# Patient Record
Sex: Male | Born: 1940 | Race: White | Hispanic: No | Marital: Married | Smoking: Former smoker
Health system: Southern US, Community
[De-identification: ages and names within clinical notes are randomized; demographics above are authoritative.]

## PROBLEM LIST (undated history)

## (undated) DIAGNOSIS — F419 Anxiety disorder, unspecified: Secondary | ICD-10-CM

## (undated) DIAGNOSIS — D869 Sarcoidosis, unspecified: Secondary | ICD-10-CM

## (undated) DIAGNOSIS — K4090 Unilateral inguinal hernia, without obstruction or gangrene, not specified as recurrent: Secondary | ICD-10-CM

## (undated) DIAGNOSIS — K635 Polyp of colon: Secondary | ICD-10-CM

## (undated) DIAGNOSIS — I2510 Atherosclerotic heart disease of native coronary artery without angina pectoris: Secondary | ICD-10-CM

## (undated) DIAGNOSIS — R0600 Dyspnea, unspecified: Secondary | ICD-10-CM

## (undated) DIAGNOSIS — Z9289 Personal history of other medical treatment: Secondary | ICD-10-CM

## (undated) DIAGNOSIS — C3490 Malignant neoplasm of unspecified part of unspecified bronchus or lung: Secondary | ICD-10-CM

## (undated) DIAGNOSIS — F4310 Post-traumatic stress disorder, unspecified: Secondary | ICD-10-CM

## (undated) DIAGNOSIS — J310 Chronic rhinitis: Secondary | ICD-10-CM

## (undated) DIAGNOSIS — R739 Hyperglycemia, unspecified: Secondary | ICD-10-CM

## (undated) DIAGNOSIS — F329 Major depressive disorder, single episode, unspecified: Secondary | ICD-10-CM

## (undated) DIAGNOSIS — K219 Gastro-esophageal reflux disease without esophagitis: Secondary | ICD-10-CM

## (undated) DIAGNOSIS — I4892 Unspecified atrial flutter: Secondary | ICD-10-CM

## (undated) DIAGNOSIS — J90 Pleural effusion, not elsewhere classified: Secondary | ICD-10-CM

## (undated) DIAGNOSIS — G8929 Other chronic pain: Secondary | ICD-10-CM

## (undated) DIAGNOSIS — G4733 Obstructive sleep apnea (adult) (pediatric): Secondary | ICD-10-CM

## (undated) DIAGNOSIS — J449 Chronic obstructive pulmonary disease, unspecified: Secondary | ICD-10-CM

## (undated) DIAGNOSIS — I7090 Unspecified atherosclerosis: Secondary | ICD-10-CM

## (undated) DIAGNOSIS — I480 Paroxysmal atrial fibrillation: Secondary | ICD-10-CM

## (undated) DIAGNOSIS — K449 Diaphragmatic hernia without obstruction or gangrene: Secondary | ICD-10-CM

## (undated) DIAGNOSIS — T380X5A Adverse effect of glucocorticoids and synthetic analogues, initial encounter: Secondary | ICD-10-CM

## (undated) DIAGNOSIS — M545 Low back pain, unspecified: Secondary | ICD-10-CM

## (undated) DIAGNOSIS — F32A Depression, unspecified: Secondary | ICD-10-CM

## (undated) DIAGNOSIS — M5090 Cervical disc disorder, unspecified, unspecified cervical region: Secondary | ICD-10-CM

## (undated) DIAGNOSIS — E785 Hyperlipidemia, unspecified: Secondary | ICD-10-CM

## (undated) DIAGNOSIS — R209 Unspecified disturbances of skin sensation: Secondary | ICD-10-CM

## (undated) DIAGNOSIS — I10 Essential (primary) hypertension: Secondary | ICD-10-CM

## (undated) DIAGNOSIS — Z9981 Dependence on supplemental oxygen: Secondary | ICD-10-CM

## (undated) DIAGNOSIS — J189 Pneumonia, unspecified organism: Secondary | ICD-10-CM

## (undated) DIAGNOSIS — I5042 Chronic combined systolic (congestive) and diastolic (congestive) heart failure: Secondary | ICD-10-CM

## (undated) DIAGNOSIS — S065X9A Traumatic subdural hemorrhage with loss of consciousness of unspecified duration, initial encounter: Secondary | ICD-10-CM

## (undated) DIAGNOSIS — M1990 Unspecified osteoarthritis, unspecified site: Secondary | ICD-10-CM

## (undated) DIAGNOSIS — A419 Sepsis, unspecified organism: Secondary | ICD-10-CM

## (undated) HISTORY — DX: Cervical disc disorder, unspecified, unspecified cervical region: M50.90

## (undated) HISTORY — DX: Chronic rhinitis: J31.0

## (undated) HISTORY — DX: Hyperlipidemia, unspecified: E78.5

## (undated) HISTORY — DX: Unspecified atherosclerosis: I70.90

## (undated) HISTORY — DX: Chronic obstructive pulmonary disease, unspecified: J44.9

## (undated) HISTORY — DX: Depression, unspecified: F32.A

## (undated) HISTORY — DX: Sarcoidosis, unspecified: D86.9

## (undated) HISTORY — DX: Gastro-esophageal reflux disease without esophagitis: K21.9

## (undated) HISTORY — PX: KNEE ARTHROSCOPY: SUR90

## (undated) HISTORY — DX: Sepsis, unspecified organism: A41.9

## (undated) HISTORY — DX: Post-traumatic stress disorder, unspecified: F43.10

## (undated) HISTORY — DX: Unilateral inguinal hernia, without obstruction or gangrene, not specified as recurrent: K40.90

## (undated) HISTORY — DX: Obstructive sleep apnea (adult) (pediatric): G47.33

## (undated) HISTORY — PX: INGUINAL HERNIA REPAIR: SUR1180

## (undated) HISTORY — DX: Traumatic subdural hemorrhage with loss of consciousness of unspecified duration, initial encounter: S06.5X9A

## (undated) HISTORY — PX: BACK SURGERY: SHX140

## (undated) HISTORY — DX: Diaphragmatic hernia without obstruction or gangrene: K44.9

## (undated) HISTORY — DX: Polyp of colon: K63.5

## (undated) HISTORY — DX: Dyspnea, unspecified: R06.00

## (undated) HISTORY — DX: Major depressive disorder, single episode, unspecified: F32.9

---

## 2001-10-28 ENCOUNTER — Ambulatory Visit (HOSPITAL_COMMUNITY): Admission: RE | Admit: 2001-10-28 | Discharge: 2001-10-28 | Payer: Self-pay | Admitting: Pulmonary Disease

## 2001-10-28 ENCOUNTER — Encounter: Payer: Self-pay | Admitting: Pulmonary Disease

## 2001-12-04 ENCOUNTER — Ambulatory Visit (HOSPITAL_COMMUNITY): Admission: RE | Admit: 2001-12-04 | Discharge: 2001-12-04 | Payer: Self-pay | Admitting: Internal Medicine

## 2002-03-18 ENCOUNTER — Emergency Department (HOSPITAL_COMMUNITY): Admission: EM | Admit: 2002-03-18 | Discharge: 2002-03-19 | Payer: Self-pay | Admitting: Emergency Medicine

## 2002-03-19 ENCOUNTER — Encounter: Payer: Self-pay | Admitting: Emergency Medicine

## 2002-12-10 ENCOUNTER — Encounter (INDEPENDENT_AMBULATORY_CARE_PROVIDER_SITE_OTHER): Payer: Self-pay | Admitting: *Deleted

## 2002-12-10 ENCOUNTER — Encounter (INDEPENDENT_AMBULATORY_CARE_PROVIDER_SITE_OTHER): Payer: Self-pay | Admitting: Specialist

## 2002-12-10 ENCOUNTER — Ambulatory Visit (HOSPITAL_COMMUNITY): Admission: RE | Admit: 2002-12-10 | Discharge: 2002-12-10 | Payer: Self-pay | Admitting: *Deleted

## 2004-05-24 ENCOUNTER — Ambulatory Visit: Payer: Self-pay | Admitting: Pulmonary Disease

## 2004-07-10 ENCOUNTER — Ambulatory Visit: Payer: Self-pay | Admitting: Pulmonary Disease

## 2004-08-24 ENCOUNTER — Ambulatory Visit: Payer: Self-pay | Admitting: Pulmonary Disease

## 2004-11-23 ENCOUNTER — Ambulatory Visit: Payer: Self-pay | Admitting: Pulmonary Disease

## 2005-04-29 ENCOUNTER — Ambulatory Visit: Payer: Self-pay | Admitting: Pulmonary Disease

## 2005-07-23 ENCOUNTER — Ambulatory Visit: Payer: Self-pay | Admitting: Pulmonary Disease

## 2005-09-01 ENCOUNTER — Encounter: Admission: RE | Admit: 2005-09-01 | Discharge: 2005-09-01 | Payer: Self-pay | Admitting: Orthopaedic Surgery

## 2005-10-25 ENCOUNTER — Ambulatory Visit: Payer: Self-pay | Admitting: Pulmonary Disease

## 2005-12-17 ENCOUNTER — Ambulatory Visit: Payer: Self-pay | Admitting: Pulmonary Disease

## 2006-03-17 ENCOUNTER — Ambulatory Visit: Payer: Self-pay | Admitting: Pulmonary Disease

## 2006-04-13 ENCOUNTER — Emergency Department (HOSPITAL_COMMUNITY): Admission: EM | Admit: 2006-04-13 | Discharge: 2006-04-13 | Payer: Self-pay | Admitting: Emergency Medicine

## 2006-05-14 ENCOUNTER — Ambulatory Visit (HOSPITAL_COMMUNITY): Admission: RE | Admit: 2006-05-14 | Discharge: 2006-05-14 | Payer: Self-pay | Admitting: Neurosurgery

## 2006-06-11 ENCOUNTER — Ambulatory Visit: Payer: Self-pay | Admitting: Pulmonary Disease

## 2006-10-13 ENCOUNTER — Ambulatory Visit: Payer: Self-pay | Admitting: Pulmonary Disease

## 2006-10-27 ENCOUNTER — Ambulatory Visit (HOSPITAL_BASED_OUTPATIENT_CLINIC_OR_DEPARTMENT_OTHER): Admission: RE | Admit: 2006-10-27 | Discharge: 2006-10-27 | Payer: Self-pay | Admitting: Pulmonary Disease

## 2006-11-03 ENCOUNTER — Ambulatory Visit: Payer: Self-pay | Admitting: Pulmonary Disease

## 2006-11-10 ENCOUNTER — Ambulatory Visit: Payer: Self-pay | Admitting: Pulmonary Disease

## 2007-02-18 ENCOUNTER — Ambulatory Visit: Payer: Self-pay | Admitting: Pulmonary Disease

## 2007-05-11 DIAGNOSIS — G4700 Insomnia, unspecified: Secondary | ICD-10-CM | POA: Insufficient documentation

## 2007-05-11 DIAGNOSIS — G4733 Obstructive sleep apnea (adult) (pediatric): Secondary | ICD-10-CM | POA: Insufficient documentation

## 2007-05-11 DIAGNOSIS — J449 Chronic obstructive pulmonary disease, unspecified: Secondary | ICD-10-CM | POA: Insufficient documentation

## 2007-06-22 ENCOUNTER — Ambulatory Visit: Payer: Self-pay | Admitting: Pulmonary Disease

## 2007-06-22 DIAGNOSIS — J310 Chronic rhinitis: Secondary | ICD-10-CM

## 2007-11-25 ENCOUNTER — Encounter: Payer: Self-pay | Admitting: Pulmonary Disease

## 2007-12-01 ENCOUNTER — Ambulatory Visit: Payer: Self-pay | Admitting: Pulmonary Disease

## 2008-03-15 ENCOUNTER — Telehealth (INDEPENDENT_AMBULATORY_CARE_PROVIDER_SITE_OTHER): Payer: Self-pay | Admitting: *Deleted

## 2008-05-03 ENCOUNTER — Ambulatory Visit: Payer: Self-pay | Admitting: Pulmonary Disease

## 2008-05-09 ENCOUNTER — Telehealth: Payer: Self-pay | Admitting: Pulmonary Disease

## 2008-05-24 ENCOUNTER — Encounter (HOSPITAL_COMMUNITY): Admission: RE | Admit: 2008-05-24 | Discharge: 2008-06-23 | Payer: Self-pay | Admitting: Pulmonary Disease

## 2008-06-24 ENCOUNTER — Encounter (HOSPITAL_COMMUNITY): Admission: RE | Admit: 2008-06-24 | Discharge: 2008-08-24 | Payer: Self-pay | Admitting: Pulmonary Disease

## 2008-07-27 ENCOUNTER — Telehealth (INDEPENDENT_AMBULATORY_CARE_PROVIDER_SITE_OTHER): Payer: Self-pay | Admitting: *Deleted

## 2008-08-11 ENCOUNTER — Encounter: Payer: Self-pay | Admitting: Pulmonary Disease

## 2008-08-12 ENCOUNTER — Encounter: Payer: Self-pay | Admitting: Pulmonary Disease

## 2008-08-25 ENCOUNTER — Encounter (HOSPITAL_COMMUNITY): Admission: RE | Admit: 2008-08-25 | Discharge: 2008-11-23 | Payer: Self-pay | Admitting: Pulmonary Disease

## 2008-09-22 ENCOUNTER — Encounter: Payer: Self-pay | Admitting: Pulmonary Disease

## 2008-10-17 ENCOUNTER — Ambulatory Visit: Payer: Self-pay | Admitting: Pulmonary Disease

## 2008-11-18 ENCOUNTER — Encounter: Payer: Self-pay | Admitting: Pulmonary Disease

## 2008-11-24 ENCOUNTER — Encounter (HOSPITAL_COMMUNITY): Admission: RE | Admit: 2008-11-24 | Discharge: 2009-02-22 | Payer: Self-pay | Admitting: Pulmonary Disease

## 2008-11-24 ENCOUNTER — Encounter: Payer: Self-pay | Admitting: Pulmonary Disease

## 2008-11-28 ENCOUNTER — Telehealth: Payer: Self-pay | Admitting: Pulmonary Disease

## 2008-12-05 ENCOUNTER — Encounter: Payer: Self-pay | Admitting: Pulmonary Disease

## 2009-01-16 ENCOUNTER — Ambulatory Visit: Payer: Self-pay | Admitting: Pulmonary Disease

## 2009-02-23 ENCOUNTER — Encounter (HOSPITAL_COMMUNITY): Admission: RE | Admit: 2009-02-23 | Discharge: 2009-05-24 | Payer: Self-pay | Admitting: Pulmonary Disease

## 2009-03-07 ENCOUNTER — Emergency Department (HOSPITAL_COMMUNITY): Admission: EM | Admit: 2009-03-07 | Discharge: 2009-03-07 | Payer: Self-pay | Admitting: Emergency Medicine

## 2009-03-22 ENCOUNTER — Ambulatory Visit: Payer: Self-pay | Admitting: Pulmonary Disease

## 2009-03-30 ENCOUNTER — Telehealth (INDEPENDENT_AMBULATORY_CARE_PROVIDER_SITE_OTHER): Payer: Self-pay | Admitting: *Deleted

## 2009-04-24 DIAGNOSIS — S065X9A Traumatic subdural hemorrhage with loss of consciousness of unspecified duration, initial encounter: Secondary | ICD-10-CM

## 2009-04-24 DIAGNOSIS — S065XAA Traumatic subdural hemorrhage with loss of consciousness status unknown, initial encounter: Secondary | ICD-10-CM

## 2009-04-24 HISTORY — PX: BRAIN SURGERY: SHX531

## 2009-04-24 HISTORY — DX: Traumatic subdural hemorrhage with loss of consciousness of unspecified duration, initial encounter: S06.5X9A

## 2009-04-24 HISTORY — DX: Traumatic subdural hemorrhage with loss of consciousness status unknown, initial encounter: S06.5XAA

## 2009-05-09 ENCOUNTER — Encounter: Admission: RE | Admit: 2009-05-09 | Discharge: 2009-05-09 | Payer: Self-pay | Admitting: Internal Medicine

## 2009-05-09 ENCOUNTER — Ambulatory Visit: Payer: Self-pay | Admitting: Internal Medicine

## 2009-05-09 ENCOUNTER — Inpatient Hospital Stay (HOSPITAL_COMMUNITY): Admission: EM | Admit: 2009-05-09 | Discharge: 2009-05-16 | Payer: Self-pay | Admitting: Emergency Medicine

## 2009-05-24 ENCOUNTER — Encounter: Admission: RE | Admit: 2009-05-24 | Discharge: 2009-05-24 | Payer: Self-pay | Admitting: Neurosurgery

## 2009-05-25 ENCOUNTER — Encounter: Payer: Self-pay | Admitting: Pulmonary Disease

## 2009-06-02 ENCOUNTER — Ambulatory Visit: Payer: Self-pay | Admitting: Pulmonary Disease

## 2009-06-06 ENCOUNTER — Encounter: Admission: RE | Admit: 2009-06-06 | Discharge: 2009-06-06 | Payer: Self-pay | Admitting: Neurosurgery

## 2009-06-08 ENCOUNTER — Encounter: Payer: Self-pay | Admitting: Pulmonary Disease

## 2009-06-24 ENCOUNTER — Encounter (HOSPITAL_COMMUNITY): Admission: RE | Admit: 2009-06-24 | Discharge: 2009-09-22 | Payer: Self-pay | Admitting: Pulmonary Disease

## 2009-07-06 ENCOUNTER — Encounter: Admission: RE | Admit: 2009-07-06 | Discharge: 2009-07-06 | Payer: Self-pay | Admitting: Neurosurgery

## 2009-08-28 ENCOUNTER — Encounter: Payer: Self-pay | Admitting: Pulmonary Disease

## 2009-09-18 ENCOUNTER — Ambulatory Visit: Payer: Self-pay | Admitting: Pulmonary Disease

## 2009-09-20 ENCOUNTER — Telehealth (INDEPENDENT_AMBULATORY_CARE_PROVIDER_SITE_OTHER): Payer: Self-pay | Admitting: *Deleted

## 2009-09-23 ENCOUNTER — Encounter (HOSPITAL_COMMUNITY): Admission: RE | Admit: 2009-09-23 | Discharge: 2009-12-22 | Payer: Self-pay | Admitting: Pulmonary Disease

## 2009-12-19 ENCOUNTER — Encounter: Admission: RE | Admit: 2009-12-19 | Discharge: 2009-12-19 | Payer: Self-pay | Admitting: Internal Medicine

## 2009-12-23 ENCOUNTER — Encounter (HOSPITAL_COMMUNITY): Admission: RE | Admit: 2009-12-23 | Discharge: 2010-03-23 | Payer: Self-pay | Admitting: Pulmonary Disease

## 2009-12-26 ENCOUNTER — Encounter: Admission: RE | Admit: 2009-12-26 | Discharge: 2009-12-26 | Payer: Self-pay | Admitting: Internal Medicine

## 2009-12-26 ENCOUNTER — Other Ambulatory Visit: Admission: RE | Admit: 2009-12-26 | Discharge: 2009-12-26 | Payer: Self-pay | Admitting: Interventional Radiology

## 2010-01-11 ENCOUNTER — Encounter: Payer: Self-pay | Admitting: Pulmonary Disease

## 2010-02-22 ENCOUNTER — Encounter: Payer: Self-pay | Admitting: Pulmonary Disease

## 2010-03-16 ENCOUNTER — Ambulatory Visit: Payer: Self-pay | Admitting: Pulmonary Disease

## 2010-04-02 ENCOUNTER — Telehealth (INDEPENDENT_AMBULATORY_CARE_PROVIDER_SITE_OTHER): Payer: Self-pay | Admitting: *Deleted

## 2010-07-24 NOTE — Miscellaneous (Signed)
Summary: Pulmonary/[place]  [facility]   Imported By: Sherian Rein 09/14/2009 12:01:53  _____________________________________________________________________  External Attachment:    Type:   Image     Comment:   External Document

## 2010-07-24 NOTE — Progress Notes (Signed)
Summary: rx correction  Phone Note Call from Patient Call back at Home Phone [phone]   Caller: Patient Call For: sood Summary of Call: pt states that the rx for symbicort is incorrect (pt was seen yesterday).  says he has always taken 2 puffs in the am and 2 puffs in the pm. says dr Craige Cotta didn't mention changing this. requests that this be corrected asap. same pharmacy. Initial call taken by: Tivis Ringer, CNA,  September 20, 2009 4:08 PM  Follow-up for Phone Call        pt states he recently saw [facility] 09-18-2009.  Rx was sent for Symbicort but directions state to take 2 puffs at bedtime.  Pt states he has always taken 2 puffs two times a day.  Please advise.  Thank you.Arman Filter LPN  September 20, 2009 4:21 PM   Additional Follow-up for Phone Call Additional follow up Details #1::        In Sept. 2010 he was on study medication for his COPD.  At that time he was on symbicort two puffs at bedtime, spiriva once daily, and study medication.    If he is using symbicort two puffs two times a day now that is fine, as he has improvement in his breathing.  Please make correction in his medication list, and inform patient that his medication list has been corrected. Additional Follow-up by: Coralyn Helling MD,  September 21, 2009 4:26 PM     Appended Document: rx correction Medications Added SYMBICORT 160-4.5 MCG/ACT AERO (BUDESONIDE-FORMOTEROL FUMARATE) two puffs twice daily          Clinical Lists Changes  Medications: Changed medication from SYMBICORT 160-4.5 MCG/ACT AERO (BUDESONIDE-FORMOTEROL FUMARATE) two puffs at bedtime to SYMBICORT 160-4.5 MCG/ACT AERO (BUDESONIDE-FORMOTEROL FUMARATE) two puffs twice daily - Signed Rx of SYMBICORT 160-4.5 MCG/ACT AERO (BUDESONIDE-FORMOTEROL FUMARATE) two puffs twice daily;  #3 x 3;  Signed;  Entered by: Carron Curie CMA;  Authorized by: Coralyn Helling MD;  Method used: [facility]  [address] [phone]*, [address]  [place], [place]  [address], Ph: [phone] or [phone], Fax: [phone]    Prescriptions: SYMBICORT 160-4.5 MCG/ACT AERO (BUDESONIDE-FORMOTEROL FUMARATE) two puffs twice daily  #3 x 3   Entered by:   Carron Curie CMA   Authorized by:   Coralyn Helling MD   Signed by:   Carron Curie CMA on 09/21/2009   Method used:   [facility]        [facility]  [address] [phone]* (retail)       [address]       [place], [place]  [address]       Ph: [phone] or [phone]       Fax: [phone]   RxID:   [id number]   pt advised that symbicort should be 2 puffs twice daily. New rx sent to pharmacy. Carron Curie CMA  September 21, 2009 4:46 PM

## 2010-07-24 NOTE — Assessment & Plan Note (Signed)
Summary: f/u ///kp   Visit Type:  Follow-up Copy to:  Dr. Romeo Apple Primary Ivylynn Hoppes/Referring Carmelle Bamberg:  Dr. Darcus Austin  CC:  COPD...the patient c/o cough with yellow mucus...increased sob...headaches.  History of Present Illness: I saw Travis Ferguson in f/u today for his COPD, insomnia, and mild OSA.  Overall he has been doing okay.  He has a cough with clear sputum.  He still gets occasional headaches.  He has also developed a tremor in his left hand.  He is no longer going to pulmonary rehab due to scheduling conflicts.  There has been on change to his sleeping pattern.  Current Medications (verified): 1)  Symbicort 160-4.5 Mcg/act Aero (Budesonide-Formoterol Fumarate) .... Two Puffs Twice Daily 2)  Proventil Hfa 108 (90 Base) Mcg/act  Aers (Albuterol Sulfate) .[name].. 1-2 Puffs Every 4-6 Hours As Needed 3)  Spiriva Handihaler 18 Mcg  Caps (Tiotropium Bromide Monohydrate) .[name].. 1 Capsule Once Daily 4)  Fluoxetine Hcl 40 Mg Caps (Fluoxetine Hcl) .... Take 1 Tablet By Mouth Once A Day 5)  Vytorin 10-40 Mg  Tabs (Ezetimibe-Simvastatin) .... Take 1 Tablet By Mouth Once A Day 6)  Fish Oil   Oil (Fish Oil) .... Take 3600mg   Tablet By Mouth Once A Day 7)  Metoprolol Succinate 25 Mg  Tb24 (Metoprolol Succinate) .... Take 1 Tablet By Mouth Once A Day 8)  Mucinex 600 Mg  Tb12 (Guaifenesin) .... As Needed 9)  Tylenol Extra Strength 500 Mg Tabs (Acetaminophen) .... Take 1 Tablet By Mouth Every 6 Hours As Needed 10)  Multivitamins   Tabs (Multiple Vitamin) .... Take 1 Tablet By Mouth Once A Day 11)  Losartan Potassium 25 Mg Tabs (Losartan Potassium) .... 1/2 By Mouth Daily  Allergies (verified): No Known Drug Allergies  Past History:  Past Medical History: Reviewed history from 06/02/2009 and no changes required. COPD Depression PTSD Mild OSA      - PSG 05/08 AHI 8 Migraines GERD Dyslipidemia Rhinitis Cervical disk disease Subdural hematoma Nov. 2010  Past Surgical History: Reviewed history  from 06/02/2009 and no changes required. Rt frontal craniotomy Nov 2010  Review of Systems       The patient complains of shortness of breath with activity, productive cough, non-productive cough, and headaches.  The patient denies shortness of breath at rest, coughing up blood, chest pain, abdominal pain, difficulty swallowing, sore throat, nasal congestion/difficulty breathing through nose, hand/feet swelling, rash, change in color of mucus, and fever.    Vital Signs:  Patient profile:   70 year old male Height:      72 inches (182.88 cm) Weight:      262 pounds (119.09 kg) BMI:     35.66 O2 Sat:      95 % on Room air Temp:     98.1 degrees F (36.72 degrees C) oral Pulse rate:   45 / minute BP sitting:   118 / 62  (left arm) Cuff size:   regular  Vitals Entered By: Michel Bickers CMA (March 16, 2010 4:54 PM)  O2 Sat at Rest %:  95 O2 Flow:  Room air CC: COPD...the patient c/o cough with yellow mucus...increased sob...headaches Comments Medications reviewed with patient Daytime phone verified. Michel Bickers [facility]  March 16, 2010 4:55 PM   Physical Exam  General:  normal appearance and obese.   Nose:  clear drainage, no tenderness Mouth:  no oral lesion Neck:  no JVD.   Lungs:  Decreased breath sounds, no wheezing, no dullness Heart:  regular rhythm, normal  rate, and no murmurs.   Extremities:  no edema Neurologic:  normal CN II-XII and strength normal.   Cervical Nodes:  no significant adenopathy   Impression & Recommendations:  Problem # 1:  COPD (ICD-496)  He is to continue his current inhaler regimen.  Will determine if he can restart pulmonary rehab maintenance program.  Administered flu shot today.  Problem # 2:  CHRONIC RHINITIS (ICD-472.0)  Stable.  Problem # 3:  OBSTRUCTIVE SLEEP APNEA (ICD-327.23)  Continue clinical monitoring.  Complete Medication List: 1)  Symbicort 160-4.5 Mcg/act Aero (Budesonide-formoterol fumarate) .... Two puffs twice  daily 2)  Proventil Hfa 108 (90 Base) Mcg/act Aers (Albuterol sulfate) .[name].. 1-2 puffs every 4-6 hours as needed 3)  Spiriva Handihaler 18 Mcg Caps (Tiotropium bromide monohydrate) .[name].. 1 capsule once daily 4)  Fluoxetine Hcl 40 Mg Caps (Fluoxetine hcl) .... Take 1 tablet by mouth once a day 5)  Vytorin 10-40 Mg Tabs (Ezetimibe-simvastatin) .... Take 1 tablet by mouth once a day 6)  Fish Oil Oil (Fish oil) .... Take 3600mg   tablet by mouth once a day 7)  Metoprolol Succinate 25 Mg Tb24 (Metoprolol succinate) .... Take 1 tablet by mouth once a day 8)  Mucinex 600 Mg Tb12 (Guaifenesin) .... As needed 9)  Tylenol Extra Strength 500 Mg Tabs (Acetaminophen) .... Take 1 tablet by mouth every 6 hours as needed 10)  Multivitamins Tabs (Multiple vitamin) .... Take 1 tablet by mouth once a day 11)  Losartan Potassium 25 Mg Tabs (Losartan potassium) .... 1/2 by mouth daily  Other Orders: Influenza Vaccine MCR (62952) Est. Patient Level III (84132)  Patient Instructions: 1)  Follow up in 4 months   Immunizations Administered:  Influenza Vaccine # 1:    Vaccine Type: Fluvax MCR    Site: left deltoid    Mfr: [employer]    Dose: 0.5 ml    Route: IM    Given by: Zackery Barefoot CMA    Exp. Date: 12/22/2010    Lot #: [id number]    VIS given: 01/16/10 version given March 16, 2010.  Flu Vaccine Consent Questions:    Do you have a history of severe allergic reactions to this vaccine? no    Any prior history of allergic reactions to egg and/or gelatin? no    Do you have a sensitivity to the preservative Thimersol? no    Do you have a past history of Guillan-Barre Syndrome? no    Do you currently have an acute febrile illness? no    Have you ever had a severe reaction to latex? no    Vaccine information given and explained to patient? yes

## 2010-07-24 NOTE — Procedures (Signed)
Summary: Colonoscopy   Colonoscopy  Procedure date:  12/10/2002  Findings:      Location:  [facility].    Procedures Next Due Date:    Colonoscopy: 12/2007 NAME:  Travis Ferguson, Travis Ferguson                          ACCOUNT NO.:  [id number]   MEDICAL RECORD NO.:  [id number]                   PATIENT TYPE:  AMB   LOCATION:  ENDO                                 FACILITY:  [facility]   PHYSICIAN:  Georgiana Spinner, M.D.                 DATE OF BIRTH:  [birth year]   DATE OF PROCEDURE:  12/10/2002  DATE OF DISCHARGE:                                 OPERATIVE REPORT   PROCEDURE:  Colonoscopy.   INDICATIONS:  Colon polyps.   ANESTHESIA:  1. Demerol 20 mg.  2. Versed 2 mg.   DESCRIPTION OF PROCEDURE:  With the patient mildly sedated in the left  lateral decubitus position, the Olympus videoscopic colonoscope was inserted  in the rectum and passed under direct vision to the cecum, identified by the  ileocecal valve and appendiceal orifice, both of which were photographed.  From this point, the colonoscope was slowly withdrawn, taking  circumferential views of the entire colonic mucosa, stopping only to  photograph diverticula seen along the way in the sigmoid colon until we  reached the rectum which appeared normal on direct and retroflexed view.  The endoscope was straightened and withdrawn.  The patient's vital signs and  pulse oximeter remained stable.  The patient tolerated the procedure well  without apparent complications.   FINDINGS:  Diverticulosis of sigmoid colon.  Otherwise, an unremarkable  exam.   PLAN:  Possibly repeat examination in five years.                                               Georgiana Spinner, M.D.    GMO/MEDQ  D:  12/10/2002  T:  12/10/2002  Job:  [id number]   cc:   Loraine Leriche A. Waynard Edwards, M.D.  [address]  [place]  [place] [address]  Fax: [phone]

## 2010-07-24 NOTE — Miscellaneous (Signed)
Summary: Pulmonary/[place]  Pulmonary/   Imported By: Sherian Rein 01/18/2010 08:11:50  _____________________________________________________________________  External Attachment:    Type:   Image     Comment:   External Document

## 2010-07-24 NOTE — Procedures (Signed)
Summary: EGD & Pathology   EGD  Procedure date:  12/10/2002  Findings:      Location: [facility]   NAME:  Travis Ferguson, Travis Ferguson                          ACCOUNT NO.:  [id number]   MEDICAL RECORD NO.:  [id number]                   PATIENT TYPE:  AMB   LOCATION:  ENDO                                 FACILITY:  [facility]   PHYSICIAN:  Georgiana Spinner, M.D.                 DATE OF BIRTH:  Nov 03, 1940   DATE OF PROCEDURE:  12/10/2002  DATE OF DISCHARGE:                                 OPERATIVE REPORT   PROCEDURE:  Upper endoscopy.   INDICATIONS:  GERD.   ANESTHESIA:  1. Demerol 60 mg.  2. Versed 6 mg.   DESCRIPTION OF PROCEDURE:  With patient mildly sedated in the left lateral  decubitus position, the Olympus videoscopic endoscope was inserted in the  mouth, passed under direct vision through the esophagus, which showed clear  changes of Barrett's that were photographed.  We entered into the stomach.  Fundus, body, antrum, duodenal bulb, second portion of duodenum all appeared  normal.  From this point, the endoscope was slowly withdrawn, taking  circumferential views of the duodenal mucosa until the endoscope then pulled  back into the stomach, placed in retroflexion to view the stomach from  below.  The endoscope was then straightened and withdrawn, taking  circumferential views of the remaining gastric and esophageal mucosa,  stopping in the distal esophagus to photograph and biopsy the areas of  Barrett's.  The patient's vital signs and pulse oximeter remained stable.  The patient tolerated the procedure well without apparent complications.   FINDINGS:  Clear endoscopic evidence of Barrett's esophagus, photographed  and biopsied.  Await biopsy report.  The patient will call me for results  and follow up with me as an outpatient.  Proceed to colonoscopy as planned.                                               Georgiana Spinner, M.D.    GMO/MEDQ  D:  12/10/2002  T:   12/10/2002  Job:  [id number]   cc:   Loraine Leriche A. Waynard Edwards, M.D.  [address]  [place]  [place] [address]  Fax: [phone]    [facility] Surgical Pathology - STATUS: Final             By: Morrie Sheldon,       Perform Date: [id number] 00:00  Ordered By: Jarvis Newcomer,            Ordered Date: [phone] 12:07  Facility: [facility]  Department: CPATH  Service Report Text  [facility]   [address] [place], [place] [address]   [phone]    REPORT OF SURGICAL PATHOLOGY    Case #: [id number]   Patient Name: Travis Ferguson, Travis Ferguson   PID: [id number]   Pathologist: Beulah Gandy. Luisa Hart, MD   DOB/Age 18-Sep-1940 (Age: 38) Gender: M   Date Taken: 12/10/2002   Date Received: 12/10/2002    FINAL DIAGNOSIS    ***MICROSCOPIC EXAMINATION AND DIAGNOSIS***    ESOPHAGUS, BIOPSIES: GASTROESOPHAGEAL JUNCTION MUCOSA WITH MILD   INFLAMMATION CONSISTENT WITH GASTROESOPHAGEAL REFLUX. NO   INTESTINAL METAPLASIA, DYSPLASIA OR MALIGNANCY IDENTIFIED.    COMMENT   An Alcian Blue stain is performed to determine the presence of   intestinal metaplasia. No intestinal metaplasia is identified   with the Alcian Blue stain. The control stained appropriately.    mw   Date Reported: 12/13/2002 Beulah Gandy. Luisa Hart, MD   *** Electronically Signed Out By JDP ***    Clinical information   R/o Barrett' s. (lb)    specimen(s) obtained   Esophagus, biopsy,distal    Gross Description   Received in formalin are tan, soft tissue fragments that are   submitted in toto. Number: 3   Size: 0.2 to 0.3 cm. (GP:jy) 12/10/02    jy/

## 2010-07-24 NOTE — Assessment & Plan Note (Signed)
Summary: 4-6 months/apc   Copy to:  Dr. Romeo Apple Primary Provider/Referring Provider:  Dr. Darcus Austin  CC:  COPD. OSA. The patient c/o cough with yellow mucus x1 week.[name]  History of Present Illness: I saw Travis Ferguson in f/u today for his COPD, insomnia, and mild OSA.  He has been having more sinus congestion [place]  He has been using nasal irrigation, and this has helped.  He has also been using mucinex.  His chest has been fine, and he denies fever or wheezing.  He has not needed to use proventil.   Current Medications (verified): 1)  Symbicort 160-4.5 Mcg/act Aero (Budesonide-Formoterol Fumarate) .... Two Puffs At Bedtime 2)  Proventil Hfa 108 (90 Base) Mcg/act  Aers (Albuterol Sulfate) .[name].. 1-2 Puffs Every 4-6 Hours As Needed 3)  Spiriva Handihaler 18 Mcg  Caps (Tiotropium Bromide Monohydrate) .[name].. 1 Capsule Once Daily 4)  Fluoxetine Hcl 40 Mg Caps (Fluoxetine Hcl) .... Take 1 Tablet By Mouth Once A Day 5)  Vytorin 10-40 Mg  Tabs (Ezetimibe-Simvastatin) .... Take 1 Tablet By Mouth Once A Day 6)  Fish Oil   Oil (Fish Oil) .... Take 3600mg   Tablet By Mouth Once A Day 7)  Metoprolol Succinate 25 Mg  Tb24 (Metoprolol Succinate) .... Take 1 Tablet By Mouth Once A Day 8)  Mucinex 600 Mg  Tb12 (Guaifenesin) .... As Needed 9)  Tylenol Extra Strength 500 Mg Tabs (Acetaminophen) .... Take 1 Tablet By Mouth Every 6 Hours As Needed 10)  Multivitamins   Tabs (Multiple Vitamin) .... Take 1 Tablet By Mouth Once A Day 11)  Losartan Potassium 25 Mg Tabs (Losartan Potassium) .... 1/2 By Mouth Daily  Allergies (verified): No Known Drug Allergies  Past History:  Past Medical History: Last updated: 06/02/2009 COPD Depression PTSD Mild OSA      - PSG 05/08 AHI 8 Migraines GERD Dyslipidemia Rhinitis Cervical disk disease Subdural hematoma Nov. 2010  Past Surgical History: Last updated: 06/02/2009 Rt frontal craniotomy Nov 2010  Vital Signs:  Patient  profile:   69 year old male Height:      72 inches (182.88 cm) Weight:      249 pounds (113.18 kg) BMI:     33.89 O2 Sat:      94 % on Room air Temp:     97.8 degrees F (36.56 degrees C) oral Pulse rate:   77 / minute BP sitting:   122 / 80  (left arm) Cuff size:   large  Vitals Entered By: Michel Bickers CMA (September 18, 2009 1:42 PM)  O2 Sat at Rest %:  94 O2 Flow:  Room air  Physical Exam  Nose:  boggy mucosa, clear drainage, no tenderness Mouth:  no oral lesion Neck:  no JVD.   Lungs:  Decreased breath sounds, no wheezing Heart:  regular rhythm, normal rate, and no murmurs.   Extremities:  no edema Cervical Nodes:  no significant adenopathy   Impression & Recommendations:  Problem # 1:  COPD (ICD-496)  This is stable.  Will continue his current inhaler regimen.  Problem # 2:  CHRONIC RHINITIS (ICD-472.0) He likely has an acute viral upper respiratory infection.  Will continue symptomatic therapy and nasal irrigation.  I don't think he needs antibiotics at this time.  Problem # 3:  INSOMNIA (ICD-780.52)  Likely related to his PTSD which is now worsened by recent hospitalization.  Problem # 4:  OBSTRUCTIVE SLEEP APNEA (ICD-327.23)  Continue clinical observation.  Medications Added to Medication List This Visit: 1)  Losartan Potassium 25 Mg Tabs (Losartan potassium) .... 1/2 by mouth daily  Complete Medication List: 1)  Symbicort 160-4.5 Mcg/act Aero (Budesonide-formoterol fumarate) .... Two puffs at bedtime 2)  Proventil Hfa 108 (90 Base) Mcg/act Aers (Albuterol sulfate) .[name].. 1-2 puffs every 4-6 hours as needed 3)  Spiriva Handihaler 18 Mcg Caps (Tiotropium bromide monohydrate) .[name].. 1 capsule once daily 4)  Fluoxetine Hcl 40 Mg Caps (Fluoxetine hcl) .... Take 1 tablet by mouth once a day 5)  Vytorin 10-40 Mg Tabs (Ezetimibe-simvastatin) .... Take 1 tablet by mouth once a day 6)  Fish Oil Oil (Fish oil) .... Take 3600mg   tablet by mouth once a day 7)  Metoprolol  Succinate 25 Mg Tb24 (Metoprolol succinate) .... Take 1 tablet by mouth once a day 8)  Mucinex 600 Mg Tb12 (Guaifenesin) .... As needed 9)  Tylenol Extra Strength 500 Mg Tabs (Acetaminophen) .... Take 1 tablet by mouth every 6 hours as needed 10)  Multivitamins Tabs (Multiple vitamin) .... Take 1 tablet by mouth once a day 11)  Losartan Potassium 25 Mg Tabs (Losartan potassium) .... 1/2 by mouth daily  Other Orders: Est. Patient Level III ([address])  Patient Instructions: 1)  Follow up in 4 to 6 months Prescriptions: SPIRIVA HANDIHALER 18 MCG  CAPS (TIOTROPIUM BROMIDE MONOHYDRATE) 1 capsule once daily  #90 x 3   Entered and Authorized by:   Coralyn Helling MD   Signed by:   Coralyn Helling MD on 09/18/2009   Method used:   [facility] [address] [phone][facility] ([facility]ail[facility]       [address]       [place], [place]  [address]       Ph: [phone] or [phone]       Fax: [phone]   RxID:   [id number] PROVENTIL HFA 108 (90 BASE) MCG/ACT  AERS (ALBUTEROL SULFATE) 1-2 puffs every 4-6 hours as needed  #3 x 3   Entered and Authorized by:   Coralyn Helling MD   Signed by:   Coralyn Helling MD on 09/18/2009   Method used:   [facility]        [facility] [address] [phone]* (retail)       [address]       [place], [place]  [address]       Ph: [phone] or [phone]       Fax: [phone]   RxID:   [id number] SYMBICORT 160-4.5 MCG/ACT AERO (BUDESONIDE-FORMOTEROL FUMARATE) two puffs at bedtime  #3 x 3   Entered and Authorized by:   Coralyn Helling MD   Signed by:   Coralyn Helling MD on 09/18/2009   Method used:   [facility]        [facility] [address] [phone]* (retail)       [address]       [place], [place]  [address]       Ph: [phone] or [phone]       Fax: [phone]   RxID:   [id number]

## 2010-07-24 NOTE — Letter (Signed)
Summary: Attendance  Attendance   Imported By: Lester [place] 03/05/2010 08:07:59  _____________________________________________________________________  External Attachment:    Type:   Image     Comment:   External Document

## 2010-07-24 NOTE — Progress Notes (Signed)
Summary: pulm rehab    Phone Note Call from Patient Call back at Home Phone [phone]   Caller: Patient Call For: SOOD Summary of Call: pt wants to know if dr sood had received an answer yet re: pulm rehab program.  Initial call taken by: Tivis Ringer, CNA,  April 02, 2010 12:10 PM  Follow-up for Phone Call        [facility].  Aundra Millet Reynolds LPN  April 02, 2010 1:38 PM   The patient is wanting to restart pulmonary rehab in the maintenance program. He had dropped out due to personal family issues and says that the ladies at rehab had basically asked him to drop out. He would like to know if this is their normal procedure when pt's need to miss a few sessions and are paying out of pocket for this service. I will forward to PCC's to see if they have any information regarding this. Follow-up by: Michel Bickers CMA,  April 02, 2010 2:38 PM  Additional Follow-up for Phone Call Additional follow up Details #1::        Per Boneta Lucks at [facility] rehab, the maintenance program is 3 months in length and both he and his wife had reached their 3 months. They were not discharged solely because of infrequent attendance. They may reapply for rehab after 6 months if they have had an excerbation. She did ask if they had continued with any exercise at the [id number]. Additional Follow-up by: Michel Bickers CMA,  April 02, 2010 2:51 PM    Additional Follow-up for Phone Call Additional follow up Details #2::    Called, spoke with pt.  He was informed maintance program is 3months in length and that's why his time is up.  Also advised that he can reaply in 6 months if he has had an exacterbaction.  Pt states he is not continues with any exercise.  Will forward message to VS so he is aware.    Coralyn Helling MD  April 04, 2010 9:22 AM  Follow-up by: Gweneth Dimitri RN,  April 03, 2010 5:49 PM

## 2010-08-02 ENCOUNTER — Ambulatory Visit (INDEPENDENT_AMBULATORY_CARE_PROVIDER_SITE_OTHER): Payer: MEDICARE | Admitting: Pulmonary Disease

## 2010-08-02 ENCOUNTER — Encounter: Payer: Self-pay | Admitting: Pulmonary Disease

## 2010-08-02 DIAGNOSIS — J449 Chronic obstructive pulmonary disease, unspecified: Secondary | ICD-10-CM

## 2010-08-02 DIAGNOSIS — J310 Chronic rhinitis: Secondary | ICD-10-CM

## 2010-08-09 NOTE — Assessment & Plan Note (Signed)
Summary: rov/sh   Copy to:  Dr. Romeo Apple Primary Provider/Referring Provider:  Dr. Darcus Austin  CC:  follow up. Pt states he has had some labored breathing. Pt states he had some bright red blood in his mucus when he blew his nose this morning. Pt c/o wheezing.  History of Present Illness: I saw Travis Ferguson in f/u today for his COPD, insomnia, and mild OSA.  He has been getting occasional nose bleeds.  Otherwise his breathing has been stable.  He has occasional cough, but not much wheeze or sputum.      Current Medications (verified): 1)  Symbicort 160-4.5 Mcg/act Aero (Budesonide-Formoterol Fumarate) .... Two Puffs Twice Daily 2)  Proventil Hfa 108 (90 Base) Mcg/act  Aers (Albuterol Sulfate) .[name].. 1-2 Puffs Every 4-6 Hours As Needed 3)  Spiriva Handihaler 18 Mcg  Caps (Tiotropium Bromide Monohydrate) .[name].. 1 Capsule Once Daily 4)  Fluoxetine Hcl 40 Mg Caps (Fluoxetine Hcl) .... Take 1 Tablet By Mouth Once A Day 5)  Vytorin 10-40 Mg  Tabs (Ezetimibe-Simvastatin) .... Take 1 Tablet By Mouth Once A Day 6)  Fish Oil   Oil (Fish Oil) .... Take 3600mg   Tablet By Mouth Once A Day 7)  Metoprolol Succinate 25 Mg  Tb24 (Metoprolol Succinate) .... Take 1 Tablet By Mouth Once A Day 8)  Mucinex 600 Mg  Tb12 (Guaifenesin) .... As Needed 9)  Tylenol Extra Strength 500 Mg Tabs (Acetaminophen) .... Take 1 Tablet By Mouth Every 6 Hours As Needed 10)  Multivitamins   Tabs (Multiple Vitamin) .... Take 1 Tablet By Mouth Once A Day 11)  Losartan Potassium 25 Mg Tabs (Losartan Potassium) .... 1/2 By Mouth Daily  Allergies (verified): No Known Drug Allergies  Past History:  Past Medical History: Last updated: 06/02/2009 COPD Depression PTSD Mild OSA      - PSG 05/08 AHI 8 Migraines GERD Dyslipidemia Rhinitis Cervical disk disease Subdural hematoma Nov. 2010  Past Surgical History: Last updated: 06/02/2009 Rt frontal craniotomy Nov 2010  Vital Signs:  Patient profile:   70 year old  male Height:      72 inches Weight:      260.25 pounds BMI:     35.42 O2 Sat:      91 % on Room air Temp:     97.8 degrees F oral Pulse rate:   88 / minute BP sitting:   132 / 70  (left arm) Cuff size:   large  Vitals Entered By: Carver Fila (August 02, 2010 9:03 AM)  O2 Flow:  Room air  Physical Exam  General:  normal appearance and obese.   Nose:  clear drainage, no tenderness Mouth:  no oral lesion Neck:  no JVD.   Lungs:  Decreased breath sounds, no wheezing, no dullness Heart:  regular rhythm, normal rate, and no murmurs.   Extremities:  no edema Neurologic:  normal CN II-XII and strength normal.   Cervical Nodes:  no significant adenopathy Psych:  alert and cooperative; normal mood and affect; normal attention span and concentration   Impression & Recommendations:  Problem # 1:  COPD (ICD-496) Stable on current regimen.  Problem # 2:  CHRONIC RHINITIS (ICD-472.0) Advised to use nasal irrigation.  If symptoms progress he may need additional therapy.  Complete Medication List: 1)  Symbicort 160-4.5 Mcg/act Aero (Budesonide-formoterol fumarate) .... Two puffs twice daily 2)  Proventil Hfa 108 (90 Base) Mcg/act Aers (Albuterol sulfate) .[name].. 1-2 puffs every 4-6 hours as needed 3)  Spiriva Handihaler 18 Mcg  Caps (Tiotropium bromide monohydrate) .[name].. 1 capsule once daily 4)  Fluoxetine Hcl 40 Mg Caps (Fluoxetine hcl) .... Take 1 tablet by mouth once a day 5)  Vytorin 10-40 Mg Tabs (Ezetimibe-simvastatin) .... Take 1 tablet by mouth once a day 6)  Fish Oil Oil (Fish oil) .... Take 3600mg   tablet by mouth once a day 7)  Metoprolol Succinate 25 Mg Tb24 (Metoprolol succinate) .... Take 1 tablet by mouth once a day 8)  Mucinex 600 Mg Tb12 (Guaifenesin) .... As needed 9)  Tylenol Extra Strength 500 Mg Tabs (Acetaminophen) .... Take 1 tablet by mouth every 6 hours as needed 10)  Multivitamins Tabs (Multiple vitamin) .... Take 1 tablet by mouth once a day 11)  Losartan  Potassium 25 Mg Tabs (Losartan potassium) .... 1/2 by mouth daily  Other Orders: Est. Patient Level III (84696)  Patient Instructions: 1)  Follow up in 4 to 6 months Prescriptions: SPIRIVA HANDIHALER 18 MCG  CAPS (TIOTROPIUM BROMIDE MONOHYDRATE) 1 capsule once daily  #90 x 3   Entered and Authorized by:   Coralyn Helling MD   Signed by:   Coralyn Helling MD on 08/02/2010   Method used:   Electronically to        [facility] [address] [phone]* (retail)       [address]       [place], [place]  [address]       Ph: [phone] or [phone]       Fax: [phone]   RxID:   [id number] SYMBICORT 160-4.5 MCG/ACT AERO (BUDESONIDE-FORMOTEROL FUMARATE) two puffs twice daily  #3 x 3   Entered and Authorized by:   Coralyn Helling MD   Signed by:   Coralyn Helling MD on 08/02/2010   Method used:   [facility]ically [facility]        [facility] [address] [phone]* (retail)       [address]       [place], [place]  [address]       Ph: [phone] or [phone]       Fax: [phone]   RxID:   [id number]

## 2010-09-26 LAB — BLOOD GAS, ARTERIAL
Acid-base deficit: 3.7 mmol/L — ABNORMAL HIGH (ref 0.0–2.0)
Drawn by: 28337
FIO2: 0.3 %
MECHVT: 600 mL
O2 Saturation: 95.8 %
PEEP: 5 cmH2O
Patient temperature: 98.6
Patient temperature: 98.6
Pressure support: 5 cmH2O
RATE: 14 resp/min
pCO2 arterial: 36.6 mmHg (ref 35.0–45.0)
pH, Arterial: 7.375 (ref 7.350–7.450)

## 2010-09-26 LAB — COMPREHENSIVE METABOLIC PANEL
AST: 18 U/L (ref 0–37)
Albumin: 2.4 g/dL — ABNORMAL LOW (ref 3.5–5.2)
Calcium: 7.1 mg/dL — ABNORMAL LOW (ref 8.4–10.5)
Creatinine, Ser: 0.8 mg/dL (ref 0.4–1.5)
GFR calc Af Amer: >60 mL/min (ref >60)
GFR calc non Af Amer: >60 mL/min (ref >60)

## 2010-09-26 LAB — MAGNESIUM: Magnesium: 2 mg/dL (ref 1.5–2.5)

## 2010-09-26 LAB — BASIC METABOLIC PANEL
BUN: 10 mg/dL (ref 6–23)
BUN: 16 mg/dL (ref 6–23)
CO2: 21 mEq/L (ref 19–32)
CO2: 23 mEq/L (ref 19–32)
Calcium: 7.7 mg/dL — ABNORMAL LOW (ref 8.4–10.5)
Chloride: 108 mEq/L (ref 96–112)
Chloride: 108 mEq/L (ref 96–112)
Chloride: 112 mEq/L (ref 96–112)
Creatinine, Ser: 0.86 mg/dL (ref 0.4–1.5)
GFR calc non Af Amer: >60 mL/min (ref >60)
GFR calc non Af Amer: >60 mL/min (ref >60)
Glucose, Bld: 105 mg/dL — ABNORMAL HIGH (ref 70–99)
Glucose, Bld: 113 mg/dL — ABNORMAL HIGH (ref 70–99)
Glucose, Bld: 140 mg/dL — ABNORMAL HIGH (ref 70–99)
Potassium: 4.1 mEq/L (ref 3.5–5.1)
Potassium: 4.2 mEq/L (ref 3.5–5.1)
Potassium: 4.5 mEq/L (ref 3.5–5.1)
Sodium: 137 mEq/L (ref 135–145)
Sodium: 138 mEq/L (ref 135–145)

## 2010-09-26 LAB — GLUCOSE, CAPILLARY
Glucose-Capillary: 100 mg/dL — ABNORMAL HIGH (ref 70–99)
Glucose-Capillary: 102 mg/dL — ABNORMAL HIGH (ref 70–99)
Glucose-Capillary: 107 mg/dL — ABNORMAL HIGH (ref 70–99)
Glucose-Capillary: 109 mg/dL — ABNORMAL HIGH (ref 70–99)
Glucose-Capillary: 110 mg/dL — ABNORMAL HIGH (ref 70–99)
Glucose-Capillary: 110 mg/dL — ABNORMAL HIGH (ref 70–99)
Glucose-Capillary: 110 mg/dL — ABNORMAL HIGH (ref 70–99)
Glucose-Capillary: 113 mg/dL — ABNORMAL HIGH (ref 70–99)
Glucose-Capillary: 116 mg/dL — ABNORMAL HIGH (ref 70–99)
Glucose-Capillary: 117 mg/dL — ABNORMAL HIGH (ref 70–99)
Glucose-Capillary: 125 mg/dL — ABNORMAL HIGH (ref 70–99)
Glucose-Capillary: 136 mg/dL — ABNORMAL HIGH (ref 70–99)
Glucose-Capillary: 146 mg/dL — ABNORMAL HIGH (ref 70–99)

## 2010-09-26 LAB — CBC
HCT: 29.4 % — ABNORMAL LOW (ref 39.0–52.0)
HCT: 34.3 % — ABNORMAL LOW (ref 39.0–52.0)
Hemoglobin: 10.1 g/dL — ABNORMAL LOW (ref 13.0–17.0)
Hemoglobin: 12 g/dL — ABNORMAL LOW (ref 13.0–17.0)
MCHC: 34.4 g/dL (ref 30.0–36.0)
MCHC: 34.7 g/dL (ref 30.0–36.0)
MCHC: 34.9 g/dL (ref 30.0–36.0)
MCHC: 35 g/dL (ref 30.0–36.0)
MCV: 87.4 fL (ref 78.0–100.0)
MCV: 88.7 fL (ref 78.0–100.0)
Platelets: 200 10*3/uL (ref 150–400)
Platelets: 231 10*3/uL (ref 150–400)
Platelets: 275 10*3/uL (ref 150–400)
RBC: 5.08 MIL/uL (ref 4.22–5.81)
RDW: 12.9 % (ref 11.5–15.5)
RDW: 13.6 % (ref 11.5–15.5)
WBC: 10.4 10*3/uL (ref 4.0–10.5)

## 2010-09-26 LAB — DIFFERENTIAL
Basophils Relative: 0 % (ref 0–1)
Eosinophils Absolute: 0.2 10*3/uL (ref 0.0–0.7)
Eosinophils Relative: 2 % (ref 0–5)
Lymphocytes Relative: 16 % (ref 12–46)
Neutro Abs: 7.5 10*3/uL (ref 1.7–7.7)
Neutrophils Relative %: 73 % (ref 43–77)

## 2010-09-26 LAB — PROTIME-INR
INR: 1.18 (ref 0.00–1.49)
INR: 1.26 (ref 0.00–1.49)

## 2010-09-26 LAB — MRSA PCR SCREENING: MRSA by PCR: NEGATIVE

## 2010-10-09 ENCOUNTER — Telehealth: Payer: Self-pay | Admitting: Pulmonary Disease

## 2010-10-09 MED ORDER — ALBUTEROL SULFATE HFA 108 (90 BASE) MCG/ACT IN AERS: INHALATION_SPRAY | RESPIRATORY_TRACT | Status: DC

## 2010-10-09 NOTE — Telephone Encounter (Signed)
RX sent to the pharmacy  

## 2010-11-06 NOTE — Assessment & Plan Note (Signed)
[facility]                             PULMONARY OFFICE NOTE   Travis Ferguson, Travis Ferguson                       MRN:          [id number]  DATE:02/18/2007                            DOB:          Feb 22, 1941    I saw Travis Ferguson today in follow-up for his COPD, insomnia and  obstructive sleep apnea.   With regard to his COPD, his symptoms are reasonably stable on his  current inhaler regimen of Spiriva one puff daily as well as Symbicort  160/4.5 two puffs b.i.d.  He says that he is not having much with  regards to cough or sputum production and he feels like the inhalers are  helping.   With regards to his insomnia, he says that his sleep pattern is  essentially the same.  Again, the main concern is that he likely has  some degree of depression as well as possibly post traumatic stress.   With regards to his sleep apnea, he says that he knows that this may be  a problem but he would prefer to keep an eye on this for now.   His medication list was reviewed.   PHYSICAL EXAMINATION:  VITAL SIGNS:  Weight 250 pounds, temperature  98.2, blood pressure 160/98, heart rate 85, oxygen saturation 97% on  room air.  HEENT:  There is no sinus tenderness, no oral lesions, no  lymphadenopathy.  CHEST:  Prolonged expiratory phase but no wheezing or rales.  CARDIOVASCULAR:  S1 and S2.  ABDOMEN:  Obese, soft, nontender.  EXTREMITIES:  No edema.   IMPRESSION:  1. Chronic obstructive pulmonary disease.  I will continue him on his      inhaler regimen of Spiriva and Symbicort and I have given him      samples of both of these.  2. Sleep onset and sleep maintenance insomnia with likely underlying      post traumatic stress as well as possible depression.  I have again      re-emphasized to him the importance of maintaining his sleep      hygiene.  3. Mild obstructive sleep apnea.  I have advised him to monitor his      symptom status, but it may be best at some point  to reconsider      whether we should be more aggressive with his sleep apnea,      particularly in light of his other underlying health diseases.   I will follow up with him in approximately four to six months.    Travis Helling, MD  Electronically Signed   VS/MedQ  DD: 02/18/2007  DT: 02/19/2007  Job #: [id number]   cc:   Loraine Leriche A. Perini, M.D.

## 2010-11-06 NOTE — Procedures (Signed)
NAME:  Travis Ferguson, MOHL NO.:  [id number]   MEDICAL RECORD NO.:  [id number]          PATIENT TYPE:  OUT   LOCATION:  SLEEP CENTER                 FACILITY:  [facility]   PHYSICIAN:  Barbaraann Share, MD,FCCPDATE OF BIRTH:  04-30-41   DATE OF STUDY:  10/27/2006                            NOCTURNAL POLYSOMNOGRAM   REFERRING PHYSICIAN:   INDICATION FOR STUDY:  Other sleep disorder, 780.50.   EPWORTH SLEEPINESS SCORE:  Two.   SLEEP ARCHITECTURE:  The patient had a total sleep time of only 104  minutes and never achieved REM or slow wave sleep.  Sleep onset latency  was very prolonged at 78 minutes, and sleep efficiency was terrible at  29%.   RESPIRATORY DATA:  The patient was found to have 13 obstructive  hypopneas and 1 obstructive apnea for an apnea/hypopnea index over the  very short total sleep time of 8 events per hour.  The events were not  positional, and there was loud snoring noted during the study.   OXYGEN DATA:  There was O2 desaturation transiently to 88% during the  night.   CARDIAC DATA:  Rare PVC, but no clinically significant cardiac  arrhythmias.   MOVEMENT-PARASOMNIA:  The patient was found to 157 leg jerks with 5  resulting in arousal or awakening.  This gave him a periodic limb  movement arousal index of 3 per hour.   IMPRESSION/RECOMMENDATION:  1. Small numbers of obstructive events noted with an apnea/hypopnea      index of 8 per hour over a very short total sleep time of 104      minutes.  The patient did not reach rapid eye movement (REM) or      slow wave sleep, and therefore his degree of sleep apnea may be      underestimated.  Clinical correlation is suggested to see whether      the patient has clinically significant obstructive sleep apnea.  2. Rare premature ventricular contractions (PVC) noted without      significant cardiac arrhythmia.  3. Large numbers of leg jerks noted throughout the night with mild      sleep disruption.   However, the patient only      had 5 leg jerks during the entire night with arousal or awakening.      Clinical correlation is suggested to see if the patient may have a      primary movement disorder of sleep, which may be contributing to      his sleep disruption.      Barbaraann Share, MD,FCCP  Diplomate, American Board of Sleep  Medicine     KMC/MEDQ  D:  11/05/2006 18:17:31  T:  11/05/2006 65:78:46  Job:  [id number]
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
## 2010-11-09 NOTE — Op Note (Signed)
   NAME:  DEVAN, DANZER                          ACCOUNT NO.:  [id number]   MEDICAL RECORD NO.:  [id number]                   PATIENT TYPE:  AMB   LOCATION:  ENDO                                 FACILITY:  [facility]   PHYSICIAN:  Georgiana Spinner, M.D.                 DATE OF BIRTH:  1940/08/27   DATE OF PROCEDURE:  12/10/2002  DATE OF DISCHARGE:                                 OPERATIVE REPORT   PROCEDURE:  Colonoscopy.   INDICATIONS:  Colon polyps.   ANESTHESIA:  1. Demerol 20 mg.  2. Versed 2 mg.   DESCRIPTION OF PROCEDURE:  With the patient mildly sedated in the left  lateral decubitus position, the Olympus videoscopic colonoscope was inserted  in the rectum and passed under direct vision to the cecum, identified by the  ileocecal valve and appendiceal orifice, both of which were photographed.  From this point, the colonoscope was slowly withdrawn, taking  circumferential views of the entire colonic mucosa, stopping only to  photograph diverticula seen along the way in the sigmoid colon until we  reached the rectum which appeared normal on direct and retroflexed view.  The endoscope was straightened and withdrawn.  The patient's vital signs and  pulse oximeter remained stable.  The patient tolerated the procedure well  without apparent complications.   FINDINGS:  Diverticulosis of sigmoid colon.  Otherwise, an unremarkable  exam.   PLAN:  Possibly repeat examination in five years.                                               Georgiana Spinner, M.D.    GMO/MEDQ  D:  12/10/2002  T:  12/10/2002  Job:  [id number]   cc:   Loraine Leriche A. Waynard Edwards, M.D.  [address]  [place]  [place] [address]  Fax: [phone]

## 2010-11-09 NOTE — Op Note (Signed)
   NAME:  Travis Ferguson, Travis Ferguson                          ACCOUNT NO.:  [id number]   MEDICAL RECORD NO.:  [id number]                   PATIENT TYPE:  AMB   LOCATION:  ENDO                                 [facility]ACILITY:  [facility]   PHYSICIAN:  Georgiana Spinner, M.D.                 DATE OF BIRTH:  Apr 27, 1941   DATE OF PROCEDURE:  12/10/2002  DATE OF DISCHARGE:                                 OPERATIVE REPORT   PROCEDURE:  Upper endoscopy.   INDICATIONS:  GERD.   ANESTHESIA:  1. Demerol 60 mg.  2. Versed 6 mg.   DESCRIPTION OF PROCEDURE:  With patient mildly sedated in the left lateral  decubitus position, the Olympus videoscopic endoscope was inserted in the  mouth, passed under direct vision through the esophagus, which showed clear  changes of Barrett's that were photographed.  We entered into the stomach.  Fundus, body, antrum, duodenal bulb, second portion of duodenum all appeared  normal.  From this point, the endoscope was slowly withdrawn, taking  circumferential views of the duodenal mucosa until the endoscope then pulled  back into the stomach, placed in retroflexion to view the stomach from  below.  The endoscope was then straightened and withdrawn, taking  circumferential views of the remaining gastric and esophageal mucosa,  stopping in the distal esophagus to photograph and biopsy the areas of  Barrett's.  The patient's vital signs and pulse oximeter remained stable.  The patient tolerated the procedure well without apparent complications.   FINDINGS:  Clear endoscopic evidence of Barrett's esophagus, photographed  and biopsied.  Await biopsy report.  The patient will call me for results  and follow up with me as an outpatient.  Proceed to colonoscopy as planned.                                               Georgiana Spinner, M.D.    GMO/MEDQ  D:  12/10/2002  T:  12/10/2002  Job:  [id number]   cc:   Loraine Leriche A. Waynard Edwards, M.D.  [address]  [place]  [place] [address]  Fax: [phone]

## 2010-11-09 NOTE — Assessment & Plan Note (Signed)
[facility]                             PULMONARY OFFICE NOTE   Travis Ferguson, Travis Ferguson                       MRN:          [id number]  DATE:10/13/2006                            DOB:          March 19, 1941    A very pleasant 70 year old gentleman who follows here for chronic  obstructive pulmonary disease, mostly on the basis of chronic bronchitis  and chronic asthmatic bronchitis.  The patient is actually well  controlled on Symbicort and Spiriva.  During his past visit in December  he was seen by Dr. Marcelyn Bruins and at that time thought was given to  try getting him off the Spiriva.  The patient declined to do this mainly  because of his feeling that he is as well-compensated as he is to get  and due to prior issues without the Spiriva.  He now has clarified his  insurance coverage and his Symbicort previously tier 3 is now tier 2 and  he is able to afford it.  The patient has had, however, difficulties  which are new issues and is those of nocturnal awakenings and some  snoring.  He feels fatigued during the day.  He does not nap during the  day but does feel fatigued as noted above.  He denies any other  symptomatology.   CURRENT MEDICATIONS:  Are as noted on the intake sheet, these have been  reviewed and are accurate.   PHYSICAL EXAMINATION:  VITAL SIGNS:  Noted, oxygen saturation is 96% on  room air.  GENERAL:  This is a well-developed, well-nourished gentleman who is in  no acute distress.  He does have some upper airway pseudo-wheeze.  HEENT:  Unremarkable.  NECK:  Supple, no adenopathy noted, no JVD, pseudo-wheeze noted as  previous.  LUNGS:  Clear to auscultation bilaterally.  CARDIAC:  Regular rate and rhythm, no rubs, murmurs, or gallops heard.  EXTREMITIES:  The patient has no cyanosis, no clubbing, and no edema  noted.   IMPRESSION:  1. Chronic obstructive pulmonary disease on the basis of chronic      bronchitis and asthmatic  bronchitic component.  2. Issues with insomnia and frequent nocturnal awakenings, rule out      obstructive sleep apnea.   PLAN:  1. For the patient to undergo a sleep study, this will be a split      night study.  2. Continue Symbicort and Spiriva as they are.  3. I would like for him to see Dr. Coralyn Helling in 6-8 weeks time.  At      that time his sleep study      should be available as he is to have this performed on May 5.  4. Followup sooner than that on a p.r.n. basis.     Gailen Shelter, MD  Electronically Signed    CLG/MedQ  DD: 10/13/2006  DT: 10/13/2006  Job #: [id number]   cc:   Loraine Leriche A. Perini, M.D.  Coralyn Helling, MD  Sleep Lab

## 2010-11-09 NOTE — Assessment & Plan Note (Signed)
[facility]                               PULMONARY OFFICE NOTE   Travis Ferguson, Travis Ferguson                       MRN:          [id number]  DATE:03/17/2006                            DOB:          [birth year]    The patient is seen on March 17, 2006.   This is a very pleasant 70 year old gentleman who presents here for followup  on asthmatic bronchitis and chronic obstructive pulmonary disease.  The  patient, during his last visit in June, was switched over to [facility];  however, could not afford this because of his insurance plan.  He remains on  Foradil and Pulmicort.  He continues to have difficulties with paying for  his medications and has reached the donut hole.   The patient has no increased shortness of breath over his baseline.   CURRENT MEDICATIONS:  Are on the intake sheet.  These were reviewed and are  accurate.   PHYSICAL EXAMINATION:  VITAL SIGNS: As noted.  Oxygen saturation 96% on room  air.  GENERAL: This is a well-developed, somewhat obese gentleman who is in no  acute distress.  HEENT:  Unremarkable.  NECK:  Supple. No adenopathy noted.  No JVD.  LUNGS:  He does have mild pseudowheeze.  CARDIAC:  Regular rate and rhythm. No murmurs, rubs, or gallops heard.  EXTREMITIES:  No cyanosis, no clubbing, no edema noted.   IMPRESSION:  Chronic obstructive pulmonary disease with asthmatic bronchitis  component. The patient is still having difficulties with coverage for his  inhalers.  Otherwise well compensated.   PLAN:  1. Therefore, will be to give him a trial of Advair 230/21 HFA 2 puffs      twice a day.  The patient does not like use of spacers; therefore,      spacers were not taught.  Metered-dose inhaler instruction was given.  2. Continue Spiriva assist.  3. Followup will be in 2 months' time.  He is to contact us prior to that      time should any problems arise.                                   Gailen Shelter,  MD   CLG/MedQ  DD:  03/17/2006  DT:  03/18/2006  Job #:  [id number]   cc:   Loraine Leriche A. Perini, M.D.

## 2010-11-14 ENCOUNTER — Encounter: Payer: Self-pay | Admitting: Pulmonary Disease

## 2010-11-26 ENCOUNTER — Encounter: Payer: Self-pay | Admitting: Pulmonary Disease

## 2010-11-26 ENCOUNTER — Ambulatory Visit (INDEPENDENT_AMBULATORY_CARE_PROVIDER_SITE_OTHER): Payer: Medicare Other | Admitting: Pulmonary Disease

## 2010-11-26 DIAGNOSIS — J4489 Other specified chronic obstructive pulmonary disease: Secondary | ICD-10-CM

## 2010-11-26 DIAGNOSIS — J449 Chronic obstructive pulmonary disease, unspecified: Secondary | ICD-10-CM

## 2010-11-26 DIAGNOSIS — G4733 Obstructive sleep apnea (adult) (pediatric): Secondary | ICD-10-CM

## 2010-11-26 NOTE — Assessment & Plan Note (Signed)
He has a history of mild sleep apnea.  Explained that much of his current symptoms at night could be related to his sleep apnea.  He does not think he could tolerate CPAP.  Advised him to discuss with his dentist whether he could be a candidate for an oral appliance.

## 2010-11-26 NOTE — Patient Instructions (Signed)
Follow up in 6 months 

## 2010-11-26 NOTE — Progress Notes (Signed)
  Subjective:    Patient ID: Travis Ferguson, male    DOB: July 07, 1940, 70 y.o.   MRN: [id number]  HPI 70 yo male with COPD, insomnia, and mild OSA.  His breathing has been stable.  He is waking up frequently during the night.  He is using the bathroom lots at night.  She is snoring, and his wife sees him stop breathing while asleep.  He has been getting a dry mouth.  Past Medical History  Diagnosis Date  . COPD (chronic obstructive pulmonary disease)   . Depression   . PTSD (post-traumatic stress disorder)   . OSA (obstructive sleep apnea)   . Migraine   . GERD (gastroesophageal reflux disease)   . Dyslipidemia   . Rhinitis   . Cervical disc disease   . Subdural hematoma 11/10    Review of Systems     Objective:   Physical Exam  BP 104/60  Pulse 92  Temp(Src) 97.6 F (36.4 C) (Oral)  Ht 6' (1.829 m)  Wt 261 lb (118.389 kg)  BMI 35.40 kg/m2  SpO2 94%  General: normal appearance and obese.  Nose: clear drainage, no tenderness  Mouth: no oral lesion  Neck: no JVD.  Lungs: Decreased breath sounds, no wheezing, no dullness  Heart: regular rhythm, normal rate, and no murmurs.  Extremities: no edema  Neurologic: normal CN II-XII and strength normal.  Cervical Nodes: no significant adenopathy  Psych: alert and cooperative; normal mood and affect; normal attention span and concentration     Assessment & Plan:   COPD He is stable on his current regimen.  OBSTRUCTIVE SLEEP APNEA He has a history of mild sleep apnea.  Explained that much of his current symptoms at night could be related to his sleep apnea.  He does not think he could tolerate CPAP.  Advised him to discuss with his dentist whether he could be a candidate for an oral appliance.    Updated Medication List Outpatient Encounter Prescriptions as of 11/26/2010  Medication Sig Dispense Refill  . acetaminophen (TYLENOL) 500 MG tablet Take 500 mg by mouth every 6 (six) hours as needed.        [name] albuterol  (PROVENTIL HFA;VENTOLIN HFA) 108 (90 BASE) MCG/ACT inhaler 1-2 puffs every 4-6 hours as needed  1 Inhaler  3  . budesonide-formoterol (SYMBICORT) 160-4.5 MCG/ACT inhaler Inhale 2 puffs into the lungs 2 (two) times daily.        [name] ezetimibe-simvastatin (VYTORIN) 10-40 MG per tablet Take 1 tablet by mouth at bedtime.        [name] FLUoxetine (PROZAC) 40 MG capsule Take 40 mg by mouth daily.        [name] losartan (COZAAR) 25 MG tablet 1/2 tab by mouth once daily      . metoprolol succinate (TOPROL-XL) 25 MG 24 hr tablet Take 25 mg by mouth daily.        . Multiple Vitamin (MULTIVITAMIN) capsule Take 1 capsule by mouth daily.        . Omega-3 Fatty Acids (EQL OMEGA 3 FISH OIL) 1200 MG CPDR Take 3 capsules by mouth daily.        [name] tiotropium (SPIRIVA) 18 MCG inhalation capsule Place 18 mcg into inhaler and inhale daily.

## 2010-11-26 NOTE — Assessment & Plan Note (Signed)
He is stable on his current regimen.

## 2010-12-04 ENCOUNTER — Encounter: Payer: Self-pay | Admitting: Pulmonary Disease

## 2011-01-31 ENCOUNTER — Telehealth: Payer: Self-pay | Admitting: Pulmonary Disease

## 2011-01-31 MED ORDER — BUDESONIDE-FORMOTEROL FUMARATE 160-4.5 MCG/ACT IN AERO: 2.0000 | INHALATION_SPRAY | Freq: Two times a day (BID) | RESPIRATORY_TRACT | Status: DC

## 2011-01-31 NOTE — Telephone Encounter (Signed)
Pt requesting refills. Gave verbal to pharmacists. Carron Curie, CMA

## 2011-02-01 ENCOUNTER — Other Ambulatory Visit: Payer: Self-pay | Admitting: *Deleted

## 2011-02-01 MED ORDER — BUDESONIDE-FORMOTEROL FUMARATE 160-4.5 MCG/ACT IN AERO: 2.0000 | INHALATION_SPRAY | Freq: Two times a day (BID) | RESPIRATORY_TRACT | Status: DC

## 2011-04-17 ENCOUNTER — Other Ambulatory Visit: Payer: Self-pay | Admitting: *Deleted

## 2011-04-17 MED ORDER — BUDESONIDE-FORMOTEROL FUMARATE 160-4.5 MCG/ACT IN AERO: 2.0000 | INHALATION_SPRAY | Freq: Two times a day (BID) | RESPIRATORY_TRACT | Status: DC

## 2011-04-19 ENCOUNTER — Telehealth: Payer: Self-pay | Admitting: Pulmonary Disease

## 2011-04-19 NOTE — Telephone Encounter (Signed)
Pharmacist says they don't have the verbal okay to refill Symbicort from 04/17/2011. Verbal given via phone call today. Pt needs an OV for any further refills.

## 2011-04-19 NOTE — Telephone Encounter (Signed)
Pt calling again in reference to previous note can be reached at [phone].Travis Ferguson

## 2011-05-21 ENCOUNTER — Ambulatory Visit (INDEPENDENT_AMBULATORY_CARE_PROVIDER_SITE_OTHER): Payer: Medicare Other | Admitting: Pulmonary Disease

## 2011-05-21 ENCOUNTER — Encounter: Payer: Self-pay | Admitting: Pulmonary Disease

## 2011-05-21 DIAGNOSIS — J449 Chronic obstructive pulmonary disease, unspecified: Secondary | ICD-10-CM

## 2011-05-21 DIAGNOSIS — R0789 Other chest pain: Secondary | ICD-10-CM

## 2011-05-21 DIAGNOSIS — G4733 Obstructive sleep apnea (adult) (pediatric): Secondary | ICD-10-CM

## 2011-05-21 NOTE — Patient Instructions (Signed)
Follow up in 6 months 

## 2011-05-21 NOTE — Assessment & Plan Note (Signed)
Advised that he could try to decrease his inhalers as tolerated.  Advised that he should try changing symbicort to once per day first, and then monitor his symptoms for several weeks before making any additional changes.  His main concern is with the cost of his medicines.

## 2011-05-21 NOTE — Progress Notes (Signed)
Chief Complaint  Patient presents with  . Follow-up    Pt states he has no complaints with his breathing. Pt states he spoke with his dentists regarding the oral appliance and decided he does not want to do it  . Medication Refill    spiriva and symbicort    History of Present Illness: Travis Ferguson is a 70 y.o. male with COPD, insomnia, and mild OSA.  He continues to have trouble with his sleep.  He spoke to his dentist about oral appliance, but opted against this.  His breathing has been doing okay.  He does not have much cough, or wheeze.  His wife reports hearing him rattle in his chest at times.  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  He has noticed intermittent episodes of tingling/pin and needles feeling in his chest when he lays flat.  This last for a few seconds, and then resolves.  He has not noticed any other symptoms with this.  This does not occur all the time.    Past Medical History  Diagnosis Date  . COPD (chronic obstructive pulmonary disease)   . Depression   . PTSD (post-traumatic stress disorder)   . OSA (obstructive sleep apnea)   . Migraine   . GERD (gastroesophageal reflux disease)   . Dyslipidemia   . Rhinitis   . Cervical disc disease   . Subdural hematoma 11/10    Past Surgical History  Procedure Date  . Craniotomy 11/10    right frontal    Current Outpatient Prescriptions on File Prior to Visit  Medication Sig Dispense Refill  . acetaminophen (TYLENOL) 500 MG tablet Take 500 mg by mouth every 6 (six) hours as needed.        [name] albuterol (PROVENTIL HFA;VENTOLIN HFA) 108 (90 BASE) MCG/ACT inhaler 1-2 puffs every 4-6 hours as needed  1 Inhaler  3  . budesonide-formoterol (SYMBICORT) 160-4.5 MCG/ACT inhaler Inhale 2 puffs into the lungs 2 (two) times daily.  1 Inhaler  0  . ezetimibe-simvastatin (VYTORIN) 10-40 MG per tablet Take 1 tablet by mouth at bedtime.        [name] FLUoxetine (PROZAC) 40 MG capsule Take 40 mg by mouth daily.         [name] losartan (COZAAR) 25 MG tablet 1/2 tab by mouth once daily      . metoprolol succinate (TOPROL-XL) 25 MG 24 hr tablet Take 25 mg by mouth daily.        . Multiple Vitamin (MULTIVITAMIN) capsule Take 1 capsule by mouth daily.        . Omega-3 Fatty Acids (EQL OMEGA 3 FISH OIL) 1200 MG CPDR Take 3 capsules by mouth daily.        [name] tiotropium (SPIRIVA) 18 MCG inhalation capsule Place 18 mcg into inhaler and inhale daily.          No Known Allergies  Physical Exam:  Blood pressure 118/80, pulse 91, temperature 97.9 F (36.6 C), temperature source Oral, height 6' (1.829 m), weight 265 lb (120.203 kg), SpO2 97.00%.  General - Obese HEENT - no sinus tenderness, no oral exudate, no LAN Cardiac - s1s2 regular, no murmur Chest - decreased breath sounds, no wheeze or rales Abdomen - soft, nontender Extremities - no edema Skin - no rashes Neurologic - normal strength Psychiatric - normal mood, behavior   Assessment/Plan:  No problem-specific assessment & plan notes found for this encounter.   Outpatient Encounter Prescriptions as of 05/21/2011  Medication  Sig Dispense Refill  . acetaminophen (TYLENOL) 500 MG tablet Take 500 mg by mouth every 6 (six) hours as needed.        [name] albuterol (PROVENTIL HFA;VENTOLIN HFA) 108 (90 BASE) MCG/ACT inhaler 1-2 puffs every 4-6 hours as needed  1 Inhaler  3  . budesonide-formoterol (SYMBICORT) 160-4.5 MCG/ACT inhaler Inhale 2 puffs into the lungs 2 (two) times daily.  1 Inhaler  0  . ezetimibe-simvastatin (VYTORIN) 10-40 MG per tablet Take 1 tablet by mouth at bedtime.        [name] FLUoxetine (PROZAC) 40 MG capsule Take 40 mg by mouth daily.        [name] losartan (COZAAR) 25 MG tablet 1/2 tab by mouth once daily      . metoprolol succinate (TOPROL-XL) 25 MG 24 hr tablet Take 25 mg by mouth daily.        . Multiple Vitamin (MULTIVITAMIN) capsule Take 1 capsule by mouth daily.        . Omega-3 Fatty Acids (EQL OMEGA 3 FISH OIL) 1200 MG CPDR Take 3 capsules  by mouth daily.        [name] tiotropium (SPIRIVA) 18 MCG inhalation capsule Place 18 mcg into inhaler and inhale daily.          Lyn Joens Pager:  [phone] 05/21/2011, 11:17 AM

## 2011-05-21 NOTE — Assessment & Plan Note (Signed)
I do not think his symptoms are related to his COPD.  Advised him to discuss with his PCP for further assessment.

## 2011-05-21 NOTE — Assessment & Plan Note (Signed)
Will monitor clinically.  He is not able to tolerate any form of therapy for this.

## 2011-11-27 ENCOUNTER — Encounter: Payer: Self-pay | Admitting: Pulmonary Disease

## 2011-11-27 ENCOUNTER — Ambulatory Visit (INDEPENDENT_AMBULATORY_CARE_PROVIDER_SITE_OTHER): Payer: Medicare Other | Admitting: Pulmonary Disease

## 2011-11-27 VITALS — BP 118/62 | HR 85 | Temp 97.8°F | Ht 72.0 in | Wt 264.8 lb

## 2011-11-27 DIAGNOSIS — G4733 Obstructive sleep apnea (adult) (pediatric): Secondary | ICD-10-CM

## 2011-11-27 DIAGNOSIS — J449 Chronic obstructive pulmonary disease, unspecified: Secondary | ICD-10-CM

## 2011-11-27 NOTE — Assessment & Plan Note (Signed)
He could have sleep problems related to OSA.  He is unwilling to have therapy for this at present.    Discussed relaxation and stimulus control techniques to help with his insomnia.

## 2011-11-27 NOTE — Assessment & Plan Note (Signed)
Stable.  Advised he could try reducing frequency of symbicort.  He is to continue spiriva and prn albuterol.

## 2011-11-27 NOTE — Progress Notes (Signed)
Chief Complaint  Patient presents with  . Follow-up    Pt states his breathing has unchanged.Marland Kitchenoccasional cough, nasal congestion.    History of Present Illness: Travis Ferguson is a 71 y.o. male with COPD, insomnia, and mild OSA.  His breathing is doing okay.  He does not have much cough, wheeze, or chest congestion.  He is not using albuterol much.  He continues to have trouble with his sleep.  He has trouble falling asleep and staying asleep.  He has lots of things on his mind at night.  His wife reports that he snores, and makes funny breathing noises when asleep.   Past Medical History  Diagnosis Date  . COPD (chronic obstructive pulmonary disease)   . Depression   . PTSD (post-traumatic stress disorder)   . OSA (obstructive sleep apnea)   . Migraine   . GERD (gastroesophageal reflux disease)   . Dyslipidemia   . Rhinitis   . Cervical disc disease   . Subdural hematoma 11/10    Past Surgical History  Procedure Date  . Craniotomy 11/10    right frontal    No Known Allergies  Physical Exam:  Blood pressure 118/62, pulse 85, temperature 97.8 F (36.6 C), temperature source Oral, height 6' (1.829 m), weight 264 lb 12.8 oz (120.112 kg), SpO2 95.00%.  General - Obese HEENT - no sinus tenderness, no oral exudate, no LAN Cardiac - s1s2 regular, no murmur Chest - decreased breath sounds, no wheeze or rales Abdomen - soft, nontender Extremities - no edema Skin - no rashes Neurologic - normal strength Psychiatric - normal mood, behavior   Assessment/Plan:    Outpatient Encounter Prescriptions as of 11/27/2011  Medication Sig Dispense Refill  . albuterol (PROVENTIL HFA;VENTOLIN HFA) 108 (90 BASE) MCG/ACT inhaler 1-2 puffs every 4-6 hours as needed  1 Inhaler  3  . budesonide-formoterol (SYMBICORT) 160-4.5 MCG/ACT inhaler Inhale 2 puffs into the lungs 2 (two) times daily.  1 Inhaler  0  . ezetimibe-simvastatin (VYTORIN) 10-40 MG per tablet Take 1 tablet by mouth at  bedtime.        [name] FLUoxetine (PROZAC) 40 MG capsule Take 40 mg by mouth daily.        [name] losartan (COZAAR) 25 MG tablet 1/2 tab by mouth once daily      . metoprolol succinate (TOPROL-XL) 25 MG 24 hr tablet Take 25 mg by mouth daily.        . Multiple Vitamin (MULTIVITAMIN) capsule Take 1 capsule by mouth daily.        . Omega-3 Fatty Acids (EQL OMEGA 3 FISH OIL) 1200 MG CPDR Take 3 capsules by mouth daily.        [name] tiotropium (SPIRIVA) 18 MCG inhalation capsule Place 18 mcg into inhaler and inhale daily.        [name] acetaminophen (TYLENOL) 500 MG tablet Take 500 mg by mouth every 6 (six) hours as needed.          Meleni Delahunt Pager:  [phone] 11/27/2011, 10:55 AM

## 2011-11-27 NOTE — Patient Instructions (Signed)
Can try using symbicort two puffs at night Continue spiriva one puff daily Follow up in one year

## 2012-01-28 ENCOUNTER — Other Ambulatory Visit: Payer: Self-pay | Admitting: Pulmonary Disease

## 2012-01-28 MED ORDER — TIOTROPIUM BROMIDE MONOHYDRATE 18 MCG IN CAPS: 18.0000 ug | ORAL_CAPSULE | Freq: Every day | RESPIRATORY_TRACT | Status: DC

## 2012-01-28 NOTE — Telephone Encounter (Signed)
[facility]  SPIRIVA #90 X 3 RX SENT

## 2012-05-11 ENCOUNTER — Ambulatory Visit (INDEPENDENT_AMBULATORY_CARE_PROVIDER_SITE_OTHER)
Admission: RE | Admit: 2012-05-11 | Discharge: 2012-05-11 | Disposition: A | Discharge status: Home / Self Care | Payer: Medicare Other | Source: Ambulatory Visit | Attending: Adult Health | Admitting: Adult Health

## 2012-05-11 ENCOUNTER — Ambulatory Visit (INDEPENDENT_AMBULATORY_CARE_PROVIDER_SITE_OTHER): Payer: Medicare Other | Admitting: Adult Health

## 2012-05-11 ENCOUNTER — Encounter: Payer: Self-pay | Admitting: Adult Health

## 2012-05-11 VITALS — HR 97 | Temp 98.4°F | Ht 72.0 in | Wt 249.0 lb

## 2012-05-11 DIAGNOSIS — J449 Chronic obstructive pulmonary disease, unspecified: Secondary | ICD-10-CM

## 2012-05-11 MED ORDER — LEVALBUTEROL HCL 0.63 MG/3ML IN NEBU
0.6300 mg | INHALATION_SOLUTION | Freq: Once | RESPIRATORY_TRACT | Status: AC
  Administered 2012-05-11: 0.63 mg via RESPIRATORY_TRACT

## 2012-05-11 MED ORDER — PREDNISONE 10 MG PO TABS: ORAL_TABLET | ORAL | Status: DC

## 2012-05-11 MED ORDER — MOXIFLOXACIN HCL 400 MG PO TABS: 400.0000 mg | ORAL_TABLET | Freq: Every day | ORAL | Status: DC

## 2012-05-11 MED ORDER — TIOTROPIUM BROMIDE MONOHYDRATE 18 MCG IN CAPS: 18.0000 ug | ORAL_CAPSULE | Freq: Every day | RESPIRATORY_TRACT | Status: DC

## 2012-05-11 MED ORDER — BUDESONIDE-FORMOTEROL FUMARATE 160-4.5 MCG/ACT IN AERO: 2.0000 | INHALATION_SPRAY | Freq: Two times a day (BID) | RESPIRATORY_TRACT | Status: DC

## 2012-05-11 NOTE — Progress Notes (Signed)
Dg Chest 2 View  05/11/2012  *RADIOLOGY REPORT*  Clinical Data: Hemoptysis, wheezing, shortness of breath  CHEST - 2 VIEW  Comparison: 05/12/2009  Findings: Increased bibasilar streaky densities, suspect atelectasis.  Normal heart size and vascularity.  No significant effusion or pneumothorax.  Left midlung perihilar nodular focus noted measuring 12 x 21 mm suspicious for an underlying left lung nodule new since the prior study.  This warrants further evaluation with chest CT.  This could be performed non emergently.  Trachea is midline.  Degenerative changes of the spine and atherosclerosis of the aorta. Chronic scarring along the right minor fissure as before.  IMPRESSION: Streaky bibasilar densities, suspect atelectasis.  Right minor fissural scarring  Left midlung perihilar nodule warrants further evaluation with chest CT.  This has been made a call report.   Original Report Authenticated By: Judie Petit. Miles Costain, M.D.     Will need f/u CXR at next ROV and then decide about need for CT chest.

## 2012-05-11 NOTE — Patient Instructions (Addendum)
Avelox 400 mg daily for 7 days, take with food. Prednisone taper. Over the next week. Mucinex DM twice daily as needed. For cough and congestion. Fluids and rest. Followup in 4 days with Parrett or Sood  Please contact office for sooner follow up if symptoms do not improve or worsen or seek emergency care

## 2012-05-11 NOTE — Assessment & Plan Note (Signed)
Exacerbation complicated by Broncititis vs early CAP  CXR  With streaky bibasilar densities , left midlung nodule   Plan  Close follow up  Avelox 400 mg daily for 7 days, take with food. Prednisone taper. Over the next week. Mucinex DM twice daily as needed. For cough and congestion. Fluids and rest. Followup in 4 days with [facility]rett or Sood  Please contact office for sooner follow up if symptoms do not improve or worsen or seek emergency care   Will need to repeat xray in 2 weeks if nodule remains will need CT

## 2012-05-11 NOTE — Progress Notes (Signed)
  Subjective:    Patient ID: [name], male    DOB: 05/12/1941, 71 y.o.   MRN: [id number]  HPI 71 yo male with known hx of COPD, insomnia, and mild OSA.  05/11/2012 Acute OV  71 yo male with known hx of chills/sweats, prod cough with green/yellow/bloody mucus, wheezing, increased SOB, tightness in chest onset yesterday. Patient complains, that this morning, was coughing up some thick, green yellow mucus, and medicine specks of blood in it. Wheezing. Has worsened overnight. Patient denies any frank hemoptysis, chest pain, orthopnea, PND, or leg swelling. Appetite is normal, no nausea, vomiting, or diarrhea. Today CXR  shows streaky bibasilar densities  suspicious for atelectasis and a left perihilar  nodule 12 x 21 mm      Review of Systems Constitutional:   No  weight loss, night sweats,  Fevers, chills, fatigue, or  lassitude.  HEENT:   No headaches,  Difficulty swallowing,  Tooth/dental problems, or  Sore throat,                No sneezing, itching, ear ache, + nasal congestion, post nasal drip,   CV:  No chest pain,  Orthopnea, PND, swelling in lower extremities, anasarca, dizziness, palpitations, syncope.   GI  No heartburn, indigestion, abdominal pain, nausea, vomiting, diarrhea, change in bowel habits, loss of appetite, bloody stools.   Resp:    No chest wall deformity  Skin: no rash or lesions.  GU: no dysuria, change in color of urine, no urgency or frequency.  No flank pain, no hematuria   MS:  No joint pain or swelling.  No decreased range of motion.  No back pain.  Psych:  No change in mood or affect. No depression or anxiety.  No memory loss.         Objective:   Physical Exam  GEN: A/Ox3; pleasant , NAD, well obese   HEENT:  [place]/AT,  EACs-clear, TMs-wnl, NOSE-clear, THROAT-clear, no lesions, no postnasal drip or exudate noted.   NECK:  Supple w/ fair ROM; no JVD; normal carotid impulses w/o bruits; no thyromegaly or nodules palpated; no  lymphadenopathy.  RESP  Coarse Rhonchi w Burman Foster exp wheeze no accessory muscle use, no dullness to percussion  CARD:  RRR, no m/r/g  , no peripheral edema, pulses intact, no cyanosis or clubbing.  GI:   Soft & nt; nml bowel sounds; no organomegaly or masses detected.  Musco: Warm bil, no deformities or joint swelling noted.   Neuro: alert, no focal deficits noted.    Skin: Warm, no lesions or rashes        Assessment & Plan:

## 2012-05-11 NOTE — Addendum Note (Signed)
Addended by: Boone Master E on: 05/11/2012 05:19 PM   Modules accepted: Orders

## 2012-05-12 ENCOUNTER — Telehealth: Payer: Self-pay | Admitting: Adult Health

## 2012-05-12 NOTE — Telephone Encounter (Signed)
Pt saw TP 05/11/12: I spoke with pt and he stated so far this morning he has coughed up blood 3 times. It is quarter size amount, dark red and stringy. His congestion/SOB is better today. He is taking avelox 400 mg QD x 7 days and on pred taper. He is concerned bc usually it has just been dime size amounts. Please advise TP thanks  No Known Allergies

## 2012-05-12 NOTE — Telephone Encounter (Signed)
Spoke with patient in regards to recs per TP. Pt to keep f/u appt with TP on Thur. 05/14/12

## 2012-05-12 NOTE — Telephone Encounter (Signed)
LMTCB x 1 

## 2012-05-12 NOTE — Telephone Encounter (Signed)
Not on ASA products or blood thinners  If this gets worse or not resolving will need ov  Cont on Avelox  OV or ER if worse  Is suppose to be coming back this week for close follow up  Please contact office for sooner follow up if symptoms do not improve or worsen or seek emergency care

## 2012-05-14 ENCOUNTER — Ambulatory Visit (INDEPENDENT_AMBULATORY_CARE_PROVIDER_SITE_OTHER): Payer: Medicare Other | Admitting: Adult Health

## 2012-05-14 ENCOUNTER — Encounter: Payer: Self-pay | Admitting: Adult Health

## 2012-05-14 ENCOUNTER — Ambulatory Visit (INDEPENDENT_AMBULATORY_CARE_PROVIDER_SITE_OTHER)
Admission: RE | Admit: 2012-05-14 | Discharge: 2012-05-14 | Disposition: A | Discharge status: Home / Self Care | Payer: Medicare Other | Source: Ambulatory Visit | Attending: Adult Health | Admitting: Adult Health

## 2012-05-14 VITALS — BP 104/66 | HR 83 | Temp 96.9°F | Ht 70.0 in | Wt 262.8 lb

## 2012-05-14 DIAGNOSIS — J449 Chronic obstructive pulmonary disease, unspecified: Secondary | ICD-10-CM

## 2012-05-14 DIAGNOSIS — J4489 Other specified chronic obstructive pulmonary disease: Secondary | ICD-10-CM

## 2012-05-14 NOTE — Progress Notes (Signed)
  Subjective:    Patient ID: Travis Ferguson, male    DOB: [birth year], 71 y.o.   MRN: [id number]  HPI  71 yo male with known hx of COPD, insomnia, and mild OSA.  05/11/12  Acute OV  71 yo male with known hx of chills/sweats, prod cough with green/yellow/bloody mucus, wheezing, increased SOB, tightness in chest onset yesterday. Patient complains, that this morning, was coughing up some thick, green yellow mucus, and medicine specks of blood in it. Wheezing. Has worsened overnight. Patient denies any frank hemoptysis, chest pain, orthopnea, PND, or leg swelling. Appetite is normal, no nausea, vomiting, or diarrhea. Today CXR  shows streaky bibasilar densities  suspicious for atelectasis and a left perihilar  nodule 12 x 21 mm >>Rx Avelox x 7 days  05/14/2012 Follow up  3 day follow up COPD/hemoptysis - reports breathing/congestion are improved but still having hemoptysis tho the shade is darker. Feels much better. Decreased cough/congestion . Wheezing and Dyspnea are decreased  Still has mixed blood-dark streaked in mucus.  No calf pain, edema , chest pain. Chills and aches resolved.  Son dx with Sarcoid recently .  No fever . Good appetite.  Has 3 days left of Avelox and steroid taper.  Tolerating meds well with no n/v/d      Review of Systems  Constitutional:   No  weight loss, night sweats,  Fevers, chills, fatigue, or  lassitude.  HEENT:   No headaches,  Difficulty swallowing,  Tooth/dental problems, or  Sore throat,                No sneezing, itching, ear ache, + nasal congestion, post nasal drip,   CV:  No chest pain,  Orthopnea, PND, swelling in lower extremities, anasarca, dizziness, palpitations, syncope.   GI  No heartburn, indigestion, abdominal pain, nausea, vomiting, diarrhea, change in bowel habits, loss of appetite, bloody stools.   Resp:    No chest wall deformity  Skin: no rash or lesions.  GU: no dysuria, change in color of urine, no urgency or frequency.   No flank pain, no hematuria   MS:  No joint pain or swelling.  No decreased range of motion.  No back pain.  Psych:  No change in mood or affect. No depression or anxiety.  No memory loss.         Objective:   Physical Exam   GEN: A/Ox3; pleasant , NAD, well obese   HEENT:  [place]/AT,  EACs-clear, TMs-wnl, NOSE-clear, THROAT-clear, no lesions, no postnasal drip or exudate noted.   NECK:  Supple w/ fair ROM; no JVD; normal carotid impulses w/o bruits; no thyromegaly or nodules palpated; no lymphadenopathy.  RESP  CTA , resolved wheezing , no accessory muscle use, no dullness to percussion  CARD:  RRR, no m/r/g  , no peripheral edema, pulses intact, no cyanosis or clubbing.  GI:   Soft & nt; nml bowel sounds; no organomegaly or masses detected.  Musco: Warm bil, no deformities or joint swelling noted.   Neuro: alert, no focal deficits noted.    Skin: Warm, no lesions or rashes        Assessment & Plan:

## 2012-05-14 NOTE — Progress Notes (Signed)
Quick Note:  Pt aware of cxr results / recs per conversation with TP. Addendum to 11.21.13 ov note created for CT order w/ bmet. Spoke with Rose at [facility] CT Dept > CT chest w/ contrast scheduled for 11.22.13 @ 1pm; arrive at 12:45pm; NPO 2 hrs prior. STAT bmet scheduled; pt to come for labs at 10am/11am. Pt aware we will contact him with results once available. ______

## 2012-05-14 NOTE — Progress Notes (Signed)
Reviewed.  Has persistent Lt lung ASD.  Need CT chest with contrast to further assess.

## 2012-05-14 NOTE — Assessment & Plan Note (Addendum)
Resolving COPD  Exacerbation w/ suspected PNA -lingering hemoptysis  Chest xray today  Will need CT if not resolved on return and also to follow left perihilar nodule   Plan  Finish Avelox  Finish Prednisone taper. Mucinex DM twice daily as needed. For cough and congestion. Fluids and rest. Follow up with Dr. Craige Cotta  In 1-2 weeks with chest xray  Please contact office for sooner follow up if symptoms do not improve or worsen or seek emergency care

## 2012-05-14 NOTE — Patient Instructions (Addendum)
Finish Avelox  Finish Prednisone taper. Mucinex DM twice daily as needed. For cough and congestion. Fluids and rest. Follow up with Dr. Craige Cotta  In 1-2 weeks with chest xray  Please contact office for sooner follow up if symptoms do not improve or worsen or seek emergency care

## 2012-05-14 NOTE — Addendum Note (Signed)
Addended by: Boone Master E on: 05/14/2012 05:25 PM   Modules accepted: Orders

## 2012-05-15 ENCOUNTER — Telehealth: Payer: Self-pay | Admitting: Pulmonary Disease

## 2012-05-15 ENCOUNTER — Other Ambulatory Visit: Payer: Medicare Other

## 2012-05-15 ENCOUNTER — Ambulatory Visit (INDEPENDENT_AMBULATORY_CARE_PROVIDER_SITE_OTHER)
Admission: RE | Admit: 2012-05-15 | Discharge: 2012-05-15 | Disposition: A | Discharge status: Home / Self Care | Payer: Medicare Other | Source: Ambulatory Visit | Attending: Adult Health | Admitting: Adult Health

## 2012-05-15 DIAGNOSIS — J449 Chronic obstructive pulmonary disease, unspecified: Secondary | ICD-10-CM

## 2012-05-15 DIAGNOSIS — J4489 Other specified chronic obstructive pulmonary disease: Secondary | ICD-10-CM

## 2012-05-15 MED ORDER — IOHEXOL 300 MG/ML  SOLN: 80.0000 mL | Freq: Once | INTRAMUSCULAR | Status: AC | PRN

## 2012-05-15 NOTE — Telephone Encounter (Signed)
Results are still not in yet. Pt aware we are still looking out for this. Will hold in triage and if results are in will call TP before we leave

## 2012-05-15 NOTE — Telephone Encounter (Signed)
CT not yet read by radiologist.  Pt is aware of this and that we will call him when they are available.  Will continue to hold message in triage.

## 2012-05-15 NOTE — Telephone Encounter (Signed)
I spoke with TP and read report to her. Per TP advise pt looks like PNA continue ABX. She is out of the office and will give him a call on Monday when she reviews the report more in detail  i called to speak with pt and was advised by spouse she will have pt call us back.

## 2012-05-15 NOTE — Telephone Encounter (Signed)
Pt returned call. Travis Ferguson  

## 2012-05-15 NOTE — Telephone Encounter (Signed)
I spoke with pt and is aware. He voiced his understanding and He needed nothing further

## 2012-05-18 ENCOUNTER — Other Ambulatory Visit: Payer: Self-pay | Admitting: Pulmonary Disease

## 2012-05-18 MED ORDER — ALBUTEROL SULFATE HFA 108 (90 BASE) MCG/ACT IN AERS: INHALATION_SPRAY | RESPIRATORY_TRACT | Status: DC

## 2012-05-19 ENCOUNTER — Encounter: Payer: Self-pay | Admitting: Adult Health

## 2012-05-19 DIAGNOSIS — J189 Pneumonia, unspecified organism: Secondary | ICD-10-CM | POA: Insufficient documentation

## 2012-05-20 ENCOUNTER — Telehealth: Payer: Self-pay | Admitting: Pulmonary Disease

## 2012-05-20 NOTE — Telephone Encounter (Signed)
Notes Recorded by Julio Sicks, NP on 05/19/2012 at 12:09 PM Spoke w/ pt on 05/15/12 , advised of results.  Finishing abx, blood tinged mucus is resolving, and much improved  Will discuss results in more detail on return ov with Dr. Craige Cotta  Pt to follow up with sood as planned in 1 week and As needed  Please contact office for sooner follow up if symptoms do not improve or worsen or seek emergency care   ------------------  I spoke with pt and I advised him VS will go over his CT scan with him in detail with him at his next OV 12.4.13. He voiced his understanding and needed nothing further.

## 2012-05-27 ENCOUNTER — Encounter: Payer: Self-pay | Admitting: Pulmonary Disease

## 2012-05-27 ENCOUNTER — Ambulatory Visit (INDEPENDENT_AMBULATORY_CARE_PROVIDER_SITE_OTHER): Payer: Medicare Other | Admitting: Pulmonary Disease

## 2012-05-27 ENCOUNTER — Ambulatory Visit (INDEPENDENT_AMBULATORY_CARE_PROVIDER_SITE_OTHER)
Admission: RE | Admit: 2012-05-27 | Discharge: 2012-05-27 | Disposition: A | Discharge status: Home / Self Care | Payer: Medicare Other | Source: Ambulatory Visit | Attending: Pulmonary Disease | Admitting: Pulmonary Disease

## 2012-05-27 VITALS — BP 110/68 | HR 90 | Temp 97.5°F | Ht 72.0 in | Wt 258.2 lb

## 2012-05-27 DIAGNOSIS — J449 Chronic obstructive pulmonary disease, unspecified: Secondary | ICD-10-CM

## 2012-05-27 DIAGNOSIS — R9389 Abnormal findings on diagnostic imaging of other specified body structures: Secondary | ICD-10-CM

## 2012-05-27 DIAGNOSIS — R918 Other nonspecific abnormal finding of lung field: Secondary | ICD-10-CM

## 2012-05-27 DIAGNOSIS — G4733 Obstructive sleep apnea (adult) (pediatric): Secondary | ICD-10-CM

## 2012-05-27 NOTE — Progress Notes (Signed)
Chief Complaint  Patient presents with  . Follow-up    w/ cxr. breathing is okay. pt has question regarding CT scan.     History of Present Illness: Travis Ferguson is a 71 y.o. male former smoker with COPD, insomnia, and mild OSA.  He has been feeling better.  His cough has decreased, and he is no longer coughing up blood.  He denies wheeze, chest pain, fever, chills, or sweats.  Tests: PSG May 2008>> AHI 8 Spirometry 11/18/08>>FEV1 2.15 (61%), FEV1% 62 CT chest 05/19/12>>superior segment LLL infiltrate, b/l hilar and mediastinal LAN with calcification   Past Medical History  Diagnosis Date  . COPD (chronic obstructive pulmonary disease)   . Depression   . PTSD (post-traumatic stress disorder)   . OSA (obstructive sleep apnea)   . Migraine   . GERD (gastroesophageal reflux disease)   . Dyslipidemia   . Rhinitis   . Cervical disc disease   . Subdural hematoma 11/10    Past Surgical History  Procedure Date  . Craniotomy 11/10    right frontal    Current Outpatient Prescriptions on File Prior to Visit  Medication Sig Dispense Refill  . acetaminophen (TYLENOL) 500 MG tablet Take 1,000 mg by mouth 2 (two) times daily.       [name] albuterol (PROVENTIL HFA;VENTOLIN HFA) 108 (90 BASE) MCG/ACT inhaler 1-2 puffs every 4-6 hours as needed  1 Inhaler  5  . budesonide-formoterol (SYMBICORT) 160-4.5 MCG/ACT inhaler Inhale 2 puffs into the lungs 2 (two) times daily.  2 Inhaler  0  . ezetimibe-simvastatin (VYTORIN) 10-40 MG per tablet Take 1 tablet by mouth at bedtime.        [name] FLUoxetine (PROZAC) 40 MG capsule Take 40 mg by mouth daily.        [name] losartan-hydrochlorothiazide (HYZAAR) 50-12.5 MG per tablet 1/2 tab by mouth once daily      . metoprolol tartrate (LOPRESSOR) 25 MG tablet Take 1 tablet by mouth daily.      . Multiple Vitamin (MULTIVITAMIN) capsule Take 1 capsule by mouth daily.        . Omega-3 Fatty Acids (EQL OMEGA 3 FISH OIL) 1200 MG CPDR Take 1 capsule by mouth daily.        [name] tiotropium (SPIRIVA) 18 MCG inhalation capsule Place 1 capsule (18 mcg total) into inhaler and inhale daily.  90 capsule  3    No Known Allergies   Physical Exam: Filed Vitals:   05/27/12 1346 05/27/12 1349  BP:  110/68  Pulse:  90  Temp: 97.5 F (36.4 C)   TempSrc: Oral   Height: 6' (1.829 m)   Weight: 258 lb 3.2 oz (117.119 kg)   SpO2:  93%  ,  Current Encounter SPO2  05/27/12 1349 93%  05/14/12 1025 94%  05/11/12 0946 93%    Wt Readings from Last 3 Encounters:  05/27/12 258 lb 3.2 oz (117.119 kg)  05/14/12 262 lb 12.8 oz (119.205 kg)  05/11/12 249 lb (112.946 kg)    Body mass index is 35.02 kg/(m^2).   General - No distress ENT - No sinus tenderness, no oral exudate, no LAN Cardiac - s1s2 regular, no murmur Chest - Decreased breath sounds, no wheeze/rales/dullness Back - No focal tenderness Abd - Soft, non-tender Ext - No edema Neuro - Normal strength Skin - No rashes Psych - Normal mood, and behavior  Dg Chest 2 View  05/27/2012  *RADIOLOGY REPORT*  Clinical Data: Hemoptysis, COPD.  CHEST - 2 VIEW  Comparison: Chest x-ray 05/14/2012.  Chest CT 05/15/2012.  Findings: Nodularity within the left mid lung again noted, unchanged since prior chest x-ray.  Density within the lingula is stable, likely scarring.  Heart is upper limits normal in size.  No confluent opacity noted on the right.  No effusions.  No acute bony abnormality.  IMPRESSION: No change since recent chest x-ray.   Original Report Authenticated By: Charlett Nose, M.D.      Assessment/Plan:  Coralyn Helling, MD  [facility]/Sleep Pager:  [phone] 05/27/2012, 2:39 PM

## 2012-05-27 NOTE — Assessment & Plan Note (Signed)
Stable on inhaler regimen.   

## 2012-05-27 NOTE — Assessment & Plan Note (Signed)
Clinically improved after recent course of antibiotics.  He still has infiltrate on CXR, but explained this could take several more weeks to resolve.  Will f/u in 6 weeks with CXR and then determine if repeat CT chest is needed.

## 2012-05-27 NOTE — Patient Instructions (Signed)
Follow up in 6 weeks with chest xray

## 2012-07-09 ENCOUNTER — Other Ambulatory Visit (INDEPENDENT_AMBULATORY_CARE_PROVIDER_SITE_OTHER): Payer: Medicare Other

## 2012-07-09 ENCOUNTER — Ambulatory Visit (INDEPENDENT_AMBULATORY_CARE_PROVIDER_SITE_OTHER): Payer: Medicare Other | Admitting: Pulmonary Disease

## 2012-07-09 ENCOUNTER — Encounter: Payer: Self-pay | Admitting: Pulmonary Disease

## 2012-07-09 ENCOUNTER — Ambulatory Visit (INDEPENDENT_AMBULATORY_CARE_PROVIDER_SITE_OTHER)
Admission: RE | Admit: 2012-07-09 | Discharge: 2012-07-09 | Disposition: A | Discharge status: Home / Self Care | Payer: Medicare Other | Source: Ambulatory Visit | Attending: Pulmonary Disease | Admitting: Pulmonary Disease

## 2012-07-09 VITALS — BP 120/78 | HR 94 | Temp 97.1°F | Ht 72.0 in | Wt 257.8 lb

## 2012-07-09 DIAGNOSIS — J449 Chronic obstructive pulmonary disease, unspecified: Secondary | ICD-10-CM

## 2012-07-09 DIAGNOSIS — J189 Pneumonia, unspecified organism: Secondary | ICD-10-CM

## 2012-07-09 LAB — BASIC METABOLIC PANEL
Calcium: 9.6 mg/dL (ref 8.4–10.5)
Creatinine, Ser: 1 mg/dL (ref 0.4–1.5)
GFR: 77.2 mL/min (ref >60.00)
Glucose, Bld: 116 mg/dL — ABNORMAL HIGH (ref 70–99)
Sodium: 136 mEq/L (ref 135–145)

## 2012-07-09 NOTE — Assessment & Plan Note (Signed)
Stable on inhaler regimen.

## 2012-07-09 NOTE — Progress Notes (Signed)
Chief Complaint  Patient presents with  . Follow-up    w/ cxr. c/o nasal congestion, PND, blowing out yellow mixed mixed with blood, slight facial pressure. still wheeze some, coughs up yellow phlem. feels fatigue. no chest tx, no increase SOB no  fever.    History of Present Illness: Travis Ferguson is a 72 y.o. male former smoker with COPD, insomnia, and mild OSA.  He is here to follow up chest xray.  His lower respiratory symptoms have improved.  He continues to has sinus congestion and post-nasal drip.   TESTS: PSG May 2008>> AHI 8  Spirometry 11/18/08>>FEV1 2.15 (61%), FEV1% 62  CT chest 05/19/12>>superior segment LLL infiltrate, b/l hilar and mediastinal LAN with calcification   Past Medical History  Diagnosis Date  . COPD (chronic obstructive pulmonary disease)   . Depression   . PTSD (post-traumatic stress disorder)   . OSA (obstructive sleep apnea)   . Migraine   . GERD (gastroesophageal reflux disease)   . Dyslipidemia   . Rhinitis   . Cervical disc disease   . Subdural hematoma 11/10    Past Surgical History  Procedure Date  . Craniotomy 11/10    right frontal    Outpatient Encounter Prescriptions as of 07/09/2012  Medication Sig Dispense Refill  . acetaminophen (TYLENOL) 500 MG tablet Take 1,000 mg by mouth 2 (two) times daily.       [name] albuterol (PROVENTIL HFA;VENTOLIN HFA) 108 (90 BASE) MCG/ACT inhaler 1-2 puffs every 4-6 hours as needed  1 Inhaler  5  . budesonide-formoterol (SYMBICORT) 160-4.5 MCG/ACT inhaler Inhale 2 puffs into the lungs 2 (two) times daily.  2 Inhaler  0  . ezetimibe-simvastatin (VYTORIN) 10-40 MG per tablet Take 1 tablet by mouth at bedtime.        [name] FLUoxetine (PROZAC) 40 MG capsule Take 40 mg by mouth daily.        [name] losartan-hydrochlorothiazide (HYZAAR) 50-12.5 MG per tablet 1/2 tab by mouth once daily      . metoprolol tartrate (LOPRESSOR) 25 MG tablet Take 1 tablet by mouth daily.      . Multiple Vitamin (MULTIVITAMIN) capsule Take  1 capsule by mouth daily.        . Omega-3 Fatty Acids (EQL OMEGA 3 FISH OIL) 1200 MG CPDR Take 1 capsule by mouth daily.       [name] tiotropium (SPIRIVA) 18 MCG inhalation capsule Place 1 capsule (18 mcg total) into inhaler and inhale daily.  90 capsule  3    No Known Allergies  Physical Exam:  Filed Vitals:   07/09/12 1044  BP: 120/78  Pulse: 94  Temp: 97.1 F (36.2 C)  TempSrc: Oral  Height: 6' (1.829 m)  Weight: 257 lb 12.8 oz (116.937 kg)  SpO2: 92%     Current Encounter SPO2  07/09/12 1044 92%  05/27/12 1349 93%  05/14/12 1025 94%     Body mass index is 34.96 kg/(m^2).   Wt Readings from Last 2 Encounters:  07/09/12 257 lb 12.8 oz (116.937 kg)  05/27/12 258 lb 3.2 oz (117.119 kg)     General - No distress ENT - No sinus tenderness, no oral exudate, no LAN Cardiac - s1s2 regular, no murmur Chest - No wheeze/rales/dullness Back - No focal tenderness Abd - Soft, non-tender Ext - No edema Neuro - Normal strength Skin - No rashes Psych - normal mood, and behavior    07/09/2012  *RADIOLOGY REPORT*   Clinical Data: COPD, shortness of breath  CHEST - 2 VIEW   Comparison: 05/27/2012   Findings: Cardiomediastinal silhouette is unremarkable.  Stable nodule in the left upper lower lobe. Stable bilateral basilar streaky atelectasis or scarring.  No acute infiltrate or pulmonary edema.  Stable degenerative changes thoracic spine.   IMPRESSION: Again noted nodule in  left upper lobe stable in size and appearance.  Again noted bilateral basilar streaky atelectasis or scarring.  No acute infiltrate or pulmonary edema. Original Report Authenticated By: Natasha Mead, M.D.       Assessment/Plan:  Coralyn Helling, MD [facility]/Sleep Pager:  [phone] 07/09/2012, 10:57 AM

## 2012-07-09 NOTE — Patient Instructions (Signed)
Will schedule CT chest and call with results Follow up in 2 to 3 weeks

## 2012-07-09 NOTE — Assessment & Plan Note (Signed)
He has persistent radiographic infiltrates since November 2013.  I would expect that these infiltrates should have improved/resolved by now if related to pneumonia.  I am concerned these could be related to malignancy.  Will repeat CT chest with contrast to further evaluate, and call him with results.

## 2012-07-13 ENCOUNTER — Ambulatory Visit (INDEPENDENT_AMBULATORY_CARE_PROVIDER_SITE_OTHER)
Admission: RE | Admit: 2012-07-13 | Discharge: 2012-07-13 | Disposition: A | Discharge status: Home / Self Care | Payer: Medicare Other | Source: Ambulatory Visit | Attending: Pulmonary Disease | Admitting: Pulmonary Disease

## 2012-07-13 ENCOUNTER — Telehealth: Payer: Self-pay | Admitting: Pulmonary Disease

## 2012-07-13 DIAGNOSIS — J449 Chronic obstructive pulmonary disease, unspecified: Secondary | ICD-10-CM

## 2012-07-13 DIAGNOSIS — J4489 Other specified chronic obstructive pulmonary disease: Secondary | ICD-10-CM

## 2012-07-13 MED ORDER — IOHEXOL 300 MG/ML  SOLN
80.0000 mL | Freq: Once | INTRAMUSCULAR | Status: AC | PRN
  Administered 2012-07-13: 80 mL via INTRAVENOUS

## 2012-07-13 NOTE — Telephone Encounter (Signed)
Pt states he has not rec'd a call back today & would like to know when he will be able to get his results.  Antionette Fairy

## 2012-07-13 NOTE — Telephone Encounter (Signed)
Called, spoke with pt.  Apologized to the wait and advised we would call him back today regarding RB's thoughts if he is willing to look at these results and advise.  He verbalized understanding.  RB, pls advise.  Pt expecting a call back today.  Thank you.

## 2012-07-13 NOTE — Telephone Encounter (Signed)
Called, spoke with pt.  He had a CT Chest done today and states this he may possibly need to have a bx done.  He is requesting CT results.  Advised Dr. Craige Cotta is off until Thursday.  Pt states during his OV with VS on last Thursday, he specifically told Dr. Craige Cotta that he didn't want to have the test done then be advised by office that the "dr is on vacation" and have to wait days to get the results.  Pt states Dr. Craige Cotta advised, "We will take care of this."  Pt is anxious to get the results and requesting another provider look at results and advise.  Dr. Delton Coombes, will you pls look at results and advise until VS returns on Thursday?  Thank you.

## 2012-07-13 NOTE — Telephone Encounter (Signed)
Reviewed the notes and discussed th CT scan with the patient. There has been little interval change - the central LAD is present and there is disease extending along the LUL superior seg airway with some distal nodules and airspace disease. The nodular areas look about the same size, have not resolved. I tols the patient that he will need to discuss the next steps with Dr Craige Cotta when he returns, but that he will probably need further eval with FOB, possibly PET scan or other bx's depending on dr Evlyn Courier interpretation. Pt understands the scan's results and the plan. He has an OV with Dr Craige Cotta soon.

## 2012-07-14 ENCOUNTER — Telehealth: Payer: Self-pay | Admitting: Pulmonary Disease

## 2012-07-14 DIAGNOSIS — R918 Other nonspecific abnormal finding of lung field: Secondary | ICD-10-CM

## 2012-07-14 NOTE — Telephone Encounter (Signed)
07/13/2012  *RADIOLOGY REPORT*   Clinical Data: Hemoptysis.  Persistent pulmonary infiltrates. Evaluate for lung cancer.   CT CHEST WITH CONTRAST   Technique:  Multidetector CT imaging of the chest was performed following the standard protocol during bolus administration of intravenous contrast.   Contrast: 80mL OMNIPAQUE IOHEXOL 300 MG/ML  SOLN  Comparison: Chest CT 05/15/2012.    Findings:   Mediastinum: Heart size is normal. There is no significant pericardial fluid, thickening or pericardial calcification. There is atherosclerosis of the thoracic aorta, the great vessels of the mediastinum and the coronary arteries, including calcified atherosclerotic plaque in the left main, left anterior descending, left circumflex and right coronary arteries. Numerous borderline enlarged and enlarged mediastinal and bilateral hilar lymph nodes, many of which are calcified, overall appearing very similar to the prior examination.  Left hilar lymph nodes again encroach upon the left-sided bronchi, particularly bronchi extending to the left lower lobe.  Image 27 of series 3 demonstrates narrowing of the bronchus leading to the superior segment of the left lower lobe. The esophagus is unremarkable in appearance.   Lungs/Pleura: Again noted is a mass-like opacity in the superior segment of the left lower lobe which extends from the occluded superior segment left lower lobe bronchus posteriorly measuring approximately 5.4 x 1.9 cm (image 27 of series 2).  In the adjacent parenchyma of the superior segment of the left lower lobe there are extensive postobstructive changes, favored to predominately reflecting postobstructive pneumonitis/pneumonia.  However, there is increasing conspicuity of multiple nodular areas including 1.8 and 2.0 cm nodules with spiculated margins on image 22 of series 3 and 1.7 and 1.5 cm nodules on image 24 of series 3.  Diffuse ground glass attenuation and septal thickening is also noted throughout  the basal segments of the left lower lobe. Right-sided perihilar architectural distortion is similar to the prior examination.  Some nodularity along the inferior aspect of the right major fissure is slightly more conspicuous in the prior study, with the largest single nodule measuring 7 mm on image 28 of series 3.  No pleural effusions.    Upper Abdomen: High attenuation material filling the gallbladder is compatible with multiple small gallstones.  Gallbladder does not appear distended, nor is there pericholecystic fluid or stranding to suggest acute cholecystitis at this time.  Numerous colonic diverticula are noted in the region of the transverse colon and proximal descending colon, without surrounding inflammatory changes to suggest acute diverticulitis at this time.  Musculoskeletal: There are no aggressive appearing lytic or blastic lesions noted in the visualized portions of the skeleton.    IMPRESSION:  1.  The chest in large part appears similar to the prior study 05/15/2012, as detailed above.  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  The largest mass-like opacity in the superior segment of the left lower lobe is very similar in size and appearance measuring 5.4 x 1.9 cm.  However, there are multiple new nodular opacities throughout the periphery of the superior segment of the left lower lobe, and there is increasing ground-glass attenuation and septal thickening in the basal segments of the left lower lobe.  While these findings may simply reflect progressive postobstructive changes, the possibility of malignancy and lymphangitic spread of disease in this region is not entirely excluded.  As there does appear to be lymphadenopathy obstructing the bronchus to the superior segment of the left lower lobe, further evaluation with bronchoscopy may be warranted. Alternatively, PET CT may provide additional diagnostic information;  however, if these lesions are indeed  infectious or inflammatory rather than neoplastic, these nodules may also appear hypermetabolic at time of PET CT ( however, lack of hypermetabolic activity in these lesions at time of PET CT would be reassuring).  2.  Cholelithiasis.  3.  Colonic diverticulosis.    Original Report Authenticated By: Trudie Reed, M.D.     Results d/w pt over the phone.  Will arrange for PET scan to further assess, and better determine optimal location for biopsy attempt.  Based on results will likely then need to schedule bronchoscopy.

## 2012-07-22 ENCOUNTER — Ambulatory Visit (HOSPITAL_COMMUNITY)
Admission: RE | Admit: 2012-07-22 | Discharge: 2012-07-22 | Disposition: A | Discharge status: Home / Self Care | Payer: Medicare Other | Source: Ambulatory Visit | Attending: Pulmonary Disease | Admitting: Pulmonary Disease

## 2012-07-22 ENCOUNTER — Telehealth: Payer: Self-pay | Admitting: Pulmonary Disease

## 2012-07-22 DIAGNOSIS — K8020 Calculus of gallbladder without cholecystitis without obstruction: Secondary | ICD-10-CM | POA: Insufficient documentation

## 2012-07-22 DIAGNOSIS — K5730 Diverticulosis of large intestine without perforation or abscess without bleeding: Secondary | ICD-10-CM | POA: Insufficient documentation

## 2012-07-22 DIAGNOSIS — R918 Other nonspecific abnormal finding of lung field: Secondary | ICD-10-CM

## 2012-07-22 DIAGNOSIS — R222 Localized swelling, mass and lump, trunk: Secondary | ICD-10-CM | POA: Insufficient documentation

## 2012-07-22 DIAGNOSIS — I700 Atherosclerosis of aorta: Secondary | ICD-10-CM | POA: Insufficient documentation

## 2012-07-22 DIAGNOSIS — R911 Solitary pulmonary nodule: Secondary | ICD-10-CM | POA: Insufficient documentation

## 2012-07-22 MED ORDER — FLUDEOXYGLUCOSE F - 18 (FDG) INJECTION
14.4000 | Freq: Once | INTRAVENOUS | Status: AC | PRN
  Administered 2012-07-22: 14.4 via INTRAVENOUS

## 2012-07-22 NOTE — Telephone Encounter (Signed)
07/22/2012  *RADIOLOGY REPORT*   Clinical Data: Initial treatment strategy for lung mass   NUCLEAR MEDICINE PET SKULL BASE TO THIGH  Fasting Blood Glucose:  122  Technique:  14.4 mCi F-18 FDG was injected intravenously. CT data was obtained and used for attenuation correction and anatomic localization only.  (This was not acquired as a diagnostic CT examination.) Additional exam technical data entered on technologist worksheet.   Comparison:  07/13/2012; report from 10/28/2001    Findings:   Neck: No hypermetabolic lymph nodes in the neck. Physiologic glottic activity noted.    Chest:  Scattered calcified mediastinal lymph nodes are present and some appear mildly hypermetabolic.  For example, the 2.5 cm in short axis diameter subcarinal node is slightly above background mediastinal activity with maximum standard uptake value 5.8.  Clustered nodules in the superior segment left lower lobe are hypermetabolic, with maximum standard uptake value of the clustered nodules at 12.1.  Abnormal interstitial thickening noted at the right lung base, with maximum standard uptake value in the lung parenchyma at 4.5 at the left lung base and 3.4 at the comparison the right lung base. Borderline hypermetabolic appearance of the hilar lymph nodes noted.  Borderline hypermetabolic appearance of prevascular lymph nodes and right paratracheal lymph nodes.    Abdomen/Pelvis:  Innumerable tiny gallstones noted in the gallbladder.  Adrenal glands unremarkable.  Proximal sigmoid diverticulosis.  No significant abnormal hypermetabolic activity in the abdomen or pelvis.  Aortoiliac atherosclerotic calcification noted.    Skeleton:  No focal hypermetabolic activity to suggest skeletal metastasis.    IMPRESSION:   1.  The superior segment left lower lobe nodules are hypermetabolic, as could be seen in malignancy or active granulomatous process.  The calcified adenopathy in the chest is very faintly hypermetabolic, and likewise  could be from reactivated active granulomatous process or malignancy.  Similar sized lymph nodes were described on the report from prior 2003 CT chest.  There is mild asymmetric activity in the lung parenchyma at the bases where there is interstitial accentuation at the left lung base which morphologically raises concern for the possibility of lymphangitic carcinomatosis.  The left upper lobe appears slightly more lucent than the right, possibly from air trapping related to the adenopathy compressing the left-sided airways or slight reticulonodular interstitial infiltration of the right lung. Differential diagnostic considerations include sarcoidosis, left lower lobe lung cancer superimposed on old granulomatous disease, or a reactivated granulomatous process such as tuberculosis. Tissue diagnosis from left lower lobe nodules recommended.  2.  Cholelithiasis.  3.  Proximal sigmoid diverticulosis.  4.  Atherosclerosis.    Original Report Authenticated By: Gaylyn Rong, M.D.     Results d/w pt.  Will schedule bronchoscopy to further assess.  Will call him when bronchoscopy timing is scheduled.

## 2012-07-23 NOTE — Telephone Encounter (Signed)
Informed patient that Bronchoscopy has been scheduled for Tuesday, February 4 at 1:30 PM.  Advised him to arrange for transportation.  He can have light breakfast at 6 am on day of test, then NPO after.  Advised he should arrive to [facility] 1 hour prior to scheduled procedure time.  Bronchoscopy procedure explained.  Risks were detailed as bleeding, infection, pneumothorax, and non-diagnosis.

## 2012-07-27 ENCOUNTER — Telehealth: Payer: Self-pay | Admitting: Pulmonary Disease

## 2012-07-27 NOTE — Telephone Encounter (Signed)
Looks like the bronch wasn't/needs to be pre-certed ??? ATC Rhonda/Libby, NA/both w/ patients Will forward to [place] Day inbox Ladies please advise, thanks!

## 2012-07-27 NOTE — Telephone Encounter (Signed)
Spoke to Travis Ferguson she was given cpt code 96045 and dx 793.11 Tobe Sos

## 2012-07-28 ENCOUNTER — Ambulatory Visit (HOSPITAL_COMMUNITY)
Admission: RE | Admit: 2012-07-28 | Discharge: 2012-07-28 | Disposition: A | Discharge status: Home / Self Care | Payer: Medicare Other | Source: Ambulatory Visit | Attending: Pulmonary Disease | Admitting: Pulmonary Disease

## 2012-07-28 ENCOUNTER — Encounter (HOSPITAL_COMMUNITY): Payer: Self-pay | Admitting: Radiology

## 2012-07-28 ENCOUNTER — Encounter (HOSPITAL_COMMUNITY)
Admission: RE | Disposition: A | Discharge status: Home / Self Care | Payer: Self-pay | Source: Ambulatory Visit | Attending: Pulmonary Disease

## 2012-07-28 ENCOUNTER — Ambulatory Visit (HOSPITAL_COMMUNITY): Payer: Medicare Other

## 2012-07-28 DIAGNOSIS — J4489 Other specified chronic obstructive pulmonary disease: Secondary | ICD-10-CM | POA: Insufficient documentation

## 2012-07-28 DIAGNOSIS — J8410 Pulmonary fibrosis, unspecified: Secondary | ICD-10-CM | POA: Insufficient documentation

## 2012-07-28 DIAGNOSIS — G4733 Obstructive sleep apnea (adult) (pediatric): Secondary | ICD-10-CM | POA: Insufficient documentation

## 2012-07-28 DIAGNOSIS — E785 Hyperlipidemia, unspecified: Secondary | ICD-10-CM | POA: Insufficient documentation

## 2012-07-28 DIAGNOSIS — J449 Chronic obstructive pulmonary disease, unspecified: Secondary | ICD-10-CM | POA: Insufficient documentation

## 2012-07-28 DIAGNOSIS — R918 Other nonspecific abnormal finding of lung field: Secondary | ICD-10-CM

## 2012-07-28 DIAGNOSIS — G43909 Migraine, unspecified, not intractable, without status migrainosus: Secondary | ICD-10-CM | POA: Insufficient documentation

## 2012-07-28 DIAGNOSIS — Z79899 Other long term (current) drug therapy: Secondary | ICD-10-CM | POA: Insufficient documentation

## 2012-07-28 DIAGNOSIS — D860 Sarcoidosis of lung: Secondary | ICD-10-CM | POA: Diagnosis present

## 2012-07-28 DIAGNOSIS — J984 Other disorders of lung: Secondary | ICD-10-CM | POA: Insufficient documentation

## 2012-07-28 DIAGNOSIS — R222 Localized swelling, mass and lump, trunk: Secondary | ICD-10-CM | POA: Insufficient documentation

## 2012-07-28 HISTORY — PX: VIDEO BRONCHOSCOPY: SHX5072

## 2012-07-28 SURGERY — BRONCHOSCOPY, WITH FLUOROSCOPY
Anesthesia: Moderate Sedation | Laterality: Bilateral

## 2012-07-28 MED ORDER — FENTANYL CITRATE 0.05 MG/ML IJ SOLN
INTRAMUSCULAR | Status: DC | PRN
  Administered 2012-07-28 (×2): 50 ug via INTRAVENOUS

## 2012-07-28 MED ORDER — FENTANYL CITRATE 0.05 MG/ML IJ SOLN
INTRAMUSCULAR | Status: AC
  Filled 2012-07-28: qty 4

## 2012-07-28 MED ORDER — MIDAZOLAM HCL 10 MG/2ML IJ SOLN
INTRAMUSCULAR | Status: DC | PRN
  Administered 2012-07-28 (×4): 2 mg via INTRAVENOUS

## 2012-07-28 MED ORDER — MIDAZOLAM HCL 10 MG/2ML IJ SOLN
INTRAMUSCULAR | Status: AC
  Filled 2012-07-28: qty 4

## 2012-07-28 MED ORDER — LIDOCAINE HCL (PF) 1 % IJ SOLN
INTRAMUSCULAR | Status: DC | PRN
  Administered 2012-07-28: 5 mL

## 2012-07-28 NOTE — Procedures (Signed)
Bronchoscopy Procedure Note LAVAUGHN HABERLE [id number] Apr 20, 1941  Procedure: Bronchoscopy Indications: 72 yo male former smoker with left lower lung mass.  Procedure Details Consent: Risks of procedure as well as the alternatives and risks of each were explained to the patient.  Risks detailed as bleeding, infection, pneumothorax, and non-diagnosis.  Consent for procedure obtained. Time Out: Verified patient identification, verified procedure, site/side was marked, verified correct patient position, special equipment/implants available, medications/allergies/relevent history reviewed, required imaging and test results available.  Performed  Lidocaine nebulized for anesthesia of posterior pharynx.  He was given 8 mg versed, and 100 mcg fentanyl IV for sedation and analgesia.  12 ml of lidocaine instilled for topical anesthesia of airways.    Bronchoscope entered orally.  Vocal cords visualized with normal motion.  Bronchoscope entered into trachea.  Thick white to yellow secretions from airways.  Bronchoscope entered into right main bronchus.  Thick secretions mostly from Rt middle and Rt lower lobes.  No endobronchial lesions, and mucosa appeared normal.  Bronchoscope then entered to left main bronchus.  Thick secretions mostly from Lt upper and Lt lower lobes.  No endobronchial lesions visualized, and mucosa appeared normal.  BAL done from Lt lower lobe with 100 ml saline in and 15 ml of cloudy, white fluid returned.  Brush cytology then done from Lt lower lower.  Then using fluoroscopic guidance biopsy from Lt lower lower in multiple segments.  Difficult to pass biopsy forceps into superior segment Lt lower lobe.  No obvious complications, and no evidence for bleeding.  Bronchoscope withdrawn.  Evaluation Hemodynamic Status: BP stable throughout; O2 sats: stable throughout Patient's Current Condition: stable Specimens:  BAL left lower lobe for microbiology and cytology, brushing left lower  lobe for cytology, and surgical pathology from left lower lobe. Complications: No apparent complications Patient did tolerate procedure well. Will call patient with results and schedule follow up appointment at that time.  Coralyn Helling, MD [facility] Pulmonary/Critical Care 07/28/2012, 2:14 PM Pager:  [phone] After 3pm call: [phone]

## 2012-07-28 NOTE — Progress Notes (Signed)
Bronchoscopy performed with intervention BAL, intervention Brushing, intervention Biopsy

## 2012-07-28 NOTE — H&P (Signed)
CC: Lung mass.  History of Present Illness: Travis Ferguson is a 72 y.o. male former smoker who was treated for pneumonia in November 2013.  He had follow up chest xray which showed persistence of Lt lower lung infiltrate.  He then had CT chest and PET scan to further assess these findings.  Results are detailed below.  He is scheduled for bronchoscopy to further assess.  He has occasional cough.  He denies sputum, fever, chest pain or hemoptysis.  He has occasional sinus congestion.  His breathing is okay at steady activity level, but he will get winded with strenuous exertion.  He uses inhaler therapy, and this helps.  Tests: PSG May 2008>> AHI 8  Spirometry 11/18/08>>FEV1 2.15 (61%), FEV1% 62  CT chest 05/19/12>>superior segment LLL infiltrate, b/l hilar and mediastinal LAN with calcification  Past Medical History  Diagnosis Date  . COPD (chronic obstructive pulmonary disease)   . Depression   . PTSD (post-traumatic stress disorder)   . OSA (obstructive sleep apnea)   . Migraine   . GERD (gastroesophageal reflux disease)   . Dyslipidemia   . Rhinitis   . Cervical disc disease   . Subdural hematoma 11/10    Past Surgical History  Procedure Date  . Craniotomy 11/10    right frontal    No current facility-administered medications on file prior to encounter.   Current Outpatient Prescriptions on File Prior to Encounter  Medication Sig Dispense Refill  . acetaminophen (TYLENOL) 500 MG tablet Take 1,000 mg by mouth 2 (two) times daily.       [name] albuterol (PROVENTIL HFA;VENTOLIN HFA) 108 (90 BASE) MCG/ACT inhaler 1-2 puffs every 4-6 hours as needed  1 Inhaler  5  . budesonide-formoterol (SYMBICORT) 160-4.5 MCG/ACT inhaler Inhale 2 puffs into the lungs 2 (two) times daily.  2 Inhaler  0  . ezetimibe-simvastatin (VYTORIN) 10-40 MG per tablet Take 1 tablet by mouth at bedtime.        [name] FLUoxetine (PROZAC) 40 MG capsule Take 40 mg by mouth daily.        [name] losartan-hydrochlorothiazide  (HYZAAR) 50-12.5 MG per tablet 1/2 tab by mouth once daily      . metoprolol tartrate (LOPRESSOR) 25 MG tablet Take 1 tablet by mouth daily.      . Multiple Vitamin (MULTIVITAMIN) capsule Take 1 capsule by mouth daily.        . Omega-3 Fatty Acids (EQL OMEGA 3 FISH OIL) 1200 MG CPDR Take 1 capsule by mouth daily.       [name] tiotropium (SPIRIVA) 18 MCG inhalation capsule Place 1 capsule (18 mcg total) into inhaler and inhale daily.  90 capsule  3    No Known Allergies  Family History  Problem Relation Age of Onset  . Coronary artery disease      History  Substance Use Topics  . Smoking status: Former Smoker -- 25 years    Types: Pipe    Quit date: 06/24/2008  . Smokeless tobacco: Not on file  . Alcohol Use: Not on file     Physical Exam: BP:  120/78   Pulse:  94   Temp:  97.1 F (36.2 C)   TempSrc:  Oral   Height:  6' (1.829 m)   Weight:  257 lb 12.8 oz (116.937 kg)   SpO2:  92%    Current Encounter SPO2   07/09/12  1044  92%   05/27/12  1349  93%   05/14/12  1025  94%  Body mass index is 34.96 kg/(m^2).  Wt Readings from Last 2 Encounters:   07/09/12  257 lb 12.8 oz (116.937 kg)   05/27/12  258 lb 3.2 oz (117.119 kg)      General - No distress ENT - No sinus tenderness, no oral exudate, no LAN, no thyromegaly, TM clear, pupils equal/reactive Cardiac - s1s2 regular, no murmur, pulses symmetric Chest - No wheeze/rales/dullness, good air entry, normal respiratory excursion Back - No focal tenderness Abd - Soft, non-tender, no organomegaly, + bowel sounds Ext - No edema Neuro - Normal strength, cranial nerves intact Skin - No rashes Psych - Normal mood, and behavior.     07/13/2012  *RADIOLOGY REPORT*   Clinical Data: Hemoptysis.  Persistent pulmonary infiltrates. Evaluate for lung cancer. CT CHEST WITH CONTRAST   Technique:  Multidetector CT imaging of the chest was performed following the standard protocol during bolus administration of intravenous contrast.    Contrast: 80mL OMNIPAQUE IOHEXOL 300 MG/ML  SOLN   Comparison: Chest CT 05/15/2012.   Findings:   Mediastinum: Heart size is normal. There is no significant pericardial fluid, thickening or pericardial calcification. There is atherosclerosis of the thoracic aorta, the great vessels of the mediastinum and the coronary arteries, including calcified atherosclerotic plaque in the left main, left anterior descending, left circumflex and right coronary arteries. Numerous borderline enlarged and enlarged mediastinal and bilateral hilar lymph nodes, many of which are calcified, overall appearing very similar to the prior examination.  Left hilar lymph nodes again encroach upon the left-sided bronchi, particularly bronchi extending to the left lower lobe.  Image 27 of series 3 demonstrates narrowing of the bronchus leading to the superior segment of the left lower lobe. The esophagus is unremarkable in appearance.   Lungs/Pleura: Again noted is a mass-like opacity in the superior segment of the left lower lobe which extends from the occluded superior segment left lower lobe bronchus posteriorly measuring approximately 5.4 x 1.9 cm (image 27 of series 2).  In the adjacent parenchyma of the superior segment of the left lower lobe there are extensive postobstructive changes, favored to predominately reflecting postobstructive pneumonitis/pneumonia.  However, there is increasing conspicuity of multiple nodular areas including 1.8 and 2.0 cm nodules with spiculated margins on image 22 of series 3 and 1.7 and 1.5 cm nodules on image 24 of series 3.  Diffuse ground glass attenuation and septal thickening is also noted throughout the basal segments of the left lower lobe. Right-sided perihilar architectural distortion is similar to the prior examination.  Some nodularity along the inferior aspect of the right major fissure is slightly more conspicuous in the prior study, with the largest single nodule measuring 7 mm on image  28 of series 3.  No pleural effusions.   Upper Abdomen: High attenuation material filling the gallbladder is compatible with multiple small gallstones.  Gallbladder does not appear distended, nor is there pericholecystic fluid or stranding to suggest acute cholecystitis at this time.  Numerous colonic diverticula are noted in the region of the transverse colon and proximal descending colon, without surrounding inflammatory changes to suggest acute diverticulitis at this time.   Musculoskeletal: There are no aggressive appearing lytic or blastic lesions noted in the visualized portions of the skeleton.   IMPRESSION:  1.  The chest in large part appears similar to the prior study 05/15/2012, as detailed above.  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  The largest mass-like opacity in the superior segment of the left  lower lobe is very similar in size and appearance measuring 5.4 x 1.9 cm.  However, there are multiple new nodular opacities throughout the periphery of the superior segment of the left lower lobe, and there is increasing ground-glass attenuation and septal thickening in the basal segments of the left lower lobe.  While these findings may simply reflect progressive postobstructive changes, the possibility of malignancy and lymphangitic spread of disease in this region is not entirely excluded.  As there does appear to be lymphadenopathy obstructing the bronchus to the superior segment of the left lower lobe, further evaluation with bronchoscopy may be warranted. Alternatively, PET CT may provide additional diagnostic information; however, if these lesions are indeed infectious or inflammatory rather than neoplastic, these nodules may also appear hypermetabolic at time of PET CT ( however, lack of hypermetabolic activity in these lesions at time of PET CT would be reassuring).  2.  Cholelithiasis.  3.  Colonic diverticulosis.    Original Report Authenticated By:  Trudie Reed, M.D.      07/22/2012  *RADIOLOGY REPORT*   Clinical Data: Initial treatment strategy for lung mass   NUCLEAR MEDICINE PET SKULL BASE TO THIGH   Fasting Blood Glucose:  122   Technique:  14.4 mCi F-18 FDG was injected intravenously. CT data was obtained and used for attenuation correction and anatomic localization only.  (This was not acquired as a diagnostic CT examination.) Additional exam technical data entered on technologist worksheet.  Comparison:  07/13/2012; report from 10/28/2001   Findings:   Neck: No hypermetabolic lymph nodes in the neck. Physiologic glottic activity noted. Chest:  Scattered calcified mediastinal lymph nodes are present and some appear mildly hypermetabolic.  For example, the 2.5 cm in short axis diameter subcarinal node is slightly above background mediastinal activity with maximum standard uptake value 5.8.  Clustered nodules in the superior segment left lower lobe are hypermetabolic, with maximum standard uptake value of the clustered nodules at 12.1.  Abnormal interstitial thickening noted at the right lung base, with maximum standard uptake value in the lung parenchyma at 4.5 at the left lung base and 3.4 at the comparison the right lung base. Borderline hypermetabolic appearance of the hilar lymph nodes noted.  Borderline hypermetabolic appearance of prevascular lymph nodes and right paratracheal lymph nodes.   Abdomen/Pelvis:  Innumerable tiny gallstones noted in the gallbladder.  Adrenal glands unremarkable.  Proximal sigmoid diverticulosis.  No significant abnormal hypermetabolic activity in the abdomen or pelvis.  Aortoiliac atherosclerotic calcification noted. Skeleton:  No focal hypermetabolic activity to suggest skeletal metastasis. IMPRESSION:   1.  The superior segment left lower lobe nodules are hypermetabolic, as could be seen in malignancy or active granulomatous process.  The calcified adenopathy in the chest is very faintly  hypermetabolic, and likewise could be from reactivated active granulomatous process or malignancy.  Similar sized lymph nodes were described on the report from prior 2003 CT chest.  There is mild asymmetric activity in the lung parenchyma at the bases where there is interstitial accentuation at the left lung base which morphologically raises concern for the possibility of lymphangitic carcinomatosis.  The left upper lobe appears slightly more lucent than the right, possibly from air trapping related to the adenopathy compressing the left-sided airways or slight reticulonodular interstitial infiltration of the right lung. Differential diagnostic considerations include sarcoidosis, left lower lobe lung cancer superimposed on old granulomatous disease, or a reactivated granulomatous process such as tuberculosis. Tissue diagnosis from left lower lobe nodules recommended.  2.  Cholelithiasis.  3.  Proximal sigmoid diverticulosis.  4.  Atherosclerosis.    Original Report Authenticated By: Gaylyn Rong, M.D.     Assessment/Plan:  72 yo male with hx of smoking and persistent left lower lung nodules/masses associated with calcified mediastinal/hilar adenopathy.  Concern is for malignancy versus inflammatory process.  He is scheduled for bronchoscopy with airway sampling to further assess.    Procedure has been explained to the patient.  Risks have been detailed as bleeding, infection, pneumothorax, and non-diagnosis.  Coralyn Helling, MD [facility] Pager:  [phone] 07/28/2012, 12:44 PM

## 2012-07-29 ENCOUNTER — Encounter (HOSPITAL_COMMUNITY): Payer: Self-pay | Admitting: Pulmonary Disease

## 2012-07-30 ENCOUNTER — Telehealth: Payer: Self-pay | Admitting: Pulmonary Disease

## 2012-07-30 NOTE — Telephone Encounter (Signed)
I spoke with pt and is requesting bronch results. Please advise VS thanks

## 2012-07-30 NOTE — Telephone Encounter (Signed)
Please inform pt that results are still pending.  Will call him as soon as results are available.

## 2012-07-30 NOTE — Telephone Encounter (Signed)
Pt is aware. Nothing further needed 

## 2012-07-31 ENCOUNTER — Ambulatory Visit (INDEPENDENT_AMBULATORY_CARE_PROVIDER_SITE_OTHER)
Admission: RE | Admit: 2012-07-31 | Discharge: 2012-07-31 | Disposition: A | Discharge status: Home / Self Care | Payer: Medicare Other | Source: Ambulatory Visit | Attending: Adult Health | Admitting: Adult Health

## 2012-07-31 ENCOUNTER — Telehealth: Payer: Self-pay | Admitting: Pulmonary Disease

## 2012-07-31 ENCOUNTER — Ambulatory Visit (INDEPENDENT_AMBULATORY_CARE_PROVIDER_SITE_OTHER): Payer: Medicare Other | Admitting: Adult Health

## 2012-07-31 ENCOUNTER — Other Ambulatory Visit: Payer: Medicare Other

## 2012-07-31 ENCOUNTER — Encounter: Payer: Self-pay | Admitting: Adult Health

## 2012-07-31 VITALS — BP 104/64 | HR 111 | Temp 99.5°F | Ht 72.0 in | Wt 258.6 lb

## 2012-07-31 DIAGNOSIS — J449 Chronic obstructive pulmonary disease, unspecified: Secondary | ICD-10-CM

## 2012-07-31 DIAGNOSIS — R918 Other nonspecific abnormal finding of lung field: Secondary | ICD-10-CM

## 2012-07-31 DIAGNOSIS — R222 Localized swelling, mass and lump, trunk: Secondary | ICD-10-CM

## 2012-07-31 DIAGNOSIS — J209 Acute bronchitis, unspecified: Secondary | ICD-10-CM

## 2012-07-31 LAB — BODY FLUID CULTURE

## 2012-07-31 MED ORDER — AMOXICILLIN-POT CLAVULANATE 875-125 MG PO TABS: 1.0000 | ORAL_TABLET | Freq: Two times a day (BID) | ORAL | Status: AC

## 2012-07-31 MED ORDER — HYDROCODONE-HOMATROPINE 5-1.5 MG/5ML PO SYRP: 5.0000 mL | ORAL_SOLUTION | Freq: Four times a day (QID) | ORAL | Status: AC | PRN

## 2012-07-31 MED ORDER — PREDNISONE 10 MG PO TABS: ORAL_TABLET | ORAL | Status: DC

## 2012-07-31 MED ORDER — LEVALBUTEROL HCL 0.63 MG/3ML IN NEBU
0.6300 mg | INHALATION_SOLUTION | Freq: Once | RESPIRATORY_TRACT | Status: AC
  Administered 2012-07-31: 0.63 mg via RESPIRATORY_TRACT

## 2012-07-31 NOTE — Telephone Encounter (Signed)
Called and spoke with pt and he has temp of 99.0, chills last night and coughing all night long with normal sputum, breathing feels labored and raspy and rattling in his lungs.  He stated that this really feels worse on the left side.  He stated that he has to sleep flat on his back and is not able to sleep on either side.  Pt was added to TP schedule today per pts request. Nothing further is needed.

## 2012-07-31 NOTE — Progress Notes (Signed)
  Subjective:    Patient ID: Travis Ferguson, male    DOB: 04-21-1941, 72 y.o.   MRN: [id number]  HPI 72 yo with known hx of COPD, insomnia, and mild OSA.   TESTS: PSG May 2008>> AHI 8  Spirometry 11/18/08>>FEV1 2.15 (61%), FEV1% 62  CT chest 05/19/12>>superior segment LLL infiltrate, b/l hilar and mediastinal LAN with calcification   07/31/12 Acute OV Complains increased SOB, wheezing, chills, low grade temp, prod cough with green/yellow mucus since 07-28-12 bronch, worse this AM. Underwent FOB on 07/28/12 lung mass > pathology showed granulomas in bx, atypical squamous cells (concerning, but not diagnostic for malignancy) in BAL, and acute inflammatory cells in brushing.  He denies chest pain , hemoptysis, edema or rash.  CXR today shows NAD .  Son has hx of sarcoid.      Review of Systems Constitutional:   No  weight loss, night sweats,  Fevers, chills, fatigue, or  lassitude.  HEENT:   No headaches,  Difficulty swallowing,  Tooth/dental problems, or  Sore throat,                No sneezing, itching, ear ache,  +nasal congestion, post nasal drip,   CV:  No chest pain,  Orthopnea, PND, swelling in lower extremities, anasarca, dizziness, palpitations, syncope.   GI  No heartburn, indigestion, abdominal pain, nausea, vomiting, diarrhea, change in bowel habits, loss of appetite, bloody stools.   Resp:    No coughing up of blood.     No chest wall deformity  Skin: no rash or lesions.  GU: no dysuria, change in color of urine, no urgency or frequency.  No flank pain, no hematuria   MS:  No joint pain or swelling.  No decreased range of motion.  No back pain.  Psych:  No change in mood or affect. No depression or anxiety.  No memory loss.         Objective:   Physical Exam GEN: A/Ox3; pleasant , NAD  HEENT:  [place]/AT,  EACs-clear, TMs-wnl, NOSE-clear, THROAT-clear, no lesions, no postnasal drip or exudate noted.   NECK:  Supple w/ fair ROM; no JVD; normal carotid impulses w/o  bruits; no thyromegaly or nodules palpated; no lymphadenopathy.  RESP  Few rhonchi noted .no accessory muscle use, no dullness to percussion  CARD:  RRR, no m/r/g  , no peripheral edema, pulses intact, no cyanosis or clubbing.  GI:   Soft & nt; nml bowel sounds; no organomegaly or masses detected.  Musco: Warm bil, no deformities or joint swelling noted.   Neuro: alert, no focal deficits noted.    Skin: Warm, no lesions or rashes         Assessment & Plan:

## 2012-07-31 NOTE — Telephone Encounter (Signed)
Bronchoscopy shows granulomas in bx, atypical squamous cells (concerning, but not diagnostic for malignancy) in BAL, and acute inflammatory cells in brushing.  Concern is that he could have two processes >> sarcoidosis +/- malignancy.  Of note is that his son is being treated for sarcoidosis also.  Call to speak with pt about results.  Informed by pt's wife that he is in pulmonary office for acute visit seeing Tammy Parrett.  Discussed results with Tammy Parrett who will inform patient about results.  Will treat for acute bronchitis, start prednisone, check ACE level.  Will then need to follow up CT chest in several weeks, and then determine if repeat biopsy attempt is needed to further excluded malignancy.

## 2012-07-31 NOTE — Patient Instructions (Addendum)
Augmentin 875mg   Twice daily  For 7 days  Mucinex DM Twice daily  As needed  Cough/congestion  Fluids and rest  Hydromet 1/2 -1 tsp every 8hr As needed  Cough, may make you sleepy.  Prednisone taper -40mg  daily x 3 d, 30mg  daily x 3 d , then 20mg  daily -hold at this dose.  follow up Dr. Craige Cotta  As planned next week and As needed   I will call with lab results.

## 2012-08-03 ENCOUNTER — Encounter: Payer: Self-pay | Admitting: Adult Health

## 2012-08-03 DIAGNOSIS — J209 Acute bronchitis, unspecified: Secondary | ICD-10-CM | POA: Insufficient documentation

## 2012-08-03 NOTE — Assessment & Plan Note (Addendum)
Mild flare   Plan  Augmentin 875mg   Twice daily  For 7 days  Mucinex DM Twice daily  As needed  Cough/congestion  Fluids and rest  Hydromet 1/2 -1 tsp every 8hr As needed  Cough, may make you sleepy.  Prednisone taper -40mg  daily x 3 d, 30mg  daily x 3 d , then 20mg  daily -hold at this dose.  follow up Dr. Craige Cotta  As planned next week and As needed   I will call with lab results.

## 2012-08-03 NOTE — Assessment & Plan Note (Addendum)
FOB path >shows granulomas in bx, atypical squamous cells (concerning, but not diagnostic for malignancy) in BAL, and acute inflammatory cells in brushing.  Son has dx of sarcoid  ? Sarcoid +/- malignancy  Will check ACE level today  Steroid challenge and have him return for short term follow up  May repeat CT to see if any response to steroids in 6 weeks , if not will need to look for alternative bx options.   Plan  Augmentin 875mg   Twice daily  For 7 days  Mucinex DM Twice daily  As needed  Cough/congestion  Fluids and rest  Hydromet 1/2 -1 tsp every 8hr As needed  Cough, may make you sleepy.  Prednisone taper -40mg  daily x 3 d, 30mg  daily x 3 d , then 20mg  daily -hold at this dose.  follow up Dr. Craige Cotta  As planned next week and As needed   I will call with lab results.

## 2012-08-04 ENCOUNTER — Telehealth: Payer: Self-pay | Admitting: Pulmonary Disease

## 2012-08-04 NOTE — Telephone Encounter (Signed)
May stop augmentin.  Take align daily  Eat yogurt.  May take 1 dose of Immodium a d today  If not improving will need to see PCP.  Push fluids, propel, gatorade, etc.  Please contact office for sooner follow up if symptoms do not improve or worsen or seek emergency care  follow up with Dr. Craige Cotta  As planned and As needed   If breathing is not improving or worsens , will need sooner ov.

## 2012-08-04 NOTE — Telephone Encounter (Signed)
I spoke with spouse. She stated the Augmentin has caused pt to have diarrhea and is worse today. Pt did not eat yogurt ot take align while on ABX. Pt saw TP on 07/31/12  Please advise TP thanks   No Known Allergies

## 2012-08-04 NOTE — Progress Notes (Signed)
Reviewed and agree with assessment/plan. 

## 2012-08-04 NOTE — Telephone Encounter (Signed)
I spoke with spouse and is aware of TP recs. She voiced her understanding of recs. She will inform pt. Nothing further was needed

## 2012-08-05 NOTE — Telephone Encounter (Signed)
Had called spoken with patient to advise of 2.7.14 lab results.  Pt stated that his diarrhea had not yet subsided, reported that he has taken the imodium w/ no relief > vomited last evening, had diarrhea 6x.  Asked for further recs from TP though I advised pt he needed to contact his PCP as recommended yesterday.  Spoke with TP: he needs to call his PCP to see if he can be worked in today.  Otherwise will need to go to the ER for fluids.  Called spoke with patient who corrected that he has NOT taken the imodium as directed yesterday b/c he didn't have any at the home.  Advised pt to go ahead and call his PCP for recs because of the impending inclement weather.  Pt verbalized his understanding.

## 2012-08-05 NOTE — Progress Notes (Signed)
Quick Note:  Called spoke with patient, advised of lab results / recs as stated by TP. Pt verbalized his understanding and denied any questions. ______ 

## 2012-08-06 ENCOUNTER — Ambulatory Visit: Payer: Medicare Other | Admitting: Pulmonary Disease

## 2012-08-07 ENCOUNTER — Ambulatory Visit: Payer: Medicare Other | Admitting: Adult Health

## 2012-08-10 ENCOUNTER — Encounter: Payer: Self-pay | Admitting: Adult Health

## 2012-08-10 ENCOUNTER — Ambulatory Visit (INDEPENDENT_AMBULATORY_CARE_PROVIDER_SITE_OTHER): Payer: Medicare Other | Admitting: Adult Health

## 2012-08-10 VITALS — BP 120/72 | HR 87 | Temp 97.0°F | Ht 72.0 in | Wt 257.2 lb

## 2012-08-10 DIAGNOSIS — R222 Localized swelling, mass and lump, trunk: Secondary | ICD-10-CM

## 2012-08-10 DIAGNOSIS — R918 Other nonspecific abnormal finding of lung field: Secondary | ICD-10-CM

## 2012-08-10 NOTE — Patient Instructions (Addendum)
Hold prednisone 20mg  daily  follow up Dr. Craige Cotta in 3 weeks as planned and As needed

## 2012-08-10 NOTE — Progress Notes (Signed)
  Subjective:    Patient ID: Travis Ferguson, male    DOB: 1941-03-20, 72 y.o.   MRN: [id number]  HPI  72 yo with known hx of COPD, insomnia, and mild OSA.   TESTS: PSG May 2008>> AHI 8  Spirometry 11/18/08>>FEV1 2.15 (61%), FEV1% 62  CT chest 05/19/12>>superior segment LLL infiltrate, b/l hilar and mediastinal LAN with calcification   07/31/12 Acute OV Complains increased SOB, wheezing, chills, low grade temp, prod cough with green/yellow mucus since 07-28-12 bronch, worse this AM. Underwent FOB on 07/28/12 lung mass > pathology showed granulomas in bx, atypical squamous cells (concerning, but not diagnostic for malignancy) in BAL, and acute inflammatory cells in brushing.  He denies chest pain , hemoptysis, edema or rash.  CXR today shows NAD .  Son has hx of sarcoid.   08/10/2012 Follow up  Pt returns for follow up  Was seen 2 weeks ago for cough and congestion after FOB FOB path >showed granulomas in bx, atypical squamous cells (concerning, but not diagnostic for malignancy) in BAL, and acute inflammatory cells in brushing.   Was concerned for possible   ? Sarcoid +/- malignancy  ACE was 37  He was started on Steroid challenge w/ taper to 20mg  and hold.  Plan was to have him return in 6 weeks with possible repeat CT to check on response to streoids.  He was also given augmentin. Unable to tolerate due to diarrhea.  Says he feels so much better. Cough is totally gone.  Dyspnea is much lesss.  Diarrhea gone off augmentin       Review of Systems  Constitutional:   No  weight loss, night sweats,  Fevers, chills, fatigue, or  lassitude.  HEENT:   No headaches,  Difficulty swallowing,  Tooth/dental problems, or  Sore throat,                No sneezing, itching, ear ache,  +nasal congestion, post nasal drip,   CV:  No chest pain,  Orthopnea, PND, swelling in lower extremities, anasarca, dizziness, palpitations, syncope.   GI  No heartburn, indigestion, abdominal pain, nausea,  vomiting, diarrhea, change in bowel habits, loss of appetite, bloody stools.   Resp:    No coughing up of blood.     No chest wall deformity  Skin: no rash or lesions.  GU: no dysuria, change in color of urine, no urgency or frequency.  No flank pain, no hematuria   MS:  No joint pain or swelling.  No decreased range of motion.  No back pain.  Psych:  No change in mood or affect. No depression or anxiety.  No memory loss.         Objective:   Physical Exam  GEN: A/Ox3; pleasant , NAD  HEENT:  [place]/AT,  EACs-clear, TMs-wnl, NOSE-clear, THROAT-clear, no lesions, no postnasal drip or exudate noted.   NECK:  Supple w/ fair ROM; no JVD; normal carotid impulses w/o bruits; no thyromegaly or nodules palpated; no lymphadenopathy.  RESP  CTA .no accessory muscle use, no dullness to percussion  CARD:  RRR, no m/r/g  , no peripheral edema, pulses intact, no cyanosis or clubbing.  GI:   Soft & nt; nml bowel sounds; no organomegaly or masses detected.  Musco: Warm bil, no deformities or joint swelling noted.   Neuro: alert, no focal deficits noted.    Skin: Warm, no lesions or rashes         Assessment & Plan:

## 2012-08-10 NOTE — Assessment & Plan Note (Signed)
FOB path >shows granulomas in bx, atypical squamous cells (concerning, but not diagnostic for malignancy) in BAL, and acute inflammatory cells in brushing.  Son has dx of sarcoid  ? Sarcoid +/- malignancy    Steroid challenge and have him return for short term follow up  May repeat CT to see if any response to steroids in 6 weeks , if not will need to look for alternative bx options.    Hold pred at 20mg  until seen back in office on 3/5

## 2012-08-10 NOTE — Progress Notes (Signed)
Reviewed and agree with assessment/plan. 

## 2012-08-26 ENCOUNTER — Encounter: Payer: Self-pay | Admitting: Pulmonary Disease

## 2012-08-26 ENCOUNTER — Ambulatory Visit (INDEPENDENT_AMBULATORY_CARE_PROVIDER_SITE_OTHER): Payer: Medicare Other | Admitting: Pulmonary Disease

## 2012-08-26 ENCOUNTER — Other Ambulatory Visit (INDEPENDENT_AMBULATORY_CARE_PROVIDER_SITE_OTHER): Payer: Medicare Other

## 2012-08-26 ENCOUNTER — Telehealth: Payer: Self-pay | Admitting: Pulmonary Disease

## 2012-08-26 VITALS — BP 110/84 | HR 92 | Temp 97.0°F | Ht 72.0 in | Wt 260.8 lb

## 2012-08-26 DIAGNOSIS — G4733 Obstructive sleep apnea (adult) (pediatric): Secondary | ICD-10-CM

## 2012-08-26 DIAGNOSIS — D869 Sarcoidosis, unspecified: Secondary | ICD-10-CM

## 2012-08-26 DIAGNOSIS — R918 Other nonspecific abnormal finding of lung field: Secondary | ICD-10-CM

## 2012-08-26 DIAGNOSIS — J4489 Other specified chronic obstructive pulmonary disease: Secondary | ICD-10-CM

## 2012-08-26 LAB — BASIC METABOLIC PANEL
BUN: 15 mg/dL (ref 6–23)
CO2: 24 mEq/L (ref 19–32)
Calcium: 9.4 mg/dL (ref 8.4–10.5)
Creatinine, Ser: 1 mg/dL (ref 0.4–1.5)
GFR: 76.29 mL/min (ref >60.00)
Glucose, Bld: 132 mg/dL — ABNORMAL HIGH (ref 70–99)

## 2012-08-26 MED ORDER — PREDNISONE 10 MG PO TABS: 20.0000 mg | ORAL_TABLET | Freq: Every day | ORAL | Status: DC

## 2012-08-26 NOTE — Progress Notes (Signed)
Chief Complaint  Patient presents with  . Follow-up    Pt reports breathing is okay. c/o nasal congestion, PND, blowing out white-yellow phlem, scratchy throat comes and goes. C/o dry cough x this AM. no wheezing, no chest tx    History of Present Illness: Travis Ferguson is a 72 y.o. 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  He has been doing better since last visit with Tammy after bronchoscopy.  He remains on 20 mg prednisone per day.  He is not having much cough, wheeze, or sputum.  He denies fever, chest pain, or hemoptysis.  He is very nervous about whether he could still have cancer.   TESTS: PSG May 2008>> AHI 8  Spirometry 11/18/08>>FEV1 2.15 (61%), FEV1% 62  CT chest 05/19/12>>superior segment LLL infiltrate, b/l hilar and mediastinal LAN with calcification CT chest 07/13/12 >> atherosclerosis, Lt hilar adenopathy, LLL superior segment narrowing, mass like opacity superior segment LLL 5.4 x 1.9 cm, multiple nodular areas LLL, diffuse GGO LLL PET scan 07/22/12 >> mildly hypermetabolic mediastinal adenopathy (SUV 5.8), superior segment nodules hypermetabolic (SUV 12.1) Bronchoscopy 07/28/12 >>  08/01/15 >>start prednisone trial; ACE 37  Travis Ferguson  has a past medical history of COPD (chronic obstructive pulmonary disease); Depression; PTSD (post-traumatic stress disorder); OSA (obstructive sleep apnea); Migraine; GERD (gastroesophageal reflux disease); Dyslipidemia; Rhinitis; Cervical disc disease; and Subdural hematoma (11/10).  Travis Ferguson  has past surgical history that includes Craniotomy (11/10) and Video bronchoscopy (07/28/2012).  Prior to Admission medications   Medication Sig Start Date End Date Taking? Authorizing Arien Benincasa  acetaminophen (TYLENOL) 500 MG tablet Take 1,000 mg by mouth as needed.    Yes Historical Mihaela Fajardo, MD  albuterol (PROVENTIL HFA;VENTOLIN HFA) 108 (90 BASE) MCG/ACT inhaler 1-2 puffs every 4-6 hours as needed  05/18/12  Yes Coralyn Helling, MD  budesonide-formoterol (SYMBICORT) 160-4.5 MCG/ACT inhaler Inhale 2 puffs into the lungs 2 (two) times daily. 05/11/12  Yes Tammy S Parrett, NP  ezetimibe-simvastatin (VYTORIN) 10-40 MG per tablet Take 1 tablet by mouth at bedtime.     Yes Historical Avy Barlett, MD  FLUoxetine (PROZAC) 40 MG capsule Take 40 mg by mouth daily.     Yes Historical Arlinda Barcelona, MD  losartan-hydrochlorothiazide (HYZAAR) 50-12.5 MG per tablet 1/2 tab by mouth once daily 03/23/12  Yes Historical Mitchelle Sultan, MD  metoprolol tartrate (LOPRESSOR) 25 MG tablet Take 1 tablet by mouth daily. 04/06/12  Yes Historical Skyrah Krupp, MD  Multiple Vitamin (MULTIVITAMIN) capsule Take 1 capsule by mouth daily.     Yes Historical Zeriah Baysinger, MD  Omega-3 Fatty Acids (EQL OMEGA 3 FISH OIL) 1200 MG CPDR Take 1 capsule by mouth daily.    Yes Historical Alcee Sipos, MD  Probiotic Product (PROBIOTIC DAILY PO) Take by mouth.   Yes Historical Tashica Provencio, MD  tiotropium (SPIRIVA) 18 MCG inhalation capsule Place 1 capsule (18 mcg total) into inhaler and inhale daily. 01/28/12  Yes Coralyn Helling, MD    No Known Allergies   Physical Exam:  General - No distress ENT - No sinus tenderness, no oral exudate, no LAN Cardiac - s1s2 regular, no murmur Chest - No wheeze/rales/dullness Back - No focal tenderness Abd - Soft, non-tender Ext - No edema Neuro - Normal strength Skin - No rashes Psych - normal mood, and behavior   Assessment/Plan:  Coralyn Helling, MD [place] [facility]/Critical Care/Sleep Pager:  [phone] 08/26/2012, 9:07 AM

## 2012-08-26 NOTE — Telephone Encounter (Signed)
BMET    Component Value Date/Time   NA 138 08/26/2012 0945   K 3.8 08/26/2012 0945   CL 105 08/26/2012 0945   CO2 24 08/26/2012 0945   GLUCOSE 132* 08/26/2012 0945   BUN 15 08/26/2012 0945   CREATININE 1.0 08/26/2012 0945   CALCIUM 9.4 08/26/2012 0945   GFRNONAA >60 05/12/2009 0400   GFRAA  Value: >60        The eGFR has been calculated using the MDRD equation. This calculation has not been validated in all clinical situations. eGFR's persistently <60 mL/min signify possible Chronic Kidney Disease. 05/12/2009 0400     Will have my nurse inform pt that lab test was normal.  Will call once CT chest reviewed.

## 2012-08-26 NOTE — Patient Instructions (Signed)
Prednisone 20 mg daily Will schedule CT chest and call with results Lab test today Follow up in 4 weeks

## 2012-08-27 NOTE — Assessment & Plan Note (Signed)
Defer therapy per patient preference.

## 2012-08-27 NOTE — Assessment & Plan Note (Signed)
He is to continue his current inhaler regimen.

## 2012-08-27 NOTE — Assessment & Plan Note (Signed)
Bronchoscopy from 07/28/12  shows granulomas in bx, atypical squamous cells (concerning, but not diagnostic for malignancy) in BAL, and acute inflammatory cells in brushing.   Concern is that he could have two processes >> sarcoidosis +/- malignancy. Of note is that his son is being treated for sarcoidosis also.   He is to continue 20 mg prednisone.  Will repeat CT chest with contrast and call him with results.  If lesions have not improved or increased, then he will need repeat biopsy (likely EBUS +/- ENB under general anesthesia).

## 2012-08-31 ENCOUNTER — Ambulatory Visit (INDEPENDENT_AMBULATORY_CARE_PROVIDER_SITE_OTHER)
Admission: RE | Admit: 2012-08-31 | Discharge: 2012-08-31 | Disposition: A | Discharge status: Home / Self Care | Payer: Medicare Other | Source: Ambulatory Visit | Attending: Pulmonary Disease | Admitting: Pulmonary Disease

## 2012-08-31 MED ORDER — IOHEXOL 300 MG/ML  SOLN
80.0000 mL | Freq: Once | INTRAMUSCULAR | Status: AC | PRN
  Administered 2012-08-31: 80 mL via INTRAVENOUS

## 2012-08-31 NOTE — Telephone Encounter (Signed)
[id number] x1 

## 2012-08-31 NOTE — Telephone Encounter (Signed)
I spoke with patient about results and he verbalized understanding and had no questions. Pt stated he would like the results of his CT ASAP. Pt is aware VS is out of the office until tomorrow. He would like a call as soon as he gets in. Please advise Dr. Craige Cotta thanks.   -cell # is [phone]

## 2012-09-01 ENCOUNTER — Telehealth: Payer: Self-pay | Admitting: Pulmonary Disease

## 2012-09-01 NOTE — Telephone Encounter (Signed)
Spoke with pt.  Please seen phone note from 09/01/12.

## 2012-09-01 NOTE — Telephone Encounter (Signed)
Ct Chest W Contrast  08/31/2012  *RADIOLOGY REPORT*  Clinical Data: Follow-up sarcoid.  Lung mass with history of smoking.  Hemoptysis.  CT CHEST WITH CONTRAST  Technique:  Multidetector CT imaging of the chest was performed following the standard protocol during bolus administration of intravenous contrast.  Contrast: 80mL OMNIPAQUE IOHEXOL 300 MG/ML  SOLN  Comparison: PET 07/22/2012 and CT chest 07/13/2012, 05/15/2012.  Findings: There are calcified and noncalcified mediastinal and hilar lymph nodes.  No axillary adenopathy.  Heart size normal. Coronary artery calcification.  No pericardial effusion.  Image quality is degraded by respiratory motion.  There is peribronchovascular airspace consolidation in the superior segment left lower lobe which appears grossly unchanged from baseline examination of 05/15/2012. Minimal scattered peribronchovascular nodularity.  Patchy ground-glass in the mid and lower lung zones, as before.  Scattered calcified granulomas.  No pleural fluid. Airway is unremarkable.  Incidental imaging of the upper abdomen shows high attenuation within the gallbladder.  No acute findings.  No worrisome lytic or sclerotic lesions.  Degenerative changes are seen in the spine.  IMPRESSION:  1.  Constellation of pulmonary parenchymal findings appears relatively unchanged from baseline examination of 05/15/2012, and may relate to the given diagnosis of sarcoid. 2.  Cholelithiasis.   Original Report Authenticated By: Leanna Battles, M.D.    In my review of CT findings are stable to slightly improved with some decrease in size of nodularity in LLL.  Still has narrowing/abnormality leading to superior segment of left lower lobe.  This shows some decrease, but is still persistent.   Results d/w Mr. Davoli.  Will review images with my partners who do EBUS/ENB, and then decide if additional bronchoscopy needs to be done for area in superior segment Lt lower lobe.

## 2012-09-04 NOTE — Telephone Encounter (Signed)
Reviewed CT chest with partner.  Thinking is that overall there is improvement in radiographic appearance with prednisone therapy.  Will continue same, and f/u in office.  Will then determine when to repeat CT chest.  Will defer additional biopsy attempts for now.  Advised pt that he could call back with questions.

## 2012-09-23 ENCOUNTER — Ambulatory Visit (INDEPENDENT_AMBULATORY_CARE_PROVIDER_SITE_OTHER): Payer: Medicare Other | Admitting: Pulmonary Disease

## 2012-09-23 ENCOUNTER — Encounter: Payer: Self-pay | Admitting: Pulmonary Disease

## 2012-09-23 VITALS — BP 122/70 | HR 92 | Temp 97.3°F | Ht 72.0 in | Wt 258.8 lb

## 2012-09-23 DIAGNOSIS — J449 Chronic obstructive pulmonary disease, unspecified: Secondary | ICD-10-CM

## 2012-09-23 DIAGNOSIS — R918 Other nonspecific abnormal finding of lung field: Secondary | ICD-10-CM

## 2012-09-23 DIAGNOSIS — R079 Chest pain, unspecified: Secondary | ICD-10-CM | POA: Insufficient documentation

## 2012-09-23 DIAGNOSIS — R222 Localized swelling, mass and lump, trunk: Secondary | ICD-10-CM

## 2012-09-23 MED ORDER — PREDNISONE 5 MG PO TABS: ORAL_TABLET | ORAL | Status: DC

## 2012-09-23 NOTE — Progress Notes (Signed)
Chief Complaint  Patient presents with  . Follow-up    Pt reports breathing is okay as long as he is resting. C/o nasal congestion. When he blows his nose some mucus is dark red, PND at night, slight dry cough, chest tx. Denies any wheezing Using saline spray. Would like to discuss CT is more detail. Also wants to know if he needs to continue taking prednisone.     History of Present Illness: Travis Ferguson is a 72 y.o. 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  He has sinus congestion for the past week.  He denies cough, wheeze, or sputum.  He has noticed heavy feeling in mid chest with exertion (hauling branches).  This gets better after he rests for few minutes.  TESTS: PSG May 2008>> AHI 8  Spirometry 11/18/08>>FEV1 2.15 (61%), FEV1% 62  CT chest 05/19/12>>superior segment LLL infiltrate, b/l hilar and mediastinal LAN with calcification CT chest 07/13/12 >> atherosclerosis, Lt hilar adenopathy, LLL superior segment narrowing, mass like opacity superior segment LLL 5.4 x 1.9 cm, multiple nodular areas LLL, diffuse GGO LLL PET scan 07/22/12 >> mildly hypermetabolic mediastinal adenopathy (SUV 5.8), superior segment nodules hypermetabolic (SUV 12.1) Bronchoscopy 07/28/12 >>  08/01/15 >>start prednisone trial; ACE 37 CT chest 08/31/12 >> decreased size of lesions  Travis Ferguson  has a past medical history of COPD (chronic obstructive pulmonary disease); Depression; PTSD (post-traumatic stress disorder); OSA (obstructive sleep apnea); Migraine; GERD (gastroesophageal reflux disease); Dyslipidemia; Rhinitis; Cervical disc disease; and Subdural hematoma (11/10).  Travis Ferguson  has past surgical history that includes Craniotomy (11/10) and Video bronchoscopy (07/28/2012).  Prior to Admission medications   Medication Sig Start Date End Date Taking? Authorizing Provider  acetaminophen (TYLENOL) 500 MG tablet Take 1,000 mg by mouth as needed.    Yes  Historical Provider, MD  albuterol (PROVENTIL HFA;VENTOLIN HFA) 108 (90 BASE) MCG/ACT inhaler 1-2 puffs every 4-6 hours as needed 05/18/12  Yes Coralyn Helling, MD  budesonide-formoterol (SYMBICORT) 160-4.5 MCG/ACT inhaler Inhale 2 puffs into the lungs 2 (two) times daily. 05/11/12  Yes Tammy S Parrett, NP  ezetimibe-simvastatin (VYTORIN) 10-40 MG per tablet Take 1 tablet by mouth at bedtime.     Yes Historical Provider, MD  FLUoxetine (PROZAC) 40 MG capsule Take 40 mg by mouth daily.     Yes Historical Provider, MD  losartan-hydrochlorothiazide (HYZAAR) 50-12.5 MG per tablet 1/2 tab by mouth once daily 03/23/12  Yes Historical Provider, MD  metoprolol tartrate (LOPRESSOR) 25 MG tablet Take 1 tablet by mouth daily. 04/06/12  Yes Historical Provider, MD  Multiple Vitamin (MULTIVITAMIN) capsule Take 1 capsule by mouth daily.     Yes Historical Provider, MD  Omega-3 Fatty Acids (EQL OMEGA 3 FISH OIL) 1200 MG CPDR Take 1 capsule by mouth daily.    Yes Historical Provider, MD  Probiotic Product (PROBIOTIC DAILY PO) Take by mouth.   Yes Historical Provider, MD  tiotropium (SPIRIVA) 18 MCG inhalation capsule Place 1 capsule (18 mcg total) into inhaler and inhale daily. 01/28/12  Yes Coralyn Helling, MD    No Known Allergies   Physical Exam:  General - No distress ENT - No sinus tenderness, no oral exudate, no LAN Cardiac - s1s2 regular, no murmur Chest - No wheeze/rales/dullness Back - No focal tenderness Abd - Soft, non-tender Ext - No edema Neuro - Normal strength Skin - No rashes Psych - normal mood, and behavior  08/31/2012  *RADIOLOGY REPORT*  Clinical Data: Follow-up  sarcoid.  Lung mass with history of smoking.  Hemoptysis.  CT CHEST WITH CONTRAST  Technique:  Multidetector CT imaging of the chest was performed following the standard protocol during bolus administration of intravenous contrast.  Contrast: 80mL OMNIPAQUE IOHEXOL 300 MG/ML  SOLN  Comparison: PET 07/22/2012 and CT chest 07/13/2012,  05/15/2012.  Findings: There are calcified and noncalcified mediastinal and hilar lymph nodes.  No axillary adenopathy.  Heart size normal. Coronary artery calcification.  No pericardial effusion.  Image quality is degraded by respiratory motion.  There is peribronchovascular airspace consolidation in the superior segment left lower lobe which appears grossly unchanged from baseline examination of 05/15/2012. Minimal scattered peribronchovascular nodularity.  Patchy ground-glass in the mid and lower lung zones, as before.  Scattered calcified granulomas.  No pleural fluid. Airway is unremarkable.  Incidental imaging of the upper abdomen shows high attenuation within the gallbladder.  No acute findings.  No worrisome lytic or sclerotic lesions.  Degenerative changes are seen in the spine.  IMPRESSION:  1.  Constellation of pulmonary parenchymal findings appears relatively unchanged from baseline examination of 05/15/2012, and may relate to the given diagnosis of sarcoid. 2.  Cholelithiasis.   Original Report Authenticated By: Leanna Battles, M.D.      Assessment/Plan:  Coralyn Helling, MD [place] [facility]/Critical Care/Sleep Pager:  [phone] 09/23/2012, 10:29 AM

## 2012-09-23 NOTE — Assessment & Plan Note (Signed)
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  He has radiographic improvement with prednisone.  Will continue to gradually decrease dose, and plan for follow up CT in 1 or 2 months depending on his status.

## 2012-09-23 NOTE — Assessment & Plan Note (Signed)
He reports exertional chest pain.  He has coronary calcification on CT chest.  Advised him to d/w his PCP.

## 2012-09-23 NOTE — Patient Instructions (Signed)
Prednisone 5 mg pills >> two pills daily for 2 weeks, then 1 pill daily Follow up in 4 weeks

## 2012-09-23 NOTE — Assessment & Plan Note (Signed)
He is to continue his current inhaler regimen.

## 2012-09-24 ENCOUNTER — Other Ambulatory Visit (HOSPITAL_COMMUNITY): Payer: Self-pay | Admitting: Cardiovascular Disease

## 2012-09-24 DIAGNOSIS — R0602 Shortness of breath: Secondary | ICD-10-CM

## 2012-09-24 DIAGNOSIS — R079 Chest pain, unspecified: Secondary | ICD-10-CM

## 2012-10-01 ENCOUNTER — Other Ambulatory Visit (HOSPITAL_COMMUNITY): Payer: Self-pay | Admitting: Cardiovascular Disease

## 2012-10-01 DIAGNOSIS — R0602 Shortness of breath: Secondary | ICD-10-CM

## 2012-10-01 DIAGNOSIS — R079 Chest pain, unspecified: Secondary | ICD-10-CM

## 2012-10-08 ENCOUNTER — Ambulatory Visit (HOSPITAL_COMMUNITY)
Admission: RE | Admit: 2012-10-08 | Discharge: 2012-10-08 | Disposition: A | Discharge status: Home / Self Care | Payer: Medicare Other | Source: Ambulatory Visit | Attending: Cardiovascular Disease | Admitting: Cardiovascular Disease

## 2012-10-08 DIAGNOSIS — R079 Chest pain, unspecified: Secondary | ICD-10-CM | POA: Insufficient documentation

## 2012-10-08 DIAGNOSIS — R0602 Shortness of breath: Secondary | ICD-10-CM

## 2012-10-08 MED ORDER — TECHNETIUM TC 99M SESTAMIBI GENERIC - CARDIOLITE
31.0000 | Freq: Once | INTRAVENOUS | Status: AC | PRN
  Administered 2012-10-08: 31 via INTRAVENOUS

## 2012-10-08 MED ORDER — AMINOPHYLLINE 25 MG/ML IV SOLN
150.0000 mg | Freq: Once | INTRAVENOUS | Status: AC
  Administered 2012-10-08: 150 mg via INTRAVENOUS

## 2012-10-08 MED ORDER — TECHNETIUM TC 99M SESTAMIBI GENERIC - CARDIOLITE
10.2000 | Freq: Once | INTRAVENOUS | Status: AC | PRN
  Administered 2012-10-08: 10 via INTRAVENOUS

## 2012-10-08 MED ORDER — REGADENOSON 0.4 MG/5ML IV SOLN
0.4000 mg | Freq: Once | INTRAVENOUS | Status: AC
  Administered 2012-10-08: 0.4 mg via INTRAVENOUS

## 2012-10-08 NOTE — Progress Notes (Signed)
[facility]   2D echo completed 10/08/2012.   Cindy Rainer Mounce, RDCS  

## 2012-10-08 NOTE — Procedures (Addendum)
[facility] [address] [place] [address] [place] [address] [phone]  Cardiology Nuclear Med Study  Travis Ferguson is a 72 y.o. 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 Weight:  256 lb (116.121 kg)   Tech Comments:  N/A    Nuclear Med Study 1 or 2 day study: 1 day  Stress Test Type:  Lexiscan  Order Authorizing Provider:  Nanetta Batty, MD   Resting Radionuclide: Technetium 31m Sestamibi  Resting Radionuclide Dose: 10.2 mCi   Stress Radionuclide:  Technetium 64m Sestamibi  Stress Radionuclide Dose: 31.0 mCi           Stress Protocol Rest HR: 106 Stress HR: 114  Rest BP: 134/96 Stress BP: 149/97  Exercise Time (min): n/a METS: n/a   Predicted Max HR: 148 bpm % Max HR: 77.03 bpm Rate Pressure Product: 21308  Dose of Adenosine (mg):  n/a Dose of Lexiscan: 0.4 mg  Dose of Atropine (mg): n/a Dose of Dobutamine: n/a mcg/kg/min (at max HR)  Stress Test Technologist: Esperanza Sheets, CCT Nuclear Technologist: Gonzella Lex, CNMT   Rest Procedure:  Myocardial perfusion imaging was performed at rest 45 minutes following the intravenous administration of Technetium 52m Sestamibi. Stress Procedure:  The patient received IV Lexiscan 0.4 mg over 15-seconds.  Technetium 5m Sestamibi injected at 30-seconds.  The Patient experienced marked SOB and 150 mg IV Aminophylline was  administered with resolution of symptom(s).  There were no significant changes with Lexiscan.  Quantitative spect images were obtained after a 45 minute delay.  Transient Ischemic Dilatation (Normal <1.22):  0.83 Lung/Heart Ratio (Normal <0.45):  0.33 QGS EDV:  n/a ml QGS ESV:  n/a ml LV Ejection Fraction: Study not gated  Rest ECG: NSR - Normal EKG  Stress ECG: No significant ST segment change suggestive of ischemia. and There are scattered PVCs.  QPS Raw Data Images:  There is interference from nuclear activity from structures below the diaphragm. This does not affect the ability to read the study. Stress Images:  There is decreased uptake in the inferior wall. Rest Images:  There is decreased uptake in the inferior wall. Subtraction (SDS):  There is a fixed inferior defect that is most consistent with diaphragmatic attenuation.  Impression Exercise Capacity:  Lexiscan with no exercise. BP Response:  Normal blood pressure response. Clinical Symptoms:  Mild dyspnea ECG Impression:  There are scattered PVCs. Comparison with Prior Nuclear Study: No previous nuclear study performed  Overall Impression:  Low risk stress nuclear study.  Inferior defect worse at stress than rest, likely represents bowel attenuation artifact. No ischemia.  LV Wall Motion:  No evaluated due to ectopy.  Chrystie Nose, MD, [facility] Board Certified in Nuclear Cardiology Attending Cardiologist [facility]  Chrystie Nose, MD  10/08/2012 12:54 PM

## 2012-10-29 ENCOUNTER — Encounter: Payer: Self-pay | Admitting: Pulmonary Disease

## 2012-10-29 ENCOUNTER — Ambulatory Visit (INDEPENDENT_AMBULATORY_CARE_PROVIDER_SITE_OTHER): Payer: Medicare Other | Admitting: Pulmonary Disease

## 2012-10-29 VITALS — BP 110/64 | HR 103 | Temp 98.0°F | Ht 72.0 in | Wt 257.0 lb

## 2012-10-29 DIAGNOSIS — J449 Chronic obstructive pulmonary disease, unspecified: Secondary | ICD-10-CM

## 2012-10-29 DIAGNOSIS — D869 Sarcoidosis, unspecified: Secondary | ICD-10-CM

## 2012-10-29 DIAGNOSIS — D860 Sarcoidosis of lung: Secondary | ICD-10-CM

## 2012-10-29 DIAGNOSIS — J99 Respiratory disorders in diseases classified elsewhere: Secondary | ICD-10-CM

## 2012-10-29 DIAGNOSIS — R918 Other nonspecific abnormal finding of lung field: Secondary | ICD-10-CM

## 2012-10-29 DIAGNOSIS — J019 Acute sinusitis, unspecified: Secondary | ICD-10-CM

## 2012-10-29 DIAGNOSIS — R222 Localized swelling, mass and lump, trunk: Secondary | ICD-10-CM

## 2012-10-29 MED ORDER — TRIAMCINOLONE ACETONIDE(NASAL) 55 MCG/ACT NA INHA: 2.0000 | Freq: Every day | NASAL | Status: DC

## 2012-10-29 MED ORDER — GUAIFENESIN ER 600 MG PO TB12: 1200.0000 mg | ORAL_TABLET | Freq: Two times a day (BID) | ORAL | Status: DC

## 2012-10-29 MED ORDER — AMOXICILLIN-POT CLAVULANATE 875-125 MG PO TABS: 1.0000 | ORAL_TABLET | Freq: Two times a day (BID) | ORAL | Status: DC

## 2012-10-29 NOTE — Assessment & Plan Note (Signed)
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  He has radiographic improvement with prednisone.  I don't think his current symptoms are related to progression of this.  Will have him stop prednisone.  Will then plan to repeat CT chest with Contrast in June 2014.

## 2012-10-29 NOTE — Patient Instructions (Signed)
Stop prednisone Augmentin one pill twice daily for one week >> if no better, then take for additional week Mucinex twice daily as needed for cough Nasacort two sprays each nostril daily as needed for sinus congestion Will schedule CT chest for June 2014 Follow up after CT chest in June 2014

## 2012-10-29 NOTE — Progress Notes (Signed)
Chief Complaint  Patient presents with  . Follow-up    Pt states he has been SOB. He has been coughing a lot and his ribs hurt. He is coughing up white-yellow phlem. Pt c/o PND, wheezing at night, chest tx in the mornings, every joint in his body aches. Pt states last week he received a PNA vaccine.     History of Present Illness: Travis Ferguson is a 72 y.o. 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  He had recent cardiac evaluation which was unremarkable.  He was doing okay until the past week.  He developed sinus congestion and pressure with post-nasal drip.  He is coughing with clear to yellow sputum.  His ribs are sore from coughing.  He denies fever, but has been feeling sweaty. He denies hemoptysis.  He got his pneumonia shot last week.  He has noticed wheezing and hoarseness coming from his throat.  He is down to 5 mg prednisone daily and anxious to get off prednisone.  TESTS: PSG May 2008>> AHI 8  Spirometry 11/18/08>>FEV1 2.15 (61%), FEV1% 62  CT chest 05/19/12>>superior segment LLL infiltrate, b/l hilar and mediastinal LAN with calcification CT chest 07/13/12 >> atherosclerosis, Lt hilar adenopathy, LLL superior segment narrowing, mass like opacity superior segment LLL 5.4 x 1.9 cm, multiple nodular areas LLL, diffuse GGO LLL PET scan 07/22/12 >> mildly hypermetabolic mediastinal adenopathy (SUV 5.8), superior segment nodules hypermetabolic (SUV 12.1) Bronchoscopy 07/28/12 >>  08/01/15 >>start prednisone trial; ACE 37 CT chest 08/31/12 >> decreased size of lesions  Travis Ferguson  has a past medical history of COPD (chronic obstructive pulmonary disease); Depression; PTSD (post-traumatic stress disorder); OSA (obstructive sleep apnea); Migraine; GERD (gastroesophageal reflux disease); Dyslipidemia; Rhinitis; Cervical disc disease; and Subdural hematoma (11/10).  Travis Ferguson  has past surgical history that includes Craniotomy (11/10) and Video  bronchoscopy (07/28/2012).  Prior to Admission medications   Medication Sig Start Date End Date Taking? Authorizing Provider  acetaminophen (TYLENOL) 500 MG tablet Take 1,000 mg by mouth as needed.    Yes Historical Provider, MD  albuterol (PROVENTIL HFA;VENTOLIN HFA) 108 (90 BASE) MCG/ACT inhaler 1-2 puffs every 4-6 hours as needed 05/18/12  Yes Coralyn Helling, MD  budesonide-formoterol (SYMBICORT) 160-4.5 MCG/ACT inhaler Inhale 2 puffs into the lungs 2 (two) times daily. 05/11/12  Yes Tammy S Parrett, NP  ezetimibe-simvastatin (VYTORIN) 10-40 MG per tablet Take 1 tablet by mouth at bedtime.     Yes Historical Provider, MD  FLUoxetine (PROZAC) 40 MG capsule Take 40 mg by mouth daily.     Yes Historical Provider, MD  losartan-hydrochlorothiazide (HYZAAR) 50-12.5 MG per tablet 1/2 tab by mouth once daily 03/23/12  Yes Historical Provider, MD  metoprolol tartrate (LOPRESSOR) 25 MG tablet Take 1 tablet by mouth daily. 04/06/12  Yes Historical Provider, MD  Multiple Vitamin (MULTIVITAMIN) capsule Take 1 capsule by mouth daily.     Yes Historical Provider, MD  Omega-3 Fatty Acids (EQL OMEGA 3 FISH OIL) 1200 MG CPDR Take 1 capsule by mouth daily.    Yes Historical Provider, MD  Probiotic Product (PROBIOTIC DAILY PO) Take by mouth.   Yes Historical Provider, MD  tiotropium (SPIRIVA) 18 MCG inhalation capsule Place 1 capsule (18 mcg total) into inhaler and inhale daily. 01/28/12  Yes Coralyn Helling, MD    No Known Allergies   Physical Exam:  General - No distress, moon facies ENT - Left > Right maxillary sinus tenderness, clear to yellow nasal  drainage, no oral exudate, no LAN Cardiac - s1s2 regular, no murmur Chest - Coarse breath sounds b/l with b/l rhonchi that clear with cough, no wheeze Back - No focal tenderness Abd - Soft, non-tender Ext - No edema Neuro - Normal strength Skin - No rashes Psych - normal mood, and behavior   Assessment/Plan:  Coralyn Helling, MD [place] [facility]/Critical  Care/Sleep Pager:  [phone] 10/29/2012, 3:36 PM

## 2012-10-29 NOTE — Assessment & Plan Note (Signed)
He is to continue his current inhaler regimen. 

## 2012-10-29 NOTE — Assessment & Plan Note (Signed)
I think his current symptoms are related to acute sinus infection.  Will give course of augmentin.  He can use mucinex, nasacort, and nasal irrigation.

## 2012-11-05 ENCOUNTER — Telehealth: Payer: Self-pay | Admitting: Pulmonary Disease

## 2012-11-05 NOTE — Telephone Encounter (Signed)
I explained to the pt that we do not make discs in this office I have rec that he go to medical records to have the discs he is requesting made for him Pt verbalized understanding

## 2012-11-19 ENCOUNTER — Other Ambulatory Visit: Payer: Self-pay | Admitting: Pulmonary Disease

## 2012-11-19 DIAGNOSIS — R918 Other nonspecific abnormal finding of lung field: Secondary | ICD-10-CM

## 2012-11-20 ENCOUNTER — Other Ambulatory Visit (INDEPENDENT_AMBULATORY_CARE_PROVIDER_SITE_OTHER): Payer: Medicare Other

## 2012-11-20 DIAGNOSIS — R222 Localized swelling, mass and lump, trunk: Secondary | ICD-10-CM

## 2012-11-20 DIAGNOSIS — R918 Other nonspecific abnormal finding of lung field: Secondary | ICD-10-CM

## 2012-11-20 LAB — BASIC METABOLIC PANEL
BUN: 19 mg/dL (ref 6–23)
Calcium: 9.7 mg/dL (ref 8.4–10.5)
Chloride: 107 mEq/L (ref 96–112)
Creatinine, Ser: 1 mg/dL (ref 0.4–1.5)
GFR: 82.76 mL/min (ref >60.00)

## 2012-11-30 ENCOUNTER — Other Ambulatory Visit: Payer: Self-pay | Admitting: Neurosurgery

## 2012-12-02 ENCOUNTER — Telehealth: Payer: Self-pay | Admitting: Pulmonary Disease

## 2012-12-02 ENCOUNTER — Ambulatory Visit (INDEPENDENT_AMBULATORY_CARE_PROVIDER_SITE_OTHER)
Admission: RE | Admit: 2012-12-02 | Discharge: 2012-12-02 | Disposition: A | Discharge status: Home / Self Care | Payer: Medicare Other | Source: Ambulatory Visit | Attending: Pulmonary Disease | Admitting: Pulmonary Disease

## 2012-12-02 DIAGNOSIS — D860 Sarcoidosis of lung: Secondary | ICD-10-CM

## 2012-12-02 DIAGNOSIS — J99 Respiratory disorders in diseases classified elsewhere: Secondary | ICD-10-CM

## 2012-12-02 DIAGNOSIS — D869 Sarcoidosis, unspecified: Secondary | ICD-10-CM

## 2012-12-02 MED ORDER — IOHEXOL 300 MG/ML  SOLN
80.0000 mL | Freq: Once | INTRAMUSCULAR | Status: AC | PRN
  Administered 2012-12-02: 80 mL via INTRAVENOUS

## 2012-12-02 NOTE — Telephone Encounter (Signed)
Please advise Dr. Craige Cotta pt is returning your call for CT results. thanks

## 2012-12-02 NOTE — Telephone Encounter (Signed)
Appts. Scheduled. Nothing further was needed

## 2012-12-02 NOTE — Telephone Encounter (Signed)
Ct Chest W Contrast  12/02/2012   *RADIOLOGY REPORT*  Clinical Data: Sarcoidosis.  Evaluate for pulmonary infiltrates. Hemoptysis.  CT CHEST WITH CONTRAST  Technique:  Multidetector CT imaging of the chest was performed following the standard protocol during bolus administration of intravenous contrast.  Contrast: 80mL OMNIPAQUE IOHEXOL 300 MG/ML  SOLN  Comparison: Chest CT 08/31/2012.  Findings:  Mediastinum: Heart size is normal. There is no significant pericardial fluid, thickening or pericardial calcification. There is atherosclerosis of the thoracic aorta, the great vessels of the mediastinum and the coronary arteries, including calcified atherosclerotic plaque in the left main, left anterior descending, left circumflex and right coronary arteries. Extensive mediastinal and bilateral hilar lymphadenopathy with multiple borderline enlarged and mildly enlarged lymph nodes, many of which are partially calcified, similar to the prior study.  The esophagus is unremarkable in appearance.  Lungs/Pleura: Again noted are some nodular areas of septal thickening throughout the lungs bilaterally.  Focal area of architectural distortion with extensive thickening of the peribronchovascular interstitium and conglomerate nodularity in the superior segment of the left lower lobe, similar to the prior examination.  Background of mild centrilobular emphysema.  Mild scarring in the lung bases bilaterally.  No definite acute consolidative airspace disease.  No pleural effusions.  Upper Abdomen: High attenuation material layering dependently in the gallbladder likely represents small gallstones and/or biliary sludge.  No current findings to suggest acute cholecystitis at this time.  Colonic diverticulosis.  Musculoskeletal: There are no aggressive appearing lytic or blastic lesions noted in the visualized portions of the skeleton.  IMPRESSION: 1.  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 2.  Atherosclerosis, including left main and three-vessel coronary artery disease.   Assessment for potential risk factor modification, dietary therapy or pharmacologic therapy may be warranted, if clinically indicated. 3.  Cholelithiasis and/or biliary sludge layering dependently in the gallbladder.  No current findings to suggest acute cholecystitis at this time. 4.  Colonic diverticulosis.   Original Report Authenticated By: Trudie Reed, M.D.    Left message explaining that CT chest looks stable to improved.  Findings are more in line with sarcoidosis.  Concern is much less for malignancy.  Advised him to call back to office for questions.

## 2012-12-02 NOTE — Telephone Encounter (Signed)
Discussed results with pt.    He is scheduled for ROV on 12/15/12 and his wife on 12/16/12.  Will have my nurse reschedule their visits to occur on the same day (either 06/24 or 06/25) >> okay to double book their visits.

## 2012-12-04 ENCOUNTER — Encounter (HOSPITAL_COMMUNITY): Payer: Self-pay | Admitting: Pharmacy Technician

## 2012-12-07 NOTE — Pre-Procedure Instructions (Signed)
ERICE AHLES  12/07/2012   Your procedure is scheduled on: Wednesday, December 09, 2012  Report to [facility] Stay [facility] at 12:30  PM.  Call this number if you have problems the morning of surgery: [phone]   Remember:   Do not eat food or drink liquids after midnight.   Take these medicines the morning of surgery with A SIP OF WATER: FLUoxetine (PROZAC) 40 MG capsule,             metoprolol tartrate (LOPRESSOR) 25 MG tablet , budesonide-formoterol (SYMBICORT) 160-4.5 MCG/ACT inhaler              tiotropium (SPIRIVA) 18 MCG inhalation capsule if needed:HYDROcodone-acetaminophen (NORCO/VICODIN) 5-325 MG per tablet             for pain, albuterol (PROVENTIL HFA;VENTOLIN HFA) 108 (90 BASE) MCG/ACT inhaler for wheezing ( bring in on day of             surgery)             Stop taking Aspirin, and herbal medicationsOmega-3 Fatty Acids (EQL OMEGA 3 FISH OIL) 1200 MG CPDR              Do not take any NSAIDs ie: Ibuprofen, Advil, Naproxen or any medication containing Aspirin.   Do not wear jewelry, make-up or nail polish.  Do not wear lotions, powders, or perfumes. You may wear deodorant.  Do not shave 48 hours prior to surgery. Men may shave face and neck.  Do not bring valuables to the hospital.  [facility] is not responsible  for any belongings or valuables.  Contacts, dentures or bridgework may not be worn into surgery.  Leave suitcase in the car. After surgery it may be brought to your room.  For patients admitted to the hospital, checkout time is 11:00 AM the day of discharge.   Patients discharged the day of surgery will not be allowed to drive home.  Name and phone number of your driver:   Special Instructions: Shower using CHG 2 nights before surgery and the night before surgery.  If you shower the day of surgery use CHG.  Use special wash - you have one bottle of CHG for all showers.  You should use approximately 1/3 of the bottle for each shower.   Please read over the following  fact sheets that you were given: Pain Booklet, Coughing and Deep Breathing, MRSA Information and Surgical Site Infection Prevention

## 2012-12-08 ENCOUNTER — Encounter (HOSPITAL_COMMUNITY): Payer: Self-pay | Admitting: Vascular Surgery

## 2012-12-08 ENCOUNTER — Encounter (HOSPITAL_COMMUNITY)
Admission: RE | Admit: 2012-12-08 | Discharge: 2012-12-08 | Disposition: A | Discharge status: Home / Self Care | Payer: Medicare Other | Source: Ambulatory Visit | Attending: Neurosurgery | Admitting: Neurosurgery

## 2012-12-08 ENCOUNTER — Encounter (HOSPITAL_COMMUNITY): Payer: Self-pay

## 2012-12-08 DIAGNOSIS — Z01812 Encounter for preprocedural laboratory examination: Secondary | ICD-10-CM | POA: Insufficient documentation

## 2012-12-08 DIAGNOSIS — Z01810 Encounter for preprocedural cardiovascular examination: Secondary | ICD-10-CM | POA: Insufficient documentation

## 2012-12-08 HISTORY — DX: Essential (primary) hypertension: I10

## 2012-12-08 LAB — BASIC METABOLIC PANEL
BUN: 16 mg/dL (ref 6–23)
Calcium: 9.4 mg/dL (ref 8.4–10.5)
Creatinine, Ser: 0.84 mg/dL (ref 0.50–1.35)
GFR calc Af Amer: >90 mL/min (ref >90)
GFR calc non Af Amer: 85 mL/min — ABNORMAL LOW (ref >90)
Potassium: 4.3 mEq/L (ref 3.5–5.1)

## 2012-12-08 LAB — CBC
Hemoglobin: 15.5 g/dL (ref 13.0–17.0)
MCH: 29.2 pg (ref 26.0–34.0)
MCHC: 35.4 g/dL (ref 30.0–36.0)
Platelets: 324 10*3/uL (ref 150–400)
RDW: 14.2 % (ref 11.5–15.5)

## 2012-12-08 LAB — SURGICAL PCR SCREEN: MRSA, PCR: NEGATIVE

## 2012-12-08 NOTE — Progress Notes (Signed)
Anesthesia Chart Review:  Patient is a 72 year old male scheduled for right L4-5 laminectomy/foraminotomy on 12/09/12 by Dr. Wynetta Emery.    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 He is currently being evaluated by pulmonologist Dr. Craige Cotta for mediastinal and bilateral hilar lymphadenopathy felt most consistent with Sarcoidosis and did receive a short course of Prednisone (discontinued in May 2014 by Dr. Craige Cotta).  He was treated for acute sinusitis in May.  His follow-up chest CT earlier this month was felt stable. His next pulmonary visit is on 12/15/12.  I am awaiting last cardiology office notes, but it appears he was seen by Dr. Allyson Sabal in April 2014 for evaluation of chest pain and had a low risk stress test.     Nuclear stress test on 10/08/12 showed: Overall Impression: Low risk stress nuclear study. Inferior defect worse at stress than rest, likely represents bowel attenuation artifact. No ischemia. LV Wall Motion: No evaluated due to ectopy.  Limited echo report from 10/08/12 showed:  - Aortic valve: Doppler: VTI ratio of LVOT to aortic valve: 1.02. Peak velocity ratio of LVOT to aortic valve: 0.71. Mean gradient: 2mm Hg (S). - Mitral valve: Doppler: Valve area by pressure half-time: 5.5cm^2. Indexed valve area by pressure half-time: 2.32cm^2/m^2. Mean gradient: 3mm Hg (D). - Left atrium: LA Volume/BSA 22.8 ml/m2.  EKG on 12/08/12 showed NSR, cannot rule out anterior infarct (age undetermined).  Overall, I think his EKG is stable when compared to EKG on 10/08/12 (see Muse, strips from stress test).  CXR on 07/31/12 showed no acute cardiopulmonary abnormality seen.   Preoperative labs noted.   Patient is undergoing evaluation for possible Sarcoidosis and has had close follow-up with Dr. Craige Cotta.  He also had what appears to be an unremarkable cardiology work-up.  If no acute  changes/cardiopulmonary symptoms then I would anticipate that he could proceed as planned.  [facility]/Anesthesiology Phone [phone] 12/08/2012 2:20 PM

## 2012-12-09 ENCOUNTER — Encounter (HOSPITAL_COMMUNITY): Admission: RE | Payer: Self-pay | Source: Ambulatory Visit

## 2012-12-09 ENCOUNTER — Inpatient Hospital Stay (HOSPITAL_COMMUNITY): Admission: RE | Admit: 2012-12-09 | Payer: Medicare Other | Source: Ambulatory Visit | Admitting: Neurosurgery

## 2012-12-09 SURGERY — LUMBAR LAMINECTOMY/DECOMPRESSION MICRODISCECTOMY 1 LEVEL
Anesthesia: General | Site: Back | Laterality: Right

## 2012-12-11 ENCOUNTER — Encounter (HOSPITAL_COMMUNITY): Payer: Self-pay | Admitting: Pharmacy Technician

## 2012-12-14 MED ORDER — CEFAZOLIN SODIUM-DEXTROSE 2-3 GM-% IV SOLR: 2.0000 g | INTRAVENOUS | Status: DC

## 2012-12-14 MED ORDER — DEXAMETHASONE SODIUM PHOSPHATE 10 MG/ML IJ SOLN
10.0000 mg | INTRAMUSCULAR | Status: AC
  Administered 2012-12-15: 10 mg via INTRAVENOUS

## 2012-12-15 ENCOUNTER — Ambulatory Visit: Payer: Medicare Other | Admitting: Pulmonary Disease

## 2012-12-15 ENCOUNTER — Inpatient Hospital Stay (HOSPITAL_COMMUNITY): Payer: Medicare Other

## 2012-12-15 ENCOUNTER — Encounter (HOSPITAL_COMMUNITY)
Admission: RE | Disposition: A | Discharge status: Home / Self Care | Payer: Self-pay | Source: Ambulatory Visit | Attending: Neurosurgery

## 2012-12-15 ENCOUNTER — Encounter (HOSPITAL_COMMUNITY): Payer: Self-pay | Admitting: Surgery

## 2012-12-15 ENCOUNTER — Inpatient Hospital Stay (HOSPITAL_COMMUNITY)
Admission: RE | Admit: 2012-12-15 | Discharge: 2012-12-16 | DRG: 491 | Disposition: A | Discharge status: Home / Self Care | Payer: Medicare Other | Source: Ambulatory Visit | Attending: Neurosurgery | Admitting: Neurosurgery

## 2012-12-15 ENCOUNTER — Inpatient Hospital Stay (HOSPITAL_COMMUNITY): Payer: Medicare Other | Admitting: Anesthesiology

## 2012-12-15 ENCOUNTER — Encounter (HOSPITAL_COMMUNITY): Payer: Self-pay | Admitting: Anesthesiology

## 2012-12-15 DIAGNOSIS — F329 Major depressive disorder, single episode, unspecified: Secondary | ICD-10-CM | POA: Diagnosis present

## 2012-12-15 DIAGNOSIS — K219 Gastro-esophageal reflux disease without esophagitis: Secondary | ICD-10-CM | POA: Diagnosis present

## 2012-12-15 DIAGNOSIS — I10 Essential (primary) hypertension: Secondary | ICD-10-CM | POA: Diagnosis present

## 2012-12-15 DIAGNOSIS — J4489 Other specified chronic obstructive pulmonary disease: Secondary | ICD-10-CM | POA: Diagnosis present

## 2012-12-15 DIAGNOSIS — F4310 Post-traumatic stress disorder, unspecified: Secondary | ICD-10-CM | POA: Diagnosis present

## 2012-12-15 DIAGNOSIS — G43909 Migraine, unspecified, not intractable, without status migrainosus: Secondary | ICD-10-CM | POA: Diagnosis present

## 2012-12-15 DIAGNOSIS — Z79899 Other long term (current) drug therapy: Secondary | ICD-10-CM

## 2012-12-15 DIAGNOSIS — J310 Chronic rhinitis: Secondary | ICD-10-CM | POA: Diagnosis present

## 2012-12-15 DIAGNOSIS — G4733 Obstructive sleep apnea (adult) (pediatric): Secondary | ICD-10-CM | POA: Diagnosis present

## 2012-12-15 DIAGNOSIS — F3289 Other specified depressive episodes: Secondary | ICD-10-CM | POA: Diagnosis present

## 2012-12-15 DIAGNOSIS — M5126 Other intervertebral disc displacement, lumbar region: Principal | ICD-10-CM | POA: Diagnosis present

## 2012-12-15 DIAGNOSIS — J449 Chronic obstructive pulmonary disease, unspecified: Secondary | ICD-10-CM | POA: Diagnosis present

## 2012-12-15 DIAGNOSIS — E785 Hyperlipidemia, unspecified: Secondary | ICD-10-CM | POA: Diagnosis present

## 2012-12-15 DIAGNOSIS — Z87891 Personal history of nicotine dependence: Secondary | ICD-10-CM

## 2012-12-15 HISTORY — PX: LUMBAR LAMINECTOMY/DECOMPRESSION MICRODISCECTOMY: SHX5026

## 2012-12-15 SURGERY — LUMBAR LAMINECTOMY/DECOMPRESSION MICRODISCECTOMY 1 LEVEL
Anesthesia: General | Site: Back | Laterality: Right | Wound class: Clean

## 2012-12-15 MED ORDER — NEOSTIGMINE METHYLSULFATE 1 MG/ML IJ SOLN
INTRAMUSCULAR | Status: DC | PRN
  Administered 2012-12-15: 4 mg via INTRAVENOUS

## 2012-12-15 MED ORDER — ACETAMINOPHEN 325 MG PO TABS: 650.0000 mg | ORAL_TABLET | ORAL | Status: DC | PRN

## 2012-12-15 MED ORDER — OXYCODONE HCL 5 MG/5ML PO SOLN: 5.0000 mg | Freq: Once | ORAL | Status: DC | PRN

## 2012-12-15 MED ORDER — HYDROMORPHONE HCL PF 1 MG/ML IJ SOLN
0.2500 mg | INTRAMUSCULAR | Status: DC | PRN
  Administered 2012-12-15: 0.5 mg via INTRAVENOUS

## 2012-12-15 MED ORDER — FLUOXETINE HCL 20 MG PO CAPS
40.0000 mg | ORAL_CAPSULE | Freq: Every day | ORAL | Status: DC
  Administered 2012-12-16: 40 mg via ORAL
  Filled 2012-12-15: qty 2

## 2012-12-15 MED ORDER — VITAMIN D 50 MCG (2000 UT) PO CAPS
2000.0000 [IU] | ORAL_CAPSULE | Freq: Every day | ORAL | Status: DC
Start: 2012-12-15 — End: 2012-12-15

## 2012-12-15 MED ORDER — HYDROCHLOROTHIAZIDE 12.5 MG PO CAPS
12.5000 mg | ORAL_CAPSULE | Freq: Every day | ORAL | Status: DC
  Administered 2012-12-16: 12.5 mg via ORAL
  Filled 2012-12-15 (×2): qty 1

## 2012-12-15 MED ORDER — MENTHOL 3 MG MT LOZG: 1.0000 | LOZENGE | OROMUCOSAL | Status: DC | PRN

## 2012-12-15 MED ORDER — BUDESONIDE-FORMOTEROL FUMARATE 160-4.5 MCG/ACT IN AERO
2.0000 | INHALATION_SPRAY | Freq: Two times a day (BID) | RESPIRATORY_TRACT | Status: DC
  Administered 2012-12-16: 2 via RESPIRATORY_TRACT
  Filled 2012-12-15: qty 6

## 2012-12-15 MED ORDER — SODIUM CHLORIDE 0.9 % IV SOLN
250.0000 mL | INTRAVENOUS | Status: DC
  Administered 2012-12-15: 250 mL via INTRAVENOUS

## 2012-12-15 MED ORDER — THROMBIN 5000 UNITS EX SOLR
CUTANEOUS | Status: DC | PRN
  Administered 2012-12-15 (×2): 5000 [IU] via TOPICAL

## 2012-12-15 MED ORDER — SODIUM CHLORIDE 0.9 % IR SOLN
Status: DC | PRN
  Administered 2012-12-15: 16:00:00

## 2012-12-15 MED ORDER — CEFAZOLIN SODIUM 1-5 GM-% IV SOLN
1.0000 g | Freq: Three times a day (TID) | INTRAVENOUS | Status: AC
  Administered 2012-12-15 – 2012-12-16 (×2): 1 g via INTRAVENOUS
  Filled 2012-12-15 (×2): qty 50

## 2012-12-15 MED ORDER — ACETAMINOPHEN 650 MG RE SUPP: 650.0000 mg | RECTAL | Status: DC | PRN

## 2012-12-15 MED ORDER — LACTATED RINGERS IV SOLN
INTRAVENOUS | Status: DC | PRN
  Administered 2012-12-15 (×2): via INTRAVENOUS

## 2012-12-15 MED ORDER — BACITRACIN 50000 UNITS IM SOLR
INTRAMUSCULAR | Status: AC
  Filled 2012-12-15: qty 1

## 2012-12-15 MED ORDER — LOSARTAN POTASSIUM 50 MG PO TABS
50.0000 mg | ORAL_TABLET | Freq: Every day | ORAL | Status: DC
  Administered 2012-12-16: 50 mg via ORAL
  Filled 2012-12-15 (×2): qty 1

## 2012-12-15 MED ORDER — GLYCOPYRROLATE 0.2 MG/ML IJ SOLN
INTRAMUSCULAR | Status: DC | PRN
  Administered 2012-12-15: .8 mg via INTRAVENOUS

## 2012-12-15 MED ORDER — DOCUSATE SODIUM 100 MG PO CAPS
100.0000 mg | ORAL_CAPSULE | Freq: Two times a day (BID) | ORAL | Status: DC
  Administered 2012-12-15 – 2012-12-16 (×2): 100 mg via ORAL
  Filled 2012-12-15 (×2): qty 1

## 2012-12-15 MED ORDER — SODIUM CHLORIDE 0.9 % IJ SOLN: 3.0000 mL | Freq: Two times a day (BID) | INTRAMUSCULAR | Status: DC

## 2012-12-15 MED ORDER — FENTANYL CITRATE 0.05 MG/ML IJ SOLN
INTRAMUSCULAR | Status: DC | PRN
  Administered 2012-12-15 (×2): 50 ug via INTRAVENOUS
  Administered 2012-12-15: 100 ug via INTRAVENOUS
  Administered 2012-12-15: 150 ug via INTRAVENOUS

## 2012-12-15 MED ORDER — ONDANSETRON HCL 4 MG/2ML IJ SOLN
INTRAMUSCULAR | Status: DC | PRN
  Administered 2012-12-15: 4 mg via INTRAVENOUS

## 2012-12-15 MED ORDER — TIOTROPIUM BROMIDE MONOHYDRATE 18 MCG IN CAPS
18.0000 ug | ORAL_CAPSULE | Freq: Every day | RESPIRATORY_TRACT | Status: DC
  Administered 2012-12-16: 18 ug via RESPIRATORY_TRACT
  Filled 2012-12-15: qty 5

## 2012-12-15 MED ORDER — HYDROMORPHONE HCL PF 1 MG/ML IJ SOLN: 0.5000 mg | INTRAMUSCULAR | Status: DC | PRN

## 2012-12-15 MED ORDER — PHENOL 1.4 % MT LIQD: 1.0000 | OROMUCOSAL | Status: DC | PRN

## 2012-12-15 MED ORDER — HYDROMORPHONE HCL PF 1 MG/ML IJ SOLN
INTRAMUSCULAR | Status: AC
  Filled 2012-12-15: qty 1

## 2012-12-15 MED ORDER — EZETIMIBE-SIMVASTATIN 10-40 MG PO TABS
1.0000 | ORAL_TABLET | Freq: Every day | ORAL | Status: DC
  Filled 2012-12-15 (×2): qty 1

## 2012-12-15 MED ORDER — PROPOFOL 10 MG/ML IV BOLUS
INTRAVENOUS | Status: DC | PRN
  Administered 2012-12-15: 170 mg via INTRAVENOUS

## 2012-12-15 MED ORDER — OXYCODONE HCL 5 MG PO TABS: 5.0000 mg | ORAL_TABLET | Freq: Once | ORAL | Status: DC | PRN

## 2012-12-15 MED ORDER — ARTIFICIAL TEARS OP OINT
TOPICAL_OINTMENT | OPHTHALMIC | Status: DC | PRN
  Administered 2012-12-15: 1 via OPHTHALMIC

## 2012-12-15 MED ORDER — SODIUM CHLORIDE 0.9 % IJ SOLN: 3.0000 mL | INTRAMUSCULAR | Status: DC | PRN

## 2012-12-15 MED ORDER — METOPROLOL TARTRATE 25 MG PO TABS
25.0000 mg | ORAL_TABLET | Freq: Every day | ORAL | Status: DC
  Administered 2012-12-16: 25 mg via ORAL
  Filled 2012-12-15: qty 1

## 2012-12-15 MED ORDER — BUPIVACAINE HCL (PF) 0.25 % IJ SOLN
INTRAMUSCULAR | Status: DC | PRN
  Administered 2012-12-15: 10 mL

## 2012-12-15 MED ORDER — PANTOPRAZOLE SODIUM 40 MG IV SOLR
40.0000 mg | Freq: Every day | INTRAVENOUS | Status: DC
  Administered 2012-12-15: 40 mg via INTRAVENOUS
  Filled 2012-12-15 (×2): qty 40

## 2012-12-15 MED ORDER — CYCLOBENZAPRINE HCL 10 MG PO TABS: 10.0000 mg | ORAL_TABLET | Freq: Three times a day (TID) | ORAL | Status: DC | PRN

## 2012-12-15 MED ORDER — LOSARTAN POTASSIUM-HCTZ 50-12.5 MG PO TABS: 1.0000 | ORAL_TABLET | Freq: Every day | ORAL | Status: DC

## 2012-12-15 MED ORDER — LIDOCAINE-EPINEPHRINE 1 %-1:100000 IJ SOLN
INTRAMUSCULAR | Status: DC | PRN
  Administered 2012-12-15: 10 mL

## 2012-12-15 MED ORDER — HEMOSTATIC AGENTS (NO CHARGE) OPTIME
TOPICAL | Status: DC | PRN
  Administered 2012-12-15: 1 via TOPICAL

## 2012-12-15 MED ORDER — DEXAMETHASONE SODIUM PHOSPHATE 10 MG/ML IJ SOLN
INTRAMUSCULAR | Status: AC
  Filled 2012-12-15: qty 1

## 2012-12-15 MED ORDER — LIDOCAINE HCL (CARDIAC) 20 MG/ML IV SOLN
INTRAVENOUS | Status: DC | PRN
  Administered 2012-12-15: 75 mg via INTRAVENOUS

## 2012-12-15 MED ORDER — ONDANSETRON HCL 4 MG/2ML IJ SOLN: 4.0000 mg | Freq: Four times a day (QID) | INTRAMUSCULAR | Status: DC | PRN

## 2012-12-15 MED ORDER — CEFAZOLIN SODIUM-DEXTROSE 2-3 GM-% IV SOLR
INTRAVENOUS | Status: AC
  Administered 2012-12-15: 2 g via INTRAVENOUS
  Filled 2012-12-15: qty 50

## 2012-12-15 MED ORDER — FLUOXETINE HCL 40 MG PO CAPS: 40.0000 mg | ORAL_CAPSULE | Freq: Every day | ORAL | Status: DC

## 2012-12-15 MED ORDER — SODIUM CHLORIDE 0.9 % IV SOLN
INTRAVENOUS | Status: AC
  Filled 2012-12-15: qty 500

## 2012-12-15 MED ORDER — VITAMIN D3 25 MCG (1000 UNIT) PO TABS
2000.0000 [IU] | ORAL_TABLET | Freq: Every day | ORAL | Status: DC
  Administered 2012-12-16: 2000 [IU] via ORAL
  Filled 2012-12-15: qty 2

## 2012-12-15 MED ORDER — HYDROCODONE-ACETAMINOPHEN 5-325 MG PO TABS
2.0000 | ORAL_TABLET | Freq: Four times a day (QID) | ORAL | Status: DC | PRN
  Administered 2012-12-15 – 2012-12-16 (×3): 1 via ORAL
  Filled 2012-12-15: qty 2
  Filled 2012-12-15 (×2): qty 1

## 2012-12-15 MED ORDER — 0.9 % SODIUM CHLORIDE (POUR BTL) OPTIME
TOPICAL | Status: DC | PRN
  Administered 2012-12-15: 1000 mL

## 2012-12-15 MED ORDER — ONDANSETRON HCL 4 MG/2ML IJ SOLN: 4.0000 mg | INTRAMUSCULAR | Status: DC | PRN

## 2012-12-15 MED ORDER — ALBUTEROL SULFATE HFA 108 (90 BASE) MCG/ACT IN AERS: 2.0000 | INHALATION_SPRAY | Freq: Four times a day (QID) | RESPIRATORY_TRACT | Status: DC | PRN

## 2012-12-15 MED ORDER — ALUM & MAG HYDROXIDE-SIMETH 200-200-20 MG/5ML PO SUSP: 30.0000 mL | Freq: Four times a day (QID) | ORAL | Status: DC | PRN

## 2012-12-15 MED ORDER — ROCURONIUM BROMIDE 100 MG/10ML IV SOLN
INTRAVENOUS | Status: DC | PRN
  Administered 2012-12-15: 50 mg via INTRAVENOUS

## 2012-12-15 SURGICAL SUPPLY — 63 items
ADH SKN CLS APL DERMABOND .7 (GAUZE/BANDAGES/DRESSINGS)
ADH SKN CLS LQ APL DERMABOND (GAUZE/BANDAGES/DRESSINGS) ×1
APL SKNCLS STERI-STRIP NONHPOA (GAUZE/BANDAGES/DRESSINGS) ×1
BAG DECANTER FOR FLEXI CONT (MISCELLANEOUS) ×2 IMPLANT
BENZOIN TINCTURE PRP APPL 2/3 (GAUZE/BANDAGES/DRESSINGS) ×2 IMPLANT
BLADE SURG 11 STRL SS (BLADE) ×2 IMPLANT
BLADE SURG ROTATE 9660 (MISCELLANEOUS) ×1 IMPLANT
BRUSH SCRUB EZ PLAIN DRY (MISCELLANEOUS) ×2 IMPLANT
BUR MATCHSTICK NEURO 3.0 LAGG (BURR) ×2 IMPLANT
BUR PRECISION FLUTE 6.0 (BURR) ×2 IMPLANT
CANISTER SUCTION 2500CC (MISCELLANEOUS) ×2 IMPLANT
CLOTH BEACON ORANGE TIMEOUT ST (SAFETY) ×2 IMPLANT
CONT SPEC 4OZ CLIKSEAL STRL BL (MISCELLANEOUS) ×2 IMPLANT
DECANTER SPIKE VIAL GLASS SM (MISCELLANEOUS) ×2 IMPLANT
DERMABOND ADHESIVE PROPEN (GAUZE/BANDAGES/DRESSINGS) ×1
DERMABOND ADVANCED (GAUZE/BANDAGES/DRESSINGS)
DERMABOND ADVANCED .7 DNX12 (GAUZE/BANDAGES/DRESSINGS) ×1 IMPLANT
DERMABOND ADVANCED .7 DNX6 (GAUZE/BANDAGES/DRESSINGS) IMPLANT
DRAPE LAPAROTOMY 100X72X124 (DRAPES) ×2 IMPLANT
DRAPE MICROSCOPE LEICA (MISCELLANEOUS) ×2 IMPLANT
DRAPE MICROSCOPE ZEISS OPMI (DRAPES) ×1 IMPLANT
DRAPE POUCH INSTRU U-SHP 10X18 (DRAPES) ×2 IMPLANT
DRAPE PROXIMA HALF (DRAPES) IMPLANT
DRAPE SURG 17X23 STRL (DRAPES) ×2 IMPLANT
DRSG OPSITE 4X5.5 SM (GAUZE/BANDAGES/DRESSINGS) ×2 IMPLANT
DURAPREP 26ML APPLICATOR (WOUND CARE) ×2 IMPLANT
ELECT BLADE 4.0 EZ CLEAN MEGAD (MISCELLANEOUS) ×2
ELECT REM PT RETURN 9FT ADLT (ELECTROSURGICAL) ×2
ELECTRODE BLDE 4.0 EZ CLN MEGD (MISCELLANEOUS) IMPLANT
ELECTRODE REM PT RTRN 9FT ADLT (ELECTROSURGICAL) ×1 IMPLANT
GAUZE SPONGE 4X4 16PLY XRAY LF (GAUZE/BANDAGES/DRESSINGS) IMPLANT
GLOVE BIO SURGEON STRL SZ8 (GLOVE) ×2 IMPLANT
GLOVE BIOGEL PI IND STRL 7.0 (GLOVE) IMPLANT
GLOVE BIOGEL PI INDICATOR 7.0 (GLOVE) ×1
GLOVE ECLIPSE 7.5 STRL STRAW (GLOVE) ×1 IMPLANT
GLOVE EXAM NITRILE LRG STRL (GLOVE) IMPLANT
GLOVE EXAM NITRILE MD LF STRL (GLOVE) ×2 IMPLANT
GLOVE EXAM NITRILE XL STR (GLOVE) IMPLANT
GLOVE EXAM NITRILE XS STR PU (GLOVE) IMPLANT
GLOVE INDICATOR 8.5 STRL (GLOVE) ×2 IMPLANT
GLOVE SURG SS PI 7.0 STRL IVOR (GLOVE) ×2 IMPLANT
GOWN BRE IMP SLV AUR LG STRL (GOWN DISPOSABLE) ×1 IMPLANT
GOWN BRE IMP SLV AUR XL STRL (GOWN DISPOSABLE) ×5 IMPLANT
GOWN STRL REIN 2XL LVL4 (GOWN DISPOSABLE) IMPLANT
KIT BASIN OR (CUSTOM PROCEDURE TRAY) ×2 IMPLANT
KIT ROOM TURNOVER OR (KITS) ×2 IMPLANT
NDL SPNL 22GX3.5 QUINCKE BK (NEEDLE) ×1 IMPLANT
NEEDLE HYPO 22GX1.5 SAFETY (NEEDLE) ×2 IMPLANT
NEEDLE SPNL 22GX3.5 QUINCKE BK (NEEDLE) ×2 IMPLANT
NS IRRIG 1000ML POUR BTL (IV SOLUTION) ×2 IMPLANT
PACK LAMINECTOMY NEURO (CUSTOM PROCEDURE TRAY) ×2 IMPLANT
RUBBERBAND STERILE (MISCELLANEOUS) ×4 IMPLANT
SPONGE GAUZE 4X4 12PLY (GAUZE/BANDAGES/DRESSINGS) ×2 IMPLANT
SPONGE SURGIFOAM ABS GEL SZ50 (HEMOSTASIS) ×2 IMPLANT
STRIP CLOSURE SKIN 1/2X4 (GAUZE/BANDAGES/DRESSINGS) ×2 IMPLANT
SUT VIC AB 0 CT1 18XCR BRD8 (SUTURE) ×1 IMPLANT
SUT VIC AB 0 CT1 8-18 (SUTURE) ×2
SUT VIC AB 2-0 CT1 18 (SUTURE) ×2 IMPLANT
SUT VICRYL 4-0 PS2 18IN ABS (SUTURE) ×2 IMPLANT
SYR 20ML ECCENTRIC (SYRINGE) ×2 IMPLANT
TOWEL OR 17X24 6PK STRL BLUE (TOWEL DISPOSABLE) ×2 IMPLANT
TOWEL OR 17X26 10 PK STRL BLUE (TOWEL DISPOSABLE) ×2 IMPLANT
WATER STERILE IRR 1000ML POUR (IV SOLUTION) ×2 IMPLANT

## 2012-12-15 NOTE — Transfer of Care (Signed)
Immediate Anesthesia Transfer of Care Note  Patient: Travis Ferguson  Procedure(s) Performed: Procedure(s) with comments: LUMBAR LAMINECTOMY/DECOMPRESSION MICRODISCECTOMY 1 LEVEL (Right) - Right Lumbar four-five laminectomy/foraminotomy  Patient Location: PACU  Anesthesia Type:General  Level of Consciousness: awake, alert  and oriented  Airway & Oxygen Therapy: Patient Spontanous Breathing and Patient connected to nasal cannula oxygen  Post-op Assessment: Report given to PACU RN and Post -op Vital signs reviewed and stable  Post vital signs: Reviewed and stable  Complications: No apparent anesthesia complications

## 2012-12-15 NOTE — Progress Notes (Signed)
During pre-op assessment patient requested to have a ice pack on his thigh. Patient stated "I have chronic pain in my right thigh and I usually put a ice pack on it to help with the pain. Nurse called Dr. Lonie Peak office and spoke with Erie Noe to inquire if patient could have a ice pack applied to his right thigh. Erie Noe placed Nurse on hold and then informed Nurse that Dr. Wynetta Emery stated that patient could have ice pack on right thigh. Will inform patient of this.

## 2012-12-15 NOTE — Anesthesia Postprocedure Evaluation (Signed)
Anesthesia Post Note  Patient: Travis Ferguson  Procedure(s) Performed: Procedure(s) (LRB): LUMBAR LAMINECTOMY/DECOMPRESSION MICRODISCECTOMY 1 LEVEL (Right)  Anesthesia type: General  Patient location: PACU  Post pain: Pain level controlled and Adequate analgesia  Post assessment: Post-op Vital signs reviewed, Patient's Cardiovascular Status Stable, Respiratory Function Stable, Patent Airway and Pain level controlled  Last Vitals:  Filed Vitals:   12/15/12 1815  BP: 118/72  Pulse: 88  Temp:   Resp: 18    Post vital signs: Reviewed and stable  Level of consciousness: awake, alert  and oriented  Complications: No apparent anesthesia complications

## 2012-12-15 NOTE — Op Note (Signed)
Preoperative diagnosis: Right L4 radiculopathy from ruptured disc L4-5 right  Postoperative diagnosis: Same  Procedure lumbar laminectomy and microdiscectomy L4-5 right L4 nerve root and microdissection of the right L4 nerve root microscopic discectomy   Surgeon: Daryl Eastern  Assistant: Sharyon Cable  Anesthesia: General  EBL: Minimal  History of present illness: This very was very resume present with back and right leg pain rated the outside of his right thigh around his thoracentesis with L4 nerve are pattern workup should occur large disc herniation displacing the right L4 nerve root migrating cephalad to the L4-5 disc space due to stricture a true progression of clinical syndrome weakness in the leg are recommended the discectomy well there is no evidence of a person with him as well as perioperative course expectations about alternatives of surgery he understood and agreed to proceed forward.  Operative procedure: Patient was brought into your was induced under general anesthesia positioned prone the Wilson frame his back was prepped and draped in routine sterile fashion preoperative pressure localize the appropriate level so after infiltration 10 cc loculated epi a midline incision was made and Bovie electrocautery was used to take down the subcutaneous tissue and subperiosteal dissection was carefully of L4 and L5 on the right. Interoperative x-ray confirmed appropriate level. Intrahospital and L4 medial facet complex aggressive I was until about a high-speed drill laminotomy was begun with a 3 mm Kerrison punch ligament was identified and removed piecemeal fashion exposing the thecal sac then under microscopic illumination the undersurface the medial gutter was further drilled down the L5 pedicle was identified the L5 nerve was identified/the pedicle and then working superiorly a very large multiple fragments of large disc were immediately identified migrate superiorly from the disc space up  towards the inferior aspect of the L4 nerve. After several fragments were removed the disc space was incised cleaned out today rongeurs the thecal sac both medially caudally and cephalad were all explored with a coronary dilator confirm patency both foramina were widely patent at this point it was copiously irrigated meticulous he states was maintained Gelfoam was laid over the dura and the muscle fascia reapproximated with interrupted Vicryls. The case all new counts and sponge counts were correct.

## 2012-12-15 NOTE — Anesthesia Procedure Notes (Signed)
Procedure Name: Intubation Date/Time: 12/15/2012 3:22 PM Performed by: Gwenyth Allegra Pre-anesthesia Checklist: Emergency Drugs available, Patient identified, Timeout performed, Suction available and Patient being monitored Patient Re-evaluated:Patient Re-evaluated prior to inductionOxygen Delivery Method: Circle system utilized Preoxygenation: Pre-oxygenation with 100% oxygen Intubation Type: IV induction Ventilation: Mask ventilation without difficulty Laryngoscope Size: Mac and 4 Grade View: Grade II Tube size: 7.5 mm Number of attempts: 1 Airway Equipment and Method: Stylet Placement Confirmation: ETT inserted through vocal cords under direct vision,  breath sounds checked- equal and bilateral and positive ETCO2 Secured at: 22 cm Tube secured with: Tape Dental Injury: Teeth and Oropharynx as per pre-operative assessment

## 2012-12-15 NOTE — Preoperative (Signed)
Beta Blockers   Reason not to administer Beta Blockers:Not Applicable 

## 2012-12-15 NOTE — H&P (Signed)
Travis Ferguson is an 72 y.o. male.   Chief Complaint: Back and right leg pain HPI: Patient is a very pleasant 72 year old gentleman is a patient I treated him for a subdural hematoma the patient recovered from that very well but over last several weeks and months of progressive worsening back and right leg pain with cramping in his quads cramping in his calf with pain radiating to his shin of his right leg consistent with an L4 nerve root pattern imaging showed a very large disc herniation L4-5 with a cephalad migration of a large free fragment displacing the right L4 nerve root and certainly this correlate with his clinical syndrome. He has a little bit left leg pain but has not predominant form he denies any bowel bladder complaints denies any weakness he status in his foot. I have extensively reviewed the risks and benefits of an L4-5 laminectomy and discectomy on gone over perioperative course and expectations of outcome and alternatives of surgery he understood and agreed to proceed forward.  Past Medical History  Diagnosis Date  . COPD (chronic obstructive pulmonary disease)   . Depression   . PTSD (post-traumatic stress disorder)   . OSA (obstructive sleep apnea)   . Migraine   . GERD (gastroesophageal reflux disease)   . Dyslipidemia   . Rhinitis   . Cervical disc disease   . 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 Hypertension   . Shortness of breath     Past Surgical History  Procedure Laterality Date  . Craniotomy  11/10    right frontal  . Video bronchoscopy  07/28/2012    Procedure: VIDEO BRONCHOSCOPY WITH FLUORO;  Surgeon: Coralyn Helling, MD;  Location: [facility];  Service: Cardiopulmonary;  Laterality: Bilateral;  . Hernia repair Bilateral     right and left inguinal  . Knee arthroscopy Right     Family History  Problem Relation Age of Onset  . Coronary artery disease     Social History:  reports that he quit smoking about 4 years ago. His smoking use included Pipe. He has never  used smokeless tobacco. He reports that he does not drink alcohol or use illicit drugs.  Allergies: No Known Allergies  Medications Prior to Admission  Medication Sig Dispense Refill  . budesonide-formoterol (SYMBICORT) 160-4.5 MCG/ACT inhaler Inhale 2 puffs into the lungs 2 (two) times daily.  2 Inhaler  0  . Cholecalciferol (VITAMIN D) 2000 UNITS CAPS Take 2,000 Units by mouth daily.       [name] ezetimibe-simvastatin (VYTORIN) 10-40 MG per tablet Take 1 tablet by mouth at bedtime.        [name] FLUoxetine (PROZAC) 40 MG capsule Take 40 mg by mouth daily.        [name] HYDROcodone-acetaminophen (NORCO/VICODIN) 5-325 MG per tablet Take 1 tablet by mouth every 6 (six) hours as needed for pain.      [name] losartan-hydrochlorothiazide (HYZAAR) 50-12.5 MG per tablet Take 0.5 tablets by mouth daily.       . metoprolol tartrate (LOPRESSOR) 25 MG tablet Take 25 mg by mouth daily.       . Multiple Vitamin (MULTIVITAMIN) capsule Take 1 capsule by mouth daily.        . Omega-3 Fatty Acids (EQL OMEGA 3 FISH OIL) 1200 MG CPDR Take 1,200 mg by mouth daily.       . Probiotic Product (PROBIOTIC DAILY PO) Take 1 tablet by mouth daily.       [name] tiotropium (SPIRIVA) 18 MCG inhalation capsule Place  1 capsule (18 mcg total) into inhaler and inhale daily.  90 capsule  3  . acetaminophen (TYLENOL) 500 MG tablet Take 1,000 mg by mouth as needed for pain.       [name] albuterol (PROVENTIL HFA;VENTOLIN HFA) 108 (90 BASE) MCG/ACT inhaler Inhale 2 puffs into the lungs every 6 (six) hours as needed for wheezing.        No results found for this or any previous visit (from the past 48 hour(s)). No results found.  Review of Systems  Constitutional: Negative.   HENT: Negative.   Eyes: Negative.   Respiratory: Negative.   Cardiovascular: Negative.   Gastrointestinal: Negative.   Genitourinary: Negative.   Musculoskeletal: Positive for myalgias, back pain and joint pain.  Skin: Negative.   Neurological: Positive for tingling and tremors.   Endo/Heme/Allergies: Negative.     Blood pressure 130/87, pulse 88, temperature 98 F (36.7 C), temperature source Oral, resp. rate 20, height 6\' 5"  (1.956 m), weight 111 kg (244 lb 11.4 oz), SpO2 93.00%. Physical Exam  Constitutional: He is oriented to person, place, and time. He appears well-developed.  HENT:  Head: Normocephalic.  Eyes: Pupils are equal, round, and reactive to light.  Neck: Normal range of motion.  Cardiovascular: Normal rate.   Respiratory: Effort normal.  GI: Soft.  Musculoskeletal: Normal range of motion.  Neurological: He is alert and oriented to person, place, and time. He has normal strength. GCS eye subscore is 4. GCS verbal subscore is 5. GCS motor subscore is 6.  Reflex Scores:      Patellar reflexes are 0 on the right side and 0 on the left side.      Achilles reflexes are 0 on the right side and 0 on the left side.    Assessment/Plan  72 years and presents for right-sided L4-5 laminectomy and discectomy  Travis Ferguson P 12/15/2012, 3:02 PM

## 2012-12-15 NOTE — Anesthesia Preprocedure Evaluation (Signed)
Anesthesia Evaluation  Patient identified by MRN, date of birth, ID band Patient awake    Reviewed: Allergy & Precautions, H&P , NPO status , Patient's Chart, lab work & pertinent test results  Airway Mallampati: II  Neck ROM: full    Dental   Pulmonary shortness of breath, sleep apnea , COPDformer smoker,          Cardiovascular hypertension,     Neuro/Psych  Headaches, Depression    GI/Hepatic GERD-  ,  Endo/Other    Renal/GU      Musculoskeletal   Abdominal   Peds  Hematology   Anesthesia Other Findings   Reproductive/Obstetrics                           Anesthesia Physical Anesthesia Plan  ASA: III  Anesthesia Plan: General   Post-op Pain Management:    Induction: Intravenous  Airway Management Planned: Oral ETT  Additional Equipment:   Intra-op Plan:   Post-operative Plan: Extubation in OR  Informed Consent: I have reviewed the patients History and Physical, chart, labs and discussed the procedure including the risks, benefits and alternatives for the proposed anesthesia with the patient or authorized representative who has indicated his/her understanding and acceptance.     Plan Discussed with: CRNA, Anesthesiologist and Surgeon  Anesthesia Plan Comments:         Anesthesia Quick Evaluation

## 2012-12-16 ENCOUNTER — Encounter (HOSPITAL_COMMUNITY): Payer: Self-pay | Admitting: Neurosurgery

## 2012-12-16 ENCOUNTER — Telehealth: Payer: Self-pay | Admitting: Pulmonary Disease

## 2012-12-16 MED ORDER — HYDROCODONE-ACETAMINOPHEN 5-325 MG PO TABS: 2.0000 | ORAL_TABLET | Freq: Four times a day (QID) | ORAL | Status: DC | PRN

## 2012-12-16 MED ORDER — CYCLOBENZAPRINE HCL 10 MG PO TABS: 10.0000 mg | ORAL_TABLET | Freq: Three times a day (TID) | ORAL | Status: DC | PRN

## 2012-12-16 NOTE — Discharge Summary (Signed)
  Physician Discharge Summary  Patient ID: Travis Ferguson MRN: [id number] DOB/AGE: [birth year] 72 y.o.  Admit date: 12/15/2012 Discharge date: 12/16/2012  Admission Diagnoses: Right L4 radiculopathy from herniated nucleus pulposus L4 right  Discharge Diagnoses: Same Active Problems:   * No active hospital problems. *   Discharged Condition: good  Hospital Course: Patient admitted the hospital underwent a right L4-5 laminectomy microdiscectomy postoperatively patient did very well recovered in the floor on the floor he was ambulating and voiding tolerating a regular diet with significant improvement preoperative radicular symptoms. He'll be discharged on Norco and cyclobenzaprine for pain.  Consults: Significant Diagnostic Studies: Treatments: Right L4-5 laminectomy might discectomy Discharge Exam: Blood pressure 115/81, pulse 117, temperature 98.1 F (36.7 C), temperature source Oral, resp. rate 20, height 6' (1.829 m), weight 113.036 kg (249 lb 3.2 oz), SpO2 92.00%. Thank out of 5 wound clean and dry  Disposition: Home   Future Appointments Provider Department Dept Phone   01/08/2013 4:15 PM Coralyn Helling, MD  [facility] [phone]       Medication List    TAKE these medications       acetaminophen 500 MG tablet  Commonly known as:  TYLENOL  Take 1,000 mg by mouth as needed for pain.     albuterol 108 (90 BASE) MCG/ACT inhaler  Commonly known as:  PROVENTIL HFA;VENTOLIN HFA  Inhale 2 puffs into the lungs every 6 (six) hours as needed for wheezing.     budesonide-formoterol 160-4.5 MCG/ACT inhaler  Commonly known as:  SYMBICORT  Inhale 2 puffs into the lungs 2 (two) times daily.     cyclobenzaprine 10 MG tablet  Commonly known as:  FLEXERIL  Take 1 tablet (10 mg total) by mouth 3 (three) times daily as needed for muscle spasms.     EQL OMEGA 3 FISH OIL 1200 MG Cpdr  Take 1,200 mg by mouth daily.     ezetimibe-simvastatin 10-40 MG per tablet  Commonly  known as:  VYTORIN  Take 1 tablet by mouth at bedtime.     FLUoxetine 40 MG capsule  Commonly known as:  PROZAC  Take 40 mg by mouth daily.     HYDROcodone-acetaminophen 5-325 MG per tablet  Commonly known as:  NORCO/VICODIN  Take 2 tablets by mouth every 6 (six) hours as needed.     HYDROcodone-acetaminophen 5-325 MG per tablet  Commonly known as:  NORCO/VICODIN  Take 1 tablet by mouth every 6 (six) hours as needed for pain.     losartan-hydrochlorothiazide 50-12.5 MG per tablet  Commonly known as:  HYZAAR  Take 0.5 tablets by mouth daily.     metoprolol tartrate 25 MG tablet  Commonly known as:  LOPRESSOR  Take 25 mg by mouth daily.     multivitamin capsule  Take 1 capsule by mouth daily.     PROBIOTIC DAILY PO  Take 1 tablet by mouth daily.     tiotropium 18 MCG inhalation capsule  Commonly known as:  SPIRIVA  Place 1 capsule (18 mcg total) into inhaler and inhale daily.     Vitamin D 2000 UNITS Caps  Take 2,000 Units by mouth daily.           Follow-up Information   Follow up with Flower Franko P, MD.   Contact information:   [address] [place] [address] [phone]       Signed: Juanmiguel Defelice P 12/16/2012, 8:46 AM

## 2012-12-16 NOTE — Progress Notes (Signed)
Discharge instructions given. All questions were answered, pt verbalized understanding.

## 2012-12-16 NOTE — Progress Notes (Signed)
Patient ID: Travis Ferguson, male   DOB: 09-Nov-1940, 72 y.o.   MRN: [id number] Patient is doing great no leg pain very minimal back stiffness seems to be urinating will discharge him

## 2012-12-16 NOTE — Telephone Encounter (Signed)
Spoke with patient-states he remembers a conversation with VS about certain medications to avoid and felt that Flexeril was one of those medications; Pt had disc/back surgery yesterday and wanted to make sure that it was okay to use. Thanks.

## 2012-12-16 NOTE — Telephone Encounter (Signed)
Spoke with pt.  Advised okay to use flexeril for short term as he recovers from back surgery.

## 2012-12-17 NOTE — Care Management Note (Signed)
    Page 1 of 1   12/17/2012     2:58:22 PM   CARE MANAGEMENT NOTE 12/17/2012  Patient:  Travis Ferguson, Travis Ferguson   Account Number:  [id number]  Date Initiated:  12/17/2012  Documentation initiated by:  Elmer Bales  Subjective/Objective Assessment:   Pt was admitted for a lumbar laminectomy     Action/Plan:   Will follow for discharge needs pending pT/OT eval   Anticipated DC Date:  12/17/2012   Anticipated DC Plan:  HOME/SELF CARE         Choice offered to / List presented to:             Status of service:  Completed, signed off Medicare Important Message given?   (If response is "NO", the following Medicare IM given date fields will be blank) Date Medicare IM given:   Date Additional Medicare IM given:    Discharge Disposition:  HOME/SELF CARE  Per UR Regulation:  Reviewed for med. necessity/level of care/duration of stay  If discussed at Long Length of Stay Meetings, dates discussed:    Comments:  12/17/12  Elmer Bales RN, MSN, CM- Patient was discharged home 12/17/12.  No discharge needs identified.

## 2012-12-18 ENCOUNTER — Emergency Department (HOSPITAL_COMMUNITY)
Admission: EM | Admit: 2012-12-18 | Discharge: 2012-12-18 | Disposition: A | Discharge status: Home / Self Care | Payer: Medicare Other | Attending: Emergency Medicine | Admitting: Emergency Medicine

## 2012-12-18 ENCOUNTER — Encounter (HOSPITAL_COMMUNITY): Payer: Self-pay | Admitting: Family Medicine

## 2012-12-18 DIAGNOSIS — E785 Hyperlipidemia, unspecified: Secondary | ICD-10-CM | POA: Insufficient documentation

## 2012-12-18 DIAGNOSIS — F329 Major depressive disorder, single episode, unspecified: Secondary | ICD-10-CM | POA: Insufficient documentation

## 2012-12-18 DIAGNOSIS — J449 Chronic obstructive pulmonary disease, unspecified: Secondary | ICD-10-CM | POA: Insufficient documentation

## 2012-12-18 DIAGNOSIS — J4489 Other specified chronic obstructive pulmonary disease: Secondary | ICD-10-CM | POA: Insufficient documentation

## 2012-12-18 DIAGNOSIS — Z8679 Personal history of other diseases of the circulatory system: Secondary | ICD-10-CM | POA: Insufficient documentation

## 2012-12-18 DIAGNOSIS — Z87891 Personal history of nicotine dependence: Secondary | ICD-10-CM | POA: Insufficient documentation

## 2012-12-18 DIAGNOSIS — Z79899 Other long term (current) drug therapy: Secondary | ICD-10-CM | POA: Insufficient documentation

## 2012-12-18 DIAGNOSIS — R34 Anuria and oliguria: Secondary | ICD-10-CM | POA: Insufficient documentation

## 2012-12-18 DIAGNOSIS — F3289 Other specified depressive episodes: Secondary | ICD-10-CM | POA: Insufficient documentation

## 2012-12-18 DIAGNOSIS — R339 Retention of urine, unspecified: Secondary | ICD-10-CM

## 2012-12-18 DIAGNOSIS — Z8719 Personal history of other diseases of the digestive system: Secondary | ICD-10-CM | POA: Insufficient documentation

## 2012-12-18 DIAGNOSIS — F4310 Post-traumatic stress disorder, unspecified: Secondary | ICD-10-CM | POA: Insufficient documentation

## 2012-12-18 DIAGNOSIS — Z8709 Personal history of other diseases of the respiratory system: Secondary | ICD-10-CM | POA: Insufficient documentation

## 2012-12-18 DIAGNOSIS — I10 Essential (primary) hypertension: Secondary | ICD-10-CM | POA: Insufficient documentation

## 2012-12-18 DIAGNOSIS — Z8739 Personal history of other diseases of the musculoskeletal system and connective tissue: Secondary | ICD-10-CM | POA: Insufficient documentation

## 2012-12-18 DIAGNOSIS — G4733 Obstructive sleep apnea (adult) (pediatric): Secondary | ICD-10-CM | POA: Insufficient documentation

## 2012-12-18 NOTE — ED Notes (Signed)
Per pt sts since surgery this week he has been having urinary retention. Sent here by doctor for catheter. sts when he goes to the restroom only a few drops come out.

## 2012-12-18 NOTE — ED Notes (Signed)
Dr. Wynetta Emery called to inform charge RN that pt only needed a foley catheter and follow up with Urology. Charge RN called to have pt acuity changed from a 3 to a 4.

## 2012-12-18 NOTE — ED Provider Notes (Signed)
History    This chart was scribed for non-physician practitioner, Fayrene Helper PA-C working with Gwyneth Sprout, MD by Donne Anon, ED Scribe. This patient was seen in room TR10C/TR10C and the patient's care was started at 1920.  CSN: [id number] Arrival date & time 12/18/12  1624  None    Chief Complaint  Patient presents with  . Urinary Retention    The history is provided by the patient. No language interpreter was used.   HPI Comments: Travis Ferguson is a 72 y.o. male who presents to the Emergency Department complaining of urinary retention since he he had surgery 4 days ago for a herniated disc. He was discharged from the hospital 3 days ago. When he was in the hospital he had a foley, when he was discharged it was removed. He states his surgeon, Dr. Cheree Ditto, sent him to the ED today to have a catheter placed. He reports when he tries to urinate only a few drops come out.  He denies a hx of enlarged prostate or prostate cancer. 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  Dr. Cheree Ditto is his surgeon.   Past Medical History  Diagnosis Date  . COPD (chronic obstructive pulmonary disease)   . Depression   . PTSD (post-traumatic stress disorder)   . OSA (obstructive sleep apnea)   . Migraine   . GERD (gastroesophageal reflux disease)   . Dyslipidemia   . Rhinitis   . Cervical disc disease   . 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 Hypertension   . Shortness of breath    Past Surgical History  Procedure Laterality Date  . Craniotomy  11/10    right frontal  . Video bronchoscopy  07/28/2012    Procedure: VIDEO BRONCHOSCOPY WITH FLUORO;  Surgeon: Coralyn Helling, MD;  Location: [facility];  Service: Cardiopulmonary;  Laterality: Bilateral;  . Hernia repair Bilateral     right and left inguinal  . Knee arthroscopy Right   . Lumbar laminectomy/decompression microdiscectomy Right 12/15/2012    Procedure: LUMBAR LAMINECTOMY/DECOMPRESSION MICRODISCECTOMY 1 LEVEL;  Surgeon: Mariam Dollar,  MD;  Location: [facility];  Service: Neurosurgery;  Laterality: Right;  Right Lumbar four-five laminectomy/foraminotomy   Family History  Problem Relation Age of Onset  . Coronary artery disease     History  Substance Use Topics  . Smoking status: Former Smoker -- 25 years    Types: Pipe    Quit date: 06/24/2008  . Smokeless tobacco: Never Used  . Alcohol Use: No    Review of Systems  Genitourinary: Positive for decreased urine volume.  All other systems reviewed and are negative.    Allergies  Review of patient's allergies indicates no known allergies.  Home Medications   Current Outpatient Rx  Name  Route  Sig  Dispense  Refill  . acetaminophen (TYLENOL) 500 MG tablet   Oral   Take 1,000 mg by mouth as needed for pain.          [name] albuterol (PROVENTIL HFA;VENTOLIN HFA) 108 (90 BASE) MCG/ACT inhaler   Inhalation   Inhale 2 puffs into the lungs every 6 (six) hours as needed for wheezing.         . budesonide-formoterol (SYMBICORT) 160-4.5 MCG/ACT inhaler   Inhalation   Inhale 2 puffs into the lungs 2 (two) times daily.   2 Inhaler   0   . Cholecalciferol (VITAMIN D) 2000 UNITS CAPS   Oral   Take 2,000 Units by mouth daily.          [name]  cyclobenzaprine (FLEXERIL) 10 MG tablet   Oral   Take 1 tablet (10 mg total) by mouth 3 (three) times daily as needed for muscle spasms.   80 tablet   1   . ezetimibe-simvastatin (VYTORIN) 10-40 MG per tablet   Oral   Take 1 tablet by mouth at bedtime.           [name] FLUoxetine (PROZAC) 40 MG capsule   Oral   Take 40 mg by mouth daily.           [name] HYDROcodone-acetaminophen (NORCO/VICODIN) 5-325 MG per tablet   Oral   Take 2 tablets by mouth every 6 (six) hours as needed.   80 tablet   1   . losartan-hydrochlorothiazide (HYZAAR) 50-12.5 MG per tablet   Oral   Take 0.5 tablets by mouth daily.          . metoprolol tartrate (LOPRESSOR) 25 MG tablet   Oral   Take 25 mg by mouth daily.          . Multiple  Vitamin (MULTIVITAMIN) capsule   Oral   Take 1 capsule by mouth daily.           . Omega-3 Fatty Acids (EQL OMEGA 3 FISH OIL) 1200 MG CPDR   Oral   Take 1,200 mg by mouth daily.          . Probiotic Product (PROBIOTIC DAILY PO)   Oral   Take 1 tablet by mouth daily.          [name] tiotropium (SPIRIVA) 18 MCG inhalation capsule   Inhalation   Place 1 capsule (18 mcg total) into inhaler and inhale daily.   90 capsule   3    BP 120/76  Pulse 96  Temp(Src) 98.3 F (36.8 C)  Resp 18  SpO2 95%  Physical Exam  Nursing note and vitals reviewed. Constitutional: He appears well-developed and well-nourished. No distress.  HENT:  Head: Normocephalic and atraumatic.  Eyes: Conjunctivae are normal.  Neck: Neck supple. No tracheal deviation present.  Cardiovascular: Normal rate.   Pulmonary/Chest: Effort normal. No respiratory distress.  Genitourinary:  uncircumcised penis with foley in place. No rash. No tenderness.  Musculoskeletal: Normal range of motion.  Neurological: He is alert.  Skin: Skin is warm and dry.  Psychiatric: He has a normal mood and affect. His behavior is normal.    ED Course  Procedures (including critical care time) DIAGNOSTIC STUDIES: Oxygen Saturation is 95% on RA, adequate by my interpretation.    COORDINATION OF CARE: 7:22 PM Discussed treatment plan which includes placing a foley catheter with pt at bedside and pt agreed to plan. Advised pt to follow up with his physician.   Labs Reviewed - No data to display No results found. No diagnosis found.  MDM  BP 120/76  Pulse 96  Temp(Src) 98.3 F (36.8 C)  Resp 18  SpO2 95%   I personally performed the services described in this documentation, which was scribed in my presence. The recorded information has been reviewed and is accurate.     Fayrene Helper, PA-C 12/18/12 2021

## 2012-12-18 NOTE — ED Notes (Signed)
Leg bag applied to foley, pt given foley bag and urinal to take home.

## 2012-12-19 NOTE — ED Provider Notes (Signed)
Medical screening examination/treatment/procedure(s) were performed by non-physician practitioner and as supervising physician I was immediately available for consultation/collaboration.   Gwyneth Sprout, MD 12/19/12 [phone]

## 2012-12-29 ENCOUNTER — Other Ambulatory Visit (HOSPITAL_COMMUNITY): Payer: Medicare Other

## 2013-01-08 ENCOUNTER — Ambulatory Visit: Payer: Medicare Other | Admitting: Pulmonary Disease

## 2013-01-08 ENCOUNTER — Ambulatory Visit (INDEPENDENT_AMBULATORY_CARE_PROVIDER_SITE_OTHER): Payer: Medicare Other | Admitting: Pulmonary Disease

## 2013-01-08 ENCOUNTER — Encounter: Payer: Self-pay | Admitting: Pulmonary Disease

## 2013-01-08 VITALS — BP 128/70 | HR 98 | Temp 97.9°F | Ht 72.0 in | Wt 253.8 lb

## 2013-01-08 DIAGNOSIS — R918 Other nonspecific abnormal finding of lung field: Secondary | ICD-10-CM

## 2013-01-08 DIAGNOSIS — J449 Chronic obstructive pulmonary disease, unspecified: Secondary | ICD-10-CM

## 2013-01-08 DIAGNOSIS — R222 Localized swelling, mass and lump, trunk: Secondary | ICD-10-CM

## 2013-01-08 NOTE — Assessment & Plan Note (Signed)
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  His follow CT chest's have shown stable to improved findings with prior prednisone therapy >> this speaks against malignancy.  Will continue to monitor off prednisone.  Will re-assess in two months, and then determine when to repeat chest imaging studies, likely with repeat CT chest.

## 2013-01-08 NOTE — Patient Instructions (Signed)
Follow up in 2 months

## 2013-01-08 NOTE — Progress Notes (Signed)
Chief Complaint  Patient presents with  . Follow-up    pt has had spinal surgery on L 4-5.  had urinary retention--seen urology for this.  pt was told that the spiriva and advair can cause this.  wants VS input on this, and should he start on this new medication for his prostate.      History of Present Illness: Travis Ferguson is a 72 y.o. male former smoker with COPD, mild OSA, and sarcoidosis.  Since his last visit he had back surgery.  He had trouble with urinary retention after surgery, and is now being follow by urology.  He was advised to start medicine for his prostate, but is reluctant to do so.  He has been using spiriva and symbicort, and is able to pass urine still.  His breathing is doing as well as it has been for several months.  He is not having cough, wheeze, or sputum.    He is still limited with his activity due to his back pain.  He is taking pain medicine at night, and this helps his pain and sleep.  TESTS: PSG May 2008>> AHI 8  Spirometry 11/18/08>>FEV1 2.15 (61%), FEV1% 62  CT chest 05/19/12>>superior segment LLL infiltrate, b/l hilar and mediastinal LAN with calcification CT chest 07/13/12 >> atherosclerosis, Lt hilar adenopathy, LLL superior segment narrowing, mass like opacity superior segment LLL 5.4 x 1.9 cm, multiple nodular areas LLL, diffuse GGO LLL PET scan 07/22/12 >> mildly hypermetabolic mediastinal adenopathy (SUV 5.8), superior segment nodules hypermetabolic (SUV 12.1) Bronchoscopy 07/28/12 >> focal granulomatous inflammation 08/01/15 >>start prednisone trial; ACE 37 CT chest 08/31/12 >> decreased size of lesions CT chest 12/02/12 >> borderline mediastinal/hilar LAN, nodular septal thickening b/l, no change superior segment LLL, centrilobular emphysema  Travis Ferguson  has a past medical history of COPD (chronic obstructive pulmonary disease); Depression; PTSD (post-traumatic stress disorder); OSA (obstructive sleep apnea); Migraine; GERD (gastroesophageal  reflux disease); Dyslipidemia; Rhinitis; Cervical disc disease; Subdural hematoma (11/10); Hypertension; and Shortness of breath.  Travis Ferguson  has past surgical history that includes Craniotomy (11/10); Video bronchoscopy (07/28/2012); Hernia repair (Bilateral); Knee arthroscopy (Right); and Lumbar laminectomy/decompression microdiscectomy (Right, 12/15/2012).  Prior to Admission medications   Medication Sig Start Date End Date Taking? Authorizing Provider  acetaminophen (TYLENOL) 500 MG tablet Take 1,000 mg by mouth as needed.    Yes Historical Provider, MD  albuterol (PROVENTIL HFA;VENTOLIN HFA) 108 (90 BASE) MCG/ACT inhaler 1-2 puffs every 4-6 hours as needed 05/18/12  Yes Coralyn Helling, MD  budesonide-formoterol (SYMBICORT) 160-4.5 MCG/ACT inhaler Inhale 2 puffs into the lungs 2 (two) times daily. 05/11/12  Yes Tammy S Parrett, NP  ezetimibe-simvastatin (VYTORIN) 10-40 MG per tablet Take 1 tablet by mouth at bedtime.     Yes Historical Provider, MD  FLUoxetine (PROZAC) 40 MG capsule Take 40 mg by mouth daily.     Yes Historical Provider, MD  losartan-hydrochlorothiazide (HYZAAR) 50-12.5 MG per tablet 1/2 tab by mouth once daily 03/23/12  Yes Historical Provider, MD  metoprolol tartrate (LOPRESSOR) 25 MG tablet Take 1 tablet by mouth daily. 04/06/12  Yes Historical Provider, MD  Multiple Vitamin (MULTIVITAMIN) capsule Take 1 capsule by mouth daily.     Yes Historical Provider, MD  Omega-3 Fatty Acids (EQL OMEGA 3 FISH OIL) 1200 MG CPDR Take 1 capsule by mouth daily.    Yes Historical Provider, MD  Probiotic Product (PROBIOTIC DAILY PO) Take by mouth.   Yes Historical Provider, MD  tiotropium (SPIRIVA) 18 MCG inhalation  capsule Place 1 capsule (18 mcg total) into inhaler and inhale daily. 01/28/12  Yes Coralyn Helling, MD    No Known Allergies   Physical Exam:  General - No distress ENT - no sinus tenderness, no oral exudate, no LAN Cardiac - s1s2 regular, no murmur Chest - prolonged exhalation,  no wheeze/rales Back - No focal tenderness Abd - Soft, non-tender Ext - No edema Neuro - Normal strength Skin - No rashes Psych - normal mood, and behavior   Assessment/Plan:  Coralyn Helling, MD [place] [facility]/Critical [facility]/Sleep Pager:  [phone] 01/08/2013, 4:25 PM

## 2013-01-08 NOTE — Assessment & Plan Note (Signed)
Stable on current inhaler regimen.  I explained that his urine retention issues after back surgery were unlikely to be related to his inhaler use.  Prefer that he continues his inhalers unless there is strong contra-indication from urology.

## 2013-02-04 ENCOUNTER — Other Ambulatory Visit: Payer: Self-pay | Admitting: Neurosurgery

## 2013-02-04 DIAGNOSIS — M545 Low back pain: Secondary | ICD-10-CM

## 2013-02-05 ENCOUNTER — Ambulatory Visit
Admission: RE | Admit: 2013-02-05 | Discharge: 2013-02-05 | Disposition: A | Discharge status: Home / Self Care | Payer: Medicare Other | Source: Ambulatory Visit | Attending: Neurosurgery | Admitting: Neurosurgery

## 2013-02-05 DIAGNOSIS — M545 Low back pain: Secondary | ICD-10-CM

## 2013-02-05 MED ORDER — GADOBENATE DIMEGLUMINE 529 MG/ML IV SOLN
20.0000 mL | Freq: Once | INTRAVENOUS | Status: AC | PRN
  Administered 2013-02-05: 20 mL via INTRAVENOUS

## 2013-03-15 ENCOUNTER — Other Ambulatory Visit (INDEPENDENT_AMBULATORY_CARE_PROVIDER_SITE_OTHER): Payer: Medicare Other

## 2013-03-15 ENCOUNTER — Encounter: Payer: Self-pay | Admitting: Pulmonary Disease

## 2013-03-15 ENCOUNTER — Ambulatory Visit (INDEPENDENT_AMBULATORY_CARE_PROVIDER_SITE_OTHER): Payer: Medicare Other | Admitting: Pulmonary Disease

## 2013-03-15 VITALS — BP 112/72 | HR 104 | Temp 97.7°F | Ht 72.0 in | Wt 265.0 lb

## 2013-03-15 DIAGNOSIS — D869 Sarcoidosis, unspecified: Secondary | ICD-10-CM

## 2013-03-15 DIAGNOSIS — M79609 Pain in unspecified limb: Secondary | ICD-10-CM

## 2013-03-15 DIAGNOSIS — J99 Respiratory disorders in diseases classified elsewhere: Secondary | ICD-10-CM

## 2013-03-15 DIAGNOSIS — D860 Sarcoidosis of lung: Secondary | ICD-10-CM

## 2013-03-15 DIAGNOSIS — J449 Chronic obstructive pulmonary disease, unspecified: Secondary | ICD-10-CM

## 2013-03-15 DIAGNOSIS — R0982 Postnasal drip: Secondary | ICD-10-CM | POA: Insufficient documentation

## 2013-03-15 DIAGNOSIS — M79604 Pain in right leg: Secondary | ICD-10-CM | POA: Insufficient documentation

## 2013-03-15 LAB — BASIC METABOLIC PANEL
Calcium: 9.4 mg/dL (ref 8.4–10.5)
GFR: 54.2 mL/min — ABNORMAL LOW (ref >60.00)
Potassium: 3.9 mEq/L (ref 3.5–5.1)
Sodium: 134 mEq/L — ABNORMAL LOW (ref 135–145)

## 2013-03-15 NOTE — Assessment & Plan Note (Signed)
Clinically stable since being of prednisone.  Will repeat CT chest with contrast to monitor stability >> will call him with results.

## 2013-03-15 NOTE — Assessment & Plan Note (Signed)
His "wheeze" is likely from upper airway irritation related to post nasal drip.  He is to try nasal irrigation.  Advised him to call if his symptoms persist.

## 2013-03-15 NOTE — Patient Instructions (Signed)
Will schedule CT chest >> will call with results Follow up in 6 months

## 2013-03-15 NOTE — Addendum Note (Signed)
Addended by: Caryl Ada on: 03/15/2013 02:49 PM   Modules accepted: Orders

## 2013-03-15 NOTE — Progress Notes (Signed)
Chief Complaint  Patient presents with  . COPD    Breathing is doing well. Reports wheezing. Denies SOB, chest tightness or coughing.    History of Present Illness: Travis Ferguson is a 72 y.o. male former smoker with COPD, mild OSA, and sarcoidosis.  He has noticed more sinus congestion and post-nasal drip.  His wife is concerned about him having more wheeze >> this is coming more from his throat.  He is not having cough, or sputum production.  He denies chest tightness/pain.  His main concern is related to leg pain >> this is causing significant limitation to his activity.  He has been followed by Dr. Wynetta Emery with neurosurgery.  He is scheduled for CT abd/pelvis to assess whether there is a vascular component to his symptoms.  He takes tylenol on a regular basis to help with pain control >> this works better than opiate medications.  He is concerned about side effects with long term use of acetaminophen.  TESTS: PSG May 2008>> AHI 8  Spirometry 11/18/08>>FEV1 2.15 (61%), FEV1% 62  CT chest 05/19/12>>superior segment LLL infiltrate, b/l hilar and mediastinal LAN with calcification CT chest 07/13/12 >> atherosclerosis, Lt hilar adenopathy, LLL superior segment narrowing, mass like opacity superior segment LLL 5.4 x 1.9 cm, multiple nodular areas LLL, diffuse GGO LLL PET scan 07/22/12 >> mildly hypermetabolic mediastinal adenopathy (SUV 5.8), superior segment nodules hypermetabolic (SUV 12.1) Bronchoscopy 07/28/12 >> focal granulomatous inflammation 07/31/12 >>start prednisone trial; ACE 37 CT chest 08/31/12 >> decreased size of lesions CT chest 12/02/12 >> borderline mediastinal/hilar LAN, nodular septal thickening b/l, no change superior segment LLL, centrilobular emphysema  Travis Ferguson  has a past medical history of COPD (chronic obstructive pulmonary disease); Depression; PTSD (post-traumatic stress disorder); OSA (obstructive sleep apnea); Migraine; GERD (gastroesophageal reflux disease);  Dyslipidemia; Rhinitis; Cervical disc disease; Subdural hematoma (11/10); Hypertension; and Shortness of breath.  Travis Ferguson  has past surgical history that includes Craniotomy (11/10); Video bronchoscopy (07/28/2012); Hernia repair (Bilateral); Knee arthroscopy (Right); and Lumbar laminectomy/decompression microdiscectomy (Right, 12/15/2012).  Prior to Admission medications   Medication Sig Start Date End Date Taking? Authorizing Provider  acetaminophen (TYLENOL) 500 MG tablet Take 1,000 mg by mouth as needed.    Yes Historical Provider, MD  albuterol (PROVENTIL HFA;VENTOLIN HFA) 108 (90 BASE) MCG/ACT inhaler 1-2 puffs every 4-6 hours as needed 05/18/12  Yes Coralyn Helling, MD  budesonide-formoterol (SYMBICORT) 160-4.5 MCG/ACT inhaler Inhale 2 puffs into the lungs 2 (two) times daily. 05/11/12  Yes Tammy S Parrett, NP  ezetimibe-simvastatin (VYTORIN) 10-40 MG per tablet Take 1 tablet by mouth at bedtime.     Yes Historical Provider, MD  FLUoxetine (PROZAC) 40 MG capsule Take 40 mg by mouth daily.     Yes Historical Provider, MD  losartan-hydrochlorothiazide (HYZAAR) 50-12.5 MG per tablet 1/2 tab by mouth once daily 03/23/12  Yes Historical Provider, MD  metoprolol tartrate (LOPRESSOR) 25 MG tablet Take 1 tablet by mouth daily. 04/06/12  Yes Historical Provider, MD  Multiple Vitamin (MULTIVITAMIN) capsule Take 1 capsule by mouth daily.     Yes Historical Provider, MD  Omega-3 Fatty Acids (EQL OMEGA 3 FISH OIL) 1200 MG CPDR Take 1 capsule by mouth daily.    Yes Historical Provider, MD  Probiotic Product (PROBIOTIC DAILY PO) Take by mouth.   Yes Historical Provider, MD  tiotropium (SPIRIVA) 18 MCG inhalation capsule Place 1 capsule (18 mcg total) into inhaler and inhale daily. 01/28/12  Yes Coralyn Helling, MD    No  Known Allergies   Physical Exam:  General - No distress ENT - no sinus tenderness, clear nasal discharge, raspy voice, no oral exudate, no LAN Cardiac - s1s2 regular, no murmur Chest -  prolonged exhalation, no wheeze/rales Back - No focal tenderness Abd - Soft, non-tender Ext - No edema Neuro - Normal strength Skin - No rashes Psych - normal mood, and behavior   Assessment/Plan:  Coralyn Helling, MD [place] [facility]/Critical Care/Sleep Pager:  [phone] 03/15/2013, 2:15 PM

## 2013-03-15 NOTE — Assessment & Plan Note (Signed)
He will have CT abd/pelvis through Dr. Wynetta Emery.  Will see if he can get his CT chest done at the same time.

## 2013-03-15 NOTE — Assessment & Plan Note (Signed)
Stable on current inhaler regimen.  He got his flu shot from his PCP last week.

## 2013-03-19 ENCOUNTER — Telehealth: Payer: Self-pay | Admitting: Pulmonary Disease

## 2013-03-19 ENCOUNTER — Ambulatory Visit (INDEPENDENT_AMBULATORY_CARE_PROVIDER_SITE_OTHER)
Admission: RE | Admit: 2013-03-19 | Discharge: 2013-03-19 | Disposition: A | Discharge status: Home / Self Care | Payer: Medicare Other | Source: Ambulatory Visit | Attending: Pulmonary Disease | Admitting: Pulmonary Disease

## 2013-03-19 DIAGNOSIS — J99 Respiratory disorders in diseases classified elsewhere: Secondary | ICD-10-CM

## 2013-03-19 DIAGNOSIS — D860 Sarcoidosis of lung: Secondary | ICD-10-CM

## 2013-03-19 DIAGNOSIS — D869 Sarcoidosis, unspecified: Secondary | ICD-10-CM

## 2013-03-19 MED ORDER — IOHEXOL 300 MG/ML  SOLN
80.0000 mL | Freq: Once | INTRAMUSCULAR | Status: AC | PRN
  Administered 2013-03-19: 80 mL via INTRAVENOUS

## 2013-03-19 NOTE — Telephone Encounter (Signed)
Ct Chest W Contrast  03/19/2013   CLINICAL DATA:  Sarcoidosis, hemoptysis. Followup.  EXAM: CT CHEST WITH CONTRAST  TECHNIQUE: Multidetector CT imaging of the chest was performed during intravenous contrast administration.  CONTRAST:  80mL OMNIPAQUE IOHEXOL 300 MG/ML  SOLN  COMPARISON:  12/02/2012   FINDINGS:  Curvilinear left lower lobe medial aspect consolidation with peripheral pattern of the peribronchovascular nodularity is slightly increased when compared to the previous study. Mild diffuse emphysematous changes are noted. Curvilinear bilateral lower lobe scarring or atelectasis otherwise stable. No pneumothorax.  Representative partly calcified AP window lymph nodes measuring 0.9 cm maximally image 20 are stable. Pretracheal lymph node conglomerate measuring 1.9 cm image 21 is slightly larger, previously 1.7 cm at the same anatomic level. This could be due to slice selection differences. Similar sub carinal 1.8 cm lymph node conglomerate, previously similar.  Great vessels are normal in caliber. Moderate atheromatous coronary arterial and aortic calcifications are reidentified. Gallstones or sludge noted within the otherwise normal-appearing but incompletely imaged gallbladder. Heart size is normal. Mild fibroid gland inhomogeneity is stable. No acute osseous abnormality. Stable mid thoracic kyphosis with multilevel loss of vertebral body height.   IMPRESSION:  Slight increase in extent of nodular left lower lobe consolidation with areas of peripheral peribronchovascular nodular opacity which, combined with the presence of essentially stable mediastinal and hilar lymphadenopathy, is compatible with the provided history of sarcoidosis.  Electronically Signed   By: Christiana Pellant M.D.   On: 03/19/2013 14:26    Results discussed with pt over the phone.  He is not having significant respiratory symptoms.  Findings more in line with sarcoidosis rather than concern for malignancy.  Plan is to continue  clinical monitoring off prednisone and f/u CT chest in several months.

## 2013-03-26 ENCOUNTER — Other Ambulatory Visit: Payer: Self-pay | Admitting: Neurosurgery

## 2013-03-26 DIAGNOSIS — M5126 Other intervertebral disc displacement, lumbar region: Secondary | ICD-10-CM

## 2013-03-30 ENCOUNTER — Ambulatory Visit
Admission: RE | Admit: 2013-03-30 | Discharge: 2013-03-30 | Disposition: A | Discharge status: Home / Self Care | Payer: Medicare Other | Source: Ambulatory Visit | Attending: Neurosurgery | Admitting: Neurosurgery

## 2013-03-30 DIAGNOSIS — M5126 Other intervertebral disc displacement, lumbar region: Secondary | ICD-10-CM

## 2013-03-30 MED ORDER — IOHEXOL 350 MG/ML SOLN
150.0000 mL | Freq: Once | INTRAVENOUS | Status: AC | PRN
  Administered 2013-03-30: 150 mL via INTRAVENOUS

## 2013-04-19 ENCOUNTER — Telehealth: Payer: Self-pay | Admitting: *Deleted

## 2013-04-26 ENCOUNTER — Encounter: Payer: Self-pay | Admitting: Vascular Surgery

## 2013-04-26 ENCOUNTER — Encounter: Payer: Self-pay | Admitting: Cardiovascular Disease

## 2013-04-26 ENCOUNTER — Ambulatory Visit (INDEPENDENT_AMBULATORY_CARE_PROVIDER_SITE_OTHER): Payer: Medicare Other | Admitting: Cardiovascular Disease

## 2013-04-26 VITALS — BP 130/92 | HR 93 | Ht 72.0 in | Wt 261.8 lb

## 2013-04-26 DIAGNOSIS — I2510 Atherosclerotic heart disease of native coronary artery without angina pectoris: Secondary | ICD-10-CM | POA: Insufficient documentation

## 2013-04-26 DIAGNOSIS — I10 Essential (primary) hypertension: Secondary | ICD-10-CM

## 2013-04-26 DIAGNOSIS — E785 Hyperlipidemia, unspecified: Secondary | ICD-10-CM

## 2013-04-26 NOTE — Assessment & Plan Note (Signed)
He was on Vytorin however Dr. Waynard Edwards has put this on hold and is giving him a statin holiday because of bilateral leg pain

## 2013-04-26 NOTE — Patient Instructions (Signed)
Your physician wants you to follow-up in: 6 months with an extender and 1 year with Dr Berry. You will receive a reminder letter in the mail two months in advance. If you don't receive a letter, please call our office to schedule the follow-up appointment.  

## 2013-04-26 NOTE — Progress Notes (Signed)
04/26/2013 [name]   1940/09/24  [id number]  Primary Physician Ezequiel Kayser, MD Primary Cardiologist: Runell Gess MD Roseanne Reno   HPI:  The patient is a very pleasant 72 year old moderately overweight married Caucasian male, father of 2, grandfather to 1 grandchild, who is a retired Aeronautical engineer. He is referred through the courtesy of Dr. Waynard Edwards for cardiovascular evaluation because of chronic dyspnea and exertional chest pressure.   His cardiac risk factor profile is remarkable for remote discontinued tobacco abuse back in 1996. He smoked a pipe at that time. He has treated hypertension and hyperlipidemia. He does have a family history of heart disease with a brother who had bypass surgery in his mid 10s. He has never had a heart attack or stroke. His past surgical history is remarkable for subdural hematoma and brain surgery in the past. He has been worked up by Dr. Craige Cotta for question of lung CA, which has never been clinically documented. He did have a chest CT that showed, among other things, plaque and atherosclerosis in his coronary arteries.   The patient suddenly had a 2-D echocardiogram and Myoview stress test which showed normal left for function and no evidence of ischemia. He denies chest pain. He has had back surgery on dull/L5 for the last saw him. 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    Current Outpatient Prescriptions  Medication Sig Dispense Refill  . albuterol (PROVENTIL HFA;VENTOLIN HFA) 108 (90 BASE) MCG/ACT inhaler Inhale 2 puffs into the lungs every 6 (six) hours as needed for wheezing.      . budesonide-formoterol (SYMBICORT) 160-4.5 MCG/ACT inhaler Inhale 2 puffs into the lungs 2 (two) times daily.  2 Inhaler  0  . Cholecalciferol (VITAMIN D) 2000 UNITS CAPS Take 2,000 Units by mouth daily.       . finasteride (PROSCAR) 5 MG tablet Take 1 tablet by mouth  daily.      [name] FLUoxetine (PROZAC) 40 MG capsule Take 40 mg by mouth daily.        [name] HYDROcodone-acetaminophen (NORCO/VICODIN) 5-325 MG per tablet Take 2 tablets by mouth every 8 (eight) hours as needed for pain.      [name] losartan-hydrochlorothiazide (HYZAAR) 50-12.5 MG per tablet Take 0.5 tablets by mouth daily.       . metoprolol tartrate (LOPRESSOR) 25 MG tablet Take 25 mg by mouth daily.       . Multiple Vitamin (MULTIVITAMIN) capsule Take 1 capsule by mouth daily.        . Omega-3 Fatty Acids (EQL OMEGA 3 FISH OIL) 1200 MG CPDR Take 1,200 mg by mouth daily.       . Probiotic Product (PROBIOTIC DAILY PO) Take 1 tablet by mouth daily.       [name] tiotropium (SPIRIVA) 18 MCG inhalation capsule Place 1 capsule (18 mcg total) into inhaler and inhale daily.  90 capsule  3  . ezetimibe-simvastatin (VYTORIN) 10-40 MG per tablet Take 1 tablet by mouth at bedtime. Taken off by PCP @ this time due to muscle aches       No current facility-administered medications for this visit.    Allergies  Allergen Reactions  . Statins     Muscle aches    History   Social History  . Marital Status: Married    Spouse Name: N/A    Number of Children: N/A  . Years of Education: N/A  Occupational History  . retired IT sales professional in 1997    Social History Main Topics  . Smoking status: Former Smoker -- 25 years    Types: Pipe    Quit date: 06/24/2008  . Smokeless tobacco: Never Used  . Alcohol Use: No  . Drug Use: No  . Sexual Activity: Not on file   Other Topics Concern  . Not on file   Social History Narrative  . No narrative on file     Review of Systems: General: negative for chills, fever, night sweats or weight changes.  Cardiovascular: negative for chest pain, dyspnea on exertion, edema, orthopnea, palpitations, paroxysmal nocturnal dyspnea or shortness of breath Dermatological: negative for rash Respiratory: negative for cough or wheezing Urologic: negative for hematuria Abdominal:  negative for nausea, vomiting, diarrhea, bright red blood per rectum, melena, or hematemesis Neurologic: negative for visual changes, syncope, or dizziness All other systems reviewed and are otherwise negative except as noted above.    Blood pressure 130/92, pulse 93, height 6' (1.829 m), weight 261 lb 12.8 oz (118.752 kg).  General appearance: alert and no distress Neck: no adenopathy, no carotid bruit, no JVD, supple, symmetrical, trachea midline and thyroid not enlarged, symmetric, no tenderness/mass/nodules Lungs: clear to auscultation bilaterally Heart: regular rate and rhythm, S1, S2 normal, no murmur, click, rub or gallop Extremities: extremities normal, atraumatic, no cyanosis or edema  EKG normal sinus rhythm at 93 without ST or T wave changes  ASSESSMENT AND PLAN:   Hyperlipidemia He was on Vytorin however Dr. Waynard Edwards has put this on hold and is giving him a statin holiday because of bilateral leg pain  Essential hypertension Borderline control today on antihypertensive medications. He did not sleep well last night and is a  Coronary artery calcification seen on CAT scan Low-risk Myoview a normal 2-D echo performed 6 months ago. The patient denies chest pain.      Runell Gess MD FACP,FACC,FAHA, [facility] 04/26/2013 9:10 AM

## 2013-04-26 NOTE — Assessment & Plan Note (Signed)
Low-risk Myoview a normal 2-D echo performed 6 months ago. The patient denies chest pain.

## 2013-04-26 NOTE — Assessment & Plan Note (Signed)
Borderline control today on antihypertensive medications. He did not sleep well last night and is a

## 2013-04-27 ENCOUNTER — Ambulatory Visit (INDEPENDENT_AMBULATORY_CARE_PROVIDER_SITE_OTHER): Payer: Medicare Other | Admitting: Vascular Surgery

## 2013-04-27 ENCOUNTER — Encounter: Payer: Self-pay | Admitting: Vascular Surgery

## 2013-04-27 VITALS — BP 122/84 | HR 106 | Resp 18 | Ht 72.0 in | Wt 259.0 lb

## 2013-04-27 DIAGNOSIS — I728 Aneurysm of other specified arteries: Secondary | ICD-10-CM | POA: Insufficient documentation

## 2013-04-27 NOTE — Progress Notes (Signed)
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 He is a very pleasant 72 year old gentleman who's had multiple medical issues. Most recently he had a severe back pain with degenerative disc disease and underwent L4-5 fusion. I. he continues to have difficulty and therefore underwent a CT angiogram for further evaluation rule out arterial source. 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 He reports a severe lower surety I. discomfort and reports this in his back pain are a 20 on a scale from 1-10.  Past Medical History  Diagnosis Date  . COPD (chronic obstructive pulmonary disease)   . Depression   . PTSD (post-traumatic stress disorder)   . OSA (obstructive sleep apnea)   . Migraine   . GERD (gastroesophageal reflux disease)   . Dyslipidemia   . Rhinitis   . Cervical disc disease   . 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 Hypertension   . Shortness of breath   . Coronary artery calcification seen on CAT scan     History  Substance Use Topics  . Smoking status: Former Smoker -- 25 years    Types: Pipe    Quit date: 06/25/1995  . Smokeless tobacco: Never Used  . Alcohol Use: No    Family History  Problem Relation Age of Onset  . Coronary artery disease      Allergies  Allergen Reactions  . Statins     Muscle aches    Current outpatient prescriptions:albuterol (PROVENTIL HFA;VENTOLIN HFA) 108 (90 BASE) MCG/ACT inhaler, Inhale 2 puffs into the lungs every 6 (six) hours as needed for wheezing., Disp: , Rfl: ;  budesonide-formoterol (SYMBICORT) 160-4.5 MCG/ACT inhaler, Inhale 2 puffs into the lungs 2 (two) times daily., Disp: 2 Inhaler, Rfl: 0;  Cholecalciferol (VITAMIN D) 2000 UNITS CAPS, Take 2,000 Units by mouth daily. , Disp: , Rfl:  finasteride (PROSCAR) 5 MG tablet, Take 1 tablet by mouth daily., Disp: , Rfl: ;  FLUoxetine (PROZAC) 40 MG capsule, Take 40 mg by mouth daily.   , Disp: , Rfl: ;  HYDROcodone-acetaminophen (NORCO/VICODIN) 5-325 MG per tablet, Take 2 tablets by mouth every 8 (eight) hours as needed for pain., Disp: , Rfl: ;  losartan-hydrochlorothiazide (HYZAAR) 50-12.5 MG per tablet, Take 0.5 tablets by mouth daily. , Disp: , Rfl:  metoprolol tartrate (LOPRESSOR) 25 MG tablet, Take 25 mg by mouth daily. , Disp: , Rfl: ;  Multiple Vitamin (MULTIVITAMIN) capsule, Take 1 capsule by mouth daily.  , Disp: , Rfl: ;  Omega-3 Fatty Acids (EQL OMEGA 3 FISH OIL) 1200 MG CPDR, Take 1,200 mg by mouth daily. , Disp: , Rfl: ;  Probiotic Product (PROBIOTIC DAILY PO), Take 1 tablet by mouth daily. , Disp: , Rfl:  tiotropium (SPIRIVA) 18 MCG inhalation capsule, Place 1 capsule (18 mcg total) into inhaler and inhale daily., Disp: 90 capsule, Rfl: 3;  ezetimibe-simvastatin (VYTORIN) 10-40 MG per tablet, Take 1 tablet by mouth at bedtime. Taken off by PCP @ this time due to muscle aches, Disp: , Rfl:   BP 122/84  Pulse 106  Resp 18  Ht 6' (1.829 m)  Wt 259 lb (117.482 kg)  BMI 35.12 kg/m2  Body mass index is 35.12 kg/(m^2).       Review of systems positive for leg discomfort in COPD otherwise negative.  Exam well-developed well-nourished gentleman in no acute distress. He does have some shortness of breath at rest. Neurologically he is  grossly intact Respirations are equal and nonlabored with no wheezes currently Heart regular rate and rhythm  Abdomen moderate obesity with no masses noted Skin without ulcers or rashes Pulse status 2+ radial 2+ femoral 2+ dorsalis pedis pulses with no evidence of peripheral artery aneurysm Musculoskeletal no major deformities  I did review his CT scan and discussed this with the patient. This was from 03/30/2013. This does show tortuosity of the splenic artery and at the bend in the tortuosity there is a small aneurysmal outpouching measuring slightly below 1 cm. There is calcification around this area.  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 I discussed options with the patient. I explained that there is no risk of rupture at its current size. A very slight possibility of increased size over time. I am comfortable and not following this with serial scans and the patient agrees with this. He will continue his evaluation determine cause of his pain. He will see [place] on an as-needed basis

## 2013-04-29 ENCOUNTER — Other Ambulatory Visit: Payer: Self-pay

## 2013-06-09 ENCOUNTER — Other Ambulatory Visit: Payer: Self-pay | Admitting: Neurosurgery

## 2013-06-09 DIAGNOSIS — M545 Low back pain: Secondary | ICD-10-CM

## 2013-06-14 ENCOUNTER — Ambulatory Visit
Admission: RE | Admit: 2013-06-14 | Discharge: 2013-06-14 | Disposition: A | Discharge status: Home / Self Care | Payer: Medicare Other | Source: Ambulatory Visit | Attending: Neurosurgery | Admitting: Neurosurgery

## 2013-06-14 VITALS — BP 94/56 | HR 90

## 2013-06-14 DIAGNOSIS — M545 Low back pain, unspecified: Secondary | ICD-10-CM

## 2013-06-14 MED ORDER — IOHEXOL 180 MG/ML  SOLN
15.0000 mL | Freq: Once | INTRAMUSCULAR | Status: AC | PRN
  Administered 2013-06-14: 15 mL via INTRATHECAL

## 2013-06-14 MED ORDER — DIAZEPAM 5 MG PO TABS
5.0000 mg | ORAL_TABLET | Freq: Once | ORAL | Status: AC
  Administered 2013-06-14: 5 mg via ORAL

## 2013-06-14 MED ORDER — HYDROCODONE-ACETAMINOPHEN 5-325 MG PO TABS
1.0000 | ORAL_TABLET | Freq: Once | ORAL | Status: AC
  Administered 2013-06-14: 1 via ORAL

## 2013-07-07 ENCOUNTER — Ambulatory Visit (INDEPENDENT_AMBULATORY_CARE_PROVIDER_SITE_OTHER): Payer: Medicare Other | Admitting: Pulmonary Disease

## 2013-07-07 ENCOUNTER — Encounter: Payer: Self-pay | Admitting: Pulmonary Disease

## 2013-07-07 VITALS — BP 102/78 | HR 119 | Temp 97.8°F | Ht 72.0 in | Wt 259.0 lb

## 2013-07-07 DIAGNOSIS — D869 Sarcoidosis, unspecified: Secondary | ICD-10-CM

## 2013-07-07 DIAGNOSIS — J99 Respiratory disorders in diseases classified elsewhere: Secondary | ICD-10-CM

## 2013-07-07 DIAGNOSIS — D860 Sarcoidosis of lung: Secondary | ICD-10-CM

## 2013-07-07 DIAGNOSIS — J449 Chronic obstructive pulmonary disease, unspecified: Secondary | ICD-10-CM

## 2013-07-07 NOTE — Patient Instructions (Signed)
Will schedule CT chest for March 2015 and call with results Follow up in 6 months

## 2013-07-07 NOTE — Progress Notes (Signed)
Chief Complaint  Patient presents with  . COPD    Breathing is doing well. Denies chest tightness, SOB, coughing or wheezing.    History of Present Illness: Travis Ferguson is a 73 y.o. male former smoker with COPD, mild OSA, and sarcoidosis.  His breathing has been okay.  He is not having cough, wheeze, sputum, fever, hemoptysis, or chest pain.  His main concern is related to back and leg pains.  This has been very debilitating.  He was recently started on opiate medications, and this has helped maintain his function and allow better sleep.  He was seen by vascular surgery recently for splenic artery aneurysm >> he was advised no intervention needed for this.  TESTS: PSG May 2008>> AHI 8  Spirometry 11/18/08>>FEV1 2.15 (61%), FEV1% 62  CT chest 05/19/12>>superior segment LLL infiltrate, b/l hilar and mediastinal LAN with calcification CT chest 07/13/12 >> atherosclerosis, Lt hilar adenopathy, LLL superior segment narrowing, mass like opacity superior segment LLL 5.4 x 1.9 cm, multiple nodular areas LLL, diffuse GGO LLL PET scan 07/22/12 >> mildly hypermetabolic mediastinal adenopathy (SUV 5.8), superior segment nodules hypermetabolic (SUV 78.4) Bronchoscopy 07/28/12 >> focal granulomatous inflammation 07/31/12 >>start prednisone trial; ACE 37 CT chest 08/31/12 >> decreased size of lesions CT chest 12/02/12 >> borderline mediastinal/hilar LAN, nodular septal thickening b/l, no change superior segment LLL, centrilobular emphysema CT chest 03/19/13 >> ?slight increase in LLL consolidative change, borderline LAN  Travis Ferguson  has a past medical history of COPD (chronic obstructive pulmonary disease); Depression; PTSD (post-traumatic stress disorder); OSA (obstructive sleep apnea); Migraine; GERD (gastroesophageal reflux disease); Dyslipidemia; Rhinitis; Cervical disc disease; Subdural hematoma (11/10); Hypertension; Shortness of breath; and Coronary artery calcification seen on CAT scan.  Travis Ferguson  has past surgical history that includes Craniotomy (11/10); Video bronchoscopy (07/28/2012); Hernia repair (Bilateral); Knee arthroscopy (Right); and Lumbar laminectomy/decompression microdiscectomy (Right, 12/15/2012).  Prior to Admission medications   Medication Sig Start Date End Date Taking? Authorizing Provider  acetaminophen (TYLENOL) 500 MG tablet Take 1,000 mg by mouth as needed.    Yes Historical Provider, MD  albuterol (PROVENTIL HFA;VENTOLIN HFA) 108 (90 BASE) MCG/ACT inhaler 1-2 puffs every 4-6 hours as needed 05/18/12  Yes Chesley Mires, MD  budesonide-formoterol (SYMBICORT) 160-4.5 MCG/ACT inhaler Inhale 2 puffs into the lungs 2 (two) times daily. 05/11/12  Yes Tammy S Parrett, NP  ezetimibe-simvastatin (VYTORIN) 10-40 MG per tablet Take 1 tablet by mouth at bedtime.     Yes Historical Provider, MD  FLUoxetine (PROZAC) 40 MG capsule Take 40 mg by mouth daily.     Yes Historical Provider, MD  losartan-hydrochlorothiazide (HYZAAR) 50-12.5 MG per tablet 1/2 tab by mouth once daily 03/23/12  Yes Historical Provider, MD  metoprolol tartrate (LOPRESSOR) 25 MG tablet Take 1 tablet by mouth daily. 04/06/12  Yes Historical Provider, MD  Multiple Vitamin (MULTIVITAMIN) capsule Take 1 capsule by mouth daily.     Yes Historical Provider, MD  Omega-3 Fatty Acids (EQL OMEGA 3 FISH OIL) 1200 MG CPDR Take 1 capsule by mouth daily.    Yes Historical Provider, MD  Probiotic Product (PROBIOTIC DAILY PO) Take by mouth.   Yes Historical Provider, MD  tiotropium (SPIRIVA) 18 MCG inhalation capsule Place 1 capsule (18 mcg total) into inhaler and inhale daily. 01/28/12  Yes Chesley Mires, MD    Allergies  Allergen Reactions  . Statins Other (See Comments)    Muscle aches     Physical Exam:  General - No distress ENT -  no sinus tenderness, no oral exudate, no LAN Cardiac - s1s2 regular, no murmur Chest - prolonged exhalation, no wheeze/rales Back - No focal tenderness Abd - Soft, non-tender Ext -  No edema Neuro - Normal strength Skin - No rashes Psych - normal mood, and behavior   Assessment/Plan:  Chesley Mires, MD [facility]/Sleep Pager:  [phone] 07/07/2013, 9:05 AM

## 2013-07-07 NOTE — Assessment & Plan Note (Signed)
Clinically stable.  Will plan for f/u CT chest with contrast in March 2015 and call him with results.

## 2013-07-07 NOTE — Assessment & Plan Note (Signed)
Stable on current regimen of spiriva, symbicort, and prn albuterol.

## 2013-08-12 ENCOUNTER — Other Ambulatory Visit (INDEPENDENT_AMBULATORY_CARE_PROVIDER_SITE_OTHER): Payer: Medicare Other

## 2013-08-12 ENCOUNTER — Other Ambulatory Visit: Payer: Self-pay | Admitting: Pulmonary Disease

## 2013-08-12 DIAGNOSIS — D860 Sarcoidosis of lung: Secondary | ICD-10-CM

## 2013-08-12 DIAGNOSIS — D869 Sarcoidosis, unspecified: Secondary | ICD-10-CM

## 2013-08-12 DIAGNOSIS — J99 Respiratory disorders in diseases classified elsewhere: Secondary | ICD-10-CM

## 2013-08-12 LAB — BASIC METABOLIC PANEL
BUN: 16 mg/dL (ref 6–23)
CALCIUM: 9.8 mg/dL (ref 8.4–10.5)
CO2: 24 mEq/L (ref 19–32)
Chloride: 105 mEq/L (ref 96–112)
Creatinine, Ser: 0.9 mg/dL (ref 0.4–1.5)
GFR: 86.8 mL/min (ref >60.00)
GLUCOSE: 93 mg/dL (ref 70–99)
POTASSIUM: 4.4 meq/L (ref 3.5–5.1)
Sodium: 136 mEq/L (ref 135–145)

## 2013-08-24 ENCOUNTER — Ambulatory Visit (INDEPENDENT_AMBULATORY_CARE_PROVIDER_SITE_OTHER)
Admission: RE | Admit: 2013-08-24 | Discharge: 2013-08-24 | Disposition: A | Discharge status: Home / Self Care | Payer: Medicare Other | Source: Ambulatory Visit | Attending: Pulmonary Disease | Admitting: Pulmonary Disease

## 2013-08-24 ENCOUNTER — Telehealth: Payer: Self-pay | Admitting: Pulmonary Disease

## 2013-08-24 DIAGNOSIS — D869 Sarcoidosis, unspecified: Secondary | ICD-10-CM

## 2013-08-24 MED ORDER — IOHEXOL 300 MG/ML  SOLN
80.0000 mL | Freq: Once | INTRAMUSCULAR | Status: AC | PRN
  Administered 2013-08-24: 80 mL via INTRAVENOUS

## 2013-08-24 NOTE — Telephone Encounter (Addendum)
Ct Chest W Contrast  08/24/2013    CLINICAL DATA:  Pulmonary sarcoidosis.   COPD.  EXAM: CT CHEST WITH CONTRAST TECHNIQUE: Multidetector CT imaging of the chest was performed during intravenous contrast administration. CONTRAST:  52mL OMNIPAQUE IOHEXOL 300 MG/ML  SOLN   COMPARISON:  CT CHEST W/CM dated 03/19/2013; CT CHEST W/CM dated 12/02/2012; CT CHEST W/CM dated 08/31/2012; CT CHEST W/CM dated 05/15/2012; NM PET IMAGE INITIAL (PI) SKULL BASE TO THIGH dated 07/22/2012; CT C SPINE W/CM dated 05/14/2006  FINDINGS:  Right lower paratracheal node, 1.9 cm in short axis (stable). The partially calcified AP window lymph node, 1.2 cm in short axis. Scattered small calcified lymph nodes in the mediastinum and hila. Calcified at subcarinal adenopathy, 2.1 cm in short axis, stable.  Numerous small gallstones fill the gallbladder, as before. Extensive coronary artery atherosclerotic calcification. It is  There is narrowing of the mainstem bronchi and proximal branches. Chronic confluent airspace opacity in the superior segment right lower lobe medially, with nodular components, similar to prior exams. Mild peripheral airspace opacity in the right lower lobe antral laterally, probably from atelectasis.  Chronic lucency in the right upper sternum, not hypermetabolic on prior PET-CT, potentially due to arthropathy, prior trauma, or chondroid lesion. IMPRESSION:  1. Progressive narrowing of the mainstem bronchi and adjacent bronchial branches which may be due to sarcoidosis, bronchitis/infection, or tracheobronchomalacia. 2. Continued bandlike opacity with nodular components medially in the superior segment left lower lobe likely directly related to sarcoidosis. Stable adenopathy in the chest with scattered calcifications likewise likely a manifestation of sarcoidosis. . Dense coronary artery atherosclerosis.  4. Indistinct airspace opacity peripherally in the right lower lobe is new and favors atelectasis over pneumonia.   5. Numerous small gallstones in the gallbladder.  6. Chronic lucency in the right upper sternum, not hypermetabolic on prior PET-CT, potentially due to arthropathy, prior trauma, or chondroid  Electronically Signed   By: Sherryl Barters M.D.   On: 08/24/2013 12:01    Results d/w pt.  He has stable findings.  No change to current tx plan.  Also discussed his need for opiate medication to control back pain.  Explained that at this point benefit of opiate medicine appears to outweigh potential side effect risks.

## 2013-10-14 NOTE — Telephone Encounter (Signed)
Encounter Closed--TP 10/14/2013

## 2013-10-22 ENCOUNTER — Other Ambulatory Visit: Payer: Self-pay | Admitting: Physical Medicine and Rehabilitation

## 2013-10-22 DIAGNOSIS — M25559 Pain in unspecified hip: Secondary | ICD-10-CM

## 2013-10-28 ENCOUNTER — Ambulatory Visit
Admission: RE | Admit: 2013-10-28 | Discharge: 2013-10-28 | Disposition: A | Discharge status: Home / Self Care | Payer: Medicare Other | Source: Ambulatory Visit | Attending: Physical Medicine and Rehabilitation | Admitting: Physical Medicine and Rehabilitation

## 2013-10-28 DIAGNOSIS — M25559 Pain in unspecified hip: Secondary | ICD-10-CM

## 2013-11-01 ENCOUNTER — Other Ambulatory Visit: Payer: Self-pay | Admitting: Physical Medicine and Rehabilitation

## 2013-11-01 DIAGNOSIS — M25559 Pain in unspecified hip: Secondary | ICD-10-CM

## 2013-11-10 ENCOUNTER — Ambulatory Visit
Admission: RE | Admit: 2013-11-10 | Discharge: 2013-11-10 | Disposition: A | Discharge status: Home / Self Care | Payer: Medicare Other | Source: Ambulatory Visit | Attending: Physical Medicine and Rehabilitation | Admitting: Physical Medicine and Rehabilitation

## 2013-11-10 DIAGNOSIS — M25559 Pain in unspecified hip: Secondary | ICD-10-CM

## 2013-11-10 MED ORDER — IOHEXOL 180 MG/ML  SOLN
15.0000 mL | Freq: Once | INTRAMUSCULAR | Status: AC | PRN
  Administered 2013-11-10: 15 mL via INTRA_ARTICULAR

## 2014-01-07 ENCOUNTER — Encounter: Payer: Self-pay | Admitting: Internal Medicine

## 2014-01-13 ENCOUNTER — Encounter: Payer: Self-pay | Admitting: Pulmonary Disease

## 2014-01-13 ENCOUNTER — Ambulatory Visit (INDEPENDENT_AMBULATORY_CARE_PROVIDER_SITE_OTHER): Payer: Medicare Other | Admitting: Pulmonary Disease

## 2014-01-13 VITALS — BP 132/78 | HR 83 | Ht 72.0 in | Wt 267.6 lb

## 2014-01-13 DIAGNOSIS — J449 Chronic obstructive pulmonary disease, unspecified: Secondary | ICD-10-CM

## 2014-01-13 DIAGNOSIS — D860 Sarcoidosis of lung: Secondary | ICD-10-CM

## 2014-01-13 DIAGNOSIS — J99 Respiratory disorders in diseases classified elsewhere: Secondary | ICD-10-CM

## 2014-01-13 DIAGNOSIS — D869 Sarcoidosis, unspecified: Secondary | ICD-10-CM

## 2014-01-13 NOTE — Patient Instructions (Signed)
Follow up in 4 months 

## 2014-01-13 NOTE — Progress Notes (Signed)
Chief Complaint  Patient presents with  . Follow-up    Pt reports no complaints with breathing since last OV. Pt states that pain from back surgery is still consistent. Pt is in the donut hole--requests samples.     History of Present Illness: Travis Ferguson is a 73 y.o. male former smoker with COPD, mild OSA, and sarcoidosis.  His breathing has been okay.  He does not get much cough, wheeze, or sputum.  His main difficulty is with debilitating back pain.  He is in constant discomfort from this.  This significantly impacts his ability to stay active.  TESTS: PSG May 2008>> AHI 8  Spirometry 11/18/08>>FEV1 2.15 (61%), FEV1% 62  CT chest 05/19/12>>superior segment LLL infiltrate, b/l hilar and mediastinal LAN with calcification CT chest 07/13/12 >> atherosclerosis, Lt hilar adenopathy, LLL superior segment narrowing, mass like opacity superior segment LLL 5.4 x 1.9 cm, multiple nodular areas LLL, diffuse GGO LLL PET scan 07/22/12 >> mildly hypermetabolic mediastinal adenopathy (SUV 5.8), superior segment nodules hypermetabolic (SUV 73.5) Bronchoscopy 07/28/12 >> focal granulomatous inflammation 07/31/12 >>start prednisone trial; ACE 37 CT chest 08/31/12 >> decreased size of lesions CT chest 12/02/12 >> borderline mediastinal/hilar LAN, nodular septal thickening b/l, no change superior segment LLL, centrilobular emphysema CT chest 03/19/13 >> ?slight increase in LLL consolidative change, borderline LAN CT chest 08/24/13 >> 1.9 cm Rt paratracheal LAN, calcified 2.1 cm subcarinal LAN, narrowing of mainstem bronchi, chronic infiltrate LLL  PMHx, PSHx, Medications, Allergies, Fhx, Shx reviewed.  Physical Exam:  General - No distress ENT - no sinus tenderness, no oral exudate, no LAN Cardiac - s1s2 regular, no murmur Chest - prolonged exhalation, no wheeze/rales Back - No focal tenderness Abd - Soft, non-tender Ext - No edema Neuro - Normal strength Skin - No rashes Psych - normal mood, and  behavior   Assessment/Plan:  Travis Mires, MD [facility]/Sleep Pager:  [phone] 01/13/2014, 3:21 PM

## 2014-01-17 NOTE — Assessment & Plan Note (Signed)
Continue to monitor off therapy.  Will reassess at f/u in 4 months, and then decide when f/u CT chest is needed.

## 2014-01-17 NOTE — Assessment & Plan Note (Signed)
Stable on current regimen of symbicort, spiriva, and prn albuterol.

## 2014-02-21 ENCOUNTER — Other Ambulatory Visit: Payer: Self-pay | Admitting: Pulmonary Disease

## 2014-02-21 ENCOUNTER — Encounter: Payer: Self-pay | Admitting: Adult Health

## 2014-02-21 ENCOUNTER — Ambulatory Visit (INDEPENDENT_AMBULATORY_CARE_PROVIDER_SITE_OTHER): Payer: Medicare Other | Admitting: Adult Health

## 2014-02-21 ENCOUNTER — Ambulatory Visit (INDEPENDENT_AMBULATORY_CARE_PROVIDER_SITE_OTHER)
Admission: RE | Admit: 2014-02-21 | Discharge: 2014-02-21 | Disposition: A | Discharge status: Home / Self Care | Payer: Medicare Other | Source: Ambulatory Visit | Attending: Adult Health | Admitting: Adult Health

## 2014-02-21 VITALS — BP 112/76 | HR 112 | Temp 97.6°F | Ht 72.0 in | Wt 270.6 lb

## 2014-02-21 DIAGNOSIS — J449 Chronic obstructive pulmonary disease, unspecified: Secondary | ICD-10-CM

## 2014-02-21 MED ORDER — AMOXICILLIN-POT CLAVULANATE 875-125 MG PO TABS: 1.0000 | ORAL_TABLET | Freq: Two times a day (BID) | ORAL | Status: AC

## 2014-02-21 MED ORDER — PREDNISONE 10 MG PO TABS: ORAL_TABLET | ORAL | Status: DC

## 2014-02-21 MED ORDER — LEVALBUTEROL HCL 0.63 MG/3ML IN NEBU
0.6300 mg | INHALATION_SOLUTION | Freq: Once | RESPIRATORY_TRACT | Status: AC
  Administered 2014-02-21: 0.63 mg via RESPIRATORY_TRACT

## 2014-02-21 NOTE — Progress Notes (Signed)
   Subjective:    Patient ID: Travis Ferguson, male    DOB: January 04, 1941, 73 y.o.   MRN: [id number]  HPI 73 yo former smoker with COPD, mild OSA, and sarcoidosis.   02/21/2014 Acute OV  Complains of  prod cough with green/spotted  mucus, wheezing, increased SOB, tightness in chest, cold sweats, weakness/fatigue, some head congestion with right ear congestion x7days, worse x 4 days.  Has a lot of nasal congestion with mixed blood. Has some nausea.  No v/d, chest pain, orthopnea,  Last week with nerve injection in back last week at pain clinic. Feels so much better, pain has decreased substantially.      TESTS: PSG May 2008>> AHI 8  Spirometry 11/18/08>>FEV1 2.15 (61%), FEV1% 62  CT chest 05/19/12>>superior segment LLL infiltrate, b/l hilar and mediastinal LAN with calcification CT chest 07/13/12 >> atherosclerosis, Lt hilar adenopathy, LLL superior segment narrowing, mass like opacity superior segment LLL 5.4 x 1.9 cm, multiple nodular areas LLL, diffuse GGO LLL PET scan 07/22/12 >> mildly hypermetabolic mediastinal adenopathy (SUV 5.8), superior segment nodules hypermetabolic (SUV 69.4) Bronchoscopy 07/28/12 >> focal granulomatous inflammation 07/31/12 >>start prednisone trial; ACE 37 CT chest 08/31/12 >> decreased size of lesions CT chest 12/02/12 >> borderline mediastinal/hilar LAN, nodular septal thickening b/l, no change superior segment LLL, centrilobular emphysema CT chest 03/19/13 >> ?slight increase in LLL consolidative change, borderline LAN CT chest 08/24/13 >> 1.9 cm Rt paratracheal LAN, calcified 2.1 cm subcarinal LAN, narrowing of mainstem bronchi, chronic infiltrate LLL  Review of Systems Constitutional:   No  weight loss, night sweats,  Fevers, chills,  +fatigue, or  lassitude.  HEENT:   No headaches,  Difficulty swallowing,  Tooth/dental problems, or  Sore throat,                No sneezing, itching, ear ache,  +nasal congestion, post nasal drip,   CV:  No chest pain,  Orthopnea,  PND, swelling in lower extremities, anasarca, dizziness, palpitations, syncope.   GI  No heartburn, indigestion, abdominal pain, nausea, vomiting, diarrhea, change in bowel habits, loss of appetite, bloody stools.   Resp: .  No chest wall deformity  Skin: no rash or lesions.  GU: no dysuria, change in color of urine, no urgency or frequency.  No flank pain, no hematuria   MS:  No joint pain or swelling.  No decreased range of motion.  No back pain.  Psych:  No change in mood or affect. No depression or anxiety.  No memory loss.         Objective:   Physical Exam GEN: A/Ox3; pleasant , NAD, obese    HEENT:  [place]/AT,  EACs-clear, TMs-wnl, NOSE-clear, THROAT-clear, no lesions, no postnasal drip or exudate noted.   NECK:  Supple w/ fair ROM; no JVD; normal carotid impulses w/o bruits; no thyromegaly or nodules palpated; no lymphadenopathy.  RESP  Exp wheezing , no accessory muscle use, no dullness to percussion, no stridor, speaks in full sentences   CARD:  RRR, no m/r/g  , tr  peripheral edema, pulses intact, no cyanosis or clubbing.  GI:   Soft & nt; nml bowel sounds; no organomegaly or masses detected.  Musco: Warm bil, no deformities or joint swelling noted.   Neuro: alert, no focal deficits noted.    Skin: Warm, no lesions or rashes         Assessment & Plan:

## 2014-02-21 NOTE — Addendum Note (Signed)
Addended by: Parke Poisson E on: 02/21/2014 04:38 PM   Modules accepted: Orders

## 2014-02-21 NOTE — Assessment & Plan Note (Signed)
Exacerbation  xopenex neb x 1 in office  cxr is pending   Plan  Augmentin 875mg   Twice daily  For 7 days  Mucinex DM Twice daily  As needed  Cough/congestion  Prednisone taper over next week.  Chest xray today . Follow up Dr. Halford Chessman  As planned and As needed   Please contact office for sooner follow up if symptoms do not improve or worsen or seek emergency care

## 2014-02-21 NOTE — Patient Instructions (Signed)
Augmentin 875mg   Twice daily  For 7 days  Mucinex DM Twice daily  As needed  Cough/congestion  Prednisone taper over next week.  Chest xray today . Follow up Dr. Halford Chessman  As planned and As needed   Please contact office for sooner follow up if symptoms do not improve or worsen or seek emergency care

## 2014-02-21 NOTE — Addendum Note (Signed)
Addended by: Parke Poisson E on: 02/21/2014 10:38 AM   Modules accepted: Orders

## 2014-02-22 ENCOUNTER — Telehealth: Payer: Self-pay | Admitting: Adult Health

## 2014-02-22 NOTE — Progress Notes (Signed)
Reviewed and agree with assessment/plan. 

## 2014-02-22 NOTE — Telephone Encounter (Signed)
cxr showed ? Early pneumonia at left base TP recommends to follow up in 2 weeks w/ cxr Appt scheduled for 9.11.15 @ 10am, with cxr prior Discussed with patient Nothing further needed; will sign off

## 2014-02-22 NOTE — Progress Notes (Signed)
Quick Note:  Called spoke with patient, advised of cxr results / recs as stated by TP. Pt verbalized his understanding and denied any questions. 2 week follow up scheduled for 9.11.15 w/ TP; pt to arrive early to have cxr prior. ______

## 2014-03-01 ENCOUNTER — Ambulatory Visit (AMBULATORY_SURGERY_CENTER): Payer: Self-pay

## 2014-03-01 VITALS — Ht 71.0 in | Wt 270.0 lb

## 2014-03-01 DIAGNOSIS — Z860100 Personal history of colon polyps, unspecified: Secondary | ICD-10-CM

## 2014-03-01 MED ORDER — MOVIPREP 100 G PO SOLR: 1.0000 | Freq: Once | ORAL | Status: DC

## 2014-03-01 NOTE — Progress Notes (Signed)
No allergies to eggs or soy No past problems with anesthesia No home oxygen No diet/weight loss meds  Has email  Emmi instructions given for colonoscopy 

## 2014-03-04 ENCOUNTER — Encounter: Payer: Self-pay | Admitting: Adult Health

## 2014-03-04 ENCOUNTER — Ambulatory Visit (INDEPENDENT_AMBULATORY_CARE_PROVIDER_SITE_OTHER): Payer: Medicare Other | Admitting: Adult Health

## 2014-03-04 ENCOUNTER — Ambulatory Visit (INDEPENDENT_AMBULATORY_CARE_PROVIDER_SITE_OTHER)
Admission: RE | Admit: 2014-03-04 | Discharge: 2014-03-04 | Disposition: A | Discharge status: Home / Self Care | Payer: Medicare Other | Source: Ambulatory Visit | Attending: Adult Health | Admitting: Adult Health

## 2014-03-04 VITALS — BP 122/76 | HR 101 | Temp 97.8°F | Ht 72.0 in | Wt 269.4 lb

## 2014-03-04 DIAGNOSIS — D860 Sarcoidosis of lung: Secondary | ICD-10-CM

## 2014-03-04 DIAGNOSIS — J449 Chronic obstructive pulmonary disease, unspecified: Secondary | ICD-10-CM

## 2014-03-04 DIAGNOSIS — D869 Sarcoidosis, unspecified: Secondary | ICD-10-CM

## 2014-03-04 DIAGNOSIS — J99 Respiratory disorders in diseases classified elsewhere: Secondary | ICD-10-CM

## 2014-03-04 MED ORDER — MOXIFLOXACIN HCL 400 MG PO TABS: 400.0000 mg | ORAL_TABLET | Freq: Every day | ORAL | Status: DC

## 2014-03-04 NOTE — Progress Notes (Signed)
Reviewed and agree with assessment/plan. 

## 2014-03-04 NOTE — Patient Instructions (Addendum)
Avelox 400mg  daily for 7days  Mucinex DM Twice daily  As needed  Cough/congestion  Follow up Dr. Halford Chessman in 3-4 weeks with chest xray  Please contact office for sooner follow up if symptoms do not improve or worsen or seek emergency care

## 2014-03-04 NOTE — Assessment & Plan Note (Signed)
Slow to resolve flare  ? CXR y PNA vs chronic scarring w/ undelrying sarcoid Previous CT in past w/ scarring noted.  Repeat cxr on return , consider CT if needed for persistent symptoms.   Plan  Avelox 400mg  daily for 7days  Mucinex DM Twice daily  As needed  Cough/congestion  Follow up Dr. Halford Chessman in 3-4 weeks with chest xray  Please contact office for sooner follow up if symptoms do not improve or worsen or seek emergency care

## 2014-03-04 NOTE — Addendum Note (Signed)
Addended by: Melvenia Needles on: 03/04/2014 04:33 PM   Modules accepted: Orders

## 2014-03-04 NOTE — Progress Notes (Signed)
Subjective:    Patient ID: Travis Ferguson, male    DOB: [birth year], 73 y.o.   MRN: [id number]  HPI  73 yo former smoker with COPD, mild OSA, and sarcoidosis.   02/21/14  Acute OV  Complains of  prod cough with green/spotted  mucus, wheezing, increased SOB, tightness in chest, cold sweats, weakness/fatigue, some head congestion with right ear congestion x7days, worse x 4 days.  Has a lot of nasal congestion with mixed blood. Has some nausea.  No v/d, chest pain, orthopnea,  Last week with nerve injection in back last week at pain clinic. Feels so much better, pain has decreased substantially.   >augmenitn x 7   03/04/2014 Follow up  Returns for  2 week follow up for COPD flare .  Seen last ov with acute bronchitis /COPD flare , tx w/ augmentin and steroid taper. CXR showed streaky left basilar atx vs infiltrate.  Returns today and  reports breathing is about 50% improved since last ov.  still having a lot of weakness/fatigue.   Seen by PCP last week , started albuterol nebs at home Seems to be helping . CXR today shows persistent bibasilar atx slightly worse on left.  No hemoptysis , chest pain , orthopnea, edema or fever.    TESTS: PSG May 2008>> AHI 8  Spirometry 11/18/08>>FEV1 2.15 (61%), FEV1% 62  CT chest 05/19/12>>superior segment LLL infiltrate, b/l hilar and mediastinal LAN with calcification CT chest 07/13/12 >> atherosclerosis, Lt hilar adenopathy, LLL superior segment narrowing, mass like opacity superior segment LLL 5.4 x 1.9 cm, multiple nodular areas LLL, diffuse GGO LLL PET scan 07/22/12 >> mildly hypermetabolic mediastinal adenopathy (SUV 5.8), superior segment nodules hypermetabolic (SUV 17.6) Bronchoscopy 07/28/12 >> focal granulomatous inflammation 07/31/12 >>start prednisone trial; ACE 37 CT chest 08/31/12 >> decreased size of lesions CT chest 12/02/12 >> borderline mediastinal/hilar LAN, nodular septal thickening b/l, no change superior segment LLL, centrilobular  emphysema CT chest 03/19/13 >> ?slight increase in LLL consolidative change, borderline LAN CT chest 08/24/13 >> 1.9 cm Rt paratracheal LAN, calcified 2.1 cm subcarinal LAN, narrowing of mainstem bronchi, chronic infiltrate LLL  Review of Systems  Constitutional:   No  weight loss, night sweats,  Fevers, chills,  +fatigue, or  lassitude.  HEENT:   No headaches,  Difficulty swallowing,  Tooth/dental problems, or  Sore throat,                No sneezing, itching, ear ache,  +nasal congestion, post nasal drip,   CV:  No chest pain,  Orthopnea, PND, swelling in lower extremities, anasarca, dizziness, palpitations, syncope.   GI  No heartburn, indigestion, abdominal pain, nausea, vomiting, diarrhea, change in bowel habits, loss of appetite, bloody stools.   Resp: .  No chest wall deformity  Skin: no rash or lesions.  GU: no dysuria, change in color of urine, no urgency or frequency.  No flank pain, no hematuria   MS:  No joint pain or swelling.  No decreased range of motion.  No back pain.  Psych:  No change in mood or affect. No depression or anxiety.  No memory loss.         Objective:   Physical Exam  GEN: A/Ox3; pleasant , NAD, obese    HEENT:  [place]/AT,  EACs-clear, TMs-wnl, NOSE-clear, THROAT-clear, no lesions, no postnasal drip or exudate noted.   NECK:  Supple w/ fair ROM; no JVD; normal carotid impulses w/o bruits; no thyromegaly or nodules palpated; no lymphadenopathy.  RESP  No  wheezing , no accessory muscle use, no dullness to percussion, no stridor, speaks in full sentences   CARD:  RRR, no m/r/g  , tr  peripheral edema, pulses intact, no cyanosis or clubbing.  GI:   Soft & nt; nml bowel sounds; no organomegaly or masses detected.  Musco: Warm bil, no deformities or joint swelling noted.   Neuro: alert, no focal deficits noted.    Skin: Warm, no lesions or rashes   cXR 02/21/14 >Central mild bronchitic changes. Streaky left basilar atelectasis or  infiltrate.  No pulmonary edema.   cxr 03/04/2014 >Stable basilar predominant streaky  pulmonary opacity slightly worse on the left.       Assessment & Plan:

## 2014-03-09 ENCOUNTER — Telehealth: Payer: Self-pay | Admitting: Pulmonary Disease

## 2014-03-09 NOTE — Telephone Encounter (Signed)
[id number] x1 

## 2014-03-10 NOTE — Telephone Encounter (Signed)
Pt returned call- [phone]

## 2014-03-10 NOTE — Telephone Encounter (Signed)
Pt seen on 03/04/14 with the following instructions:    Patient Instructions     Avelox 400mg  daily for 7days  Mucinex DM Twice daily As needed Cough/congestion  Follow up Dr. Halford Chessman in 3-4 weeks with chest xray  Please contact office for sooner follow up if symptoms do not improve or worsen or seek emergency care    VS's first available was 04/14/14  Pt asking if this date is okay or if he should come back to see TP sooner  He is aware TP out of the office until 03/14/14  Please advise thanks!

## 2014-03-14 ENCOUNTER — Telehealth: Payer: Self-pay | Admitting: Internal Medicine

## 2014-03-14 ENCOUNTER — Ambulatory Visit (INDEPENDENT_AMBULATORY_CARE_PROVIDER_SITE_OTHER): Payer: Medicare Other | Admitting: Internal Medicine

## 2014-03-14 ENCOUNTER — Encounter: Payer: Self-pay | Admitting: Internal Medicine

## 2014-03-14 VITALS — BP 138/98 | HR 101 | Ht 71.0 in | Wt 272.0 lb

## 2014-03-14 DIAGNOSIS — J449 Chronic obstructive pulmonary disease, unspecified: Secondary | ICD-10-CM

## 2014-03-14 MED ORDER — PREDNISONE 10 MG PO TABS: ORAL_TABLET | ORAL | Status: DC

## 2014-03-14 NOTE — Progress Notes (Signed)
Subjective:    Patient ID: Travis Ferguson, male    DOB: 1941/05/22, 73 y.o.   MRN: [id number]  HPI    OV 03/14/2014 -acuet wviistmale former smoker with COPD, mild OSA, and sarcoidosis  TESTS:  PSG May 2008>> AHI 8  Spirometry 11/18/08>>FEV1 2.15 (61%), FEV1% 62  CT chest 05/19/12>>superior segment LLL infiltrate, b/l hilar and mediastinal LAN with calcification  CT chest 07/13/12 >> atherosclerosis, Lt hilar adenopathy, LLL superior segment narrowing, mass like opacity superior segment LLL 5.4 x 1.9 cm, multiple nodular areas LLL, diffuse GGO LLL  PET scan 07/22/12 >> mildly hypermetabolic mediastinal adenopathy (SUV 5.8), superior segment nodules hypermetabolic (SUV 07.3)  Bronchoscopy 07/28/12 >> focal granulomatous inflammation  07/31/12 >>start prednisone trial; ACE 37  CT chest 08/31/12 >> decreased size of lesions  CT chest 12/02/12 >> borderline mediastinal/hilar LAN, nodular septal thickening b/l, no change superior segment LLL, centrilobular emphysema  CT chest 03/19/13 >> ?slight increase in LLL consolidative change, borderline LAN  CT chest 08/24/13 >> 1.9 cm Rt paratracheal LAN, calcified 2.1 cm subcarinal LAN, narrowing of mainstem bronchi, chronic infiltrate LLL  Chief Complaint  Patient presents with  . Acute Visit    VS pt. Pt was here on 03/04/2014 with TP for an acute visit. Pt was told to f/u with VS. Pt c/o persistant cough with yellow mucus. Pt  c/o SOB and CP in morning when coughing.     PAtient with complicated past medical hx as above and below.  Says saw Dr Halford Chessman in [phone] and was doing ok. Then end august 2015 had cough and AECopd symptoms. Saw NP and given abx and 1 week pred that helped but then declined. Saw NP again 03/04/14 and given another round abx but again feeling worse wthi recurring cough, chest tightness, white yellow sputum, nocturnal symptoms. . Symptoms are transiently relieved by nebs but still feels miserable. Prednisone helps but he is not on chronic  prednisone/. No worsening edema, orthopnea  Image review shows LLL consolidation unchanged 2013 -> early2015. 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   has past surgical history that includes Craniotomy (11/10); Video bronchoscopy (07/28/2012); Hernia repair (Bilateral); Knee arthroscopy (Right); and Lumbar laminectomy/decompression microdiscectomy (Right, 12/15/2012).   reports that he quit smoking about 18 years ago. His smoking use included Pipe. He has never used smokeless tobacco.  Immunization History  Administered Date(s) Administered  . Influenza Split 01/23/2011, 03/24/2013  . Influenza Whole 03/27/2009, 03/16/2010, 02/23/2012  . Pneumococcal Polysaccharide-23 04/27/2007, 10/22/2012   Allergies  Allergen Reactions  . Statins Other (See Comments)    Muscle aches     Review of Systems  Constitutional: Negative for fever and unexpected weight change.  HENT: Positive for congestion. Negative for dental problem, ear pain, nosebleeds, postnasal drip, rhinorrhea, sinus pressure, sneezing, sore throat and trouble swallowing.   Eyes: Negative for redness and itching.  Respiratory: Positive for cough, chest tightness and shortness of breath. Negative for wheezing.   Cardiovascular: Negative for palpitations and leg swelling.  Gastrointestinal: Negative for nausea and vomiting.  Genitourinary: Negative  for dysuria.  Musculoskeletal: Negative for joint swelling.  Skin: Negative for rash.  Neurological:  Negative for headaches.  Hematological: Does not bruise/bleed easily.  Psychiatric/Behavioral: Negative for dysphoric mood. The patient is not nervous/anxious.    Current outpatient prescriptions:albuterol (PROVENTIL) (2.5 MG/3ML) 0.083% nebulizer solution, Take 2.5 mg by nebulization every 6 (six) hours as needed for wheezing or shortness of breath., Disp: , Rfl: ;  budesonide-formoterol (SYMBICORT) 160-4.5 MCG/ACT inhaler, Inhale 2 puffs into the lungs 2 (two) times daily., Disp: 2 Inhaler, Rfl: 0 ezetimibe-simvastatin (VYTORIN) 10-40 MG per tablet, Take 1 tablet by mouth at bedtime. Taken off by PCP @ this time due to muscle aches, Disp: , Rfl: ;  finasteride (PROSCAR) 5 MG tablet, Take 1 tablet by mouth daily., Disp: , Rfl: ;  FLUoxetine (PROZAC) 40 MG capsule, Take 40 mg by mouth daily.  , Disp: , Rfl: ;  gabapentin (NEURONTIN) 300 MG capsule, 2 capsules by mouth three times daily, Disp: , Rfl:  HYDROcodone-acetaminophen (NORCO/VICODIN) 5-325 MG per tablet, Take 2 tablets by mouth every 6 (six) hours as needed. , Disp: , Rfl: ;  losartan-hydrochlorothiazide (HYZAAR) 50-12.5 MG per tablet, Take 0.5 tablets by mouth daily. , Disp: , Rfl: ;  metoprolol tartrate (LOPRESSOR) 25 MG tablet, Take 25 mg by mouth daily. , Disp: , Rfl: ;  Multiple Vitamin (MULTIVITAMIN) capsule, Take 1 capsule by mouth daily.  , Disp: , Rfl:  Omega-3 Fatty Acids (EQL OMEGA 3 FISH OIL) 1200 MG CPDR, Take 1,200 mg by mouth daily. , Disp: , Rfl: ;  PROAIR HFA 108 (90 BASE) MCG/ACT inhaler, 1-2 PUFFS EVERY 4-6 HOURS AS NEEDED, Disp: 8.5 each, Rfl: 1;  Probiotic Product (PROBIOTIC DAILY PO), Take 1 tablet by mouth daily. , Disp: , Rfl: ;  simvastatin (ZOCOR) 20 MG tablet, Take 1 tablet by mouth at bedtime. **HOLD**, Disp: , Rfl:  tiotropium (SPIRIVA) 18 MCG inhalation capsule, Place 1 capsule (18 mcg total) into inhaler and inhale daily., Disp: 90 capsule, Rfl: 3;  Cholecalciferol (VITAMIN D) 2000 UNITS CAPS, Take 2,000 Units by mouth  daily. , Disp: , Rfl: ;  MOVIPREP 100 G SOLR, Take 1 kit (200 g total) by mouth once., Disp: 1 kit, Rfl: 0     Objective:   Physical Exam  Nursing note and vitals reviewed. Constitutional: He is oriented to person, place, and time. He appears well-developed and well-nourished. No distress.  Obese Body mass index is 37.95 kg/(m^2).   HENT:  Head: Normocephalic and atraumatic.  Right Ear: External ear normal.  Left Ear: External ear normal.  Mouth/Throat: Oropharynx is clear and moist. No oropharyngeal exudate.  Eyes: Conjunctivae and EOM are normal. Pupils are equal, round, and reactive to light. Right eye exhibits no discharge. Left eye exhibits no discharge. No scleral icterus.  Neck: Normal range of motion. Neck supple. No JVD present. No tracheal deviation present. No thyromegaly present.  Cardiovascular: Normal rate, regular rhythm and intact distal pulses.  Exam reveals no gallop and no friction rub.   No murmur heard. Pulmonary/Chest: Effort normal and breath sounds normal. No respiratory distress. He has no wheezes. He has no rales. He exhibits no tenderness.  No wheeze Some LLL crack;les +  Abdominal: Soft. Bowel sounds are normal. He exhibits no distension and no mass. There is no tenderness. There is no rebound and no guarding.  Musculoskeletal: Normal range of motion. He exhibits no edema and no tenderness.  Antalgic gait Uses pain Mild  Chronic edema  Lymphadenopathy:  He has no cervical adenopathy.  Neurological: He is alert and oriented to person, place, and time. He has normal reflexes. No cranial nerve deficit. Coordination normal.  Skin: Skin is warm and dry. No rash noted. He is not diaphoretic. No erythema. No pallor.  Psychiatric: Judgment and thought content normal.  Flat affect Poor historian    Filed Vitals:   03/14/14 1627  BP: 138/98  Pulse: 101  Height: _0  (1.803 m)  Weight: 272 lb (123.378 kg)  SpO2: 91%        Assessment & Plan:    #AECOPD  Not sure why you are not better  - Possibilities are sarcoid flare - and you need longer steroids  - Post viral infection fatigue  Recommend - please stop fish oil  - continue all you regular medications as before  - will do longer steroid course - Please take Take prednisone 98m once daily x 3 days, then 31monce daily x 3 days, then 2063mnce daily x 3 days, then prednisone 70m38mce daily  x 3 days and stop - Do CT chest wo contrast - cancel endoscopy for tomorrow 03/15/14 AM   #Followup  - Dr SoodHalford Chessmanregroup on a) preoperative risk assessment for endoscopy; b) continue mgmt of your lung condition - See Dr SoodHalford Chessmanlier than 10/22;/15 if possible

## 2014-03-14 NOTE — Telephone Encounter (Signed)
Spoke with the pt  He states that he needs to come in today  Still has prod cough and breathing not back to baseline  Has appt for colonoscopy tomorrow  OV with MR for today at 4:30 pm

## 2014-03-14 NOTE — Patient Instructions (Addendum)
Not sure why you are not better  - Possibilities are sarcoid flare - and you need longer steroids  - Post viral infection fatigue  Recommend - please stop fish oil  - continue all you regular medications as before  - will do longer steroid course - Please take Take prednisone 40mg  once daily x 3 days, then 30mg  once daily x 3 days, then 20mg  once daily x 3 days, then prednisone 10mg  once daily  x 3 days and stop - Do CT chest wo contrast - cancel endoscopy for tomorrow 03/15/14 AM   #Followup  - Dr Halford Chessman to regroup on a) preoperative risk assessment for endoscopy; b) continue mgmt of your lung condition - See Dr Halford Chessman earlier than 10/22;/15 if possible

## 2014-03-14 NOTE — Telephone Encounter (Signed)
Hi Travis Ferguson  He [name] is sick. REcommend canceling endoscopy tomorrow. With fev1 60%, obesity and osa might need this in hospital anways but will let primary pulmonary doc Dr Halford Chessman decide on this at fu 10;/22/15  THanks  Dr. Brand Males, M.D., [facility].C.P Pulmonary and Critical Care Medicine Staff Physician [facility] Pager: [phone], If no answer or between  15:00h - 7:00h: call [phone]  03/14/2014 5:29 PM

## 2014-03-14 NOTE — Telephone Encounter (Signed)
Pt states he is still coughing & having issues.  Would like an answer, please.  Pt can be reached at [phone].  Travis Ferguson

## 2014-03-15 ENCOUNTER — Encounter: Payer: Medicare Other | Admitting: Internal Medicine

## 2014-03-15 ENCOUNTER — Telehealth: Payer: Self-pay | Admitting: Pulmonary Disease

## 2014-03-15 DIAGNOSIS — D860 Sarcoidosis of lung: Secondary | ICD-10-CM

## 2014-03-15 NOTE — Telephone Encounter (Signed)
Looks like order was never placed  I have placed order and spoke with Alida and advised this needs to be scheduled asap before his ov tomorrow at 1:45  I spoke with the spouse and notified her of this  She verbalized understanding  Nothing further needed

## 2014-03-15 NOTE — Progress Notes (Signed)
Reviewed and agree with assessment/plan. 

## 2014-03-15 NOTE — Telephone Encounter (Signed)
Pt states he will call the office back after speaking to his pulmonary doc tomorrow to schedule an OV with Dr. Henrene Pastor.

## 2014-03-15 NOTE — Telephone Encounter (Signed)
Murali, Thank you.  Vaughan Basta,   Have this guy see me in the office to decide appropriateness of colonoscopy ONLY AFTER he is "well" (for him)

## 2014-03-16 ENCOUNTER — Encounter: Payer: Self-pay | Admitting: Pulmonary Disease

## 2014-03-16 ENCOUNTER — Ambulatory Visit (INDEPENDENT_AMBULATORY_CARE_PROVIDER_SITE_OTHER)
Admission: RE | Admit: 2014-03-16 | Discharge: 2014-03-16 | Disposition: A | Discharge status: Home / Self Care | Payer: Medicare Other | Source: Ambulatory Visit | Attending: Internal Medicine | Admitting: Internal Medicine

## 2014-03-16 ENCOUNTER — Ambulatory Visit (INDEPENDENT_AMBULATORY_CARE_PROVIDER_SITE_OTHER): Payer: Medicare Other | Admitting: Pulmonary Disease

## 2014-03-16 VITALS — BP 118/78 | HR 97 | Temp 97.7°F | Ht 72.0 in | Wt 270.4 lb

## 2014-03-16 DIAGNOSIS — M545 Low back pain, unspecified: Secondary | ICD-10-CM

## 2014-03-16 DIAGNOSIS — D869 Sarcoidosis, unspecified: Secondary | ICD-10-CM

## 2014-03-16 DIAGNOSIS — J99 Respiratory disorders in diseases classified elsewhere: Secondary | ICD-10-CM

## 2014-03-16 DIAGNOSIS — R0600 Dyspnea, unspecified: Secondary | ICD-10-CM

## 2014-03-16 DIAGNOSIS — Z1211 Encounter for screening for malignant neoplasm of colon: Secondary | ICD-10-CM

## 2014-03-16 DIAGNOSIS — D860 Sarcoidosis of lung: Secondary | ICD-10-CM

## 2014-03-16 DIAGNOSIS — J90 Pleural effusion, not elsewhere classified: Secondary | ICD-10-CM

## 2014-03-16 DIAGNOSIS — R0789 Other chest pain: Secondary | ICD-10-CM

## 2014-03-16 DIAGNOSIS — J449 Chronic obstructive pulmonary disease, unspecified: Secondary | ICD-10-CM

## 2014-03-16 DIAGNOSIS — R0989 Other specified symptoms and signs involving the circulatory and respiratory systems: Secondary | ICD-10-CM

## 2014-03-16 DIAGNOSIS — R0609 Other forms of dyspnea: Secondary | ICD-10-CM

## 2014-03-16 NOTE — Progress Notes (Signed)
Chief Complaint  Patient presents with  . Follow-up    Saw MR 9/21-- no improvement. Pt states that he is unable to sleep. Using Symbicort and albuterol as directed. Pt has noticed a changein color of mucus from yellow to white.     History of Present Illness: Travis Ferguson is a 73 y.o. male former smoker with COPD, mild OSA, and sarcoidosis.  Since his last visit with me in July, he has been seen by Tammy Parrett twice and Dr. Chase Caller most recently.  At the end of August he developed productive cough with green sputum, wheezing, chest tightness, sweats, weakness, and dyspnea.  He was tx with Augmentin and prednisone.  CXR at that time was relatively unremarkable.  He was then started on albuterol nebulizer by his PCP, and this helped.  He was still have trouble, and was given avelox.  He still did not feel improvement, and then was started on additional prednisone.  He was also set up for CT chest.  He still has chest tightness, and feels short of breath.  His cough is better, and now bringing up mostly white sputum.  He denies fever.  His main concern is related to his back pain, and that he needs to have procedure to help alleviate this.  He also needs to have colonoscopy done at some point with Dr. Henrene Pastor.  TESTS: PSG May 2008>> AHI 8  Spirometry 11/18/08>>FEV1 2.15 (61%), FEV1% 62  Bronchoscopy 07/28/12 >> focal granulomatous inflammation Spirometry 03/14/14 >> FEV1 2.10 (60%), FEV1% 57   CHEST IMAGING: CT chest 05/19/12>>superior segment LLL infiltrate, b/l hilar and mediastinal LAN with calcification CT chest 07/13/12 >> atherosclerosis, Lt hilar adenopathy, LLL superior segment narrowing, mass like opacity superior segment LLL 5.4 x 1.9 cm, multiple nodular areas LLL, diffuse GGO LLL PET scan 07/22/12 >> mildly hypermetabolic mediastinal adenopathy (SUV 5.8), superior segment nodules hypermetabolic (SUV 56.2) CT chest 08/31/12 >> decreased size of lesions CT chest 12/02/12 >> borderline  mediastinal/hilar LAN, nodular septal thickening b/l, no change superior segment LLL, centrilobular emphysema CT chest 03/19/13 >> ?slight increase in LLL consolidative change, borderline LAN CT chest 08/24/13 >> 1.9 cm Rt paratracheal LAN, calcified 2.1 cm subcarinal LAN, narrowing of mainstem bronchi, chronic infiltrate LLL CT chest 03/16/14 >> atherosclerosis, no change in mediastinal and b/l hilar LAN, no change in LLL mass like opacity, thickening of interstitium, scattered sub-pleural nodules, small Lt pleural effusion, gallstones   PMHx, PSHx, Medications, Allergies, Fhx, Shx reviewed.  Physical Exam:  General - No distress ENT - no sinus tenderness, no oral exudate, no LAN Cardiac - s1s2 regular, no murmur Chest - prolonged exhalation, no wheeze/rales Back - No focal tenderness Abd - Soft, non-tender Ext - No edema Neuro - Normal strength Skin - No rashes Psych - normal mood, and behavior   Assessment/Plan:  Travis Mires, MD [place] [facility] Pager:  [phone] 03/16/2014, 2:12 PM

## 2014-03-16 NOTE — Patient Instructions (Signed)
Will arrange for assessment with Cardiology within this week Follow up in 1 week with Dr. Halford Chessman or Tammy Parrett with chest xray

## 2014-03-17 ENCOUNTER — Ambulatory Visit (INDEPENDENT_AMBULATORY_CARE_PROVIDER_SITE_OTHER): Payer: Medicare Other | Admitting: Physician Assistant

## 2014-03-17 ENCOUNTER — Encounter: Payer: Self-pay | Admitting: Physician Assistant

## 2014-03-17 VITALS — BP 124/98 | HR 95 | Ht 71.0 in | Wt 269.1 lb

## 2014-03-17 DIAGNOSIS — I2510 Atherosclerotic heart disease of native coronary artery without angina pectoris: Secondary | ICD-10-CM

## 2014-03-17 DIAGNOSIS — E785 Hyperlipidemia, unspecified: Secondary | ICD-10-CM

## 2014-03-17 DIAGNOSIS — D869 Sarcoidosis, unspecified: Secondary | ICD-10-CM

## 2014-03-17 DIAGNOSIS — J90 Pleural effusion, not elsewhere classified: Secondary | ICD-10-CM

## 2014-03-17 DIAGNOSIS — I10 Essential (primary) hypertension: Secondary | ICD-10-CM

## 2014-03-17 DIAGNOSIS — J449 Chronic obstructive pulmonary disease, unspecified: Secondary | ICD-10-CM

## 2014-03-17 DIAGNOSIS — R0602 Shortness of breath: Secondary | ICD-10-CM

## 2014-03-17 DIAGNOSIS — R0609 Other forms of dyspnea: Secondary | ICD-10-CM

## 2014-03-17 DIAGNOSIS — Z1211 Encounter for screening for malignant neoplasm of colon: Secondary | ICD-10-CM | POA: Insufficient documentation

## 2014-03-17 DIAGNOSIS — R0789 Other chest pain: Secondary | ICD-10-CM | POA: Insufficient documentation

## 2014-03-17 DIAGNOSIS — M549 Dorsalgia, unspecified: Secondary | ICD-10-CM | POA: Insufficient documentation

## 2014-03-17 DIAGNOSIS — R0989 Other specified symptoms and signs involving the circulatory and respiratory systems: Secondary | ICD-10-CM

## 2014-03-17 LAB — CBC WITH DIFFERENTIAL/PLATELET
BASOS PCT: 0.3 % (ref 0.0–3.0)
Basophils Absolute: 0 10*3/uL (ref 0.0–0.1)
Eosinophils Absolute: 0 10*3/uL (ref 0.0–0.7)
Eosinophils Relative: 0.2 % (ref 0.0–5.0)
HEMATOCRIT: 42.7 % (ref 39.0–52.0)
HEMOGLOBIN: 14.2 g/dL (ref 13.0–17.0)
LYMPHS ABS: 1.3 10*3/uL (ref 0.7–4.0)
Lymphocytes Relative: 11.8 % — ABNORMAL LOW (ref 12.0–46.0)
MCHC: 33.2 g/dL (ref 30.0–36.0)
MCV: 86.7 fl (ref 78.0–100.0)
MONOS PCT: 4.2 % (ref 3.0–12.0)
Monocytes Absolute: 0.5 10*3/uL (ref 0.1–1.0)
NEUTROS ABS: 9.3 10*3/uL — AB (ref 1.4–7.7)
Neutrophils Relative %: 83.5 % — ABNORMAL HIGH (ref 43.0–77.0)
Platelets: 306 10*3/uL (ref 150.0–400.0)
RBC: 4.93 Mil/uL (ref 4.22–5.81)
RDW: 14.2 % (ref 11.5–15.5)
WBC: 11.1 10*3/uL — ABNORMAL HIGH (ref 4.0–10.5)

## 2014-03-17 LAB — BASIC METABOLIC PANEL
BUN: 21 mg/dL (ref 6–23)
CHLORIDE: 107 meq/L (ref 96–112)
CO2: 21 meq/L (ref 19–32)
Calcium: 9.6 mg/dL (ref 8.4–10.5)
Creatinine, Ser: 1 mg/dL (ref 0.4–1.5)
GFR: 78.63 mL/min (ref >60.00)
GLUCOSE: 123 mg/dL — AB (ref 70–99)
POTASSIUM: 4.4 meq/L (ref 3.5–5.1)
Sodium: 137 mEq/L (ref 135–145)

## 2014-03-17 LAB — BRAIN NATRIURETIC PEPTIDE: Pro B Natriuretic peptide (BNP): 235 pg/mL — ABNORMAL HIGH (ref 0.0–100.0)

## 2014-03-17 NOTE — Patient Instructions (Signed)
Please look at your metoprolol bottle when you get home. If it is succinate, this is the right form. If it is tartrate, this will need to be changed to the long-acting succinate form. Please let us know.  Please contact Dr. Silvestre Mesi office to find out if he is OK with you starting baby aspirin given your history of subdural hematoma.  Your physician has requested that you have an echocardiogram. Echocardiography is a painless test that uses sound waves to create images of your heart. It provides your doctor with information about the size and shape of your heart and how well your heart's chambers and valves are working. This procedure takes approximately one hour. There are no restrictions for this procedure.  Your physician recommends that you have lab work:  TODAY - BMET, CBC w/diff, BNP  Your physician recommends that you schedule a follow-up appointment in: 2-3 weeks with Dr. Gwenlyn Found at [facility] office or with Melina Copa, PA-C

## 2014-03-17 NOTE — Assessment & Plan Note (Signed)
He has persistent chest discomfort.  Some of this might be related to recent respiratory infection, COPD exacerbation and pulmonary sarcoidosis.  However, he has been on several course of Abx and prednisone, and his symptoms persist.  He has significant coronary calcifications on CT chest.  Explained that some of his symptoms could be related to CAD >> will arrange for him to have assessment with cardiology.

## 2014-03-17 NOTE — Assessment & Plan Note (Addendum)
He has several course of Abx over the past month.  I don't think he needs additional Abx now.  Advised that he can use albuterol nebulizer prn only.  He is to continue symbicort, spiriva.

## 2014-03-17 NOTE — Assessment & Plan Note (Signed)
He will need to have this deferred until his respiratory and chest symptoms are improved.

## 2014-03-17 NOTE — Assessment & Plan Note (Signed)
Advised him to defer back intervention until after his current symptoms are improved.

## 2014-03-17 NOTE — Assessment & Plan Note (Signed)
He is to complete course of prednisone that he started on 03/14/14.

## 2014-03-17 NOTE — Progress Notes (Signed)
[place], [place], [place]  [address] Phone: [phone] Fax:  [phone]  Date:  03/17/2014   Patient ID:  Travis, Ferguson Feb 12, 1941, MRN [id number]   PCP:  Jerlyn Ly, MD  Cardiologist:  Dr. 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 He was seen by Dr. Gwenlyn Found in 2014 due to coronary calcium seen on CT, CP, and dyspnea and underwent nuc 09/2012 which was low risk with "inferior defect worse at stress than rest, likely represents bowel attenuation artifact", not gated due to PVCs. He also had an echocardiogram but the result appears abbreviated in Epic and in particular does not report on LV/RV function. These studies were felt to be low risk per Dr. Gwenlyn Found.  The patient presents with increased DOE lately for the last month. Prior to a month ago he was going to a type of pulmonary rehab and not significantly limited by his symptoms. However, he then developed DOE and cough with clear/yelow mucus. He has been treated by pulmonology with several course of antibiotics for AECOPD. Pulmonology started him recently on a steroid taper and this has improved his symptoms but has not brought him back to baseline. He underwent CT chest 03/16/14 which again showed chronic changes of sarcoidosis as well as coronary calcifications seen on prior imaging, and a new left small pleural effusion. The patient denies SOB at rest, chest pain, orthopnea, or LEE. He has gained weight gradually since a back pain problem started. He monitors O2 sats at home and stays between 90-95%. He was referred to [place] by pulmonology for concern for cardiac etiology of his dyspnea given coronary calcifications on CT. PCP follows his lipids. He's never had any major  bleeding except for a subdural hematoma in 2010 s/p surgery. He has not been on aspirin. He takes metoprolol for migraines which has successfully abated this issue.  Recent Labs: 08/12/2013: Creatinine 0.9; Potassium 4.4   Wt Readings from Last 3 Encounters:  03/17/14 269 lb 1.9 oz (122.072 kg)  03/16/14 270 lb 6.4 oz (122.653 kg)  03/14/14 272 lb (123.378 kg)     Past Medical History  Diagnosis Date  . 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  . Depression   . PTSD (post-traumatic stress disorder)   . OSA (obstructive sleep apnea)   . Migraine   . GERD (gastroesophageal reflux disease)   . Dyslipidemia   . Rhinitis   . Cervical disc disease   . Subdural hematoma 11/10    a. 2010 s/p surgery - diagnosed several weeks after a fall.  . Hypertension   . Coronary artery calcification seen on CAT scan   . Sarcoidosis     Current Outpatient Prescriptions  Medication Sig Dispense Refill  . albuterol (PROVENTIL) (2.5 MG/3ML) 0.083% nebulizer solution Take 2.5 mg by nebulization every 6 (six) hours as needed for wheezing or shortness of breath.      . budesonide-formoterol (SYMBICORT) 160-4.5 MCG/ACT inhaler Inhale 2 puffs into the lungs 2 (two) times daily.  2 Inhaler  0  . Cholecalciferol (VITAMIN D) 2000 UNITS CAPS Take 2,000 Units by mouth daily.       [name] ezetimibe-simvastatin (VYTORIN) 10-40 MG per tablet Take  1 tablet by mouth at bedtime. Taken off by PCP @ this time due to muscle aches      . finasteride (PROSCAR) 5 MG tablet Take 1 tablet by mouth daily.      [name] FLUoxetine (PROZAC) 40 MG capsule Take 40 mg by mouth daily.        [name] gabapentin (NEURONTIN) 300 MG capsule 2 capsules by mouth three times daily      . HYDROcodone-acetaminophen (NORCO/VICODIN) 5-325 MG per tablet Take 2 tablets by mouth every 6 (six) hours as needed.       [name] losartan-hydrochlorothiazide (HYZAAR) 50-12.5 MG per tablet Take 0.5 tablets by mouth daily.        . metoprolol tartrate (LOPRESSOR) 25 MG tablet Take 25 mg by mouth daily.       . Multiple Vitamin (MULTIVITAMIN) capsule Take 1 capsule by mouth daily.        . predniSONE (DELTASONE) 10 MG tablet Take 4 tabs po x 3 days, then 3 x 3 days, then 2 x 3 days, then 1 x 3 days then stop.  30 tablet  0  . PROAIR HFA 108 (90 BASE) MCG/ACT inhaler 1-2 PUFFS EVERY 4-6 HOURS AS NEEDED  8.5 each  1  . tiotropium (SPIRIVA) 18 MCG inhalation capsule Place 1 capsule (18 mcg total) into inhaler and inhale daily.  90 capsule  3   No current facility-administered medications for this visit.    Allergies:   Statins   Social History:  The patient  reports that he quit smoking about 18 years ago. His smoking use included Pipe. He has never used smokeless tobacco. He reports that he does not drink alcohol or use illicit drugs.   Family History:  The patient's family history includes Coronary artery disease in an other family member; Stomach cancer in his paternal grandmother. There is no history of Colon cancer, Pancreatic cancer, or Rectal cancer.  ROS:  Please see the history of present illness.   All other systems reviewed and negative.   PHYSICAL EXAM:  VS:  BP 124/98  Pulse 95  Ht 5\' 11"  (1.803 m)  Wt 269 lb 1.9 oz (122.072 kg)  BMI 37.55 kg/m2  SpO2 98% Well nourished, well developed overweight WM Body mass index is 37.55 kg/(m^2)., in no acute distress HEENT: normal Neck: no JVD Cardiac:  normal S1, S2; RRR; no murmur Lungs:  Coarse at bases, moderately decreased airmovement in upper 1/2 of lungs. No wheezing or rales Abd: soft, nontender, no hepatomegaly Ext: no edema Skin: warm and dry Neuro:  moves all extremities spontaneously, no focal abnormalities noted  EKG:  NSR 95bpm, occasional PVCs, small inferior Q waves, otherwise no acute ST-T changes, not significantly changed from prior  ASSESSMENT AND PLAN:  1. Dyspnea on exertion - with his significant pulm history, it's difficult to  discern lung from heart issues. Plan repeat 2D echo as well as basic labs today including BMET, BNP, CBC, TSH. In the setting of coronary calcification on CT, CAD is also a possibility. I discussed proceeding with LHC to further definitively evaluate his arteries but the patient declined at this time. He wants to start with noninvasive testing only right now in the form of an echo. I told him a normal echo does not preclude CAD but this is what he wants to start with, along with the blood work.  2. Chronic coronary calcification on CT - see above. He is on statin, followed closely by PCP due to h/o  statin intolerance. I asked him to ask Dr. Joylene Draft his opinion on whether we can start baby aspirin for CV protection, in light of his history of SDH. I also asked him to please clarify the type of metoprolol he is on at home - it's listed as tartrate 25mg  daily which would be incorrect dosing. If it is in fact tartrate, would change to metoprolol succinate 25mg  daily. 3. HTN - diastolic running high but the patient keeps a very close eye on it at home and has been running normal. No med changes today. 4. Sarcoidosis/COPD/pleural effusion - per pulmonology. Unclear if pleural effusion is related to cardiac or pulmonary issue. 5. HLD - followed by PCP.  Dispo: F/u 2-3 weeks with Dr. Gwenlyn Found or APP following echo result.  Signed, Melina Copa, PA-C  03/17/2014 1:21 PM

## 2014-03-17 NOTE — Assessment & Plan Note (Signed)
Not sure what cause of this is.  Effusion is too small to consider thoracentesis.  Will have him f/u in 2 weeks with CXR to monitor.

## 2014-03-18 ENCOUNTER — Other Ambulatory Visit: Payer: Self-pay | Admitting: Physician Assistant

## 2014-03-22 ENCOUNTER — Ambulatory Visit (HOSPITAL_COMMUNITY): Payer: Medicare Other | Attending: Physician Assistant

## 2014-03-22 ENCOUNTER — Other Ambulatory Visit (HOSPITAL_COMMUNITY): Payer: Self-pay | Admitting: Physician Assistant

## 2014-03-22 DIAGNOSIS — G4733 Obstructive sleep apnea (adult) (pediatric): Secondary | ICD-10-CM | POA: Diagnosis not present

## 2014-03-22 DIAGNOSIS — R0602 Shortness of breath: Secondary | ICD-10-CM | POA: Diagnosis present

## 2014-03-22 DIAGNOSIS — F4310 Post-traumatic stress disorder, unspecified: Secondary | ICD-10-CM | POA: Diagnosis not present

## 2014-03-22 DIAGNOSIS — F3289 Other specified depressive episodes: Secondary | ICD-10-CM | POA: Diagnosis not present

## 2014-03-22 DIAGNOSIS — F329 Major depressive disorder, single episode, unspecified: Secondary | ICD-10-CM | POA: Diagnosis not present

## 2014-03-22 DIAGNOSIS — D869 Sarcoidosis, unspecified: Secondary | ICD-10-CM | POA: Insufficient documentation

## 2014-03-22 DIAGNOSIS — I10 Essential (primary) hypertension: Secondary | ICD-10-CM | POA: Insufficient documentation

## 2014-03-22 DIAGNOSIS — E785 Hyperlipidemia, unspecified: Secondary | ICD-10-CM | POA: Diagnosis not present

## 2014-03-22 DIAGNOSIS — K219 Gastro-esophageal reflux disease without esophagitis: Secondary | ICD-10-CM | POA: Insufficient documentation

## 2014-03-22 DIAGNOSIS — Z87891 Personal history of nicotine dependence: Secondary | ICD-10-CM | POA: Diagnosis not present

## 2014-03-22 NOTE — Progress Notes (Signed)
2D Echo completed. 03/22/2014

## 2014-03-23 ENCOUNTER — Ambulatory Visit (INDEPENDENT_AMBULATORY_CARE_PROVIDER_SITE_OTHER): Payer: Medicare Other | Admitting: Adult Health

## 2014-03-23 ENCOUNTER — Encounter: Payer: Self-pay | Admitting: Adult Health

## 2014-03-23 ENCOUNTER — Telehealth: Payer: Self-pay | Admitting: *Deleted

## 2014-03-23 ENCOUNTER — Ambulatory Visit (INDEPENDENT_AMBULATORY_CARE_PROVIDER_SITE_OTHER)
Admission: RE | Admit: 2014-03-23 | Discharge: 2014-03-23 | Disposition: A | Discharge status: Home / Self Care | Payer: Medicare Other | Source: Ambulatory Visit | Attending: Adult Health | Admitting: Adult Health

## 2014-03-23 VITALS — BP 120/74 | HR 88 | Temp 98.2°F | Ht 72.0 in | Wt 271.0 lb

## 2014-03-23 DIAGNOSIS — J99 Respiratory disorders in diseases classified elsewhere: Secondary | ICD-10-CM

## 2014-03-23 DIAGNOSIS — D869 Sarcoidosis, unspecified: Secondary | ICD-10-CM

## 2014-03-23 DIAGNOSIS — J90 Pleural effusion, not elsewhere classified: Secondary | ICD-10-CM

## 2014-03-23 DIAGNOSIS — D860 Sarcoidosis of lung: Secondary | ICD-10-CM

## 2014-03-23 DIAGNOSIS — J449 Chronic obstructive pulmonary disease, unspecified: Secondary | ICD-10-CM

## 2014-03-23 MED ORDER — FUROSEMIDE 20 MG PO TABS: 20.0000 mg | ORAL_TABLET | Freq: Every day | ORAL | Status: DC

## 2014-03-23 NOTE — Telephone Encounter (Signed)
lmptcb for lab results 

## 2014-03-23 NOTE — Assessment & Plan Note (Signed)
Recent slow to resolve flare improving with steroid challenge.   Plan  Taper steroids as directed.

## 2014-03-23 NOTE — Telephone Encounter (Signed)
Follow up      Returning Travis Ferguson's call

## 2014-03-23 NOTE — Patient Instructions (Signed)
Take Lasix 20mg  daily for 3 days  Finish prednisone taper as directed  Continue on Symbicort and Spiriva  Follow up Dr. Halford Chessman  In 6-8 weeks and As needed   Please contact office for sooner follow up if symptoms do not improve or worsen or seek emergency care

## 2014-03-23 NOTE — Progress Notes (Signed)
Reviewed and agree with assessment/plan. 

## 2014-03-23 NOTE — Assessment & Plan Note (Signed)
Slow to resolve flare -improving  Echo showed some diastolic dysfunction w/ slightly elevated BNP  Wt tr up on steroids  will give short diuresis burst with lasix x 3 days  Advised to eat extra bananas (recent bmet nml scr and k+)  Advised to keep cards follow up this month  cxr today with mild interstitial promninence only   Plan  Take Lasix 20mg  daily for 3 days  Follow up Dr. Halford Chessman  In 6-8 weeks and As needed   Please contact office for sooner follow up if symptoms do not improve or worsen or seek emergency care

## 2014-03-23 NOTE — Progress Notes (Signed)
Subjective:    Patient ID: Travis Ferguson, male    DOB: May 21, 1941, 73 y.o.   MRN: [id number]  HPI 73 yo former smoker with COPD, mild OSA, and sarcoidosis.  03/23/2014 Follow up  Returns for 1 week follow  Feels breathing is much improved since last w/ the prednisone.  still very fatigued.   Seen by Cardiology last week, set up for a 2 D echo.  2D Echo yesterday showed EF 55-60%, grade 1 DD, PAP nml . BNP minimally elevated ~235  He is feeling much better, is tapering steroids . Cough is much better.  CXR today showed milid pulmonary vascular prominence and interstitial prominence .  On HCTZ daily 12.5mg  daily  No calf pain, orthopnea, fever, n/v/d     TESTS: PSG May 2008>> AHI 8  Spirometry 11/18/08>>FEV1 2.15 (61%), FEV1% 62  Bronchoscopy 07/28/12 >> focal granulomatous inflammation Spirometry 03/14/14 >> FEV1 2.10 (60%), FEV1% 57   CHEST IMAGING: CT chest 05/19/12>>superior segment LLL infiltrate, b/l hilar and mediastinal LAN with calcification CT chest 07/13/12 >> atherosclerosis, Lt hilar adenopathy, LLL superior segment narrowing, mass like opacity superior segment LLL 5.4 x 1.9 cm, multiple nodular areas LLL, diffuse GGO LLL PET scan 07/22/12 >> mildly hypermetabolic mediastinal adenopathy (SUV 5.8), superior segment nodules hypermetabolic (SUV 34.7) CT chest 08/31/12 >> decreased size of lesions CT chest 12/02/12 >> borderline mediastinal/hilar LAN, nodular septal thickening b/l, no change superior segment LLL, centrilobular emphysema CT chest 03/19/13 >> ?slight increase in LLL consolidative change, borderline LAN CT chest 08/24/13 >> 1.9 cm Rt paratracheal LAN, calcified 2.1 cm subcarinal LAN, narrowing of mainstem bronchi, chronic infiltrate LLL CT chest 03/16/14 >> atherosclerosis, no change in mediastinal and b/l hilar LAN, no change in LLL mass like opacity, thickening of interstitium, scattered sub-pleural nodules, small Lt pleural effusion, gallstones       Review of  Systems Constitutional:   No  weight loss, night sweats,  Fevers, chills, fatigue, or  lassitude.  HEENT:   No headaches,  Difficulty swallowing,  Tooth/dental problems, or  Sore throat,                No sneezing, itching, ear ache, nasal congestion, post nasal drip,   CV:  No chest pain,  Orthopnea, PND, swelling in lower extremities, anasarca, dizziness, palpitations, syncope.   GI  No heartburn, indigestion, abdominal pain, nausea, vomiting, diarrhea, change in bowel habits, loss of appetite, bloody stools.   Resp: No shortness of breath with exertion or at rest.  No excess mucus, no productive cough,  No non-productive cough,  No coughing up of blood.  No change in color of mucus.  No wheezing.  No chest wall deformity  Skin: no rash or lesions.  GU: no dysuria, change in color of urine, no urgency or frequency.  No flank pain, no hematuria   MS:  No joint pain or swelling.  No decreased range of motion.  No back pain.  Psych:  No change in mood or affect. No depression or anxiety.  No memory loss.         Objective:   Physical Exam GEN: A/Ox3; pleasant , NAD, obese   HEENT:  [place]/AT,  EACs-clear, TMs-wnl, NOSE-clear, THROAT-clear, no lesions, no postnasal drip or exudate noted.   NECK:  Supple w/ fair ROM; no JVD; normal carotid impulses w/o bruits; no thyromegaly or nodules palpated; no lymphadenopathy.  RESP  Clear  P & A; w/o, wheezes/ rales/ or rhonchi.no accessory muscle use, no  dullness to percussion  CARD:  RRR, no m/r/g  , tr  peripheral edema, pulses intact, no cyanosis or clubbing.  GI:   Soft & nt; nml bowel sounds; no organomegaly or masses detected.  Musco: Warm bil, no deformities or joint swelling noted.   Neuro: alert, no focal deficits noted.    Skin: Warm, no lesions or rashes         Assessment & Plan:

## 2014-03-24 ENCOUNTER — Telehealth: Payer: Self-pay | Admitting: Cardiovascular Disease

## 2014-03-24 NOTE — Telephone Encounter (Signed)
Hi Travis Ferguson,  I s/w pt earlier today about Travis Ferguson lab results see my notes. Pt wants to wait until he can talk to Dr. Gwenlyn Found about possible cath. See notes on Travis Ferguson lab work.

## 2014-03-24 NOTE — Telephone Encounter (Signed)
Hi JC,  I s/w pt earlier today about his lab results see my notes. Pt wants to wait until he can talk to Dr. Gwenlyn Found about possible cath. See notes on his lab work.

## 2014-03-24 NOTE — Telephone Encounter (Signed)
pt notified about lab results and said he wants to wait to s/w Dr. Gwenlyn Found more about needing cath. I said that will be fine. I reminded pt of his appt 10/20 with Tarri Fuller at [facility]

## 2014-03-24 NOTE — Telephone Encounter (Signed)
Pt called again,says he would like his results this morning.

## 2014-03-24 NOTE — Telephone Encounter (Signed)
Pt was returning somebody call from yesterday,concerning his test results. He says he definitely wants somebody to call today.

## 2014-03-24 NOTE — Telephone Encounter (Signed)
Called pt. And explained that we are still waiting for the Dr. to release this to the nurses

## 2014-03-25 NOTE — Telephone Encounter (Signed)
HEY , Travis Ferguson   Sounds like a plan to me. Thanks

## 2014-03-29 ENCOUNTER — Telehealth: Payer: Self-pay | Admitting: *Deleted

## 2014-03-29 DIAGNOSIS — R0602 Shortness of breath: Secondary | ICD-10-CM

## 2014-03-29 DIAGNOSIS — R0789 Other chest pain: Secondary | ICD-10-CM

## 2014-03-29 DIAGNOSIS — I10 Essential (primary) hypertension: Secondary | ICD-10-CM

## 2014-03-29 NOTE — Addendum Note (Signed)
Addended by: Michae Kava on: 03/29/2014 01:35 PM   Modules accepted: Orders

## 2014-03-29 NOTE — Telephone Encounter (Signed)
Follow up     Patient is returning Carol's call.  Please call today if possible

## 2014-03-29 NOTE — Telephone Encounter (Signed)
went over [place] instructions with pt. he asked if I could also send him a MY CHART message w/instructions as well, I said sure and I will put in the mail as well. Pt said thank you very much.

## 2014-03-29 NOTE — Telephone Encounter (Signed)
MYOVIEW ORDER DX CHANGED

## 2014-03-29 NOTE — Telephone Encounter (Signed)
s/w both pt and his wife in regards to echo results, both verbalized understanding to results given today. Pt would like to proceed w/myoview. I will let Melina Copa, PA know of this and have the office call to schedule myoview, pt said thank you.

## 2014-03-29 NOTE — Telephone Encounter (Signed)
lmptcb to go over [employer]

## 2014-04-06 ENCOUNTER — Ambulatory Visit (INDEPENDENT_AMBULATORY_CARE_PROVIDER_SITE_OTHER): Payer: Medicare Other | Admitting: Internal Medicine

## 2014-04-06 ENCOUNTER — Encounter: Payer: Self-pay | Admitting: Internal Medicine

## 2014-04-06 VITALS — BP 140/88 | HR 125

## 2014-04-06 DIAGNOSIS — I5030 Unspecified diastolic (congestive) heart failure: Secondary | ICD-10-CM | POA: Insufficient documentation

## 2014-04-06 DIAGNOSIS — I5032 Chronic diastolic (congestive) heart failure: Secondary | ICD-10-CM

## 2014-04-06 DIAGNOSIS — J449 Chronic obstructive pulmonary disease, unspecified: Secondary | ICD-10-CM

## 2014-04-06 MED ORDER — METHYLPREDNISOLONE ACETATE 80 MG/ML IJ SUSP
120.0000 mg | Freq: Once | INTRAMUSCULAR | Status: AC
  Administered 2014-04-06: 120 mg via INTRAMUSCULAR

## 2014-04-06 MED ORDER — PREDNISONE 10 MG PO TABS: ORAL_TABLET | ORAL | Status: DC

## 2014-04-06 MED ORDER — BISOPROLOL FUMARATE 5 MG PO TABS: ORAL_TABLET | ORAL | Status: DC

## 2014-04-06 NOTE — Assessment & Plan Note (Addendum)
DDX of  difficult airways management all start with A and  include Adherence, Ace Inhibitors, Acid Reflux, Active Sinus Disease, Alpha 1 Antitripsin deficiency, Anxiety masquerading as Airways dz,  ABPA,  allergy(esp in young), Aspiration (esp in elderly), Adverse effects of DPI,  Active smokers, plus two Bs  = Bronchiectasis and Beta blocker use..and one C= CHF  Adherence is always the initial "prime suspect" and is a multilayered concern that requires a "trust but verify" approach in every patient - starting with knowing how to use medications, especially inhalers, correctly, keeping up with refills and understanding the fundamental difference between maintenance and prns vs those medications only taken for a very short course and then stopped and not refilled.  - confused with how/ when to use inhalers/nebs - The proper method of use, as well as anticipated side effects, of a metered-dose inhaler are discussed and demonstrated to the patient. Improved effectiveness after extensive coaching during this visit to a level of approximately  75% from a baseline of nearly 0  ? Allergy/Prednisone 10 mg take  4 each am x 2 days,   2 each am x 2 days,  1 each am x 2 days and stop   ? Adverse effect of dpi > he has more asthma than copd and might be better off spiriva dpi but for now left him on it  ? BB effect/chf  > change to bisoprolol (see dias chf)   ?acid reflux > has h/o gerd so consider next adding ppi/ h2 hs     Each maintenance medication was reviewed in detail including most importantly the difference between maintenance and as needed and under what circumstances the prns are to be used.  Please see instructions for details which were reviewed in writing and the patient given a copy.    Late add: much better p xopenex 1.25 mg and ambulatory c minimal sob so d/c home for now /discussed with Dr Halford Chessman > admit prn as lives nearby

## 2014-04-06 NOTE — Assessment & Plan Note (Signed)
Strongly prefer in this setting: Bystolic, the most beta -1  selective Beta blocker available in sample form, with bisoprolol the most selective generic choice  on the market.   Will stop lopressor and start bisoprolol 2.5 mg daily with f/u next week by Dr Gwenlyn Found

## 2014-04-06 NOTE — Patient Instructions (Addendum)
Prednisone 10 mg take  4 each am x 2 days,   2 each am x 2 days,  1 each am x 2 days and stop   Stop lopressor and take bisoprolol 5 mg one half daily   symbicort 160 Take 2 puffs first thing in am and then another 2 puffs about 12 hours later.    Work on inhaler technique:  relax and gently blow all the way out then take a nice smooth deep breath back in, triggering the inhaler at same time you start breathing in.  Hold for up to 5 seconds if you can.  Rinse and gargle with water when done    Only use your albuterol (proair)  as a rescue medication to be used if you can't catch your breath by resting or doing a relaxed purse lip breathing pattern - The less you use it, the better it will work when you need it. - Ok to use up to 2 puffs  every 4 hours if you must but call for immediate appointment if use goes up over your usual need - Don't leave home without it !!  (think of it like the spare tire for your car)   Only use your nebulizer albuterol  if you try the proair and it doesn't relieve your breathing, ok to use up to every 4 hours if needed   Keep previous appointments

## 2014-04-06 NOTE — Progress Notes (Signed)
Subjective:    Patient ID: Travis Ferguson, male    DOB: 01-Jun-1941,   MRN: [id number]    Brief patient profile:  73 yo former smoker with COPD, mild OSA, and sarcoidosis.  03/23/2014 Follow up  Returns for 1 week follow  Feels breathing is much improved since last w/ the prednisone.  still very fatigued.   Seen by Cardiology last week, set up for a 2 D echo.  2D Echo yesterday showed EF 55-60%, grade 1 DD, PAP nml . BNP minimally elevated ~235  He is feeling much better, is tapering steroids . Cough is much better.  CXR today showed milid pulmonary vascular prominence and interstitial prominence .  On HCTZ daily 12.5mg  daily  rec Take Lasix 20mg  daily for 3 days  Finish prednisone taper as directed  Continue on Symbicort and Spiriva          04/06/2014 f/u ov/Jaking Thayer re: GOLD II COPD / former pipe smoker Chief Complaint  Patient presents with  . Acute Visit    Pt c/o cough and increased SOB x 2 days. He states that he is SOB with minimal exerion just as just walking from room to room at home. He had some left side chest tightness last night and states "feels like there is a belt around my chest".  His cough is prod with minimal yellow to white sputum.    much better p last course of prednisone then worse over the next week p completed it with congested cough/ chest tightness some better with proair, much better with neb despite symbicort 160 2bid (note very poor hfa)  No obvious other patterns in day to day or daytime variabilty or assoc pleuritic cp or overt sinus or hb symptoms. No unusual exp hx or h/o childhood pna/ asthma or knowledge of premature birth.  In general Sleeping ok without nocturnal  or early am exacerbation  of respiratory  c/o's or need for noct saba. Also denies any obvious fluctuation of symptoms with weather or environmental changes or other aggravating or alleviating factors except as outlined above   Current Medications, Allergies, Complete Past Medical  History, Past Surgical History, Family History, and Social History were reviewed in [employer] record.  ROS  The following are not active complaints unless bolded sore throat, dysphagia, dental problems, itching, sneezing,  nasal congestion or excess/ purulent secretions, ear ache,   fever, chills, sweats, unintended wt loss, pleuritic or exertional cp, hemoptysis,  orthopnea pnd or leg swelling, presyncope, palpitations, heartburn, abdominal pain, anorexia, nausea, vomiting, diarrhea  or change in bowel or urinary habits, change in stools or urine, dysuria,hematuria,  rash, arthralgias, visual complaints, headache, numbness weakness or ataxia or problems with walking or coordination,  change in mood/affect or memory.              TESTS: PSG May 2008>> AHI 8  Spirometry 11/18/08>>FEV1 2.15 (61%), FEV1% 62  Bronchoscopy 07/28/12 >> focal granulomatous inflammation Spirometry 03/14/14 >> FEV1 2.10 (60%), FEV1% 57   CHEST IMAGING: CT chest 05/19/12>>superior segment LLL infiltrate, b/l hilar and mediastinal LAN with calcification CT chest 07/13/12 >> atherosclerosis, Lt hilar adenopathy, LLL superior segment narrowing, mass like opacity superior segment LLL 5.4 x 1.9 cm, multiple nodular areas LLL, diffuse GGO LLL PET scan 07/22/12 >> mildly hypermetabolic mediastinal adenopathy (SUV 5.8), superior segment nodules hypermetabolic (SUV 81.8) CT chest 08/31/12 >> decreased size of lesions CT chest 12/02/12 >> borderline mediastinal/hilar LAN, nodular septal thickening b/l, no change superior  segment LLL, centrilobular emphysema CT chest 03/19/13 >> ?slight increase in LLL consolidative change, borderline LAN CT chest 08/24/13 >> 1.9 cm Rt paratracheal LAN, calcified 2.1 cm subcarinal LAN, narrowing of mainstem bronchi, chronic infiltrate LLL CT chest 03/16/14 >> atherosclerosis, no change in mediastinal and b/l hilar LAN, no change in LLL mass like opacity, thickening of  interstitium, scattered sub-pleural nodules, small Lt pleural effusion, gallstones         Objective:   Physical Exam GEN: A/Ox3; pleasant , sob p walking  50 ft from lobby/ gruff voice texture   Wt Readings from Last 3 Encounters:  03/23/14 271 lb (122.925 kg)  03/17/14 269 lb 1.9 oz (122.072 kg)  03/16/14 270 lb 6.4 oz (122.653 kg)      HEENT:  [place]/AT,  EACs-clear, TMs-wnl, NOSE-clear, THROAT-clear, no lesions, no postnasal drip or exudate noted.   NECK:  Supple w/ fair ROM; no JVD; normal carotid impulses w/o bruits; no thyromegaly or nodules palpated; no lymphadenopathy.  RESP  Insp and exp rhonchi/ wheezing with upper and lower airway features   CARD:  RRR, no m/r/g  , tr Bil peripheral edema, pulses intact, no cyanosis or clubbing.  GI:   Soft & nt; nml bowel sounds; no organomegaly or masses detected.  Musco: Warm bil, no deformities or joint swelling noted.   Neuro: alert, no focal deficits noted.    Skin: Warm, no lesions or rashes         Assessment & Plan:

## 2014-04-07 ENCOUNTER — Telehealth (HOSPITAL_COMMUNITY): Payer: Self-pay

## 2014-04-07 ENCOUNTER — Ambulatory Visit (INDEPENDENT_AMBULATORY_CARE_PROVIDER_SITE_OTHER): Payer: Medicare Other | Admitting: Adult Health

## 2014-04-07 ENCOUNTER — Encounter: Payer: Self-pay | Admitting: Adult Health

## 2014-04-07 ENCOUNTER — Ambulatory Visit: Payer: Medicare Other | Admitting: Adult Health

## 2014-04-07 ENCOUNTER — Ambulatory Visit (INDEPENDENT_AMBULATORY_CARE_PROVIDER_SITE_OTHER)
Admission: RE | Admit: 2014-04-07 | Discharge: 2014-04-07 | Disposition: A | Discharge status: Home / Self Care | Payer: Medicare Other | Source: Ambulatory Visit | Attending: Adult Health | Admitting: Adult Health

## 2014-04-07 VITALS — BP 102/72 | HR 93 | Temp 97.1°F | Ht 72.0 in | Wt 268.6 lb

## 2014-04-07 DIAGNOSIS — J449 Chronic obstructive pulmonary disease, unspecified: Secondary | ICD-10-CM

## 2014-04-07 DIAGNOSIS — D860 Sarcoidosis of lung: Secondary | ICD-10-CM

## 2014-04-07 MED ORDER — DOXYCYCLINE HYCLATE 100 MG PO TABS: 100.0000 mg | ORAL_TABLET | Freq: Two times a day (BID) | ORAL | Status: DC

## 2014-04-07 NOTE — Telephone Encounter (Signed)
Encounter complete. 

## 2014-04-07 NOTE — Telephone Encounter (Signed)
Documentation com[plete 

## 2014-04-07 NOTE — Patient Instructions (Signed)
Finish Prednisone taper as directed  Mucinex DM Twice daily  As needed  Cough/congestion  Begin Doxycycline 100mg  Twice daily for 7 days , take with food.  Follow up next week with Dr. Halford Chessman  As planned and As needed   Follow up with cardiology next week for stress test.  Please contact office for sooner follow up if symptoms do not improve or worsen or seek emergency care

## 2014-04-07 NOTE — Assessment & Plan Note (Signed)
Flare   Plan  Finish Prednisone taper as directed  Mucinex DM Twice daily  As needed  Cough/congestion  Follow up next week with Dr. Halford Chessman  As planned and As needed   Please contact office for sooner follow up if symptoms do not improve or worsen or seek emergency care

## 2014-04-07 NOTE — Progress Notes (Signed)
Subjective:    Patient ID: Travis Ferguson, male    DOB: February 01, 1941, 73 y.o.   MRN: [id number]  HPI  73 yo former smoker with COPD, mild OSA, and sarcoidosis.   04/07/2014 Acute OV  Pt returns for follow up for COPD /sarcoid flare .  Seen yesterday for COPD flare , tx w/ pred taper . Changed lopressor to bisoprolol.  He returns today feeling much better.  Has been having more cough, dyspnea and congestion over last 6 weeks . Initially tx w/ Avelox and steroids CXR showed  Chronic changes. Had CT chest on 03/16/14 that showed  no change in mediastinal and b/l hilar LAN, no change in LLL mass like opacity, thickening of interstitium, scattered sub-pleural nodules, small Lt pleural effusion,.  Underwent  2D Echo EF 55-60%, grade 1 DD, PAP nml . BNP minimally elevated ~235 Has stress test set up for next week with cardiology  He says she is feeling so much better today . Did see some streaks of blood in mucus yesterday. No fever or chest pain.  No calf pain or orthopena.    TESTS: PSG May 2008>> AHI 8  Spirometry 11/18/08>>FEV1 2.15 (61%), FEV1% 62  Bronchoscopy 07/28/12 >> focal granulomatous inflammation Spirometry 03/14/14 >> FEV1 2.10 (60%), FEV1% 57   CHEST IMAGING: CT chest 05/19/12>>superior segment LLL infiltrate, b/l hilar and mediastinal LAN with calcification CT chest 07/13/12 >> atherosclerosis, Lt hilar adenopathy, LLL superior segment narrowing, mass like opacity superior segment LLL 5.4 x 1.9 cm, multiple nodular areas LLL, diffuse GGO LLL PET scan 07/22/12 >> mildly hypermetabolic mediastinal adenopathy (SUV 5.8), superior segment nodules hypermetabolic (SUV 36.6) CT chest 08/31/12 >> decreased size of lesions CT chest 12/02/12 >> borderline mediastinal/hilar LAN, nodular septal thickening b/l, no change superior segment LLL, centrilobular emphysema CT chest 03/19/13 >> ?slight increase in LLL consolidative change, borderline LAN CT chest 08/24/13 >> 1.9 cm Rt paratracheal LAN,  calcified 2.1 cm subcarinal LAN, narrowing of mainstem bronchi, chronic infiltrate LLL CT chest 03/16/14 >> atherosclerosis, no change in mediastinal and b/l hilar LAN, no change in LLL mass like opacity, thickening of interstitium, scattered sub-pleural nodules, small Lt pleural effusion, gallstones       Review of Systems  Constitutional:   No  weight loss, night sweats,  Fevers, chills,  +fatigue, or  lassitude.  HEENT:   No headaches,  Difficulty swallowing,  Tooth/dental problems, or  Sore throat,                No sneezing, itching, ear ache, nasal congestion, post nasal drip,   CV:  No chest pain,  Orthopnea, PND, swelling in lower extremities, anasarca, dizziness, palpitations, syncope.   GI  No heartburn, indigestion, abdominal pain, nausea, vomiting, diarrhea, change in bowel habits, loss of appetite, bloody stools.   Resp:  .  No chest wall deformity  Skin: no rash or lesions.  GU: no dysuria, change in color of urine, no urgency or frequency.  No flank pain, no hematuria   MS:  No joint pain or swelling.  No decreased range of motion.  +chronic back pain.  Psych:  No change in mood or affect. No depression or anxiety.  No memory loss.         Objective:   Physical Exam  GEN: A/Ox3; pleasant , NAD, obese   HEENT:  [place]/AT,  EACs-clear, TMs-wnl, NOSE-clear, THROAT-clear, no lesions, no postnasal drip or exudate noted.   NECK:  Supple w/ fair ROM; no JVD;  normal carotid impulses w/o bruits; no thyromegaly or nodules palpated; no lymphadenopathy.  RESP  Decreased BS  w/o, wheezes/ rales/ or rhonchi.no accessory muscle use, no dullness to percussion  CARD:  RRR, no m/r/g  , tr  peripheral edema, pulses intact, no cyanosis or clubbing.  GI:   Soft & nt; nml bowel sounds; no organomegaly or masses detected.  Musco: Warm bil, no deformities or joint swelling noted.   Neuro: alert, no focal deficits noted.    Skin: Warm, no lesions or rashes  CXR 04/07/2014  chronic changes , no acute process noted.        Assessment & Plan:

## 2014-04-07 NOTE — Assessment & Plan Note (Signed)
Recurrent flare +/- Sarcoid flare   Plan  Finish Prednisone taper as directed  Mucinex DM Twice daily  As needed  Cough/congestion  Begin Doxycycline 100mg  Twice daily for 7 days , take with food.  Follow up next week with Dr. Halford Chessman  As planned and As needed   Follow up with cardiology next week for stress test.  Please contact office for sooner follow up if symptoms do not improve or worsen or seek emergency care

## 2014-04-07 NOTE — Progress Notes (Signed)
Reviewed and agree with assessment/plan. 

## 2014-04-12 ENCOUNTER — Encounter: Payer: Self-pay | Admitting: Physician Assistant

## 2014-04-12 ENCOUNTER — Encounter (HOSPITAL_COMMUNITY): Payer: Medicare Other

## 2014-04-12 ENCOUNTER — Ambulatory Visit (INDEPENDENT_AMBULATORY_CARE_PROVIDER_SITE_OTHER): Payer: Medicare Other | Admitting: Physician Assistant

## 2014-04-12 VITALS — BP 127/82 | HR 98 | Ht 72.0 in | Wt 267.9 lb

## 2014-04-12 DIAGNOSIS — I2510 Atherosclerotic heart disease of native coronary artery without angina pectoris: Secondary | ICD-10-CM

## 2014-04-12 DIAGNOSIS — I5032 Chronic diastolic (congestive) heart failure: Secondary | ICD-10-CM

## 2014-04-12 DIAGNOSIS — D860 Sarcoidosis of lung: Secondary | ICD-10-CM

## 2014-04-12 DIAGNOSIS — G4733 Obstructive sleep apnea (adult) (pediatric): Secondary | ICD-10-CM

## 2014-04-12 DIAGNOSIS — I10 Essential (primary) hypertension: Secondary | ICD-10-CM

## 2014-04-12 DIAGNOSIS — E785 Hyperlipidemia, unspecified: Secondary | ICD-10-CM

## 2014-04-12 NOTE — Assessment & Plan Note (Signed)
Patient's chest pain seems related to his lungs. He does describe it as tightness however, it resolves after use of Symbicort. As discussed by Sharrell Ku, previous CT scan shows atherosclerosis in a 3 vessel pattern.  His recent echocardiogram showed normal LV function and grade 1 diastolic dysfunction.  Patient is scheduled for a Lexi scan next week.

## 2014-04-12 NOTE — Assessment & Plan Note (Signed)
Noncompliant with CPAP. We discussed this in some detail.

## 2014-04-12 NOTE — Progress Notes (Signed)
Date:  04/12/2014   ID:  Travis Ferguson, DOB [birth year], MRN [id number]  PCP:  Jerlyn Ly, MD  Primary Cardiologist:  Gwenlyn Found     History of Present Illness: Travis Ferguson is a 73 y.o. male moderately overweight married Caucasian male, father of 90, grandfather to 1 grandchild, who is a retired Catering manager. He was referred through the courtesy of Dr. Joylene Draft for cardiovascular evaluation because of chronic dyspnea and exertional chest pressure in November 2014.   His cardiac risk factor profile is remarkable for remote discontinued tobacco abuse back in 1996. He smoked a pipe at that time. He has treated hypertension and hyperlipidemia, sarcoidosis, COPD, occupational exposure, subdural hematoma 2010 (several weeks after a fall), OSA (noncompliant with CPAP) and coronary artery calcification on CT recently. He does have a family history of heart disease with a brother who had bypass surgery in his mid 64s. He has never had a heart attack or stroke. He has been worked up by Dr. Halford Chessman for question of lung CA, which has never been clinically documented.   The patient had a 2-D echocardiogram and Myoview stress test on 09/2012 which showed normal LV function and no evidence of ischemia.  His most recent 2-D echocardiogram was last month and the ejection fraction is 55-60% with grade 1 diastolic dysfunction. He has had back surgery onL4l/L5. 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   Patient was recently seen by Sharrell Ku, PA-C and Dr. Halford Chessman for worsening shortness of breath. He has been treated with prednisone and ABX and has improved considerably since then.  He reports periodic chest tightness which improved after taking Symbicort. He states the chest tightness does not occur with exertion it feels like it has the last several years.  He otherwise denies nausea, vomiting, fever, chest pain, shortness of breath, orthopnea,  dizziness, PND, cough, congestion, abdominal pain, hematochezia, melena, lower extremity edema, claudication.  Wt Readings from Last 3 Encounters:  04/12/14 267 lb 14.4 oz (121.519 kg)  04/07/14 268 lb 9.6 oz (121.836 kg)  03/23/14 271 lb (122.925 kg)     Past Medical History  Diagnosis Date  . 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  . Depression   . PTSD (post-traumatic stress disorder)   . OSA (obstructive sleep apnea)   . Migraine   . GERD (gastroesophageal reflux disease)   . Dyslipidemia   . Rhinitis   . Cervical disc disease   . Subdural hematoma 11/10    a. 2010 s/p surgery - diagnosed several weeks after a fall.  . Hypertension   . Coronary artery calcification seen on CAT scan   . Sarcoidosis   . Atherosclerosis   . Dyspnea     chronic    Current Outpatient Prescriptions  Medication Sig Dispense Refill  . albuterol (PROVENTIL) (2.5 MG/3ML) 0.083% nebulizer solution Take 2.5 mg by nebulization every 6 (six) hours as needed for wheezing or shortness of breath.      . bisoprolol (ZEBETA) 5 MG tablet One half daily  15 tablet  11  . budesonide-formoterol (SYMBICORT) 160-4.5 MCG/ACT inhaler Inhale 2 puffs into the lungs 2 (two) times daily.  2 Inhaler  0  . doxycycline (VIBRA-TABS) 100 MG tablet Take 1 tablet (100 mg total) by mouth 2 (two) times daily.  14 tablet  0  . finasteride (PROSCAR)  5 MG tablet Take 1 tablet by mouth daily.      [name] FLUoxetine (PROZAC) 40 MG capsule Take 40 mg by mouth daily.        [name] gabapentin (NEURONTIN) 300 MG capsule 2 capsules by mouth three times daily      . HYDROcodone-acetaminophen (NORCO/VICODIN) 5-325 MG per tablet Take 2 tablets by mouth every 6 (six) hours as needed.       [name] losartan-hydrochlorothiazide (HYZAAR) 50-12.5 MG per tablet Take 0.5 tablets by mouth daily.       . metoprolol tartrate (LOPRESSOR) 25 MG tablet Take 25 mg by mouth daily.       . predniSONE (DELTASONE) 10 MG  tablet Take  4 each am x 2 days,   2 each am x 2 days,  1 each am x 2 days and stop  14 tablet  0  . PROAIR HFA 108 (90 BASE) MCG/ACT inhaler 1-2 PUFFS EVERY 4-6 HOURS AS NEEDED  8.5 each  1  . tiotropium (SPIRIVA) 18 MCG inhalation capsule Place 1 capsule (18 mcg total) into inhaler and inhale daily.  90 capsule  3  . furosemide (LASIX) 20 MG tablet Take 1 tablet by mouth daily.      . simvastatin (ZOCOR) 20 MG tablet Take 1 tablet by mouth daily.       No current facility-administered medications for this visit.    Allergies:    Allergies  Allergen Reactions  . Statins Other (See Comments)    Muscle aches - tolerating Vytorin    Social History:  The patient  reports that he quit smoking about 18 years ago. His smoking use included Pipe. He has never used smokeless tobacco. He reports that he does not drink alcohol or use illicit drugs.   Family history:   Family History  Problem Relation Age of Onset  . Coronary artery disease    . Colon cancer Neg Hx   . Pancreatic cancer Neg Hx   . Rectal cancer Neg Hx   . Stomach cancer Paternal Grandmother     ROS:  Please see the history of present illness.  All other systems reviewed and negative.   PHYSICAL EXAM: VS:  BP 127/82  Pulse 98  Ht 6' (1.829 m)  Wt 267 lb 14.4 oz (121.519 kg)  BMI 36.33 kg/m2 Obese, well developed, in no acute distress HEENT: Pupils are equal round react to light accommodation extraocular movements are intact.  Neck: no JVD Cardiac: Regular rate and rhythm without murmurs rubs or gallops. Lungs:  clear to auscultation bilaterally, no wheezing, rhonchi or rales Abd: soft, nontender, positive bowel sounds all quadrants, no hepatosplenomegaly Ext: Trace lower extremity edema.  2+ radial and dorsalis pedis pulses. Skin: warm and dry Neuro:  Grossly normal  EKG:  none   ASSESSMENT AND PLAN:  Problem List Items Addressed This Visit   Coronary artery calcification seen on CAT scan - Primary      Patient's chest pain seems related to his lungs. He does describe it as tightness however, it resolves after use of Symbicort. As discussed by Sharrell Ku, previous CT scan shows atherosclerosis in a 3 vessel pattern.  His recent echocardiogram showed normal LV function and grade 1 diastolic dysfunction.  Patient is scheduled for a Lexi scan next week.    Relevant Medications      furosemide (LASIX) 20 MG tablet      simvastatin (ZOCOR) 20 MG tablet   Diastolic CHF     Patient  appears euvolemic.    Relevant Medications      furosemide (LASIX) 20 MG tablet      simvastatin (ZOCOR) 20 MG tablet   Essential hypertension     Pressure well controlled.    Relevant Medications      furosemide (LASIX) 20 MG tablet      simvastatin (ZOCOR) 20 MG tablet   Hyperlipidemia     Allergies statin    Relevant Medications      furosemide (LASIX) 20 MG tablet      simvastatin (ZOCOR) 20 MG tablet   Obstructive sleep apnea     Noncompliant with CPAP. We discussed this in some detail.    Pulmonary sarcoidosis

## 2014-04-12 NOTE — Assessment & Plan Note (Signed)
Allergies statin

## 2014-04-12 NOTE — Patient Instructions (Signed)
Your physician recommends that you schedule a follow-up appointment with Dr. Gwenlyn Found as scheduled. No changes were made today in your therapy.

## 2014-04-12 NOTE — Assessment & Plan Note (Signed)
Pressure well controlled.

## 2014-04-12 NOTE — Assessment & Plan Note (Signed)
Patient appears euvolemic.

## 2014-04-14 ENCOUNTER — Encounter: Payer: Self-pay | Admitting: Pulmonary Disease

## 2014-04-14 ENCOUNTER — Ambulatory Visit (INDEPENDENT_AMBULATORY_CARE_PROVIDER_SITE_OTHER): Payer: Medicare Other | Admitting: Pulmonary Disease

## 2014-04-14 VITALS — BP 106/62 | HR 91 | Ht 72.0 in | Wt 274.0 lb

## 2014-04-14 DIAGNOSIS — J948 Other specified pleural conditions: Secondary | ICD-10-CM

## 2014-04-14 DIAGNOSIS — J449 Chronic obstructive pulmonary disease, unspecified: Secondary | ICD-10-CM

## 2014-04-14 DIAGNOSIS — J4489 Other specified chronic obstructive pulmonary disease: Secondary | ICD-10-CM

## 2014-04-14 DIAGNOSIS — Z1211 Encounter for screening for malignant neoplasm of colon: Secondary | ICD-10-CM

## 2014-04-14 DIAGNOSIS — D860 Sarcoidosis of lung: Secondary | ICD-10-CM

## 2014-04-14 DIAGNOSIS — J90 Pleural effusion, not elsewhere classified: Secondary | ICD-10-CM

## 2014-04-14 DIAGNOSIS — M545 Low back pain: Secondary | ICD-10-CM

## 2014-04-14 DIAGNOSIS — I2510 Atherosclerotic heart disease of native coronary artery without angina pectoris: Secondary | ICD-10-CM

## 2014-04-14 NOTE — Assessment & Plan Note (Signed)
His pulmonary status is improved to the point that he can have back procedure.  Advised him to wait until stress test is done with cardiology before rescheduling back procedure.

## 2014-04-14 NOTE — Assessment & Plan Note (Signed)
His pulmonary status is improved to the point that he can have colonoscopy.  Advised him to wait until stress test is done with cardiology before rescheduling colonoscopy.

## 2014-04-14 NOTE — Progress Notes (Signed)
Chief Complaint  Patient presents with  . Follow-up    Feeling better with sob,cough-yellow x 2-3 days,occass. wheezing,tired,has appt. Dr. Alvester Chou in Nov.,having a lot of back pain    History of Present Illness: Travis Ferguson is a 73 y.o. male former smoker with COPD, mild OSA, and sarcoidosis.  Since I saw him last he has been treated with several course of prednisone for AECOPD.  His breathing has improved.  He has been off prednisone for several days.  He feels that better technique with inhalers has helped control his COPD better also.   He had recent cardiac evaluation, and is schedule for stress test next week.  His back pain is worse off of prednisone.  He needs to have f/u procedure with pain specialist.  He is also due to have colonoscopy when he is medically stable for procedure.   TESTS: PSG May 2008>> AHI 8  Spirometry 11/18/08>>FEV1 2.15 (61%), FEV1% 62  Bronchoscopy 07/28/12 >> focal granulomatous inflammation Spirometry 03/14/14 >> FEV1 2.10 (60%), FEV1% 57 Echo 03/22/14 >> EF 55 to 56%, grade 1 diastolic dysfx   CHEST IMAGING: CT chest 05/19/12>>superior segment LLL infiltrate, b/l hilar and mediastinal LAN with calcification CT chest 07/13/12 >> atherosclerosis, Lt hilar adenopathy, LLL superior segment narrowing, mass like opacity superior segment LLL 5.4 x 1.9 cm, multiple nodular areas LLL, diffuse GGO LLL PET scan 07/22/12 >> mildly hypermetabolic mediastinal adenopathy (SUV 5.8), superior segment nodules hypermetabolic (SUV 38.7) CT chest 08/31/12 >> decreased size of lesions CT chest 12/02/12 >> borderline mediastinal/hilar LAN, nodular septal thickening b/l, no change superior segment LLL, centrilobular emphysema CT chest 03/19/13 >> ?slight increase in LLL consolidative change, borderline LAN CT chest 08/24/13 >> 1.9 cm Rt paratracheal LAN, calcified 2.1 cm subcarinal LAN, narrowing of mainstem bronchi, chronic infiltrate LLL CT chest 03/16/14 >> atherosclerosis, no  change in mediastinal and b/l hilar LAN, no change in LLL mass like opacity, thickening of interstitium, scattered sub-pleural nodules, small Lt pleural effusion, gallstones   PMHx, PSHx, Medications, Allergies, Fhx, Shx reviewed.  Physical Exam:  General - No distress ENT - no sinus tenderness, no oral exudate, no LAN Cardiac - s1s2 regular, no murmur Chest - prolonged exhalation, no wheeze/rales Back - No focal tenderness Abd - Soft, non-tender Ext - No edema Neuro - Normal strength Skin - No rashes Psych - normal mood, and behavior   Assessment/Plan:  Chesley Mires, MD [facility] Pager:  [phone] 04/14/2014, 11:31 AM

## 2014-04-14 NOTE — Assessment & Plan Note (Signed)
This was seen on CT chest.  Not as evident on most recent CXR.  Monitor clinically.

## 2014-04-14 NOTE — Assessment & Plan Note (Addendum)
He has improved from recent exacerbation.  He is to continue symbicort, spiriva, and prn albuterol.

## 2014-04-14 NOTE — Assessment & Plan Note (Signed)
I don't think his recent symptoms were related to flare of sarcoidosis.  Will monitor him off prednisone.

## 2014-04-14 NOTE — Assessment & Plan Note (Signed)
He is to have stress test with cardiology next week.

## 2014-04-14 NOTE — Patient Instructions (Signed)
Follow up in 2 months

## 2014-04-15 ENCOUNTER — Telehealth (HOSPITAL_COMMUNITY): Payer: Self-pay

## 2014-04-15 NOTE — Telephone Encounter (Signed)
Encounter complete. 

## 2014-04-20 ENCOUNTER — Ambulatory Visit (HOSPITAL_COMMUNITY)
Admission: RE | Admit: 2014-04-20 | Discharge: 2014-04-20 | Disposition: A | Discharge status: Home / Self Care | Payer: Medicare Other | Source: Ambulatory Visit | Attending: Cardiovascular Disease | Admitting: Cardiovascular Disease

## 2014-04-20 DIAGNOSIS — R0602 Shortness of breath: Secondary | ICD-10-CM

## 2014-04-20 DIAGNOSIS — R5383 Other fatigue: Secondary | ICD-10-CM | POA: Insufficient documentation

## 2014-04-20 DIAGNOSIS — R0609 Other forms of dyspnea: Secondary | ICD-10-CM | POA: Diagnosis present

## 2014-04-20 DIAGNOSIS — Z8249 Family history of ischemic heart disease and other diseases of the circulatory system: Secondary | ICD-10-CM | POA: Diagnosis not present

## 2014-04-20 DIAGNOSIS — E669 Obesity, unspecified: Secondary | ICD-10-CM | POA: Diagnosis not present

## 2014-04-20 DIAGNOSIS — I10 Essential (primary) hypertension: Secondary | ICD-10-CM | POA: Diagnosis not present

## 2014-04-20 DIAGNOSIS — E785 Hyperlipidemia, unspecified: Secondary | ICD-10-CM | POA: Insufficient documentation

## 2014-04-20 MED ORDER — TECHNETIUM TC 99M SESTAMIBI GENERIC - CARDIOLITE
30.0000 | Freq: Once | INTRAVENOUS | Status: AC | PRN
  Administered 2014-04-20: 30 via INTRAVENOUS

## 2014-04-20 MED ORDER — DOBUTAMINE INFUSION FOR EP/ECHO/NUC (1000 MCG/ML)
0.0000 ug/kg/min | Freq: Once | INTRAVENOUS | Status: AC
  Administered 2014-04-20: 20 ug/kg/min via INTRAVENOUS

## 2014-04-20 MED ORDER — TECHNETIUM TC 99M SESTAMIBI GENERIC - CARDIOLITE
10.0000 | Freq: Once | INTRAVENOUS | Status: AC | PRN
  Administered 2014-04-20: 10 via INTRAVENOUS

## 2014-04-20 NOTE — Procedures (Addendum)
[facility] [address] [place] [address] [phone]  Cardiology Nuclear Med Study  SYE SCHROEPFER is a 73 y.o. male     MRN : [id number]     DOB: 07-24-1940  Procedure Date: 04/20/2014  Nuclear Med Background Indication for Stress Test:  Evaluation for Ischemia History:  COPD and Coronary calcification per CT;Last NUC MPI on 10/08/2013-nonischemic;EF-not gated. Cardiac Risk Factors: Family History - CAD, History of Smoking, Hypertension, Lipids and Obesity  Symptoms:  DOE and Fatigue   Nuclear Pre-Procedure Caffeine/Decaff Intake:  7:00pm NPO After: 5:00am   IV Site: R Wrist  IV 0.9% NS with Angio Cath:  22g  Chest Size (in):  52"  IV Started by: Rolene Course, RN  Height: 5\' 11"  (1.803 m)  Cup Size: n/a  BMI:  Body mass index is 37.53 kg/(m^2). Weight:  269 lb (122.018 kg)   Tech Comments:  n/a    Nuclear Med Study 1 or 2 day study: 1 day  Stress Test Type:  Dobutamine  Order Authorizing Provider:  Quay Burow, MD   Resting Radionuclide: Technetium 26m Sestamibi  Resting Radionuclide Dose: 10.5 mCi   Stress Radionuclide:  Technetium 78m Sestamibi  Stress Radionuclide Dose: 30.1 mCi           Stress Protocol Rest HR:102 Stress HR: 142  Rest BP: 127/98 Stress BP:144/89  Exercise Time (min): n/a METS: n/a          Dose of Adenosine (mg):  n/a Dose of Lexiscan: n/a mg  Dose of Atropine (mg): n/a Dose of Dobutamine: 20 mcg/kg/min (at max HR)  Stress Test Technologist: Mellody Memos, CCT Nuclear Technologist: Otho Perl, CNMT   Rest Procedure:  Myocardial perfusion imaging was performed at rest 45 minutes following the intravenous administration of Technetium 25m Sestamibi. Stress Procedure:  The patient received IV dobutamine and no IV atropine.  There were no significant changes with infusion.  Technetium 73m Sestamibi was injected IV at peak heart rate and quantitative spect images were obtained  after a 45 minute delay.  Transient Ischemic Dilatation (Normal <1.22):  1.17 LV Ejection Fraction: Study not gated        Rest ECG: Sinus tachycardia with no ST changes.  Stress ECG: No significant ST segment change suggestive of ischemia; frequent PVCs, occasional couplet and 4 beats NSVT.  QPS Raw Data Images:  Acquisition technically good; normal left ventricular size. Stress Images:  There is decreased uptake in the inferior wall. Rest Images:  There is decreased uptake in the inferior wall. Subtraction (SDS):  No evidence of ischemia.  Impression Exercise Capacity:  Dobutamine study with no exercise. BP Response:  Normal blood pressure response. Clinical Symptoms:  No chest pain or dyspnea. ECG Impression:  No significant ST segment change suggestive of ischemia. Comparison with Prior Nuclear Study: Compared to report of 10/08/12, inferior defect is old (images not available).  Overall Impression:  Low risk stress nuclear study with a moderate size, medium intensity, fixed inferior defect consistent with prior infarct vs diaphragmatic attenuation; no ischemia.  LV Wall Motion:  Study not gated.   Kirk Ruths, MD  04/20/2014 1:17 PM

## 2014-04-22 ENCOUNTER — Telehealth: Payer: Self-pay | Admitting: *Deleted

## 2014-04-22 NOTE — Telephone Encounter (Signed)
Message copied by Chauncy Lean on Fri Apr 22, 2014  5:23 PM ------      Message from: Lorretta Harp      Created: Fri Apr 22, 2014 11:29 AM       Low risk myoview with inferior scar vs diaphragmatic attenuation ------

## 2014-04-27 ENCOUNTER — Ambulatory Visit: Payer: Medicare Other | Admitting: Pulmonary Disease

## 2014-05-05 ENCOUNTER — Ambulatory Visit (INDEPENDENT_AMBULATORY_CARE_PROVIDER_SITE_OTHER): Payer: Medicare Other | Admitting: Adult Health

## 2014-05-05 ENCOUNTER — Encounter: Payer: Self-pay | Admitting: Adult Health

## 2014-05-05 ENCOUNTER — Ambulatory Visit: Payer: Medicare Other | Admitting: Adult Health

## 2014-05-05 VITALS — BP 104/74 | HR 93 | Temp 97.1°F | Ht 72.0 in | Wt 270.0 lb

## 2014-05-05 DIAGNOSIS — J449 Chronic obstructive pulmonary disease, unspecified: Secondary | ICD-10-CM

## 2014-05-05 NOTE — Progress Notes (Signed)
Subjective:    Patient ID: Travis Ferguson, male    DOB: May 07, 1941, 73 y.o.   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  PSG May 2008>> AHI 8  Spirometry 11/18/08>>FEV1 2.15 (61%), FEV1% 62  Bronchoscopy 07/28/12 >> focal granulomatous inflammation Spirometry 03/14/14 >> FEV1 2.10 (60%), FEV1% 57 Echo 03/22/14 >> EF 55 to 69%, grade 1 diastolic dysfx   CHEST IMAGING: CT chest 05/19/12>>superior segment LLL infiltrate, b/l hilar and mediastinal LAN with calcification CT chest 07/13/12 >> atherosclerosis, Lt hilar adenopathy, LLL superior segment narrowing, mass like opacity superior segment LLL 5.4 x 1.9 cm, multiple nodular areas LLL, diffuse GGO LLL PET scan 07/22/12 >> mildly hypermetabolic mediastinal adenopathy (SUV 5.8), superior segment nodules hypermetabolic (SUV 48.5) CT chest 08/31/12 >> decreased size of lesions CT chest 12/02/12 >> borderline mediastinal/hilar LAN, nodular septal thickening b/l, no change superior segment LLL, centrilobular emphysema CT chest 03/19/13 >> ?slight increase in LLL consolidative change, borderline LAN CT chest 08/24/13 >> 1.9 cm Rt paratracheal LAN, calcified 2.1 cm subcarinal LAN, narrowing of mainstem bronchi, chronic infiltrate LLL CT chest 03/16/14 >> atherosclerosis, no change in mediastinal and b/l hilar LAN, no change in LLL mass like opacity, thickening of interstitium, scattered sub-pleural nodules, small Lt pleural effusion, gallstones PSG May 2008>> AHI 8  Spirometry 11/18/08>>FEV1 2.15 (61%), FEV1% 62  Bronchoscopy 07/28/12 >> focal granulomatous inflammation Spirometry 03/14/14 >> FEV1 2.10 (60%), FEV1% 57 Echo 03/22/14 >> EF 55 to 46%, grade 1 diastolic dysfx   CHEST IMAGING: CT chest 05/19/12>>superior segment LLL infiltrate, b/l hilar and mediastinal LAN with calcification CT chest 07/13/12 >> atherosclerosis, Lt hilar adenopathy, LLL superior segment narrowing, mass like opacity superior segment LLL 5.4 x 1.9 cm,  multiple nodular areas LLL, diffuse GGO LLL PET scan 07/22/12 >> mildly hypermetabolic mediastinal adenopathy (SUV 5.8), superior segment nodules hypermetabolic (SUV 27.0) CT chest 08/31/12 >> decreased size of lesions CT chest 12/02/12 >> borderline mediastinal/hilar LAN, nodular septal thickening b/l, no change superior segment LLL, centrilobular emphysema CT chest 03/19/13 >> ?slight increase in LLL consolidative change, borderline LAN CT chest 08/24/13 >> 1.9 cm Rt paratracheal LAN, calcified 2.1 cm subcarinal LAN, narrowing of mainstem bronchi, chronic infiltrate LLL CT chest 03/16/14 >> atherosclerosis, no change in mediastinal and b/l hilar LAN, no change in LLL mass like opacity, thickening of interstitium, scattered sub-pleural nodules, small Lt pleural effusion, gallstones   05/05/2014 Acute OV  Presents for an acute office visit.  Complains of  prod cough with white mucus, wheezing, increased for 1 week.  Denies f/c/s, n/v/d, hemoptysis Cough is worse at night, keeping him up at night. Not sleeping Cough is making his ribs sore, has severe coughing fits.  Had stress test 2 weeks ago that was neg for ischemia.  CXR last month with no acute process.  Had similar flare 1 month ago, tx w/ steroid taper with resolved symptoms.  Had echo last month , EF 55%, GR 1 DD .  Had chronic back pain w/ spinal stenosis , going to pain clinic .  On neurontin 600mg  Three times a day  And vicodin Four times a day   Remains on symbicort and spiriva .   Review of Systems Constitutional:   No  weight loss, night sweats,  Fevers, chills, fatigue, or  lassitude.  HEENT:   No headaches,  Difficulty swallowing,  Tooth/dental problems, or  Sore throat,  No sneezing, itching, ear ache,  +nasal congestion, post nasal drip,   CV:  No chest pain,  Orthopnea, PND, swelling in lower extremities, anasarca, dizziness, palpitations, syncope.   GI  No heartburn, indigestion, abdominal pain, nausea,  vomiting, diarrhea, change in bowel habits, loss of appetite, bloody stools.   Resp:   No chest wall deformity  Skin: no rash or lesions.  GU: no dysuria, change in color of urine, no urgency or frequency.  No flank pain, no hematuria   MS:  No joint pain or swelling.  No decreased range of motion.  No back pain.  Psych:  No change in mood or affect. No depression or anxiety.  No memory loss.         Objective:   Physical Exam GEN: A/Ox3; pleasant , NAD, overweight   HEENT:  [place]/AT,  EACs-clear, TMs-wnl, NOSE-clear drainage  THROAT-clear, no lesions, no postnasal drip or exudate noted.   NECK:  Supple w/ fair ROM; no JVD; normal carotid impulses w/o bruits; no thyromegaly or nodules palpated; no lymphadenopathy.  RESP  Decreased BS in bases .no accessory muscle use, no dullness to percussion  CARD:  RRR, no m/r/g  , no peripheral edema, pulses intact, no cyanosis or clubbing.  GI:   Soft & nt; nml bowel sounds; no organomegaly or masses detected.  Musco: Warm bil, no deformities or joint swelling noted.   Neuro: alert, no focal deficits noted.    Skin: Warm, no lesions or rashes        Assessment & Plan:

## 2014-05-05 NOTE — Progress Notes (Signed)
Reviewed and agree with assessment/plan. 

## 2014-05-05 NOTE — Patient Instructions (Addendum)
Mucinex Twice daily  As needed  Cough/congestion  Deslym 2 tsp Twice daily  As needed  Cough  Begin Chlortrimeton 4mg  2 At bedtime  For drainage.  Follow up Dr. Halford Chessman  In 6 weeks and As needed   Please contact office for sooner follow up if symptoms do not improve or worsen or seek emergency care

## 2014-05-05 NOTE — Assessment & Plan Note (Addendum)
Flare with AR .  Hold on steroids for now.  Treat for cough triggers   Plan  Mucinex Twice daily  As needed  Cough/congestion  Deslym 2 tsp Twice daily  As needed  Cough  Begin Chlortrimeton 4mg  2 At bedtime  For drainage.  Follow up Dr. Halford Chessman  In 6 weeks and As needed   Please contact office for sooner follow up if symptoms do not improve or worsen or seek emergency care

## 2014-05-11 ENCOUNTER — Encounter: Payer: Self-pay | Admitting: Cardiovascular Disease

## 2014-05-11 ENCOUNTER — Ambulatory Visit (INDEPENDENT_AMBULATORY_CARE_PROVIDER_SITE_OTHER): Payer: Medicare Other | Admitting: Cardiovascular Disease

## 2014-05-11 VITALS — BP 104/72 | HR 97 | Ht 72.0 in | Wt 263.1 lb

## 2014-05-11 DIAGNOSIS — I2510 Atherosclerotic heart disease of native coronary artery without angina pectoris: Secondary | ICD-10-CM

## 2014-05-11 DIAGNOSIS — E785 Hyperlipidemia, unspecified: Secondary | ICD-10-CM

## 2014-05-11 DIAGNOSIS — I5032 Chronic diastolic (congestive) heart failure: Secondary | ICD-10-CM

## 2014-05-11 DIAGNOSIS — I10 Essential (primary) hypertension: Secondary | ICD-10-CM

## 2014-05-11 NOTE — Progress Notes (Signed)
05/11/2014 Travis Ferguson   [birth year]  [id number]  Primary Physician Jerlyn Ly, MD Primary Cardiologist: Lorretta Harp MD Renae Gloss   HPI:  The patient is a very pleasant 73 year old moderately overweight married Caucasian male, father of 79, grandfather to 1 grandchild, who is a retired Catering manager. He is referred through the courtesy of Dr. Joylene Draft for cardiovascular evaluation because of chronic dyspnea and exertional chest pressure. I last saw him in the office in 12 months ago.  His cardiac risk factor profile is remarkable for remote discontinued tobacco abuse back in 1996. He smoked a pipe at that time. He has treated hypertension and hyperlipidemia. He does have a family history of heart disease with a brother who had bypass surgery in his mid 24s. He has never had a heart attack or stroke. His past surgical history is remarkable for subdural hematoma and brain surgery in the past. He has been worked up by Dr. Halford Chessman for question of lung CA, which has never been clinically documented. He did have a chest CT that showed, among other things, plaque and atherosclerosis in his coronary arteries.   The patient recently had a 2-D echocardiogram and Myoview stress test which showed normal left for function and no evidence of ischemia. He did have grade 1 diastolic dysfunction. He denies chest pain. 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  Since I saw him back one year ago he has a recent diagnosis of sarcoidosis.   Current Outpatient Prescriptions  Medication Sig Dispense Refill  . albuterol (PROVENTIL) (2.5 MG/3ML) 0.083% nebulizer solution Take 2.5 mg by nebulization every 6 (six) hours as needed for wheezing or shortness of breath.    . budesonide-formoterol (SYMBICORT) 160-4.5 MCG/ACT inhaler Inhale 2 puffs into  the lungs 2 (two) times daily. 2 Inhaler 0  . finasteride (PROSCAR) 5 MG tablet Take 1 tablet by mouth daily.    [name] FLUoxetine (PROZAC) 40 MG capsule Take 40 mg by mouth daily.      [name] gabapentin (NEURONTIN) 300 MG capsule 2 capsules by mouth three times daily    . HYDROcodone-acetaminophen (NORCO/VICODIN) 5-325 MG per tablet Take 2 tablets by mouth every 6 (six) hours as needed.     [name] losartan-hydrochlorothiazide (HYZAAR) 50-12.5 MG per tablet Take 0.5 tablets by mouth daily.     . metoprolol tartrate (LOPRESSOR) 25 MG tablet Take 25 mg by mouth daily.     [name] PROAIR HFA 108 (90 BASE) MCG/ACT inhaler 1-2 PUFFS EVERY 4-6 HOURS AS NEEDED 8.5 each 1  . simvastatin (ZOCOR) 20 MG tablet Take 1 tablet by mouth daily.    [name] tiotropium (SPIRIVA) 18 MCG inhalation capsule Place 1 capsule (18 mcg total) into inhaler and inhale daily. 90 capsule 3   No current facility-administered medications for this visit.    Allergies  Allergen Reactions  . Statins Other (See Comments)    Muscle aches - tolerating Vytorin    History   Social History  . Marital Status: Married    Spouse Name: N/A    Number of Children: N/A  . Years of Education: N/A   Occupational History  . retired Airline pilot in [place]  . Smoking status: Former Smoker -- 0.00 packs/day for 25 years    Types: Pipe  Quit date: 06/25/1995  . Smokeless tobacco: Never Used  . Alcohol Use: No  . Drug Use: No  . Sexual Activity: Not on file   Other Topics Concern  . Not on file   Social History Narrative     Review of Systems: General: negative for chills, fever, night sweats or weight changes.  Cardiovascular: negative for chest pain, dyspnea on exertion, edema, orthopnea, palpitations, paroxysmal nocturnal dyspnea or shortness of breath Dermatological: negative for rash Respiratory: negative for cough or wheezing Urologic: negative for hematuria Abdominal: negative for nausea, vomiting, diarrhea,  bright red blood per rectum, melena, or hematemesis Neurologic: negative for visual changes, syncope, or dizziness All other systems reviewed and are otherwise negative except as noted above.    Blood pressure 104/72, pulse 97, height 6' (1.829 m), weight 263 lb 1.6 oz (119.341 kg).  General appearance: alert and no distress Neck: no adenopathy, no carotid bruit, no JVD, supple, symmetrical, trachea midline and thyroid not enlarged, symmetric, no tenderness/mass/nodules Lungs: clear to auscultation bilaterally Heart: regular rate and rhythm, S1, S2 normal, no murmur, click, rub or gallop Extremities: extremities normal, atraumatic, no cyanosis or edema  EKG not  performed today  ASSESSMENT AND PLAN:     Coronary artery calcification seen on CAT scan Coronary calcification seen on chest CT. Nonischemic low risk Myoview stress test last year and again recently. Patient denies chest pain.  Essential hypertension History of hypertension with blood pressure checked in the office today 104/72. He is on losartan hydrochlorothiazide as well as a low-dose beta blocker which she takes because of migraine headaches. Continue current medication at current dosing  Hyperlipidemia History of hyperlipidemia on simvastatin 20 mg a day. His lipid profiles followed by his primary care physician. Continue current medication at current dosing  Diastolic CHF History of normal LV function with grade 1 diastolic dysfunction. He does have shortness of breath which could be multifactorial related to recent diagnosis of sarcoidosis as well as COPD. The systolic by Dr. Halford Chessman.      Lorretta Harp MD FACP,FACC,FAHA, [facility] 05/11/2014 11:32 AM

## 2014-05-11 NOTE — Assessment & Plan Note (Signed)
History of hyperlipidemia on simvastatin 20 mg a day. His lipid profiles followed by his primary care physician. Continue current medication at current dosing

## 2014-05-11 NOTE — Patient Instructions (Signed)
Your physician wants you to follow-up in: 1 year with Dr Berry. You will receive a reminder letter in the mail two months in advance. If you don't receive a letter, please call our office to schedule the follow-up appointment.  

## 2014-05-11 NOTE — Assessment & Plan Note (Signed)
History of hypertension with blood pressure checked in the office today 104/72. He is on losartan hydrochlorothiazide as well as a low-dose beta blocker which she takes because of migraine headaches. Continue current medication at current dosing

## 2014-05-11 NOTE — Assessment & Plan Note (Signed)
Coronary calcification seen on chest CT. Nonischemic low risk Myoview stress test last year and again recently. Patient denies chest pain.

## 2014-05-11 NOTE — Assessment & Plan Note (Signed)
History of normal LV function with grade 1 diastolic dysfunction. He does have shortness of breath which could be multifactorial related to recent diagnosis of sarcoidosis as well as COPD. The systolic by Dr. Halford Chessman.

## 2014-06-23 ENCOUNTER — Encounter: Payer: Self-pay | Admitting: Pulmonary Disease

## 2014-06-23 ENCOUNTER — Ambulatory Visit (INDEPENDENT_AMBULATORY_CARE_PROVIDER_SITE_OTHER): Payer: Medicare Other | Admitting: Pulmonary Disease

## 2014-06-23 VITALS — BP 122/80 | HR 87 | Ht 72.0 in | Wt 264.4 lb

## 2014-06-23 DIAGNOSIS — D860 Sarcoidosis of lung: Secondary | ICD-10-CM

## 2014-06-23 DIAGNOSIS — J449 Chronic obstructive pulmonary disease, unspecified: Secondary | ICD-10-CM

## 2014-06-23 NOTE — Progress Notes (Signed)
Chief Complaint  Patient presents with  . Follow-up    Pt reports having some improvement in cough/SOB/congestion since 11/12 appt with TP. States about 1 week ago he had increased cough with yellow mucus. Pt reports using cough supressant when needed.     History of Present Illness: Travis Ferguson is a 73 y.o. male former smoker with COPD, and sarcoidosis.  He was seen by Rexene Edison in November for exacerbation.  He is slowly improving.  He still has some cough, but not as much sputum.  He denies fever, or chest pain.  He denies leg swelling.  He continues to have significant back pain which limits his activity.  TESTS: PSG May 2008>> AHI 8  Spirometry 11/18/08>>FEV1 2.15 (61%), FEV1% 62  Bronchoscopy 07/28/12 >> focal granulomatous inflammation Spirometry 03/14/14 >> FEV1 2.10 (60%), FEV1% 57 Echo 03/22/14 >> EF 55 to 10%, grade 1 diastolic dysfx   CHEST IMAGING: CT chest 05/19/12>>superior segment LLL infiltrate, b/l hilar and mediastinal LAN with calcification CT chest 07/13/12 >> atherosclerosis, Lt hilar adenopathy, LLL superior segment narrowing, mass like opacity superior segment LLL 5.4 x 1.9 cm, multiple nodular areas LLL, diffuse GGO LLL PET scan 07/22/12 >> mildly hypermetabolic mediastinal adenopathy (SUV 5.8), superior segment nodules hypermetabolic (SUV 25.8) CT chest 08/31/12 >> decreased size of lesions CT chest 12/02/12 >> borderline mediastinal/hilar LAN, nodular septal thickening b/l, no change superior segment LLL, centrilobular emphysema CT chest 03/19/13 >> ?slight increase in LLL consolidative change, borderline LAN CT chest 08/24/13 >> 1.9 cm Rt paratracheal LAN, calcified 2.1 cm subcarinal LAN, narrowing of mainstem bronchi, chronic infiltrate LLL CT chest 03/16/14 >> atherosclerosis, no change in mediastinal and b/l hilar LAN, no change in LLL mass like opacity, thickening of interstitium, scattered sub-pleural nodules, small Lt pleural effusion, gallstones   PMHx,  PSHx, Medications, Allergies, Fhx, Shx reviewed.  Physical Exam: Blood pressure 122/80, pulse 87, height 6' (1.829 m), weight 264 lb 6.4 oz (119.931 kg), SpO2 93 %. Body mass index is 35.85 kg/(m^2).  General - No distress ENT - no sinus tenderness, no oral exudate, no LAN Cardiac - s1s2 regular, no murmur Chest - prolonged exhalation, no wheeze/rales Back - No focal tenderness Abd - Soft, non-tender Ext - No edema Neuro - Normal strength Skin - No rashes Psych - normal mood, and behavior   Assessment/Plan:  COPD with chronic bronchitis. Plan: - continue spiriva, symbicort, and prn proair  Pulmonary sarcoidosis. This has been stable on recent chest imaging studies. Plan: - monitor clinically - he will need f/u CT chest at some point >> will determine at next visit when to repeat   Chesley Mires, MD [facility] Pager:  [phone] 06/23/2014, 12:17 PM

## 2014-06-23 NOTE — Patient Instructions (Signed)
Follow up in 3 months

## 2014-07-04 ENCOUNTER — Encounter: Payer: Self-pay | Admitting: Internal Medicine

## 2014-07-19 ENCOUNTER — Ambulatory Visit (AMBULATORY_SURGERY_CENTER): Payer: Self-pay

## 2014-07-19 VITALS — Ht 72.0 in | Wt 263.2 lb

## 2014-07-19 DIAGNOSIS — Z86010 Personal history of colonic polyps: Secondary | ICD-10-CM

## 2014-07-19 MED ORDER — MOVIPREP 100 G PO SOLR: ORAL | Status: DC

## 2014-07-19 NOTE — Progress Notes (Signed)
Per pt, no allergies to soy or egg products.Pt not taking any weight loss meds or using  O2 at home. 

## 2014-07-28 ENCOUNTER — Other Ambulatory Visit: Payer: Self-pay | Admitting: Physical Medicine and Rehabilitation

## 2014-07-28 ENCOUNTER — Ambulatory Visit
Admission: RE | Admit: 2014-07-28 | Discharge: 2014-07-28 | Disposition: A | Discharge status: Home / Self Care | Payer: 59 | Source: Ambulatory Visit | Attending: Physical Medicine and Rehabilitation | Admitting: Physical Medicine and Rehabilitation

## 2014-07-28 DIAGNOSIS — M25561 Pain in right knee: Secondary | ICD-10-CM

## 2014-08-02 ENCOUNTER — Encounter: Payer: Self-pay | Admitting: Internal Medicine

## 2014-08-02 ENCOUNTER — Ambulatory Visit (AMBULATORY_SURGERY_CENTER): Payer: 59 | Admitting: Internal Medicine

## 2014-08-02 VITALS — BP 114/72 | HR 77 | Temp 97.6°F | Resp 15 | Ht 72.0 in | Wt 263.0 lb

## 2014-08-02 DIAGNOSIS — D123 Benign neoplasm of transverse colon: Secondary | ICD-10-CM

## 2014-08-02 DIAGNOSIS — D122 Benign neoplasm of ascending colon: Secondary | ICD-10-CM

## 2014-08-02 DIAGNOSIS — K635 Polyp of colon: Secondary | ICD-10-CM

## 2014-08-02 DIAGNOSIS — Z86010 Personal history of colonic polyps: Secondary | ICD-10-CM

## 2014-08-02 HISTORY — DX: Polyp of colon: K63.5

## 2014-08-02 MED ORDER — SODIUM CHLORIDE 0.9 % IV SOLN: 500.0000 mL | INTRAVENOUS | Status: DC

## 2014-08-02 NOTE — Progress Notes (Signed)
Patient awake, fluids increased for lower b/p with good result. Report to rn

## 2014-08-02 NOTE — Progress Notes (Signed)
Pt has 3 inhaler in a zip lock bag at the bedside.  Symbacort, proair and spriva.  maw

## 2014-08-02 NOTE — Progress Notes (Signed)
Called to room to assist during endoscopic procedure.  Patient ID and intended procedure confirmed with present staff. Received instructions for my participation in the procedure from the performing physician.  

## 2014-08-02 NOTE — Op Note (Signed)
[place]  [address]. [place], [address]   COLONOSCOPY PROCEDURE REPORT  PATIENT: Travis, Ferguson  MR#: [id number] BIRTHDATE: 12/20/1940 , 73  yrs. old GENDER: male ENDOSCOPIST: Eustace Quail, MD REFERRED LI:DCVU Perini, M.D. PROCEDURE DATE:  08/02/2014 PROCEDURE:   Colonoscopy with snare polypectomy x4 First Screening Colonoscopy - Avg.  risk and is 50 yrs.  old or older - No.  Prior Negative Screening - Now for repeat screening. N/A  History of Adenoma - Now for follow-up colonoscopy & has been > or = to 3 yrs.  N/A  Polyps Removed Today? Yes. ASA CLASS:   Class III INDICATIONS:average risk patient for colon cancer. Prior exam 2004 (Dr. Lajoyce Corners) no polyps. MEDICATIONS: Monitored anesthesia care and Propofol 350 mg IV  DESCRIPTION OF PROCEDURE:   After the risks benefits and alternatives of the procedure were thoroughly explained, informed consent was obtained.  The digital rectal exam revealed no abnormalities of the rectum.   The LB [id number]  endoscope was introduced through the anus and advanced to the cecum, which was identified by both the appendix and ileocecal valve. No adverse events experienced.   The quality of the prep was good, using MoviPrep  The instrument was then slowly withdrawn as the colon was fully examined.  COLON FINDINGS: Four polyps ranging between 3-77mm in size were found in the transverse (2) and ascending colon (2).  A polypectomy was performed with a cold snare.  The resection was complete, the polyp tissue was completely retrieved and sent to histology.   There was severe diverticulosis noted throughout the colon.   Multiple small nonbleeding  angiodysplastic lesions were found in the ascending colon and at the cecum.   The examination was otherwise normal. Retroflexed views revealed internal hemorrhoids. The time to cecum=1 minutes 19 seconds.  Withdrawal time=14 minutes 33 seconds. The scope was withdrawn and the  procedure completed. COMPLICATIONS: There were no immediate complications.  ENDOSCOPIC IMPRESSION: 1.   Four polyps were found in the transverse colon and ascending colon; polypectomy was performed with a cold snare 2.   Severe diverticulosis was noted throughout the entire examined colon 3.   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   The examination was otherwise normal  RECOMMENDATIONS: 1. Return to the care of your primary provider.  GI follow up as needed  eSigned:  Eustace Quail, MD 08/02/2014 10:21 AM   cc: Crist Infante, MD and The Patient

## 2014-08-02 NOTE — Patient Instructions (Signed)
YOU HAD AN ENDOSCOPIC PROCEDURE TODAY AT THE [facility]: Refer to the procedure report that was given to you for any specific questions about what was found during the examination.  If the procedure report does not answer your questions, please call your gastroenterologist to clarify.  If you requested that your care partner not be given the details of your procedure findings, then the procedure report has been included in a sealed envelope for you to review at your convenience later.  YOU SHOULD EXPECT: Some feelings of bloating in the abdomen. Passage of more gas than usual.  Walking can help get rid of the air that was put into your GI tract during the procedure and reduce the bloating. If you had a lower endoscopy (such as a colonoscopy or flexible sigmoidoscopy) you may notice spotting of blood in your stool or on the toilet paper. If you underwent a bowel prep for your procedure, then you may not have a normal bowel movement for a few days.  DIET: Your first meal following the procedure should be a light meal and then it is ok to progress to your normal diet.  A half-sandwich or bowl of soup is an example of a good first meal.  Heavy or fried foods are harder to digest and may make you feel nauseous or bloated.  Likewise meals heavy in dairy and vegetables can cause extra gas to form and this can also increase the bloating.  Drink plenty of fluids but you should avoid alcoholic beverages for 24 hours.  ACTIVITY: Your care partner should take you home directly after the procedure.  You should plan to take it easy, moving slowly for the rest of the day.  You can resume normal activity the day after the procedure however you should NOT DRIVE or use heavy machinery for 24 hours (because of the sedation medicines used during the test).    SYMPTOMS TO REPORT IMMEDIATELY: A gastroenterologist can be reached at any hour.  During normal business hours, 8:30 AM to 5:00 PM Monday through Friday,  call [phone].  After hours and on weekends, please call the GI answering service at [phone] who will take a message and have the physician on call contact you.   Following lower endoscopy (colonoscopy or flexible sigmoidoscopy):  Excessive amounts of blood in the stool  Significant tenderness or worsening of abdominal pains  Swelling of the abdomen that is new, acute  Fever of 100F or higher  FOLLOW UP: If any biopsies were taken you will be contacted by phone or by letter within the next 1-3 weeks.  Call your gastroenterologist if you have not heard about the biopsies in 3 weeks.  Our staff will call the home number listed on your records the next business day following your procedure to check on you and address any questions or concerns that you may have at that time regarding the information given to you following your procedure. This is a courtesy call and so if there is no answer at the home number and we have not heard from you through the emergency physician on call, we will assume that you have returned to your regular daily activities without incident.  SIGNATURES/CONFIDENTIALITY: You and/or your care partner have signed paperwork which will be entered into your electronic medical record.  These signatures attest to the fact that that the information above on your After Visit Summary has been reviewed and is understood.  Full responsibility of the confidentiality of this   discharge information lies with you and/or your care-partner.   Information on polyps & diverticulosis given to you today

## 2014-08-03 ENCOUNTER — Telehealth: Payer: Self-pay

## 2014-08-03 NOTE — Telephone Encounter (Signed)
No answer, left voicemail

## 2014-08-08 ENCOUNTER — Encounter: Payer: Self-pay | Admitting: Internal Medicine

## 2014-08-10 ENCOUNTER — Telehealth: Payer: Self-pay | Admitting: Pulmonary Disease

## 2014-08-10 NOTE — Telephone Encounter (Signed)
Pt c/o low grade fever (99), SOB and increased weakness. Pt states that Monday 08/08/14, he started throwing up and has thrown up about 7 times in the last 3 days, having rib pain on left and right side which started after vomiting and is now having muscle spasms in chest. Pt states that he has had 2 major vomiting spells where is came on suddenly and he was unable to get to the bathroom; "came out of nowhere." Pt reports that his stomach is very tender to the touch and rib region is as well. I advised pt that he might want to touch base with his GI specialist and pt states that he just had a Colonoscopy done x 1 week ago and they told him everything is normal and nothing to worry about. Pt feels this is something else not related to GI issues.  Please advise Dr Halford Chessman. Thanks.  Allergies  Allergen Reactions  . Statins Other (See Comments)    Muscle aches - tolerating Vytorin

## 2014-08-10 NOTE — Telephone Encounter (Signed)
He had colonoscopy with polypectomy last week.  He says several of his family members had stomach bug.  He starting getting nausea with vomiting and watery diarrhea since 08/08/14.  He has low grade temp of 4F.  He has been feeling weak.  He has been trying to drink fluid, but has not been eating much.  He was sore in his abdomen, but denies distention.  He has been feeling more short of breath and has more cough.  He has been feeling sore in his ribs.  He denies hematemesis or blood in his stool.  He is feeling better this afternoon compared to how he felt the past two days.  Advised that he likely has a GI virus, and that he should continue symptomatic therapy with fluids/rest/maintain food intake as tolerated.  He would like to defer getting CXR until his appt in March >> advised he could call sooner if his symptoms get worse.  I do not think his current symptoms are related to his recent colonoscopy, but advised him to call Dr. Blanch Media office to discuss with him.

## 2014-08-11 ENCOUNTER — Telehealth: Payer: Self-pay | Admitting: Internal Medicine

## 2014-08-11 NOTE — Telephone Encounter (Signed)
Pt states last Tuesday he had some diarrhea but on Friday the diarrhea got very bad and he had vomiting. Pts pulmonary doctor told him he thought he had the flu that had caused him to have the vomiting and diarrhea. Pt states his stomach is sore and his ribs are sore. Pulmonary doc told him the soreness could be from the vomiting and the diarrhea. Discussed with pt that if the diarrhea continued he could take imodium but he states it is better now. Pt states pulmonary doc wanted him to call and let Dr. Henrene Pastor know. Dr. Henrene Pastor notified.

## 2014-08-12 NOTE — Telephone Encounter (Signed)
Noted  

## 2014-08-15 ENCOUNTER — Other Ambulatory Visit: Payer: Self-pay | Admitting: Neurosurgery

## 2014-08-15 DIAGNOSIS — M5416 Radiculopathy, lumbar region: Secondary | ICD-10-CM

## 2014-08-16 ENCOUNTER — Inpatient Hospital Stay (HOSPITAL_COMMUNITY)
Admission: EM | Admit: 2014-08-16 | Discharge: 2014-08-25 | DRG: 853 | Disposition: A | Discharge status: Home Health | Payer: Medicare Other | Attending: Family Medicine | Admitting: Family Medicine

## 2014-08-16 ENCOUNTER — Encounter (HOSPITAL_COMMUNITY): Payer: Self-pay | Admitting: Emergency Medicine

## 2014-08-16 ENCOUNTER — Inpatient Hospital Stay (HOSPITAL_COMMUNITY): Payer: Medicare Other

## 2014-08-16 ENCOUNTER — Emergency Department (HOSPITAL_COMMUNITY): Payer: Medicare Other

## 2014-08-16 DIAGNOSIS — Z87891 Personal history of nicotine dependence: Secondary | ICD-10-CM | POA: Diagnosis not present

## 2014-08-16 DIAGNOSIS — E669 Obesity, unspecified: Secondary | ICD-10-CM | POA: Diagnosis present

## 2014-08-16 DIAGNOSIS — R52 Pain, unspecified: Secondary | ICD-10-CM

## 2014-08-16 DIAGNOSIS — K529 Noninfective gastroenteritis and colitis, unspecified: Secondary | ICD-10-CM | POA: Diagnosis present

## 2014-08-16 DIAGNOSIS — I5032 Chronic diastolic (congestive) heart failure: Secondary | ICD-10-CM | POA: Diagnosis present

## 2014-08-16 DIAGNOSIS — N400 Enlarged prostate without lower urinary tract symptoms: Secondary | ICD-10-CM | POA: Diagnosis present

## 2014-08-16 DIAGNOSIS — N179 Acute kidney failure, unspecified: Secondary | ICD-10-CM | POA: Diagnosis not present

## 2014-08-16 DIAGNOSIS — E860 Dehydration: Secondary | ICD-10-CM | POA: Diagnosis present

## 2014-08-16 DIAGNOSIS — K8050 Calculus of bile duct without cholangitis or cholecystitis without obstruction: Secondary | ICD-10-CM

## 2014-08-16 DIAGNOSIS — J9620 Acute and chronic respiratory failure, unspecified whether with hypoxia or hypercapnia: Secondary | ICD-10-CM | POA: Diagnosis not present

## 2014-08-16 DIAGNOSIS — R0600 Dyspnea, unspecified: Secondary | ICD-10-CM

## 2014-08-16 DIAGNOSIS — I10 Essential (primary) hypertension: Secondary | ICD-10-CM | POA: Diagnosis present

## 2014-08-16 DIAGNOSIS — G43909 Migraine, unspecified, not intractable, without status migrainosus: Secondary | ICD-10-CM | POA: Diagnosis present

## 2014-08-16 DIAGNOSIS — J189 Pneumonia, unspecified organism: Secondary | ICD-10-CM

## 2014-08-16 DIAGNOSIS — R062 Wheezing: Secondary | ICD-10-CM

## 2014-08-16 DIAGNOSIS — E876 Hypokalemia: Secondary | ICD-10-CM | POA: Diagnosis not present

## 2014-08-16 DIAGNOSIS — K8000 Calculus of gallbladder with acute cholecystitis without obstruction: Secondary | ICD-10-CM | POA: Diagnosis present

## 2014-08-16 DIAGNOSIS — R6520 Severe sepsis without septic shock: Secondary | ICD-10-CM | POA: Diagnosis not present

## 2014-08-16 DIAGNOSIS — K8062 Calculus of gallbladder and bile duct with acute cholecystitis without obstruction: Secondary | ICD-10-CM | POA: Diagnosis present

## 2014-08-16 DIAGNOSIS — F329 Major depressive disorder, single episode, unspecified: Secondary | ICD-10-CM | POA: Diagnosis present

## 2014-08-16 DIAGNOSIS — I5030 Unspecified diastolic (congestive) heart failure: Secondary | ICD-10-CM | POA: Diagnosis present

## 2014-08-16 DIAGNOSIS — Z888 Allergy status to other drugs, medicaments and biological substances status: Secondary | ICD-10-CM

## 2014-08-16 DIAGNOSIS — Z01811 Encounter for preprocedural respiratory examination: Secondary | ICD-10-CM

## 2014-08-16 DIAGNOSIS — I2510 Atherosclerotic heart disease of native coronary artery without angina pectoris: Secondary | ICD-10-CM | POA: Diagnosis present

## 2014-08-16 DIAGNOSIS — Z6835 Body mass index (BMI) 35.0-35.9, adult: Secondary | ICD-10-CM

## 2014-08-16 DIAGNOSIS — R197 Diarrhea, unspecified: Secondary | ICD-10-CM | POA: Diagnosis present

## 2014-08-16 DIAGNOSIS — F4310 Post-traumatic stress disorder, unspecified: Secondary | ICD-10-CM | POA: Diagnosis present

## 2014-08-16 DIAGNOSIS — E785 Hyperlipidemia, unspecified: Secondary | ICD-10-CM | POA: Diagnosis present

## 2014-08-16 DIAGNOSIS — G4733 Obstructive sleep apnea (adult) (pediatric): Secondary | ICD-10-CM | POA: Diagnosis present

## 2014-08-16 DIAGNOSIS — K219 Gastro-esophageal reflux disease without esophagitis: Secondary | ICD-10-CM | POA: Diagnosis present

## 2014-08-16 DIAGNOSIS — K810 Acute cholecystitis: Secondary | ICD-10-CM | POA: Diagnosis present

## 2014-08-16 DIAGNOSIS — J449 Chronic obstructive pulmonary disease, unspecified: Secondary | ICD-10-CM

## 2014-08-16 DIAGNOSIS — A419 Sepsis, unspecified organism: Secondary | ICD-10-CM | POA: Diagnosis present

## 2014-08-16 DIAGNOSIS — D869 Sarcoidosis, unspecified: Secondary | ICD-10-CM | POA: Diagnosis present

## 2014-08-16 DIAGNOSIS — J309 Allergic rhinitis, unspecified: Secondary | ICD-10-CM | POA: Diagnosis present

## 2014-08-16 DIAGNOSIS — R0602 Shortness of breath: Secondary | ICD-10-CM

## 2014-08-16 DIAGNOSIS — J438 Other emphysema: Secondary | ICD-10-CM | POA: Insufficient documentation

## 2014-08-16 DIAGNOSIS — J441 Chronic obstructive pulmonary disease with (acute) exacerbation: Secondary | ICD-10-CM | POA: Diagnosis not present

## 2014-08-16 DIAGNOSIS — K8001 Calculus of gallbladder with acute cholecystitis with obstruction: Secondary | ICD-10-CM | POA: Insufficient documentation

## 2014-08-16 DIAGNOSIS — R188 Other ascites: Secondary | ICD-10-CM

## 2014-08-16 DIAGNOSIS — M5030 Other cervical disc degeneration, unspecified cervical region: Secondary | ICD-10-CM | POA: Diagnosis present

## 2014-08-16 DIAGNOSIS — Z79899 Other long term (current) drug therapy: Secondary | ICD-10-CM | POA: Diagnosis not present

## 2014-08-16 LAB — CBC WITH DIFFERENTIAL/PLATELET
BASOS ABS: 0 10*3/uL (ref 0.0–0.1)
BASOS PCT: 0 % (ref 0–1)
EOS ABS: 0.2 10*3/uL (ref 0.0–0.7)
Eosinophils Relative: 1 % (ref 0–5)
HCT: 37.5 % — ABNORMAL LOW (ref 39.0–52.0)
Hemoglobin: 12.5 g/dL — ABNORMAL LOW (ref 13.0–17.0)
LYMPHS ABS: 1.3 10*3/uL (ref 0.7–4.0)
LYMPHS PCT: 6 % — AB (ref 12–46)
MCH: 27.4 pg (ref 26.0–34.0)
MCHC: 33.3 g/dL (ref 30.0–36.0)
MCV: 82.2 fL (ref 78.0–100.0)
Monocytes Absolute: 1.7 10*3/uL — ABNORMAL HIGH (ref 0.1–1.0)
Monocytes Relative: 8 % (ref 3–12)
Neutro Abs: 17.7 10*3/uL — ABNORMAL HIGH (ref 1.7–7.7)
Neutrophils Relative %: 85 % — ABNORMAL HIGH (ref 43–77)
PLATELETS: 385 10*3/uL (ref 150–400)
RBC: 4.56 MIL/uL (ref 4.22–5.81)
RDW: 14.7 % (ref 11.5–15.5)
WBC: 20.9 10*3/uL — AB (ref 4.0–10.5)

## 2014-08-16 LAB — URINALYSIS, ROUTINE W REFLEX MICROSCOPIC
Glucose, UA: NEGATIVE mg/dL
Hgb urine dipstick: NEGATIVE
Ketones, ur: 15 mg/dL — AB
Leukocytes, UA: NEGATIVE
Nitrite: NEGATIVE
PROTEIN: NEGATIVE mg/dL
Specific Gravity, Urine: 1.018 (ref 1.005–1.030)
Urobilinogen, UA: 4 mg/dL — ABNORMAL HIGH (ref 0.0–1.0)
pH: 6 (ref 5.0–8.0)

## 2014-08-16 LAB — COMPREHENSIVE METABOLIC PANEL
ALK PHOS: 177 U/L — AB (ref 39–117)
ALT: 26 U/L (ref 0–53)
ANION GAP: 14 (ref 5–15)
AST: 25 U/L (ref 0–37)
Albumin: 2.8 g/dL — ABNORMAL LOW (ref 3.5–5.2)
BUN: 29 mg/dL — AB (ref 6–23)
CALCIUM: 9 mg/dL (ref 8.4–10.5)
CO2: 19 mmol/L (ref 19–32)
CREATININE: 0.9 mg/dL (ref 0.50–1.35)
Chloride: 103 mmol/L (ref 96–112)
GFR calc non Af Amer: 82 mL/min — ABNORMAL LOW (ref >90)
GLUCOSE: 127 mg/dL — AB (ref 70–99)
Potassium: 3.6 mmol/L (ref 3.5–5.1)
Sodium: 136 mmol/L (ref 135–145)
TOTAL PROTEIN: 7.2 g/dL (ref 6.0–8.3)
Total Bilirubin: 2.5 mg/dL — ABNORMAL HIGH (ref 0.3–1.2)

## 2014-08-16 LAB — LACTIC ACID, PLASMA: LACTIC ACID, VENOUS: 1.5 mmol/L (ref 0.5–2.0)

## 2014-08-16 MED ORDER — ALBUTEROL SULFATE (2.5 MG/3ML) 0.083% IN NEBU
2.5000 mg | INHALATION_SOLUTION | Freq: Four times a day (QID) | RESPIRATORY_TRACT | Status: DC | PRN
  Administered 2014-08-17: 2.5 mg via RESPIRATORY_TRACT
  Filled 2014-08-16: qty 3

## 2014-08-16 MED ORDER — CIPROFLOXACIN IN D5W 400 MG/200ML IV SOLN
400.0000 mg | Freq: Two times a day (BID) | INTRAVENOUS | Status: DC
  Administered 2014-08-16: 400 mg via INTRAVENOUS
  Filled 2014-08-16: qty 200

## 2014-08-16 MED ORDER — SODIUM CHLORIDE 0.9 % IV BOLUS (SEPSIS)
2000.0000 mL | Freq: Once | INTRAVENOUS | Status: AC
  Administered 2014-08-16: 2000 mL via INTRAVENOUS

## 2014-08-16 MED ORDER — METOPROLOL TARTRATE 25 MG PO TABS
25.0000 mg | ORAL_TABLET | Freq: Every day | ORAL | Status: DC
  Filled 2014-08-16: qty 1

## 2014-08-16 MED ORDER — FINASTERIDE 5 MG PO TABS
5.0000 mg | ORAL_TABLET | Freq: Every evening | ORAL | Status: DC
  Administered 2014-08-16 – 2014-08-24 (×8): 5 mg via ORAL
  Filled 2014-08-16 (×11): qty 1

## 2014-08-16 MED ORDER — GABAPENTIN 300 MG PO CAPS
900.0000 mg | ORAL_CAPSULE | Freq: Every day | ORAL | Status: DC | PRN
  Filled 2014-08-16: qty 3

## 2014-08-16 MED ORDER — HYDROCODONE-ACETAMINOPHEN 5-325 MG PO TABS
2.0000 | ORAL_TABLET | Freq: Two times a day (BID) | ORAL | Status: DC | PRN
  Administered 2014-08-16 – 2014-08-18 (×2): 2 via ORAL
  Filled 2014-08-16 (×2): qty 2

## 2014-08-16 MED ORDER — HEPARIN SODIUM (PORCINE) 5000 UNIT/ML IJ SOLN
5000.0000 [IU] | Freq: Three times a day (TID) | INTRAMUSCULAR | Status: DC
  Administered 2014-08-16 – 2014-08-25 (×23): 5000 [IU] via SUBCUTANEOUS
  Filled 2014-08-16 (×33): qty 1

## 2014-08-16 MED ORDER — ONDANSETRON HCL 4 MG/2ML IJ SOLN: 4.0000 mg | Freq: Four times a day (QID) | INTRAMUSCULAR | Status: DC | PRN

## 2014-08-16 MED ORDER — ACETAMINOPHEN 325 MG PO TABS
650.0000 mg | ORAL_TABLET | Freq: Four times a day (QID) | ORAL | Status: DC | PRN
  Administered 2014-08-21: 650 mg via ORAL
  Filled 2014-08-16: qty 2

## 2014-08-16 MED ORDER — BUDESONIDE-FORMOTEROL FUMARATE 160-4.5 MCG/ACT IN AERO
2.0000 | INHALATION_SPRAY | Freq: Two times a day (BID) | RESPIRATORY_TRACT | Status: DC
  Administered 2014-08-16 – 2014-08-19 (×6): 2 via RESPIRATORY_TRACT
  Filled 2014-08-16: qty 6

## 2014-08-16 MED ORDER — FLUOXETINE HCL 20 MG PO CAPS
40.0000 mg | ORAL_CAPSULE | Freq: Every day | ORAL | Status: DC
  Administered 2014-08-17 – 2014-08-25 (×9): 40 mg via ORAL
  Filled 2014-08-16 (×12): qty 2

## 2014-08-16 MED ORDER — ONDANSETRON HCL 4 MG PO TABS: 4.0000 mg | ORAL_TABLET | Freq: Four times a day (QID) | ORAL | Status: DC | PRN

## 2014-08-16 MED ORDER — IOHEXOL 300 MG/ML  SOLN
25.0000 mL | INTRAMUSCULAR | Status: AC
  Administered 2014-08-16 (×2): 25 mL via ORAL

## 2014-08-16 MED ORDER — SODIUM CHLORIDE 0.9 % IV SOLN
INTRAVENOUS | Status: DC
  Administered 2014-08-16: 14:00:00 via INTRAVENOUS
  Administered 2014-08-17: 1000 mL via INTRAVENOUS
  Administered 2014-08-19 – 2014-08-20 (×2): via INTRAVENOUS
  Administered 2014-08-21: 75 mL/h via INTRAVENOUS
  Administered 2014-08-22: 22:00:00 via INTRAVENOUS
  Administered 2014-08-23: 75 mL/h via INTRAVENOUS

## 2014-08-16 MED ORDER — METRONIDAZOLE IN NACL 5-0.79 MG/ML-% IV SOLN
500.0000 mg | Freq: Three times a day (TID) | INTRAVENOUS | Status: DC
  Administered 2014-08-16: 500 mg via INTRAVENOUS
  Filled 2014-08-16 (×2): qty 100

## 2014-08-16 MED ORDER — LORAZEPAM 2 MG/ML IJ SOLN
0.5000 mg | Freq: Once | INTRAMUSCULAR | Status: AC
  Administered 2014-08-16: 0.5 mg via INTRAVENOUS
  Filled 2014-08-16: qty 1

## 2014-08-16 MED ORDER — SIMVASTATIN 20 MG PO TABS
20.0000 mg | ORAL_TABLET | Freq: Every evening | ORAL | Status: DC
  Administered 2014-08-16 – 2014-08-24 (×8): 20 mg via ORAL
  Filled 2014-08-16 (×11): qty 1

## 2014-08-16 MED ORDER — METRONIDAZOLE IN NACL 5-0.79 MG/ML-% IV SOLN
500.0000 mg | Freq: Three times a day (TID) | INTRAVENOUS | Status: DC
  Filled 2014-08-16: qty 100

## 2014-08-16 MED ORDER — ACETAMINOPHEN 650 MG RE SUPP: 650.0000 mg | Freq: Four times a day (QID) | RECTAL | Status: DC | PRN

## 2014-08-16 MED ORDER — TIOTROPIUM BROMIDE MONOHYDRATE 18 MCG IN CAPS
18.0000 ug | ORAL_CAPSULE | Freq: Every day | RESPIRATORY_TRACT | Status: DC
  Administered 2014-08-17: 18 ug via RESPIRATORY_TRACT
  Filled 2014-08-16: qty 5

## 2014-08-16 MED ORDER — MORPHINE SULFATE 2 MG/ML IJ SOLN
2.0000 mg | INTRAMUSCULAR | Status: DC | PRN
  Administered 2014-08-17: 2 mg via INTRAVENOUS
  Filled 2014-08-16: qty 1

## 2014-08-16 MED ORDER — SODIUM CHLORIDE 0.9 % IV SOLN
3.0000 g | Freq: Four times a day (QID) | INTRAVENOUS | Status: DC
  Administered 2014-08-16 – 2014-08-17 (×3): 3 g via INTRAVENOUS
  Filled 2014-08-16 (×4): qty 3

## 2014-08-16 MED ORDER — METOPROLOL TARTRATE 25 MG PO TABS
25.0000 mg | ORAL_TABLET | Freq: Every day | ORAL | Status: DC
  Administered 2014-08-16 – 2014-08-25 (×10): 25 mg via ORAL
  Filled 2014-08-16 (×12): qty 1

## 2014-08-16 NOTE — Progress Notes (Signed)
ANTIBIOTIC CONSULT NOTE - INITIAL  Pharmacy Consult for Cipro Indication: Intra-abdominal infection  Allergies  Allergen Reactions  . Statins Other (See Comments)    Muscle aches - tolerating Vytorin    Patient Measurements:   Height: 72 inches Weight: 120 kg  Vital Signs: Temp: 97.9 F (36.6 C) (02/23 1221) Temp Source: Oral (02/23 1221) BP: 141/84 mmHg (02/23 1221) Pulse Rate: 118 (02/23 1221) Intake/Output from previous day:   Intake/Output from this shift: Total I/O In: -  Out: 250 [Urine:250]  Labs:  Recent Labs  08/16/14 1040  WBC 20.9*  HGB 12.5*  PLT 385  CREATININE 0.90   CrCl cannot be calculated (Unknown ideal weight.). No results for input(s): VANCOTROUGH, VANCOPEAK, VANCORANDOM, GENTTROUGH, GENTPEAK, GENTRANDOM, TOBRATROUGH, TOBRAPEAK, TOBRARND, AMIKACINPEAK, AMIKACINTROU, AMIKACIN in the last 72 hours.   Microbiology: No results found for this or any previous visit (from the past 720 hour(s)).  Medical History: Past Medical History  Diagnosis Date  . 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  . Depression   . PTSD (post-traumatic stress disorder)   . OSA (obstructive sleep apnea)   . Migraine   . GERD (gastroesophageal reflux disease)   . Dyslipidemia   . Rhinitis   . Cervical disc disease   . Subdural hematoma 11/10    a. 2010 s/p surgery - diagnosed several weeks after a fall.  . Hypertension   . Coronary artery calcification seen on CAT scan   . Sarcoidosis   . Atherosclerosis   . Dyspnea     chronic  . Back pain     chronic back pain/under pain management  . Inguinal hernia     right side    Medications:  Scheduled:  . budesonide-formoterol  2 puff Inhalation BID  . finasteride  5 mg Oral QPM  . [START ON 08/17/2014] FLUoxetine  40 mg Oral Q breakfast  . heparin  5,000 Units Subcutaneous 3 times per day  . [START ON 08/17/2014] metoprolol tartrate  25 mg Oral Q breakfast  .  metronidazole  500 mg Intravenous Q8H  . simvastatin  20 mg Oral QPM  . [START ON 08/17/2014] tiotropium  18 mcg Inhalation Daily   Infusions:  . sodium chloride     PRN: acetaminophen **OR** acetaminophen, albuterol, gabapentin, HYDROcodone-acetaminophen, morphine injection, ondansetron **OR** ondansetron (ZOFRAN) IV  Assessment: 74 yo male presents with N/V/D x 2 weeks, starting after colonoscopy. CT of abd/pelivs ordered. Pharmacy is consulted to dose cipro for possible infectious colitis as patient acknowledges positive sick contacts.  2/23 >> Cipro >> 2/23 >> Flagyl (MD) >>  Tmax: AF WBC: 20.9 Renal: SCr 0.9  2/23 blood: 2/23 stool: 2/23 Cdiff:  Goal of Therapy:  Dose appropriate for indication, renal function  Plan:   Cipro 400mg  IV q12h Follow up renal function & cultures  Peggyann Juba, PharmD, BCPS Pager: [phone] 08/16/2014,1:43 PM

## 2014-08-16 NOTE — ED Provider Notes (Signed)
CSN: [id number]     Arrival date & time 08/16/14  4098 History   First MD Initiated Contact with Patient 08/16/14 1020     Chief Complaint  Patient presents with  . Shortness of Breath  . Nausea  . Diarrhea     (Consider location/radiation/quality/duration/timing/severity/associated sxs/prior Treatment) Patient is a 74 y.o. male presenting with diarrhea. The history is provided by the patient (the pt complains of diarhea for a week  and weaknes).  Diarrhea Quality:  Mucous Severity:  Moderate Onset quality:  Sudden Timing:  Constant Progression:  Unchanged Relieved by:  Nothing Worsened by:  Nothing tried Ineffective treatments:  None tried Associated symptoms: no abdominal pain and no headaches     Past Medical History  Diagnosis Date  . 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  . Depression   . PTSD (post-traumatic stress disorder)   . OSA (obstructive sleep apnea)   . Migraine   . GERD (gastroesophageal reflux disease)   . Dyslipidemia   . Rhinitis   . Cervical disc disease   . Subdural hematoma 11/10    a. 2010 s/p surgery - diagnosed several weeks after a fall.  . Hypertension   . Coronary artery calcification seen on CAT scan   . Sarcoidosis   . Atherosclerosis   . Dyspnea     chronic  . Back pain     chronic back pain/under pain management  . Inguinal hernia     right side   Past Surgical History  Procedure Laterality Date  . Craniotomy  11/10    right frontal  . Video bronchoscopy  07/28/2012    Procedure: VIDEO BRONCHOSCOPY WITH FLUORO;  Surgeon: Chesley Mires, MD;  Location: [facility];  Service: Cardiopulmonary;  Laterality: Bilateral;  . Hernia repair Bilateral     right and left inguinal  . Knee arthroscopy Right   . Lumbar laminectomy/decompression microdiscectomy Right 12/15/2012    Procedure: LUMBAR LAMINECTOMY/DECOMPRESSION MICRODISCECTOMY 1 LEVEL;  Surgeon: Elaina Hoops, MD;  Location: [facility];  Service: Neurosurgery;  Laterality: Right;  Right Lumbar four-five laminectomy/foraminotomy   Family History  Problem Relation Age of Onset  . Coronary artery disease    . Colon cancer Neg Hx   . Pancreatic cancer Neg Hx   . Rectal cancer Neg Hx   . Esophageal cancer Neg Hx   . Stomach cancer Paternal Grandmother   . Heart disease Mother   . Heart disease Father   . Colon polyps Brother   . Congestive Heart Failure Brother    History  Substance Use Topics  . Smoking status: Former Smoker -- 0.00 packs/day for 25 years    Types: Pipe    Quit date: 06/25/1995  . Smokeless tobacco: Never Used  . Alcohol Use: No    Review of Systems  Constitutional: Negative for appetite change and fatigue.  HENT: Negative for congestion, ear discharge and sinus pressure.   Eyes: Negative for discharge.  Respiratory: Positive for shortness of breath. Negative for cough.   Cardiovascular: Negative for chest pain.  Gastrointestinal: Positive for diarrhea. Negative for abdominal pain.  Genitourinary: Negative for frequency and hematuria.  Musculoskeletal: Negative for back pain.  Skin: Negative for rash.  Neurological: Negative for seizures and headaches.  Psychiatric/Behavioral: Negative for hallucinations.      Allergies  Statins  Home Medications   Prior to Admission medications   Medication Sig Start Date End Date Taking? Authorizing  Provider  albuterol (PROVENTIL) (2.5 MG/3ML) 0.083% nebulizer solution Take 2.5 mg by nebulization every 6 (six) hours as needed for wheezing or shortness of breath.   Yes Historical Provider, MD  budesonide-formoterol (SYMBICORT) 160-4.5 MCG/ACT inhaler Inhale 2 puffs into the lungs 2 (two) times daily. 05/11/12  Yes Tammy S Parrett, NP  finasteride (PROSCAR) 5 MG tablet Take 1 tablet by mouth every evening.  01/07/13  Yes Historical Provider, MD  FLUoxetine (PROZAC) 40 MG capsule Take 40 mg by mouth daily with breakfast.    Yes Historical  Provider, MD  gabapentin (NEURONTIN) 300 MG capsule Take 900 mg by mouth daily as needed (for nerve pain).    Yes Historical Provider, MD  HYDROcodone-acetaminophen (NORCO/VICODIN) 5-325 MG per tablet Take 2 tablets by mouth 2 (two) times daily as needed (for pain).    Yes Historical Provider, MD  losartan-hydrochlorothiazide (HYZAAR) 50-12.5 MG per tablet Take 0.5 tablets by mouth daily with breakfast.  03/23/12  Yes Historical Provider, MD  metoprolol tartrate (LOPRESSOR) 25 MG tablet Take 25 mg by mouth daily with breakfast.  04/06/12  Yes Historical Provider, MD  PROAIR HFA 108 (90 BASE) MCG/ACT inhaler 1-2 PUFFS EVERY 4-6 HOURS AS NEEDED Patient taking differently: Inhale 1-2 puffs every 4 to 6 hours as needed for shortness of breath 02/21/14  Yes Chesley Mires, MD  simvastatin (ZOCOR) 20 MG tablet Take 1 tablet by mouth every evening.  04/10/14  Yes Historical Provider, MD  tiotropium (SPIRIVA) 18 MCG inhalation capsule Place 1 capsule (18 mcg total) into inhaler and inhale daily. 01/28/12  Yes Vineet Sood, MD   BP 141/84 mmHg  Pulse 118  Temp(Src) 97.9 F (36.6 C) (Oral)  Resp 27  SpO2 93% Physical Exam  Constitutional: He is oriented to person, place, and time. He appears well-developed.  HENT:  Head: Normocephalic.  Eyes: Conjunctivae and EOM are normal. No scleral icterus.  Neck: Neck supple. No thyromegaly present.  Cardiovascular: Normal rate and regular rhythm.  Exam reveals no gallop and no friction rub.   No murmur heard. Pulmonary/Chest: No stridor. He has no wheezes. He has no rales. He exhibits no tenderness.  Abdominal: He exhibits no distension. There is no tenderness. There is no rebound.  abd mildly distended  Musculoskeletal: Normal range of motion. He exhibits no edema.  Lymphadenopathy:    He has no cervical adenopathy.  Neurological: He is oriented to person, place, and time. He exhibits normal muscle tone. Coordination normal.  Skin: No rash noted. No erythema.   Psychiatric: He has a normal mood and affect. His behavior is normal.    ED Course  Procedures (including critical care time) Labs Review Labs Reviewed  CBC WITH DIFFERENTIAL/PLATELET - Abnormal; Notable for the following:    WBC 20.9 (*)    Hemoglobin 12.5 (*)    HCT 37.5 (*)    Neutrophils Relative % 85 (*)    Lymphocytes Relative 6 (*)    Neutro Abs 17.7 (*)    Monocytes Absolute 1.7 (*)    All other components within normal limits  COMPREHENSIVE METABOLIC PANEL - Abnormal; Notable for the following:    Glucose, Bld 127 (*)    BUN 29 (*)    Albumin 2.8 (*)    Alkaline Phosphatase 177 (*)    Total Bilirubin 2.5 (*)    GFR calc non Af Amer 82 (*)    All other components within normal limits  CULTURE, BLOOD (ROUTINE X 2)  CULTURE, BLOOD (ROUTINE X 2)  CLOSTRIDIUM DIFFICILE BY PCR  LACTIC ACID, PLASMA  URINALYSIS, ROUTINE W REFLEX MICROSCOPIC    Imaging Review Dg Chest Port 1 View  08/16/2014   CLINICAL DATA:  Shortness of Breath  EXAM: PORTABLE CHEST - 1 VIEW  COMPARISON:  04/07/2014  FINDINGS: Cardiomediastinal silhouette is stable. Persistent streaky atelectasis or scarring left base. No segmental infiltrate or convincing pulmonary edema .  IMPRESSION: Persistent streaky atelectasis or scarring left base. No infiltrate or pulmonary edema.   Electronically Signed   By: Lahoma Crocker M.D.   On: 08/16/2014 10:54     EKG Interpretation   Date/Time:  Tuesday August 16 2014 09:59:49 EST Ventricular Rate:  128 PR Interval:  149 QRS Duration: 99 QT Interval:  310 QTC Calculation: 452 R Axis:   70 Text Interpretation:  Sinus tachycardia Multiform ventricular premature  complexes Low voltage, precordial leads Borderline T abnormalities,  inferior leads Confirmed by [place] Nolton  MD, Valoria Tamburri [id number]) on 08/16/2014  12:35:55 PM      MDM   Final diagnoses:  Pain  Colitis    admit    Maudry Diego, MD 08/16/14 [phone]

## 2014-08-16 NOTE — H&P (Signed)
[facility] History and Physical  Travis Ferguson PZW:[id number] DOB: 31-Jan-1941 DOA: 08/16/2014  Referring physician: Emergency Department PCP: Jerlyn Ly, MD  Specialists:   Chief Complaint: N/V/D  HPI: Travis Ferguson is a 74 y.o. male  With a hx of COPD, PTSD, HLD, HTN who presents to the ED with 2 week hx of diarrhea, nausea, and vomiting. Pt acknowledges positive sick contacts. Reports symptoms started shortly after recent colonoscopy. In the ED, pt noted to have leukocytosis of 20k. BUN mildly elevated at 29. Pt was started on IVF and hospitalist consulted  Review of Systems: Review of Systems  Constitutional: Positive for malaise/fatigue. Negative for fever and chills.  HENT: Negative for ear pain and hearing loss.   Eyes: Negative for blurred vision and pain.  Respiratory: Negative for cough, sputum production and stridor.   Cardiovascular: Negative for chest pain, orthopnea and PND.  Gastrointestinal: Positive for heartburn, nausea, vomiting, abdominal pain and diarrhea.  Genitourinary: Negative for urgency and frequency.  Musculoskeletal: Positive for back pain and joint pain.  Skin: Negative for rash.  Neurological: Negative for focal weakness, seizures and loss of consciousness.  Psychiatric/Behavioral: Negative for hallucinations. The patient is not nervous/anxious.      Past Medical History  Diagnosis Date  . 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  . Depression   . PTSD (post-traumatic stress disorder)   . OSA (obstructive sleep apnea)   . Migraine   . GERD (gastroesophageal reflux disease)   . Dyslipidemia   . Rhinitis   . Cervical disc disease   . Subdural hematoma 11/10    a. 2010 s/p surgery - diagnosed several weeks after a fall.  . Hypertension   . Coronary artery calcification seen on CAT scan   . Sarcoidosis   . Atherosclerosis   . Dyspnea     chronic  . Back pain     chronic back  pain/under pain management  . Inguinal hernia     right side   Past Surgical History  Procedure Laterality Date  . Craniotomy  11/10    right frontal  . Video bronchoscopy  07/28/2012    Procedure: VIDEO BRONCHOSCOPY WITH FLUORO;  Surgeon: Chesley Mires, MD;  Location: [facility];  Service: Cardiopulmonary;  Laterality: Bilateral;  . Hernia repair Bilateral     right and left inguinal  . Knee arthroscopy Right   . Lumbar laminectomy/decompression microdiscectomy Right 12/15/2012    Procedure: LUMBAR LAMINECTOMY/DECOMPRESSION MICRODISCECTOMY 1 LEVEL;  Surgeon: Elaina Hoops, MD;  Location: [facility];  Service: Neurosurgery;  Laterality: Right;  Right Lumbar four-five laminectomy/foraminotomy   Social History:  reports that he quit smoking about 19 years ago. His smoking use included Pipe. He has never used smokeless tobacco. He reports that he does not drink alcohol or use illicit drugs.  where does patient live--home, ALF, SNF? and with whom if at home?  Can patient participate in ADLs?  Allergies  Allergen Reactions  . Statins Other (See Comments)    Muscle aches - tolerating Vytorin    Family History  Problem Relation Age of Onset  . Coronary artery disease    . Colon cancer Neg Hx   . Pancreatic cancer Neg Hx   . Rectal cancer Neg Hx   . Esophageal cancer Neg Hx   . Stomach cancer Paternal Grandmother   . Heart disease Mother   . Heart disease Father   . Colon polyps Brother   .  Congestive Heart Failure Brother     (be sure to complete)  Prior to Admission medications   Medication Sig Start Date End Date Taking? Authorizing Provider  albuterol (PROVENTIL) (2.5 MG/3ML) 0.083% nebulizer solution Take 2.5 mg by nebulization every 6 (six) hours as needed for wheezing or shortness of breath.   Yes Historical Provider, MD  budesonide-formoterol (SYMBICORT) 160-4.5 MCG/ACT inhaler Inhale 2 puffs into the lungs 2 (two) times daily. 05/11/12  Yes Tammy S Parrett, NP  finasteride  (PROSCAR) 5 MG tablet Take 1 tablet by mouth every evening.  01/07/13  Yes Historical Provider, MD  FLUoxetine (PROZAC) 40 MG capsule Take 40 mg by mouth daily with breakfast.    Yes Historical Provider, MD  gabapentin (NEURONTIN) 300 MG capsule Take 900 mg by mouth daily as needed (for nerve pain).    Yes Historical Provider, MD  HYDROcodone-acetaminophen (NORCO/VICODIN) 5-325 MG per tablet Take 2 tablets by mouth 2 (two) times daily as needed (for pain).    Yes Historical Provider, MD  losartan-hydrochlorothiazide (HYZAAR) 50-12.5 MG per tablet Take 0.5 tablets by mouth daily with breakfast.  03/23/12  Yes Historical Provider, MD  metoprolol tartrate (LOPRESSOR) 25 MG tablet Take 25 mg by mouth daily with breakfast.  04/06/12  Yes Historical Provider, MD  PROAIR HFA 108 (90 BASE) MCG/ACT inhaler 1-2 PUFFS EVERY 4-6 HOURS AS NEEDED Patient taking differently: Inhale 1-2 puffs every 4 to 6 hours as needed for shortness of breath 02/21/14  Yes Chesley Mires, MD  simvastatin (ZOCOR) 20 MG tablet Take 1 tablet by mouth every evening.  04/10/14  Yes Historical Provider, MD  tiotropium (SPIRIVA) 18 MCG inhalation capsule Place 1 capsule (18 mcg total) into inhaler and inhale daily. 01/28/12  Yes Chesley Mires, MD   Physical Exam: Filed Vitals:   08/16/14 1000 08/16/14 1221  BP: 123/78 141/84  Pulse: 127 118  Temp: 98.5 F (36.9 C) 97.9 F (36.6 C)  TempSrc: Oral Oral  Resp: 28 27  SpO2: 92% 93%     General:  Awake, in nad  Eyes: PERRL B  ENT: membranes moist, dentition fair  Neck: trachea midline, neck supple  Cardiovascular: regular, s1, s2  Respiratory: normal resp effort, no wheezing  Abdomen: soft,nondistended, pos BS  Skin: normal skin turgor, no abnormal skin lesions seen  Musculoskeletal: perfused, no clubbing  Psychiatric: mood/affect normal// no auditory/visual hallucinations  Neurologic: cn2-12 grossly intact, strength/sensation intact  Labs on Admission:  Basic Metabolic  Panel:  Recent Labs Lab 08/16/14 1040  NA 136  K 3.6  CL 103  CO2 19  GLUCOSE 127*  BUN 29*  CREATININE 0.90  CALCIUM 9.0   Liver Function Tests:  Recent Labs Lab 08/16/14 1040  AST 25  ALT 26  ALKPHOS 177*  BILITOT 2.5*  PROT 7.2  ALBUMIN 2.8*   No results for input(s): LIPASE, AMYLASE in the last 168 hours. No results for input(s): AMMONIA in the last 168 hours. CBC:  Recent Labs Lab 08/16/14 1040  WBC 20.9*  NEUTROABS 17.7*  HGB 12.5*  HCT 37.5*  MCV 82.2  PLT 385   Cardiac Enzymes: No results for input(s): CKTOTAL, CKMB, CKMBINDEX, TROPONINI in the last 168 hours.  BNP (last 3 results) No results for input(s): BNP in the last 8760 hours.  ProBNP (last 3 results)  Recent Labs  03/17/14 1305  PROBNP 235.0*    CBG: No results for input(s): GLUCAP in the last 168 hours.  Radiological Exams on Admission: Dg Chest [facility]  08/16/2014   CLINICAL DATA:  Shortness of Breath  EXAM: PORTABLE CHEST - 1 VIEW  COMPARISON:  04/07/2014  FINDINGS: Cardiomediastinal silhouette is stable. Persistent streaky atelectasis or scarring left base. No segmental infiltrate or convincing pulmonary edema .  IMPRESSION: Persistent streaky atelectasis or scarring left base. No infiltrate or pulmonary edema.   Electronically Signed   By: Lahoma Crocker M.D.   On: 08/16/2014 10:54    Assessment/Plan Active Problems:   COPD with chronic bronchitis   Essential hypertension   Hyperlipidemia   Diastolic CHF   Colitis   1. Suspected infectious colitis 1. Will obtain ct abd/pelvis 2. Will start empiric cipro and flagyl 3. Check stool culture and cdiff 4. Admit to med-surg 5. Cont hydration 2. Hx diastolic CHF 1. Stable 2. Clinically dehydrated at this time 3. Hydrate per above 3. HTN 1. BP stable 2. Cont monitor 4. COPD 1. On min O2 support 2. Stable 3. Cont bronchodilators as needed 5. HLD 1. Stable 2. Continue home statin 6. DVT prophylaxis 1. Heparin  subQ  Code Status: Full (must indicate code status--if unknown or must be presumed, indicate so) Family Communication: Pt in room (indicate person spoken with, if applicable, with phone number if by telephone) Disposition Plan: admit to med-surg (indicate anticipated LOS)   CHIU, STEPHEN K [facility] Pager [phone]  If 7PM-7AM, please contact night-coverage [email or web address] Password [email or web address] 08/16/2014, 1:21 PM

## 2014-08-16 NOTE — ED Notes (Addendum)
Pt c/o SOB x 2 weeks despite nebulizer tx. Pt does have COPD. Pt has been c/o n/v/d that started last Monday night. Pt c/o lower back and left lower side pain. Wife also states that pt becomes incoherent at times and confused. Pt had routine colonoscopy week before last and wife states this is around when things started happening.

## 2014-08-16 NOTE — Consult Note (Signed)
Reason for Consult: Severe cholecystitis Referring Physician: Dr Elvera Lennox is an 74 y.o. male.  HPI: We were asked to see this patient due to apparent severe cholecystitis. History is obtained from the patient and his wife. About 10-14 days ago he had the onset of his illness which initially consisted mainly of severe nausea with occasional small volume emesis and diarrhea which was dark and nonbloody. At that time there had been a GI illness going through his family and they initially thought that he had caught this. However his symptoms have persisted. He is continued to have severe nausea and occasional vomiting. Several days later he developed intermittent low-grade fever up to about 99-100. He also states he developed abdominal pain several days ago which he describes as across his lower abdomen but his wife says he was also having some pain in his upper right abdomen. The pain has actually resolved at this time. He has gradually been feeling weaker and has had some mild confusion for the last 2 days.  He was brought to the emergency room today for evaluation. He denies chronic GI complaints or previous history of similar symptoms. About 2 weeks ago he had a normal screening colonoscopy. His urine has been somewhat orange in color. No yellow color to the skin or eyes.  Past Medical History  Diagnosis Date  . 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  . Depression   . PTSD (post-traumatic stress disorder)   . OSA (obstructive sleep apnea)   . Migraine   . GERD (gastroesophageal reflux disease)   . Dyslipidemia   . Rhinitis   . Cervical disc disease   . Subdural hematoma 11/10    a. 2010 s/p surgery - diagnosed several weeks after a fall.  . Hypertension   . Coronary artery calcification seen on CAT scan   . Sarcoidosis   . Atherosclerosis   . Dyspnea     chronic  . Back pain     chronic back pain/under pain management  .  Inguinal hernia     right side    Past Surgical History  Procedure Laterality Date  . Craniotomy  11/10    right frontal  . Video bronchoscopy  07/28/2012    Procedure: VIDEO BRONCHOSCOPY WITH FLUORO;  Surgeon: Chesley Mires, MD;  Location: [facility];  Service: Cardiopulmonary;  Laterality: Bilateral;  . Hernia repair Bilateral     right and left inguinal  . Knee arthroscopy Right   . Lumbar laminectomy/decompression microdiscectomy Right 12/15/2012    Procedure: LUMBAR LAMINECTOMY/DECOMPRESSION MICRODISCECTOMY 1 LEVEL;  Surgeon: Elaina Hoops, MD;  Location: [facility];  Service: Neurosurgery;  Laterality: Right;  Right Lumbar four-five laminectomy/foraminotomy    Family History  Problem Relation Age of Onset  . Coronary artery disease    . Colon cancer Neg Hx   . Pancreatic cancer Neg Hx   . Rectal cancer Neg Hx   . Esophageal cancer Neg Hx   . Stomach cancer Paternal Grandmother   . Heart disease Mother   . Heart disease Father   . Colon polyps Brother   . Congestive Heart Failure Brother     Social History:  reports that he quit smoking about 19 years ago. His smoking use included Pipe. He has never used smokeless tobacco. He reports that he does not drink alcohol or use illicit drugs.  Allergies:  Allergies  Allergen Reactions  . Statins Other (  See Comments)    Muscle aches - tolerating Vytorin    Current Facility-Administered Medications  Medication Dose Route Frequency Provider Last Rate Last Dose  . 0.9 %  sodium chloride infusion   Intravenous Continuous Donne Hazel, MD 100 mL/hr at 08/16/14 1407    . acetaminophen (TYLENOL) tablet 650 mg  650 mg Oral Q6H PRN Donne Hazel, MD       Or  . acetaminophen (TYLENOL) suppository 650 mg  650 mg Rectal Q6H PRN Donne Hazel, MD      . albuterol (PROVENTIL) (2.5 MG/3ML) 0.083% nebulizer solution 2.5 mg  2.5 mg Nebulization Q6H PRN Donne Hazel, MD      . Ampicillin-Sulbactam (UNASYN) 3 g in sodium chloride 0.9 %  100 mL IVPB  3 g Intravenous Q6H Leann Trefz Poindexter, RPH   3 g at 08/16/14 2000  . budesonide-formoterol (SYMBICORT) 160-4.5 MCG/ACT inhaler 2 puff  2 puff Inhalation BID Donne Hazel, MD   2 puff at 08/16/14 2112  . finasteride (PROSCAR) tablet 5 mg  5 mg Oral QPM Donne Hazel, MD   5 mg at 08/16/14 1755  . [START ON 08/17/2014] FLUoxetine (PROZAC) capsule 40 mg  40 mg Oral Q breakfast Donne Hazel, MD      . gabapentin (NEURONTIN) capsule 900 mg  900 mg Oral Daily PRN Donne Hazel, MD      . heparin injection 5,000 Units  5,000 Units Subcutaneous 3 times per day Donne Hazel, MD   5,000 Units at 08/16/14 1533  . HYDROcodone-acetaminophen (NORCO/VICODIN) 5-325 MG per tablet 2 tablet  2 tablet Oral BID PRN Donne Hazel, MD   2 tablet at 08/16/14 1604  . metoprolol tartrate (LOPRESSOR) tablet 25 mg  25 mg Oral Q breakfast Donne Hazel, MD   25 mg at 08/16/14 1533  . morphine 2 MG/ML injection 2 mg  2 mg Intravenous Q4H PRN Donne Hazel, MD      . ondansetron [facility]) tablet 4 mg  4 mg Oral Q6H PRN Donne Hazel, MD       Or  . ondansetron [facility]) injection 4 mg  4 mg Intravenous Q6H PRN Donne Hazel, MD      . simvastatin (ZOCOR) tablet 20 mg  20 mg Oral QPM Donne Hazel, MD   20 mg at 08/16/14 1755  . [START ON 08/17/2014] tiotropium (SPIRIVA) inhalation capsule 18 mcg  18 mcg Inhalation Daily Donne Hazel, MD         Results for orders placed or performed during the hospital encounter of 08/16/14 (from the past 48 hour(s))  CBC with Differential/Platelet     Status: Abnormal   Collection Time: 08/16/14 10:40 AM  Result Value Ref Range   WBC 20.9 (H) 4.0 - 10.5 K/uL   RBC 4.56 4.22 - 5.81 MIL/uL   Hemoglobin 12.5 (L) 13.0 - 17.0 g/dL   HCT 37.5 (L) 39.0 - 52.0 %   MCV 82.2 78.0 - 100.0 fL   MCH 27.4 26.0 - 34.0 pg   MCHC 33.3 30.0 - 36.0 g/dL   RDW 14.7 11.5 - 15.5 %   Platelets 385 150 - 400 K/uL   Neutrophils Relative % 85 (H) 43 - 77 %   Lymphocytes  Relative 6 (L) 12 - 46 %   Monocytes Relative 8 3 - 12 %   Eosinophils Relative 1 0 - 5 %   Basophils Relative 0 0 - 1 %  Neutro Abs 17.7 (H) 1.7 - 7.7 K/uL   Lymphs Abs 1.3 0.7 - 4.0 K/uL   Monocytes Absolute 1.7 (H) 0.1 - 1.0 K/uL   Eosinophils Absolute 0.2 0.0 - 0.7 K/uL   Basophils Absolute 0.0 0.0 - 0.1 K/uL   WBC Morphology MILD LEFT SHIFT (1-5% METAS, OCC MYELO, OCC BANDS)   Comprehensive metabolic panel     Status: Abnormal   Collection Time: 08/16/14 10:40 AM  Result Value Ref Range   Sodium 136 135 - 145 mmol/L   Potassium 3.6 3.5 - 5.1 mmol/L   Chloride 103 96 - 112 mmol/L   CO2 19 19 - 32 mmol/L   Glucose, Bld 127 (H) 70 - 99 mg/dL   BUN 29 (H) 6 - 23 mg/dL   Creatinine, Ser 0.90 0.50 - 1.35 mg/dL   Calcium 9.0 8.4 - 10.5 mg/dL   Total Protein 7.2 6.0 - 8.3 g/dL   Albumin 2.8 (L) 3.5 - 5.2 g/dL   AST 25 0 - 37 U/L   ALT 26 0 - 53 U/L   Alkaline Phosphatase 177 (H) 39 - 117 U/L   Total Bilirubin 2.5 (H) 0.3 - 1.2 mg/dL   GFR calc non Af Amer 82 (L) >90 mL/min   GFR calc Af Amer >90 >90 mL/min    Comment: (NOTE) The eGFR has been calculated using the CKD EPI equation. This calculation has not been validated in all clinical situations. eGFR's persistently <90 mL/min signify possible Chronic Kidney Disease.    Anion gap 14 5 - 15  Lactic acid, plasma     Status: None   Collection Time: 08/16/14 12:18 PM  Result Value Ref Range   Lactic Acid, Venous 1.5 0.5 - 2.0 mmol/L  Urinalysis, Routine w reflex microscopic     Status: Abnormal   Collection Time: 08/16/14  1:18 PM  Result Value Ref Range   Color, Urine AMBER (A) YELLOW    Comment: BIOCHEMICALS MAY BE AFFECTED BY COLOR   APPearance CLEAR CLEAR   Specific Gravity, Urine 1.018 1.005 - 1.030   pH 6.0 5.0 - 8.0   Glucose, UA NEGATIVE NEGATIVE mg/dL   Hgb urine dipstick NEGATIVE NEGATIVE   Bilirubin Urine SMALL (A) NEGATIVE   Ketones, ur 15 (A) NEGATIVE mg/dL   Protein, ur NEGATIVE NEGATIVE mg/dL    Urobilinogen, UA 4.0 (H) 0.0 - 1.0 mg/dL   Nitrite NEGATIVE NEGATIVE   Leukocytes, UA NEGATIVE NEGATIVE    Comment: MICROSCOPIC NOT DONE ON URINES WITH NEGATIVE PROTEIN, BLOOD, LEUKOCYTES, NITRITE, OR GLUCOSE <1000 mg/dL.    Ct Abdomen Pelvis Wo Contrast  08/16/2014   CLINICAL DATA:  Two week history of diarrhea, nausea, and vomiting. Symptoms began shortly after recent colonoscopy. History of COPD, hypertension, and hernia.  EXAM: CT ABDOMEN AND PELVIS WITHOUT CONTRAST  TECHNIQUE: Multidetector CT imaging of the abdomen and pelvis was performed following the standard protocol without IV contrast.  COMPARISON:  03/30/2013  FINDINGS: Evaluation of lungs is limited due to motion artifact. There appears to be prominence of the pulmonary vascularity suggesting pulmonary vascular congestion. Coronary artery calcifications. Multiple calcified lymph nodes in the mediastinum and hila bilaterally.  Multiple stones are present in the gallbladder. There is inflammatory infiltration around the gallbladder with small amount of gas in the gallbladder wall. There is an eccentric fluid collection suggesting hematoma or abscess adjacent to the anterior gallbladder wall. Enlarged gallbladder displaces the hepatic flexure of the colon medially. Edema in the adjacent liver. Stones are present in  the common bile duct. No significant ductal dilatation. Appearance is likely due to acute gangrenous cholecystitis.  Unenhanced appearance of the liver, spleen, pancreas, adrenal glands, kidneys, inferior vena cava, and retroperitoneal lymph nodes is unremarkable. Calcification of the abdominal aorta without aneurysm. Stomach and small bowel are normal. No wall thickening or abnormal distention. Contrast material flows through to the colon without evidence of obstruction. Colon is diffusely contrast filled. There is inflammatory thickening of the hepatic flexure related to be adjacent gallbladder process appear otherwise, no focal  colonic wall thickening. No colonic distention. Contrast material flows through to the rectum. Scattered diverticula in the colon. No free air or free fluid in the abdomen.  Pelvis: Prostate gland is not enlarged. Vascular calcifications are present. Bladder wall is not thickened. Fat demonstrated in the inguinal canals. No pelvic mass or lymphadenopathy. No free or loculated pelvic fluid collections. The appendix is normal. Degenerative changes in the lumbar spine. No destructive bone lesions.  IMPRESSION: Severe inflammatory changes involving the gallbladder and pericholecystic tissues with gas in the gallbladder wall, membranous changes versus abscess, and surrounding inflammatory reaction involving adjacent colon and liver. Multiple stones in the gallbladder. Stones in the common bile duct. Changes are consistent with gangrenous cholecystitis. Surgical consultation is recommended.   Electronically Signed   By: Lucienne Capers M.D.   On: 08/16/2014 17:28   Dg Chest Port 1 View  08/16/2014   CLINICAL DATA:  Shortness of Breath  EXAM: PORTABLE CHEST - 1 VIEW  COMPARISON:  04/07/2014  FINDINGS: Cardiomediastinal silhouette is stable. Persistent streaky atelectasis or scarring left base. No segmental infiltrate or convincing pulmonary edema .  IMPRESSION: Persistent streaky atelectasis or scarring left base. No infiltrate or pulmonary edema.   Electronically Signed   By: Lahoma Crocker M.D.   On: 08/16/2014 10:54    Review of Systems  Constitutional: Positive for fever and malaise/fatigue. Negative for chills.  HENT: Negative.   Respiratory: Positive for shortness of breath and wheezing. Negative for hemoptysis and sputum production.   Cardiovascular: Negative for chest pain, palpitations and leg swelling.  Gastrointestinal: Positive for nausea, vomiting, abdominal pain and diarrhea. Negative for constipation, blood in stool and melena.  Genitourinary: Negative.   Musculoskeletal: Positive for back pain and  joint pain.  Neurological: Positive for weakness.  Psychiatric/Behavioral: The patient has insomnia.    Blood pressure 144/88, pulse 113, temperature 98.2 F (36.8 C), temperature source Oral, resp. rate 27, height 6' (1.829 m), weight 263 lb (119.296 kg), SpO2 96 %. Physical Exam General: Alert, Moderately obese Caucasian male who appears moderately ill, in no evere distress Skin: Warm and dry without rash or infection. HEENT: No palpable masses or thyromegaly. Sclera nonicteric. Pupils equal round and reactive. Oropharynx clear. Lymph nodes: No cervical, supraclavicular, or inguinal nodes palpable. Lungs: Mild bilateral wheezing. Minimal increased work of breathing. Cardiovascular: Regular rate and rhythm without murmur. No JVD or edema. Peripheral pulses intact. Abdomen: Moderately distended and obese. No tenderness elicited even with deep palpation No masses palpable. No organomegaly. No palpable hernias. Extremities: No edema or joint swelling or deformity. No chronic venous stasis changes. Neurologic: Alert and fully oriented. Gait normal.  Assessment/Plan: Ten-day illness consisting of nausea and vomiting, diarrhea, low-grade fever and some abdominal pain. CT scan is remarkable for evidence of severe cholecystitis with significant surrounding inflammatory change/phlegmon. The patient has some significant pulmonary comorbidity. With the severe nature of his cholecystitis and the fact he has been sick almost 2 weeks and in light of  his comorbidities I believe the best course at this point would be percutaneous drainage and initial nonoperative management. He currently is receiving appropriate IV antibiotics and fluids. I have discussed this with interventional radiology and placed an order for percutaneous cholecystostomy. This was discussed with the patient and his family including options of surgical intervention and they understand and agree with the plan.  Roshaun Pound  T 08/16/2014, 9:16 PM

## 2014-08-16 NOTE — Progress Notes (Signed)
ANTIBIOTIC CONSULT NOTE - INITIAL  Pharmacy Consult for Unasyn Indication: Intra-abdominal infection  Allergies  Allergen Reactions  . Statins Other (See Comments)    Muscle aches - tolerating Vytorin    Patient Measurements: Height: 6' (182.9 cm) Weight: 263 lb (119.296 kg) IBW/kg (Calculated) : 77.6  Vital Signs: Temp: 98.2 F (36.8 C) (02/23 1410) Temp Source: Oral (02/23 1410) BP: 144/88 mmHg (02/23 1531) Pulse Rate: 113 (02/23 1531) Intake/Output from previous day:   Intake/Output from this shift: Total I/O In: -  Out: 250 [Urine:250]  Labs:  Recent Labs  08/16/14 1040  WBC 20.9*  HGB 12.5*  PLT 385  CREATININE 0.90   Estimated Creatinine Clearance: 96 mL/min (by C-G formula based on Cr of 0.9). No results for input(s): VANCOTROUGH, VANCOPEAK, VANCORANDOM, GENTTROUGH, GENTPEAK, GENTRANDOM, TOBRATROUGH, TOBRAPEAK, TOBRARND, AMIKACINPEAK, AMIKACINTROU, AMIKACIN in the last 72 hours.   Microbiology: No results found for this or any previous visit (from the past 720 hour(s)).  Medical History: Past Medical History  Diagnosis Date  . 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  . Depression   . PTSD (post-traumatic stress disorder)   . OSA (obstructive sleep apnea)   . Migraine   . GERD (gastroesophageal reflux disease)   . Dyslipidemia   . Rhinitis   . Cervical disc disease   . Subdural hematoma 11/10    a. 2010 s/p surgery - diagnosed several weeks after a fall.  . Hypertension   . Coronary artery calcification seen on CAT scan   . Sarcoidosis   . Atherosclerosis   . Dyspnea     chronic  . Back pain     chronic back pain/under pain management  . Inguinal hernia     right side    Medications:  Scheduled:  . budesonide-formoterol  2 puff Inhalation BID  . finasteride  5 mg Oral QPM  . [START ON 08/17/2014] FLUoxetine  40 mg Oral Q breakfast  . heparin  5,000 Units Subcutaneous 3 times per day   . metoprolol tartrate  25 mg Oral Q breakfast  . simvastatin  20 mg Oral QPM  . [START ON 08/17/2014] tiotropium  18 mcg Inhalation Daily   Infusions:  . sodium chloride 100 mL/hr at 08/16/14 1407   PRN: acetaminophen **OR** acetaminophen, albuterol, gabapentin, HYDROcodone-acetaminophen, morphine injection, ondansetron **OR** ondansetron (ZOFRAN) IV  Assessment: 74 yo male presents with N/V/D x 2 weeks, starting after colonoscopy. CT of abd/pelivs ordered. Pharmacy was initially consulted to dose cipro for possible infectious colitis as patient acknowledges positive sick contacts.  Patient was started on Cipro 400mg  IV q12h and Flagyl 500mg  IV q8h, both last doses given ~ 15:30 today.  Evening update:  Cipro/Flagyl have been d/c'ed and pharmacy consulted to begin dosing of Unasyn for intra-abdominal infection  2/23 >> Cipro >> 2/23 2/23 >> Flagyl (MD) >> 2/23 2/23 >> Unasyn >>  Tmax: AF WBC: 20.9 Renal: SCr 0.9  2/23 blood: 2/23 stool: 2/23 Cdiff:  Goal of Therapy:  Dose appropriate for indication, renal function  Plan:   Unasyn 3gm IV q6h Follow up renal function & cultures  Leone Haven, PharmD  08/16/2014,5:55 PM

## 2014-08-17 ENCOUNTER — Inpatient Hospital Stay (HOSPITAL_COMMUNITY): Payer: Medicare Other

## 2014-08-17 DIAGNOSIS — K8000 Calculus of gallbladder with acute cholecystitis without obstruction: Secondary | ICD-10-CM | POA: Diagnosis present

## 2014-08-17 DIAGNOSIS — K8050 Calculus of bile duct without cholangitis or cholecystitis without obstruction: Secondary | ICD-10-CM | POA: Insufficient documentation

## 2014-08-17 DIAGNOSIS — E876 Hypokalemia: Secondary | ICD-10-CM | POA: Diagnosis not present

## 2014-08-17 DIAGNOSIS — K8001 Calculus of gallbladder with acute cholecystitis with obstruction: Secondary | ICD-10-CM

## 2014-08-17 DIAGNOSIS — A419 Sepsis, unspecified organism: Principal | ICD-10-CM

## 2014-08-17 DIAGNOSIS — J441 Chronic obstructive pulmonary disease with (acute) exacerbation: Secondary | ICD-10-CM | POA: Diagnosis not present

## 2014-08-17 DIAGNOSIS — I5032 Chronic diastolic (congestive) heart failure: Secondary | ICD-10-CM

## 2014-08-17 DIAGNOSIS — K810 Acute cholecystitis: Secondary | ICD-10-CM

## 2014-08-17 LAB — COMPREHENSIVE METABOLIC PANEL
ALBUMIN: 2.4 g/dL — AB (ref 3.5–5.2)
ALK PHOS: 130 U/L — AB (ref 39–117)
ALT: 22 U/L (ref 0–53)
ANION GAP: 12 (ref 5–15)
AST: 22 U/L (ref 0–37)
BUN: 21 mg/dL (ref 6–23)
CO2: 19 mmol/L (ref 19–32)
Calcium: 8.5 mg/dL (ref 8.4–10.5)
Chloride: 109 mmol/L (ref 96–112)
Creatinine, Ser: 0.8 mg/dL (ref 0.50–1.35)
GFR calc Af Amer: >90 mL/min (ref >90)
GFR calc non Af Amer: 86 mL/min — ABNORMAL LOW (ref >90)
Glucose, Bld: 101 mg/dL — ABNORMAL HIGH (ref 70–99)
Potassium: 3.1 mmol/L — ABNORMAL LOW (ref 3.5–5.1)
Sodium: 140 mmol/L (ref 135–145)
Total Bilirubin: 1.8 mg/dL — ABNORMAL HIGH (ref 0.3–1.2)
Total Protein: 6.2 g/dL (ref 6.0–8.3)

## 2014-08-17 LAB — CBC
HEMATOCRIT: 34.9 % — AB (ref 39.0–52.0)
Hemoglobin: 11.4 g/dL — ABNORMAL LOW (ref 13.0–17.0)
MCH: 27.3 pg (ref 26.0–34.0)
MCHC: 32.7 g/dL (ref 30.0–36.0)
MCV: 83.5 fL (ref 78.0–100.0)
PLATELETS: 316 10*3/uL (ref 150–400)
RBC: 4.18 MIL/uL — AB (ref 4.22–5.81)
RDW: 14.7 % (ref 11.5–15.5)
WBC: 16.2 10*3/uL — ABNORMAL HIGH (ref 4.0–10.5)

## 2014-08-17 LAB — CLOSTRIDIUM DIFFICILE BY PCR: Toxigenic C. Difficile by PCR: NEGATIVE

## 2014-08-17 LAB — MAGNESIUM: Magnesium: 1.6 mg/dL (ref 1.5–2.5)

## 2014-08-17 MED ORDER — FENTANYL CITRATE 0.05 MG/ML IJ SOLN
INTRAMUSCULAR | Status: AC
  Filled 2014-08-17: qty 4

## 2014-08-17 MED ORDER — MIDAZOLAM HCL 2 MG/2ML IJ SOLN
INTRAMUSCULAR | Status: AC
  Filled 2014-08-17: qty 6

## 2014-08-17 MED ORDER — PIPERACILLIN-TAZOBACTAM 3.375 G IVPB
3.3750 g | Freq: Three times a day (TID) | INTRAVENOUS | Status: DC
Start: 2014-08-17 — End: 2014-08-25
  Administered 2014-08-17 – 2014-08-25 (×24): 3.375 g via INTRAVENOUS
  Filled 2014-08-17 (×27): qty 50

## 2014-08-17 MED ORDER — LORAZEPAM 2 MG/ML IJ SOLN
0.5000 mg | Freq: Once | INTRAMUSCULAR | Status: AC
  Administered 2014-08-17: 0.5 mg via INTRAVENOUS
  Filled 2014-08-17: qty 1

## 2014-08-17 MED ORDER — ALBUTEROL SULFATE (2.5 MG/3ML) 0.083% IN NEBU
2.5000 mg | INHALATION_SOLUTION | Freq: Four times a day (QID) | RESPIRATORY_TRACT | Status: DC
  Administered 2014-08-17: 2.5 mg via RESPIRATORY_TRACT
  Filled 2014-08-17: qty 3

## 2014-08-17 MED ORDER — TRAZODONE HCL 100 MG PO TABS
100.0000 mg | ORAL_TABLET | Freq: Every day | ORAL | Status: DC
  Administered 2014-08-17 – 2014-08-24 (×8): 100 mg via ORAL
  Filled 2014-08-17 (×6): qty 1
  Filled 2014-08-17: qty 2
  Filled 2014-08-17: qty 1
  Filled 2014-08-17: qty 2
  Filled 2014-08-17 (×2): qty 1
  Filled 2014-08-17: qty 2
  Filled 2014-08-17: qty 1

## 2014-08-17 MED ORDER — POTASSIUM CHLORIDE CRYS ER 20 MEQ PO TBCR
40.0000 meq | EXTENDED_RELEASE_TABLET | ORAL | Status: AC
  Administered 2014-08-17: 40 meq via ORAL
  Filled 2014-08-17 (×2): qty 2

## 2014-08-17 MED ORDER — FENTANYL CITRATE 0.05 MG/ML IJ SOLN
INTRAMUSCULAR | Status: AC | PRN
  Administered 2014-08-17: 50 ug via INTRAVENOUS

## 2014-08-17 MED ORDER — IPRATROPIUM BROMIDE 0.02 % IN SOLN
0.5000 mg | RESPIRATORY_TRACT | Status: DC
  Administered 2014-08-17 – 2014-08-19 (×10): 0.5 mg via RESPIRATORY_TRACT
  Filled 2014-08-17 (×10): qty 2.5

## 2014-08-17 MED ORDER — METHYLPREDNISOLONE SODIUM SUCC 125 MG IJ SOLR
125.0000 mg | Freq: Three times a day (TID) | INTRAMUSCULAR | Status: DC
  Administered 2014-08-17 – 2014-08-20 (×10): 125 mg via INTRAVENOUS
  Filled 2014-08-17 (×12): qty 2

## 2014-08-17 MED ORDER — LIP MEDEX EX OINT
TOPICAL_OINTMENT | CUTANEOUS | Status: AC
  Administered 2014-08-17: 14:00:00
  Filled 2014-08-17: qty 7

## 2014-08-17 MED ORDER — LEVALBUTEROL HCL 0.63 MG/3ML IN NEBU
0.6300 mg | INHALATION_SOLUTION | RESPIRATORY_TRACT | Status: DC
Start: 2014-08-17 — End: 2014-08-19
  Administered 2014-08-17 – 2014-08-19 (×10): 0.63 mg via RESPIRATORY_TRACT
  Filled 2014-08-17 (×10): qty 3

## 2014-08-17 MED ORDER — IPRATROPIUM BROMIDE 0.02 % IN SOLN: 0.5000 mg | RESPIRATORY_TRACT | Status: DC | PRN

## 2014-08-17 MED ORDER — LORAZEPAM 2 MG/ML IJ SOLN
0.5000 mg | Freq: Three times a day (TID) | INTRAMUSCULAR | Status: DC | PRN
  Administered 2014-08-17 – 2014-08-23 (×7): 0.5 mg via INTRAVENOUS
  Filled 2014-08-17 (×7): qty 1

## 2014-08-17 MED ORDER — LEVALBUTEROL HCL 0.63 MG/3ML IN NEBU
0.6300 mg | INHALATION_SOLUTION | RESPIRATORY_TRACT | Status: DC | PRN
  Administered 2014-08-19: 0.63 mg via RESPIRATORY_TRACT
  Filled 2014-08-17 (×2): qty 3

## 2014-08-17 MED ORDER — INDOMETHACIN 50 MG RE SUPP
100.0000 mg | Freq: Once | RECTAL | Status: DC
  Filled 2014-08-17: qty 2

## 2014-08-17 MED ORDER — IOHEXOL 300 MG/ML  SOLN
10.0000 mL | Freq: Once | INTRAMUSCULAR | Status: AC | PRN
  Administered 2014-08-17: 10 mL

## 2014-08-17 MED ORDER — TRAMADOL HCL 50 MG PO TABS
100.0000 mg | ORAL_TABLET | Freq: Three times a day (TID) | ORAL | Status: DC
Start: 2014-08-17 — End: 2014-08-25
  Administered 2014-08-17 – 2014-08-25 (×25): 100 mg via ORAL
  Filled 2014-08-17 (×25): qty 2

## 2014-08-17 MED ORDER — LIDOCAINE HCL 1 % IJ SOLN
INTRAMUSCULAR | Status: AC
Start: 2014-08-17 — End: 2014-08-17
  Filled 2014-08-17: qty 20

## 2014-08-17 NOTE — Consult Note (Signed)
Consultation  Referring Provider:  [facility]    Primary Care Physician:  Jerlyn Ly, MD Primary Gastroenterologist:  Scarlette Shorts, MD       Reason for Consultation: Common bile duct stone.             HPI:   Travis Ferguson is a 74 y.o. male who presented to ED yesterday with SOB, nausea, vomiting, and back pain. Wife reported recent onset of confusion which is why she really brought him to ED. Last weeks nausea, vomiting and diarrhea were felt to be "a bug". In ED non-contrast CTscan suggested gangrenous cholecystitis  / cholelithiasis as well as CBD stones.  Surgery evaluated and felt that patient would be best served with a percutaneous drain given his significant pulmonary problems.   Patient really hasn't had much abdominal pain, just soreness when bending over. He has lower back and leg pain which is chronic. Patient became confused yesterday, temp 99 per wife.   Past Medical History  Diagnosis Date  . 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  . Depression   . PTSD (post-traumatic stress disorder)   . OSA (obstructive sleep apnea)   . Migraine   . GERD (gastroesophageal reflux disease)   . Dyslipidemia   . Rhinitis   . Cervical disc disease   . Subdural hematoma 11/10    a. 2010 s/p surgery - diagnosed several weeks after a fall.  . Hypertension   . Coronary artery calcification seen on CAT scan   . Sarcoidosis   . Atherosclerosis   . Dyspnea     chronic  . Back pain     chronic back pain/under pain management  . Inguinal hernia     right side    Past Surgical History  Procedure Laterality Date  . Craniotomy  11/10    right frontal  . Video bronchoscopy  07/28/2012    Procedure: VIDEO BRONCHOSCOPY WITH FLUORO;  Surgeon: Chesley Mires, MD;  Location: [facility];  Service: Cardiopulmonary;  Laterality: Bilateral;  . Hernia repair Bilateral     right and left inguinal  . Knee arthroscopy Right   .  Lumbar laminectomy/decompression microdiscectomy Right 12/15/2012    Procedure: LUMBAR LAMINECTOMY/DECOMPRESSION MICRODISCECTOMY 1 LEVEL;  Surgeon: Elaina Hoops, MD;  Location: [place] [facility];  Service: Neurosurgery;  Laterality: Right;  Right Lumbar four-five laminectomy/foraminotomy    Family History  Problem Relation Age of Onset  . Coronary artery disease    . Colon cancer Neg Hx   . Pancreatic cancer Neg Hx   . Rectal cancer Neg Hx   . Esophageal cancer Neg Hx   . Stomach cancer Paternal Grandmother   . Heart disease Mother   . Heart disease Father   . Colon polyps Brother   . Congestive Heart Failure Brother      History  Substance Use Topics  . Smoking status: Former Smoker -- 0.00 packs/day for 25 years    Types: Pipe    Quit date: 06/25/1995  . Smokeless tobacco: Never Used  . Alcohol Use: No    Prior to Admission medications   Medication Sig Start Date End Date Taking? Authorizing Provider  albuterol (PROVENTIL) (2.5 MG/3ML) 0.083% nebulizer solution Take 2.5 mg by nebulization every 6 (six) hours as needed for wheezing or shortness of breath.   Yes Historical Provider, MD  budesonide-formoterol (SYMBICORT) 160-4.5 MCG/ACT inhaler Inhale 2 puffs into the lungs 2 (  two) times daily. 05/11/12  Yes Tammy S Parrett, NP  finasteride (PROSCAR) 5 MG tablet Take 1 tablet by mouth every evening.  01/07/13  Yes Historical Provider, MD  FLUoxetine (PROZAC) 40 MG capsule Take 40 mg by mouth daily with breakfast.    Yes Historical Provider, MD  gabapentin (NEURONTIN) 300 MG capsule Take 900 mg by mouth daily as needed (for nerve pain).    Yes Historical Provider, MD  HYDROcodone-acetaminophen (NORCO/VICODIN) 5-325 MG per tablet Take 2 tablets by mouth 2 (two) times daily as needed (for pain).    Yes Historical Provider, MD  losartan-hydrochlorothiazide (HYZAAR) 50-12.5 MG per tablet Take 0.5 tablets by mouth daily with breakfast.  03/23/12  Yes Historical Provider, MD  metoprolol tartrate  (LOPRESSOR) 25 MG tablet Take 25 mg by mouth daily with breakfast.  04/06/12  Yes Historical Provider, MD  PROAIR HFA 108 (90 BASE) MCG/ACT inhaler 1-2 PUFFS EVERY 4-6 HOURS AS NEEDED Patient taking differently: Inhale 1-2 puffs every 4 to 6 hours as needed for shortness of breath 02/21/14  Yes Chesley Mires, MD  simvastatin (ZOCOR) 20 MG tablet Take 1 tablet by mouth every evening.  04/10/14  Yes Historical Provider, MD  tiotropium (SPIRIVA) 18 MCG inhalation capsule Place 1 capsule (18 mcg total) into inhaler and inhale daily. 01/28/12  Yes Chesley Mires, MD    Current Facility-Administered Medications  Medication Dose Route Frequency Provider Last Rate Last Dose  . 0.9 %  sodium chloride infusion   Intravenous Continuous Donne Hazel, MD 100 mL/hr at 08/17/14 0509 1,000 mL at 08/17/14 0509  . acetaminophen (TYLENOL) tablet 650 mg  650 mg Oral Q6H PRN Donne Hazel, MD       Or  . acetaminophen (TYLENOL) suppository 650 mg  650 mg Rectal Q6H PRN Donne Hazel, MD      . albuterol (PROVENTIL) (2.5 MG/3ML) 0.083% nebulizer solution 2.5 mg  2.5 mg Nebulization Q6H Eugenie Filler, MD      . budesonide-formoterol [facility]) 160-4.5 MCG/ACT inhaler 2 puff  2 puff Inhalation BID Donne Hazel, MD   2 puff at 08/17/14 0909  . fentaNYL (SUBLIMAZE) injection   Intravenous PRN Art A Hoss, MD   50 mcg at 08/17/14 1221  . finasteride (PROSCAR) tablet 5 mg  5 mg Oral QPM Donne Hazel, MD   5 mg at 08/16/14 1755  . FLUoxetine (PROZAC) capsule 40 mg  40 mg Oral Q breakfast Donne Hazel, MD      . gabapentin (NEURONTIN) capsule 900 mg  900 mg Oral Daily PRN Donne Hazel, MD      . heparin injection 5,000 Units  5,000 Units Subcutaneous 3 times per day Donne Hazel, MD   5,000 Units at 08/17/14 [phone]  . HYDROcodone-acetaminophen (NORCO/VICODIN) 5-325 MG per tablet 2 tablet  2 tablet Oral BID PRN Donne Hazel, MD   2 tablet at 08/16/14 1604  . lidocaine (XYLOCAINE) 1 % (with pres) injection             . metoprolol tartrate (LOPRESSOR) tablet 25 mg  25 mg Oral Q breakfast Donne Hazel, MD   25 mg at 08/17/14 0849  . morphine 2 MG/ML injection 2 mg  2 mg Intravenous Q4H PRN Donne Hazel, MD      . ondansetron [facility]) tablet 4 mg  4 mg Oral Q6H PRN Donne Hazel, MD       Or  . ondansetron [facility]) injection 4 mg  4 mg Intravenous Q6H PRN Donne Hazel, MD      . piperacillin-tazobactam (ZOSYN) IVPB 3.375 g  3.375 g Intravenous 3 times per day Jackolyn Confer, MD   3.375 g at 08/17/14 1209  . potassium chloride SA (K-DUR,KLOR-CON) CR tablet 40 mEq  40 mEq Oral Q4H Eugenie Filler, MD   Stopped at 08/17/14 1021  . simvastatin (ZOCOR) tablet 20 mg  20 mg Oral QPM Donne Hazel, MD   20 mg at 08/16/14 1755  . tiotropium (SPIRIVA) inhalation capsule 18 mcg  18 mcg Inhalation Daily Donne Hazel, MD   18 mcg at 08/17/14 1054    Allergies as of 08/16/2014 - Review Complete 08/16/2014  Allergen Reaction Noted  . Statins Other (See Comments) 04/26/2013    Review of Systems:    All systems reviewed and negative except where noted in HPI.   Physical Exam:  Vital signs in last 24 hours: Temp:  [98.1 F (36.7 C)-98.2 F (36.8 C)] 98.1 F (36.7 C) (02/24 0627) Pulse Rate:  [78-113] 103 (02/24 1222) Resp:  [22-26] 26 (02/24 1222) BP: (136-157)/(72-93) 143/91 mmHg (02/24 1222) SpO2:  [92 %-98 %] 96 % (02/24 1222) Weight:  [263 lb (119.296 kg)] 263 lb (119.296 kg) (02/23 1500) Last BM Date: 08/16/14 General:   Pleasant white male in NAD Head:  Normocephalic and atraumatic. Eyes:   No icterus.   Conjunctiva pink. Ears:  Normal auditory acuity. Neck:  Supple; no masses felt Lungs:  Slightly SOB with conversation. On 02 per 2 liters.  Respirations even and unlabored. Significant wheezing in chest.  Heart:  Slightly tachycardic Abdomen:  Soft, nondistended, nontender. Hypoactive bowel sounds. Perc drain >>>purulent brown output. No appreciable masses Rectal:  Not performed.  Msk:   Symmetrical without gross deformities.  Extremities:  Without edema. Neurologic:  Alert and  oriented x4;  grossly normal neurologically. Skin:  Intact without significant lesions or rashes. Cervical Nodes:  No significant cervical adenopathy. Psych:  Alert and cooperative. Normal affect.  LAB RESULTS:  Recent Labs  08/16/14 1040 08/17/14 0545  WBC 20.9* 16.2*  HGB 12.5* 11.4*  HCT 37.5* 34.9*  PLT 385 316   BMET  Recent Labs  08/16/14 1040 08/17/14 0545  NA 136 140  K 3.6 3.1*  CL 103 109  CO2 19 19  GLUCOSE 127* 101*  BUN 29* 21  CREATININE 0.90 0.80  CALCIUM 9.0 8.5   LFT  Recent Labs  08/17/14 0545  PROT 6.2  ALBUMIN 2.4*  AST 22  ALT 22  ALKPHOS 130*  BILITOT 1.8*    STUDIES: Ct Abdomen Pelvis Wo Contrast  08/16/2014   CLINICAL DATA:  Two week history of diarrhea, nausea, and vomiting. Symptoms began shortly after recent colonoscopy. History of COPD, hypertension, and hernia.  EXAM: CT ABDOMEN AND PELVIS WITHOUT CONTRAST  TECHNIQUE: Multidetector CT imaging of the abdomen and pelvis was performed following the standard protocol without IV contrast.  COMPARISON:  03/30/2013  FINDINGS: Evaluation of lungs is limited due to motion artifact. There appears to be prominence of the pulmonary vascularity suggesting pulmonary vascular congestion. Coronary artery calcifications. Multiple calcified lymph nodes in the mediastinum and hila bilaterally.  Multiple stones are present in the gallbladder. There is inflammatory infiltration around the gallbladder with small amount of gas in the gallbladder wall. There is an eccentric fluid collection suggesting hematoma or abscess adjacent to the anterior gallbladder wall. Enlarged gallbladder displaces the hepatic flexure of the colon medially. Edema in the adjacent  liver. Stones are present in the common bile duct. No significant ductal dilatation. Appearance is likely due to acute gangrenous cholecystitis.  Unenhanced appearance  of the liver, spleen, pancreas, adrenal glands, kidneys, inferior vena cava, and retroperitoneal lymph nodes is unremarkable. Calcification of the abdominal aorta without aneurysm. Stomach and small bowel are normal. No wall thickening or abnormal distention. Contrast material flows through to the colon without evidence of obstruction. Colon is diffusely contrast filled. There is inflammatory thickening of the hepatic flexure related to be adjacent gallbladder process appear otherwise, no focal colonic wall thickening. No colonic distention. Contrast material flows through to the rectum. Scattered diverticula in the colon. No free air or free fluid in the abdomen.  Pelvis: Prostate gland is not enlarged. Vascular calcifications are present. Bladder wall is not thickened. Fat demonstrated in the inguinal canals. No pelvic mass or lymphadenopathy. No free or loculated pelvic fluid collections. The appendix is normal. Degenerative changes in the lumbar spine. No destructive bone lesions.  IMPRESSION: Severe inflammatory changes involving the gallbladder and pericholecystic tissues with gas in the gallbladder wall, membranous changes versus abscess, and surrounding inflammatory reaction involving adjacent colon and liver. Multiple stones in the gallbladder. Stones in the common bile duct. Changes are consistent with gangrenous cholecystitis. Surgical consultation is recommended.   Electronically Signed   By: Lucienne Capers M.D.   On: 08/16/2014 17:28   Dg Chest Port 1 View  08/16/2014   CLINICAL DATA:  Shortness of Breath  EXAM: PORTABLE CHEST - 1 VIEW  COMPARISON:  04/07/2014  FINDINGS: Cardiomediastinal silhouette is stable. Persistent streaky atelectasis or scarring left base. No segmental infiltrate or convincing pulmonary edema .  IMPRESSION: Persistent streaky atelectasis or scarring left base. No infiltrate or pulmonary edema.   Electronically Signed   By: Lahoma Crocker M.D.   On: 08/16/2014 10:54     PREVIOUS ENDOSCOPIES:            screening colonoscopy Feb 2016 - polyps, severe diverticular disease, non-bleeding AVMs ascending colon and cecum.    Impression / Plan:   52. 74 year old male with cholecystitis, s/p percutaneous drain placement this am (purulent). On Zosyn. WBC down from 20 to 16 now.   2. Choledocholithiasis, mild cholestasis by labs, no duct dilation of imaging. Will need ERCP but concerned about pulmonary status. He has underlying COPD and is having significant wheezing right now.  Getting Nebulizer treatments. I talked with Hospitalist who will try and tune up his lungs for procedure.  3. COPD / sarcoidosis, followed by [facility].   Thanks   LOS: 1 day   Tye Savoy  08/17/2014, 12:26 PM

## 2014-08-17 NOTE — Procedures (Signed)
10 Fr Chole drain Pus No comp

## 2014-08-17 NOTE — Progress Notes (Signed)
[facility] PROGRESS NOTE  Travis Ferguson WTU:[id number] DOB: December 10, 1940 DOA: 08/16/2014 PCP: Jerlyn Ly, MD  Assessment/Plan: #1 sepsis Secondary to gangrenous cholecystitis noted on CT of the abdomen and pelvis. Patient met criteria for sepsis secondary to tachycardia, leukocytosis, increased respiratory rate. C. difficile PCR was negative. Blood cultures are pending. Patient is status post percutaneous cholecystostomy drain with fluid cultures pending. Continue empiric IV Zosyn. General surgery and GI following.  #2 gangrenous cholecystitis Due to patient's comorbidities and pulmonary status was recommended by general surgery that patient undergo percutaneous cholecystostomy which was done today per interventional radiology. Body fluid cultures are pending. Patient has been pancultured. Continue empiric IV Zosyn. Follow.  #3 acute COPD exacerbation Patient with both expiratory and inspiratory wheezing on examination. Will place on IV steroids, scheduled nebulizers, oxygen, pulmonary toilet. If no significant improvement we'll consult with pulmonary.  #4 choledocholithiasis per CT of the abdomen and pelvis.  Patient will need an ERCP for common bile duct stone retrieval per GI once his pulmonary status has improved. GI following and appreciate input and recommendations.  #5 history of diastolic CHF Stable. Follow.  #6 hypertension Stable.  #7 hyperlipidemia Continue statin.  #8 prophylaxis Heparin for DVT prophylaxis.   Code Status: Full Family Communication: Updated patient, brother, wife at bedside. Disposition Plan: Remain inpatient.   Consultants:  Gen. surgery: Dr. Hunt Oris 08/16/2014  Gastroenterology: Dr. Fuller Plan 08/17/2014  Interventional radiology: Dr. Barbie Banner 08/17/2014  Procedures:  CT abdomen and pelvis 08/16/2014  Chest x-ray 08/16/2014  Status post percutaneous cholecystostomy 08/17/2014 per Dr. Juel Burrow  Antibiotics:  IV Zosyn 08/17/2014  IV  Flagyl 08/16/2014>>>>> 08/16/2014  IV ciprofloxacin 08/16/2014>>> 08/16/2014  IV Unasyn 08/16/2014>>>> 08/17/2014  HPI/Subjective: Patient complaining of being uncomfortable. Patient complaining of pain around drain site.  Objective: Filed Vitals:   08/17/14 1319  BP:   Pulse:   Temp:   Resp: 26    Intake/Output Summary (Last 24 hours) at 08/17/14 1400 Last data filed at 08/17/14 1000  Gross per 24 hour  Intake 1688.33 ml  Output   1226 ml  Net 462.33 ml   Filed Weights   08/16/14 1500  Weight: 119.296 kg (263 lb)    Exam:   General:  NAD  Cardiovascular: RRR  Respiratory: Inspiratory and expiratory wheezing. No crackles.  Abdomen: Soft, nontender, nondistended, positive bowel sounds. Percutaneous drain with purulent brown output.  Musculoskeletal: No clubbing cyanosis or edema.  Data Reviewed: Basic Metabolic Panel:  Recent Labs Lab 08/16/14 1040 08/17/14 0545 08/17/14 0856  NA 136 140  --   K 3.6 3.1*  --   CL 103 109  --   CO2 19 19  --   GLUCOSE 127* 101*  --   BUN 29* 21  --   CREATININE 0.90 0.80  --   CALCIUM 9.0 8.5  --   MG  --   --  1.6   Liver Function Tests:  Recent Labs Lab 08/16/14 1040 08/17/14 0545  AST 25 22  ALT 26 22  ALKPHOS 177* 130*  BILITOT 2.5* 1.8*  PROT 7.2 6.2  ALBUMIN 2.8* 2.4*   No results for input(s): LIPASE, AMYLASE in the last 168 hours. No results for input(s): AMMONIA in the last 168 hours. CBC:  Recent Labs Lab 08/16/14 1040 08/17/14 0545  WBC 20.9* 16.2*  NEUTROABS 17.7*  --   HGB 12.5* 11.4*  HCT 37.5* 34.9*  MCV 82.2 83.5  PLT 385 316   Cardiac Enzymes: No results for input(s): CKTOTAL, CKMB,  CKMBINDEX, TROPONINI in the last 168 hours. BNP (last 3 results) No results for input(s): BNP in the last 8760 hours.  ProBNP (last 3 results)  Recent Labs  03/17/14 1305  PROBNP 235.0*    CBG: No results for input(s): GLUCAP in the last 168 hours.  Recent Results (from the past 240  hour(s))  Blood culture (routine x 2)     Status: None (Preliminary result)   Collection Time: 08/16/14 12:27 PM  Result Value Ref Range Status   Specimen Description BLOOD RIGHT HAND  Final   Special Requests   Final    BOTTLES DRAWN AEROBIC AND ANAEROBIC 5CC BOTH BOTTLES   Culture   Final           BLOOD CULTURE RECEIVED NO GROWTH TO DATE CULTURE WILL BE HELD FOR 5 DAYS BEFORE ISSUING A FINAL NEGATIVE REPORT Performed at [facility]    Report Status PENDING  Incomplete  Blood culture (routine x 2)     Status: None (Preliminary result)   Collection Time: 08/16/14 12:27 PM  Result Value Ref Range Status   Specimen Description BLOOD RIGHT ARM  Final   Special Requests   Final    BOTTLES DRAWN AEROBIC AND ANAEROBIC 4CC BOTH BOTTLES   Culture   Final           BLOOD CULTURE RECEIVED NO GROWTH TO DATE CULTURE WILL BE HELD FOR 5 DAYS BEFORE ISSUING A FINAL NEGATIVE REPORT Performed at [facility]    Report Status PENDING  Incomplete  Clostridium Difficile by PCR     Status: None   Collection Time: 08/16/14  3:11 PM  Result Value Ref Range Status   C difficile by pcr NEGATIVE NEGATIVE Final    Comment: Performed at [facility]     Studies: Ct Abdomen Pelvis Wo Contrast  08/16/2014   CLINICAL DATA:  Two week history of diarrhea, nausea, and vomiting. Symptoms began shortly after recent colonoscopy. History of COPD, hypertension, and hernia.  EXAM: CT ABDOMEN AND PELVIS WITHOUT CONTRAST  TECHNIQUE: Multidetector CT imaging of the abdomen and pelvis was performed following the standard protocol without IV contrast.  COMPARISON:  03/30/2013  FINDINGS: Evaluation of lungs is limited due to motion artifact. There appears to be prominence of the pulmonary vascularity suggesting pulmonary vascular congestion. Coronary artery calcifications. Multiple calcified lymph nodes in the mediastinum and hila bilaterally.  Multiple stones are present in the gallbladder. There is  inflammatory infiltration around the gallbladder with small amount of gas in the gallbladder wall. There is an eccentric fluid collection suggesting hematoma or abscess adjacent to the anterior gallbladder wall. Enlarged gallbladder displaces the hepatic flexure of the colon medially. Edema in the adjacent liver. Stones are present in the common bile duct. No significant ductal dilatation. Appearance is likely due to acute gangrenous cholecystitis.  Unenhanced appearance of the liver, spleen, pancreas, adrenal glands, kidneys, inferior vena cava, and retroperitoneal lymph nodes is unremarkable. Calcification of the abdominal aorta without aneurysm. Stomach and small bowel are normal. No wall thickening or abnormal distention. Contrast material flows through to the colon without evidence of obstruction. Colon is diffusely contrast filled. There is inflammatory thickening of the hepatic flexure related to be adjacent gallbladder process appear otherwise, no focal colonic wall thickening. No colonic distention. Contrast material flows through to the rectum. Scattered diverticula in the colon. No free air or free fluid in the abdomen.  Pelvis: Prostate gland is not enlarged. Vascular calcifications are present. Bladder  wall is not thickened. Fat demonstrated in the inguinal canals. No pelvic mass or lymphadenopathy. No free or loculated pelvic fluid collections. The appendix is normal. Degenerative changes in the lumbar spine. No destructive bone lesions.  IMPRESSION: Severe inflammatory changes involving the gallbladder and pericholecystic tissues with gas in the gallbladder wall, membranous changes versus abscess, and surrounding inflammatory reaction involving adjacent colon and liver. Multiple stones in the gallbladder. Stones in the common bile duct. Changes are consistent with gangrenous cholecystitis. Surgical consultation is recommended.   Electronically Signed   By: Lucienne Capers M.D.   On: 08/16/2014 17:28    Dg Chest Port 1 View  08/16/2014   CLINICAL DATA:  Shortness of Breath  EXAM: PORTABLE CHEST - 1 VIEW  COMPARISON:  04/07/2014  FINDINGS: Cardiomediastinal silhouette is stable. Persistent streaky atelectasis or scarring left base. No segmental infiltrate or convincing pulmonary edema .  IMPRESSION: Persistent streaky atelectasis or scarring left base. No infiltrate or pulmonary edema.   Electronically Signed   By: Lahoma Crocker M.D.   On: 08/16/2014 10:54    Scheduled Meds: . budesonide-formoterol  2 puff Inhalation BID  . finasteride  5 mg Oral QPM  . FLUoxetine  40 mg Oral Q breakfast  . heparin  5,000 Units Subcutaneous 3 times per day  . ipratropium  0.5 mg Nebulization Q4H  . levalbuterol  0.63 mg Nebulization Q4H  . lidocaine      . lip balm      . methylPREDNISolone (SOLU-MEDROL) injection  125 mg Intravenous 3 times per day  . metoprolol tartrate  25 mg Oral Q breakfast  . piperacillin-tazobactam (ZOSYN)  IV  3.375 g Intravenous 3 times per day  . potassium chloride  40 mEq Oral Q4H  . simvastatin  20 mg Oral QPM  . traMADol  100 mg Oral 3 times per day   Continuous Infusions: . sodium chloride 1,000 mL (08/17/14 0509)    Principal Problem:   Sepsis Active Problems:   Gangrenous cholecystitis   Cholecystitis, acute with cholelithiasis   COPD exacerbation   COPD with chronic bronchitis   Essential hypertension   Hyperlipidemia   Diastolic CHF   Hypokalemia    Time spent: 25 minutes    Paddy Walthall M.D. [facility] Pager [phone]. If 7PM-7AM, please contact night-coverage at [email or web address], password [facility] 08/17/2014, 2:00 PM  LOS: 1 day

## 2014-08-17 NOTE — Consult Note (Signed)
Reason for consult:   Referring Physician(s): CCS  History of Present Illness: Travis Ferguson is a 74 y.o. male with history of COPD, CHF recently admitted with 2 week history of nausea/vomiting/diarrhea followed most recently by mild temperature elevation, slight worsening of dyspnea and right upper quadrant greater than right lower quadrant discomfort as well as weakness and orange urine. Laboratory values revealed leukocytosis and elevated total bilirubin of 1.8. Subsequent CT scan of the abdomen revealed severe inflammatory changes involving the gallbladder and pericholecystic tissues with gas and the gallbladder wall, membranous change versus abscess and surrounding inflammatory reaction involving adjacent colon and liver. There are multiple stones in the gallbladder as well as the common bile duct. Findings were consistent with gangrenous cholecystitis. Patient was seen by surgery and deemed poor candidate for cholecystectomy secondary to comorbidities. Request is now received for percutaneous cholecystostomy.  Past Medical History  Diagnosis Date  . 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  . Depression   . PTSD (post-traumatic stress disorder)   . OSA (obstructive sleep apnea)   . Migraine   . GERD (gastroesophageal reflux disease)   . Dyslipidemia   . Rhinitis   . Cervical disc disease   . Subdural hematoma 11/10    a. 2010 s/p surgery - diagnosed several weeks after a fall.  . Hypertension   . Coronary artery calcification seen on CAT scan   . Sarcoidosis   . Atherosclerosis   . Dyspnea     chronic  . Back pain     chronic back pain/under pain management  . Inguinal hernia     right side    Past Surgical History  Procedure Laterality Date  . Craniotomy  11/10    right frontal  . Video bronchoscopy  07/28/2012    Procedure: VIDEO BRONCHOSCOPY WITH FLUORO;  Surgeon: Chesley Mires, MD;  Location: [facility];   Service: Cardiopulmonary;  Laterality: Bilateral;  . Hernia repair Bilateral     right and left inguinal  . Knee arthroscopy Right   . Lumbar laminectomy/decompression microdiscectomy Right 12/15/2012    Procedure: LUMBAR LAMINECTOMY/DECOMPRESSION MICRODISCECTOMY 1 LEVEL;  Surgeon: Elaina Hoops, MD;  Location: [place] [facility];  Service: Neurosurgery;  Laterality: Right;  Right Lumbar four-five laminectomy/foraminotomy    Allergies: Statins  Medications: Prior to Admission medications   Medication Sig Start Date End Date Taking? Authorizing Provider  albuterol (PROVENTIL) (2.5 MG/3ML) 0.083% nebulizer solution Take 2.5 mg by nebulization every 6 (six) hours as needed for wheezing or shortness of breath.   Yes Historical Provider, MD  budesonide-formoterol (SYMBICORT) 160-4.5 MCG/ACT inhaler Inhale 2 puffs into the lungs 2 (two) times daily. 05/11/12  Yes Tammy S Parrett, NP  finasteride (PROSCAR) 5 MG tablet Take 1 tablet by mouth every evening.  01/07/13  Yes Historical Provider, MD  FLUoxetine (PROZAC) 40 MG capsule Take 40 mg by mouth daily with breakfast.    Yes Historical Provider, MD  gabapentin (NEURONTIN) 300 MG capsule Take 900 mg by mouth daily as needed (for nerve pain).    Yes Historical Provider, MD  HYDROcodone-acetaminophen (NORCO/VICODIN) 5-325 MG per tablet Take 2 tablets by mouth 2 (two) times daily as needed (for pain).    Yes Historical Provider, MD  losartan-hydrochlorothiazide (HYZAAR) 50-12.5 MG per tablet Take 0.5 tablets by mouth daily with breakfast.  03/23/12  Yes Historical Provider, MD  metoprolol tartrate (LOPRESSOR) 25 MG tablet Take 25 mg by mouth daily  with breakfast.  04/06/12  Yes Historical Provider, MD  PROAIR HFA 108 (90 BASE) MCG/ACT inhaler 1-2 PUFFS EVERY 4-6 HOURS AS NEEDED Patient taking differently: Inhale 1-2 puffs every 4 to 6 hours as needed for shortness of breath 02/21/14  Yes Chesley Mires, MD  simvastatin (ZOCOR) 20 MG tablet Take 1 tablet by mouth  every evening.  04/10/14  Yes Historical Provider, MD  tiotropium (SPIRIVA) 18 MCG inhalation capsule Place 1 capsule (18 mcg total) into inhaler and inhale daily. 01/28/12  Yes Chesley Mires, MD    Family History  Problem Relation Age of Onset  . Coronary artery disease    . Colon cancer Neg Hx   . Pancreatic cancer Neg Hx   . Rectal cancer Neg Hx   . Esophageal cancer Neg Hx   . Stomach cancer Paternal Grandmother   . Heart disease Mother   . Heart disease Father   . Colon polyps Brother   . Congestive Heart Failure Brother     History   Social History  . Marital Status: Married    Spouse Name: N/A  . Number of Children: N/A  . Years of Education: N/A   Occupational History  . retired Airline pilot in [place]  . Smoking status: Former Smoker -- 0.00 packs/day for 25 years    Types: Pipe    Quit date: 06/25/1995  . Smokeless tobacco: Never Used  . Alcohol Use: No  . Drug Use: No  . Sexual Activity: Not on file   Other Topics Concern  . None   Social History Narrative      Review of Systems: A 12 point ROS discussed and pertinent positives are indicated in the HPI above.  All other systems are negative.  Review of Systems   Vital Signs: BP 141/72 mmHg  Pulse 109  Temp(Src) 98.1 F (36.7 C) (Oral)  Resp 22  Ht 6' (1.829 m)  Wt 263 lb (119.296 kg)  BMI 35.66 kg/m2  SpO2 95%  Physical Exam patient is awake, alert and oriented. Chest with diffuse wheezes. Heart- tachycardia with occasional ectopy; abdomen soft, obese, positive bowel sounds, mild to moderately tender right upper quadrant, slightly tender right lower quadrant; Stram it is full range of motion and no significant edema  Imaging: Ct Abdomen Pelvis Wo Contrast  08/16/2014   CLINICAL DATA:  Two week history of diarrhea, nausea, and vomiting. Symptoms began shortly after recent colonoscopy. History of COPD, hypertension, and hernia.  EXAM: CT ABDOMEN AND PELVIS WITHOUT  CONTRAST  TECHNIQUE: Multidetector CT imaging of the abdomen and pelvis was performed following the standard protocol without IV contrast.  COMPARISON:  03/30/2013  FINDINGS: Evaluation of lungs is limited due to motion artifact. There appears to be prominence of the pulmonary vascularity suggesting pulmonary vascular congestion. Coronary artery calcifications. Multiple calcified lymph nodes in the mediastinum and hila bilaterally.  Multiple stones are present in the gallbladder. There is inflammatory infiltration around the gallbladder with small amount of gas in the gallbladder wall. There is an eccentric fluid collection suggesting hematoma or abscess adjacent to the anterior gallbladder wall. Enlarged gallbladder displaces the hepatic flexure of the colon medially. Edema in the adjacent liver. Stones are present in the common bile duct. No significant ductal dilatation. Appearance is likely due to acute gangrenous cholecystitis.  Unenhanced appearance of the liver, spleen, pancreas, adrenal glands, kidneys, inferior vena cava, and retroperitoneal lymph nodes is unremarkable. Calcification of the abdominal aorta without aneurysm. Stomach  and small bowel are normal. No wall thickening or abnormal distention. Contrast material flows through to the colon without evidence of obstruction. Colon is diffusely contrast filled. There is inflammatory thickening of the hepatic flexure related to be adjacent gallbladder process appear otherwise, no focal colonic wall thickening. No colonic distention. Contrast material flows through to the rectum. Scattered diverticula in the colon. No free air or free fluid in the abdomen.  Pelvis: Prostate gland is not enlarged. Vascular calcifications are present. Bladder wall is not thickened. Fat demonstrated in the inguinal canals. No pelvic mass or lymphadenopathy. No free or loculated pelvic fluid collections. The appendix is normal. Degenerative changes in the lumbar spine. No  destructive bone lesions.  IMPRESSION: Severe inflammatory changes involving the gallbladder and pericholecystic tissues with gas in the gallbladder wall, membranous changes versus abscess, and surrounding inflammatory reaction involving adjacent colon and liver. Multiple stones in the gallbladder. Stones in the common bile duct. Changes are consistent with gangrenous cholecystitis. Surgical consultation is recommended.   Electronically Signed   By: Lucienne Capers M.D.   On: 08/16/2014 17:28   Dg Chest Port 1 View  08/16/2014   CLINICAL DATA:  Shortness of Breath  EXAM: PORTABLE CHEST - 1 VIEW  COMPARISON:  04/07/2014  FINDINGS: Cardiomediastinal silhouette is stable. Persistent streaky atelectasis or scarring left base. No segmental infiltrate or convincing pulmonary edema .  IMPRESSION: Persistent streaky atelectasis or scarring left base. No infiltrate or pulmonary edema.   Electronically Signed   By: Lahoma Crocker M.D.   On: 08/16/2014 10:54   Dg Knee Ap/lat W/sunrise Right  07/28/2014   CLINICAL DATA:  Persistent low back pain radiating into right knee sixth lumbar spine surgery ulna 11 months ago. ; increased burning sensation over the right anterior knee for the past week ; tenderness to palpation over the patella.  EXAM: RIGHT KNEE 3 VIEWS  COMPARISON:  None.  FINDINGS: The bones of the right knee are adequately mineralized. There is no acute or healing fracture. The joint spaces are preserved. There is mild beaking of the tibial spines. There are small spurs from the superior and inferior margins of the articular surface of the patella. There is no joint effusion. There is popliteal artery calcification.  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 There is no acute bony abnormality. No joint effusion is demonstrated.   Electronically Signed   By: David  Martinique   On: 07/28/2014 09:17    Labs:  CBC:  Recent Labs  03/17/14 1305 08/16/14 1040 08/17/14 0545  WBC 11.1*  20.9* 16.2*  HGB 14.2 12.5* 11.4*  HCT 42.7 37.5* 34.9*  PLT 306.0 385 316    COAGS: No results for input(s): INR, APTT in the last 8760 hours.  BMP:  Recent Labs  03/17/14 1305 08/16/14 1040 08/17/14 0545  NA 137 136 140  K 4.4 3.6 3.1*  CL 107 103 109  CO2 21 19 19   GLUCOSE 123* 127* 101*  BUN 21 29* 21  CALCIUM 9.6 9.0 8.5  CREATININE 1.0 0.90 0.80  GFRNONAA  --  82* 86*  GFRAA  --  >90 >90    LIVER FUNCTION TESTS:  Recent Labs  08/16/14 1040 08/17/14 0545  BILITOT 2.5* 1.8*  AST 25 22  ALT 26 22  ALKPHOS 177* 130*  PROT 7.2 6.2  ALBUMIN 2.8* 2.4*    TUMOR MARKERS: No results for input(s): AFPTM, CEA, CA199, CHROMGRNA in the last 8760 hours.  Assessment and Plan:  Travis Ferguson is a 74 y.o. male with history of COPD, CHF recently admitted with 2 week history of nausea/vomiting/diarrhea followed most recently by mild temperature elevation, slight worsening of dyspnea and right upper quadrant greater than right lower quadrant discomfort as well as weakness and orange urine. Laboratory values revealed leukocytosis and elevated total bilirubin of 1.8. Subsequent CT scan of the abdomen revealed severe inflammatory changes involving the gallbladder and pericholecystic tissues with gas and the gallbladder wall, membranous change versus abscess and surrounding inflammatory reaction involving adjacent colon and liver. There are multiple stones in the gallbladder as well as the common bile duct. Findings were consistent with gangrenous cholecystitis. Patient was seen by surgery and deemed poor candidate for cholecystectomy secondary to comorbidities. Request is now received for percutaneous cholecystostomy. Imaging studies were reviewed by Dr. Barbie Banner and patient was deemed a candidate for percutaneous cholecystostomy. He currently has bilateral wheezes and is receiving nebulizer treatment at this time. Oxygen saturations are in the mid 90s on 1 L O2. Details and risks of the  above procedure were discussed with patient and wife including but not limited to internal bleeding, infection ,respiratory difficulties requiring intubation, and death. They appear to understand and agree to proceed with procedure. As long as patient's respiratory status improves with nebulizer will plan procedure for later this afternoon using low dose and titrated conscious sedation.     Signed: Autumn Messing 08/17/2014, 10:12 AM   I spent a total of 20 minutes face to face in clinical consultation, greater than 50% of which was counseling/coordinating care for percutaneous cholecystostomy

## 2014-08-17 NOTE — Progress Notes (Signed)
Subjective: Still with RUQ pain.    Objective: Vital signs in last 24 hours: Temp:  [97.9 F (36.6 C)-98.5 F (36.9 C)] 98.1 F (36.7 C) (02/24 0627) Pulse Rate:  [78-127] 104 (02/24 0627) Resp:  [22-28] 22 (02/24 0627) BP: (123-151)/(75-88) 151/75 mmHg (02/24 0627) SpO2:  [92 %-98 %] 96 % (02/24 0627) Weight:  [263 lb (119.296 kg)] 263 lb (119.296 kg) (02/23 1500) Last BM Date: 08/16/14  Intake/Output from previous day: 02/23 0701 - 02/24 0700 In: 1688.3 [I.V.:1588.3; IV Piggyback:100] Out: 1696 [Urine:1475; Stool:1] Intake/Output this shift:    PE: General- In NAD.  Pursing lips with breathing. Lungs-bilateral wheezes Abdomen-soft, RUQ tenderness  Lab Results:   Recent Labs  08/16/14 1040 08/17/14 0545  WBC 20.9* 16.2*  HGB 12.5* 11.4*  HCT 37.5* 34.9*  PLT 385 316   BMET  Recent Labs  08/16/14 1040 08/17/14 0545  NA 136 140  K 3.6 3.1*  CL 103 109  CO2 19 19  GLUCOSE 127* 101*  BUN 29* 21  CREATININE 0.90 0.80  CALCIUM 9.0 8.5   PT/INR No results for input(s): LABPROT, INR in the last 72 hours. Comprehensive Metabolic Panel:    Component Value Date/Time   NA 140 08/17/2014 0545   NA 136 08/16/2014 1040   K 3.1* 08/17/2014 0545   K 3.6 08/16/2014 1040   CL 109 08/17/2014 0545   CL 103 08/16/2014 1040   CO2 19 08/17/2014 0545   CO2 19 08/16/2014 1040   BUN 21 08/17/2014 0545   BUN 29* 08/16/2014 1040   CREATININE 0.80 08/17/2014 0545   CREATININE 0.90 08/16/2014 1040   GLUCOSE 101* 08/17/2014 0545   GLUCOSE 127* 08/16/2014 1040   CALCIUM 8.5 08/17/2014 0545   CALCIUM 9.0 08/16/2014 1040   AST 22 08/17/2014 0545   AST 25 08/16/2014 1040   ALT 22 08/17/2014 0545   ALT 26 08/16/2014 1040   ALKPHOS 130* 08/17/2014 0545   ALKPHOS 177* 08/16/2014 1040   BILITOT 1.8* 08/17/2014 0545   BILITOT 2.5* 08/16/2014 1040   PROT 6.2 08/17/2014 0545   PROT 7.2 08/16/2014 1040   ALBUMIN 2.4* 08/17/2014 0545   ALBUMIN 2.8* 08/16/2014 1040      Studies/Results: Ct Abdomen Pelvis Wo Contrast  08/16/2014   CLINICAL DATA:  Two week history of diarrhea, nausea, and vomiting. Symptoms began shortly after recent colonoscopy. History of COPD, hypertension, and hernia.  EXAM: CT ABDOMEN AND PELVIS WITHOUT CONTRAST  TECHNIQUE: Multidetector CT imaging of the abdomen and pelvis was performed following the standard protocol without IV contrast.  COMPARISON:  03/30/2013  FINDINGS: Evaluation of lungs is limited due to motion artifact. There appears to be prominence of the pulmonary vascularity suggesting pulmonary vascular congestion. Coronary artery calcifications. Multiple calcified lymph nodes in the mediastinum and hila bilaterally.  Multiple stones are present in the gallbladder. There is inflammatory infiltration around the gallbladder with small amount of gas in the gallbladder wall. There is an eccentric fluid collection suggesting hematoma or abscess adjacent to the anterior gallbladder wall. Enlarged gallbladder displaces the hepatic flexure of the colon medially. Edema in the adjacent liver. Stones are present in the common bile duct. No significant ductal dilatation. Appearance is likely due to acute gangrenous cholecystitis.  Unenhanced appearance of the liver, spleen, pancreas, adrenal glands, kidneys, inferior vena cava, and retroperitoneal lymph nodes is unremarkable. Calcification of the abdominal aorta without aneurysm. Stomach and small bowel are normal. No wall thickening or abnormal distention. Contrast material flows through to  the colon without evidence of obstruction. Colon is diffusely contrast filled. There is inflammatory thickening of the hepatic flexure related to be adjacent gallbladder process appear otherwise, no focal colonic wall thickening. No colonic distention. Contrast material flows through to the rectum. Scattered diverticula in the colon. No free air or free fluid in the abdomen.  Pelvis: Prostate gland is not  enlarged. Vascular calcifications are present. Bladder wall is not thickened. Fat demonstrated in the inguinal canals. No pelvic mass or lymphadenopathy. No free or loculated pelvic fluid collections. The appendix is normal. Degenerative changes in the lumbar spine. No destructive bone lesions.  IMPRESSION: Severe inflammatory changes involving the gallbladder and pericholecystic tissues with gas in the gallbladder wall, membranous changes versus abscess, and surrounding inflammatory reaction involving adjacent colon and liver. Multiple stones in the gallbladder. Stones in the common bile duct. Changes are consistent with gangrenous cholecystitis. Surgical consultation is recommended.   Electronically Signed   By: Lucienne Capers M.D.   On: 08/16/2014 17:28   Dg Chest Port 1 View  08/16/2014   CLINICAL DATA:  Shortness of Breath  EXAM: PORTABLE CHEST - 1 VIEW  COMPARISON:  04/07/2014  FINDINGS: Cardiomediastinal silhouette is stable. Persistent streaky atelectasis or scarring left base. No segmental infiltrate or convincing pulmonary edema .  IMPRESSION: Persistent streaky atelectasis or scarring left base. No infiltrate or pulmonary edema.   Electronically Signed   By: Lahoma Crocker M.D.   On: 08/16/2014 10:54    Anti-infectives: Anti-infectives    Start     Dose/Rate Route Frequency Ordered Stop   08/16/14 1900  Ampicillin-Sulbactam (UNASYN) 3 g in sodium chloride 0.9 % 100 mL IVPB     3 g 100 mL/hr over 60 Minutes Intravenous Every 6 hours 08/16/14 1804     08/16/14 1500  metroNIDAZOLE (FLAGYL) IVPB 500 mg  Status:  Discontinued     500 mg 100 mL/hr over 60 Minutes Intravenous 3 times per day 08/16/14 1348 08/16/14 1735   08/16/14 1400  ciprofloxacin (CIPRO) IVPB 400 mg  Status:  Discontinued     400 mg 200 mL/hr over 60 Minutes Intravenous Every 12 hours 08/16/14 1346 08/16/14 1735   08/16/14 1345  metroNIDAZOLE (FLAGYL) IVPB 500 mg  Status:  Discontinued     500 mg 100 mL/hr over 60 Minutes  Intravenous Every 8 hours 08/16/14 1331 08/16/14 1348      Assessment Active Problems:   Acute calculous cholecystitis with surround phlegmon-on IV abxs   Choledocholithiasis on CT-LFTs down some today.   COPD with chronic bronchitis   Essential hypertension   Hyperlipidemia   Diastolic CHF     LOS: 1 day   Plan: Percutaneous cholecystostomy today.   Change to Zosyn as E. Coli not well covered by Unasyn in [place].  Would recommend a GI consult because of choledocholithiasis seen on CT.    Travis Ferguson 08/17/2014

## 2014-08-18 ENCOUNTER — Encounter (HOSPITAL_COMMUNITY): Payer: Self-pay | Admitting: Anesthesiology

## 2014-08-18 LAB — COMPREHENSIVE METABOLIC PANEL
ALT: 19 U/L (ref 0–53)
AST: 17 U/L (ref 0–37)
Albumin: 2.4 g/dL — ABNORMAL LOW (ref 3.5–5.2)
Alkaline Phosphatase: 116 U/L (ref 39–117)
Anion gap: 7 (ref 5–15)
BUN: 24 mg/dL — AB (ref 6–23)
CALCIUM: 8.5 mg/dL (ref 8.4–10.5)
CHLORIDE: 114 mmol/L — AB (ref 96–112)
CO2: 21 mmol/L (ref 19–32)
Creatinine, Ser: 1.12 mg/dL (ref 0.50–1.35)
GFR, EST AFRICAN AMERICAN: 73 mL/min — AB (ref >90)
GFR, EST NON AFRICAN AMERICAN: 63 mL/min — AB (ref >90)
GLUCOSE: 171 mg/dL — AB (ref 70–99)
Potassium: 4.1 mmol/L (ref 3.5–5.1)
Sodium: 142 mmol/L (ref 135–145)
TOTAL PROTEIN: 6.4 g/dL (ref 6.0–8.3)
Total Bilirubin: 1.4 mg/dL — ABNORMAL HIGH (ref 0.3–1.2)

## 2014-08-18 LAB — CBC WITH DIFFERENTIAL/PLATELET
Basophils Absolute: 0 10*3/uL (ref 0.0–0.1)
Basophils Relative: 0 % (ref 0–1)
EOS ABS: 0 10*3/uL (ref 0.0–0.7)
Eosinophils Relative: 0 % (ref 0–5)
HEMATOCRIT: 35.7 % — AB (ref 39.0–52.0)
HEMOGLOBIN: 11.5 g/dL — AB (ref 13.0–17.0)
Lymphocytes Relative: 5 % — ABNORMAL LOW (ref 12–46)
Lymphs Abs: 0.8 10*3/uL (ref 0.7–4.0)
MCH: 27.4 pg (ref 26.0–34.0)
MCHC: 32.2 g/dL (ref 30.0–36.0)
MCV: 85.2 fL (ref 78.0–100.0)
MONO ABS: 0.2 10*3/uL (ref 0.1–1.0)
Monocytes Relative: 1 % — ABNORMAL LOW (ref 3–12)
NEUTROS ABS: 14.2 10*3/uL — AB (ref 1.7–7.7)
Neutrophils Relative %: 94 % — ABNORMAL HIGH (ref 43–77)
Platelets: ADEQUATE 10*3/uL (ref 150–400)
RBC: 4.19 MIL/uL — AB (ref 4.22–5.81)
RDW: 15.1 % (ref 11.5–15.5)
WBC: 15.2 10*3/uL — AB (ref 4.0–10.5)

## 2014-08-18 LAB — MAGNESIUM: Magnesium: 1.8 mg/dL (ref 1.5–2.5)

## 2014-08-18 NOTE — Progress Notes (Signed)
At beginning of night shift, RN paged secondary to pt wheezing with a RR in the mid 20s. No desaturation. Other VSS. No use of accessory muscles. Pt received his scheduled Solumedrol and a neb and respiratory effort quickly returned to normal. Redid CXR to check for fluid given hx of CHF. CXR only showed chronic bronchitic changes.  Have spoken to RN again about 2 hours later, and pt is doing fine. Continue IV steroids and nebs.  Clance Boll, NP [facility]

## 2014-08-18 NOTE — Progress Notes (Signed)
Discharge instructions given to patient along with prescriptions.  Questions answered 

## 2014-08-18 NOTE — Progress Notes (Signed)
Subjective: RUQ pain is better since drain placed.  Objective: Vital signs in last 24 hours: Temp:  [97.7 F (36.5 C)-98.6 F (37 C)] 98 F (36.7 C) (02/25 0546) Pulse Rate:  [83-109] 84 (02/25 0736) Resp:  [18-26] 18 (02/25 0546) BP: (126-157)/(66-95) 126/66 mmHg (02/25 0546) SpO2:  [90 %-99 %] 92 % (02/25 0546) Last BM Date: 08/16/14  Intake/Output from previous day: 02/24 0701 - 02/25 0700 In: 1154.2 [I.V.:1104.2; IV Piggyback:50] Out: 1115 [Urine:875; Drains:240] Intake/Output this shift: Total I/O In: -  Out: 20 [Drains:20]  PE: General- In NAD.   Abdomen-soft, significantly less RUQ tenderness, RUQ drain with purulent output  Lab Results:   Recent Labs  08/17/14 0545 08/18/14 0510  WBC 16.2* 15.2*  HGB 11.4* 11.5*  HCT 34.9* 35.7*  PLT 316 PLATELET CLUMPS NOTED ON SMEAR, COUNT APPEARS ADEQUATE   BMET  Recent Labs  08/17/14 0545 08/18/14 0510  NA 140 142  K 3.1* 4.1  CL 109 114*  CO2 19 21  GLUCOSE 101* 171*  BUN 21 24*  CREATININE 0.80 1.12  CALCIUM 8.5 8.5   PT/INR No results for input(s): LABPROT, INR in the last 72 hours. Comprehensive Metabolic Panel:    Component Value Date/Time   NA 142 08/18/2014 0510   NA 140 08/17/2014 0545   K 4.1 08/18/2014 0510   K 3.1* 08/17/2014 0545   CL 114* 08/18/2014 0510   CL 109 08/17/2014 0545   CO2 21 08/18/2014 0510   CO2 19 08/17/2014 0545   BUN 24* 08/18/2014 0510   BUN 21 08/17/2014 0545   CREATININE 1.12 08/18/2014 0510   CREATININE 0.80 08/17/2014 0545   GLUCOSE 171* 08/18/2014 0510   GLUCOSE 101* 08/17/2014 0545   CALCIUM 8.5 08/18/2014 0510   CALCIUM 8.5 08/17/2014 0545   AST 17 08/18/2014 0510   AST 22 08/17/2014 0545   ALT 19 08/18/2014 0510   ALT 22 08/17/2014 0545   ALKPHOS 116 08/18/2014 0510   ALKPHOS 130* 08/17/2014 0545   BILITOT 1.4* 08/18/2014 0510   BILITOT 1.8* 08/17/2014 0545   PROT 6.4 08/18/2014 0510   PROT 6.2 08/17/2014 0545   ALBUMIN 2.4* 08/18/2014 0510    ALBUMIN 2.4* 08/17/2014 0545     Studies/Results: Ct Abdomen Pelvis Wo Contrast  08/16/2014   CLINICAL DATA:  Two week history of diarrhea, nausea, and vomiting. Symptoms began shortly after recent colonoscopy. History of COPD, hypertension, and hernia.  EXAM: CT ABDOMEN AND PELVIS WITHOUT CONTRAST  TECHNIQUE: Multidetector CT imaging of the abdomen and pelvis was performed following the standard protocol without IV contrast.  COMPARISON:  03/30/2013  FINDINGS: Evaluation of lungs is limited due to motion artifact. There appears to be prominence of the pulmonary vascularity suggesting pulmonary vascular congestion. Coronary artery calcifications. Multiple calcified lymph nodes in the mediastinum and hila bilaterally.  Multiple stones are present in the gallbladder. There is inflammatory infiltration around the gallbladder with small amount of gas in the gallbladder wall. There is an eccentric fluid collection suggesting hematoma or abscess adjacent to the anterior gallbladder wall. Enlarged gallbladder displaces the hepatic flexure of the colon medially. Edema in the adjacent liver. Stones are present in the common bile duct. No significant ductal dilatation. Appearance is likely due to acute gangrenous cholecystitis.  Unenhanced appearance of the liver, spleen, pancreas, adrenal glands, kidneys, inferior vena cava, and retroperitoneal lymph nodes is unremarkable. Calcification of the abdominal aorta without aneurysm. Stomach and small bowel are normal. No wall thickening or abnormal distention. Contrast  material flows through to the colon without evidence of obstruction. Colon is diffusely contrast filled. There is inflammatory thickening of the hepatic flexure related to be adjacent gallbladder process appear otherwise, no focal colonic wall thickening. No colonic distention. Contrast material flows through to the rectum. Scattered diverticula in the colon. No free air or free fluid in the abdomen.  Pelvis:  Prostate gland is not enlarged. Vascular calcifications are present. Bladder wall is not thickened. Fat demonstrated in the inguinal canals. No pelvic mass or lymphadenopathy. No free or loculated pelvic fluid collections. The appendix is normal. Degenerative changes in the lumbar spine. No destructive bone lesions.  IMPRESSION: Severe inflammatory changes involving the gallbladder and pericholecystic tissues with gas in the gallbladder wall, membranous changes versus abscess, and surrounding inflammatory reaction involving adjacent colon and liver. Multiple stones in the gallbladder. Stones in the common bile duct. Changes are consistent with gangrenous cholecystitis. Surgical consultation is recommended.   Electronically Signed   By: Lucienne Capers M.D.   On: 08/16/2014 17:28   Ir Perc Cholecystostomy  08/17/2014   CLINICAL DATA:  Acute cholecystitis  EXAM: CHOLECYSTOSTOMY  FLUOROSCOPY TIME:  1 minutes and 6 seconds.  MEDICATIONS AND MEDICAL HISTORY: Versed 0 mg, Fentanyl 50 mcg.  Additional Medications: Zosyn 3.375 g  ANESTHESIA/SEDATION: Moderate sedation time: 14 minutes  CONTRAST:  5 cc Omnipaque 300  PROCEDURE: The procedure, risks, benefits, and alternatives were explained to the patient. Questions regarding the procedure were encouraged and answered. The patient understands and consents to the procedure.  The right upper quadrant was prepped with Betadine in a sterile fashion, and a sterile drape was applied covering the operative field. A sterile gown and sterile gloves were used for the procedure.  A 21 gauge needle was inserted into the gallbladder lumen under sonographic guidance and via transhepatic approach. It was removed over a 018 wire, which was upsized to a 3-J. A 10-French drain was advanced over the wire and coiled in the gallbladder lumen. It was sewn to the skin after being string fixed.  FINDINGS: The cystic duct is occluded.  COMPLICATIONS: None  IMPRESSION: Successful cholecystostomy.  This needs to remain in place at least 6 weeks.   Electronically Signed   By: Marybelle Killings M.D.   On: 08/17/2014 16:19   Dg Chest Port 1 View  08/17/2014   CLINICAL DATA:  Dyspnea. Labored breathing sounds. 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 Borderline heart size and pulmonary vascularity are likely normal for technique. Diffuse interstitial pattern in the lungs possibly a chronic bronchitic changes. Linear atelectasis in the right lung base. No blunting of costophrenic angles. No apparent pneumothorax. Degenerative changes in the spine. Similar appearance to previous study.  IMPRESSION: Shallow inspiration with atelectasis in the lung bases. Probable chronic bronchitic changes.   Electronically Signed   By: Lucienne Capers M.D.   On: 08/17/2014 23:28   Dg Chest Port 1 View  08/17/2014   CLINICAL DATA:  Hypertension, COPD.  Shortness of Breath.  EXAM: PORTABLE CHEST - 1 VIEW  COMPARISON:  08/16/2014  FINDINGS: Mild elevation of the right hemidiaphragm, stable. Right base atelectasis. Heart is borderline in size. Mild vascular congestion without overt edema. No effusions. No acute bony abnormality.  IMPRESSION: Borderline heart size, vascular congestion.  Right base atelectasis.   Electronically Signed   By: Rolm Baptise M.D.   On: 08/17/2014 14:24   Dg Chest  Port 1 View  08/16/2014   CLINICAL DATA:  Shortness of Breath  EXAM: PORTABLE CHEST - 1 VIEW  COMPARISON:  04/07/2014  FINDINGS: Cardiomediastinal silhouette is stable. Persistent streaky atelectasis or scarring left base. No segmental infiltrate or convincing pulmonary edema .  IMPRESSION: Persistent streaky atelectasis or scarring left base. No infiltrate or pulmonary edema.   Electronically Signed   By: Lahoma Crocker M.D.   On: 08/16/2014 10:54    Anti-infectives: Anti-infectives    Start     Dose/Rate Route Frequency Ordered Stop   08/17/14  1200  piperacillin-tazobactam (ZOSYN) IVPB 3.375 g     3.375 g 12.5 mL/hr over 240 Minutes Intravenous 3 times per day 08/17/14 0811     08/16/14 1900  Ampicillin-Sulbactam (UNASYN) 3 g in sodium chloride 0.9 % 100 mL IVPB  Status:  Discontinued     3 g 100 mL/hr over 60 Minutes Intravenous Every 6 hours 08/16/14 1804 08/17/14 0811   08/16/14 1500  metroNIDAZOLE (FLAGYL) IVPB 500 mg  Status:  Discontinued     500 mg 100 mL/hr over 60 Minutes Intravenous 3 times per day 08/16/14 1348 08/16/14 1735   08/16/14 1400  ciprofloxacin (CIPRO) IVPB 400 mg  Status:  Discontinued     400 mg 200 mL/hr over 60 Minutes Intravenous Every 12 hours 08/16/14 1346 08/16/14 1735   08/16/14 1345  metroNIDAZOLE (FLAGYL) IVPB 500 mg  Status:  Discontinued     500 mg 100 mL/hr over 60 Minutes Intravenous Every 8 hours 08/16/14 1331 08/16/14 1348      Assessment Active Problems:   Acute calculous cholecystitis with surround phlegmon s/p cholecystostomy (Dr. Farrel Gordon IV abxs; feeling better   Choledocholithiasis on CT-LFTs normalizing; ERCP planned when pulmonary status more stable.   COPD with chronic bronchitis   Essential hypertension   Hyperlipidemia   Diastolic CHF     LOS: 2 days   Plan:  Could start clear liquids if ERCP is not going to be done today.  Leave cholecystostomy tube in for at least 6 weeks.    Travis Ferguson 08/18/2014

## 2014-08-18 NOTE — Progress Notes (Signed)
[facility] PROGRESS NOTE  SHOWN DISSINGER PFX:[id number] DOB: 20-Dec-1940 DOA: 08/16/2014 PCP: Jerlyn Ly, MD  Assessment/Plan: #1 sepsis Secondary to gangrenous cholecystitis noted on CT of the abdomen and pelvis. Patient met criteria for sepsis secondary to tachycardia, leukocytosis, increased respiratory rate. C. difficile PCR was negative. Blood cultures are pending. Patient is status post percutaneous cholecystostomy drain with fluid cultures pending. Continue empiric IV Zosyn. General surgery and GI following.  #2 gangrenous cholecystitis Due to patient's comorbidities and pulmonary status was recommended by general surgery that patient undergo percutaneous cholecystostomy which was done per interventional radiology. Body fluid cultures are pending. Patient has been pancultured. Continue empiric IV Zosyn. Follow.  #3 acute COPD exacerbation Patient with both expiratory and inspiratory wheezing on examination. Continue IV steroids, scheduled nebulizers, oxygen, pulmonary toilet. If no significant improvement will consult with pulmonary.  #4 choledocholithiasis per CT of the abdomen and pelvis.  Patient will need an ERCP for common bile duct stone retrieval per GI once his pulmonary status has improved. GI following and appreciate input and recommendations.  #5 history of diastolic CHF Stable. Follow.  #6 hypertension Stable.  #7 hyperlipidemia Continue statin.  #8 prophylaxis Heparin for DVT prophylaxis.   Code Status: Full Family Communication: Updated patient, wife at bedside. Disposition Plan: Remain inpatient.   Consultants:  Gen. surgery: Dr. Hunt Oris 08/16/2014  Gastroenterology: Dr. Fuller Plan 08/17/2014  Interventional radiology: Dr. Barbie Banner 08/17/2014  Procedures:  CT abdomen and pelvis 08/16/2014  Chest x-ray 08/16/2014  Status post percutaneous cholecystostomy 08/17/2014 per Dr. Barbie Banner  Antibiotics:  IV Zosyn 08/17/2014  IV Flagyl 08/16/2014>>>>>  08/16/2014  IV ciprofloxacin 08/16/2014>>> 08/16/2014  IV Unasyn 08/16/2014>>>> 08/17/2014  HPI/Subjective: Patient states feeling better today. Patient states pain is better controlled. Patient feels wheezing is improving.   Objective: Filed Vitals:   08/18/14 0736  BP:   Pulse: 84  Temp:   Resp:     Intake/Output Summary (Last 24 hours) at 08/18/14 1344 Last data filed at 08/18/14 1000  Gross per 24 hour  Intake 1554.17 ml  Output   1335 ml  Net 219.17 ml   Filed Weights   08/16/14 1500  Weight: 119.296 kg (263 lb)    Exam:   General:  NAD  Cardiovascular: RRR  Respiratory: decreased Inspiratory and expiratory wheezing. No crackles.  Abdomen: Soft, nontender, nondistended, positive bowel sounds. Percutaneous drain with purulent brown output.  Musculoskeletal: No clubbing cyanosis or edema.  Data Reviewed: Basic Metabolic Panel:  Recent Labs Lab 08/16/14 1040 08/17/14 0545 08/17/14 0856 08/18/14 0510  NA 136 140  --  142  K 3.6 3.1*  --  4.1  CL 103 109  --  114*  CO2 19 19  --  21  GLUCOSE 127* 101*  --  171*  BUN 29* 21  --  24*  CREATININE 0.90 0.80  --  1.12  CALCIUM 9.0 8.5  --  8.5  MG  --   --  1.6 1.8   Liver Function Tests:  Recent Labs Lab 08/16/14 1040 08/17/14 0545 08/18/14 0510  AST 25 22 17   ALT 26 22 19   ALKPHOS 177* 130* 116  BILITOT 2.5* 1.8* 1.4*  PROT 7.2 6.2 6.4  ALBUMIN 2.8* 2.4* 2.4*   No results for input(s): LIPASE, AMYLASE in the last 168 hours. No results for input(s): AMMONIA in the last 168 hours. CBC:  Recent Labs Lab 08/16/14 1040 08/17/14 0545 08/18/14 0510  WBC 20.9* 16.2* 15.2*  NEUTROABS 17.7*  --  14.2*  HGB 12.5* 11.4* 11.5*  HCT 37.5* 34.9* 35.7*  MCV 82.2 83.5 85.2  PLT 385 316 PLATELET CLUMPS NOTED ON SMEAR, COUNT APPEARS ADEQUATE   Cardiac Enzymes: No results for input(s): CKTOTAL, CKMB, CKMBINDEX, TROPONINI in the last 168 hours. BNP (last 3 results) No results for input(s): BNP in  the last 8760 hours.  ProBNP (last 3 results)  Recent Labs  03/17/14 1305  PROBNP 235.0*    CBG: No results for input(s): GLUCAP in the last 168 hours.  Recent Results (from the past 240 hour(s))  Blood culture (routine x 2)     Status: None (Preliminary result)   Collection Time: 08/16/14 12:27 PM  Result Value Ref Range Status   Specimen Description BLOOD RIGHT HAND  Final   Special Requests   Final    BOTTLES DRAWN AEROBIC AND ANAEROBIC 5CC BOTH BOTTLES   Culture   Final           BLOOD CULTURE RECEIVED NO GROWTH TO DATE CULTURE WILL BE HELD FOR 5 DAYS BEFORE ISSUING A FINAL NEGATIVE REPORT Performed at [facility]    Report Status PENDING  Incomplete  Blood culture (routine x 2)     Status: None (Preliminary result)   Collection Time: 08/16/14 12:27 PM  Result Value Ref Range Status   Specimen Description BLOOD RIGHT ARM  Final   Special Requests   Final    BOTTLES DRAWN AEROBIC AND ANAEROBIC 4CC BOTH BOTTLES   Culture   Final           BLOOD CULTURE RECEIVED NO GROWTH TO DATE CULTURE WILL BE HELD FOR 5 DAYS BEFORE ISSUING A FINAL NEGATIVE REPORT Performed at [facility]    Report Status PENDING  Incomplete  Clostridium Difficile by PCR     Status: None   Collection Time: 08/16/14  3:11 PM  Result Value Ref Range Status   C difficile by pcr NEGATIVE NEGATIVE Final    Comment: Performed at [facility]  Stool culture     Status: None (Preliminary result)   Collection Time: 08/16/14  3:11 PM  Result Value Ref Range Status   Specimen Description STOOL  Final   Special Requests NONE  Final   Culture   Final    NO SUSPICIOUS COLONIES, CONTINUING TO HOLD Performed at [facility]    Report Status PENDING  Incomplete  Body fluid culture     Status: None (Preliminary result)   Collection Time: 08/17/14  2:18 PM  Result Value Ref Range Status   Specimen Description GALL BLADDER  Final   Special Requests NONE  Final   Gram Stain    Final    ABUNDANT WBC PRESENT,BOTH PMN AND MONONUCLEAR NO SQUAMOUS EPITHELIAL CELLS SEEN FEW GRAM POSITIVE COCCI IN PAIRS FEW GRAM NEGATIVE RODS Performed at [facility]    Culture PENDING  Incomplete   Report Status PENDING  Incomplete     Studies: Ct Abdomen Pelvis Wo Contrast  08/16/2014   CLINICAL DATA:  Two week history of diarrhea, nausea, and vomiting. Symptoms began shortly after recent colonoscopy. History of COPD, hypertension, and hernia.  EXAM: CT ABDOMEN AND PELVIS WITHOUT CONTRAST  TECHNIQUE: Multidetector CT imaging of the abdomen and pelvis was performed following the standard protocol without IV contrast.  COMPARISON:  03/30/2013  FINDINGS: Evaluation of lungs is limited due to motion artifact. There appears to be prominence of the pulmonary vascularity suggesting pulmonary vascular congestion. Coronary artery calcifications. Multiple calcified lymph  nodes in the mediastinum and hila bilaterally.  Multiple stones are present in the gallbladder. There is inflammatory infiltration around the gallbladder with small amount of gas in the gallbladder wall. There is an eccentric fluid collection suggesting hematoma or abscess adjacent to the anterior gallbladder wall. Enlarged gallbladder displaces the hepatic flexure of the colon medially. Edema in the adjacent liver. Stones are present in the common bile duct. No significant ductal dilatation. Appearance is likely due to acute gangrenous cholecystitis.  Unenhanced appearance of the liver, spleen, pancreas, adrenal glands, kidneys, inferior vena cava, and retroperitoneal lymph nodes is unremarkable. Calcification of the abdominal aorta without aneurysm. Stomach and small bowel are normal. No wall thickening or abnormal distention. Contrast material flows through to the colon without evidence of obstruction. Colon is diffusely contrast filled. There is inflammatory thickening of the hepatic flexure related to be adjacent gallbladder  process appear otherwise, no focal colonic wall thickening. No colonic distention. Contrast material flows through to the rectum. Scattered diverticula in the colon. No free air or free fluid in the abdomen.  Pelvis: Prostate gland is not enlarged. Vascular calcifications are present. Bladder wall is not thickened. Fat demonstrated in the inguinal canals. No pelvic mass or lymphadenopathy. No free or loculated pelvic fluid collections. The appendix is normal. Degenerative changes in the lumbar spine. No destructive bone lesions.  IMPRESSION: Severe inflammatory changes involving the gallbladder and pericholecystic tissues with gas in the gallbladder wall, membranous changes versus abscess, and surrounding inflammatory reaction involving adjacent colon and liver. Multiple stones in the gallbladder. Stones in the common bile duct. Changes are consistent with gangrenous cholecystitis. Surgical consultation is recommended.   Electronically Signed   By: Lucienne Capers M.D.   On: 08/16/2014 17:28   Ir Perc Cholecystostomy  08/17/2014   CLINICAL DATA:  Acute cholecystitis  EXAM: CHOLECYSTOSTOMY  FLUOROSCOPY TIME:  1 minutes and 6 seconds.  MEDICATIONS AND MEDICAL HISTORY: Versed 0 mg, Fentanyl 50 mcg.  Additional Medications: Zosyn 3.375 g  ANESTHESIA/SEDATION: Moderate sedation time: 14 minutes  CONTRAST:  5 cc Omnipaque 300  PROCEDURE: The procedure, risks, benefits, and alternatives were explained to the patient. Questions regarding the procedure were encouraged and answered. The patient understands and consents to the procedure.  The right upper quadrant was prepped with Betadine in a sterile fashion, and a sterile drape was applied covering the operative field. A sterile gown and sterile gloves were used for the procedure.  A 21 gauge needle was inserted into the gallbladder lumen under sonographic guidance and via transhepatic approach. It was removed over a 018 wire, which was upsized to a 3-J. A 10-French  drain was advanced over the wire and coiled in the gallbladder lumen. It was sewn to the skin after being string fixed.  FINDINGS: The cystic duct is occluded.  COMPLICATIONS: None  IMPRESSION: Successful cholecystostomy. This needs to remain in place at least 6 weeks.   Electronically Signed   By: Marybelle Killings M.D.   On: 08/17/2014 16:19   Dg Chest Port 1 View  08/17/2014   CLINICAL DATA:  Dyspnea. Labored breathing sounds. 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 Borderline heart size and pulmonary vascularity are likely normal for technique. Diffuse interstitial pattern in the lungs possibly a chronic bronchitic changes. Linear atelectasis in the right lung base. No blunting of costophrenic angles. No apparent pneumothorax. Degenerative changes in the spine. Similar appearance to previous  study.  IMPRESSION: Shallow inspiration with atelectasis in the lung bases. Probable chronic bronchitic changes.   Electronically Signed   By: Lucienne Capers M.D.   On: 08/17/2014 23:28   Dg Chest Port 1 View  08/17/2014   CLINICAL DATA:  Hypertension, COPD.  Shortness of Breath.  EXAM: PORTABLE CHEST - 1 VIEW  COMPARISON:  08/16/2014  FINDINGS: Mild elevation of the right hemidiaphragm, stable. Right base atelectasis. Heart is borderline in size. Mild vascular congestion without overt edema. No effusions. No acute bony abnormality.  IMPRESSION: Borderline heart size, vascular congestion.  Right base atelectasis.   Electronically Signed   By: Rolm Baptise M.D.   On: 08/17/2014 14:24    Scheduled Meds: . budesonide-formoterol  2 puff Inhalation BID  . finasteride  5 mg Oral QPM  . FLUoxetine  40 mg Oral Q breakfast  . heparin  5,000 Units Subcutaneous 3 times per day  . [START ON 08/19/2014] indomethacin  100 mg Rectal Once  . ipratropium  0.5 mg Nebulization Q4H WA  . levalbuterol  0.63 mg Nebulization Q4H  .  methylPREDNISolone (SOLU-MEDROL) injection  125 mg Intravenous 3 times per day  . metoprolol tartrate  25 mg Oral Q breakfast  . piperacillin-tazobactam (ZOSYN)  IV  3.375 g Intravenous 3 times per day  . simvastatin  20 mg Oral QPM  . traMADol  100 mg Oral 3 times per day  . traZODone  100 mg Oral QHS   Continuous Infusions: . sodium chloride 75 mL/hr at 08/17/14 1410    Principal Problem:   Sepsis Active Problems:   Gangrenous cholecystitis   Cholecystitis, acute with cholelithiasis   COPD exacerbation   COPD with chronic bronchitis   Essential hypertension   Hyperlipidemia   Diastolic CHF   Hypokalemia   Calculus of gallbladder with acute cholecystitis and obstruction   Choledocholithiasis    Time spent: 41 minutes    THOMPSON,DANIEL M.D. [facility] Pager [phone]. If 7PM-7AM, please contact night-coverage at [email or web address], password [facility] 08/18/2014, 1:44 PM  LOS: 2 days

## 2014-08-18 NOTE — Significant Event (Signed)
Rapid Response Event Note  Overview: Time Called: 2140 Arrival Time: 2150 Event Type: Respiratory  Initial Focused Assessment: Patient with labored respirations 23-26 inspiratory and expiratory through out. Had received Resp. Treatment prior to arrival.    Interventions:V/S observed and are stable and reviewed chart. Paged NP and discussed. Repeat CXR    Event Summary: Name of Physician Notified: Forrest Moron NP  at [id number]    at    Outcome: Stayed in room and stabalized     Pricilla Riffle

## 2014-08-18 NOTE — Progress Notes (Signed)
Subjective: Pt ok. Pain improved since drain placed yesterday Reports some mild discomfort at drain site  Objective: Physical Exam: BP 126/66 mmHg  Pulse 84  Temp(Src) 98 F (36.7 C) (Axillary)  Resp 18  Ht 6' (1.829 m)  Wt 263 lb (119.296 kg)  BMI 35.66 kg/m2  SpO2 93% RUQ drain intact, site clean. No hematoma Output dark purulent WBC down some   Labs: CBC  Recent Labs  08/17/14 0545 08/18/14 0510  WBC 16.2* 15.2*  HGB 11.4* 11.5*  HCT 34.9* 35.7*  PLT 316 PLATELET CLUMPS NOTED ON SMEAR, COUNT APPEARS ADEQUATE   BMET  Recent Labs  08/17/14 0545 08/18/14 0510  NA 140 142  K 3.1* 4.1  CL 109 114*  CO2 19 21  GLUCOSE 101* 171*  BUN 21 24*  CREATININE 0.80 1.12  CALCIUM 8.5 8.5   LFT  Recent Labs  08/18/14 0510  PROT 6.4  ALBUMIN 2.4*  AST 17  ALT 19  ALKPHOS 116  BILITOT 1.4*   PT/INR No results for input(s): LABPROT, INR in the last 72 hours.   Studies/Results: Ct Abdomen Pelvis Wo Contrast  08/16/2014   CLINICAL DATA:  Two week history of diarrhea, nausea, and vomiting. Symptoms began shortly after recent colonoscopy. History of COPD, hypertension, and hernia.  EXAM: CT ABDOMEN AND PELVIS WITHOUT CONTRAST  TECHNIQUE: Multidetector CT imaging of the abdomen and pelvis was performed following the standard protocol without IV contrast.  COMPARISON:  03/30/2013  FINDINGS: Evaluation of lungs is limited due to motion artifact. There appears to be prominence of the pulmonary vascularity suggesting pulmonary vascular congestion. Coronary artery calcifications. Multiple calcified lymph nodes in the mediastinum and hila bilaterally.  Multiple stones are present in the gallbladder. There is inflammatory infiltration around the gallbladder with small amount of gas in the gallbladder wall. There is an eccentric fluid collection suggesting hematoma or abscess adjacent to the anterior gallbladder wall. Enlarged gallbladder displaces the hepatic flexure of the colon  medially. Edema in the adjacent liver. Stones are present in the common bile duct. No significant ductal dilatation. Appearance is likely due to acute gangrenous cholecystitis.  Unenhanced appearance of the liver, spleen, pancreas, adrenal glands, kidneys, inferior vena cava, and retroperitoneal lymph nodes is unremarkable. Calcification of the abdominal aorta without aneurysm. Stomach and small bowel are normal. No wall thickening or abnormal distention. Contrast material flows through to the colon without evidence of obstruction. Colon is diffusely contrast filled. There is inflammatory thickening of the hepatic flexure related to be adjacent gallbladder process appear otherwise, no focal colonic wall thickening. No colonic distention. Contrast material flows through to the rectum. Scattered diverticula in the colon. No free air or free fluid in the abdomen.  Pelvis: Prostate gland is not enlarged. Vascular calcifications are present. Bladder wall is not thickened. Fat demonstrated in the inguinal canals. No pelvic mass or lymphadenopathy. No free or loculated pelvic fluid collections. The appendix is normal. Degenerative changes in the lumbar spine. No destructive bone lesions.  IMPRESSION: Severe inflammatory changes involving the gallbladder and pericholecystic tissues with gas in the gallbladder wall, membranous changes versus abscess, and surrounding inflammatory reaction involving adjacent colon and liver. Multiple stones in the gallbladder. Stones in the common bile duct. Changes are consistent with gangrenous cholecystitis. Surgical consultation is recommended.   Electronically Signed   By: Lucienne Capers M.D.   On: 08/16/2014 17:28   Ir Perc Cholecystostomy  08/17/2014   CLINICAL DATA:  Acute cholecystitis  EXAM: CHOLECYSTOSTOMY  FLUOROSCOPY TIME:  1 minutes and 6 seconds.  MEDICATIONS AND MEDICAL HISTORY: Versed 0 mg, Fentanyl 50 mcg.  Additional Medications: Zosyn 3.375 g  ANESTHESIA/SEDATION:  Moderate sedation time: 14 minutes  CONTRAST:  5 cc Omnipaque 300  PROCEDURE: The procedure, risks, benefits, and alternatives were explained to the patient. Questions regarding the procedure were encouraged and answered. The patient understands and consents to the procedure.  The right upper quadrant was prepped with Betadine in a sterile fashion, and a sterile drape was applied covering the operative field. A sterile gown and sterile gloves were used for the procedure.  A 21 gauge needle was inserted into the gallbladder lumen under sonographic guidance and via transhepatic approach. It was removed over a 018 wire, which was upsized to a 3-J. A 10-French drain was advanced over the wire and coiled in the gallbladder lumen. It was sewn to the skin after being string fixed.  FINDINGS: The cystic duct is occluded.  COMPLICATIONS: None  IMPRESSION: Successful cholecystostomy. This needs to remain in place at least 6 weeks.   Electronically Signed   By: Marybelle Killings M.D.   On: 08/17/2014 16:19   Dg Chest Port 1 View  08/17/2014   CLINICAL DATA:  Dyspnea. Labored breathing sounds. 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 Borderline heart size and pulmonary vascularity are likely normal for technique. Diffuse interstitial pattern in the lungs possibly a chronic bronchitic changes. Linear atelectasis in the right lung base. No blunting of costophrenic angles. No apparent pneumothorax. Degenerative changes in the spine. Similar appearance to previous study.  IMPRESSION: Shallow inspiration with atelectasis in the lung bases. Probable chronic bronchitic changes.   Electronically Signed   By: Lucienne Capers M.D.   On: 08/17/2014 23:28   Dg Chest Port 1 View  08/17/2014   CLINICAL DATA:  Hypertension, COPD.  Shortness of Breath.  EXAM: PORTABLE CHEST - 1 VIEW  COMPARISON:  08/16/2014  FINDINGS: Mild elevation of the right  hemidiaphragm, stable. Right base atelectasis. Heart is borderline in size. Mild vascular congestion without overt edema. No effusions. No acute bony abnormality.  IMPRESSION: Borderline heart size, vascular congestion.  Right base atelectasis.   Electronically Signed   By: Rolm Baptise M.D.   On: 08/17/2014 14:24    Assessment/Plan: Cholecystitis S/p perc chole drain 2/24, functioning well Plans per Primary/[facility] Agree drain needs to remain minimum of 6 weeks    LOS: 2 days    Ascencion Dike PA-C 08/18/2014 10:49 AM

## 2014-08-18 NOTE — Progress Notes (Addendum)
[facility] Progress Note  Subjective:   Less RUQ pain since drain placed. Tol clears. Had some wheezing last pm. Repeat CXR last pm with shallow inspiration with atelectasis in lung bases, probable chronic bronchitic changes.Frustrated. Wants to get out of hospital asap.   Objective:  Vital signs in last 24 hours: Temp:  [97.7 F (36.5 C)-98.6 F (37 C)] 98 F (36.7 C) (02/25 0546) Pulse Rate:  [83-103] 84 (02/25 0736) Resp:  [18-26] 18 (02/25 0546) BP: (126-157)/(66-95) 126/66 mmHg (02/25 0546) SpO2:  [90 %-99 %] 93 % (02/25 0949) Last BM Date: 08/16/14 General:   Alert,  Well-developed,  in NAD Heart:  Regular rate and rhythm; no murmurs Pulm: expiratory wheezes, prolonged expiratory phase, decreased air movement Abdomen:  Soft, RUQ drain with purulent output, mild RUQ tenderness  Extremities:  Without edema. Neurologic:  Alert and  oriented x4;  grossly normal neurologically. Psych:  Alert and cooperative. Normal mood and affect.  Intake/Output from previous day: 02/24 0701 - 02/25 0700 In: 1154.2 [I.V.:1104.2; IV Piggyback:50] Out: 1115 [Urine:875; Drains:240] Intake/Output this shift: Total I/O In: 400 [I.V.:300; IV Piggyback:100] Out: 220 [Urine:200; Drains:20]  Lab Results:  Recent Labs  08/16/14 1040 08/17/14 0545 08/18/14 0510  WBC 20.9* 16.2* 15.2*  HGB 12.5* 11.4* 11.5*  HCT 37.5* 34.9* 35.7*  PLT 385 316 PLATELET CLUMPS NOTED ON SMEAR, COUNT APPEARS ADEQUATE   BMET  Recent Labs  08/16/14 1040 08/17/14 0545 08/18/14 0510  NA 136 140 142  K 3.6 3.1* 4.1  CL 103 109 114*  CO2 19 19 21   GLUCOSE 127* 101* 171*  BUN 29* 21 24*  CREATININE 0.90 0.80 1.12  CALCIUM 9.0 8.5 8.5   LFT  Recent Labs  08/18/14 0510  PROT 6.4  ALBUMIN 2.4*  AST 17  ALT 19  ALKPHOS 116  BILITOT 1.4*    Ct Abdomen Pelvis Wo Contrast  08/16/2014   CLINICAL DATA:  Two week history of diarrhea, nausea, and vomiting. Symptoms began shortly after  recent colonoscopy. History of COPD, hypertension, and hernia.  EXAM: CT ABDOMEN AND PELVIS WITHOUT CONTRAST  TECHNIQUE: Multidetector CT imaging of the abdomen and pelvis was performed following the standard protocol without IV contrast.  COMPARISON:  03/30/2013  FINDINGS: Evaluation of lungs is limited due to motion artifact. There appears to be prominence of the pulmonary vascularity suggesting pulmonary vascular congestion. Coronary artery calcifications. Multiple calcified lymph nodes in the mediastinum and hila bilaterally.  Multiple stones are present in the gallbladder. There is inflammatory infiltration around the gallbladder with small amount of gas in the gallbladder wall. There is an eccentric fluid collection suggesting hematoma or abscess adjacent to the anterior gallbladder wall. Enlarged gallbladder displaces the hepatic flexure of the colon medially. Edema in the adjacent liver. Stones are present in the common bile duct. No significant ductal dilatation. Appearance is likely due to acute gangrenous cholecystitis.  Unenhanced appearance of the liver, spleen, pancreas, adrenal glands, kidneys, inferior vena cava, and retroperitoneal lymph nodes is unremarkable. Calcification of the abdominal aorta without aneurysm. Stomach and small bowel are normal. No wall thickening or abnormal distention. Contrast material flows through to the colon without evidence of obstruction. Colon is diffusely contrast filled. There is inflammatory thickening of the hepatic flexure related to be adjacent gallbladder process appear otherwise, no focal colonic wall thickening. No colonic distention. Contrast material flows through to the rectum. Scattered diverticula in the colon. No free air or free fluid in the abdomen.  Pelvis:  Prostate gland is not enlarged. Vascular calcifications are present. Bladder wall is not thickened. Fat demonstrated in the inguinal canals. No pelvic mass or lymphadenopathy. No free or loculated  pelvic fluid collections. The appendix is normal. Degenerative changes in the lumbar spine. No destructive bone lesions.  IMPRESSION: Severe inflammatory changes involving the gallbladder and pericholecystic tissues with gas in the gallbladder wall, membranous changes versus abscess, and surrounding inflammatory reaction involving adjacent colon and liver. Multiple stones in the gallbladder. Stones in the common bile duct. Changes are consistent with gangrenous cholecystitis. Surgical consultation is recommended.   Electronically Signed   By: Lucienne Capers M.D.   On: 08/16/2014 17:28   Ir Perc Cholecystostomy  08/17/2014   CLINICAL DATA:  Acute cholecystitis  EXAM: CHOLECYSTOSTOMY  FLUOROSCOPY TIME:  1 minutes and 6 seconds.  MEDICATIONS AND MEDICAL HISTORY: Versed 0 mg, Fentanyl 50 mcg.  Additional Medications: Zosyn 3.375 g  ANESTHESIA/SEDATION: Moderate sedation time: 14 minutes  CONTRAST:  5 cc Omnipaque 300  PROCEDURE: The procedure, risks, benefits, and alternatives were explained to the patient. Questions regarding the procedure were encouraged and answered. The patient understands and consents to the procedure.  The right upper quadrant was prepped with Betadine in a sterile fashion, and a sterile drape was applied covering the operative field. A sterile gown and sterile gloves were used for the procedure.  A 21 gauge needle was inserted into the gallbladder lumen under sonographic guidance and via transhepatic approach. It was removed over a 018 wire, which was upsized to a 3-J. A 10-French drain was advanced over the wire and coiled in the gallbladder lumen. It was sewn to the skin after being string fixed.  FINDINGS: The cystic duct is occluded.  COMPLICATIONS: None  IMPRESSION: Successful cholecystostomy. This needs to remain in place at least 6 weeks.   Electronically Signed   By: Marybelle Killings M.D.   On: 08/17/2014 16:19   Dg Chest Port 1 View  08/17/2014   CLINICAL DATA:  Dyspnea. Labored  breathing sounds. 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 Borderline heart size and pulmonary vascularity are likely normal for technique. Diffuse interstitial pattern in the lungs possibly a chronic bronchitic changes. Linear atelectasis in the right lung base. No blunting of costophrenic angles. No apparent pneumothorax. Degenerative changes in the spine. Similar appearance to previous study.  IMPRESSION: Shallow inspiration with atelectasis in the lung bases. Probable chronic bronchitic changes.   Electronically Signed   By: Lucienne Capers M.D.   On: 08/17/2014 23:28   Dg Chest Port 1 View  08/17/2014   CLINICAL DATA:  Hypertension, COPD.  Shortness of Breath.  EXAM: PORTABLE CHEST - 1 VIEW  COMPARISON:  08/16/2014  FINDINGS: Mild elevation of the right hemidiaphragm, stable. Right base atelectasis. Heart is borderline in size. Mild vascular congestion without overt edema. No effusions. No acute bony abnormality.  IMPRESSION: Borderline heart size, vascular congestion.  Right base atelectasis.   Electronically Signed   By: Rolm Baptise M.D.   On: 08/17/2014 14:24    ASSESSMENT/PLAN:  S/P cholecystostomy due to acute calculous cholecystitis, feeling better. On IV antibiotics. LFTs improving. Pt noted to have choledocholithiasis on CT. ERCP planned when pulmonary status stabilized. On IV steroids, pulm toilet and nebulizers for COPD.    LOS: 2 days   Hvozdovic, Deloris Ping 08/18/2014, Pager [phone]     Attending physician's note   I  have taken an interval history, reviewed the chart and examined the patient. I agree with the Advanced Practitioner's note, impression and recommendations. RUQ pain has improved. LFTs improved. Known choledocholithiasis. Dyspneic with talking and has wheezes and suboptimal air movement on pulm exam this afternoon. ERCP for tomorrow is canceled as pulmonary status needs  to substantially improve before ERCP/sedation. Will see him again on Monday and reassess. If he is discharged before Monday he should follow up in the office with Dr. Scarlette Shorts.   Pricilla Riffle. Fuller Plan, MD [facility]

## 2014-08-19 ENCOUNTER — Inpatient Hospital Stay (HOSPITAL_COMMUNITY): Payer: Medicare Other

## 2014-08-19 ENCOUNTER — Encounter (HOSPITAL_COMMUNITY)
Admission: EM | Disposition: A | Discharge status: Home Health | Payer: Self-pay | Source: Home / Self Care | Attending: Internal Medicine

## 2014-08-19 DIAGNOSIS — J9620 Acute and chronic respiratory failure, unspecified whether with hypoxia or hypercapnia: Secondary | ICD-10-CM

## 2014-08-19 DIAGNOSIS — R062 Wheezing: Secondary | ICD-10-CM | POA: Insufficient documentation

## 2014-08-19 DIAGNOSIS — J9621 Acute and chronic respiratory failure with hypoxia: Secondary | ICD-10-CM

## 2014-08-19 DIAGNOSIS — Z01811 Encounter for preprocedural respiratory examination: Secondary | ICD-10-CM

## 2014-08-19 DIAGNOSIS — N179 Acute kidney failure, unspecified: Secondary | ICD-10-CM

## 2014-08-19 DIAGNOSIS — G4733 Obstructive sleep apnea (adult) (pediatric): Secondary | ICD-10-CM

## 2014-08-19 DIAGNOSIS — I348 Other nonrheumatic mitral valve disorders: Secondary | ICD-10-CM

## 2014-08-19 DIAGNOSIS — D869 Sarcoidosis, unspecified: Secondary | ICD-10-CM

## 2014-08-19 LAB — COMPREHENSIVE METABOLIC PANEL
ALBUMIN: 2.4 g/dL — AB (ref 3.5–5.2)
ALT: 20 U/L (ref 0–53)
AST: 23 U/L (ref 0–37)
Alkaline Phosphatase: 102 U/L (ref 39–117)
Anion gap: 10 (ref 5–15)
BUN: 33 mg/dL — ABNORMAL HIGH (ref 6–23)
CALCIUM: 8.9 mg/dL (ref 8.4–10.5)
CHLORIDE: 109 mmol/L (ref 96–112)
CO2: 22 mmol/L (ref 19–32)
Creatinine, Ser: 1.55 mg/dL — ABNORMAL HIGH (ref 0.50–1.35)
GFR calc Af Amer: 49 mL/min — ABNORMAL LOW (ref >90)
GFR, EST NON AFRICAN AMERICAN: 42 mL/min — AB (ref >90)
Glucose, Bld: 182 mg/dL — ABNORMAL HIGH (ref 70–99)
Potassium: 3.8 mmol/L (ref 3.5–5.1)
SODIUM: 141 mmol/L (ref 135–145)
Total Bilirubin: 1.1 mg/dL (ref 0.3–1.2)
Total Protein: 6.5 g/dL (ref 6.0–8.3)

## 2014-08-19 LAB — CREATININE, URINE, RANDOM: Creatinine, Urine: 102.69 mg/dL

## 2014-08-19 LAB — CBC WITH DIFFERENTIAL/PLATELET
BASOS ABS: 0 10*3/uL (ref 0.0–0.1)
BASOS PCT: 0 % (ref 0–1)
EOS PCT: 0 % (ref 0–5)
Eosinophils Absolute: 0 10*3/uL (ref 0.0–0.7)
HEMATOCRIT: 37.7 % — AB (ref 39.0–52.0)
HEMOGLOBIN: 12.2 g/dL — AB (ref 13.0–17.0)
Lymphocytes Relative: 4 % — ABNORMAL LOW (ref 12–46)
Lymphs Abs: 0.7 10*3/uL (ref 0.7–4.0)
MCH: 27.5 pg (ref 26.0–34.0)
MCHC: 32.4 g/dL (ref 30.0–36.0)
MCV: 85.1 fL (ref 78.0–100.0)
MONOS PCT: 3 % (ref 3–12)
Monocytes Absolute: 0.5 10*3/uL (ref 0.1–1.0)
NEUTROS ABS: 15.3 10*3/uL — AB (ref 1.7–7.7)
Neutrophils Relative %: 93 % — ABNORMAL HIGH (ref 43–77)
Platelets: 368 10*3/uL (ref 150–400)
RBC: 4.43 MIL/uL (ref 4.22–5.81)
RDW: 14.8 % (ref 11.5–15.5)
WBC: 16.5 10*3/uL — ABNORMAL HIGH (ref 4.0–10.5)

## 2014-08-19 LAB — URINALYSIS, ROUTINE W REFLEX MICROSCOPIC
Bilirubin Urine: NEGATIVE
GLUCOSE, UA: NEGATIVE mg/dL
Ketones, ur: NEGATIVE mg/dL
LEUKOCYTES UA: NEGATIVE
NITRITE: NEGATIVE
PROTEIN: NEGATIVE mg/dL
Specific Gravity, Urine: 1.019 (ref 1.005–1.030)
UROBILINOGEN UA: 0.2 mg/dL (ref 0.0–1.0)
pH: 5.5 (ref 5.0–8.0)

## 2014-08-19 LAB — URINE MICROSCOPIC-ADD ON

## 2014-08-19 LAB — BLOOD GAS, ARTERIAL
Acid-base deficit: 4.8 mmol/L — ABNORMAL HIGH (ref 0.0–2.0)
BICARBONATE: 19.1 meq/L — AB (ref 20.0–24.0)
O2 Content: 2 L/min
O2 SAT: 93.4 %
Patient temperature: 98.6
TCO2: 16.9 mmol/L (ref 0–100)
pCO2 arterial: 33.5 mmHg — ABNORMAL LOW (ref 35.0–45.0)
pH, Arterial: 7.373 (ref 7.350–7.450)
pO2, Arterial: 71.8 mmHg — ABNORMAL LOW (ref 80.0–100.0)

## 2014-08-19 LAB — BRAIN NATRIURETIC PEPTIDE: B NATRIURETIC PEPTIDE 5: 460.9 pg/mL — AB (ref 0.0–100.0)

## 2014-08-19 LAB — PROCALCITONIN: Procalcitonin: 0.46 ng/mL

## 2014-08-19 LAB — SODIUM, URINE, RANDOM: Sodium, Ur: 12 mmol/L

## 2014-08-19 LAB — TROPONIN I: Troponin I: <0.03 ng/mL (ref <0.031)

## 2014-08-19 LAB — LACTIC ACID, PLASMA: Lactic Acid, Venous: 0.8 mmol/L (ref 0.5–2.0)

## 2014-08-19 SURGERY — ERCP, WITH INTERVENTION IF INDICATED
Anesthesia: Moderate Sedation

## 2014-08-19 SURGERY — ERCP, WITH INTERVENTION IF INDICATED
Anesthesia: General

## 2014-08-19 MED ORDER — SODIUM CHLORIDE 0.9 % IV BOLUS (SEPSIS)
500.0000 mL | Freq: Once | INTRAVENOUS | Status: AC
  Administered 2014-08-19: 500 mL via INTRAVENOUS

## 2014-08-19 MED ORDER — MAGNESIUM SULFATE 2 GM/50ML IV SOLN
2.0000 g | Freq: Once | INTRAVENOUS | Status: AC
  Administered 2014-08-19: 2 g via INTRAVENOUS
  Filled 2014-08-19: qty 50

## 2014-08-19 MED ORDER — GABAPENTIN 300 MG PO CAPS
300.0000 mg | ORAL_CAPSULE | Freq: Every day | ORAL | Status: DC | PRN
  Filled 2014-08-19: qty 1

## 2014-08-19 MED ORDER — LEVALBUTEROL HCL 0.63 MG/3ML IN NEBU
0.6300 mg | INHALATION_SOLUTION | RESPIRATORY_TRACT | Status: DC
  Administered 2014-08-19 – 2014-08-25 (×35): 0.63 mg via RESPIRATORY_TRACT
  Filled 2014-08-19 (×36): qty 3

## 2014-08-19 MED ORDER — IPRATROPIUM BROMIDE 0.02 % IN SOLN
0.5000 mg | RESPIRATORY_TRACT | Status: DC
  Administered 2014-08-19 – 2014-08-25 (×35): 0.5 mg via RESPIRATORY_TRACT
  Filled 2014-08-19 (×36): qty 2.5

## 2014-08-19 MED ORDER — PERFLUTREN LIPID MICROSPHERE
1.0000 mL | INTRAVENOUS | Status: AC | PRN
  Administered 2014-08-19: 2 mL via INTRAVENOUS
  Filled 2014-08-19: qty 10

## 2014-08-19 MED ORDER — FENTANYL CITRATE 0.05 MG/ML IJ SOLN
12.5000 ug | INTRAMUSCULAR | Status: DC | PRN
  Administered 2014-08-21: 12.5 ug via INTRAVENOUS
  Filled 2014-08-19: qty 2

## 2014-08-19 NOTE — Progress Notes (Signed)
  Echocardiogram 2D Echocardiogram with Definity has been performed.  [name] 08/19/2014, 2:44 PM

## 2014-08-19 NOTE — Progress Notes (Addendum)
[facility] PROGRESS NOTE  Travis Ferguson TWS:[id number] DOB: [birth year] DOA: 08/16/2014 PCP: Jerlyn Ly, MD  Assessment/Plan: #1 sepsis Secondary to gangrenous cholecystitis noted on CT of the abdomen and pelvis. Patient met criteria for sepsis secondary to tachycardia, leukocytosis, increased respiratory rate. C. difficile PCR was negative. Blood cultures are pending. Patient is status post percutaneous cholecystostomy drain with fluid cultures pending. Continue empiric IV Zosyn. General surgery and GI following.  #2 gangrenous cholecystitis Due to patient's comorbidities and pulmonary status was recommended by general surgery that patient undergo percutaneous cholecystostomy which was done per interventional radiology. Body fluid cultures are pending. Patient has been pancultured. Continue empiric IV Zosyn. Follow.  #3 acute COPD exacerbation/sarcoidosis Patient with both expiratory and inspiratory wheezing on examination. Continue IV steroids, scheduled nebulizers, oxygen, pulmonary toilet. Pulmonary has been consulted. Per pulmonary.  #4 choledocholithiasis per CT of the abdomen and pelvis.  Patient will need an ERCP for common bile duct stone retrieval per GI once his pulmonary status has improved. GI following and appreciate input and recommendations.  #5 history of diastolic CHF Stable. Follow.  #6 hypertension Stable.  #7 hyperlipidemia Continue statin.  #8 acute renal failure Likely secondary to prerenal azotemia. Patient has been nothing by mouth since admission. We'll give a bolus of IV fluids. Increase IV fluids 200 mL per hour. Check urine sodium. Check urine creatinine. Follow. Monitor for volume overload.  #9 prophylaxis Heparin for DVT prophylaxis.   Code Status: Full Family Communication: Updated patient, wife at bedside. Disposition Plan: Remain inpatient.   Consultants:  Gen. surgery: Dr. Hunt Oris 08/16/2014  Gastroenterology: Dr. Fuller Plan  08/17/2014  Interventional radiology: Dr. Barbie Banner 08/17/2014  Pulmonary: Dr. Chase Caller 08/19/2014  Procedures:  CT abdomen and pelvis 08/16/2014  Chest x-ray 08/16/2014  Status post percutaneous cholecystostomy 08/17/2014 per Dr. Barbie Banner  Antibiotics:  IV Zosyn 08/17/2014  IV Flagyl 08/16/2014>>>>> 08/16/2014  IV ciprofloxacin 08/16/2014>>> 08/16/2014  IV Unasyn 08/16/2014>>>> 08/17/2014  HPI/Subjective: Patient states feeling better today. Patient states pain is better controlled. Patient feels wheezing is improving and that his baseline..   Objective: Filed Vitals:   08/19/14 1024  BP: 140/86  Pulse: 98  Temp: 97.6 F (36.4 C)  Resp: 20    Intake/Output Summary (Last 24 hours) at 08/19/14 1411 Last data filed at 08/19/14 1400  Gross per 24 hour  Intake 3359.95 ml  Output   1565 ml  Net 1794.95 ml   Filed Weights   08/16/14 1500  Weight: 119.296 kg (263 lb)    Exam:   General:  NAD  Cardiovascular: RRR  Respiratory: decreased Inspiratory and expiratory wheezing. No crackles.  Abdomen: Soft, nontender, nondistended, positive bowel sounds. Percutaneous drain with purulent brown output.  Musculoskeletal: No clubbing cyanosis or edema.  Data Reviewed: Basic Metabolic Panel:  Recent Labs Lab 08/16/14 1040 08/17/14 0545 08/17/14 0856 08/18/14 0510 08/19/14 0552  NA 136 140  --  142 141  K 3.6 3.1*  --  4.1 3.8  CL 103 109  --  114* 109  CO2 19 19  --  21 22  GLUCOSE 127* 101*  --  171* 182*  BUN 29* 21  --  24* 33*  CREATININE 0.90 0.80  --  1.12 1.55*  CALCIUM 9.0 8.5  --  8.5 8.9  MG  --   --  1.6 1.8  --    Liver Function Tests:  Recent Labs Lab 08/16/14 1040 08/17/14 0545 08/18/14 0510 08/19/14 0552  AST _0 ALT 26  _0 ALKPHOS 177* 130* 116 102  BILITOT 2.5* 1.8* 1.4* 1.1  PROT 7.2 6.2 6.4 6.5  ALBUMIN 2.8* 2.4* 2.4* 2.4*   No results for input(s): LIPASE, AMYLASE in the last 168 hours. No results for input(s):  AMMONIA in the last 168 hours. CBC:  Recent Labs Lab 08/16/14 1040 08/17/14 0545 08/18/14 0510 08/19/14 0552  WBC 20.9* 16.2* 15.2* 16.5*  NEUTROABS 17.7*  --  14.2* 15.3*  HGB 12.5* 11.4* 11.5* 12.2*  HCT 37.5* 34.9* 35.7* 37.7*  MCV 82.2 83.5 85.2 85.1  PLT 385 316 PLATELET CLUMPS NOTED ON SMEAR, COUNT APPEARS ADEQUATE 368   Cardiac Enzymes:  Recent Labs Lab 08/19/14 1040  TROPONINI <0.03   BNP (last 3 results)  Recent Labs  08/19/14 1040  BNP 460.9*    ProBNP (last 3 results)  Recent Labs  03/17/14 1305  PROBNP 235.0*    CBG: No results for input(s): GLUCAP in the last 168 hours.  Recent Results (from the past 240 hour(s))  Blood culture (routine x 2)     Status: None (Preliminary result)   Collection Time: 08/16/14 12:27 PM  Result Value Ref Range Status   Specimen Description BLOOD RIGHT HAND  Final   Special Requests   Final    BOTTLES DRAWN AEROBIC AND ANAEROBIC 5CC BOTH BOTTLES   Culture   Final           BLOOD CULTURE RECEIVED NO GROWTH TO DATE CULTURE WILL BE HELD FOR 5 DAYS BEFORE ISSUING A FINAL NEGATIVE REPORT Performed at [facility]    Report Status PENDING  Incomplete  Blood culture (routine x 2)     Status: None (Preliminary result)   Collection Time: 08/16/14 12:27 PM  Result Value Ref Range Status   Specimen Description BLOOD RIGHT ARM  Final   Special Requests   Final    BOTTLES DRAWN AEROBIC AND ANAEROBIC 4CC BOTH BOTTLES   Culture   Final           BLOOD CULTURE RECEIVED NO GROWTH TO DATE CULTURE WILL BE HELD FOR 5 DAYS BEFORE ISSUING A FINAL NEGATIVE REPORT Performed at [facility]    Report Status PENDING  Incomplete  Clostridium Difficile by PCR     Status: None   Collection Time: 08/16/14  3:11 PM  Result Value Ref Range Status   C difficile by pcr NEGATIVE NEGATIVE Final    Comment: Performed at [facility]  Stool culture     Status: None (Preliminary result)   Collection Time: 08/16/14  3:11  PM  Result Value Ref Range Status   Specimen Description STOOL  Final   Special Requests NONE  Final   Culture   Final    NO SUSPICIOUS COLONIES, CONTINUING TO HOLD Performed at [facility]    Report Status PENDING  Incomplete  Body fluid culture     Status: None (Preliminary result)   Collection Time: 08/17/14  2:18 PM  Result Value Ref Range Status   Specimen Description GALL BLADDER  Final   Special Requests NONE  Final   Gram Stain   Final    ABUNDANT WBC PRESENT,BOTH PMN AND MONONUCLEAR NO SQUAMOUS EPITHELIAL CELLS SEEN FEW GRAM POSITIVE COCCI IN PAIRS FEW GRAM NEGATIVE RODS Performed at [facility]    Culture   Final    MULTIPLE ORGANISMS PRESENT, NONE PREDOMINANT Performed at [facility]    Report Status PENDING  Incomplete  Studies: Dg Chest Port 1 View  08/19/2014   CLINICAL DATA:  Wheezing  EXAM: PORTABLE CHEST - 1 VIEW  COMPARISON:  August 17, 2014  FINDINGS: There is patchy atelectasis in each lung base, slightly more on the right than on the left, stable. There is no frank edema or consolidation. Heart is upper normal in size with pulmonary vascularity within normal limits. No adenopathy. No bone lesions.  IMPRESSION: Bibasilar atelectasis. No frank edema or consolidation. No appreciable change compared to recent prior study.   Electronically Signed   By: Lowella Grip III M.D.   On: 08/19/2014 10:11   Dg Chest Port 1 View  08/17/2014   CLINICAL DATA:  Dyspnea. Labored breathing sounds. 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 Borderline heart size and pulmonary vascularity are likely normal for technique. Diffuse interstitial pattern in the lungs possibly a chronic bronchitic changes. Linear atelectasis in the right lung base. No blunting of costophrenic angles. No apparent pneumothorax. Degenerative changes in the spine. Similar  appearance to previous study.  IMPRESSION: Shallow inspiration with atelectasis in the lung bases. Probable chronic bronchitic changes.   Electronically Signed   By: Lucienne Capers M.D.   On: 08/17/2014 23:28   Dg Chest Port 1 View  08/17/2014   CLINICAL DATA:  Hypertension, COPD.  Shortness of Breath.  EXAM: PORTABLE CHEST - 1 VIEW  COMPARISON:  08/16/2014  FINDINGS: Mild elevation of the right hemidiaphragm, stable. Right base atelectasis. Heart is borderline in size. Mild vascular congestion without overt edema. No effusions. No acute bony abnormality.  IMPRESSION: Borderline heart size, vascular congestion.  Right base atelectasis.   Electronically Signed   By: Rolm Baptise M.D.   On: 08/17/2014 14:24    Scheduled Meds: . finasteride  5 mg Oral QPM  . FLUoxetine  40 mg Oral Q breakfast  . heparin  5,000 Units Subcutaneous 3 times per day  . ipratropium  0.5 mg Nebulization Q4H  . levalbuterol  0.63 mg Nebulization Q4H  . methylPREDNISolone (SOLU-MEDROL) injection  125 mg Intravenous 3 times per day  . metoprolol tartrate  25 mg Oral Q breakfast  . piperacillin-tazobactam (ZOSYN)  IV  3.375 g Intravenous 3 times per day  . simvastatin  20 mg Oral QPM  . traMADol  100 mg Oral 3 times per day  . traZODone  100 mg Oral QHS   Continuous Infusions: . sodium chloride 100 mL/hr at 08/19/14 1028    Principal Problem:   Sepsis Active Problems:   Gangrenous cholecystitis   Cholecystitis, acute with cholelithiasis   COPD exacerbation   COPD with chronic bronchitis   Essential hypertension   Hyperlipidemia   Diastolic CHF   Hypokalemia   Calculus of gallbladder with acute cholecystitis and obstruction   Choledocholithiasis   Acute on chronic respiratory failure   OSA (obstructive sleep apnea)   Preop respiratory exam   Acute kidney injury   Sarcoid   Wheezing    Time spent: 65 minutes    Agapita Savarino M.D. [facility] Pager [phone]. If 7PM-7AM, please contact  night-coverage at [email or web address], password [facility] 08/19/2014, 2:11 PM  LOS: 3 days

## 2014-08-19 NOTE — Progress Notes (Signed)
Subjective: Wheezing and waiting for decision from GI on ERCP. Note yesterday from Dr. Fuller Plan says they are note doing ERCP today, and they will see him on Monday.  Dr. Halford Chessman is his Pulmonary doctor.  Objective: Vital signs in last 24 hours: Temp:  [97.6 F (36.4 C)-97.7 F (36.5 C)] 97.6 F (36.4 C) (02/26 0505) Pulse Rate:  [68-93] 93 (02/26 0505) Resp:  [18-20] 20 (02/26 0505) BP: (124-136)/(71-88) 128/71 mmHg (02/26 0505) SpO2:  [91 %-95 %] 91 % (02/26 0614) Last BM Date: 08/17/14 PO 460 recorded  NPO currently Drain 85 ml recorded. Afebrile, VSS  Creatinine is up, LFT's OK, WBC still 16.5 with left shift.   Intake/Output from previous day: 02/25 0701 - 02/26 0700 In: 2506.3 [P.O.:460; I.V.:1796.3; IV Piggyback:250] Out: 1585 [Urine:1500; Drains:85] Intake/Output this shift:    General appearance: alert, cooperative and no distress Resp: wheezes bilaterally GI: soft, no complaints of pain,   Lab Results:   Recent Labs  08/18/14 0510 08/19/14 0552  WBC 15.2* 16.5*  HGB 11.5* 12.2*  HCT 35.7* 37.7*  PLT PLATELET CLUMPS NOTED ON SMEAR, COUNT APPEARS ADEQUATE 368    BMET  Recent Labs  08/18/14 0510 08/19/14 0552  NA 142 141  K 4.1 3.8  CL 114* 109  CO2 21 22  GLUCOSE 171* 182*  BUN 24* 33*  CREATININE 1.12 1.55*  CALCIUM 8.5 8.9   PT/INR No results for input(s): LABPROT, INR in the last 72 hours.   Recent Labs Lab 08/16/14 1040 08/17/14 0545 08/18/14 0510 08/19/14 0552  AST 25 22 17 23   ALT 26 22 19 20   ALKPHOS 177* 130* 116 102  BILITOT 2.5* 1.8* 1.4* 1.1  PROT 7.2 6.2 6.4 6.5  ALBUMIN 2.8* 2.4* 2.4* 2.4*     Lipase  No results found for: LIPASE   Studies/Results: Ir Perc Cholecystostomy  08/17/2014   CLINICAL DATA:  Acute cholecystitis  EXAM: CHOLECYSTOSTOMY  FLUOROSCOPY TIME:  1 minutes and 6 seconds.  MEDICATIONS AND MEDICAL HISTORY: Versed 0 mg, Fentanyl 50 mcg.  Additional Medications: Zosyn 3.375 g  ANESTHESIA/SEDATION:  Moderate sedation time: 14 minutes  CONTRAST:  5 cc Omnipaque 300  PROCEDURE: The procedure, risks, benefits, and alternatives were explained to the patient. Questions regarding the procedure were encouraged and answered. The patient understands and consents to the procedure.  The right upper quadrant was prepped with Betadine in a sterile fashion, and a sterile drape was applied covering the operative field. A sterile gown and sterile gloves were used for the procedure.  A 21 gauge needle was inserted into the gallbladder lumen under sonographic guidance and via transhepatic approach. It was removed over a 018 wire, which was upsized to a 3-J. A 10-French drain was advanced over the wire and coiled in the gallbladder lumen. It was sewn to the skin after being string fixed.  FINDINGS: The cystic duct is occluded.  COMPLICATIONS: None  IMPRESSION: Successful cholecystostomy. This needs to remain in place at least 6 weeks.   Electronically Signed   By: Marybelle Killings M.D.   On: 08/17/2014 16:19   Dg Chest Port 1 View  08/17/2014   CLINICAL DATA:  Dyspnea. Labored breathing sounds. 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 Borderline heart size and pulmonary vascularity are likely normal for technique. Diffuse interstitial pattern in the lungs possibly a chronic bronchitic changes. Linear atelectasis in the right lung base. No blunting  of costophrenic angles. No apparent pneumothorax. Degenerative changes in the spine. Similar appearance to previous study.  IMPRESSION: Shallow inspiration with atelectasis in the lung bases. Probable chronic bronchitic changes.   Electronically Signed   By: Lucienne Capers M.D.   On: 08/17/2014 23:28   Dg Chest Port 1 View  08/17/2014   CLINICAL DATA:  Hypertension, COPD.  Shortness of Breath.  EXAM: PORTABLE CHEST - 1 VIEW  COMPARISON:  08/16/2014  FINDINGS: Mild elevation of the right  hemidiaphragm, stable. Right base atelectasis. Heart is borderline in size. Mild vascular congestion without overt edema. No effusions. No acute bony abnormality.  IMPRESSION: Borderline heart size, vascular congestion.  Right base atelectasis.   Electronically Signed   By: Rolm Baptise M.D.   On: 08/17/2014 14:24    Medications: . budesonide-formoterol  2 puff Inhalation BID  . finasteride  5 mg Oral QPM  . FLUoxetine  40 mg Oral Q breakfast  . heparin  5,000 Units Subcutaneous 3 times per day  . indomethacin  100 mg Rectal Once  . ipratropium  0.5 mg Nebulization Q4H WA  . levalbuterol  0.63 mg Nebulization Q4H  . methylPREDNISolone (SOLU-MEDROL) injection  125 mg Intravenous 3 times per day  . metoprolol tartrate  25 mg Oral Q breakfast  . piperacillin-tazobactam (ZOSYN)  IV  3.375 g Intravenous 3 times per day  . simvastatin  20 mg Oral QPM  . traMADol  100 mg Oral 3 times per day  . traZODone  100 mg Oral QHS    Assessment/Plan 1.  Acute calculous cholecystitis with surround phlegmon s/p cholecystostomy (Dr. Barbie Banner) 08/17/14, -  on IV abxs;  Cipro/Flagyl 2/23, Zosyn started 08/17/14. 2.   Choledocholithiasis on CT-LFTs normalizing; ERCP planned when pulmonary status more stable. 3.  COPD with chronic bronchitis 4.  Essential hypertension 5.   Hx of Diastolic CHF 6.  DVT prophylaxis: Heparin/SCD 7.   Hyperlipidemia 8.  Body mass index is 35.66 kg/(m^2).    Plan:  He seems to be better with drain, no nausea, or vomiting, no complaints of back pain.  Pt seems to want Dr. Halford Chessman to help with his pulmonary care.  I talked with Dr. Grandville Silos.  Per Dr. Lynne Leader note I don't think GI will see again till Monday.       LOS: 3 days    Kellyjo Edgren 08/19/2014

## 2014-08-19 NOTE — Progress Notes (Signed)
Subjective: Pt doing fairly well; still has some RUQ discomfort but improved; wheezing earlier today; denies N/V  Objective: Vital signs in last 24 hours: Temp:  [97.6 F (36.4 C)] 97.6 F (36.4 C) (02/26 1024) Pulse Rate:  [68-98] 98 (02/26 1024) Resp:  [18-20] 20 (02/26 1024) BP: (124-140)/(71-86) 140/86 mmHg (02/26 1024) SpO2:  [91 %-100 %] 100 % (02/26 1330) Last BM Date: 08/17/14  Intake/Output from previous day: 02/25 0701 - 02/26 0700 In: 2506.3 [P.O.:460; I.V.:1796.3; IV Piggyback:250] Out: 1585 [Urine:1500; Drains:85] Intake/Output this shift: Total I/O In: 1493.7 [P.O.:240; I.V.:1253.7] Out: 675 [Urine:675]  GB drain intact, insertion site ok, mildly tender, output 85 cc's , cx's pend  Lab Results:   Recent Labs  08/18/14 0510 08/19/14 0552  WBC 15.2* 16.5*  HGB 11.5* 12.2*  HCT 35.7* 37.7*  PLT PLATELET CLUMPS NOTED ON SMEAR, COUNT APPEARS ADEQUATE 368   BMET  Recent Labs  08/18/14 0510 08/19/14 0552  NA 142 141  K 4.1 3.8  CL 114* 109  CO2 21 22  GLUCOSE 171* 182*  BUN 24* 33*  CREATININE 1.12 1.55*  CALCIUM 8.5 8.9   PT/INR No results for input(s): LABPROT, INR in the last 72 hours. ABG  Recent Labs  08/19/14 1329  PHART 7.373  HCO3 19.1*    Studies/Results: Dg Chest Port 1 View  08/19/2014   CLINICAL DATA:  Wheezing  EXAM: PORTABLE CHEST - 1 VIEW  COMPARISON:  August 17, 2014  FINDINGS: There is patchy atelectasis in each lung base, slightly more on the right than on the left, stable. There is no frank edema or consolidation. Heart is upper normal in size with pulmonary vascularity within normal limits. No adenopathy. No bone lesions.  IMPRESSION: Bibasilar atelectasis. No frank edema or consolidation. No appreciable change compared to recent prior study.   Electronically Signed   By: Lowella Grip III M.D.   On: 08/19/2014 10:11   Dg Chest Port 1 View  08/17/2014   CLINICAL DATA:  Dyspnea. Labored breathing sounds. 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  EXAM: PORTABLE CHEST - 1 VIEW  COMPARISON:  08/17/2014  FINDINGS: Shallow inspiration. Borderline heart size and pulmonary vascularity are likely normal for technique. Diffuse interstitial pattern in the lungs possibly a chronic bronchitic changes. Linear atelectasis in the right lung base. No blunting of costophrenic angles. No apparent pneumothorax. Degenerative changes in the spine. Similar appearance to previous study.  IMPRESSION: Shallow inspiration with atelectasis in the lung bases. Probable chronic bronchitic changes.   Electronically Signed   By: Lucienne Capers M.D.   On: 08/17/2014 23:28    Anti-infectives: Anti-infectives    Start     Dose/Rate Route Frequency Ordered Stop   08/17/14 1200  piperacillin-tazobactam (ZOSYN) IVPB 3.375 g     3.375 g 12.5 mL/hr over 240 Minutes Intravenous 3 times per day 08/17/14 0811     08/16/14 1900  Ampicillin-Sulbactam (UNASYN) 3 g in sodium chloride 0.9 % 100 mL IVPB  Status:  Discontinued     3 g 100 mL/hr over 60 Minutes Intravenous Every 6 hours 08/16/14 1804 08/17/14 0811   08/16/14 1500  metroNIDAZOLE (FLAGYL) IVPB 500 mg  Status:  Discontinued     500 mg 100 mL/hr over 60 Minutes Intravenous 3 times per day 08/16/14 1348 08/16/14 1735   08/16/14 1400  ciprofloxacin (CIPRO) IVPB 400 mg  Status:  Discontinued     400 mg 200 mL/hr over 60 Minutes Intravenous Every 12 hours 08/16/14 1346  08/16/14 1735   08/16/14 1345  metroNIDAZOLE (FLAGYL) IVPB 500 mg  Status:  Discontinued     500 mg 100 mL/hr over 60 Minutes Intravenous Every 8 hours 08/16/14 1331 08/16/14 1348      Assessment/Plan: S/p perc cholecystostomy 2/24; WBC 16.5; bile cx's pend; plans as per CCS/GI; GB drain should remain in place at least 4-6 weeks to allow for tract maturation before removal unless cholecystectomy done in interim; improved pericholecystic inflamm on today's CT  LOS: 3 days   15 mins were spent evaluating pt with  percutaneous cholecystostomy ALLRED,D [facility] 08/19/2014

## 2014-08-19 NOTE — Progress Notes (Signed)
Patient refuses nocturnal CPAP tonight. He states he is not totally opposed to the use, but prefers to "talk it over" with the MD in the morning. He complains that his breathing medications are not on the same schedule as he uses at home and blames his increased wheezing on the RT staff for not ensuring he uses his inhalers at the same time he uses at home. He further explains that he is a patient of Dr Halford Chessman and that after his sleep study, it was found to be not necessary to utilize CPAP. Education provided, but patient continues to refuse to attempt CPAP for tonight. However, he states that he will discuss with the doctor tomorrow and then he "might try a machine". RT will continue to follow.

## 2014-08-19 NOTE — Consult Note (Addendum)
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 Admitted 08/16/2014 for gangrenous cholecystits and s/p perc drain 08/17/14. Has bililary stones and needs ECRP but held off due to ongoing wheezing despite IV solumedrol start 08/18/14 and PCCM consulted 08/19/14 for both wheezing and preop pulm eval. Note: he has a hx of persistent wheezing sometimes needing multiple pred couses to resolve. He himself feels respiratory status is stable but he is also noted to be a poor historian and noted to have NEW AKI 08/19/2014   SIGNIFICANT EVENTS: 08/17/14 - s/p cholecystostomy - perc drain - plan for 6 weeks       has a past medical history of COPD (chronic obstructive pulmonary disease); Depression; PTSD (post-traumatic stress disorder); OSA (obstructive sleep apnea); Migraine; GERD (gastroesophageal reflux disease); Dyslipidemia; Rhinitis; Cervical disc disease; Subdural hematoma (11/10); Hypertension; Coronary artery calcification seen on CAT scan; Sarcoidosis; Atherosclerosis; Dyspnea; Back pain; and Inguinal hernia.   reports that he quit smoking about 19 years ago. His smoking use included Pipe. He has never used smokeless tobacco.  Past Surgical History  Procedure Laterality Date  . Craniotomy  11/10    right frontal  . Video bronchoscopy  07/28/2012    Procedure: VIDEO BRONCHOSCOPY WITH FLUORO;  Surgeon: Chesley Mires, MD;  Location: [facility];  Service: Cardiopulmonary;  Laterality: Bilateral;  . Hernia repair Bilateral     right and left inguinal  . Knee  arthroscopy Right   . Lumbar laminectomy/decompression microdiscectomy Right 12/15/2012    Procedure: LUMBAR LAMINECTOMY/DECOMPRESSION MICRODISCECTOMY 1 LEVEL;  Surgeon: Elaina Hoops, MD;  Location: [place] [facility];  Service: Neurosurgery;  Laterality: Right;  Right Lumbar four-five laminectomy/foraminotomy    Allergies  Allergen Reactions  . Statins Other (See Comments)    Muscle aches - tolerating Vytorin    Immunization History  Administered Date(s) Administered  . Influenza Split 01/23/2011, 03/24/2013, 04/05/2014  . Influenza Whole 03/27/2009, 03/16/2010, 02/23/2012  . Pneumococcal Polysaccharide-23 04/27/2007, 10/22/2012    Family History  Problem Relation Age of Onset  . Coronary artery disease    . Colon cancer Neg Hx   . Pancreatic cancer Neg Hx   . Rectal cancer Neg Hx   . Esophageal cancer Neg Hx   . Stomach cancer Paternal Grandmother   . Heart disease Mother   . Heart disease Father   . Colon polyps Brother   . Congestive Heart Failure Brother      Current facility-administered medications:  .  0.9 %  sodium chloride infusion, , Intravenous, Continuous, Eugenie Filler, MD, Last Rate: 100 mL/hr at 08/19/14 0800 .  acetaminophen (TYLENOL) tablet 650 mg, 650 mg, Oral, Q6H PRN **OR** acetaminophen (TYLENOL) suppository 650 mg, 650 mg, Rectal, Q6H PRN, Donne Hazel, MD .  budesonide-formoterol [facility]) 160-4.5 MCG/ACT inhaler 2 puff, 2 puff, Inhalation, BID, Donne Hazel, MD, 2 puff at 08/19/14 0920 .  finasteride (PROSCAR) tablet 5 mg, 5 mg, Oral, QPM, Donne Hazel, MD, 5 mg at 08/18/14 1738 .  FLUoxetine (PROZAC) capsule 40 mg, 40 mg, Oral,  Q breakfast, Donne Hazel, MD, 40 mg at 08/19/14 0732 .  gabapentin (NEURONTIN) capsule 900 mg, 900 mg, Oral, Daily PRN, Donne Hazel, MD .  heparin injection 5,000 Units, 5,000 Units, Subcutaneous, 3 times per day, Donne Hazel, MD, 5,000 Units at 08/19/14 0510 .  HYDROcodone-acetaminophen (NORCO/VICODIN) 5-325 MG  per tablet 2 tablet, 2 tablet, Oral, BID PRN, Donne Hazel, MD, 2 tablet at 08/18/14 1933 .  indomethacin (INDOCIN) 50 MG suppository 100 mg, 100 mg, Rectal, Once, Willia Craze, NP, 100 mg at 08/19/14 1200 .  ipratropium (ATROVENT) nebulizer solution 0.5 mg, 0.5 mg, Nebulization, Q2H PRN, Irine Seal V, MD .  ipratropium (ATROVENT) nebulizer solution 0.5 mg, 0.5 mg, Nebulization, Q4H WA, Eugenie Filler, MD, 0.5 mg at 08/19/14 0920 .  levalbuterol (XOPENEX) nebulizer solution 0.63 mg, 0.63 mg, Nebulization, Q4H, Eugenie Filler, MD, 0.63 mg at 08/19/14 0920 .  levalbuterol (XOPENEX) nebulizer solution 0.63 mg, 0.63 mg, Nebulization, Q2H PRN, Eugenie Filler, MD .  LORazepam (ATIVAN) injection 0.5 mg, 0.5 mg, Intravenous, Q8H PRN, Eugenie Filler, MD, 0.5 mg at 08/18/14 2124 .  methylPREDNISolone sodium succinate (SOLU-MEDROL) 125 mg/2 mL injection 125 mg, 125 mg, Intravenous, 3 times per day, Eugenie Filler, MD, 125 mg at 08/19/14 0510 .  metoprolol tartrate (LOPRESSOR) tablet 25 mg, 25 mg, Oral, Q breakfast, Donne Hazel, MD, 25 mg at 08/19/14 0732 .  morphine 2 MG/ML injection 2 mg, 2 mg, Intravenous, Q4H PRN, Donne Hazel, MD, 2 mg at 08/17/14 1313 .  ondansetron (ZOFRAN) tablet 4 mg, 4 mg, Oral, Q6H PRN **OR** ondansetron (ZOFRAN) injection 4 mg, 4 mg, Intravenous, Q6H PRN, Donne Hazel, MD .  piperacillin-tazobactam (ZOSYN) IVPB 3.375 g, 3.375 g, Intravenous, 3 times per day, Jackolyn Confer, MD, 3.375 g at 08/19/14 0510 .  simvastatin (ZOCOR) tablet 20 mg, 20 mg, Oral, QPM, Donne Hazel, MD, 20 mg at 08/18/14 1739 .  traMADol (ULTRAM) tablet 100 mg, 100 mg, Oral, 3 times per day, Eugenie Filler, MD, 100 mg at 08/19/14 0510 .  traZODone (DESYREL) tablet 100 mg, 100 mg, Oral, QHS, Eugenie Filler, MD, 100 mg at 08/18/14 2124    REVIEW OF SYSTEMS:   - +ve wheeze, abd pain, nausea  - negative otherwise 11 point ROS  SUBJECTIVE:   VITAL SIGNS: Temp:  [97.6  F (36.4 C)-97.7 F (36.5 C)] 97.6 F (36.4 C) (02/26 0505) Pulse Rate:  [68-93] 93 (02/26 0505) Resp:  [18-20] 20 (02/26 0505) BP: (124-136)/(71-88) 128/71 mmHg (02/26 0505) SpO2:  [91 %-95 %] 94 % (02/26 0920) HEMODYNAMICS:   VENTILATOR SETTINGS:   INTAKE / OUTPUT:  Intake/Output Summary (Last 24 hours) at 08/19/14 0937 Last data filed at 08/19/14 0851  Gross per 24 hour  Intake 2506.25 ml  Output   1815 ml  Net 691.25 ml    PHYSICAL EXAMINATION: General:  Obese male with beard.Lying in bed. No distress Neuro:  AXOX3, Speech normal. Moves all 4s HEENT:  Supple neck . Mallampatti clas 3-4 Cardiovascular:  S1S+. No murmus Lungs:  bialteral exp wheezing + Abdomen:  Soft, Musculoskeletal:  No chyansosis,. No clubbing, No edema Skin:  intact  LABS:  PULMONARY No results for input(s): PHART, PCO2ART, PO2ART, HCO3, TCO2, O2SAT in the last 168 hours.  Invalid input(s): PCO2, PO2  CBC  Recent Labs Lab 08/17/14 0545 08/18/14 0510 08/19/14 0552  HGB 11.4* 11.5* 12.2*  HCT 34.9* 35.7* 37.7*  WBC 16.2* 15.2* 16.5*  PLT 316 PLATELET CLUMPS NOTED ON SMEAR, COUNT APPEARS ADEQUATE 368    COAGULATION No results for input(s): INR in the last 168 hours.  CARDIAC  No results for input(s): TROPONINI in the last 168 hours. No results for input(s): PROBNP in the last 168 hours.   CHEMISTRY  Recent Labs Lab 08/16/14 1040 08/17/14 0545 08/17/14 0856 08/18/14 0510 08/19/14 0552  NA 136 140  --  142 141  K 3.6 3.1*  --  4.1 3.8  CL 103 109  --  114* 109  CO2 19 19  --  21 22  GLUCOSE 127* 101*  --  171* 182*  BUN 29* 21  --  24* 33*  CREATININE 0.90 0.80  --  1.12 1.55*  CALCIUM 9.0 8.5  --  8.5 8.9  MG  --   --  1.6 1.8  --    Estimated Creatinine Clearance: 55.8 mL/min (by C-G formula based on Cr of 1.55).   LIVER  Recent Labs Lab 08/16/14 1040 08/17/14 0545 08/18/14 0510 08/19/14 0552  AST 25 22 17 23   ALT 26 22 19 20   ALKPHOS 177* 130* 116 102   BILITOT 2.5* 1.8* 1.4* 1.1  PROT 7.2 6.2 6.4 6.5  ALBUMIN 2.8* 2.4* 2.4* 2.4*     INFECTIOUS  Recent Labs Lab 08/16/14 1218  LATICACIDVEN 1.5     ENDOCRINE CBG (last 3)  No results for input(s): GLUCAP in the last 72 hours.       IMAGING x48h Ir Perc Cholecystostomy  08/17/2014   CLINICAL DATA:  Acute cholecystitis  EXAM: CHOLECYSTOSTOMY  FLUOROSCOPY TIME:  1 minutes and 6 seconds.  MEDICATIONS AND MEDICAL HISTORY: Versed 0 mg, Fentanyl 50 mcg.  Additional Medications: Zosyn 3.375 g  ANESTHESIA/SEDATION: Moderate sedation time: 14 minutes  CONTRAST:  5 cc Omnipaque 300  PROCEDURE: The procedure, risks, benefits, and alternatives were explained to the patient. Questions regarding the procedure were encouraged and answered. The patient understands and consents to the procedure.  The right upper quadrant was prepped with Betadine in a sterile fashion, and a sterile drape was applied covering the operative field. A sterile gown and sterile gloves were used for the procedure.  A 21 gauge needle was inserted into the gallbladder lumen under sonographic guidance and via transhepatic approach. It was removed over a 018 wire, which was upsized to a 3-J. A 10-French drain was advanced over the wire and coiled in the gallbladder lumen. It was sewn to the skin after being string fixed.  FINDINGS: The cystic duct is occluded.  COMPLICATIONS: None  IMPRESSION: Successful cholecystostomy. This needs to remain in place at least 6 weeks.   Electronically Signed   By: Marybelle Killings M.D.   On: 08/17/2014 16:19   Dg Chest Port 1 View  08/17/2014   CLINICAL DATA:  Dyspnea. Labored breathing sounds. 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 Borderline heart size and pulmonary vascularity are likely normal for technique. Diffuse interstitial pattern in the lungs possibly a chronic bronchitic changes. Linear  atelectasis in the right lung base. No blunting of costophrenic angles. No apparent pneumothorax. Degenerative changes in the spine. Similar appearance to previous study.  IMPRESSION: Shallow inspiration with atelectasis in the lung bases. Probable chronic bronchitic changes.   Electronically Signed   By: Lucienne Capers M.D.   On: 08/17/2014 23:28   Dg Chest Port 1 View  08/17/2014  CLINICAL DATA:  Hypertension, COPD.  Shortness of Breath.  EXAM: PORTABLE CHEST - 1 VIEW  COMPARISON:  08/16/2014  FINDINGS: Mild elevation of the right hemidiaphragm, stable. Right base atelectasis. Heart is borderline in size. Mild vascular congestion without overt edema. No effusions. No acute bony abnormality.  IMPRESSION: Borderline heart size, vascular congestion.  Right base atelectasis.   Electronically Signed   By: Rolm Baptise M.D.   On: 08/17/2014 14:24        ASSESSMENT / PLAN:  PULMONARY  A: #Baseline  - sarcoid/copd gold stage 2, recurrent exacerbator with hard to control exacerbations  - chronic LLL rounded atelctaais  - OSA/OHV  #Current  -  Acute on chronic resp failure with wheezing perMD assessment but subjectively baseline per patient  = ? AECOPD  -? Worsening atelectasis  ? HCAP  ? Acute diast chf  P:   CXR and CT chest wo contrast Check PCT, bnp, trop, and echo Continue steroids Continue nebs Continue zosyn restat baselibne cppa qhs Diureses start based on above results   RENAL A:   Acute Kidney Injury (any one) Increase in SCr by > 0.3 within 48 hours Increase SCr to > 1.5 times baseline Urine volume < 0.5 ml/kg/h for 6 hrs  Stage: Risk: 1.5x increase in creatinine or GFR decrease by 25% or UOP <0.33ml/kgperhr for 6 hrs Injury: 2x increase in creatinine or GFR decrease by 50% or UOP < 0.29ml/kgperhr for 12 hr Failure:3X increase in creatinine or GFR decrease by 75% or UOP < 0.50ml/kgperhr for 12  hr or  anuria 12 hrs Loss: complete loss of kidney function for more then 4 weeks End-stage renal disease:Complete loss of kidney function for more then 3 months   ? Due to dehydration   P:   Agree with fluids per [facility] Change morphine and oxycodone to fentanyl Reduce gabapentin dose Avoid all nephrotoxic drugs IF cardiac eval points to diast dysfn - then start diuresis     PREOP EVAL  He is definitely mod-high risk for pulmonary complications with ECRP but if needs to be done needs to be done. Will reasess risk  after Monday 08/22/14   PCCM will continue to follow over weekend  D/w Dr Halford Chessman. Dr Zella Richer and Dr Grandville Silos of [facility]  Family and patient updated   Dr. Brand Males, M.D., [facility].C.P Pulmonary and Critical Care Medicine Staff Physician [place] [facility] Pager: [phone], If no answer or between  15:00h - 7:00h: call [phone]  08/19/2014 10:36 AM

## 2014-08-20 DIAGNOSIS — J438 Other emphysema: Secondary | ICD-10-CM

## 2014-08-20 LAB — COMPREHENSIVE METABOLIC PANEL
ALBUMIN: 2.4 g/dL — AB (ref 3.5–5.2)
ALT: 20 U/L (ref 0–53)
AST: 19 U/L (ref 0–37)
Alkaline Phosphatase: 90 U/L (ref 39–117)
Anion gap: 7 (ref 5–15)
BILIRUBIN TOTAL: 0.9 mg/dL (ref 0.3–1.2)
BUN: 36 mg/dL — ABNORMAL HIGH (ref 6–23)
CALCIUM: 8.6 mg/dL (ref 8.4–10.5)
CO2: 23 mmol/L (ref 19–32)
Chloride: 111 mmol/L (ref 96–112)
Creatinine, Ser: 1.49 mg/dL — ABNORMAL HIGH (ref 0.50–1.35)
GFR calc Af Amer: 52 mL/min — ABNORMAL LOW (ref >90)
GFR calc non Af Amer: 44 mL/min — ABNORMAL LOW (ref >90)
Glucose, Bld: 163 mg/dL — ABNORMAL HIGH (ref 70–99)
POTASSIUM: 3.9 mmol/L (ref 3.5–5.1)
Sodium: 141 mmol/L (ref 135–145)
TOTAL PROTEIN: 5.9 g/dL — AB (ref 6.0–8.3)

## 2014-08-20 LAB — BODY FLUID CULTURE

## 2014-08-20 LAB — STOOL CULTURE

## 2014-08-20 LAB — CBC
HEMATOCRIT: 37.3 % — AB (ref 39.0–52.0)
HEMOGLOBIN: 11.8 g/dL — AB (ref 13.0–17.0)
MCH: 27.2 pg (ref 26.0–34.0)
MCHC: 31.6 g/dL (ref 30.0–36.0)
MCV: 85.9 fL (ref 78.0–100.0)
PLATELETS: 351 10*3/uL (ref 150–400)
RBC: 4.34 MIL/uL (ref 4.22–5.81)
RDW: 14.9 % (ref 11.5–15.5)
WBC: 9.3 10*3/uL (ref 4.0–10.5)

## 2014-08-20 LAB — TROPONIN I

## 2014-08-20 MED ORDER — METHYLPREDNISOLONE SODIUM SUCC 40 MG IJ SOLR
40.0000 mg | Freq: Three times a day (TID) | INTRAMUSCULAR | Status: DC
  Administered 2014-08-20 – 2014-08-22 (×6): 40 mg via INTRAVENOUS
  Filled 2014-08-20 (×8): qty 1

## 2014-08-20 NOTE — Progress Notes (Signed)
[facility] PROGRESS NOTE  ELBIE STATZER OEV:[id number] DOB: Feb 16, 1941 DOA: 08/16/2014 PCP: Jerlyn Ly, MD  Assessment/Plan: #1 sepsis Secondary to gangrenous cholecystitis noted on CT of the abdomen and pelvis. Patient met criteria for sepsis secondary to tachycardia, leukocytosis, increased respiratory rate. C. difficile PCR was negative. Blood cultures are pending. Patient is status post percutaneous cholecystostomy drain with fluid cultures pending. Continue empiric IV Zosyn. General surgery and GI following.  #2 gangrenous cholecystitis Due to patient's comorbidities and pulmonary status was recommended by general surgery that patient undergo percutaneous cholecystostomy which was done per interventional radiology. Body fluid cultures are pending. Patient has been pancultured. Continue empiric IV Zosyn. Follow.  #3 acute COPD exacerbation/sarcoidosis Patient with both expiratory and inspiratory wheezing on examination. Continue IV steroids, scheduled nebulizers, oxygen, pulmonary toilet. Per pulmonary.  #4 choledocholithiasis per CT of the abdomen and pelvis.  Patient will need an ERCP for common bile duct stone retrieval per GI once his pulmonary status has improved. Pulmonary managing respiratory status. GI following and appreciate input and recommendations.  #5 history of diastolic CHF Stable. Follow.  #6 hypertension Stable.  #7 hyperlipidemia Continue statin.  #8 acute renal failure Likely secondary to prerenal azotemia. Patient has been nothing by mouth since admission. FENa = 0.13%. Renal function trending down. CXR negative for volume overload. Follow.   #9 prophylaxis Heparin for DVT prophylaxis.   Code Status: Full Family Communication: Updated patient, wife at bedside. Disposition Plan: Remain inpatient.   Consultants:  Gen. surgery: Dr. Hunt Oris 08/16/2014  Gastroenterology: Dr. Fuller Plan 08/17/2014  Interventional radiology: Dr. Barbie Banner  08/17/2014  Pulmonary: Dr. Chase Caller 08/19/2014  Procedures:  CT abdomen and pelvis 08/16/2014  Chest x-ray 08/16/2014  Status post percutaneous cholecystostomy 08/17/2014 per Dr. Barbie Banner  Antibiotics:  IV Zosyn 08/17/2014  IV Flagyl 08/16/2014>>>>> 08/16/2014  IV ciprofloxacin 08/16/2014>>> 08/16/2014  IV Unasyn 08/16/2014>>>> 08/17/2014  HPI/Subjective: Patient states feeling better today. Patient states pain is better controlled. Patient feels wheezing is improving and at his baseline..   Objective: Filed Vitals:   08/20/14 0522  BP: 139/86  Pulse: 99  Temp: 97.9 F (36.6 C)  Resp: 18    Intake/Output Summary (Last 24 hours) at 08/20/14 1200 Last data filed at 08/20/14 1050  Gross per 24 hour  Intake   1450 ml  Output   1270 ml  Net    180 ml   Filed Weights   08/16/14 1500  Weight: 119.296 kg (263 lb)    Exam:   General:  NAD  Cardiovascular: RRR  Respiratory: decreased Inspiratory and expiratory wheezing. No crackles.  Abdomen: Soft, nontender, nondistended, positive bowel sounds. Percutaneous drain with purulent brown output.  Musculoskeletal: No clubbing cyanosis or edema.  Data Reviewed: Basic Metabolic Panel:  Recent Labs Lab 08/16/14 1040 08/17/14 0545 08/17/14 0856 08/18/14 0510 08/19/14 0552 08/20/14 0445  NA 136 140  --  142 141 141  K 3.6 3.1*  --  4.1 3.8 3.9  CL 103 109  --  114* 109 111  CO2 19 19  --  _0 GLUCOSE 127* 101*  --  171* 182* 163*  BUN 29* 21  --  24* 33* 36*  CREATININE 0.90 0.80  --  1.12 1.55* 1.49*  CALCIUM 9.0 8.5  --  8.5 8.9 8.6  MG  --   --  1.6 1.8  --   --    Liver Function Tests:  Recent Labs Lab 08/16/14 1040 08/17/14 0545 08/18/14 0510 08/19/14 0552 08/20/14 0445  AST _0 ALT _1 ALKPHOS 177* 130* 116 102 90  BILITOT 2.5* 1.8* 1.4* 1.1 0.9  PROT 7.2 6.2 6.4 6.5 5.9*  ALBUMIN 2.8* 2.4* 2.4* 2.4* 2.4*   No results for input(s): LIPASE, AMYLASE in the last  168 hours. No results for input(s): AMMONIA in the last 168 hours. CBC:  Recent Labs Lab 08/16/14 1040 08/17/14 0545 08/18/14 0510 08/19/14 0552 08/20/14 0445  WBC 20.9* 16.2* 15.2* 16.5* 9.3  NEUTROABS 17.7*  --  14.2* 15.3*  --   HGB 12.5* 11.4* 11.5* 12.2* 11.8*  HCT 37.5* 34.9* 35.7* 37.7* 37.3*  MCV 82.2 83.5 85.2 85.1 85.9  PLT 385 316 PLATELET CLUMPS NOTED ON SMEAR, COUNT APPEARS ADEQUATE 368 351   Cardiac Enzymes:  Recent Labs Lab 08/19/14 1040 08/20/14 0445  TROPONINI <0.03 <0.03   BNP (last 3 results)  Recent Labs  08/19/14 1040  BNP 460.9*    ProBNP (last 3 results)  Recent Labs  03/17/14 1305  PROBNP 235.0*    CBG: No results for input(s): GLUCAP in the last 168 hours.  Recent Results (from the past 240 hour(s))  Blood culture (routine x 2)     Status: None (Preliminary result)   Collection Time: 08/16/14 12:27 PM  Result Value Ref Range Status   Specimen Description BLOOD RIGHT HAND  Final   Special Requests   Final    BOTTLES DRAWN AEROBIC AND ANAEROBIC 5CC BOTH BOTTLES   Culture   Final           BLOOD CULTURE RECEIVED NO GROWTH TO DATE CULTURE WILL BE HELD FOR 5 DAYS BEFORE ISSUING A FINAL NEGATIVE REPORT Performed at [facility]    Report Status PENDING  Incomplete  Blood culture (routine x 2)     Status: None (Preliminary result)   Collection Time: 08/16/14 12:27 PM  Result Value Ref Range Status   Specimen Description BLOOD RIGHT ARM  Final   Special Requests   Final    BOTTLES DRAWN AEROBIC AND ANAEROBIC 4CC BOTH BOTTLES   Culture   Final           BLOOD CULTURE RECEIVED NO GROWTH TO DATE CULTURE WILL BE HELD FOR 5 DAYS BEFORE ISSUING A FINAL NEGATIVE REPORT Performed at [facility]    Report Status PENDING  Incomplete  Clostridium Difficile by PCR     Status: None   Collection Time: 08/16/14  3:11 PM  Result Value Ref Range Status   C difficile by pcr NEGATIVE NEGATIVE Final    Comment: Performed at [facility]  Stool culture     Status: None   Collection Time: 08/16/14  3:11 PM  Result Value Ref Range Status   Specimen Description STOOL  Final   Special Requests NONE  Final   Culture   Final    NO SALMONELLA, SHIGELLA, CAMPYLOBACTER, YERSINIA, OR E.COLI 0157:H7 ISOLATED Performed at [facility]    Report Status 08/20/2014 FINAL  Final  Body fluid culture     Status: None   Collection Time: 08/17/14  2:18 PM  Result Value Ref Range Status   Specimen Description GALL BLADDER  Final   Special Requests NONE  Final   Gram Stain   Final    ABUNDANT WBC PRESENT,BOTH PMN AND MONONUCLEAR NO SQUAMOUS EPITHELIAL CELLS SEEN FEW GRAM POSITIVE COCCI IN PAIRS FEW GRAM NEGATIVE RODS Performed at [facility]  Final    MULTIPLE ORGANISMS PRESENT, NONE PREDOMINANT Note: NO GROUP A STREP (S.PYOGENES) ISOLATED NO STAPHYLOCOCCUS AUREUS ISOLATED Performed at [facility]    Report Status 08/20/2014 FINAL  Final     Studies: Ct Chest Wo Contrast  08/19/2014   CLINICAL DATA:  Progressive cough or shortness of breath since admission hospital 1 week ago. Sarcoidosis. COPD. Healthcare associated pneumonia. Gastroesophageal reflux disease.  EXAM: CT CHEST WITHOUT CONTRAST  TECHNIQUE: Multidetector CT imaging of the chest was performed following the standard protocol without IV contrast.  COMPARISON:  Plain film 08/19/2014.  CT 03/16/2014.  FINDINGS: Mediastinum/Nodes: Aortic and branch vessel atherosclerosis. Mild cardiomegaly with multivessel coronary artery atherosclerosis.  Pulmonary arteries are enlarged, with the outflow tract measuring 3.4 cm. Similar appearance of enlarged mediastinal and hilar nodes, calcified and most consistent with sarcoidosis.  Lungs/Pleura: Small bilateral pleural effusions. The left pleural effusion is similar. The right pleural effusion is new since the prior of 03/16/2014.  Presumed secretions within the non dependent trachea on image  13, new. Lower lobe predominant bronchial wall thickening is moderate to marked. Mild centrilobular emphysema.  Bilateral peribronchovascular interstitial thickening and nodularity. Appear slightly progressive, including within the right upper lobe on image 24 and laterally within the left lower lobe on image 32. Scattered small pulmonary nodules which are felt to be similar in distribution.  No superimposed airspace consolidation.  Upper abdomen: Normal imaged portions of the liver, spleen, stomach, pancreas, adrenal glands, right kidney. Cholecystostomy with stones and/or contrast identified within the gallbladder. Pericholecystic edema has improved since 08/16/2014.  Musculoskeletal: No acute osseous abnormality. Moderate thoracic spondylosis.  IMPRESSION: 1. Pulmonary parenchymal findings which are most consistent with sarcoidosis. This may be slightly progressive, or accentuated by diminished lung volumes on the current exam. 2. No evidence of superimposed pneumonia. 3. Small bilateral pleural effusions. 4. Calcified thoracic adenopathy, also consistent with sarcoidosis. This is unchanged. 5.  Atherosclerosis, including within the coronary arteries. 6. Pulmonary artery enlargement suggests pulmonary arterial hypertension. 7. Cholecystostomy tube in place with improved pericholecystic inflammation since 08/16/2014.   Electronically Signed   By: Abigail Miyamoto M.D.   On: 08/19/2014 16:50   Dg Chest Port 1 View  08/19/2014   CLINICAL DATA:  Wheezing  EXAM: PORTABLE CHEST - 1 VIEW  COMPARISON:  August 17, 2014  FINDINGS: There is patchy atelectasis in each lung base, slightly more on the right than on the left, stable. There is no frank edema or consolidation. Heart is upper normal in size with pulmonary vascularity within normal limits. No adenopathy. No bone lesions.  IMPRESSION: Bibasilar atelectasis. No frank edema or consolidation. No appreciable change compared to recent prior study.   Electronically  Signed   By: Lowella Grip III M.D.   On: 08/19/2014 10:11    Scheduled Meds: . finasteride  5 mg Oral QPM  . FLUoxetine  40 mg Oral Q breakfast  . heparin  5,000 Units Subcutaneous 3 times per day  . ipratropium  0.5 mg Nebulization Q4H  . levalbuterol  0.63 mg Nebulization Q4H  . methylPREDNISolone (SOLU-MEDROL) injection  125 mg Intravenous 3 times per day  . metoprolol tartrate  25 mg Oral Q breakfast  . piperacillin-tazobactam (ZOSYN)  IV  3.375 g Intravenous 3 times per day  . simvastatin  20 mg Oral QPM  . traMADol  100 mg Oral 3 times per day  . traZODone  100 mg Oral QHS   Continuous Infusions: . sodium chloride 100 mL/hr at 08/20/14  0545    Principal Problem:   Sepsis Active Problems:   Gangrenous cholecystitis   Cholecystitis, acute with cholelithiasis   COPD exacerbation   COPD with chronic bronchitis   Essential hypertension   Hyperlipidemia   Diastolic CHF   Hypokalemia   Calculus of gallbladder with acute cholecystitis and obstruction   Choledocholithiasis   Acute on chronic respiratory failure   OSA (obstructive sleep apnea)   Preop respiratory exam   Acute kidney injury   Sarcoid   Wheezing    Time spent: 93 minutes    Jaelyn Bourgoin M.D. [facility] Pager [phone]. If 7PM-7AM, please contact night-coverage at [email or web address], password [facility] 08/20/2014, 12:00 PM  LOS: 4 days

## 2014-08-20 NOTE — Progress Notes (Signed)
Patient ID: Travis Ferguson, male   DOB: Dec 16, 1940, 74 y.o.   MRN: [id number]    Subjective: Abdominal pain is much better. Occasional mild shortness of breath. No other complaints.  Objective: Vital signs in last 24 hours: Temp:  [97.5 F (36.4 C)-97.9 F (36.6 C)] 97.9 F (36.6 C) (02/27 0522) Pulse Rate:  [98-102] 99 (02/27 0522) Resp:  [18-21] 18 (02/27 0522) BP: (139-153)/(86-101) 139/86 mmHg (02/27 0522) SpO2:  [91 %-100 %] 91 % (02/27 0915) Last BM Date: 08/17/14  Intake/Output from previous day: 02/26 0701 - 02/27 0700 In: 2063.7 [P.O.:360; I.V.:1653.7; IV Piggyback:50] Out: 1640 [Urine:1625; Drains:15] Intake/Output this shift:    General appearance: alert, cooperative and no distress Resp: minimal wheezing. No significant increased work of breathing. GI: normal findings: soft, non-tender  Lab Results:   Recent Labs  08/19/14 0552 08/20/14 0445  WBC 16.5* 9.3  HGB 12.2* 11.8*  HCT 37.7* 37.3*  PLT 368 351   BMET  Recent Labs  08/19/14 0552 08/20/14 0445  NA 141 141  K 3.8 3.9  CL 109 111  CO2 22 23  GLUCOSE 182* 163*  BUN 33* 36*  CREATININE 1.55* 1.49*  CALCIUM 8.9 8.6   Hepatic Function Latest Ref Rng 08/20/2014 08/19/2014 08/18/2014  Total Protein 6.0 - 8.3 g/dL 5.9(L) 6.5 6.4  Albumin 3.5 - 5.2 g/dL 2.4(L) 2.4(L) 2.4(L)  AST 0 - 37 U/L 19 23 17   ALT 0 - 53 U/L 20 20 19   Alk Phosphatase 39 - 117 U/L 90 102 116  Total Bilirubin 0.3 - 1.2 mg/dL 0.9 1.1 1.4(H)     Studies/Results: Ct Chest Wo Contrast  08/19/2014   CLINICAL DATA:  Progressive cough or shortness of breath since admission hospital 1 week ago. Sarcoidosis. COPD. Healthcare associated pneumonia. Gastroesophageal reflux disease.  EXAM: CT CHEST WITHOUT CONTRAST  TECHNIQUE: Multidetector CT imaging of the chest was performed following the standard protocol without IV contrast.  COMPARISON:  Plain film 08/19/2014.  CT 03/16/2014.  FINDINGS: Mediastinum/Nodes: Aortic and branch vessel  atherosclerosis. Mild cardiomegaly with multivessel coronary artery atherosclerosis.  Pulmonary arteries are enlarged, with the outflow tract measuring 3.4 cm. Similar appearance of enlarged mediastinal and hilar nodes, calcified and most consistent with sarcoidosis.  Lungs/Pleura: Small bilateral pleural effusions. The left pleural effusion is similar. The right pleural effusion is new since the prior of 03/16/2014.  Presumed secretions within the non dependent trachea on image 13, new. Lower lobe predominant bronchial wall thickening is moderate to marked. Mild centrilobular emphysema.  Bilateral peribronchovascular interstitial thickening and nodularity. Appear slightly progressive, including within the right upper lobe on image 24 and laterally within the left lower lobe on image 32. Scattered small pulmonary nodules which are felt to be similar in distribution.  No superimposed airspace consolidation.  Upper abdomen: Normal imaged portions of the liver, spleen, stomach, pancreas, adrenal glands, right kidney. Cholecystostomy with stones and/or contrast identified within the gallbladder. Pericholecystic edema has improved since 08/16/2014.  Musculoskeletal: No acute osseous abnormality. Moderate thoracic spondylosis.  IMPRESSION: 1. Pulmonary parenchymal findings which are most consistent with sarcoidosis. This may be slightly progressive, or accentuated by diminished lung volumes on the current exam. 2. No evidence of superimposed pneumonia. 3. Small bilateral pleural effusions. 4. Calcified thoracic adenopathy, also consistent with sarcoidosis. This is unchanged. 5.  Atherosclerosis, including within the coronary arteries. 6. Pulmonary artery enlargement suggests pulmonary arterial hypertension. 7. Cholecystostomy tube in place with improved pericholecystic inflammation since 08/16/2014.   Electronically Signed   By:  Abigail Miyamoto M.D.   On: 08/19/2014 16:50   Dg Chest Port 1 View  08/19/2014   CLINICAL  DATA:  Wheezing  EXAM: PORTABLE CHEST - 1 VIEW  COMPARISON:  August 17, 2014  FINDINGS: There is patchy atelectasis in each lung base, slightly more on the right than on the left, stable. There is no frank edema or consolidation. Heart is upper normal in size with pulmonary vascularity within normal limits. No adenopathy. No bone lesions.  IMPRESSION: Bibasilar atelectasis. No frank edema or consolidation. No appreciable change compared to recent prior study.   Electronically Signed   By: Lowella Grip III M.D.   On: 08/19/2014 10:11    Anti-infectives: Anti-infectives    Start     Dose/Rate Route Frequency Ordered Stop   08/17/14 1200  piperacillin-tazobactam (ZOSYN) IVPB 3.375 g     3.375 g 12.5 mL/hr over 240 Minutes Intravenous 3 times per day 08/17/14 0811     08/16/14 1900  Ampicillin-Sulbactam (UNASYN) 3 g in sodium chloride 0.9 % 100 mL IVPB  Status:  Discontinued     3 g 100 mL/hr over 60 Minutes Intravenous Every 6 hours 08/16/14 1804 08/17/14 0811   08/16/14 1500  metroNIDAZOLE (FLAGYL) IVPB 500 mg  Status:  Discontinued     500 mg 100 mL/hr over 60 Minutes Intravenous 3 times per day 08/16/14 1348 08/16/14 1735   08/16/14 1400  ciprofloxacin (CIPRO) IVPB 400 mg  Status:  Discontinued     400 mg 200 mL/hr over 60 Minutes Intravenous Every 12 hours 08/16/14 1346 08/16/14 1735   08/16/14 1345  metroNIDAZOLE (FLAGYL) IVPB 500 mg  Status:  Discontinued     500 mg 100 mL/hr over 60 Minutes Intravenous Every 8 hours 08/16/14 1331 08/16/14 1348      Assessment/Plan: 1. Acute calculous cholecystitis with surround phlegmon s/p cholecystostomy (Dr. Barbie Banner) 08/17/14, - on IV abxs; Cipro/Flagyl 2/23, Zosyn started 08/17/14. Improving, white count normalized 2.  Choledocholithiasis on CT-LFTs normalizing; ERCP planned when pulmonary status more stable. 3. COPD with chronic bronchitis. Pulmonary following. 4. Essential hypertension 5. Hx of Diastolic CHF 6. DVT prophylaxis:  Heparin/SCD 7. Hyperlipidemia 8. Body mass index is 35.66 kg/(m^2).     LOS: 4 days    Ghali Morissette T 08/20/2014

## 2014-08-20 NOTE — Progress Notes (Signed)
Subjective: Pt doing well today with no increased wob.  Sitting up in chair  Objective: Vital signs in last 24 hours: Blood pressure 139/86, pulse 99, temperature 97.9 F (36.6 C), temperature source Oral, resp. rate 18, height 6' (1.829 m), weight 119.296 kg (263 lb), SpO2 96 %.  Intake/Output from previous day: 02/26 0701 - 02/27 0700 In: 2063.7 [P.O.:360; I.V.:1653.7; IV Piggyback:50] Out: 1640 [Urine:1625; Drains:15]   Physical Exam:   Obese male in nad Nose without purulence or d/c noted. Neck without LN or TMG Chest with clear BS, no wheezing. Cor with rrr LE with mild edema, no cyanosis Alert and oriented, moves all 4.      Assessment/Plan:  1) COPD The pt is very stable from this standpoint, with no wheezing on exam or increased wob.  -continue nebulized BD -taper steroids way back with on bronchospasm on exam  2) Sarcoidosis No evidence for ongoing flare.  CT chest very little difference from baseline.   3) OSA Continue on cpap qhs.  Will see again on monday    Kathee Delton, M.D. 08/20/2014, 2:46 PM

## 2014-08-20 NOTE — Progress Notes (Signed)
Patient does not want to wear CPAP tonight.  Patient aware if he changes his mind to have RN call RT.

## 2014-08-21 LAB — COMPREHENSIVE METABOLIC PANEL
ALT: 20 U/L (ref 0–53)
AST: 19 U/L (ref 0–37)
Albumin: 2.5 g/dL — ABNORMAL LOW (ref 3.5–5.2)
Alkaline Phosphatase: 77 U/L (ref 39–117)
Anion gap: 6 (ref 5–15)
BUN: 36 mg/dL — ABNORMAL HIGH (ref 6–23)
CO2: 23 mmol/L (ref 19–32)
Calcium: 8.3 mg/dL — ABNORMAL LOW (ref 8.4–10.5)
Chloride: 108 mmol/L (ref 96–112)
Creatinine, Ser: 1.25 mg/dL (ref 0.50–1.35)
GFR calc Af Amer: 64 mL/min — ABNORMAL LOW (ref >90)
GFR calc non Af Amer: 55 mL/min — ABNORMAL LOW (ref >90)
GLUCOSE: 141 mg/dL — AB (ref 70–99)
Potassium: 3.9 mmol/L (ref 3.5–5.1)
Sodium: 137 mmol/L (ref 135–145)
Total Bilirubin: 1 mg/dL (ref 0.3–1.2)
Total Protein: 5.6 g/dL — ABNORMAL LOW (ref 6.0–8.3)

## 2014-08-21 LAB — URINE CULTURE
CULTURE: NO GROWTH
Colony Count: NO GROWTH

## 2014-08-21 LAB — CBC WITH DIFFERENTIAL/PLATELET
Basophils Absolute: 0 10*3/uL (ref 0.0–0.1)
Basophils Relative: 0 % (ref 0–1)
EOS PCT: 0 % (ref 0–5)
Eosinophils Absolute: 0 10*3/uL (ref 0.0–0.7)
HCT: 37.1 % — ABNORMAL LOW (ref 39.0–52.0)
Hemoglobin: 11.6 g/dL — ABNORMAL LOW (ref 13.0–17.0)
LYMPHS ABS: 0.7 10*3/uL (ref 0.7–4.0)
Lymphocytes Relative: 13 % (ref 12–46)
MCH: 27.1 pg (ref 26.0–34.0)
MCHC: 31.3 g/dL (ref 30.0–36.0)
MCV: 86.7 fL (ref 78.0–100.0)
MONO ABS: 0.1 10*3/uL (ref 0.1–1.0)
Monocytes Relative: 1 % — ABNORMAL LOW (ref 3–12)
NEUTROS ABS: 5.1 10*3/uL (ref 1.7–7.7)
Neutrophils Relative %: 86 % — ABNORMAL HIGH (ref 43–77)
Platelets: 298 10*3/uL (ref 150–400)
RBC: 4.28 MIL/uL (ref 4.22–5.81)
RDW: 14.8 % (ref 11.5–15.5)
WBC: 5.9 10*3/uL (ref 4.0–10.5)

## 2014-08-21 LAB — PROCALCITONIN: Procalcitonin: 0.1 ng/mL

## 2014-08-21 NOTE — Progress Notes (Signed)
CPAP set up and patient placed on auto titrate setting (min 5.0 max 20.0) via nasal mask with 3 lpm O2 bleed in.  Patient stable and tolerating well at this time.  RN aware.

## 2014-08-21 NOTE — Progress Notes (Signed)
[facility] PROGRESS NOTE  Travis Ferguson HYI:[id number] DOB: 05-21-1941 DOA: 08/16/2014 PCP: Jerlyn Ly, MD  Assessment/Plan: #1 sepsis Secondary to gangrenous cholecystitis noted on CT of the abdomen and pelvis. Patient met criteria for sepsis secondary to tachycardia, leukocytosis, increased respiratory rate. C. difficile PCR was negative. Blood cultures are pending. Patient is status post percutaneous cholecystostomy drain with fluid cultures pending. Continue empiric IV Zosyn. General surgery and GI following.  #2 gangrenous cholecystitis Due to patient's comorbidities and pulmonary status was recommended by general surgery that patient undergo percutaneous cholecystostomy which was done per interventional radiology. Body fluid cultures are pending. Patient has been pancultured. Continue empiric IV Zosyn. Follow.  #3 acute COPD exacerbation/sarcoidosis Patient with both expiratory and inspiratory wheezing on examination. Continue steroids, scheduled nebulizers, oxygen, pulmonary toilet. Per pulmonary.  #4 choledocholithiasis per CT of the abdomen and pelvis.  Patient will need an ERCP for common bile duct stone retrieval per GI once his pulmonary status has improved. Pulmonary managing respiratory status. GI following and appreciate input and recommendations. Will keep patient nothing by mouth after midnight in anticipation of possible ERCP tomorrow.  #5 history of diastolic CHF Stable. Follow.  #6 hypertension Stable.  #7 hyperlipidemia Continue statin.  #8 acute renal failure Likely secondary to prerenal azotemia. Renal function slowly improving. Patient on clears. FENa = 0.13%. Renal function trending down. CXR negative for volume overload. Follow.   #9 prophylaxis Heparin for DVT prophylaxis.   Code Status: Full Family Communication: Updated patient, wife at bedside. Disposition Plan: Remain inpatient.   Consultants:  Gen. surgery: Dr. Hunt Oris  08/16/2014  Gastroenterology: Dr. Fuller Plan 08/17/2014  Interventional radiology: Dr. Barbie Banner 08/17/2014  Pulmonary: Dr. Chase Caller 08/19/2014  Procedures:  CT abdomen and pelvis 08/16/2014  Chest x-ray 08/16/2014  Status post percutaneous cholecystostomy 08/17/2014 per Dr. Barbie Banner  Antibiotics:  IV Zosyn 08/17/2014  IV Flagyl 08/16/2014>>>>> 08/16/2014  IV ciprofloxacin 08/16/2014>>> 08/16/2014  IV Unasyn 08/16/2014>>>> 08/17/2014  HPI/Subjective: Patient states feeling better today. Patient states pain is better controlled. Patient feels wheezing is improving and at his baseline..   Objective: Filed Vitals:   08/21/14 0540  BP: 125/71  Pulse: 89  Temp: 98 F (36.7 C)  Resp: 18    Intake/Output Summary (Last 24 hours) at 08/21/14 1314 Last data filed at 08/21/14 0915  Gross per 24 hour  Intake 1130.42 ml  Output   1350 ml  Net -219.58 ml   Filed Weights   08/16/14 1500  Weight: 119.296 kg (263 lb)    Exam:   General:  NAD  Cardiovascular: RRR  Respiratory: decreased Inspiratory and expiratory wheezing. No crackles.  Abdomen: Soft, nontender, nondistended, positive bowel sounds. Percutaneous drain with purulent brown output.  Musculoskeletal: No clubbing cyanosis or edema.  Data Reviewed: Basic Metabolic Panel:  Recent Labs Lab 08/17/14 0545 08/17/14 0856 08/18/14 0510 08/19/14 0552 08/20/14 0445 08/21/14 0531  NA 140  --  142 141 141 137  K 3.1*  --  4.1 3.8 3.9 3.9  CL 109  --  114* 109 111 108  CO2 19  --  _0 GLUCOSE 101*  --  171* 182* 163* 141*  BUN 21  --  24* 33* 36* 36*  CREATININE 0.80  --  1.12 1.55* 1.49* 1.25  CALCIUM 8.5  --  8.5 8.9 8.6 8.3*  MG  --  1.6 1.8  --   --   --    Liver Function Tests:  Recent Labs Lab 08/17/14 0545 08/18/14  0510 08/19/14 0552 08/20/14 0445 08/21/14 0531  AST _0 ALT _1 ALKPHOS 130* 116 102 90 77  BILITOT 1.8* 1.4* 1.1 0.9 1.0  PROT 6.2 6.4 6.5 5.9* 5.6*   ALBUMIN 2.4* 2.4* 2.4* 2.4* 2.5*   No results for input(s): LIPASE, AMYLASE in the last 168 hours. No results for input(s): AMMONIA in the last 168 hours. CBC:  Recent Labs Lab 08/16/14 1040 08/17/14 0545 08/18/14 0510 08/19/14 0552 08/20/14 0445 08/21/14 0531  WBC 20.9* 16.2* 15.2* 16.5* 9.3 5.9  NEUTROABS 17.7*  --  14.2* 15.3*  --  5.1  HGB 12.5* 11.4* 11.5* 12.2* 11.8* 11.6*  HCT 37.5* 34.9* 35.7* 37.7* 37.3* 37.1*  MCV 82.2 83.5 85.2 85.1 85.9 86.7  PLT 385 316 PLATELET CLUMPS NOTED ON SMEAR, COUNT APPEARS ADEQUATE 368 351 298   Cardiac Enzymes:  Recent Labs Lab 08/19/14 1040 08/20/14 0445  TROPONINI <0.03 <0.03   BNP (last 3 results)  Recent Labs  08/19/14 1040  BNP 460.9*    ProBNP (last 3 results)  Recent Labs  03/17/14 1305  PROBNP 235.0*    CBG: No results for input(s): GLUCAP in the last 168 hours.  Recent Results (from the past 240 hour(s))  Blood culture (routine x 2)     Status: None (Preliminary result)   Collection Time: 08/16/14 12:27 PM  Result Value Ref Range Status   Specimen Description BLOOD RIGHT HAND  Final   Special Requests   Final    BOTTLES DRAWN AEROBIC AND ANAEROBIC 5CC BOTH BOTTLES   Culture   Final           BLOOD CULTURE RECEIVED NO GROWTH TO DATE CULTURE WILL BE HELD FOR 5 DAYS BEFORE ISSUING A FINAL NEGATIVE REPORT Performed at [facility]    Report Status PENDING  Incomplete  Blood culture (routine x 2)     Status: None (Preliminary result)   Collection Time: 08/16/14 12:27 PM  Result Value Ref Range Status   Specimen Description BLOOD RIGHT ARM  Final   Special Requests   Final    BOTTLES DRAWN AEROBIC AND ANAEROBIC 4CC BOTH BOTTLES   Culture   Final           BLOOD CULTURE RECEIVED NO GROWTH TO DATE CULTURE WILL BE HELD FOR 5 DAYS BEFORE ISSUING A FINAL NEGATIVE REPORT Performed at [facility]    Report Status PENDING  Incomplete  Clostridium Difficile by PCR     Status: None   Collection  Time: 08/16/14  3:11 PM  Result Value Ref Range Status   C difficile by pcr NEGATIVE NEGATIVE Final    Comment: Performed at [facility]  Stool culture     Status: None   Collection Time: 08/16/14  3:11 PM  Result Value Ref Range Status   Specimen Description STOOL  Final   Special Requests NONE  Final   Culture   Final    NO SALMONELLA, SHIGELLA, CAMPYLOBACTER, YERSINIA, OR E.COLI 0157:H7 ISOLATED Performed at [facility]    Report Status 08/20/2014 FINAL  Final  Body fluid culture     Status: None   Collection Time: 08/17/14  2:18 PM  Result Value Ref Range Status   Specimen Description GALL BLADDER  Final   Special Requests NONE  Final   Gram Stain   Final    ABUNDANT WBC PRESENT,BOTH PMN AND MONONUCLEAR NO SQUAMOUS EPITHELIAL CELLS SEEN FEW GRAM POSITIVE  COCCI IN PAIRS FEW GRAM NEGATIVE RODS Performed at [facility]    Culture   Final    MULTIPLE ORGANISMS PRESENT, NONE PREDOMINANT Note: NO GROUP A STREP (S.PYOGENES) ISOLATED NO STAPHYLOCOCCUS AUREUS ISOLATED Performed at [facility]    Report Status 08/20/2014 FINAL  Final  Urine culture     Status: None   Collection Time: 08/19/14  7:09 PM  Result Value Ref Range Status   Specimen Description URINE, CLEAN CATCH  Final   Special Requests NONE  Final   Colony Count NO GROWTH Performed at [facility]   Final   Culture NO GROWTH Performed at [facility]   Final   Report Status 08/21/2014 FINAL  Final     Studies: Ct Chest Wo Contrast  08/19/2014   CLINICAL DATA:  Progressive cough or shortness of breath since admission hospital 1 week ago. Sarcoidosis. COPD. Healthcare associated pneumonia. Gastroesophageal reflux disease.  EXAM: CT CHEST WITHOUT CONTRAST  TECHNIQUE: Multidetector CT imaging of the chest was performed following the standard protocol without IV contrast.  COMPARISON:  Plain film 08/19/2014.  CT 03/16/2014.  FINDINGS: Mediastinum/Nodes: Aortic and  branch vessel atherosclerosis. Mild cardiomegaly with multivessel coronary artery atherosclerosis.  Pulmonary arteries are enlarged, with the outflow tract measuring 3.4 cm. Similar appearance of enlarged mediastinal and hilar nodes, calcified and most consistent with sarcoidosis.  Lungs/Pleura: Small bilateral pleural effusions. The left pleural effusion is similar. The right pleural effusion is new since the prior of 03/16/2014.  Presumed secretions within the non dependent trachea on image 13, new. Lower lobe predominant bronchial wall thickening is moderate to marked. Mild centrilobular emphysema.  Bilateral peribronchovascular interstitial thickening and nodularity. Appear slightly progressive, including within the right upper lobe on image 24 and laterally within the left lower lobe on image 32. Scattered small pulmonary nodules which are felt to be similar in distribution.  No superimposed airspace consolidation.  Upper abdomen: Normal imaged portions of the liver, spleen, stomach, pancreas, adrenal glands, right kidney. Cholecystostomy with stones and/or contrast identified within the gallbladder. Pericholecystic edema has improved since 08/16/2014.  Musculoskeletal: No acute osseous abnormality. Moderate thoracic spondylosis.  IMPRESSION: 1. Pulmonary parenchymal findings which are most consistent with sarcoidosis. This may be slightly progressive, or accentuated by diminished lung volumes on the current exam. 2. No evidence of superimposed pneumonia. 3. Small bilateral pleural effusions. 4. Calcified thoracic adenopathy, also consistent with sarcoidosis. This is unchanged. 5.  Atherosclerosis, including within the coronary arteries. 6. Pulmonary artery enlargement suggests pulmonary arterial hypertension. 7. Cholecystostomy tube in place with improved pericholecystic inflammation since 08/16/2014.   Electronically Signed   By: Abigail Miyamoto M.D.   On: 08/19/2014 16:50    Scheduled Meds: . finasteride  5  mg Oral QPM  . FLUoxetine  40 mg Oral Q breakfast  . heparin  5,000 Units Subcutaneous 3 times per day  . ipratropium  0.5 mg Nebulization Q4H  . levalbuterol  0.63 mg Nebulization Q4H  . methylPREDNISolone (SOLU-MEDROL) injection  40 mg Intravenous 3 times per day  . metoprolol tartrate  25 mg Oral Q breakfast  . piperacillin-tazobactam (ZOSYN)  IV  3.375 g Intravenous 3 times per day  . simvastatin  20 mg Oral QPM  . traMADol  100 mg Oral 3 times per day  . traZODone  100 mg Oral QHS   Continuous Infusions: . sodium chloride 75 mL/hr at 08/20/14 1201    Principal Problem:   Sepsis Active Problems:   Gangrenous  cholecystitis   Cholecystitis, acute with cholelithiasis   COPD exacerbation   COPD with chronic bronchitis   Essential hypertension   Hyperlipidemia   Diastolic CHF   Hypokalemia   Calculus of gallbladder with acute cholecystitis and obstruction   Choledocholithiasis   Acute on chronic respiratory failure   OSA (obstructive sleep apnea)   Preop respiratory exam   Acute kidney injury   Sarcoid   Wheezing   Other emphysema    Time spent: 60 minutes    THOMPSON,DANIEL M.D. [facility] Pager [phone]. If 7PM-7AM, please contact night-coverage at [email or web address], password [facility] 08/21/2014, 1:14 PM  LOS: 5 days

## 2014-08-21 NOTE — Progress Notes (Signed)
Patient ID: Travis Ferguson, male   DOB: [birth year], 74 y.o.   MRN: [id number] Patient ID: Travis Ferguson, male   DOB: Nov 27, 1940, 74 y.o.   MRN: [id number]    Subjective: Continues to feel better. No abd pain  Occasional cough but improving. No other complaints.  Objective: Vital signs in last 24 hours: Temp:  [97.5 F (36.4 C)-98 F (36.7 C)] 98 F (36.7 C) (02/28 0540) Pulse Rate:  [68-93] 89 (02/28 0540) Resp:  [18-20] 18 (02/28 0540) BP: (125-151)/(71-92) 125/71 mmHg (02/28 0540) SpO2:  [90 %-96 %] 90 % (02/28 0904) FiO2 (%):  [21 %] 21 % (02/28 0904) Last BM Date: 08/20/14  Intake/Output from previous day: 02/27 0701 - 02/28 0700 In: 2970.4 [P.O.:720; I.V.:2250.4] Out: 1380 [Urine:1300; Drains:80] Intake/Output this shift: Total I/O In: 240 [P.O.:240] Out: -   General appearance: alert, cooperative and no distress Resp: minimal wheezing. No significant increased work of breathing. GI: normal findings: soft, non-tender  Lab Results:   Recent Labs  08/20/14 0445 08/21/14 0531  WBC 9.3 5.9  HGB 11.8* 11.6*  HCT 37.3* 37.1*  PLT 351 298   BMET  Recent Labs  08/20/14 0445 08/21/14 0531  NA 141 137  K 3.9 3.9  CL 111 108  CO2 23 23  GLUCOSE 163* 141*  BUN 36* 36*  CREATININE 1.49* 1.25  CALCIUM 8.6 8.3*   Hepatic Function Latest Ref Rng 08/21/2014 08/20/2014 08/19/2014  Total Protein 6.0 - 8.3 g/dL 5.6(L) 5.9(L) 6.5  Albumin 3.5 - 5.2 g/dL 2.5(L) 2.4(L) 2.4(L)  AST 0 - 37 U/L 19 19 23   ALT 0 - 53 U/L 20 20 20   Alk Phosphatase 39 - 117 U/L 77 90 102  Total Bilirubin 0.3 - 1.2 mg/dL 1.0 0.9 1.1     Studies/Results: Ct Chest Wo Contrast  08/19/2014   CLINICAL DATA:  Progressive cough or shortness of breath since admission hospital 1 week ago. Sarcoidosis. COPD. Healthcare associated pneumonia. Gastroesophageal reflux disease.  EXAM: CT CHEST WITHOUT CONTRAST  TECHNIQUE: Multidetector CT imaging of the chest was performed following the standard protocol  without IV contrast.  COMPARISON:  Plain film 08/19/2014.  CT 03/16/2014.  FINDINGS: Mediastinum/Nodes: Aortic and branch vessel atherosclerosis. Mild cardiomegaly with multivessel coronary artery atherosclerosis.  Pulmonary arteries are enlarged, with the outflow tract measuring 3.4 cm. Similar appearance of enlarged mediastinal and hilar nodes, calcified and most consistent with sarcoidosis.  Lungs/Pleura: Small bilateral pleural effusions. The left pleural effusion is similar. The right pleural effusion is new since the prior of 03/16/2014.  Presumed secretions within the non dependent trachea on image 13, new. Lower lobe predominant bronchial wall thickening is moderate to marked. Mild centrilobular emphysema.  Bilateral peribronchovascular interstitial thickening and nodularity. Appear slightly progressive, including within the right upper lobe on image 24 and laterally within the left lower lobe on image 32. Scattered small pulmonary nodules which are felt to be similar in distribution.  No superimposed airspace consolidation.  Upper abdomen: Normal imaged portions of the liver, spleen, stomach, pancreas, adrenal glands, right kidney. Cholecystostomy with stones and/or contrast identified within the gallbladder. Pericholecystic edema has improved since 08/16/2014.  Musculoskeletal: No acute osseous abnormality. Moderate thoracic spondylosis.  IMPRESSION: 1. Pulmonary parenchymal findings which are most consistent with sarcoidosis. This may be slightly progressive, or accentuated by diminished lung volumes on the current exam. 2. No evidence of superimposed pneumonia. 3. Small bilateral pleural effusions. 4. Calcified thoracic adenopathy, also consistent with sarcoidosis. This is unchanged. 5.  Atherosclerosis,  including within the coronary arteries. 6. Pulmonary artery enlargement suggests pulmonary arterial hypertension. 7. Cholecystostomy tube in place with improved pericholecystic inflammation since  08/16/2014.   Electronically Signed   By: Abigail Miyamoto M.D.   On: 08/19/2014 16:50    Anti-infectives: Anti-infectives    Start     Dose/Rate Route Frequency Ordered Stop   08/17/14 1200  piperacillin-tazobactam (ZOSYN) IVPB 3.375 g     3.375 g 12.5 mL/hr over 240 Minutes Intravenous 3 times per day 08/17/14 0811     08/16/14 1900  Ampicillin-Sulbactam (UNASYN) 3 g in sodium chloride 0.9 % 100 mL IVPB  Status:  Discontinued     3 g 100 mL/hr over 60 Minutes Intravenous Every 6 hours 08/16/14 1804 08/17/14 0811   08/16/14 1500  metroNIDAZOLE (FLAGYL) IVPB 500 mg  Status:  Discontinued     500 mg 100 mL/hr over 60 Minutes Intravenous 3 times per day 08/16/14 1348 08/16/14 1735   08/16/14 1400  ciprofloxacin (CIPRO) IVPB 400 mg  Status:  Discontinued     400 mg 200 mL/hr over 60 Minutes Intravenous Every 12 hours 08/16/14 1346 08/16/14 1735   08/16/14 1345  metroNIDAZOLE (FLAGYL) IVPB 500 mg  Status:  Discontinued     500 mg 100 mL/hr over 60 Minutes Intravenous Every 8 hours 08/16/14 1331 08/16/14 1348      Assessment/Plan: 1. Acute calculous cholecystitis with surround phlegmon s/p cholecystostomy (Dr. Barbie Banner) 08/17/14, - on IV abxs; Cipro/Flagyl 2/23, Zosyn started 08/17/14. Improving, white count normalized 2.  Choledocholithiasis on CT-LFTs normalizing; ERCP planned when pulmonary status more stable. 3. COPD with chronic bronchitis. Pulmonary following. 4. Essential hypertension 5. Hx of Diastolic CHF 6. DVT prophylaxis: Heparin/SCD 7. Hyperlipidemia 8. Body mass index is 35.66 kg/(m^2).     LOS: 5 days    Jaimes Eckert T 08/21/2014

## 2014-08-21 NOTE — Progress Notes (Signed)
Patient unable to tolerate CPAP at this time.  RT tried auto (min 5.0, max 20.0) (10.0 cm H20) and auto (min 5.0 max 16.0).  RT offered FFM, patient agreed but changed his mind prior to placing it on.  Patient asked to be taken off CPAP.  States he will discuss with the MD tomorrow.

## 2014-08-22 ENCOUNTER — Inpatient Hospital Stay (HOSPITAL_COMMUNITY): Payer: Medicare Other

## 2014-08-22 LAB — CULTURE, BLOOD (ROUTINE X 2)
Culture: NO GROWTH
Culture: NO GROWTH

## 2014-08-22 LAB — COMPREHENSIVE METABOLIC PANEL
ALBUMIN: 2.4 g/dL — AB (ref 3.5–5.2)
ALK PHOS: 73 U/L (ref 39–117)
ALT: 23 U/L (ref 0–53)
AST: 23 U/L (ref 0–37)
Anion gap: 6 (ref 5–15)
BUN: 39 mg/dL — ABNORMAL HIGH (ref 6–23)
CO2: 23 mmol/L (ref 19–32)
Calcium: 8.2 mg/dL — ABNORMAL LOW (ref 8.4–10.5)
Chloride: 112 mmol/L (ref 96–112)
Creatinine, Ser: 1.43 mg/dL — ABNORMAL HIGH (ref 0.50–1.35)
GFR calc Af Amer: 54 mL/min — ABNORMAL LOW (ref >90)
GFR calc non Af Amer: 47 mL/min — ABNORMAL LOW (ref >90)
Glucose, Bld: 118 mg/dL — ABNORMAL HIGH (ref 70–99)
Potassium: 4.4 mmol/L (ref 3.5–5.1)
Sodium: 141 mmol/L (ref 135–145)
Total Bilirubin: 1 mg/dL (ref 0.3–1.2)
Total Protein: 5.3 g/dL — ABNORMAL LOW (ref 6.0–8.3)

## 2014-08-22 LAB — CBC WITH DIFFERENTIAL/PLATELET
Basophils Absolute: 0 10*3/uL (ref 0.0–0.1)
Basophils Relative: 0 % (ref 0–1)
EOS PCT: 0 % (ref 0–5)
Eosinophils Absolute: 0 10*3/uL (ref 0.0–0.7)
HCT: 37.3 % — ABNORMAL LOW (ref 39.0–52.0)
HEMOGLOBIN: 11.9 g/dL — AB (ref 13.0–17.0)
LYMPHS ABS: 1 10*3/uL (ref 0.7–4.0)
LYMPHS PCT: 12 % (ref 12–46)
MCH: 27.6 pg (ref 26.0–34.0)
MCHC: 31.9 g/dL (ref 30.0–36.0)
MCV: 86.5 fL (ref 78.0–100.0)
Monocytes Absolute: 0 10*3/uL — ABNORMAL LOW (ref 0.1–1.0)
Monocytes Relative: 0 % — ABNORMAL LOW (ref 3–12)
Neutro Abs: 6.9 10*3/uL (ref 1.7–7.7)
Neutrophils Relative %: 88 % — ABNORMAL HIGH (ref 43–77)
PLATELETS: 292 10*3/uL (ref 150–400)
RBC: 4.31 MIL/uL (ref 4.22–5.81)
RDW: 14.5 % (ref 11.5–15.5)
WBC: 7.9 10*3/uL (ref 4.0–10.5)

## 2014-08-22 MED ORDER — CHLORPROMAZINE HCL 25 MG/ML IJ SOLN
25.0000 mg | Freq: Once | INTRAMUSCULAR | Status: DC
  Filled 2014-08-22: qty 1

## 2014-08-22 MED ORDER — PREDNISONE 20 MG PO TABS
40.0000 mg | ORAL_TABLET | Freq: Every day | ORAL | Status: DC
  Administered 2014-08-23 – 2014-08-25 (×3): 40 mg via ORAL
  Filled 2014-08-22 (×5): qty 2

## 2014-08-22 MED ORDER — FAMOTIDINE IN NACL 20-0.9 MG/50ML-% IV SOLN
20.0000 mg | Freq: Once | INTRAVENOUS | Status: AC
  Administered 2014-08-22: 20 mg via INTRAVENOUS
  Filled 2014-08-22: qty 50

## 2014-08-22 NOTE — Progress Notes (Signed)
Pt refused CPAP QHS for tonight.  States that it is uncomfortable and he is unable to tolerate it.  RT will monitor as needed.

## 2014-08-22 NOTE — Progress Notes (Signed)
[facility] PROGRESS NOTE  Travis Ferguson FHQ:[id number] DOB: 11/05/1940 DOA: 08/16/2014 PCP: Jerlyn Ly, MD  Assessment/Plan: #1 sepsis Secondary to gangrenous cholecystitis noted on CT of the abdomen and pelvis. Patient met criteria for sepsis secondary to tachycardia, leukocytosis, increased respiratory rate. C. difficile PCR was negative. Blood cultures are pending. Patient is status post percutaneous cholecystostomy drain with fluid cultures pending. Continue empiric IV Zosyn. General surgery and GI following.  #2 gangrenous cholecystitis Due to patient's comorbidities and pulmonary status was recommended by general surgery that patient undergo percutaneous cholecystostomy which was done per interventional radiology. Body fluid cultures are pending. Patient has been pancultured. Continue empiric IV Zosyn. Follow.  #3 acute COPD exacerbation/sarcoidosis Patient with both expiratory and inspiratory wheezing on examination. Continue steroids, scheduled nebulizers, oxygen, pulmonary toilet. Per pulmonary.  #4 choledocholithiasis per CT of the abdomen and pelvis.  Patient will need an ERCP for common bile duct stone retrieval per GI once his pulmonary status has improved. Pulmonary managing respiratory status. GI following and appreciate input and recommendations. GI to decide on ERCP.  #5 history of diastolic CHF Stable. Follow.  #6 hypertension Stable.  #7 hyperlipidemia Continue statin.  #8 acute renal failure Likely secondary to prerenal azotemia. Renal function slowly improving. Patient on clears. FENa = 0.13%. Renal function trending down. CXR negative for volume overload. Follow.   #9 prophylaxis Heparin for DVT prophylaxis.   Code Status: Full Family Communication: Updated patient, wife at bedside. Disposition Plan: Remain inpatient.   Consultants:  Gen. surgery: Dr. Hunt Oris 08/16/2014  Gastroenterology: Dr. Fuller Plan 08/17/2014  Interventional radiology: Dr.  Barbie Banner 08/17/2014  Pulmonary: Dr. Chase Caller 08/19/2014  Procedures:  CT abdomen and pelvis 08/16/2014  Chest x-ray 08/16/2014  Status post percutaneous cholecystostomy 08/17/2014 per Dr. Barbie Banner  Antibiotics:  IV Zosyn 08/17/2014  IV Flagyl 08/16/2014>>>>> 08/16/2014  IV ciprofloxacin 08/16/2014>>> 08/16/2014  IV Unasyn 08/16/2014>>>> 08/17/2014  HPI/Subjective: Patient states feeling better today. Patient states pain is better controlled. Patient feels wheezing is improving and at his baseline..   Objective: Filed Vitals:   08/22/14 0817  BP: 155/108  Pulse: 96  Temp:   Resp:     Intake/Output Summary (Last 24 hours) at 08/22/14 1129 Last data filed at 08/22/14 0549  Gross per 24 hour  Intake    300 ml  Output   1220 ml  Net   -920 ml   Filed Weights   08/16/14 1500  Weight: 119.296 kg (263 lb)    Exam:   General:  NAD  Cardiovascular: RRR  Respiratory: decreased Inspiratory and expiratory wheezing. No crackles.  Abdomen: Soft, nontender, nondistended, positive bowel sounds. Percutaneous drain with purulent brown output.  Musculoskeletal: No clubbing cyanosis or edema.  Data Reviewed: Basic Metabolic Panel:  Recent Labs Lab 08/17/14 0856 08/18/14 0510 08/19/14 0552 08/20/14 0445 08/21/14 0531 08/22/14 0512  NA  --  142 141 141 137 141  K  --  4.1 3.8 3.9 3.9 4.4  CL  --  114* 109 111 108 112  CO2  --  _0 GLUCOSE  --  171* 182* 163* 141* 118*  BUN  --  24* 33* 36* 36* 39*  CREATININE  --  1.12 1.55* 1.49* 1.25 1.43*  CALCIUM  --  8.5 8.9 8.6 8.3* 8.2*  MG 1.6 1.8  --   --   --   --    Liver Function Tests:  Recent Labs Lab 08/18/14 0510 08/19/14 4982 08/20/14 0445 08/21/14 0531 08/22/14 6415  AST _0 ALT _1 ALKPHOS 116 102 90 77 73  BILITOT 1.4* 1.1 0.9 1.0 1.0  PROT 6.4 6.5 5.9* 5.6* 5.3*  ALBUMIN 2.4* 2.4* 2.4* 2.5* 2.4*   No results for input(s): LIPASE, AMYLASE in the last 168  hours. No results for input(s): AMMONIA in the last 168 hours. CBC:  Recent Labs Lab 08/16/14 1040  08/18/14 0510 08/19/14 0552 08/20/14 0445 08/21/14 0531 08/22/14 0512  WBC 20.9*  < > 15.2* 16.5* 9.3 5.9 7.9  NEUTROABS 17.7*  --  14.2* 15.3*  --  5.1 6.9  HGB 12.5*  < > 11.5* 12.2* 11.8* 11.6* 11.9*  HCT 37.5*  < > 35.7* 37.7* 37.3* 37.1* 37.3*  MCV 82.2  < > 85.2 85.1 85.9 86.7 86.5  PLT 385  < > PLATELET CLUMPS NOTED ON SMEAR, COUNT APPEARS ADEQUATE 368 351 298 292  < > = values in this interval not displayed. Cardiac Enzymes:  Recent Labs Lab 08/19/14 1040 08/20/14 0445  TROPONINI <0.03 <0.03   BNP (last 3 results)  Recent Labs  08/19/14 1040  BNP 460.9*    ProBNP (last 3 results)  Recent Labs  03/17/14 1305  PROBNP 235.0*    CBG: No results for input(s): GLUCAP in the last 168 hours.  Recent Results (from the past 240 hour(s))  Blood culture (routine x 2)     Status: None   Collection Time: 08/16/14 12:27 PM  Result Value Ref Range Status   Specimen Description BLOOD RIGHT HAND  Final   Special Requests   Final    BOTTLES DRAWN AEROBIC AND ANAEROBIC 5CC BOTH BOTTLES   Culture   Final    NO GROWTH 5 DAYS Performed at [facility]    Report Status 08/22/2014 FINAL  Final  Blood culture (routine x 2)     Status: None   Collection Time: 08/16/14 12:27 PM  Result Value Ref Range Status   Specimen Description BLOOD RIGHT ARM  Final   Special Requests   Final    BOTTLES DRAWN AEROBIC AND ANAEROBIC 4CC BOTH BOTTLES   Culture   Final    NO GROWTH 5 DAYS Performed at [facility]    Report Status 08/22/2014 FINAL  Final  Clostridium Difficile by PCR     Status: None   Collection Time: 08/16/14  3:11 PM  Result Value Ref Range Status   C difficile by pcr NEGATIVE NEGATIVE Final    Comment: Performed at [facility]  Stool culture     Status: None   Collection Time: 08/16/14  3:11 PM  Result Value Ref Range Status    Specimen Description STOOL  Final   Special Requests NONE  Final   Culture   Final    NO SALMONELLA, SHIGELLA, CAMPYLOBACTER, YERSINIA, OR E.COLI 0157:H7 ISOLATED Performed at [facility]    Report Status 08/20/2014 FINAL  Final  Body fluid culture     Status: None   Collection Time: 08/17/14  2:18 PM  Result Value Ref Range Status   Specimen Description GALL BLADDER  Final   Special Requests NONE  Final   Gram Stain   Final    ABUNDANT WBC PRESENT,BOTH PMN AND MONONUCLEAR NO SQUAMOUS EPITHELIAL CELLS SEEN FEW GRAM POSITIVE COCCI IN PAIRS FEW GRAM NEGATIVE RODS Performed at [facility]    Culture   Final    MULTIPLE ORGANISMS PRESENT, NONE PREDOMINANT Note: NO GROUP A  STREP (S.PYOGENES) ISOLATED NO STAPHYLOCOCCUS AUREUS ISOLATED Performed at [facility]    Report Status 08/20/2014 FINAL  Final  Urine culture     Status: None   Collection Time: 08/19/14  7:09 PM  Result Value Ref Range Status   Specimen Description URINE, CLEAN CATCH  Final   Special Requests NONE  Final   Colony Count NO GROWTH Performed at [facility]   Final   Culture NO GROWTH Performed at [facility]   Final   Report Status 08/21/2014 FINAL  Final     Studies: No results found.  Scheduled Meds: . finasteride  5 mg Oral QPM  . FLUoxetine  40 mg Oral Q breakfast  . heparin  5,000 Units Subcutaneous 3 times per day  . ipratropium  0.5 mg Nebulization Q4H  . levalbuterol  0.63 mg Nebulization Q4H  . methylPREDNISolone (SOLU-MEDROL) injection  40 mg Intravenous 3 times per day  . metoprolol tartrate  25 mg Oral Q breakfast  . piperacillin-tazobactam (ZOSYN)  IV  3.375 g Intravenous 3 times per day  . simvastatin  20 mg Oral QPM  . traMADol  100 mg Oral 3 times per day  . traZODone  100 mg Oral QHS   Continuous Infusions: . sodium chloride 75 mL/hr (08/21/14 1851)    Principal Problem:   Sepsis Active Problems:   Gangrenous cholecystitis    Cholecystitis, acute with cholelithiasis   COPD exacerbation   COPD with chronic bronchitis   Essential hypertension   Hyperlipidemia   Diastolic CHF   Hypokalemia   Calculus of gallbladder with acute cholecystitis and obstruction   Choledocholithiasis   Acute on chronic respiratory failure   OSA (obstructive sleep apnea)   Preop respiratory exam   Acute kidney injury   Sarcoid   Wheezing   Other emphysema    Time spent: 67 minutes    Jerardo Costabile M.D. [facility] Pager [phone]. If 7PM-7AM, please contact night-coverage at [email or web address], password [facility] 08/22/2014, 11:29 AM  LOS: 6 days

## 2014-08-22 NOTE — Progress Notes (Signed)
[facility] Progress Note     Subjective: Doing better, but his lung disease is chronic, as he pointed out. His wife is in the room.  Objective: Vital signs in last 24 hours: Temp:  [97.7 F (36.5 C)-98.7 F (37.1 C)] 98.7 F (37.1 C) (02/29 0518) Pulse Rate:  [74-96] 96 (02/29 0817) Resp:  [18-24] 22 (02/29 0518) BP: (137-155)/(84-108) 155/108 mmHg (02/29 0817) SpO2:  [88 %-96 %] 95 % (02/29 0855) FiO2 (%):  [24 %] 24 % (02/28 1553) Last BM Date: 08/21/14  Intake/Output from previous day: 02/28 0701 - 02/29 0700 In: 1740 [P.O.:240; I.V.:1500] Out: 1220 [Urine:1125; Drains:95] Intake/Output this shift:    PE: Gen:  Alert, NAD, pleasant Card:  RRR, no M/G/R heard Pulm:  CTA, no W/R/R Abd: Soft, NT/ND, +BS, no HWM, no abdominal scars noted  Cholecystostomy tube drained 95 cc last 24 hours  Lab Results:   Recent Labs  08/21/14 0531 08/22/14 0512  WBC 5.9 7.9  HGB 11.6* 11.9*  HCT 37.1* 37.3*  PLT 298 292   BMET  Recent Labs  08/21/14 0531 08/22/14 0512  NA 137 141  K 3.9 4.4  CL 108 112  CO2 23 23  GLUCOSE 141* 118*  BUN 36* 39*  CREATININE 1.25 1.43*  CALCIUM 8.3* 8.2*   PT/INR No results for input(s): LABPROT, INR in the last 72 hours. CMP     Component Value Date/Time   NA 141 08/22/2014 0512   K 4.4 08/22/2014 0512   CL 112 08/22/2014 0512   CO2 23 08/22/2014 0512   GLUCOSE 118* 08/22/2014 0512   BUN 39* 08/22/2014 0512   CREATININE 1.43* 08/22/2014 0512   CALCIUM 8.2* 08/22/2014 0512   PROT 5.3* 08/22/2014 0512   ALBUMIN 2.4* 08/22/2014 0512   AST 23 08/22/2014 0512   ALT 23 08/22/2014 0512   ALKPHOS 73 08/22/2014 0512   BILITOT 1.0 08/22/2014 0512   GFRNONAA 47* 08/22/2014 0512   GFRAA 54* 08/22/2014 0512   Lipase  No results found for: LIPASE     Studies/Results: No results found.  Anti-infectives: Anti-infectives    Start     Dose/Rate Route Frequency Ordered Stop   08/17/14 1200  piperacillin-tazobactam  (ZOSYN) IVPB 3.375 g     3.375 g 12.5 mL/hr over 240 Minutes Intravenous 3 times per day 08/17/14 0811     08/16/14 1900  Ampicillin-Sulbactam (UNASYN) 3 g in sodium chloride 0.9 % 100 mL IVPB  Status:  Discontinued     3 g 100 mL/hr over 60 Minutes Intravenous Every 6 hours 08/16/14 1804 08/17/14 0811   08/16/14 1500  metroNIDAZOLE (FLAGYL) IVPB 500 mg  Status:  Discontinued     500 mg 100 mL/hr over 60 Minutes Intravenous 3 times per day 08/16/14 1348 08/16/14 1735   08/16/14 1400  ciprofloxacin (CIPRO) IVPB 400 mg  Status:  Discontinued     400 mg 200 mL/hr over 60 Minutes Intravenous Every 12 hours 08/16/14 1346 08/16/14 1735   08/16/14 1345  metroNIDAZOLE (FLAGYL) IVPB 500 mg  Status:  Discontinued     500 mg 100 mL/hr over 60 Minutes Intravenous Every 8 hours 08/16/14 1331 08/16/14 1348       Assessment/Plan Acute calculous cholecystitis with surround phlegmon s/p cholecystostomy (Dr. Barbie Banner) 08/17/14  -On IV abxs - Cipro/Flagyl 2/23, Zosyn started 08/17/14  -Improving, white count normalized Choledocholithiasis on CT-LFTs normalizing  T. Bili - 1.0 - 08/22/2014 -ERCP planned when pulmonary status more stable. COPD with chronic bronchitis -  Pulmonary following Essential hypertension Hx of Diastolic CHF DVT prophylaxis: Heparin/SCD Hyperlipidemia Body mass index is 35.66 kg/(m^2).     LOS: 6 days    Coralie Keens 08/22/2014, 9:39 AM Pager: [phone]  Agree with above. His lung disease is chronic.  They are trying to get him to a point where they can do the ERCP. He is doing well with the perc drain.  Alphonsa Overall, MD, [facility] Pager: [phone] Office phone:  [phone]

## 2014-08-22 NOTE — Progress Notes (Signed)
    Progress Note   Subjective  *Denies abdominal pain.  Continues to have some shortness of breath.**   Objective  Vital signs in last 24 hours: Temp:  [97.7 F (36.5 C)-98.7 F (37.1 C)] 98.7 F (37.1 C) (02/29 0518) Pulse Rate:  [74-96] 96 (02/29 0817) Resp:  [18-24] 22 (02/29 0518) BP: (137-155)/(84-108) 155/108 mmHg (02/29 0817) SpO2:  [88 %-96 %] 95 % (02/29 1155) FiO2 (%):  [24 %] 24 % (02/28 1553) Last BM Date: 08/21/14  Intake/Output from previous day: 02/28 0701 - 02/29 0700 In: 1740 [P.O.:240; I.V.:1500] Out: 1220 [Urine:1125; Drains:95] Intake/Output this shift:    Lab Results:  Recent Labs  08/20/14 0445 08/21/14 0531 08/22/14 0512  WBC 9.3 5.9 7.9  HGB 11.8* 11.6* 11.9*  HCT 37.3* 37.1* 37.3*  PLT 351 298 292   BMET  Recent Labs  08/20/14 0445 08/21/14 0531 08/22/14 0512  NA 141 137 141  K 3.9 3.9 4.4  CL 111 108 112  CO2 23 23 23   GLUCOSE 163* 141* 118*  BUN 36* 36* 39*  CREATININE 1.49* 1.25 1.43*  CALCIUM 8.6 8.3* 8.2*   LFT  Recent Labs  08/22/14 0512  PROT 5.3*  ALBUMIN 2.4*  AST 23  ALT 23  ALKPHOS 73  BILITOT 1.0   PT/INR No results for input(s): LABPROT, INR in the last 72 hours. Hepatitis Panel No results for input(s): HEPBSAG, HCVAB, HEPAIGM, HEPBIGM in the last 72 hours.  Studies/Results: No results found.    Assessment & Plan  S/P cholecystostomy due to acute calculous cholecystitis, feeling better. On IV antibiotics. LFTs improving. Pt noted to have choledocholithiasis on CT. Note normal LFTs.  Consider repeat imaging  by ultrasound and, if negative,  MRCP to confirm presence of bile duct stones before we subject him to an ERCP.  Principal Problem:   Sepsis Active Problems:   COPD with chronic bronchitis   Essential hypertension   Hyperlipidemia   Diastolic CHF   Gangrenous cholecystitis   Hypokalemia   Cholecystitis, acute with cholelithiasis   COPD exacerbation   Calculus of gallbladder with acute  cholecystitis and obstruction   Choledocholithiasis   Acute on chronic respiratory failure   OSA (obstructive sleep apnea)   Preop respiratory exam   Acute kidney injury   Sarcoid   Wheezing   Other emphysema     LOS: 6 days   Erskine Emery  08/22/2014, 1:25 PM

## 2014-08-22 NOTE — Progress Notes (Signed)
Subjective: Pt doing well today with no increased wob.  Sitting up in chair  Objective: Vital signs in last 24 hours: Blood pressure 155/108, pulse 96, temperature 98.7 F (37.1 C), temperature source Oral, resp. rate 22, height 6' (1.829 m), weight 119.296 kg (263 lb), SpO2 95 %.  Intake/Output from previous day: 02/28 0701 - 02/29 0700 In: 1740 [P.O.:240; I.V.:1500] Out: 1220 [Urine:1125; Drains:95]   Physical Exam:   Obese male in nad Nose without purulence or d/c noted. Neck without LN or TMG Chest with clear BS, no wheezing. Cor with rrr LE with mild edema, no cyanosis Alert and oriented, moves all 4.      Assessment/Plan:  1) COPD The pt is very stable from this standpoint, with no wheezing on exam or increased wob.  -continue nebulized BD -taper steroids  2) Sarcoidosis No evidence for ongoing flare.  CT chest very little difference from baseline.   3) OSA Continue on cpap qhs.    Ready for ERCP from pulm stand-point he is at baseline. Call us as needed. We will s/o   Erick Colace ACNP-BC [place] Pager # [phone] OR # [phone] if no answer   Attending Note:  I have examined patient, reviewed labs, studies and notes. I have discussed the case with Jerrye Bushy, and I agree with the data and plans as amended above.   Baltazar Apo, MD, PhD 08/24/2014, 9:16 AM [facility] [phone] or if no answer [phone]

## 2014-08-22 NOTE — Evaluation (Signed)
Physical Therapy Evaluation Patient Details Name: Travis Ferguson MRN: [id number] DOB: 11-14-1940 Today's Date: 08/22/2014   History of Present Illness  74 yo male admitted with sepsis. S/p chole perc drain 2/24. Hx of COPD, PTSD, HTN, sarcoidosis, chronic back pain-lumbar laminectomy 2014.   Clinical Impression  On eval, pt was Min guard assist for mobility-able to ambulate ~500 feet with RW. Remained on [place] O2-3L while ambulating-93%. However reassessed pt's resting RA O2 level once back in room and reading was 95%. Pt has DME (RW and cane) available for him to use at home as needed. Will follow during hospital stay.     Follow Up Recommendations No PT follow up;Supervision - Intermittent    Equipment Recommendations  None recommended by PT    Recommendations for Other Services OT consult     Precautions / Restrictions Precautions Precautions: Fall Restrictions Weight Bearing Restrictions: No      Mobility  Bed Mobility Overal bed mobility: Needs Assistance Bed Mobility: Supine to Sit;Sidelying to Sit   Sidelying to sit: Min guard Supine to sit: Min guard     General bed mobility comments: Modified sidelying to sit. Increased time. VCS for pt to logroll  Transfers Overall transfer level: Needs assistance Equipment used: Rolling walker (2 wheeled) Transfers: Sit to/from Stand Sit to Stand: Min guard         General transfer comment: close guard for safety. VCs safety, hand placement.   Ambulation/Gait Ambulation/Gait assistance: Min guard Ambulation Distance (Feet): 500 Feet Assistive device: Rolling walker (2 wheeled) Gait Pattern/deviations: Step-through pattern;Decreased stride length;Trunk flexed     General Gait Details: close guard for safety. VCs posture. 1 brief standing rest break. O2 sats 93% 3L.   Stairs            Wheelchair Mobility    Modified Rankin (Stroke Patients Only)       Balance Overall balance assessment: Needs assistance         Standing balance support: Single extremity supported;During functional activity Standing balance-Leahy Scale: Poor Standing balance comment: Needs at least 1 hand for support at this time.                              Pertinent Vitals/Pain Pain Assessment: 0-10 Pain Score: 2  Pain Location: back-chronic Pain Descriptors / Indicators: Sore;Aching Pain Intervention(s): Monitored during session;Repositioned    Home Living Family/patient expects to be discharged to:: Private residence Living Arrangements: Spouse/significant other   Type of Home: House Home Access: Stairs to enter Entrance Stairs-Rails: Right Entrance Stairs-Number of Steps: 3-4 Home Layout: One level Home Equipment: Environmental consultant - 2 wheels;Cane - single point;Grab bars - tub/shower      Prior Function Level of Independence: Independent with assistive device(s)         Comments: uses cane intermittently     Hand Dominance        Extremity/Trunk Assessment   Upper Extremity Assessment: Defer to OT evaluation           Lower Extremity Assessment: Generalized weakness      Cervical / Trunk Assessment: Kyphotic  Communication   Communication: No difficulties  Cognition Arousal/Alertness: Awake/alert Behavior During Therapy: WFL for tasks assessed/performed Overall Cognitive Status: Within Functional Limits for tasks assessed                      General Comments      Exercises  Assessment/Plan    PT Assessment Patient needs continued PT services  PT Diagnosis Difficulty walking;Generalized weakness   PT Problem List Decreased strength;Decreased activity tolerance;Decreased balance;Decreased mobility;Decreased knowledge of use of DME;Pain  PT Treatment Interventions DME instruction;Gait training;Functional mobility training;Therapeutic activities;Therapeutic exercise;Patient/family education;Balance training   PT Goals (Current goals can be found in the  Care Plan section) Acute Rehab PT Goals Patient Stated Goal: home soon PT Goal Formulation: With patient/family Time For Goal Achievement: 09/05/14 Potential to Achieve Goals: Good    Frequency Min 3X/week   Barriers to discharge        Co-evaluation               End of Session Equipment Utilized During Treatment: Oxygen Activity Tolerance: Patient tolerated treatment well Patient left: in chair;with call bell/phone within reach;with family/visitor present           Time: 9407-6808 PT Time Calculation (min) (ACUTE ONLY): 40 min   Charges:   PT Evaluation $Initial PT Evaluation Tier I: 1 Procedure PT Treatments $Gait Training: 8-22 mins   PT G Codes:        Weston Anna, MPT Pager: [phone]

## 2014-08-22 NOTE — Evaluation (Signed)
Occupational Therapy Evaluation Patient Details Name: Travis Ferguson MRN: [id number] DOB: February 28, 1941 Today's Date: 08/22/2014    History of Present Illness 74 yo male admitted with sepsis. S/p chole perc drain 2/24. Hx of COPD, PTSD, HTN, sarcoidosis, chronic back pain-lumbar laminectomy 2014.    Clinical Impression   Pt was independent in self care prior to admission.  Because of his hx of back surgery and chronic pain, he had some difficulty at times with LB dressing.  He has AE from prior surgery.  Pt presents with generalized weakness and impaired balance interfering with ability to perform self care.  Will follow acutely.  Do not anticipate pt will need post acute OT.    Follow Up Recommendations  No OT follow up    Equipment Recommendations  None recommended by OT    Recommendations for Other Services       Precautions / Restrictions Precautions Precautions: Fall Restrictions Weight Bearing Restrictions: No      Mobility Bed Mobility Overal bed mobility: Needs Assistance Bed Mobility: Supine to Sit;Sidelying to Sit   Sidelying to sit: Min guard Supine to sit: Min guard     General bed mobility comments: Modified sidelying to sit. Increased time. VCS for pt to logroll  Transfers Overall transfer level: Needs assistance Equipment used: Rolling walker (2 wheeled) Transfers: Sit to/from Stand Sit to Stand: Min guard         General transfer comment: close guard for safety. VCs safety, hand placement.     Balance Overall balance assessment: Needs assistance         Standing balance support: Single extremity supported;During functional activity Standing balance-Leahy Scale: Poor Standing balance comment: Needs at least 1 hand for support at this time.                             ADL Overall ADL's : Needs assistance/impaired Eating/Feeding: Independent;Sitting   Grooming: Wash/dry hands;Wash/dry face;Sitting;Set up Grooming Details  (indicate cue type and reason): pt unable to release walker to perform standing grooming Upper Body Bathing: Minimal assitance;Sitting Upper Body Bathing Details (indicate cue type and reason): assist for back Lower Body Bathing: Maximal assistance;Sit to/from stand   Upper Body Dressing : Set up;Sitting   Lower Body Dressing: Sit to/from stand;Maximal assistance Lower Body Dressing Details (indicate cue type and reason): for pants and socks Toilet Transfer: Min guard;Comfort height toilet;RW   Toileting- Clothing Manipulation and Hygiene: Maximal assistance;Sit to/from stand       Functional mobility during ADLs: Min guard;Rolling walker General ADL Comments: Pt has AE at home from previous back surgery.     Vision     Perception     Praxis      Pertinent Vitals/Pain Pain Assessment: 0-10 Pain Score: 2  Pain Location: back, chronic Pain Descriptors / Indicators: Aching;Sore Pain Intervention(s): Monitored during session;Repositioned     Hand Dominance     Extremity/Trunk Assessment Upper Extremity Assessment Upper Extremity Assessment: Defer to OT evaluation   Lower Extremity Assessment Lower Extremity Assessment: Generalized weakness   Cervical / Trunk Assessment Cervical / Trunk Assessment: Kyphotic   Communication Communication Communication: No difficulties   Cognition Arousal/Alertness: Awake/alert Behavior During Therapy: WFL for tasks assessed/performed Overall Cognitive Status: Within Functional Limits for tasks assessed                     General Comments       Exercises  Shoulder Instructions      Home Living Family/patient expects to be discharged to:: Private residence Living Arrangements: Spouse/significant other Available Help at Discharge: Family;Available 24 hours/day Type of Home: House Home Access: Stairs to enter [employer] of Steps: 3-4 Entrance Stairs-Rails: Right Home Layout: One level      Bathroom Shower/Tub: Teacher, early years/pre: Handicapped height     Home Equipment: Environmental consultant - 2 wheels;Cane - single point;Grab bars - tub/shower;Adaptive equipment Adaptive Equipment: Reacher;Sock aid;Long-handled shoe horn;Long-handled sponge        Prior Functioning/Environment Level of Independence: Independent with assistive device(s)        Comments: uses cane intermittently    OT Diagnosis: Generalized weakness;Acute pain   OT Problem List: Decreased strength;Impaired balance (sitting and/or standing);Decreased activity tolerance;Obesity;Pain   OT Treatment/Interventions: Self-care/ADL training;Therapeutic activities;DME and/or AE instruction;Balance training;Patient/family education    OT Goals(Current goals can be found in the care plan section) Acute Rehab OT Goals Patient Stated Goal: home soon OT Goal Formulation: With patient Time For Goal Achievement: 09/05/14 Potential to Achieve Goals: Good ADL Goals Pt Will Perform Grooming: with supervision;standing Pt Will Perform Lower Body Bathing: with supervision;with adaptive equipment;sit to/from stand Pt Will Perform Lower Body Dressing: with supervision;with adaptive equipment;sit to/from stand Pt Will Transfer to Toilet: with supervision;ambulating Pt Will Perform Toileting - Clothing Manipulation and hygiene: with supervision;sit to/from stand Pt Will Perform Tub/Shower Transfer: Tub transfer;with min guard assist;ambulating;rolling walker (determine need for tub equipment)  OT Frequency: Min 2X/week   Barriers to D/C:            Co-evaluation PT/OT/SLP Co-Evaluation/Treatment: Yes Reason for Co-Treatment: Complexity of the patient's impairments (multi-system involvement)   OT goals addressed during session: ADL's and self-care      End of Session Equipment Utilized During Treatment: Gait belt;Rolling walker;Oxygen Nurse Communication:  (02 off, sats in mid 90s in chair)  Activity  Tolerance: Patient tolerated treatment well Patient left: in chair;with call bell/phone within reach;with family/visitor present   Time: 6734-1937 OT Time Calculation (min): 36 min Charges:  OT General Charges $OT Visit: 1 Procedure OT Evaluation $Initial OT Evaluation Tier I: 1 Procedure G-Codes:    Malka So 08/22/2014, 12:45 PM  [phone]

## 2014-08-23 ENCOUNTER — Inpatient Hospital Stay (HOSPITAL_COMMUNITY): Payer: Medicare Other

## 2014-08-23 LAB — BASIC METABOLIC PANEL
Anion gap: 5 (ref 5–15)
BUN: 31 mg/dL — AB (ref 6–23)
CO2: 23 mmol/L (ref 19–32)
CREATININE: 1.21 mg/dL (ref 0.50–1.35)
Calcium: 8.1 mg/dL — ABNORMAL LOW (ref 8.4–10.5)
Chloride: 111 mmol/L (ref 96–112)
GFR calc Af Amer: 66 mL/min — ABNORMAL LOW (ref >90)
GFR calc non Af Amer: 57 mL/min — ABNORMAL LOW (ref >90)
Glucose, Bld: 88 mg/dL (ref 70–99)
POTASSIUM: 3.9 mmol/L (ref 3.5–5.1)
SODIUM: 139 mmol/L (ref 135–145)

## 2014-08-23 LAB — CBC
HEMATOCRIT: 37.8 % — AB (ref 39.0–52.0)
Hemoglobin: 11.8 g/dL — ABNORMAL LOW (ref 13.0–17.0)
MCH: 27.1 pg (ref 26.0–34.0)
MCHC: 31.2 g/dL (ref 30.0–36.0)
MCV: 86.7 fL (ref 78.0–100.0)
PLATELETS: 258 10*3/uL (ref 150–400)
RBC: 4.36 MIL/uL (ref 4.22–5.81)
RDW: 14.3 % (ref 11.5–15.5)
WBC: 7.2 10*3/uL (ref 4.0–10.5)

## 2014-08-23 LAB — PROCALCITONIN: Procalcitonin: <0.1 ng/mL

## 2014-08-23 MED ORDER — GADOBENATE DIMEGLUMINE 529 MG/ML IV SOLN
20.0000 mL | Freq: Once | INTRAVENOUS | Status: AC | PRN
  Administered 2014-08-23: 20 mL via INTRAVENOUS

## 2014-08-23 NOTE — Progress Notes (Signed)
Patient ID: Travis Ferguson, male   DOB: October 18, 1940, 74 y.o.   MRN: [id number]    Progress Note   Subjective  Day # 6 post cholecystostomy .  No c/o abdominal pain. Breathing about back to his baseline  [place] -unable to visualize CBD   Objective   Vital signs in last 24 hours: Temp:  [97.5 F (36.4 C)-97.8 F (36.6 C)] 97.5 F (36.4 C) (03/01 0605) Pulse Rate:  [93-112] 94 (03/01 0845) Resp:  [20-22] 20 (03/01 0605) BP: (129-140)/(83-88) 129/88 mmHg (03/01 0845) SpO2:  [89 %-97 %] 95 % (03/01 0736) Last BM Date: 08/21/14 General:    white male  in NAD Heart:  Regular rate and rhythm; no murmurs Lungs: Respirations even scattered rhochi and faint wheezes Abdomen:  Soft, nontender and nondistended. Normal bowel sounds. Extremities:  Without edema. Neurologic:  Alert and oriented,  grossly normal neurologically. Psych:  Cooperative. Normal mood and affect.  Intake/Output from previous day: 02/29 0701 - 03/01 0700 In: 2400 [P.O.:600; I.V.:1800] Out: 1250 [Urine:1200; Drains:50] Intake/Output this shift: Total I/O In: 0  Out: 40 [Drains:40]  Lab Results:  Recent Labs  08/21/14 0531 08/22/14 0512 08/23/14 0514  WBC 5.9 7.9 7.2  HGB 11.6* 11.9* 11.8*  HCT 37.1* 37.3* 37.8*  PLT 298 292 258   BMET  Recent Labs  08/21/14 0531 08/22/14 0512 08/23/14 0514  NA 137 141 139  K 3.9 4.4 3.9  CL 108 112 111  CO2 23 23 23   GLUCOSE 141* 118* 88  BUN 36* 39* 31*  CREATININE 1.25 1.43* 1.21  CALCIUM 8.3* 8.2* 8.1*   LFT  Recent Labs  08/22/14 0512  PROT 5.3*  ALBUMIN 2.4*  AST 23  ALT 23  ALKPHOS 73  BILITOT 1.0   PT/INR No results for input(s): LABPROT, INR in the last 72 hours.  Studies/Results: US Abdomen Complete  08/23/2014   CLINICAL DATA:  Choledocholithiasis.  Cholecystostomy 08/17/2014.  EXAM: ULTRASOUND ABDOMEN COMPLETE  COMPARISON:  08/17/2014.  CT 08/16/2014.  FINDINGS: Gallbladder: Gallbladder decompressed with multiple stones. Gallbladder wall  thickening 4.9 mm.  Common bile duct: Unable to visualize due to the limited windows and bowel gas.  Liver: Normal were visualized. Limited windows due to overlying dressing.  IVC: No abnormality visualized.  Pancreas: Non visualization due to bowel gas.  Spleen: Size and appearance within normal limits.  Right Kidney: Length: 12.8 cm. Echogenicity within normal limits. No mass or hydronephrosis visualized.  Left Kidney: Length: 12.7 cm. Echogenicity within normal limits. No mass or hydronephrosis visualized.  Abdominal aorta: Limited visualization due to bowel gas.  Other findings: Limited exam due to overlying bandaging and bowel gas.  IMPRESSION: 1. Decompressed gallbladder with multiple stones. Gallbladder wall thickening. No pericholecystic fluid collections. Common bile duct not visualized. 2. Limited exam due to overlying bandaging and bowel gas.   Electronically Signed   By: Marcello Moores  Register   On: 08/23/2014 07:15       Assessment / Plan:   #1  74 yo male with acute calculous cholecystitis -s/p cholecystostomy 2/24...stable and improved  On Zopsyn #2 ?choledocholithiasis - liver tests have normalized . [place] not helpful as unable to visualize CBD. Will proceed with MRCP today . Hopefully can avoid ERCP. Advance diet post MRCP #3 sarcoidosis and COPD- stabilizing   Principal Problem:   Sepsis Active Problems:   COPD with chronic bronchitis   Essential hypertension   Hyperlipidemia   Diastolic CHF   Gangrenous cholecystitis   Hypokalemia   Cholecystitis,  acute with cholelithiasis   COPD exacerbation   Calculus of gallbladder with acute cholecystitis and obstruction   Choledocholithiasis   Acute on chronic respiratory failure   OSA (obstructive sleep apnea)   Preop respiratory exam   Acute kidney injury   Sarcoid   Wheezing   Other emphysema     LOS: 7 days   [name]  08/23/2014, 10:26 AM   GI Attending Note  I have personally taken an interval history, reviewed the  chart, and examined the patient.  I agree with the extender's note, impression and recommendations.  Sandy Salaam. Deatra Ina, MD, [facility] [phone]

## 2014-08-23 NOTE — Progress Notes (Signed)
[facility] PROGRESS NOTE  Travis Ferguson XLK:[id number] DOB: 06-24-1941 DOA: 08/16/2014 PCP: Jerlyn Ly, MD  Assessment/Plan: #1 sepsis Secondary to gangrenous cholecystitis noted on CT of the abdomen and pelvis. Patient met criteria for sepsis secondary to tachycardia, leukocytosis, increased respiratory rate. C. difficile PCR was negative. Blood cultures are pending. Patient is status post percutaneous cholecystostomy drain with fluid cultures pending. Continue empiric IV Zosyn. General surgery and GI following.  #2 gangrenous cholecystitis Due to patient's comorbidities and pulmonary status was recommended by general surgery that patient undergo percutaneous cholecystostomy which was done per interventional radiology. Body fluid cultures are pending. Patient has been pancultured. Continue empiric IV Zosyn. Follow.  #3 acute COPD exacerbation/sarcoidosis Patient with both expiratory and inspiratory wheezing on examination. Continue steroids taper, scheduled nebulizers, oxygen, pulmonary toilet. Per pulmonary.  #4 choledocholithiasis per CT of the abdomen and pelvis.  Patient will need an ERCP for common bile duct stone retrieval per GI once his pulmonary status has improved. Pulmonary managing respiratory status. GI following and appreciate input and recommendations. Abdominal ultrasound negative. Patient will likely need a MRCP prior to decision of ERCP per GI. GI to decide on ERCP.  #5 history of diastolic CHF Stable. Follow.  #6 hypertension Stable.  #7 hyperlipidemia Continue statin.  #8 acute renal failure Likely secondary to prerenal azotemia. Renal function slowly improving. Patient on clears. FENa = 0.13%. Renal function trending down. CXR negative for volume overload. NSL IVF. Follow.   #9 prophylaxis Heparin for DVT prophylaxis.   Code Status: Full Family Communication: Updated patient, wife at bedside. Disposition Plan: Remain  inpatient.   Consultants:  Gen. surgery: Dr. Hunt Oris 08/16/2014  Gastroenterology: Dr. Fuller Plan 08/17/2014  Interventional radiology: Dr. Barbie Banner 08/17/2014  Pulmonary: Dr. Chase Caller 08/19/2014  Procedures:  CT abdomen and pelvis 08/16/2014  Chest x-ray 08/16/2014  Status post percutaneous cholecystostomy 08/17/2014 per Dr. Barbie Banner  [place] abd 08/23/14  Antibiotics:  IV Zosyn 08/17/2014  IV Flagyl 08/16/2014>>>>> 08/16/2014  IV ciprofloxacin 08/16/2014>>> 08/16/2014  IV Unasyn 08/16/2014>>>> 08/17/2014  HPI/Subjective: Patient states feeling better today. Patient states pain is better controlled. Patient feels wheezing is improving and at his baseline. No complaints.  Objective: Filed Vitals:   08/23/14 1358  BP: 154/87  Pulse: 73  Temp: 97.4 F (36.3 C)  Resp: 18    Intake/Output Summary (Last 24 hours) at 08/23/14 1502 Last data filed at 08/23/14 1359  Gross per 24 hour  Intake   1800 ml  Output   1670 ml  Net    130 ml   Filed Weights   08/16/14 1500  Weight: 119.296 kg (263 lb)    Exam:   General:  NAD  Cardiovascular: RRR  Respiratory: decreased Inspiratory and expiratory wheezing. No crackles.  Abdomen: Soft, nontender, nondistended, positive bowel sounds. Percutaneous drain with purulent brown output.  Musculoskeletal: No clubbing cyanosis or edema.  Data Reviewed: Basic Metabolic Panel:  Recent Labs Lab 08/17/14 0856 08/18/14 0510 08/19/14 3664 08/20/14 0445 08/21/14 0531 08/22/14 0512 08/23/14 0514  NA  --  142 141 141 137 141 139  K  --  4.1 3.8 3.9 3.9 4.4 3.9  CL  --  114* 109 111 108 112 111  CO2  --  _0 GLUCOSE  --  171* 182* 163* 141* 118* 88  BUN  --  24* 33* 36* 36* 39* 31*  CREATININE  --  1.12 1.55* 1.49* 1.25 1.43* 1.21  CALCIUM  --  8.5 8.9 8.6 8.3*  8.2* 8.1*  MG 1.6 1.8  --   --   --   --   --    Liver Function Tests:  Recent Labs Lab 08/18/14 0510 08/19/14 0552 08/20/14 0445 08/21/14 0531  08/22/14 0512  AST _0 ALT _1 ALKPHOS 116 102 90 77 73  BILITOT 1.4* 1.1 0.9 1.0 1.0  PROT 6.4 6.5 5.9* 5.6* 5.3*  ALBUMIN 2.4* 2.4* 2.4* 2.5* 2.4*   No results for input(s): LIPASE, AMYLASE in the last 168 hours. No results for input(s): AMMONIA in the last 168 hours. CBC:  Recent Labs Lab 08/18/14 0510 08/19/14 0552 08/20/14 0445 08/21/14 0531 08/22/14 0512 08/23/14 0514  WBC 15.2* 16.5* 9.3 5.9 7.9 7.2  NEUTROABS 14.2* 15.3*  --  5.1 6.9  --   HGB 11.5* 12.2* 11.8* 11.6* 11.9* 11.8*  HCT 35.7* 37.7* 37.3* 37.1* 37.3* 37.8*  MCV 85.2 85.1 85.9 86.7 86.5 86.7  PLT PLATELET CLUMPS NOTED ON SMEAR, COUNT APPEARS ADEQUATE 368 351 298 292 258   Cardiac Enzymes:  Recent Labs Lab 08/19/14 1040 08/20/14 0445  TROPONINI <0.03 <0.03   BNP (last 3 results)  Recent Labs  08/19/14 1040  BNP 460.9*    ProBNP (last 3 results)  Recent Labs  03/17/14 1305  PROBNP 235.0*    CBG: No results for input(s): GLUCAP in the last 168 hours.  Recent Results (from the past 240 hour(s))  Blood culture (routine x 2)     Status: None   Collection Time: 08/16/14 12:27 PM  Result Value Ref Range Status   Specimen Description BLOOD RIGHT HAND  Final   Special Requests   Final    BOTTLES DRAWN AEROBIC AND ANAEROBIC 5CC BOTH BOTTLES   Culture   Final    NO GROWTH 5 DAYS Performed at [facility]    Report Status 08/22/2014 FINAL  Final  Blood culture (routine x 2)     Status: None   Collection Time: 08/16/14 12:27 PM  Result Value Ref Range Status   Specimen Description BLOOD RIGHT ARM  Final   Special Requests   Final    BOTTLES DRAWN AEROBIC AND ANAEROBIC 4CC BOTH BOTTLES   Culture   Final    NO GROWTH 5 DAYS Performed at [facility]    Report Status 08/22/2014 FINAL  Final  Clostridium Difficile by PCR     Status: None   Collection Time: 08/16/14  3:11 PM  Result Value Ref Range Status   C difficile by pcr NEGATIVE NEGATIVE  Final    Comment: Performed at [facility]  Stool culture     Status: None   Collection Time: 08/16/14  3:11 PM  Result Value Ref Range Status   Specimen Description STOOL  Final   Special Requests NONE  Final   Culture   Final    NO SALMONELLA, SHIGELLA, CAMPYLOBACTER, YERSINIA, OR E.COLI 0157:H7 ISOLATED Performed at [facility]    Report Status 08/20/2014 FINAL  Final  Body fluid culture     Status: None   Collection Time: 08/17/14  2:18 PM  Result Value Ref Range Status   Specimen Description GALL BLADDER  Final   Special Requests NONE  Final   Gram Stain   Final    ABUNDANT WBC PRESENT,BOTH PMN AND MONONUCLEAR NO SQUAMOUS EPITHELIAL CELLS SEEN FEW GRAM POSITIVE COCCI IN PAIRS FEW GRAM NEGATIVE RODS Performed at [facility]  Culture   Final    MULTIPLE ORGANISMS PRESENT, NONE PREDOMINANT Note: NO GROUP A STREP (S.PYOGENES) ISOLATED NO STAPHYLOCOCCUS AUREUS ISOLATED Performed at [facility]    Report Status 08/20/2014 FINAL  Final  Urine culture     Status: None   Collection Time: 08/19/14  7:09 PM  Result Value Ref Range Status   Specimen Description URINE, CLEAN CATCH  Final   Special Requests NONE  Final   Colony Count NO GROWTH Performed at [facility]   Final   Culture NO GROWTH Performed at [facility]   Final   Report Status 08/21/2014 FINAL  Final     Studies: US Abdomen Complete  08/23/2014   CLINICAL DATA:  Choledocholithiasis.  Cholecystostomy 08/17/2014.  EXAM: ULTRASOUND ABDOMEN COMPLETE  COMPARISON:  08/17/2014.  CT 08/16/2014.  FINDINGS: Gallbladder: Gallbladder decompressed with multiple stones. Gallbladder wall thickening 4.9 mm.  Common bile duct: Unable to visualize due to the limited windows and bowel gas.  Liver: Normal were visualized. Limited windows due to overlying dressing.  IVC: No abnormality visualized.  Pancreas: Non visualization due to bowel gas.  Spleen: Size and appearance within  normal limits.  Right Kidney: Length: 12.8 cm. Echogenicity within normal limits. No mass or hydronephrosis visualized.  Left Kidney: Length: 12.7 cm. Echogenicity within normal limits. No mass or hydronephrosis visualized.  Abdominal aorta: Limited visualization due to bowel gas.  Other findings: Limited exam due to overlying bandaging and bowel gas.  IMPRESSION: 1. Decompressed gallbladder with multiple stones. Gallbladder wall thickening. No pericholecystic fluid collections. Common bile duct not visualized. 2. Limited exam due to overlying bandaging and bowel gas.   Electronically Signed   By: Marcello Moores  Register   On: 08/23/2014 07:15    Scheduled Meds: . chlorproMAZINE (THORAZINE) injection  25 mg Intramuscular Once  . finasteride  5 mg Oral QPM  . FLUoxetine  40 mg Oral Q breakfast  . heparin  5,000 Units Subcutaneous 3 times per day  . ipratropium  0.5 mg Nebulization Q4H  . levalbuterol  0.63 mg Nebulization Q4H  . metoprolol tartrate  25 mg Oral Q breakfast  . piperacillin-tazobactam (ZOSYN)  IV  3.375 g Intravenous 3 times per day  . predniSONE  40 mg Oral Q breakfast  . simvastatin  20 mg Oral QPM  . traMADol  100 mg Oral 3 times per day  . traZODone  100 mg Oral QHS   Continuous Infusions: . sodium chloride 75 mL/hr (08/23/14 1206)    Principal Problem:   Sepsis Active Problems:   Gangrenous cholecystitis   Cholecystitis, acute with cholelithiasis   COPD exacerbation   COPD with chronic bronchitis   Essential hypertension   Hyperlipidemia   Diastolic CHF   Hypokalemia   Calculus of gallbladder with acute cholecystitis and obstruction   Choledocholithiasis   Acute on chronic respiratory failure   OSA (obstructive sleep apnea)   Preop respiratory exam   Acute kidney injury   Sarcoid   Wheezing   Other emphysema    Time spent: 76 minutes    Tish Begin M.D. [facility] Pager [phone]. If 7PM-7AM, please contact night-coverage at [email or web address],  password [facility] 08/23/2014, 3:02 PM  LOS: 7 days

## 2014-08-23 NOTE — Progress Notes (Signed)
Pt refused CPAP QHS.  RT will continue to monitor as needed.  RN aware.

## 2014-08-23 NOTE — Progress Notes (Signed)
Occupational Therapy Treatment Patient Details Name: VAUGHAN GARFINKLE MRN: [id number] DOB: Nov 11, 1940 Today's Date: 08/23/2014    History of present illness 74 yo male admitted with sepsis. S/p chole perc drain 2/24. Hx of COPD, PTSD, HTN, sarcoidosis, chronic back pain-lumbar laminectomy 2014.    OT comments  Focused on safety with walker for functional transfers into the bathroom for grooming. Wife present. Pt needs cues for safe use of walker. O2 on RA after functional transfer into the bathroom and back to chair was 93%.    Follow Up Recommendations  No OT follow up;Supervision/Assistance - 24 hour    Equipment Recommendations  None recommended by OT    Recommendations for Other Services      Precautions / Restrictions Precautions Precautions: Fall Precaution Comments: monitor O2       Mobility Bed Mobility                  Transfers Overall transfer level: Needs assistance Equipment used: Rolling walker (2 wheeled) Transfers: Sit to/from Stand Sit to Stand: Min guard         General transfer comment: cues for safety. cues to back up fully to chair before sitting.    Balance                                   ADL       Grooming: Wash/dry hands;Oral care;Min guard;Standing;Wash/dry face                                 General ADL Comments: Min guard to stand and use urinal in the bathroom. Stood at the sink to brush his teeth and wash his face. O2 sats upon return to recliner 93% on RA. Pt declines need to practice with AE for LB self care-states he is familiar with use. Needs some cues for walker safety as he tends to pick it up at times and step away from it. Min guard assist for functional mobility with the walker. Cues to back up to chair fully with the walker before letting go to reach back.       Vision                     Perception     Praxis      Cognition   Behavior During Therapy: WFL for tasks  assessed/performed Overall Cognitive Status: Within Functional Limits for tasks assessed                       Extremity/Trunk Assessment               Exercises     Shoulder Instructions       General Comments      Pertinent Vitals/ Pain       Pain Assessment: Faces Faces Pain Scale: Hurts a little bit Pain Location: back chronic Pain Descriptors / Indicators: Aching Pain Intervention(s): Monitored during session  Home Living                                          Prior Functioning/Environment              Frequency Min 2X/week     Progress Toward  Goals  OT Goals(current goals can now be found in the care plan section)  Progress towards OT goals: Progressing toward goals     Plan Discharge plan remains appropriate    Co-evaluation                 End of Session Equipment Utilized During Treatment: Rolling walker   Activity Tolerance Patient tolerated treatment well   Patient Left in chair;with call bell/phone within reach;with family/visitor present   Nurse Communication          Time: 1245-1310 OT Time Calculation (min): 25 min  Charges: OT General Charges $OT Visit: 1 Procedure OT Treatments $Self Care/Home Management : 8-22 mins  Jules Schick  [phone] 08/23/2014, 1:24 PM

## 2014-08-23 NOTE — Progress Notes (Signed)
[facility] Progress Note     Subjective: Pt denies N/V or abdominal pain.  Perc chole drain working well.  Tolerating clears when eating them.  No complaints.  Objective: Vital signs in last 24 hours: Temp:  [97.5 F (36.4 C)-97.8 F (36.6 C)] 97.5 F (36.4 C) (03/01 0605) Pulse Rate:  [93-112] 101 (03/01 0605) Resp:  [20-22] 20 (03/01 0605) BP: (136-155)/(83-108) 137/85 mmHg (03/01 0605) SpO2:  [89 %-97 %] 96 % (03/01 0605) Last BM Date: 08/21/14  Intake/Output from previous day: 02/29 0701 - 03/01 0700 In: 2400 [P.O.:600; I.V.:1800] Out: 1250 [Urine:1200; Drains:50] Intake/Output this shift:    PE: Gen:  Alert, NAD, pleasant Abd: Obese, soft, NT/ND, +BS, no HSM, IR perc chole drain with green bilious drainage   Lab Results:   Recent Labs  08/22/14 0512 08/23/14 0514  WBC 7.9 7.2  HGB 11.9* 11.8*  HCT 37.3* 37.8*  PLT 292 258   BMET  Recent Labs  08/22/14 0512 08/23/14 0514  NA 141 139  K 4.4 3.9  CL 112 111  CO2 23 23  GLUCOSE 118* 88  BUN 39* 31*  CREATININE 1.43* 1.21  CALCIUM 8.2* 8.1*   PT/INR No results for input(s): LABPROT, INR in the last 72 hours. CMP     Component Value Date/Time   NA 139 08/23/2014 0514   K 3.9 08/23/2014 0514   CL 111 08/23/2014 0514   CO2 23 08/23/2014 0514   GLUCOSE 88 08/23/2014 0514   BUN 31* 08/23/2014 0514   CREATININE 1.21 08/23/2014 0514   CALCIUM 8.1* 08/23/2014 0514   PROT 5.3* 08/22/2014 0512   ALBUMIN 2.4* 08/22/2014 0512   AST 23 08/22/2014 0512   ALT 23 08/22/2014 0512   ALKPHOS 73 08/22/2014 0512   BILITOT 1.0 08/22/2014 0512   GFRNONAA 57* 08/23/2014 0514   GFRAA 66* 08/23/2014 0514   Lipase  No results found for: LIPASE     Studies/Results: US Abdomen Complete  08/23/2014   CLINICAL DATA:  Choledocholithiasis.  Cholecystostomy 08/17/2014.  EXAM: ULTRASOUND ABDOMEN COMPLETE  COMPARISON:  08/17/2014.  CT 08/16/2014.  FINDINGS: Gallbladder: Gallbladder decompressed with  multiple stones. Gallbladder wall thickening 4.9 mm.  Common bile duct: Unable to visualize due to the limited windows and bowel gas.  Liver: Normal were visualized. Limited windows due to overlying dressing.  IVC: No abnormality visualized.  Pancreas: Non visualization due to bowel gas.  Spleen: Size and appearance within normal limits.  Right Kidney: Length: 12.8 cm. Echogenicity within normal limits. No mass or hydronephrosis visualized.  Left Kidney: Length: 12.7 cm. Echogenicity within normal limits. No mass or hydronephrosis visualized.  Abdominal aorta: Limited visualization due to bowel gas.  Other findings: Limited exam due to overlying bandaging and bowel gas.  IMPRESSION: 1. Decompressed gallbladder with multiple stones. Gallbladder wall thickening. No pericholecystic fluid collections. Common bile duct not visualized. 2. Limited exam due to overlying bandaging and bowel gas.   Electronically Signed   By: Marcello Moores  Register   On: 08/23/2014 07:15    Anti-infectives: Anti-infectives    Start     Dose/Rate Route Frequency Ordered Stop   08/17/14 1200  piperacillin-tazobactam (ZOSYN) IVPB 3.375 g     3.375 g 12.5 mL/hr over 240 Minutes Intravenous 3 times per day 08/17/14 0811     08/16/14 1900  Ampicillin-Sulbactam (UNASYN) 3 g in sodium chloride 0.9 % 100 mL IVPB  Status:  Discontinued     3 g 100 mL/hr over 60 Minutes Intravenous Every  6 hours 08/16/14 1804 08/17/14 0811   08/16/14 1500  metroNIDAZOLE (FLAGYL) IVPB 500 mg  Status:  Discontinued     500 mg 100 mL/hr over 60 Minutes Intravenous 3 times per day 08/16/14 1348 08/16/14 1735   08/16/14 1400  ciprofloxacin (CIPRO) IVPB 400 mg  Status:  Discontinued     400 mg 200 mL/hr over 60 Minutes Intravenous Every 12 hours 08/16/14 1346 08/16/14 1735   08/16/14 1345  metroNIDAZOLE (FLAGYL) IVPB 500 mg  Status:  Discontinued     500 mg 100 mL/hr over 60 Minutes Intravenous Every 8 hours 08/16/14 1331 08/16/14 1348        Assessment/Plan Acute calculous cholecystitis with surround phlegmon s/p cholecystostomy (Dr. Barbie Banner) 08/17/14 -On IV abxs - Cipro/Flagyl 2/23, Zosyn started 08/17/14 -Improving, white count normalized Choledocholithiasis on CT-LFTs normalizing -T. Bili - 1.0 - 08/22/2014 -Dr. Deatra Ina no longer planning ERCP since LFT's are normalized, unless repeat imaging is positive for CBD stones ([place] normal) COPD with chronic bronchitis -Pulmonary following Essential hypertension Hx of Diastolic CHF DVT prophylaxis: Heparin/SCD Hyperlipidemia Body mass index - 35.66  Disp - per medical service when pulm status improved (patient says he's at baseline), maybe d/c tomorrow if GI not planning a ERCP.  Advance diet if no ERCP.  Will need to f/u with Dr. Lucia Gaskins in 2-3 weeks.    LOS: 7 days    Coralie Keens 08/23/2014, 7:28 AM Pager: [phone]

## 2014-08-24 ENCOUNTER — Other Ambulatory Visit: Payer: 59

## 2014-08-24 ENCOUNTER — Inpatient Hospital Stay (HOSPITAL_COMMUNITY): Payer: Medicare Other

## 2014-08-24 DIAGNOSIS — K8050 Calculus of bile duct without cholangitis or cholecystitis without obstruction: Secondary | ICD-10-CM | POA: Insufficient documentation

## 2014-08-24 DIAGNOSIS — R188 Other ascites: Secondary | ICD-10-CM

## 2014-08-24 LAB — COMPREHENSIVE METABOLIC PANEL
ALBUMIN: 2.3 g/dL — AB (ref 3.5–5.2)
ALT: 20 U/L (ref 0–53)
AST: 17 U/L (ref 0–37)
Alkaline Phosphatase: 65 U/L (ref 39–117)
Anion gap: 5 (ref 5–15)
BUN: 30 mg/dL — ABNORMAL HIGH (ref 6–23)
CO2: 24 mmol/L (ref 19–32)
CREATININE: 1.34 mg/dL (ref 0.50–1.35)
Calcium: 8.1 mg/dL — ABNORMAL LOW (ref 8.4–10.5)
Chloride: 111 mmol/L (ref 96–112)
GFR calc Af Amer: 59 mL/min — ABNORMAL LOW (ref >90)
GFR, EST NON AFRICAN AMERICAN: 51 mL/min — AB (ref >90)
Glucose, Bld: 81 mg/dL (ref 70–99)
Potassium: 3.8 mmol/L (ref 3.5–5.1)
Sodium: 140 mmol/L (ref 135–145)
Total Bilirubin: 1.1 mg/dL (ref 0.3–1.2)
Total Protein: 5 g/dL — ABNORMAL LOW (ref 6.0–8.3)

## 2014-08-24 LAB — CBC
HCT: 37.4 % — ABNORMAL LOW (ref 39.0–52.0)
HEMOGLOBIN: 12 g/dL — AB (ref 13.0–17.0)
MCH: 27.3 pg (ref 26.0–34.0)
MCHC: 32.1 g/dL (ref 30.0–36.0)
MCV: 85.2 fL (ref 78.0–100.0)
Platelets: 226 10*3/uL (ref 150–400)
RBC: 4.39 MIL/uL (ref 4.22–5.81)
RDW: 14.2 % (ref 11.5–15.5)
WBC: 9 10*3/uL (ref 4.0–10.5)

## 2014-08-24 LAB — PROTIME-INR
INR: 1.14 (ref 0.00–1.49)
Prothrombin Time: 14.7 seconds (ref 11.6–15.2)

## 2014-08-24 MED ORDER — MIDAZOLAM HCL 2 MG/2ML IJ SOLN
INTRAMUSCULAR | Status: AC | PRN
  Administered 2014-08-24: 1 mg via INTRAVENOUS

## 2014-08-24 MED ORDER — FENTANYL CITRATE 0.05 MG/ML IJ SOLN
INTRAMUSCULAR | Status: AC | PRN
  Administered 2014-08-24: 50 ug via INTRAVENOUS

## 2014-08-24 MED ORDER — FENTANYL CITRATE 0.05 MG/ML IJ SOLN
INTRAMUSCULAR | Status: AC
  Filled 2014-08-24: qty 4

## 2014-08-24 MED ORDER — FLUMAZENIL 0.5 MG/5ML IV SOLN
INTRAVENOUS | Status: AC
  Filled 2014-08-24: qty 5

## 2014-08-24 MED ORDER — MIDAZOLAM HCL 2 MG/2ML IJ SOLN
INTRAMUSCULAR | Status: AC
  Filled 2014-08-24: qty 6

## 2014-08-24 MED ORDER — NALOXONE HCL 0.4 MG/ML IJ SOLN
INTRAMUSCULAR | Status: AC
  Filled 2014-08-24: qty 1

## 2014-08-24 NOTE — Progress Notes (Signed)
Patient ID: Travis Ferguson, male   DOB: 1940/09/08, 74 y.o.   MRN: [id number]    Referring Physician(s): CCS  Subjective:  Pt doing fairly well; still has back pain(chronic); denies sig abd pain, N/V today; appetite diminished, occ loose stools  Allergies: Statins   Medications: Prior to Admission medications   Medication Sig Start Date End Date Taking? Authorizing Provider  albuterol (PROVENTIL) (2.5 MG/3ML) 0.083% nebulizer solution Take 2.5 mg by nebulization every 6 (six) hours as needed for wheezing or shortness of breath.   Yes Historical Provider, MD  budesonide-formoterol (SYMBICORT) 160-4.5 MCG/ACT inhaler Inhale 2 puffs into the lungs 2 (two) times daily. 05/11/12  Yes Tammy S Parrett, NP  finasteride (PROSCAR) 5 MG tablet Take 1 tablet by mouth every evening.  01/07/13  Yes Historical Provider, MD  FLUoxetine (PROZAC) 40 MG capsule Take 40 mg by mouth daily with breakfast.   cc's bile  Yes Historical Provider, MD  gabapentin (NEURONTIN) 300 MG capsule Take 900 mg by mouth daily as needed (for nerve pain).    Yes Historical Provider, MD  HYDROcodone-acetaminophen (NORCO/VICODIN) 5-325 MG per tablet Take 2 tablets by mouth 2 (two) times daily as needed (for pain).    Yes Historical Provider, MD  losartan-hydrochlorothiazide (HYZAAR) 50-12.5 MG per tablet Take 0.5 tablets by mouth daily with breakfast.  03/23/12  Yes Historical Provider, MD  metoprolol tartrate (LOPRESSOR) 25 MG tablet Take 25 mg by mouth daily with breakfast.  04/06/12  Yes Historical Provider, MD  PROAIR HFA 108 (90 BASE) MCG/ACT inhaler 1-2 PUFFS EVERY 4-6 HOURS AS NEEDED Patient taking differently: Inhale 1-2 puffs every 4 to 6 hours as needed for shortness of breath 02/21/14  Yes Chesley Mires, MD  simvastatin (ZOCOR) 20 MG tablet Take 1 tablet by mouth every evening.  04/10/14  Yes Historical Provider, MD  tiotropium (SPIRIVA) 18 MCG inhalation capsule Place 1 capsule (18 mcg total) into inhaler and inhale daily.  01/28/12  Yes Chesley Mires, MD     Vital Signs: BP 136/87 mmHg  Pulse 99  Temp(Src) 97.4 F (36.3 C) (Oral)  Resp 18  Ht 6' (1.829 m)  Wt 263 lb (119.296 kg)  BMI 35.66 kg/m2  SpO2 95%  Physical Exam pt awake/alert; chest- few scatt wheezes; heart- reg rate, occ ectopy; abd- soft, +BS, ,NT, GB drain intact, output 90 cc's bile; cx's- mult org  Imaging: Mr Abdomen W Wo Contrast  08/24/2014   CLINICAL DATA:  Nausea and vomiting. Cholelithiasis and concern for common bile duct stones.  EXAM: MRI ABDOMEN WITHOUT AND WITH CONTRAST  TECHNIQUE: Multiplanar multisequence MR imaging of the abdomen was performed both before and after the administration of intravenous contrast.  CONTRAST:  79mL MULTIHANCE GADOBENATE DIMEGLUMINE 529 MG/ML IV SOLN  COMPARISON:  CT 08/16/2014  FINDINGS: Exam is degraded by patient respiratory motion.  Lower chest:  There are moderate bilateral pleural effusions.  Hepatobiliary: There is no intrahepatic biliary duct dilatation. No enhancing hepatic lesion. While the exam is motion degraded, the common hepatic duct and common bile duct appear normal caliber. No clear filling defects within the distal common bile duct. 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 Gas within this diverticulum does limit evaluation of the most distal common bile duct (image 27, series 5).  The gallbladder wall is thickened B and hyperemic. There is a fluid collection adjacent to the fundus of the gallbladder measuring 5.4 x 4.2 cm. Findings suggest obtained of perforation of the gallbladder fundus. Individual gallstones are difficult to  appreciate within the lumen of the gallbladder. The percutaneous cholecystostomy tube is not well appreciated.  Pancreas: Periampullary duodenum diverticulum measures 1.8 cm. Pancreatic duct is prominent but not significantly dilated measuring to 3 mm. No pancreatic mass.  Spleen: Normal spleen.  Adrenals/urinary tract: Adrenal glands and kidneys are normal. There is  a nonenhancing cysts within the left kidney measuring 17 mm.  Stomach/Bowel: Limited view of the stomach and small bowel are unremarkable.  Vascular/Lymphatic: Abdominal aorta is normal caliber. No lymphadenopathy.  Musculoskeletal: No aggressive osseous lesion.  IMPRESSION: 1. Exam is degraded by patient respiratory motion. 2. No biliary duct dilatation. The common hepatic duct and common bile duct are normal caliber. No clear evidence of filling defect within the common bile duct. 3. Most distal common bile duct is difficult to assess due to gas with periampullary duodenum diverticulum. 4. Acute cholecystitis. Cholecystostomy tube is not well appreciated by MRI. Stable fluid collection at the fundus of the gallbladder is most suggestive a contained perforation. 5. No evidence acute pancreatitis. 1.   Electronically Signed   By: Suzy Bouchard M.D.   On: 08/24/2014 09:03   US Abdomen Complete  08/23/2014   CLINICAL DATA:  Choledocholithiasis.  Cholecystostomy 08/17/2014.  EXAM: ULTRASOUND ABDOMEN COMPLETE  COMPARISON:  08/17/2014.  CT 08/16/2014.  FINDINGS: Gallbladder: Gallbladder decompressed with multiple stones. Gallbladder wall thickening 4.9 mm.  Common bile duct: Unable to visualize due to the limited windows and bowel gas.  Liver: Normal were visualized. Limited windows due to overlying dressing.  IVC: No abnormality visualized.  Pancreas: Non visualization due to bowel gas.  Spleen: Size and appearance within normal limits.  Right Kidney: Length: 12.8 cm. Echogenicity within normal limits. No mass or hydronephrosis visualized.  Left Kidney: Length: 12.7 cm. Echogenicity within normal limits. No mass or hydronephrosis visualized.  Abdominal aorta: Limited visualization due to bowel gas.  Other findings: Limited exam due to overlying bandaging and bowel gas.  IMPRESSION: 1. Decompressed gallbladder with multiple stones. Gallbladder wall thickening. No pericholecystic fluid collections. Common bile duct  not visualized. 2. Limited exam due to overlying bandaging and bowel gas.   Electronically Signed   By: Marcello Moores  Register   On: 08/23/2014 07:15   Mr 3d Recon At Scanner  08/24/2014   CLINICAL DATA:  Nausea and vomiting. Cholelithiasis and concern for common bile duct stones.  EXAM: MRI ABDOMEN WITHOUT AND WITH CONTRAST  TECHNIQUE: Multiplanar multisequence MR imaging of the abdomen was performed both before and after the administration of intravenous contrast.  CONTRAST:  24mL MULTIHANCE GADOBENATE DIMEGLUMINE 529 MG/ML IV SOLN  COMPARISON:  CT 08/16/2014  FINDINGS: Exam is degraded by patient respiratory motion.  Lower chest:  There are moderate bilateral pleural effusions.  Hepatobiliary: There is no intrahepatic biliary duct dilatation. No enhancing hepatic lesion. While the exam is motion degraded, the common hepatic duct and common bile duct appear normal caliber. No clear filling defects within the distal common bile duct. 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 Gas within this diverticulum does limit evaluation of the most distal common bile duct (image 27, series 5).  The gallbladder wall is thickened B and hyperemic. There is a fluid collection adjacent to the fundus of the gallbladder measuring 5.4 x 4.2 cm. Findings suggest obtained of perforation of the gallbladder fundus. Individual gallstones are difficult to appreciate within the lumen of the gallbladder. The percutaneous cholecystostomy tube is not well appreciated.  Pancreas: Periampullary duodenum diverticulum measures 1.8 cm. Pancreatic duct is prominent but not significantly  dilated measuring to 3 mm. No pancreatic mass.  Spleen: Normal spleen.  Adrenals/urinary tract: Adrenal glands and kidneys are normal. There is a nonenhancing cysts within the left kidney measuring 17 mm.  Stomach/Bowel: Limited view of the stomach and small bowel are unremarkable.  Vascular/Lymphatic: Abdominal aorta is normal caliber. No lymphadenopathy.   Musculoskeletal: No aggressive osseous lesion.  IMPRESSION: 1. Exam is degraded by patient respiratory motion. 2. No biliary duct dilatation. The common hepatic duct and common bile duct are normal caliber. No clear evidence of filling defect within the common bile duct. 3. Most distal common bile duct is difficult to assess due to gas with periampullary duodenum diverticulum. 4. Acute cholecystitis. Cholecystostomy tube is not well appreciated by MRI. Stable fluid collection at the fundus of the gallbladder is most suggestive a contained perforation. 5. No evidence acute pancreatitis. 1.   Electronically Signed   By: Suzy Bouchard M.D.   On: 08/24/2014 09:03    Labs:  CBC:  Recent Labs  08/21/14 0531 08/22/14 0512 08/23/14 0514 08/24/14 0435  WBC 5.9 7.9 7.2 9.0  HGB 11.6* 11.9* 11.8* 12.0*  HCT 37.1* 37.3* 37.8* 37.4*  PLT 298 292 258 226    COAGS: No results for input(s): INR, APTT in the last 8760 hours.  BMP:  Recent Labs  08/21/14 0531 08/22/14 0512 08/23/14 0514 08/24/14 0435  NA 137 141 139 140  K 3.9 4.4 3.9 3.8  CL 108 112 111 111  CO2 23 23 23 24   GLUCOSE 141* 118* 88 81  BUN 36* 39* 31* 30*  CALCIUM 8.3* 8.2* 8.1* 8.1*  CREATININE 1.25 1.43* 1.21 1.34  GFRNONAA 55* 47* 57* 51*  GFRAA 64* 54* 66* 59*    LIVER FUNCTION TESTS:  Recent Labs  08/20/14 0445 08/21/14 0531 08/22/14 0512 08/24/14 0435  BILITOT 0.9 1.0 1.0 1.1  AST 19 19 23 17   ALT 20 20 23 20   ALKPHOS 90 77 73 65  PROT 5.9* 5.6* 5.3* 5.0*  ALBUMIN 2.4* 2.5* 2.4* 2.3*    Assessment and Plan: S/p perc cholecystostomy 2/24; WBC 9.0; T BILI 1.1;MRI abdomen done today reveals continued fluid collection at fundus of GB ?contained perforation. Imaging studies were reviewed by Dr. Laurence Ferrari and case reviewed with [facility]CS. Will plan to perform [place] of liver/GB/surrounding fluid collection today and if sufficient fluid noted proceed with aspiration/possible drainage. Details/risks of procedure  (including but not limited to, internal bleeding, infection)  d/w pt/wife with their understanding and consent.    Signed: Autumn Messing 08/24/2014, 1:29 PM   I spent a total of 15 minutes in face to face in clinical consultation/evaluation, greater than 50% of which was counseling/coordinating care for perigallbladder fluid collection aspiration/possible drainage

## 2014-08-24 NOTE — Progress Notes (Signed)
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, GOLD 2 [FEV 60%], LLL nodule, prior Subdural hemaotma Low risk Nuclear stress 10.28.15 Repeat [facility] admit 08/16/14 c n/v/diarr Leukocytosis 20 k  Past medical history-As per Problem list Chart reviewed as below-   Consultants:   general surgery   gastroenterology  Interventional radiology  Procedures:   10 French drain 08/24/14    percutaneous cholecystotomy 2/24/ 16  Antibiotics:   Zosyn   Subjective   feels better No other issues   Tolerating some diet  No pain in abdomen however does have back pain and neck pain   Objective    Interim History:   Telemetry:  none telemetry   Objective: Filed Vitals:   08/24/14 0303 08/24/14 0534 08/24/14 0731 08/24/14 0822  BP:  130/77  136/87  Pulse:  81  99  Temp:  97.4 F (36.3 C)    TempSrc:  Oral    Resp:  18    Height:      Weight:      SpO2: 97% 96% 95%     Intake/Output Summary (Last 24 hours) at 08/24/14 0959 Last data filed at 08/24/14 0534  Gross per 24 hour  Intake    900 ml  Output   1515 ml  Net   -615 ml    Exam:  General:  EOMI NCAT Cardiovascular:  S1-S2 no murmur rub or gallop Respiratory:  Clinically clear no added sound Abdomen:  Soft nontender noted 2 drains in abdomen draining freely Skin no lower extremity edema Neuro I  Data Reviewed: Basic Metabolic Panel:  Recent Labs Lab 08/18/14 0510  08/20/14 0445 08/21/14 0531 08/22/14 0512 08/23/14 0514 08/24/14 0435  NA 142  < > 141 137 141 139 140  K 4.1  < > 3.9 3.9 4.4 3.9 3.8  CL 114*  < > 111 108 112 111 111  CO2 21  < > 23 23 23 23 24   GLUCOSE 171*  < > 163* 141* 118* 88 81  BUN 24*  < > 36* 36* 39* 31* 30*  CREATININE 1.12  < > 1.49* 1.25 1.43* 1.21 1.34  CALCIUM 8.5  < > 8.6 8.3* 8.2* 8.1* 8.1*  MG 1.8  --   --   --   --   --   --    < > = values in this interval not displayed. Liver Function Tests:  Recent Labs Lab 08/19/14 0552 08/20/14 0445 08/21/14 0531 08/22/14 0512 08/24/14 0435  AST 23 19 19 23 17   ALT 20 20 20 23 20   ALKPHOS 102 90 77 73 65  BILITOT 1.1 0.9 1.0 1.0 1.1  PROT 6.5 5.9* 5.6* 5.3* 5.0*  ALBUMIN 2.4* 2.4* 2.5* 2.4* 2.3*   No results for input(s): LIPASE, AMYLASE in the last 168 hours. No results for input(s): AMMONIA in the last 168 hours. CBC:  Recent Labs Lab 08/18/14 0510 08/19/14 0552 08/20/14 0445 08/21/14 0531 08/22/14 0512 08/23/14 0514 08/24/14 0435  WBC 15.2* 16.5* 9.3 5.9 7.9 7.2 9.0  NEUTROABS 14.2* 15.3*  --  5.1 6.9  --   --   HGB 11.5* 12.2* 11.8* 11.6* 11.9* 11.8* 12.0*  HCT 35.7* 37.7* 37.3* 37.1* 37.3* 37.8* 37.4*  MCV 85.2 85.1 85.9 86.7 86.5 86.7 85.2  PLT PLATELET CLUMPS NOTED ON SMEAR, COUNT APPEARS ADEQUATE 368 351 298 292 258 226   Cardiac  Enzymes:  Recent Labs Lab 08/19/14 1040 08/20/14 0445  TROPONINI <0.03 <0.03   BNP: Invalid input(s): POCBNP CBG: No results for input(s): GLUCAP in the last 168 hours.  Recent Results (from the past 240 hour(s))  Blood culture (routine x 2)     Status: None   Collection Time: 08/16/14 12:27 PM  Result Value Ref Range Status   Specimen Description BLOOD RIGHT HAND  Final   Special Requests   Final    BOTTLES DRAWN AEROBIC AND ANAEROBIC 5CC BOTH BOTTLES   Culture   Final    NO GROWTH 5 DAYS Performed at [facility]    Report Status 08/22/2014 FINAL  Final  Blood culture (routine x 2)     Status: None   Collection Time: 08/16/14 12:27 PM  Result Value Ref Range Status   Specimen Description BLOOD RIGHT ARM  Final   Special Requests   Final    BOTTLES DRAWN AEROBIC AND ANAEROBIC 4CC BOTH BOTTLES   Culture   Final    NO GROWTH 5 DAYS Performed at [facility]    Report Status 08/22/2014 FINAL  Final  Clostridium Difficile by PCR     Status: None   Collection Time: 08/16/14  3:11  PM  Result Value Ref Range Status   C difficile by pcr NEGATIVE NEGATIVE Final    Comment: Performed at [facility]  Stool culture     Status: None   Collection Time: 08/16/14  3:11 PM  Result Value Ref Range Status   Specimen Description STOOL  Final   Special Requests NONE  Final   Culture   Final    NO SALMONELLA, SHIGELLA, CAMPYLOBACTER, YERSINIA, OR E.COLI 0157:H7 ISOLATED Performed at [facility]    Report Status 08/20/2014 FINAL  Final  Body fluid culture     Status: None   Collection Time: 08/17/14  2:18 PM  Result Value Ref Range Status   Specimen Description GALL BLADDER  Final   Special Requests NONE  Final   Gram Stain   Final    ABUNDANT WBC PRESENT,BOTH PMN AND MONONUCLEAR NO SQUAMOUS EPITHELIAL CELLS SEEN FEW GRAM POSITIVE COCCI IN PAIRS FEW GRAM NEGATIVE RODS Performed at [facility]    Culture   Final    MULTIPLE ORGANISMS PRESENT, NONE PREDOMINANT Note: NO GROUP A STREP (S.PYOGENES) ISOLATED NO STAPHYLOCOCCUS AUREUS ISOLATED Performed at [facility]    Report Status 08/20/2014 FINAL  Final  Urine culture     Status: None   Collection Time: 08/19/14  7:09 PM  Result Value Ref Range Status   Specimen Description URINE, CLEAN CATCH  Final   Special Requests NONE  Final   Colony Count NO GROWTH Performed at [facility]   Final   Culture NO GROWTH Performed at [facility]   Final   Report Status 08/21/2014 FINAL  Final     Studies:              All Imaging reviewed and is as per above notation   Scheduled Meds: . chlorproMAZINE (THORAZINE) injection  25 mg Intramuscular Once  . finasteride  5 mg Oral QPM  . FLUoxetine  40 mg Oral Q breakfast  . heparin  5,000 Units Subcutaneous 3 times per day  . ipratropium  0.5 mg Nebulization Q4H  . levalbuterol  0.63 mg Nebulization Q4H  . metoprolol tartrate  25 mg Oral Q breakfast  . piperacillin-tazobactam (ZOSYN)  IV  3.375  g Intravenous 3 times per day    . predniSONE  40 mg Oral Q breakfast  . simvastatin  20 mg Oral QPM  . traMADol  100 mg Oral 3 times per day  . traZODone  100 mg Oral QHS   Continuous Infusions:    Assessment/Plan: 1.   Sepsis secondary to gangrenous cholecystitis status post percutaneous cholecystotomy 2/24- stable continue IV Zosyn , appreciate GI , Gen. Surgery, IR input. Potentially can transition to by mouth Augmentin. Has received a total of 9 days IV antibiotics-- would narrow or put heart stop date at 14 days and reassess clinically as an outpatient.   2.  COPD/sarcoidosis Gold stage II- continue prednisone 40 mg every morning , leave albuterol every 4 hourly. 3.  history of diastolic heart failure-continue metoprolol 25 every morning 4.  hyperlipidemia - continue Zocor 20 mg daily 5.  depression-continue trazodone 100 daily at bedtime , fluoxetine 40 daily  6.  acute renal failure- Fina = 0.13 indicating prerenal 7.  BPH-continue finasteride 5 mg daily  Code Status:  full Family Communication:  Discussed briefly her 5 Disposition Plan:  Potential discharge a.m.   Verneita Griffes, MD  [facility] Pager [phone] 08/24/2014, 9:59 AM    LOS: 8 days

## 2014-08-24 NOTE — Progress Notes (Signed)
Progress Note   Subjective  Feels okay. No significant abdominal pain.   Objective   Vital signs in last 24 hours: Temp:  [97.4 F (36.3 C)-97.5 F (36.4 C)] 97.4 F (36.3 C) (03/02 0534) Pulse Rate:  [73-99] 99 (03/02 0822) Resp:  [18] 18 (03/02 0534) BP: (130-154)/(77-87) 136/87 mmHg (03/02 0822) SpO2:  [92 %-97 %] 95 % (03/02 0731) Last BM Date: 08/21/14 General:    white male in NAD Abdomen:  Soft, nontender and nondistended. Normal bowel sounds. Thin bilious drainage in bulb Extremities:  Without edema. Neurologic:  Alert and oriented,  grossly normal neurologically. Psych:  Cooperative. Normal mood and affect.  Lab Results:  Recent Labs  08/22/14 0512 08/23/14 0514 08/24/14 0435  WBC 7.9 7.2 9.0  HGB 11.9* 11.8* 12.0*  HCT 37.3* 37.8* 37.4*  PLT 292 258 226   BMET  Recent Labs  08/22/14 0512 08/23/14 0514 08/24/14 0435  NA 141 139 140  K 4.4 3.9 3.8  CL 112 111 111  CO2 23 23 24   GLUCOSE 118* 88 81  BUN 39* 31* 30*  CREATININE 1.43* 1.21 1.34  CALCIUM 8.2* 8.1* 8.1*   LFT  Recent Labs  08/24/14 0435  PROT 5.0*  ALBUMIN 2.3*  AST 17  ALT 20  ALKPHOS 65  BILITOT 1.1   Studies/Results: Mr Abdomen W Wo Contrast  08/24/2014   CLINICAL DATA:  Nausea and vomiting. Cholelithiasis and concern for common bile duct stones.  EXAM: MRI ABDOMEN WITHOUT AND WITH CONTRAST  TECHNIQUE: Multiplanar multisequence MR imaging of the abdomen was performed both before and after the administration of intravenous contrast.  CONTRAST:  26mL MULTIHANCE GADOBENATE DIMEGLUMINE 529 MG/ML IV SOLN  COMPARISON:  CT 08/16/2014  FINDINGS: Exam is degraded by patient respiratory motion.  Lower chest:  There are moderate bilateral pleural effusions.  Hepatobiliary: There is no intrahepatic biliary duct dilatation. No enhancing hepatic lesion. While the exam is motion degraded, the common hepatic duct and common bile duct appear normal caliber. No clear filling defects within the  distal common bile duct. 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 Gas within this diverticulum does limit evaluation of the most distal common bile duct (image 27, series 5).  The gallbladder wall is thickened B and hyperemic. There is a fluid collection adjacent to the fundus of the gallbladder measuring 5.4 x 4.2 cm. Findings suggest obtained of perforation of the gallbladder fundus. Individual gallstones are difficult to appreciate within the lumen of the gallbladder. The percutaneous cholecystostomy tube is not well appreciated.  Pancreas: Periampullary duodenum diverticulum measures 1.8 cm. Pancreatic duct is prominent but not significantly dilated measuring to 3 mm. No pancreatic mass.  Spleen: Normal spleen.  Adrenals/urinary tract: Adrenal glands and kidneys are normal. There is a nonenhancing cysts within the left kidney measuring 17 mm.  Stomach/Bowel: Limited view of the stomach and small bowel are unremarkable.  Vascular/Lymphatic: Abdominal aorta is normal caliber. No lymphadenopathy.  Musculoskeletal: No aggressive osseous lesion.  IMPRESSION: 1. Exam is degraded by patient respiratory motion. 2. No biliary duct dilatation. The common hepatic duct and common bile duct are normal caliber. No clear evidence of filling defect within the common bile duct. 3. Most distal common bile duct is difficult to assess due to gas with periampullary duodenum diverticulum. 4. Acute cholecystitis. Cholecystostomy tube is not well appreciated by MRI. Stable fluid collection at the fundus of the gallbladder is most suggestive a contained perforation. 5. No evidence acute pancreatitis. 1.   Electronically  Signed   By: Suzy Bouchard M.D.   On: 08/24/2014 09:03   US Abdomen Complete  08/23/2014   CLINICAL DATA:  Choledocholithiasis.  Cholecystostomy 08/17/2014.  EXAM: ULTRASOUND ABDOMEN COMPLETE  COMPARISON:  08/17/2014.  CT 08/16/2014.  FINDINGS: Gallbladder: Gallbladder decompressed with multiple stones.  Gallbladder wall thickening 4.9 mm.  Common bile duct: Unable to visualize due to the limited windows and bowel gas.  Liver: Normal were visualized. Limited windows due to overlying dressing.  IVC: No abnormality visualized.  Pancreas: Non visualization due to bowel gas.  Spleen: Size and appearance within normal limits.  Right Kidney: Length: 12.8 cm. Echogenicity within normal limits. No mass or hydronephrosis visualized.  Left Kidney: Length: 12.7 cm. Echogenicity within normal limits. No mass or hydronephrosis visualized.  Abdominal aorta: Limited visualization due to bowel gas.  Other findings: Limited exam due to overlying bandaging and bowel gas.  IMPRESSION: 1. Decompressed gallbladder with multiple stones. Gallbladder wall thickening. No pericholecystic fluid collections. Common bile duct not visualized. 2. Limited exam due to overlying bandaging and bowel gas.   Electronically Signed   By: Marcello Moores  Register   On: 08/23/2014 07:15   Mr 3d Recon At Scanner  08/24/2014   CLINICAL DATA:  Nausea and vomiting. Cholelithiasis and concern for common bile duct stones.  EXAM: MRI ABDOMEN WITHOUT AND WITH CONTRAST  TECHNIQUE: Multiplanar multisequence MR imaging of the abdomen was performed both before and after the administration of intravenous contrast.  CONTRAST:  87mL MULTIHANCE GADOBENATE DIMEGLUMINE 529 MG/ML IV SOLN  COMPARISON:  CT 08/16/2014  FINDINGS: Exam is degraded by patient respiratory motion.  Lower chest:  There are moderate bilateral pleural effusions.  Hepatobiliary: There is no intrahepatic biliary duct dilatation. No enhancing hepatic lesion. While the exam is motion degraded, the common hepatic duct and common bile duct appear normal caliber. No clear filling defects within the distal common bile duct. 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 Gas within this diverticulum does limit evaluation of the most distal common bile duct (image 27, series 5).  The gallbladder wall is thickened B  and hyperemic. There is a fluid collection adjacent to the fundus of the gallbladder measuring 5.4 x 4.2 cm. Findings suggest obtained of perforation of the gallbladder fundus. Individual gallstones are difficult to appreciate within the lumen of the gallbladder. The percutaneous cholecystostomy tube is not well appreciated.  Pancreas: Periampullary duodenum diverticulum measures 1.8 cm. Pancreatic duct is prominent but not significantly dilated measuring to 3 mm. No pancreatic mass.  Spleen: Normal spleen.  Adrenals/urinary tract: Adrenal glands and kidneys are normal. There is a nonenhancing cysts within the left kidney measuring 17 mm.  Stomach/Bowel: Limited view of the stomach and small bowel are unremarkable.  Vascular/Lymphatic: Abdominal aorta is normal caliber. No lymphadenopathy.  Musculoskeletal: No aggressive osseous lesion.  IMPRESSION: 1. Exam is degraded by patient respiratory motion. 2. No biliary duct dilatation. The common hepatic duct and common bile duct are normal caliber. No clear evidence of filling defect within the common bile duct. 3. Most distal common bile duct is difficult to assess due to gas with periampullary duodenum diverticulum. 4. Acute cholecystitis. Cholecystostomy tube is not well appreciated by MRI. Stable fluid collection at the fundus of the gallbladder is most suggestive a contained perforation. 5. No evidence acute pancreatitis. 1.   Electronically Signed   By: Suzy Bouchard M.D.   On: 08/24/2014 09:03      Assessment / Plan:   66. 74 year old male with acute  cholecystitis, s/p cholecystostomy 2/24. On Zosyn. MRI yesterday suggests acute cholecystitis.  2. Choledocholithiasis on CT scan 08/16/14. LFTs initially elevated but then normalized. MRCP yesterday negative for CBD stones but suggests contained perforation of gallbladder with ? abscess. IR to re-evaluate. No need for ERCP.  3. COPD exacerbation / sarcoidosis    LOS: 8 days   Tye Savoy  08/24/2014,  9:18 AM   GI Attending Note  I have personally taken an interval history, reviewed the chart, and examined the patient.  I agree with the extender's note, impression and recommendations.  There is no evidence for a retained bile duct stone.  Would proceed with IR evaluation for consideration of drainage of a peri-cholecystic abscess.  Do not anticipate anoscopic intervention.  Signing off.  Sandy Salaam. Deatra Ina, MD, [facility] [phone]

## 2014-08-24 NOTE — Procedures (Signed)
Interventional Radiology Procedure Note  Procedure: Placement of 66F drain into pericholecystic fluid collection.  25 mL purulent material aspirated.  Sample sent for cx. Complications: None Recommendations: - Drain to JP bulb - Do not flush  Signed,  Criselda Peaches, MD

## 2014-08-24 NOTE — Discharge Instructions (Signed)
Cholecystostomy  The gallbladder is a pear-shaped organ that lies beneath the liver on the right side of the body. The gallbladder stores bile, a fluid that helps the body digest fats. However, sometimes bile and other fluids build up in the gallbladder because of an obstruction (for example, a gallstones). This can cause fever, pain, swelling, nausea and other serious symptoms. The procedure used to drain these fluids is called a cholecystostomy. A tube is inserted into the gallbladder. Fluid drains through the tube into a plastic bag outside the body. This procedure is usually done on people who are admitted to the hospital. The procedure is often recommended for people who cannot have gallbladder surgery right away, usually because they are too ill to make it through surgery. The cholecystostomy tube is usually temporary, until surgery can be done. RISKS AND COMPLICATIONS Although rare, complications can include:  Clogging of the tube.  Infection in or around the drain site. Antibiotics might be prescribed for the infection. Or, another tube might be inserted to drain the infected fluid.  Internal bleeding from the liver. BEFORE THE PROCEDURE   Try to quit smoking several weeks before the procedure. Smoking can slow healing.  Arrange for someone to drive you home from the hospital.  Right before your procedure, avoid all foods and liquids after midnight. This includes coffee, tea and water.  On the day of the procedure, arrive early to fill out all the paperwork. PROCEDURE You will be given a sedative to make you sleepy and a local anesthetic to numb the skin. Next, a small cut is made in the abdomen. Then a tube is threaded through the cut into the gallbladder. The procedure is usually done with ultrasound to guide the tube into the gallbladder. Once the tube is in place, the drain is secured to the skin with a stitch. The tube is then connected to a drainage bag.  AFTER THE PROCEDURE    People who have a cholecystostomy usually stay in the hospital for several days because they are so ill. You might not be able to eat for the first few days. Instead, you will be connected to an IV for fluids and nutrients.  The procedure does not cure the blockage that caused the fluid to build up in the first place. Because of this, the gallbladder will need to be removed in the future. The drain is removed at that time. HOME CARE INSTRUCTIONS  Be sure to follow your healthcare provider's instructions carefully. You may shower but avoid tub baths and swimming until your caregiver says it is OK. Eat and drink according to the directions you have been given. And be sure to make all follow-up appointments.  Call your healthcare provider if you notice new pain, redness or swelling around the wound. SEEK IMMEDIATE MEDICAL CARE IF:   There is increased abdominal pain.  Nausea or vomiting occurs.  You develop a fever.  The drainage tube comes out of the abdomen. Document Released: 09/06/2008 Document Revised: 09/02/2011 Document Reviewed: 09/06/2008 [facility] Patient Information 2015 [place], [place]. This information is not intended to replace advice given to you by your health care provider. Make sure you discuss any questions you have with your health care provider.

## 2014-08-24 NOTE — Progress Notes (Signed)
PT Cancellation Note  Patient Details Name: OSVALDO LAMPING MRN: [id number] DOB: Sep 02, 1940   Cancelled Treatment:    Reason Eval/Treat Not Completed: Patient at procedure or test/unavailable--pt leaving for radiology. Will check back another day.    Weston Anna, MPT Pager: [phone]

## 2014-08-24 NOTE — Procedures (Signed)
Pt refuses cpap for the night 

## 2014-08-24 NOTE — Progress Notes (Signed)
Acute cholecystitis with CBD sones s/p perc chole tube.  GI ordered MRCP and it shows concern for contained perforation ?abscess?.  Will have IR see if they can drain this today or what they recommend.    Coralie Keens, PA-C General Surgery [facility] [phone] 11:07am

## 2014-08-25 ENCOUNTER — Other Ambulatory Visit (INDEPENDENT_AMBULATORY_CARE_PROVIDER_SITE_OTHER): Payer: Self-pay | Admitting: *Deleted

## 2014-08-25 MED ORDER — TRAMADOL HCL 50 MG PO TABS: 100.0000 mg | ORAL_TABLET | Freq: Three times a day (TID) | ORAL | Status: DC

## 2014-08-25 MED ORDER — AMOXICILLIN-POT CLAVULANATE 875-125 MG PO TABS: 1.0000 | ORAL_TABLET | Freq: Two times a day (BID) | ORAL | Status: DC

## 2014-08-25 MED ORDER — HYDROCODONE-ACETAMINOPHEN 5-325 MG PO TABS: 2.0000 | ORAL_TABLET | Freq: Two times a day (BID) | ORAL | Status: DC | PRN

## 2014-08-25 MED ORDER — IPRATROPIUM BROMIDE 0.02 % IN SOLN: 0.5000 mg | RESPIRATORY_TRACT | Status: DC

## 2014-08-25 MED ORDER — TRAZODONE HCL 100 MG PO TABS: 100.0000 mg | ORAL_TABLET | Freq: Every day | ORAL | Status: DC

## 2014-08-25 MED ORDER — LEVALBUTEROL HCL 0.63 MG/3ML IN NEBU: 0.6300 mg | INHALATION_SOLUTION | RESPIRATORY_TRACT | Status: DC

## 2014-08-25 NOTE — Progress Notes (Signed)
SATURATION QUALIFICATIONS: (This note is used to comply with regulatory documentation for home oxygen)  Patient Saturations on Room Air at Rest = 93%  Patient Saturations on Room Air while Ambulating = 90%  Patient Saturations on 0 Liters of oxygen while Ambulating = 90%  Please briefly explain why patient needs home oxygen: Pt has COPD

## 2014-08-25 NOTE — Discharge Summary (Signed)
Physician Discharge Summary  ADI DORO DGL:[id number] DOB: 1940/11/26 DOA: 08/16/2014  PCP: Jerlyn Ly, MD  Admit date: 08/16/2014 Discharge date: 08/25/2014  Time spent: 40 minutes  Recommendations for Outpatient Follow-up:  1. Patient will need close follow-up with general surgery as an outpatient for management of cholecystotomy drains as well as possible for interval cholecystectomy --I will alert his pulmonologist Dr. Halford Chessman 2 whether he can be seen soon and get pulmonary clearance for the procedure 2. Patient to continue antibiotics as Augmentin twice a day until 09/15/14 3. Please consider addition of other blood pressure medications as an outpatient if blood pressure above 140/90 4. Changes to medications  Augmentin 875 twice a day stop date 3/24  Ipratropium nebulization every 4 when necessary  Norco 5/325 2 times a day severe pain limited quantity given  Tramadol 100 milligrams moderate pain limited quantity given  I've discontinued losartan HCTZ combo given renal insufficiency   Discharge Diagnoses:  Principal Problem:   Sepsis Active Problems:   COPD with chronic bronchitis   Essential hypertension   Hyperlipidemia   Diastolic CHF   Gangrenous cholecystitis   Hypokalemia   Cholecystitis, acute with cholelithiasis   COPD exacerbation   Calculus of gallbladder with acute cholecystitis and obstruction   Choledocholithiasis   Acute on chronic respiratory failure   OSA (obstructive sleep apnea)   Preop respiratory exam   Acute kidney injury   Sarcoid   Wheezing   Other emphysema   Abdominal fluid collection   Common bile duct stone   Discharge Condition:  Stable  Diet recommendation:  Regular  Filed Weights   08/16/14 1500  Weight: 119.296 kg (263 lb)    History of present illness:  74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  Sepsis secondary to gangrenous cholecystitis status post percutaneous cholecystotomy 2/24- stable he was on 10 days of IV Zosyn which was narrowed under guidance of Dr. Johnnye Sima of infectious disease to Augmentin for 3 weeks ending 09/15/14 , appreciate GI , Gen. Surgery, IR input.   Point forward he will probably need the drain managed by home health and will then likely need an interval cholecystectomy per discretion of general surgery as an outpatient. I will forward this note to his pulmonologist for him to be aware 2. COPD/sarcoidosis Gold stage II-patient initially was on IV Solu-Medrol from 2/24 until 2/29 and then placed on by mouth prednisone which I discontinued on day of discharge 3/3. leave albuterol every 4 hourly. He will start tiotropium nebulizations at home as well, continue Symbicort twice a day alert pleasant oriented mild discomfort in the abdomen however not unbearable. He should follow-up with his pulmonologist. He did not have an oxygen requirement on discharge 3. history of diastolic heart failure-continue metoprolol 25 every morning--consider more beta selective agent as an outpatient as per cardiology 4. hyperlipidemia - continue Zocor 20 mg daily 5. depression-continue trazodone 100 daily at bedtime , fluoxetine 40 daily  6. acute renal failure- Fina = 0.13 indicating prerenal-this was likely secondary to his thiazide diuretic which was discontinued during this hospital stay 7. hypertension JNC 8 goal less than 140/60-continue metoprolol as above 8. BPH-continue finasteride 5 mg daily  Consultants: general surgery gastroenterology Interventional radiology  Procedures:  10 French drain 08/24/14   percutaneous cholecystotomy 2/24/ 16  Antibiotics:  Unasyn, Cipro, Flagyl 2/23  Zosyn  2/24-3/3] Augmentin 3/3 ??? 3/24  Discharge Exam: Filed Vitals:   08/25/14 0845  BP: 127/76  Pulse: 97  Temp: 97.8 F (36.6 C)  Resp: 18    General: alert pleasant oriented no apparent distress Cardiovascular:  S1-S2 no murmur rub or gallop Respiratory: clkinically clear no wheeze no added sound  Discharge Instructions   Discharge Instructions    Diet - low sodium heart healthy    Complete by:  As directed      Discharge instructions    Complete by:  As directed   You will need 3 week more of oral Augmentin You should follow up with Dr. Crist Infante in ~1 week time-your appointment will be scheduled for you Should you have problems or concerns with your drains these contact Dr. Alphonsa Overall of general surgery You will need labs done at Dr. Silvestre Mesi office in a week A few medication changes have occurred please look at your list of meds carefully We will prescribe some pain medicine for you for a short period of time and this will have to be refilled her primary care physician or by your general surgeon     Increase activity slowly    Complete by:  As directed           Current Discharge Medication List    START taking these medications   Details  amoxicillin-clavulanate (AUGMENTIN) 875-125 MG per tablet Take 1 tablet by mouth 2 (two) times daily. Qty: 42 tablet, Refills: 0    ipratropium (ATROVENT) 0.02 % nebulizer solution Take 2.5 mLs (0.5 mg total) by nebulization every 4 (four) hours. Qty: 75 mL, Refills: 12    levalbuterol (XOPENEX) 0.63 MG/3ML nebulizer solution Take 3 mLs (0.63 mg total) by nebulization every 4 (four) hours. Qty: 3 mL, Refills: 12    traMADol (ULTRAM) 50 MG tablet Take 2 tablets (100 mg total) by mouth every 8 (eight) hours. Qty: 30 tablet, Refills: 0    traZODone (DESYREL) 100 MG tablet Take 1 tablet (100 mg total) by mouth at bedtime. Qty: 15 tablet, Refills: 0      CONTINUE these medications which have CHANGED   Details   HYDROcodone-acetaminophen (NORCO/VICODIN) 5-325 MG per tablet Take 2 tablets by mouth 2 (two) times daily as needed (for pain). Qty: 30 tablet, Refills: 0      CONTINUE these medications which have NOT CHANGED   Details  albuterol (PROVENTIL) (2.5 MG/3ML) 0.083% nebulizer solution Take 2.5 mg by nebulization every 6 (six) hours as needed for wheezing or shortness of breath.    budesonide-formoterol (SYMBICORT) 160-4.5 MCG/ACT inhaler Inhale 2 puffs into the lungs 2 (two) times daily. Qty: 2 Inhaler, Refills: 0    finasteride (PROSCAR) 5 MG tablet Take 1 tablet by mouth every evening.     FLUoxetine (PROZAC) 40 MG capsule Take 40 mg by mouth daily with breakfast.     gabapentin (NEURONTIN) 300 MG capsule Take 900 mg by mouth daily as needed (for nerve pain).     metoprolol tartrate (LOPRESSOR) 25 MG tablet Take 25 mg by mouth daily with breakfast.     PROAIR HFA 108 (90 BASE) MCG/ACT inhaler 1-2 PUFFS EVERY 4-6 HOURS AS NEEDED Qty: 8.5 each, Refills: 1    simvastatin (ZOCOR) 20 MG tablet Take 1 tablet by mouth every evening.     tiotropium (SPIRIVA) 18 MCG inhalation capsule Place 1 capsule (  18 mcg total) into inhaler and inhale daily. Qty: 90 capsule, Refills: 3      STOP taking these medications     losartan-hydrochlorothiazide (HYZAAR) 50-12.5 MG per tablet        Allergies  Allergen Reactions  . Statins Other (See Comments)    Muscle aches - tolerating Vytorin   Follow-up Information    Follow up with [facility], MD. Schedule an appointment as soon as possible for a visit on 09/14/2014.   Specialty:  General Surgery   Why:  For post-hospital follow up at 8:15am, but please arrive by 8:00am to check in and fill out paperwork.   Contact information:   [address] [place] [address] [phone]       Follow up with PERINI,MARK A, MD. Schedule an appointment as soon as possible for a visit in 7 days.   Specialty:  Internal Medicine   Contact  information:   [address] [place] [address] [phone]       Follow up On 08/25/2014.       The results of significant diagnostics from this hospitalization (including imaging, microbiology, ancillary and laboratory) are listed below for reference.    Significant Diagnostic Studies: Ct Abdomen Pelvis Wo Contrast  08/16/2014   CLINICAL DATA:  Two week history of diarrhea, nausea, and vomiting. Symptoms began shortly after recent colonoscopy. History of COPD, hypertension, and hernia.  EXAM: CT ABDOMEN AND PELVIS WITHOUT CONTRAST  TECHNIQUE: Multidetector CT imaging of the abdomen and pelvis was performed following the standard protocol without IV contrast.  COMPARISON:  03/30/2013  FINDINGS: Evaluation of lungs is limited due to motion artifact. There appears to be prominence of the pulmonary vascularity suggesting pulmonary vascular congestion. Coronary artery calcifications. Multiple calcified lymph nodes in the mediastinum and hila bilaterally.  Multiple stones are present in the gallbladder. There is inflammatory infiltration around the gallbladder with small amount of gas in the gallbladder wall. There is an eccentric fluid collection suggesting hematoma or abscess adjacent to the anterior gallbladder wall. Enlarged gallbladder displaces the hepatic flexure of the colon medially. Edema in the adjacent liver. Stones are present in the common bile duct. No significant ductal dilatation. Appearance is likely due to acute gangrenous cholecystitis.  Unenhanced appearance of the liver, spleen, pancreas, adrenal glands, kidneys, inferior vena cava, and retroperitoneal lymph nodes is unremarkable. Calcification of the abdominal aorta without aneurysm. Stomach and small bowel are normal. No wall thickening or abnormal distention. Contrast material flows through to the colon without evidence of obstruction. Colon is diffusely contrast filled. There is inflammatory thickening of the hepatic flexure  related to be adjacent gallbladder process appear otherwise, no focal colonic wall thickening. No colonic distention. Contrast material flows through to the rectum. Scattered diverticula in the colon. No free air or free fluid in the abdomen.  Pelvis: Prostate gland is not enlarged. Vascular calcifications are present. Bladder wall is not thickened. Fat demonstrated in the inguinal canals. No pelvic mass or lymphadenopathy. No free or loculated pelvic fluid collections. The appendix is normal. Degenerative changes in the lumbar spine. No destructive bone lesions.  IMPRESSION: Severe inflammatory changes involving the gallbladder and pericholecystic tissues with gas in the gallbladder wall, membranous changes versus abscess, and surrounding inflammatory reaction involving adjacent colon and liver. Multiple stones in the gallbladder. Stones in the common bile duct. Changes are consistent with gangrenous cholecystitis. Surgical consultation is recommended.   Electronically Signed   By: Oren Beckmann.D.  On: 08/16/2014 17:28   Ct Chest Wo Contrast  08/19/2014   CLINICAL DATA:  Progressive cough or shortness of breath since admission hospital 1 week ago. Sarcoidosis. COPD. Healthcare associated pneumonia. Gastroesophageal reflux disease.  EXAM: CT CHEST WITHOUT CONTRAST  TECHNIQUE: Multidetector CT imaging of the chest was performed following the standard protocol without IV contrast.  COMPARISON:  Plain film 08/19/2014.  CT 03/16/2014.  FINDINGS: Mediastinum/Nodes: Aortic and branch vessel atherosclerosis. Mild cardiomegaly with multivessel coronary artery atherosclerosis.  Pulmonary arteries are enlarged, with the outflow tract measuring 3.4 cm. Similar appearance of enlarged mediastinal and hilar nodes, calcified and most consistent with sarcoidosis.  Lungs/Pleura: Small bilateral pleural effusions. The left pleural effusion is similar. The right pleural effusion is new since the prior of 03/16/2014.   Presumed secretions within the non dependent trachea on image 13, new. Lower lobe predominant bronchial wall thickening is moderate to marked. Mild centrilobular emphysema.  Bilateral peribronchovascular interstitial thickening and nodularity. Appear slightly progressive, including within the right upper lobe on image 24 and laterally within the left lower lobe on image 32. Scattered small pulmonary nodules which are felt to be similar in distribution.  No superimposed airspace consolidation.  Upper abdomen: Normal imaged portions of the liver, spleen, stomach, pancreas, adrenal glands, right kidney. Cholecystostomy with stones and/or contrast identified within the gallbladder. Pericholecystic edema has improved since 08/16/2014.  Musculoskeletal: No acute osseous abnormality. Moderate thoracic spondylosis.  IMPRESSION: 1. Pulmonary parenchymal findings which are most consistent with sarcoidosis. This may be slightly progressive, or accentuated by diminished lung volumes on the current exam. 2. No evidence of superimposed pneumonia. 3. Small bilateral pleural effusions. 4. Calcified thoracic adenopathy, also consistent with sarcoidosis. This is unchanged. 5.  Atherosclerosis, including within the coronary arteries. 6. Pulmonary artery enlargement suggests pulmonary arterial hypertension. 7. Cholecystostomy tube in place with improved pericholecystic inflammation since 08/16/2014.   Electronically Signed   By: Abigail Miyamoto M.D.   On: 08/19/2014 16:50   Mr Abdomen W Wo Contrast  08/24/2014   CLINICAL DATA:  Nausea and vomiting. Cholelithiasis and concern for common bile duct stones.  EXAM: MRI ABDOMEN WITHOUT AND WITH CONTRAST  TECHNIQUE: Multiplanar multisequence MR imaging of the abdomen was performed both before and after the administration of intravenous contrast.  CONTRAST:  64mL MULTIHANCE GADOBENATE DIMEGLUMINE 529 MG/ML IV SOLN  COMPARISON:  CT 08/16/2014  FINDINGS: Exam is degraded by patient respiratory  motion.  Lower chest:  There are moderate bilateral pleural effusions.  Hepatobiliary: There is no intrahepatic biliary duct dilatation. No enhancing hepatic lesion. While the exam is motion degraded, the common hepatic duct and common bile duct appear normal caliber. No clear filling defects within the distal common bile duct. 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 Gas within this diverticulum does limit evaluation of the most distal common bile duct (image 27, series 5).  The gallbladder wall is thickened B and hyperemic. There is a fluid collection adjacent to the fundus of the gallbladder measuring 5.4 x 4.2 cm. Findings suggest obtained of perforation of the gallbladder fundus. Individual gallstones are difficult to appreciate within the lumen of the gallbladder. The percutaneous cholecystostomy tube is not well appreciated.  Pancreas: Periampullary duodenum diverticulum measures 1.8 cm. Pancreatic duct is prominent but not significantly dilated measuring to 3 mm. No pancreatic mass.  Spleen: Normal spleen.  Adrenals/urinary tract: Adrenal glands and kidneys are normal. There is a nonenhancing cysts within the left kidney measuring 17 mm.  Stomach/Bowel: Limited view of the stomach and  small bowel are unremarkable.  Vascular/Lymphatic: Abdominal aorta is normal caliber. No lymphadenopathy.  Musculoskeletal: No aggressive osseous lesion.  IMPRESSION: 1. Exam is degraded by patient respiratory motion. 2. No biliary duct dilatation. The common hepatic duct and common bile duct are normal caliber. No clear evidence of filling defect within the common bile duct. 3. Most distal common bile duct is difficult to assess due to gas with periampullary duodenum diverticulum. 4. Acute cholecystitis. Cholecystostomy tube is not well appreciated by MRI. Stable fluid collection at the fundus of the gallbladder is most suggestive a contained perforation. 5. No evidence acute pancreatitis. 1.   Electronically Signed    By: Suzy Bouchard M.D.   On: 08/24/2014 09:03   [place] Abscess Drain  08/24/2014   CLINICAL DATA:  74 year old male with recent diagnosis of acute calculus cholecystitis. He is currently a poor surgical candidate. Percutaneous cholecystostomy tube placement was performed on 08/17/2014. On recent MRCP to evaluate for choledocholithiasis it was noted that there was a complex fluid collection surrounding the gallbladder fundus consistent for pericholecystic abscess. He presents for ultrasound-guided aspiration and possible drain placement.  EXAM: ULTRASOUND GUIDED ABSCESS DRAINAGE  Date: 08/24/2014  PROCEDURE: 1. Ultrasound-guided placement of a 10 French drain in the pericholecystic fluid collection Interventional Radiologist:  Criselda Peaches, MD  ANESTHESIA/SEDATION: Moderate (conscious) sedation was used. 1 mg Versed, 50 mcg Fentanyl were administered intravenously. The patient's vital signs were monitored continuously by radiology nursing throughout the procedure.  Sedation Time: 16 minutes  MEDICATIONS: None additional  TECHNIQUE: Informed consent was obtained from the patient following explanation of the procedure, risks, benefits and alternatives. The patient understands, agrees and consents for the procedure. All questions were addressed. A time out was performed.  Maximal barrier sterile technique utilized including caps, mask, sterile gowns, sterile gloves, large sterile drape, hand hygiene, and Betadine skin prep.  The right upper quadrant was interrogated with ultrasound. The complex fluid collection surrounding the gallbladder fundus was successfully identified. This measures up to 5 cm in diameter. A suitable skin entry site was selected and marked. The region was then sterilely prepped and draped in standard fashion. Local anesthesia was attained by infiltration with 1% lidocaine. A small dermatotomy was made. Under real-time sonographic guidance, an 18 gauge trocar needle was advanced into the fluid  collection. Aspiration yielded frankly purulent material. The decision was therefore made to proceed with drain placement.  A short 0.035 inch Amplatz wire was coiled within the fluid collection. The skin tract was dilated to 10 [place] and a Cook 10.2 [place] all-purpose drainage catheter was advanced over the wire and coiled within the fluid collection. The locking pigtail loop was formed. Aspiration yielded approximately 25 mL of frankly purulent material. This was sent for Gram stain and culture.  Ultrasound evaluation demonstrates significantly decreased volume of the complex fluid collection. The catheter was gently flushed and connected to JP bulb suction. The catheter tubing was then secured to the skin with 0 Prolene suture and a sterile bandage was applied. The patient tolerated the procedure well.  COMPLICATIONS: None  IMPRESSION: 1. Successful placement of a 10 French percutaneous drainage catheter into the pericholecystic complex fluid collection. Aspiration yielded 25 mL frankly purulent material. A sample was sent for Gram stain and culture.  PLAN: 1. Patient followup with Dr. Vilinda Flake for possible interval cholecystectomy. 2. Drains can be followed in interventional radiology drain clinic. Consider initial follow-up in 1-2 weeks. Signed,  Criselda Peaches, MD  [facility] Interventional Radiology  [facility]  [facility] Radiology   Electronically Signed   By: Jacqulynn Cadet M.D.   On: 08/24/2014 17:31   US Abdomen Complete  08/23/2014   CLINICAL DATA:  Choledocholithiasis.  Cholecystostomy 08/17/2014.  EXAM: ULTRASOUND ABDOMEN COMPLETE  COMPARISON:  08/17/2014.  CT 08/16/2014.  FINDINGS: Gallbladder: Gallbladder decompressed with multiple stones. Gallbladder wall thickening 4.9 mm.  Common bile duct: Unable to visualize due to the limited windows and bowel gas.  Liver: Normal were visualized. Limited windows due to overlying dressing.  IVC: No abnormality visualized.  Pancreas: Non  visualization due to bowel gas.  Spleen: Size and appearance within normal limits.  Right Kidney: Length: 12.8 cm. Echogenicity within normal limits. No mass or hydronephrosis visualized.  Left Kidney: Length: 12.7 cm. Echogenicity within normal limits. No mass or hydronephrosis visualized.  Abdominal aorta: Limited visualization due to bowel gas.  Other findings: Limited exam due to overlying bandaging and bowel gas.  IMPRESSION: 1. Decompressed gallbladder with multiple stones. Gallbladder wall thickening. No pericholecystic fluid collections. Common bile duct not visualized. 2. Limited exam due to overlying bandaging and bowel gas.   Electronically Signed   By: Marcello Moores  Register   On: 08/23/2014 07:15   Mr 3d Recon At Scanner  08/24/2014   CLINICAL DATA:  Nausea and vomiting. Cholelithiasis and concern for common bile duct stones.  EXAM: MRI ABDOMEN WITHOUT AND WITH CONTRAST  TECHNIQUE: Multiplanar multisequence MR imaging of the abdomen was performed both before and after the administration of intravenous contrast.  CONTRAST:  6mL MULTIHANCE GADOBENATE DIMEGLUMINE 529 MG/ML IV SOLN  COMPARISON:  CT 08/16/2014  FINDINGS: Exam is degraded by patient respiratory motion.  Lower chest:  There are moderate bilateral pleural effusions.  Hepatobiliary: There is no intrahepatic biliary duct dilatation. No enhancing hepatic lesion. While the exam is motion degraded, the common hepatic duct and common bile duct appear normal caliber. No clear filling defects within the distal common bile duct. 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 Gas within this diverticulum does limit evaluation of the most distal common bile duct (image 27, series 5).  The gallbladder wall is thickened B and hyperemic. There is a fluid collection adjacent to the fundus of the gallbladder measuring 5.4 x 4.2 cm. Findings suggest obtained of perforation of the gallbladder fundus. Individual gallstones are difficult to appreciate within the  lumen of the gallbladder. The percutaneous cholecystostomy tube is not well appreciated.  Pancreas: Periampullary duodenum diverticulum measures 1.8 cm. Pancreatic duct is prominent but not significantly dilated measuring to 3 mm. No pancreatic mass.  Spleen: Normal spleen.  Adrenals/urinary tract: Adrenal glands and kidneys are normal. There is a nonenhancing cysts within the left kidney measuring 17 mm.  Stomach/Bowel: Limited view of the stomach and small bowel are unremarkable.  Vascular/Lymphatic: Abdominal aorta is normal caliber. No lymphadenopathy.  Musculoskeletal: No aggressive osseous lesion.  IMPRESSION: 1. Exam is degraded by patient respiratory motion. 2. No biliary duct dilatation. The common hepatic duct and common bile duct are normal caliber. No clear evidence of filling defect within the common bile duct. 3. Most distal common bile duct is difficult to assess due to gas with periampullary duodenum diverticulum. 4. Acute cholecystitis. Cholecystostomy tube is not well appreciated by MRI. Stable fluid collection at the fundus of the gallbladder is most suggestive a contained perforation. 5. No evidence acute pancreatitis. 1.   Electronically Signed   By: Suzy Bouchard M.D.   On: 08/24/2014 09:03   Ir Perc Cholecystostomy  08/17/2014   CLINICAL  DATA:  Acute cholecystitis  EXAM: CHOLECYSTOSTOMY  FLUOROSCOPY TIME:  1 minutes and 6 seconds.  MEDICATIONS AND MEDICAL HISTORY: Versed 0 mg, Fentanyl 50 mcg.  Additional Medications: Zosyn 3.375 g  ANESTHESIA/SEDATION: Moderate sedation time: 14 minutes  CONTRAST:  5 cc Omnipaque 300  PROCEDURE: The procedure, risks, benefits, and alternatives were explained to the patient. Questions regarding the procedure were encouraged and answered. The patient understands and consents to the procedure.  The right upper quadrant was prepped with Betadine in a sterile fashion, and a sterile drape was applied covering the operative field. A sterile gown and sterile  gloves were used for the procedure.  A 21 gauge needle was inserted into the gallbladder lumen under sonographic guidance and via transhepatic approach. It was removed over a 018 wire, which was upsized to a 3-J. A 10-French drain was advanced over the wire and coiled in the gallbladder lumen. It was sewn to the skin after being string fixed.  FINDINGS: The cystic duct is occluded.  COMPLICATIONS: None  IMPRESSION: Successful cholecystostomy. This needs to remain in place at least 6 weeks.   Electronically Signed   By: Marybelle Killings M.D.   On: 08/17/2014 16:19   Dg Chest Port 1 View  08/19/2014   CLINICAL DATA:  Wheezing  EXAM: PORTABLE CHEST - 1 VIEW  COMPARISON:  August 17, 2014  FINDINGS: There is patchy atelectasis in each lung base, slightly more on the right than on the left, stable. There is no frank edema or consolidation. Heart is upper normal in size with pulmonary vascularity within normal limits. No adenopathy. No bone lesions.  IMPRESSION: Bibasilar atelectasis. No frank edema or consolidation. No appreciable change compared to recent prior study.   Electronically Signed   By: Lowella Grip III M.D.   On: 08/19/2014 10:11   Dg Chest Port 1 View  08/17/2014   CLINICAL DATA:  Dyspnea. Labored breathing sounds. 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 Borderline heart size and pulmonary vascularity are likely normal for technique. Diffuse interstitial pattern in the lungs possibly a chronic bronchitic changes. Linear atelectasis in the right lung base. No blunting of costophrenic angles. No apparent pneumothorax. Degenerative changes in the spine. Similar appearance to previous study.  IMPRESSION: Shallow inspiration with atelectasis in the lung bases. Probable chronic bronchitic changes.   Electronically Signed   By: Lucienne Capers M.D.   On: 08/17/2014 23:28   Dg Chest Port 1 View  08/17/2014    CLINICAL DATA:  Hypertension, COPD.  Shortness of Breath.  EXAM: PORTABLE CHEST - 1 VIEW  COMPARISON:  08/16/2014  FINDINGS: Mild elevation of the right hemidiaphragm, stable. Right base atelectasis. Heart is borderline in size. Mild vascular congestion without overt edema. No effusions. No acute bony abnormality.  IMPRESSION: Borderline heart size, vascular congestion.  Right base atelectasis.   Electronically Signed   By: Rolm Baptise M.D.   On: 08/17/2014 14:24   Dg Chest Port 1 View  08/16/2014   CLINICAL DATA:  Shortness of Breath  EXAM: PORTABLE CHEST - 1 VIEW  COMPARISON:  04/07/2014  FINDINGS: Cardiomediastinal silhouette is stable. Persistent streaky atelectasis or scarring left base. No segmental infiltrate or convincing pulmonary edema .  IMPRESSION: Persistent streaky atelectasis or scarring left base. No infiltrate or pulmonary edema.   Electronically Signed   By: Lahoma Crocker M.D.   On: 08/16/2014 10:54   Dg Knee  Ap/lat W/sunrise Right  07/28/2014   CLINICAL DATA:  Persistent low back pain radiating into right knee sixth lumbar spine surgery ulna 11 months ago. ; increased burning sensation over the right anterior knee for the past week ; tenderness to palpation over the patella.  EXAM: RIGHT KNEE 3 VIEWS  COMPARISON:  None.  FINDINGS: The bones of the right knee are adequately mineralized. There is no acute or healing fracture. The joint spaces are preserved. There is mild beaking of the tibial spines. There are small spurs from the superior and inferior margins of the articular surface of the patella. There is no joint effusion. There is popliteal artery calcification.  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 There is no acute bony abnormality. No joint effusion is demonstrated.   Electronically Signed   By: David  Martinique   On: 07/28/2014 09:17    Microbiology: Recent Results (from the past 240 hour(s))  Blood culture (routine x 2)     Status: None   Collection  Time: 08/16/14 12:27 PM  Result Value Ref Range Status   Specimen Description BLOOD RIGHT HAND  Final   Special Requests   Final    BOTTLES DRAWN AEROBIC AND ANAEROBIC 5CC BOTH BOTTLES   Culture   Final    NO GROWTH 5 DAYS Performed at [facility]    Report Status 08/22/2014 FINAL  Final  Blood culture (routine x 2)     Status: None   Collection Time: 08/16/14 12:27 PM  Result Value Ref Range Status   Specimen Description BLOOD RIGHT ARM  Final   Special Requests   Final    BOTTLES DRAWN AEROBIC AND ANAEROBIC 4CC BOTH BOTTLES   Culture   Final    NO GROWTH 5 DAYS Performed at [facility]    Report Status 08/22/2014 FINAL  Final  Clostridium Difficile by PCR     Status: None   Collection Time: 08/16/14  3:11 PM  Result Value Ref Range Status   C difficile by pcr NEGATIVE NEGATIVE Final    Comment: Performed at [facility]  Stool culture     Status: None   Collection Time: 08/16/14  3:11 PM  Result Value Ref Range Status   Specimen Description STOOL  Final   Special Requests NONE  Final   Culture   Final    NO SALMONELLA, SHIGELLA, CAMPYLOBACTER, YERSINIA, OR E.COLI 0157:H7 ISOLATED Performed at [facility]    Report Status 08/20/2014 FINAL  Final  Body fluid culture     Status: None   Collection Time: 08/17/14  2:18 PM  Result Value Ref Range Status   Specimen Description GALL BLADDER  Final   Special Requests NONE  Final   Gram Stain   Final    ABUNDANT WBC PRESENT,BOTH PMN AND MONONUCLEAR NO SQUAMOUS EPITHELIAL CELLS SEEN FEW GRAM POSITIVE COCCI IN PAIRS FEW GRAM NEGATIVE RODS Performed at [facility]    Culture   Final    MULTIPLE ORGANISMS PRESENT, NONE PREDOMINANT Note: NO GROUP A STREP (S.PYOGENES) ISOLATED NO STAPHYLOCOCCUS AUREUS ISOLATED Performed at [facility]    Report Status 08/20/2014 FINAL  Final  Urine culture     Status: None   Collection Time: 08/19/14  7:09 PM  Result Value Ref Range Status    Specimen Description URINE, CLEAN CATCH  Final   Special Requests NONE  Final   Colony Count NO GROWTH Performed at [facility]  Final   Culture NO GROWTH Performed at [facility]   Final   Report Status 08/21/2014 FINAL  Final  Culture, routine-abscess     Status: None (Preliminary result)   Collection Time: 08/24/14  4:15 PM  Result Value Ref Range Status   Specimen Description GALL BLADDER  Final   Special Requests Normal  Final   Gram Stain   Final    ABUNDANT WBC PRESENT, PREDOMINANTLY PMN NO SQUAMOUS EPITHELIAL CELLS SEEN NO ORGANISMS SEEN Performed at [facility]    Culture NO GROWTH Performed at [facility]   Final   Report Status PENDING  Incomplete     Labs: Basic Metabolic Panel:  Recent Labs Lab 08/20/14 0445 08/21/14 0531 08/22/14 0512 08/23/14 0514 08/24/14 0435  NA 141 137 141 139 140  K 3.9 3.9 4.4 3.9 3.8  CL 111 108 112 111 111  CO2 23 23 23 23 24   GLUCOSE 163* 141* 118* 88 81  BUN 36* 36* 39* 31* 30*  CREATININE 1.49* 1.25 1.43* 1.21 1.34  CALCIUM 8.6 8.3* 8.2* 8.1* 8.1*   Liver Function Tests:  Recent Labs Lab 08/19/14 0552 08/20/14 0445 08/21/14 0531 08/22/14 0512 08/24/14 0435  AST 23 19 19 23 17   ALT 20 20 20 23 20   ALKPHOS 102 90 77 73 65  BILITOT 1.1 0.9 1.0 1.0 1.1  PROT 6.5 5.9* 5.6* 5.3* 5.0*  ALBUMIN 2.4* 2.4* 2.5* 2.4* 2.3*   No results for input(s): LIPASE, AMYLASE in the last 168 hours. No results for input(s): AMMONIA in the last 168 hours. CBC:  Recent Labs Lab 08/19/14 0552 08/20/14 0445 08/21/14 0531 08/22/14 0512 08/23/14 0514 08/24/14 0435  WBC 16.5* 9.3 5.9 7.9 7.2 9.0  NEUTROABS 15.3*  --  5.1 6.9  --   --   HGB 12.2* 11.8* 11.6* 11.9* 11.8* 12.0*  HCT 37.7* 37.3* 37.1* 37.3* 37.8* 37.4*  MCV 85.1 85.9 86.7 86.5 86.7 85.2  PLT 368 351 298 292 258 226   Cardiac Enzymes:  Recent Labs Lab 08/19/14 1040 08/20/14 0445  TROPONINI <0.03 <0.03   BNP: BNP  (last 3 results)  Recent Labs  08/19/14 1040  BNP 460.9*    ProBNP (last 3 results)  Recent Labs  03/17/14 1305  PROBNP 235.0*    CBG: No results for input(s): GLUCAP in the last 168 hours.     SignedNita Sells  [facility] 08/25/2014, 11:09 AM

## 2014-08-25 NOTE — Care Management Note (Signed)
    Page 1 of 2   08/25/2014     11:33:07 AM CARE MANAGEMENT NOTE 08/25/2014  Patient:  Travis Ferguson, Travis Ferguson   Account Number:  [id number]  Date Initiated:  08/17/2014  Documentation initiated by:  Sunday Spillers  Subjective/Objective Assessment:   74 yo male admitted with possible gangrenous gallbladder. PTA lived at home with spouse.     Action/Plan:   Home when stable   Anticipated DC Date:  08/25/2014   Anticipated DC Plan:  [place]  CM consult      [place]   Choice offered to / List presented to:  C-3 Spouse   DME arranged  [facility]      DME agency  [place] arranged  HH-1 RN  [place].   Status of service:  Completed, signed off Medicare Important Message given?  YES (If response is "NO", the following Medicare IM given date fields will be blank) Date Medicare IM given:  08/23/2014 Medicare IM given by:  [facility] Date Additional Medicare IM given:   Additional Medicare IM given by:    Discharge Disposition:  [place]  Per UR Regulation:  Reviewed for med. necessity/level of care/duration of stay  If discussed at [place] of Stay Meetings, dates discussed:    Comments:  08-25-14 [place] 1130 Spoke with patient and spouse at bedside. Discussed need for [facility] RN for drain care and possible O2 if he qualifies. Provided spouse a list for choice. Chose [facility], contacted Cyril Mourning to arrange. Awaiting final orders and qualifying sats.

## 2014-08-25 NOTE — Progress Notes (Signed)
Referring Physician(s): CCS  Subjective:  Perc chole drain placed 2/24 New pericholecystic abscess drain placed 08/24/14 Feeling better today Up in bed  Allergies: Statins  Medications: Prior to Admission medications   Medication Sig Start Date End Date Taking? Authorizing Provider  albuterol (PROVENTIL) (2.5 MG/3ML) 0.083% nebulizer solution Take 2.5 mg by nebulization every 6 (six) hours as needed for wheezing or shortness of breath.   Yes Historical Provider, MD  budesonide-formoterol (SYMBICORT) 160-4.5 MCG/ACT inhaler Inhale 2 puffs into the lungs 2 (two) times daily. 05/11/12  Yes Tammy S Parrett, NP  finasteride (PROSCAR) 5 MG tablet Take 1 tablet by mouth every evening.  01/07/13  Yes Historical Provider, MD  FLUoxetine (PROZAC) 40 MG capsule Take 40 mg by mouth daily with breakfast.    Yes Historical Provider, MD  gabapentin (NEURONTIN) 300 MG capsule Take 900 mg by mouth daily as needed (for nerve pain).    Yes Historical Provider, MD  HYDROcodone-acetaminophen (NORCO/VICODIN) 5-325 MG per tablet Take 2 tablets by mouth 2 (two) times daily as needed (for pain).    Yes Historical Provider, MD  losartan-hydrochlorothiazide (HYZAAR) 50-12.5 MG per tablet Take 0.5 tablets by mouth daily with breakfast.  03/23/12  Yes Historical Provider, MD  metoprolol tartrate (LOPRESSOR) 25 MG tablet Take 25 mg by mouth daily with breakfast.  04/06/12  Yes Historical Provider, MD  PROAIR HFA 108 (90 BASE) MCG/ACT inhaler 1-2 PUFFS EVERY 4-6 HOURS AS NEEDED Patient taking differently: Inhale 1-2 puffs every 4 to 6 hours as needed for shortness of breath 02/21/14  Yes Chesley Mires, MD  simvastatin (ZOCOR) 20 MG tablet Take 1 tablet by mouth every evening.  04/10/14  Yes Historical Provider, MD  tiotropium (SPIRIVA) 18 MCG inhalation capsule Place 1 capsule (18 mcg total) into inhaler and inhale daily. 01/28/12  Yes Chesley Mires, MD     Vital Signs: BP 127/76 mmHg  Pulse 97  Temp(Src) 97.8 F  (36.6 C) (Oral)  Resp 18  Ht 6' (1.829 m)  Wt 119.296 kg (263 lb)  BMI 35.66 kg/m2  SpO2 96%  Physical Exam  Abdominal: Soft. Bowel sounds are normal. There is no tenderness.  Site of chole drain is NT No bleeding; no sign of infection Output bile 95 cc yesterday 20 cc in bag  Site of perichole abscess drain is NT; no bleeding Clean and dry Output purulent Output 25 cc yesterday 10 cc in JP Cx abundant wbc   Nursing note and vitals reviewed.   Imaging: Mr Abdomen W Wo Contrast  08/24/2014   CLINICAL DATA:  Nausea and vomiting. Cholelithiasis and concern for common bile duct stones.  EXAM: MRI ABDOMEN WITHOUT AND WITH CONTRAST  TECHNIQUE: Multiplanar multisequence MR imaging of the abdomen was performed both before and after the administration of intravenous contrast.  CONTRAST:  47mL MULTIHANCE GADOBENATE DIMEGLUMINE 529 MG/ML IV SOLN  COMPARISON:  CT 08/16/2014  FINDINGS: Exam is degraded by patient respiratory motion.  Lower chest:  There are moderate bilateral pleural effusions.  Hepatobiliary: There is no intrahepatic biliary duct dilatation. No enhancing hepatic lesion. While the exam is motion degraded, the common hepatic duct and common bile duct appear normal caliber. No clear filling defects within the distal common bile duct. 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 Gas within this diverticulum does limit evaluation of the most distal common bile duct (image 27, series 5).  The gallbladder wall is thickened B and hyperemic. There is a fluid collection adjacent to the fundus of the  gallbladder measuring 5.4 x 4.2 cm. Findings suggest obtained of perforation of the gallbladder fundus. Individual gallstones are difficult to appreciate within the lumen of the gallbladder. The percutaneous cholecystostomy tube is not well appreciated.  Pancreas: Periampullary duodenum diverticulum measures 1.8 cm. Pancreatic duct is prominent but not significantly dilated measuring to 3 mm. No  pancreatic mass.  Spleen: Normal spleen.  Adrenals/urinary tract: Adrenal glands and kidneys are normal. There is a nonenhancing cysts within the left kidney measuring 17 mm.  Stomach/Bowel: Limited view of the stomach and small bowel are unremarkable.  Vascular/Lymphatic: Abdominal aorta is normal caliber. No lymphadenopathy.  Musculoskeletal: No aggressive osseous lesion.  IMPRESSION: 1. Exam is degraded by patient respiratory motion. 2. No biliary duct dilatation. The common hepatic duct and common bile duct are normal caliber. No clear evidence of filling defect within the common bile duct. 3. Most distal common bile duct is difficult to assess due to gas with periampullary duodenum diverticulum. 4. Acute cholecystitis. Cholecystostomy tube is not well appreciated by MRI. Stable fluid collection at the fundus of the gallbladder is most suggestive a contained perforation. 5. No evidence acute pancreatitis. 1.   Electronically Signed   By: Suzy Bouchard M.D.   On: 08/24/2014 09:03   [place] Abscess Drain  08/24/2014   CLINICAL DATA:  74 year old male with recent diagnosis of acute calculus cholecystitis. He is currently a poor surgical candidate. Percutaneous cholecystostomy tube placement was performed on 08/17/2014. On recent MRCP to evaluate for choledocholithiasis it was noted that there was a complex fluid collection surrounding the gallbladder fundus consistent for pericholecystic abscess. He presents for ultrasound-guided aspiration and possible drain placement.  EXAM: ULTRASOUND GUIDED ABSCESS DRAINAGE  Date: 08/24/2014  PROCEDURE: 1. Ultrasound-guided placement of a 10 French drain in the pericholecystic fluid collection Interventional Radiologist:  Criselda Peaches, MD  ANESTHESIA/SEDATION: Moderate (conscious) sedation was used. 1 mg Versed, 50 mcg Fentanyl were administered intravenously. The patient's vital signs were monitored continuously by radiology nursing throughout the procedure.  Sedation  Time: 16 minutes  MEDICATIONS: None additional  TECHNIQUE: Informed consent was obtained from the patient following explanation of the procedure, risks, benefits and alternatives. The patient understands, agrees and consents for the procedure. All questions were addressed. A time out was performed.  Maximal barrier sterile technique utilized including caps, mask, sterile gowns, sterile gloves, large sterile drape, hand hygiene, and Betadine skin prep.  The right upper quadrant was interrogated with ultrasound. The complex fluid collection surrounding the gallbladder fundus was successfully identified. This measures up to 5 cm in diameter. A suitable skin entry site was selected and marked. The region was then sterilely prepped and draped in standard fashion. Local anesthesia was attained by infiltration with 1% lidocaine. A small dermatotomy was made. Under real-time sonographic guidance, an 18 gauge trocar needle was advanced into the fluid collection. Aspiration yielded frankly purulent material. The decision was therefore made to proceed with drain placement.  A short 0.035 inch Amplatz wire was coiled within the fluid collection. The skin tract was dilated to 10 [place] and a Cook 10.2 [place] all-purpose drainage catheter was advanced over the wire and coiled within the fluid collection. The locking pigtail loop was formed. Aspiration yielded approximately 25 mL of frankly purulent material. This was sent for Gram stain and culture.  Ultrasound evaluation demonstrates significantly decreased volume of the complex fluid collection. The catheter was gently flushed and connected to JP bulb suction. The catheter tubing was then secured to the skin with 0  Prolene suture and a sterile bandage was applied. The patient tolerated the procedure well.  COMPLICATIONS: None  IMPRESSION: 1. Successful placement of a 10 French percutaneous drainage catheter into the pericholecystic complex fluid collection. Aspiration yielded  25 mL frankly purulent material. A sample was sent for Gram stain and culture.  PLAN: 1. Patient followup with Dr. Vilinda Flake for possible interval cholecystectomy. 2. Drains can be followed in interventional radiology drain clinic. Consider initial follow-up in 1-2 weeks. Signed,  Criselda Peaches, MD  [facility]  [facility]   Electronically Signed   By: Jacqulynn Cadet M.D.   On: 08/24/2014 17:31   US Abdomen Complete  08/23/2014   CLINICAL DATA:  Choledocholithiasis.  Cholecystostomy 08/17/2014.  EXAM: ULTRASOUND ABDOMEN COMPLETE  COMPARISON:  08/17/2014.  CT 08/16/2014.  FINDINGS: Gallbladder: Gallbladder decompressed with multiple stones. Gallbladder wall thickening 4.9 mm.  Common bile duct: Unable to visualize due to the limited windows and bowel gas.  Liver: Normal were visualized. Limited windows due to overlying dressing.  IVC: No abnormality visualized.  Pancreas: Non visualization due to bowel gas.  Spleen: Size and appearance within normal limits.  Right Kidney: Length: 12.8 cm. Echogenicity within normal limits. No mass or hydronephrosis visualized.  Left Kidney: Length: 12.7 cm. Echogenicity within normal limits. No mass or hydronephrosis visualized.  Abdominal aorta: Limited visualization due to bowel gas.  Other findings: Limited exam due to overlying bandaging and bowel gas.  IMPRESSION: 1. Decompressed gallbladder with multiple stones. Gallbladder wall thickening. No pericholecystic fluid collections. Common bile duct not visualized. 2. Limited exam due to overlying bandaging and bowel gas.   Electronically Signed   By: Marcello Moores  Register   On: 08/23/2014 07:15   Mr 3d Recon At Scanner  08/24/2014   CLINICAL DATA:  Nausea and vomiting. Cholelithiasis and concern for common bile duct stones.  EXAM: MRI ABDOMEN WITHOUT AND WITH CONTRAST  TECHNIQUE: Multiplanar multisequence MR imaging of the abdomen was performed both before and after the  administration of intravenous contrast.  CONTRAST:  12mL MULTIHANCE GADOBENATE DIMEGLUMINE 529 MG/ML IV SOLN  COMPARISON:  CT 08/16/2014  FINDINGS: Exam is degraded by patient respiratory motion.  Lower chest:  There are moderate bilateral pleural effusions.  Hepatobiliary: There is no intrahepatic biliary duct dilatation. No enhancing hepatic lesion. While the exam is motion degraded, the common hepatic duct and common bile duct appear normal caliber. No clear filling defects within the distal common bile duct. 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 Gas within this diverticulum does limit evaluation of the most distal common bile duct (image 27, series 5).  The gallbladder wall is thickened B and hyperemic. There is a fluid collection adjacent to the fundus of the gallbladder measuring 5.4 x 4.2 cm. Findings suggest obtained of perforation of the gallbladder fundus. Individual gallstones are difficult to appreciate within the lumen of the gallbladder. The percutaneous cholecystostomy tube is not well appreciated.  Pancreas: Periampullary duodenum diverticulum measures 1.8 cm. Pancreatic duct is prominent but not significantly dilated measuring to 3 mm. No pancreatic mass.  Spleen: Normal spleen.  Adrenals/urinary tract: Adrenal glands and kidneys are normal. There is a nonenhancing cysts within the left kidney measuring 17 mm.  Stomach/Bowel: Limited view of the stomach and small bowel are unremarkable.  Vascular/Lymphatic: Abdominal aorta is normal caliber. No lymphadenopathy.  Musculoskeletal: No aggressive osseous lesion.  IMPRESSION: 1. Exam is degraded by patient respiratory motion. 2. No biliary duct dilatation. The common hepatic duct and common  bile duct are normal caliber. No clear evidence of filling defect within the common bile duct. 3. Most distal common bile duct is difficult to assess due to gas with periampullary duodenum diverticulum. 4. Acute cholecystitis. Cholecystostomy tube is not  well appreciated by MRI. Stable fluid collection at the fundus of the gallbladder is most suggestive a contained perforation. 5. No evidence acute pancreatitis. 1.   Electronically Signed   By: Suzy Bouchard M.D.   On: 08/24/2014 09:03    Labs:  CBC:  Recent Labs  08/21/14 0531 08/22/14 0512 08/23/14 0514 08/24/14 0435  WBC 5.9 7.9 7.2 9.0  HGB 11.6* 11.9* 11.8* 12.0*  HCT 37.1* 37.3* 37.8* 37.4*  PLT 298 292 258 226    COAGS:  Recent Labs  08/24/14 1423  INR 1.14    BMP:  Recent Labs  08/21/14 0531 08/22/14 0512 08/23/14 0514 08/24/14 0435  NA 137 141 139 140  K 3.9 4.4 3.9 3.8  CL 108 112 111 111  CO2 23 23 23 24   GLUCOSE 141* 118* 88 81  BUN 36* 39* 31* 30*  CALCIUM 8.3* 8.2* 8.1* 8.1*  CREATININE 1.25 1.43* 1.21 1.34  GFRNONAA 55* 47* 57* 51*  GFRAA 64* 54* 66* 59*    LIVER FUNCTION TESTS:  Recent Labs  08/20/14 0445 08/21/14 0531 08/22/14 0512 08/24/14 0435  BILITOT 0.9 1.0 1.0 1.1  AST 19 19 23 17   ALT 20 20 23 20   ALKPHOS 90 77 73 65  PROT 5.9* 5.6* 5.3* 5.0*  ALBUMIN 2.4* 2.5* 2.4* 2.3*    Assessment and Plan:  Perc chole drain and pericholecystic abscess drain intact Output good' Feeling better Will follow  Signed: Ryliegh Mcduffey A 08/25/2014, 9:00 AM   I spent a total of 15 Minutes in face to face in clinical consultation/evaluation, greater than 50% of which was counseling/coordinating care for chole drain; pericholecystic abscess drain

## 2014-08-25 NOTE — Progress Notes (Signed)
Subjective: He seems comfortable, drain site is tender, but nothing significant.  He says his breathing is at baseline.  He says he walked some here with walker and uses walker at home.  Drain shows bilious fluid from cholecystostomy tube, and purulent material from new IR drain.  Objective: Vital signs in last 24 hours: Temp:  [97.5 F (36.4 C)-97.8 F (36.6 C)] 97.6 F (36.4 C) (03/03 0541) Pulse Rate:  [55-104] 104 (03/03 0541) Resp:  [17-20] 18 (03/03 0541) BP: (125-149)/(72-89) 127/83 mmHg (03/03 0541) SpO2:  [92 %-97 %] 95 % (03/03 0541) Last BM Date: 08/24/14 25 from abscess drain 95 from biliary drain Full liquid diet Afebrile, VSS No labs Intake/Output from previous day: 03/02 0701 - 03/03 0700 In: -  Out: 2045 [Urine:1925; Drains:120] Intake/Output this shift:    General appearance: alert, cooperative and no distress GI: soft sore, but stable.  Lab Results:   Recent Labs  08/23/14 0514 08/24/14 0435  WBC 7.2 9.0  HGB 11.8* 12.0*  HCT 37.8* 37.4*  PLT 258 226    BMET  Recent Labs  08/23/14 0514 08/24/14 0435  NA 139 140  K 3.9 3.8  CL 111 111  CO2 23 24  GLUCOSE 88 81  BUN 31* 30*  CREATININE 1.21 1.34  CALCIUM 8.1* 8.1*   PT/INR  Recent Labs  08/24/14 1423  LABPROT 14.7  INR 1.14     Recent Labs Lab 08/19/14 0552 08/20/14 0445 08/21/14 0531 08/22/14 0512 08/24/14 0435  AST 23 19 19 23 17   ALT 20 20 20 23 20   ALKPHOS 102 90 77 73 65  BILITOT 1.1 0.9 1.0 1.0 1.1  PROT 6.5 5.9* 5.6* 5.3* 5.0*  ALBUMIN 2.4* 2.4* 2.5* 2.4* 2.3*     Lipase  No results found for: LIPASE   Studies/Results: Mr Abdomen W Wo Contrast  08/24/2014   CLINICAL DATA:  Nausea and vomiting. Cholelithiasis and concern for common bile duct stones.  EXAM: MRI ABDOMEN WITHOUT AND WITH CONTRAST  TECHNIQUE: Multiplanar multisequence MR imaging of the abdomen was performed both before and after the administration of intravenous contrast.  CONTRAST:  38mL  MULTIHANCE GADOBENATE DIMEGLUMINE 529 MG/ML IV SOLN  COMPARISON:  CT 08/16/2014  FINDINGS: Exam is degraded by patient respiratory motion.  Lower chest:  There are moderate bilateral pleural effusions.  Hepatobiliary: There is no intrahepatic biliary duct dilatation. No enhancing hepatic lesion. While the exam is motion degraded, the common hepatic duct and common bile duct appear normal caliber. No clear filling defects within the distal common bile duct. 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 Gas within this diverticulum does limit evaluation of the most distal common bile duct (image 27, series 5).  The gallbladder wall is thickened B and hyperemic. There is a fluid collection adjacent to the fundus of the gallbladder measuring 5.4 x 4.2 cm. Findings suggest obtained of perforation of the gallbladder fundus. Individual gallstones are difficult to appreciate within the lumen of the gallbladder. The percutaneous cholecystostomy tube is not well appreciated.  Pancreas: Periampullary duodenum diverticulum measures 1.8 cm. Pancreatic duct is prominent but not significantly dilated measuring to 3 mm. No pancreatic mass.  Spleen: Normal spleen.  Adrenals/urinary tract: Adrenal glands and kidneys are normal. There is a nonenhancing cysts within the left kidney measuring 17 mm.  Stomach/Bowel: Limited view of the stomach and small bowel are unremarkable.  Vascular/Lymphatic: Abdominal aorta is normal caliber. No lymphadenopathy.  Musculoskeletal: No aggressive osseous lesion.  IMPRESSION: 1. Exam is degraded by  patient respiratory motion. 2. No biliary duct dilatation. The common hepatic duct and common bile duct are normal caliber. No clear evidence of filling defect within the common bile duct. 3. Most distal common bile duct is difficult to assess due to gas with periampullary duodenum diverticulum. 4. Acute cholecystitis. Cholecystostomy tube is not well appreciated by MRI. Stable fluid collection at the  fundus of the gallbladder is most suggestive a contained perforation. 5. No evidence acute pancreatitis. 1.   Electronically Signed   By: Suzy Bouchard M.D.   On: 08/24/2014 09:03   [place] Abscess Drain  08/24/2014   CLINICAL DATA:  74 year old male with recent diagnosis of acute calculus cholecystitis. He is currently a poor surgical candidate. Percutaneous cholecystostomy tube placement was performed on 08/17/2014. On recent MRCP to evaluate for choledocholithiasis it was noted that there was a complex fluid collection surrounding the gallbladder fundus consistent for pericholecystic abscess. He presents for ultrasound-guided aspiration and possible drain placement.  EXAM: ULTRASOUND GUIDED ABSCESS DRAINAGE  Date: 08/24/2014  PROCEDURE: 1. Ultrasound-guided placement of a 10 French drain in the pericholecystic fluid collection Interventional Radiologist:  Criselda Peaches, MD  ANESTHESIA/SEDATION: Moderate (conscious) sedation was used. 1 mg Versed, 50 mcg Fentanyl were administered intravenously. The patient's vital signs were monitored continuously by radiology nursing throughout the procedure.  Sedation Time: 16 minutes  MEDICATIONS: None additional  TECHNIQUE: Informed consent was obtained from the patient following explanation of the procedure, risks, benefits and alternatives. The patient understands, agrees and consents for the procedure. All questions were addressed. A time out was performed.  Maximal barrier sterile technique utilized including caps, mask, sterile gowns, sterile gloves, large sterile drape, hand hygiene, and Betadine skin prep.  The right upper quadrant was interrogated with ultrasound. The complex fluid collection surrounding the gallbladder fundus was successfully identified. This measures up to 5 cm in diameter. A suitable skin entry site was selected and marked. The region was then sterilely prepped and draped in standard fashion. Local anesthesia was attained by infiltration with  1% lidocaine. A small dermatotomy was made. Under real-time sonographic guidance, an 18 gauge trocar needle was advanced into the fluid collection. Aspiration yielded frankly purulent material. The decision was therefore made to proceed with drain placement.  A short 0.035 inch Amplatz wire was coiled within the fluid collection. The skin tract was dilated to 10 [place] and a Cook 10.2 [place] all-purpose drainage catheter was advanced over the wire and coiled within the fluid collection. The locking pigtail loop was formed. Aspiration yielded approximately 25 mL of frankly purulent material. This was sent for Gram stain and culture.  Ultrasound evaluation demonstrates significantly decreased volume of the complex fluid collection. The catheter was gently flushed and connected to JP bulb suction. The catheter tubing was then secured to the skin with 0 Prolene suture and a sterile bandage was applied. The patient tolerated the procedure well.  COMPLICATIONS: None  IMPRESSION: 1. Successful placement of a 10 French percutaneous drainage catheter into the pericholecystic complex fluid collection. Aspiration yielded 25 mL frankly purulent material. A sample was sent for Gram stain and culture.  PLAN: 1. Patient followup with Dr. Vilinda Flake for possible interval cholecystectomy. 2. Drains can be followed in interventional radiology drain clinic. Consider initial follow-up in 1-2 weeks. Signed,  Criselda Peaches, MD  [facility]  [facility]   Electronically Signed   By: Jacqulynn Cadet M.D.   On: 08/24/2014 17:31   Mr 3d Recon At  Scanner  08/24/2014   CLINICAL DATA:  Nausea and vomiting. Cholelithiasis and concern for common bile duct stones.  EXAM: MRI ABDOMEN WITHOUT AND WITH CONTRAST  TECHNIQUE: Multiplanar multisequence MR imaging of the abdomen was performed both before and after the administration of intravenous contrast.  CONTRAST:  25mL MULTIHANCE GADOBENATE  DIMEGLUMINE 529 MG/ML IV SOLN  COMPARISON:  CT 08/16/2014  FINDINGS: Exam is degraded by patient respiratory motion.  Lower chest:  There are moderate bilateral pleural effusions.  Hepatobiliary: There is no intrahepatic biliary duct dilatation. No enhancing hepatic lesion. While the exam is motion degraded, the common hepatic duct and common bile duct appear normal caliber. No clear filling defects within the distal common bile duct. 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 Gas within this diverticulum does limit evaluation of the most distal common bile duct (image 27, series 5).  The gallbladder wall is thickened B and hyperemic. There is a fluid collection adjacent to the fundus of the gallbladder measuring 5.4 x 4.2 cm. Findings suggest obtained of perforation of the gallbladder fundus. Individual gallstones are difficult to appreciate within the lumen of the gallbladder. The percutaneous cholecystostomy tube is not well appreciated.  Pancreas: Periampullary duodenum diverticulum measures 1.8 cm. Pancreatic duct is prominent but not significantly dilated measuring to 3 mm. No pancreatic mass.  Spleen: Normal spleen.  Adrenals/urinary tract: Adrenal glands and kidneys are normal. There is a nonenhancing cysts within the left kidney measuring 17 mm.  Stomach/Bowel: Limited view of the stomach and small bowel are unremarkable.  Vascular/Lymphatic: Abdominal aorta is normal caliber. No lymphadenopathy.  Musculoskeletal: No aggressive osseous lesion.  IMPRESSION: 1. Exam is degraded by patient respiratory motion. 2. No biliary duct dilatation. The common hepatic duct and common bile duct are normal caliber. No clear evidence of filling defect within the common bile duct. 3. Most distal common bile duct is difficult to assess due to gas with periampullary duodenum diverticulum. 4. Acute cholecystitis. Cholecystostomy tube is not well appreciated by MRI. Stable fluid collection at the fundus of the gallbladder  is most suggestive a contained perforation. 5. No evidence acute pancreatitis. 1.   Electronically Signed   By: Suzy Bouchard M.D.   On: 08/24/2014 09:03    Medications: . chlorproMAZINE (THORAZINE) injection  25 mg Intramuscular Once  . finasteride  5 mg Oral QPM  . FLUoxetine  40 mg Oral Q breakfast  . heparin  5,000 Units Subcutaneous 3 times per day  . ipratropium  0.5 mg Nebulization Q4H  . levalbuterol  0.63 mg Nebulization Q4H  . metoprolol tartrate  25 mg Oral Q breakfast  . piperacillin-tazobactam (ZOSYN)  IV  3.375 g Intravenous 3 times per day  . predniSONE  40 mg Oral Q breakfast  . simvastatin  20 mg Oral QPM  . traMADol  100 mg Oral 3 times per day  . traZODone  100 mg Oral QHS    Assessment/Plan 2. Sepsis secondary to gangrenous cholecystitis Status post percutaneous cholecystotomy 2/24- stable on IV Zosyn ,Potentially can transition to by mouth Augmentin. Has received a total of 9 days IV antibiotics Second drain place 08/24/14 new abscess surrounding GB 3. COPD/sarcoidosis Gold stage II- on Prednisone and albuterol 4. History of diastolic heart failure on BB 5. Hyperlipidemia - continue Zocor 20 mg daily 6. Depression-continue trazodone 100 daily at bedtime , fluoxetine 40 daily  7. Acute renal failure- improving 8. BPH-continue finasteride 5 mg daily 9.  He has had 8 days of Zosyn, starting day 9.  10.  DVT prophylaxis:  Heparin/SCD     Plan:  Defer antibiotics to Medicine, we have left follow up with Dr. Lucia Gaskins from our service and this is in the AVS.  Stable from our standpoint.  LOS: 9 days    Tristyn Pharris 08/25/2014

## 2014-08-25 NOTE — Progress Notes (Signed)
Nurse reviewed discharge instructions with pt and his wife.  Both pt and wife verbalized understanding of discharge instructions, follow up appointment and new medications.  Appointment made for pt to see his primary care physician next week.  Pt's wife demonstrated to nurse how to empty both of pt's drain and flush biliary drain.   No concerns at time of discharge.  Prescriptions given to pt prior to discharge.

## 2014-08-28 LAB — CULTURE, ROUTINE-ABSCESS: SPECIAL REQUESTS: NORMAL

## 2014-09-14 ENCOUNTER — Other Ambulatory Visit: Payer: Self-pay | Admitting: Surgery

## 2014-09-14 ENCOUNTER — Telehealth: Payer: Self-pay | Admitting: Pulmonary Disease

## 2014-09-14 ENCOUNTER — Encounter: Payer: Self-pay | Admitting: Pulmonary Disease

## 2014-09-14 ENCOUNTER — Ambulatory Visit (INDEPENDENT_AMBULATORY_CARE_PROVIDER_SITE_OTHER): Payer: Medicare Other | Admitting: Pulmonary Disease

## 2014-09-14 VITALS — BP 110/64 | HR 98 | Ht 72.0 in | Wt 249.2 lb

## 2014-09-14 DIAGNOSIS — D860 Sarcoidosis of lung: Secondary | ICD-10-CM | POA: Diagnosis not present

## 2014-09-14 DIAGNOSIS — J449 Chronic obstructive pulmonary disease, unspecified: Secondary | ICD-10-CM

## 2014-09-14 DIAGNOSIS — Z01811 Encounter for preprocedural respiratory examination: Secondary | ICD-10-CM | POA: Diagnosis not present

## 2014-09-14 DIAGNOSIS — K819 Cholecystitis, unspecified: Secondary | ICD-10-CM

## 2014-09-14 NOTE — Patient Instructions (Signed)
Follow up in 3 months

## 2014-09-14 NOTE — Progress Notes (Signed)
Chief Complaint  Patient presents with  . Follow-up    CT chest 08/19/14. Pt reports having ONO and being diagnosed with low noturnal O2, needs O2. Pt states that his O2 levels at rest is 90-95% daily.  Pt would like to discuss repeating ONO - feels this is not accurate based on his current health condition.    History of Present Illness: Travis Ferguson is a 74 y.o. male former smoker with COPD, and sarcoidosis.  He was in hospital for sepsis related to gangrenous cholecystitis and had per drain.  His breathing has been okay.  He gets occasional cough and chest congestion.  He brings up clear sputum.  He denies fever, or hemoptysis.  He feels nebulizer works better than spiriva.  He is still using symbicort.  He had ONO which showed low oxygen level, and he was advised to use oxygen at night.   TESTS: PSG May 2008>> AHI 8  Spirometry 11/18/08>>FEV1 2.15 (61%), FEV1% 62  Bronchoscopy 07/28/12 >> focal granulomatous inflammation Spirometry 03/14/14 >> FEV1 2.10 (60%), FEV1% 57 Echo 08/19/14 >> EF 50 to 44%, grade 1 diastolic dysfx   CHEST IMAGING: CT chest 05/19/12>>superior segment LLL infiltrate, b/l hilar and mediastinal LAN with calcification CT chest 07/13/12 >> atherosclerosis, Lt hilar adenopathy, LLL superior segment narrowing, mass like opacity superior segment LLL 5.4 x 1.9 cm, multiple nodular areas LLL, diffuse GGO LLL PET scan 07/22/12 >> mildly hypermetabolic mediastinal adenopathy (SUV 5.8), superior segment nodules hypermetabolic (SUV 96.7) CT chest 08/31/12 >> decreased size of lesions CT chest 12/02/12 >> borderline mediastinal/hilar LAN, nodular septal thickening b/l, no change superior segment LLL, centrilobular emphysema CT chest 03/19/13 >> ?slight increase in LLL consolidative change, borderline LAN CT chest 08/24/13 >> 1.9 cm Rt paratracheal LAN, calcified 2.1 cm subcarinal LAN, narrowing of mainstem bronchi, chronic infiltrate LLL CT chest 03/16/14 >> atherosclerosis, no  change in mediastinal and b/l hilar LAN, no change in LLL mass like opacity, thickening of interstitium, scattered sub-pleural nodules, small Lt pleural effusion, gallstones CT chest 08/19/14 >> small b/l effusions, emphysema, no change LLL  PMHx >> Depression, PSTD, Migraines, CAD, HTN, HLD, Back pain  PSHx, Medications, Allergies, Fhx, Shx reviewed.  Physical Exam: Blood pressure 122/80, pulse 87, height 6' (1.829 m), weight 264 lb 6.4 oz (119.931 kg), SpO2 93 %. There is no weight on file to calculate BMI.  General - No distress ENT - no sinus tenderness, no oral exudate, no LAN Cardiac - s1s2 regular, no murmur Chest - prolonged exhalation, no wheeze/rales Back - No focal tenderness Abd - Soft, non-tender, drain in RUQ Ext - No edema Neuro - Normal strength Skin - No rashes Psych - normal mood, and behavior   Assessment/Plan:  COPD with chronic bronchitis. Stable on current regimen. Plan: - continue symbicort, and prn ipratropium/xopenex nebulizer tx or proair  Nocturnal hypoxia. I suspect this is related to sleep apnea, but could also be related COPD and sarcoidosis.  He would not want to wear oxygen during the day, but is okay with using oxygen at night for now until he has recovered from his gallbladder. Plan: - he has home nocturnal oxygen set up through his PCP  Pulmonary sarcoidosis. This has been stable on recent chest imaging studies. Plan: - monitor clinically  Gangrenous cholecystitis. Plan: - He has perc drain and is scheduled for f/u with surgery  Pre-operative respiratory exam. He is being assessed for cholecystectomy.  I have explained to him that he is at increased risk  for perioperative pulmonary complications particular with risk for respiratory failure needing prolonged ventilator support and pneumonia.  He had a life threatening event related to his gallbladder, and in this situation benefits of surgery will likely outweigh the risk. Plan: - if he  has surgery, then he should continue on his inhaler regimen - he will need close monitoring of his oxygen and should be monitored in an ICU setting for the initial post-operative period - pulmonary service can be called for assistance in hospital after his surgery - he does not need any additional pulmonary testing at this time   Chesley Mires, MD [facility] Pager:  [phone] 09/14/2014, 3:15 PM

## 2014-09-14 NOTE — Telephone Encounter (Signed)
Will route to VS so that he will be aware.

## 2014-09-15 ENCOUNTER — Other Ambulatory Visit: Payer: Self-pay | Admitting: Surgery

## 2014-09-15 DIAGNOSIS — K819 Cholecystitis, unspecified: Secondary | ICD-10-CM

## 2014-09-19 ENCOUNTER — Other Ambulatory Visit: Payer: Self-pay | Admitting: Surgery

## 2014-09-19 DIAGNOSIS — K651 Peritoneal abscess: Secondary | ICD-10-CM

## 2014-09-20 ENCOUNTER — Other Ambulatory Visit: Payer: Self-pay | Admitting: Surgery

## 2014-09-20 DIAGNOSIS — K651 Peritoneal abscess: Secondary | ICD-10-CM

## 2014-09-23 HISTORY — PX: GALLBLADDER SURGERY: SHX652

## 2014-09-27 ENCOUNTER — Ambulatory Visit (HOSPITAL_COMMUNITY)
Admission: RE | Admit: 2014-09-27 | Discharge: 2014-09-27 | Disposition: A | Discharge status: Home / Self Care | Payer: Medicare Other | Source: Ambulatory Visit | Attending: Interventional Radiology | Admitting: Interventional Radiology

## 2014-09-27 ENCOUNTER — Other Ambulatory Visit (HOSPITAL_COMMUNITY): Payer: Self-pay | Admitting: Interventional Radiology

## 2014-09-27 ENCOUNTER — Ambulatory Visit (HOSPITAL_COMMUNITY)
Admission: RE | Admit: 2014-09-27 | Discharge: 2014-09-27 | Disposition: A | Discharge status: Home / Self Care | Payer: Medicare Other | Source: Ambulatory Visit | Attending: Surgery | Admitting: Surgery

## 2014-09-27 ENCOUNTER — Encounter (HOSPITAL_COMMUNITY): Payer: Self-pay

## 2014-09-27 DIAGNOSIS — K819 Cholecystitis, unspecified: Secondary | ICD-10-CM

## 2014-09-27 MED ORDER — IOHEXOL 300 MG/ML  SOLN
80.0000 mL | Freq: Once | INTRAMUSCULAR | Status: AC | PRN
  Administered 2014-09-27: 80 mL via INTRAVENOUS

## 2014-09-27 MED ORDER — IOHEXOL 300 MG/ML  SOLN
50.0000 mL | Freq: Once | INTRAMUSCULAR | Status: AC | PRN
  Administered 2014-09-27: 10 mL

## 2014-10-04 ENCOUNTER — Encounter: Payer: Self-pay | Admitting: Pulmonary Disease

## 2014-10-04 NOTE — Telephone Encounter (Signed)
Dr. Sood - please advise. Thanks. 

## 2014-10-05 ENCOUNTER — Encounter: Payer: Self-pay | Admitting: Pulmonary Disease

## 2014-10-06 ENCOUNTER — Telehealth: Payer: Self-pay | Admitting: Pulmonary Disease

## 2014-10-06 NOTE — Telephone Encounter (Signed)
lmtcb for pt.  

## 2014-10-06 NOTE — Telephone Encounter (Signed)
Spoke with Tiffany with [facility], states spiriva was d/c'ed when he was released from hospital, wants to know if he needs to restart spiriva.  Pt is regularly taking Symbicort and rescue inhalers/nebulizers as needed.    Tiffany aware that VS won't be back until Tuesday, and is ok with waiting for recs as pt is stable on current regimen.  Dr. Halford Chessman please advise on recs.  Thanks!

## 2014-10-11 NOTE — Telephone Encounter (Signed)
According to discharge summary from 08/25/14 he was supposed to continue spiriva.  He should resume spiriva one puff daily.  Please send script if needed.

## 2014-10-11 NOTE — Telephone Encounter (Signed)
lmtcb for Tiffany.

## 2014-10-12 NOTE — Telephone Encounter (Signed)
Spoke with Tiffany, aware of rec's per VS Nothing further needed.

## 2014-10-20 ENCOUNTER — Other Ambulatory Visit: Payer: Self-pay | Admitting: Surgery

## 2014-10-21 ENCOUNTER — Telehealth: Payer: Self-pay

## 2014-10-21 ENCOUNTER — Other Ambulatory Visit: Payer: Self-pay

## 2014-10-21 DIAGNOSIS — K8020 Calculus of gallbladder without cholecystitis without obstruction: Secondary | ICD-10-CM

## 2014-10-21 NOTE — Telephone Encounter (Signed)
CCS Debbie with Dr. Lucia Gaskins and pt aware of appts. Debbie knows Dr. Henrene Pastor is ok with May 17th appt.

## 2014-10-21 NOTE — Telephone Encounter (Signed)
-----   Message from Irene Shipper, MD sent at 10/21/2014  4:11 PM EDT ----- Regarding: RE: Needs office appointment Yes, thanks. ----- Message -----    From: Algernon Huxley, RN    Sent: 10/21/2014   2:15 PM      To: Irene Shipper, MD Subject: RE: Needs office appointment                   Done.  Is it ok to call Newman's office back and let them know you are good with the 17th?  ----- Message -----    From: Irene Shipper, MD    Sent: 10/21/2014   1:51 PM      To: Algernon Huxley, RN Subject: RE: Needs office appointment                   Perfect. Give the patient preop Cipro 400 mg IV 1. Check allergies. Thanks ----- Message -----    From: Algernon Huxley, RN    Sent: 10/21/2014  11:47 AM      To: Irene Shipper, MD Subject: RE: Needs office appointment                   Scheduled pt for ERCP at [facility] 11/07/14'@1'$ :15pm with General anesthesia, scheduled with Benjamine Mola. Let me know if ok with you.  ----- Message -----    From: Irene Shipper, MD    Sent: 10/21/2014  10:52 AM      To: Algernon Huxley, RN Subject: RE: Needs office appointment                   We need to schedule ERCP on Monday, May 16 with general anesthesia. Confirm with me. Thanks  ----- Message -----    From: Algernon Huxley, RN    Sent: 10/21/2014  10:45 AM      To: Irene Shipper, MD Subject: RE: Needs office appointment                   Pt scheduled to see you Monday 10/24/14'@9'$ :45am. Received call from Dr. Pollie Friar office that the case will be 11/08/14'@7'$ :30am at [facility]. Debbie his nurse wants me to call back and confirm that this will work with you. Please advise.  Thanks, Vaughan Basta   ----- Message -----    From: Irene Shipper, MD    Sent: 10/21/2014  10:03 AM      To: Algernon Huxley, RN Subject: Needs office appointment                       Vaughan Basta, please get this patient into see me next week. He is going to need an ERCP in a few weeks. I will fill you and on the details later. Thank you

## 2014-10-24 ENCOUNTER — Encounter: Payer: Self-pay | Admitting: Internal Medicine

## 2014-10-24 ENCOUNTER — Ambulatory Visit (INDEPENDENT_AMBULATORY_CARE_PROVIDER_SITE_OTHER): Payer: Medicare Other | Admitting: Internal Medicine

## 2014-10-24 VITALS — BP 94/60 | HR 84 | Ht 70.0 in | Wt 247.5 lb

## 2014-10-24 DIAGNOSIS — J441 Chronic obstructive pulmonary disease with (acute) exacerbation: Secondary | ICD-10-CM | POA: Diagnosis not present

## 2014-10-24 DIAGNOSIS — K8050 Calculus of bile duct without cholangitis or cholecystitis without obstruction: Secondary | ICD-10-CM | POA: Diagnosis not present

## 2014-10-24 NOTE — Progress Notes (Signed)
HISTORY OF PRESENT ILLNESS:  Travis Ferguson is a 74 y.o. male with COPD, hypertension, obesity, and depression. I saw the patient on one occasion 08/02/2014 for outpatient screening colonoscopy. He was found to have severe diverticulosis, right-sided angiodysplasia, and 4 diminutive polyps which were removed and found to be both adenomatous and hyperplastic. Patient was hospitalized late February 2016 with nausea and vomiting. He was found to have gangrenous cholecystitis for which cholecystostomy tube was placed. Additional imaging suggested choledocholithiasis. No ERCP performed due to exacerbation of COPD. Injection of contrast through cholecystostomy 4 weeks ago revealed choledocholithiasis. I was contacted by his general surgeon, Dr. Lucia Gaskins, last week regarding possible ERCP with ductal clearance of stones pre-cholecystectomy. Patient presents today with his wife. Overall he still feels poorly, though has had ongoing improvement. No further pain or vomiting. Breathing has improved. He is depressed.  REVIEW OF SYSTEMS:  All non-GI ROS negative except for sinus and allergy, anxiety, arthritis, back pain, depression, fatigue, shortness of breath, insomnia  Past Medical History  Diagnosis Date  . 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  . Depression   . PTSD (post-traumatic stress disorder)   . OSA (obstructive sleep apnea)   . Migraine   . GERD (gastroesophageal reflux disease)   . Dyslipidemia   . Rhinitis   . Cervical disc disease   . Subdural hematoma 11/10    a. 2010 s/p surgery - diagnosed several weeks after a fall.  . Hypertension   . Coronary artery calcification seen on CAT scan   . Sarcoidosis   . Atherosclerosis   . Dyspnea     chronic  . Back pain     chronic back pain/under pain management  . Inguinal hernia     right side  . Colon polyps     adenomatous and hyperplastic  . Hiatal hernia   . Sepsis   . Gallstones      Past Surgical History  Procedure Laterality Date  . Craniotomy  11/10    right frontal  . Video bronchoscopy  07/28/2012    Procedure: VIDEO BRONCHOSCOPY WITH FLUORO;  Surgeon: Chesley Mires, MD;  Location: [facility];  Service: Cardiopulmonary;  Laterality: Bilateral;  . Hernia repair Bilateral     right and left inguinal  . Knee arthroscopy Right   . Lumbar laminectomy/decompression microdiscectomy Right 12/15/2012    Procedure: LUMBAR LAMINECTOMY/DECOMPRESSION MICRODISCECTOMY 1 LEVEL;  Surgeon: Elaina Hoops, MD;  Location: [facility];  Service: Neurosurgery;  Laterality: Right;  Right Lumbar four-five laminectomy/foraminotomy  . Gallbladder surgery      drain tube placed    Social History Travis Ferguson  reports that he quit smoking about 19 years ago. His smoking use included Pipe. He has never used smokeless tobacco. He reports that he does not drink alcohol or use illicit drugs.  family history includes Colon polyps in his brother; Congestive Heart Failure in his brother; Coronary artery disease in an other family member; Heart disease in his father and mother; Stomach cancer in his paternal grandmother. There is no history of Colon cancer, Pancreatic cancer, Rectal cancer, or Esophageal cancer.  Allergies  Allergen Reactions  . Statins Other (See Comments)    Muscle aches - tolerating Vytorin       PHYSICAL EXAMINATION: Vital signs: BP 94/60 mmHg  Pulse 84  Ht '5\' 10"'$  (1.778 m)  Wt 247 lb 8 oz (112.265 kg)  BMI 35.51 kg/m2  Constitutional:  Obese, generally well-appearing, no acute distress Psychiatric: Depressed-appearing. alert and oriented x3, cooperative Eyes: extraocular movements intact, anicteric, conjunctiva pink Mouth: oral pharynx moist, no lesions Neck: supple no lymphadenopathy Cardiovascular: heart regular rate and rhythm, no murmur Lungs: clear to auscultation bilaterally Abdomen: soft, obese, nontender, nondistended, no obvious ascites, no peritoneal  signs, normal bowel sounds, no organomegaly. Cholecystostomy tube in place Rectal: Ommitted Extremities: no lower extremity edema bilaterally Skin: no lesions on visible extremities Neuro: No focal deficits.    ASSESSMENT:  #1. Acute calculus cholecystitis status post cholecystostomy tube placement February 2016. Stable #2. Choledocholithiasis #3. Significant comorbidities including COPD and obesity   PLAN:  #1. ERCP with sphincterotomy and stone extraction. The patient is high-risk given his comorbidities.The nature of the procedure, as well as the risks (acute pancreatitis, perforation, bleeding, infection), benefits, and alternatives were carefully and thoroughly reviewed with the patient. Ample time for discussion and questions allowed. The patient understood, was satisfied, and agreed to proceed. The procedure has been scheduled for Monday, May 16 at 1 PM. This will be with general anesthesia.

## 2014-10-24 NOTE — Patient Instructions (Signed)
You have been scheduled for an ERCP at [facility]. Please follow written instructions given to you at your visit today. If you use inhalers (even only as needed), please bring them with you on the day of your procedure.

## 2014-10-27 ENCOUNTER — Telehealth: Payer: Self-pay | Admitting: Pulmonary Disease

## 2014-10-27 ENCOUNTER — Encounter (HOSPITAL_COMMUNITY): Payer: Self-pay | Admitting: *Deleted

## 2014-10-27 MED ORDER — AMOXICILLIN-POT CLAVULANATE 875-125 MG PO TABS: 1.0000 | ORAL_TABLET | Freq: Two times a day (BID) | ORAL | Status: DC

## 2014-10-27 NOTE — Telephone Encounter (Signed)
Pt aware of recs. RX sent in. Nothing further needed 

## 2014-10-27 NOTE — Telephone Encounter (Signed)
Offer augmentin 875 mg, # 10, 1 twice daily, and suggest Mucinex-DM otc

## 2014-10-27 NOTE — Patient Instructions (Addendum)
Your procedure is scheduled on: Nov 07, 2014 Monday with Dr. Henrene Pastor. Report to [facility] Admitting at: 1145 AM (Bring insurance card and picture ID. Call this number if you have problems morning of your procedure:[phone]  Follow all bowel prep instructions per your doctor's orders.  Do not eat or drink anything after midnight the night before your procedure. You may brush your teeth, rinse out your mouth, but no water, no food, no chewing gum, no mints, no candies, no chewing tobacco.     Take these medicines the morning of your procedure with A SIP OF WATER: Duloxetine(Prozac). Metoprolol. Use/ bring Inhalers. Use nasal spray as need.   Please make arrangements for a responsible person to drive you home after the procedure. You cannot go home by cab/taxi. We recommend you have someone with you at home the first 24 hours after your procedure.   LEAVE ALL VALUABLES, JEWELRY, BILLFOLD AT HOME.  NO DENTURES, CONTACT LENSES ALLOWED IN THE ENDOSCOPY ROOM. Bring eye glass case for any eyeglasses worn.  YOU MAY WEAR DEODORANT, PLEASE REMOVE ALL JEWELRY, WATCHES RINGS, BODY PIERCINGS AND LEAVE AT HOME.   WOMEN: NO MAKE-UP, LOTIONS PERFUMES   [facility] - Preparing for Surgery Before surgery, you can play an important role.  Because skin is not sterile, your skin needs to be as free of germs as possible.  You can reduce the number of germs on your skin by washing with CHG (chlorahexidine gluconate) soap before surgery.  CHG is an antiseptic cleaner which kills germs and bonds with the skin to continue killing germs even after washing. Please DO NOT use if you have an allergy to CHG or antibacterial soaps.  If your skin becomes reddened/irritated stop using the CHG and inform your nurse when you arrive at Short Stay. Do not shave (including legs and underarms) for at least 48 hours prior to the first CHG shower.  You may shave your face/neck. Please follow these instructions carefully:  1.   Shower with CHG Soap the night before surgery and the  morning of Surgery.  2.  If you choose to wash your hair, wash your hair first as usual with your  normal  shampoo.  3.  After you shampoo, rinse your hair and body thoroughly to remove the  shampoo.                           4.  Use CHG as you would any other liquid soap.  You can apply chg directly  to the skin and wash                       Gently with a scrungie or clean washcloth.  5.  Apply the CHG Soap to your body ONLY FROM THE NECK DOWN.   Do not use on face/ open                           Wound or open sores. Avoid contact with eyes, ears mouth and genitals (private parts).                       Wash face,  Genitals (private parts) with your normal soap.             6.  Wash thoroughly, paying special attention to the area where your surgery  will be performed.  7.  Thoroughly rinse your body with warm water from the neck down.  8.  DO NOT shower/wash with your normal soap after using and rinsing off  the CHG Soap.                9.  Pat yourself dry with a clean towel.            10.  Wear clean pajamas.            11.  Place clean sheets on your bed the night of your first shower and do not  sleep with pets. Day of Surgery : Do not apply any lotions/deodorants the morning of surgery.  Please wear clean clothes to the hospital/surgery center.  FAILURE TO FOLLOW THESE INSTRUCTIONS MAY RESULT IN THE CANCELLATION OF YOUR SURGERY PATIENT SIGNATURE_________________________________  NURSE SIGNATURE__________________________________  ________________________________________________________________________

## 2014-10-27 NOTE — Telephone Encounter (Signed)
Called spoke with pt. He c/o nasal cong, PND, prod cough (clear-yellow phlem). No wheezing, no chest tx, breathing is stable. He is scheduled for surgery and he is wanting to be cleared up prior to this. Please advise recs for pt VS thanks  Allergies  Allergen Reactions  . Statins Other (See Comments)    Muscle aches - tolerating Vytorin

## 2014-10-27 NOTE — Telephone Encounter (Signed)
Please advise Dr. Young thanks 

## 2014-11-03 ENCOUNTER — Ambulatory Visit (HOSPITAL_COMMUNITY)
Admission: RE | Admit: 2014-11-03 | Discharge: 2014-11-03 | Disposition: A | Discharge status: Home / Self Care | Payer: Medicare Other | Source: Ambulatory Visit | Attending: Anesthesiology | Admitting: Anesthesiology

## 2014-11-03 ENCOUNTER — Encounter (HOSPITAL_COMMUNITY): Payer: Self-pay

## 2014-11-03 ENCOUNTER — Inpatient Hospital Stay (HOSPITAL_COMMUNITY): Admission: RE | Admit: 2014-11-03 | Payer: Medicare Other | Source: Ambulatory Visit

## 2014-11-03 ENCOUNTER — Encounter (HOSPITAL_COMMUNITY)
Admission: RE | Admit: 2014-11-03 | Discharge: 2014-11-03 | Disposition: A | Discharge status: Home / Self Care | Payer: Medicare Other | Source: Ambulatory Visit | Attending: Surgery | Admitting: Surgery

## 2014-11-03 DIAGNOSIS — K819 Cholecystitis, unspecified: Secondary | ICD-10-CM | POA: Diagnosis not present

## 2014-11-03 DIAGNOSIS — Z01810 Encounter for preprocedural cardiovascular examination: Secondary | ICD-10-CM

## 2014-11-03 DIAGNOSIS — Z01812 Encounter for preprocedural laboratory examination: Secondary | ICD-10-CM | POA: Insufficient documentation

## 2014-11-03 DIAGNOSIS — Z01818 Encounter for other preprocedural examination: Secondary | ICD-10-CM | POA: Insufficient documentation

## 2014-11-03 HISTORY — DX: Anxiety disorder, unspecified: F41.9

## 2014-11-03 HISTORY — DX: Unspecified osteoarthritis, unspecified site: M19.90

## 2014-11-03 LAB — BASIC METABOLIC PANEL
Anion gap: 4 — ABNORMAL LOW (ref 5–15)
BUN: 14 mg/dL (ref 6–20)
CHLORIDE: 110 mmol/L (ref 101–111)
CO2: 21 mmol/L — AB (ref 22–32)
Calcium: 9.2 mg/dL (ref 8.9–10.3)
Creatinine, Ser: 0.86 mg/dL (ref 0.61–1.24)
GFR calc Af Amer: >60 mL/min (ref >60)
GFR calc non Af Amer: >60 mL/min (ref >60)
Glucose, Bld: 102 mg/dL — ABNORMAL HIGH (ref 65–99)
POTASSIUM: 3.8 mmol/L (ref 3.5–5.1)
Sodium: 135 mmol/L (ref 135–145)

## 2014-11-03 LAB — CBC
HCT: 41.2 % (ref 39.0–52.0)
Hemoglobin: 13.6 g/dL (ref 13.0–17.0)
MCH: 27.3 pg (ref 26.0–34.0)
MCHC: 33 g/dL (ref 30.0–36.0)
MCV: 82.7 fL (ref 78.0–100.0)
PLATELETS: 285 10*3/uL (ref 150–400)
RBC: 4.98 MIL/uL (ref 4.22–5.81)
RDW: 14.5 % (ref 11.5–15.5)
WBC: 9.1 10*3/uL (ref 4.0–10.5)

## 2014-11-03 NOTE — Progress Notes (Signed)
No orders in epic at time of PAT appt 11/03/2014 in relation to surgery scheduled for 11/08/2014. Please place orders in. Thanks.

## 2014-11-03 NOTE — Progress Notes (Addendum)
OV note / epic per Dr Halford Chessman 09/14/2014 discussed surgical risk / clearance noted - discussed continuing inhaler regimen with surgical procedure  OV note per chart per Dr Joylene Draft 10/27/2014  OV note per cardiology/Dr Gwenlyn Found / epic 05/11/2014  ECHO / epic 08/19/2014  Spoke with Dr Hodierne/anesthesia in regards to pts H&P. Requested EKG and CXR-orders noted. Anesthesia to see pt day of surgery.

## 2014-11-07 ENCOUNTER — Ambulatory Visit (HOSPITAL_COMMUNITY): Payer: Medicare Other

## 2014-11-07 ENCOUNTER — Encounter (HOSPITAL_COMMUNITY): Payer: Self-pay | Admitting: Internal Medicine

## 2014-11-07 ENCOUNTER — Ambulatory Visit (HOSPITAL_COMMUNITY): Payer: Medicare Other | Admitting: Anesthesiology

## 2014-11-07 ENCOUNTER — Encounter (HOSPITAL_COMMUNITY)
Admission: RE | Disposition: A | Discharge status: Home / Self Care | Payer: Self-pay | Source: Ambulatory Visit | Attending: Internal Medicine

## 2014-11-07 ENCOUNTER — Inpatient Hospital Stay (HOSPITAL_COMMUNITY)
Admission: RE | Admit: 2014-11-07 | Discharge: 2014-11-10 | DRG: 417 | Disposition: A | Discharge status: Home / Self Care | Payer: Medicare Other | Source: Ambulatory Visit | Attending: Internal Medicine | Admitting: Internal Medicine

## 2014-11-07 DIAGNOSIS — J310 Chronic rhinitis: Secondary | ICD-10-CM | POA: Diagnosis present

## 2014-11-07 DIAGNOSIS — K8034 Calculus of bile duct with chronic cholangitis without obstruction: Secondary | ICD-10-CM

## 2014-11-07 DIAGNOSIS — F4310 Post-traumatic stress disorder, unspecified: Secondary | ICD-10-CM | POA: Diagnosis present

## 2014-11-07 DIAGNOSIS — E669 Obesity, unspecified: Secondary | ICD-10-CM | POA: Diagnosis present

## 2014-11-07 DIAGNOSIS — I2510 Atherosclerotic heart disease of native coronary artery without angina pectoris: Secondary | ICD-10-CM | POA: Diagnosis present

## 2014-11-07 DIAGNOSIS — Z79899 Other long term (current) drug therapy: Secondary | ICD-10-CM

## 2014-11-07 DIAGNOSIS — K8050 Calculus of bile duct without cholangitis or cholecystitis without obstruction: Secondary | ICD-10-CM | POA: Diagnosis present

## 2014-11-07 DIAGNOSIS — K8001 Calculus of gallbladder with acute cholecystitis with obstruction: Secondary | ICD-10-CM

## 2014-11-07 DIAGNOSIS — Z87891 Personal history of nicotine dependence: Secondary | ICD-10-CM

## 2014-11-07 DIAGNOSIS — G8929 Other chronic pain: Secondary | ICD-10-CM | POA: Diagnosis present

## 2014-11-07 DIAGNOSIS — F419 Anxiety disorder, unspecified: Secondary | ICD-10-CM | POA: Diagnosis present

## 2014-11-07 DIAGNOSIS — M549 Dorsalgia, unspecified: Secondary | ICD-10-CM | POA: Diagnosis present

## 2014-11-07 DIAGNOSIS — M79606 Pain in leg, unspecified: Secondary | ICD-10-CM | POA: Diagnosis present

## 2014-11-07 DIAGNOSIS — J9620 Acute and chronic respiratory failure, unspecified whether with hypoxia or hypercapnia: Secondary | ICD-10-CM | POA: Diagnosis not present

## 2014-11-07 DIAGNOSIS — Z79891 Long term (current) use of opiate analgesic: Secondary | ICD-10-CM

## 2014-11-07 DIAGNOSIS — Z6835 Body mass index (BMI) 35.0-35.9, adult: Secondary | ICD-10-CM

## 2014-11-07 DIAGNOSIS — E785 Hyperlipidemia, unspecified: Secondary | ICD-10-CM | POA: Diagnosis present

## 2014-11-07 DIAGNOSIS — K8066 Calculus of gallbladder and bile duct with acute and chronic cholecystitis without obstruction: Principal | ICD-10-CM | POA: Diagnosis present

## 2014-11-07 DIAGNOSIS — Z8249 Family history of ischemic heart disease and other diseases of the circulatory system: Secondary | ICD-10-CM

## 2014-11-07 DIAGNOSIS — Z419 Encounter for procedure for purposes other than remedying health state, unspecified: Secondary | ICD-10-CM

## 2014-11-07 DIAGNOSIS — K219 Gastro-esophageal reflux disease without esophagitis: Secondary | ICD-10-CM | POA: Diagnosis present

## 2014-11-07 DIAGNOSIS — D860 Sarcoidosis of lung: Secondary | ICD-10-CM | POA: Diagnosis present

## 2014-11-07 DIAGNOSIS — Z8371 Family history of colonic polyps: Secondary | ICD-10-CM

## 2014-11-07 DIAGNOSIS — I10 Essential (primary) hypertension: Secondary | ICD-10-CM | POA: Diagnosis not present

## 2014-11-07 DIAGNOSIS — J449 Chronic obstructive pulmonary disease, unspecified: Secondary | ICD-10-CM | POA: Diagnosis not present

## 2014-11-07 DIAGNOSIS — Z800 Family history of malignant neoplasm of digestive organs: Secondary | ICD-10-CM

## 2014-11-07 DIAGNOSIS — G4733 Obstructive sleep apnea (adult) (pediatric): Secondary | ICD-10-CM | POA: Diagnosis present

## 2014-11-07 DIAGNOSIS — Z888 Allergy status to other drugs, medicaments and biological substances status: Secondary | ICD-10-CM

## 2014-11-07 DIAGNOSIS — K5790 Diverticulosis of intestine, part unspecified, without perforation or abscess without bleeding: Secondary | ICD-10-CM | POA: Diagnosis present

## 2014-11-07 DIAGNOSIS — K8020 Calculus of gallbladder without cholecystitis without obstruction: Secondary | ICD-10-CM

## 2014-11-07 DIAGNOSIS — F329 Major depressive disorder, single episode, unspecified: Secondary | ICD-10-CM | POA: Diagnosis present

## 2014-11-07 DIAGNOSIS — M1990 Unspecified osteoarthritis, unspecified site: Secondary | ICD-10-CM | POA: Diagnosis present

## 2014-11-07 DIAGNOSIS — G43909 Migraine, unspecified, not intractable, without status migrainosus: Secondary | ICD-10-CM | POA: Diagnosis present

## 2014-11-07 DIAGNOSIS — K8044 Calculus of bile duct with chronic cholecystitis without obstruction: Secondary | ICD-10-CM

## 2014-11-07 DIAGNOSIS — K819 Cholecystitis, unspecified: Secondary | ICD-10-CM | POA: Diagnosis not present

## 2014-11-07 DIAGNOSIS — Z9981 Dependence on supplemental oxygen: Secondary | ICD-10-CM

## 2014-11-07 DIAGNOSIS — Z86010 Personal history of colonic polyps: Secondary | ICD-10-CM

## 2014-11-07 DIAGNOSIS — K810 Acute cholecystitis: Secondary | ICD-10-CM | POA: Diagnosis present

## 2014-11-07 HISTORY — DX: Dependence on supplemental oxygen: Z99.81

## 2014-11-07 HISTORY — PX: ERCP: SHX5425

## 2014-11-07 HISTORY — DX: Unspecified disturbances of skin sensation: R20.9

## 2014-11-07 LAB — MRSA PCR SCREENING: MRSA by PCR: NEGATIVE

## 2014-11-07 SURGERY — ERCP, WITH INTERVENTION IF INDICATED
Anesthesia: General

## 2014-11-07 MED ORDER — CIPROFLOXACIN IN D5W 400 MG/200ML IV SOLN
INTRAVENOUS | Status: AC
  Filled 2014-11-07: qty 200

## 2014-11-07 MED ORDER — LACTATED RINGERS IV SOLN: INTRAVENOUS | Status: DC

## 2014-11-07 MED ORDER — PHENYLEPHRINE 40 MCG/ML (10ML) SYRINGE FOR IV PUSH (FOR BLOOD PRESSURE SUPPORT)
PREFILLED_SYRINGE | INTRAVENOUS | Status: AC
  Filled 2014-11-07: qty 20

## 2014-11-07 MED ORDER — ONDANSETRON HCL 4 MG/2ML IJ SOLN: 4.0000 mg | Freq: Once | INTRAMUSCULAR | Status: DC | PRN

## 2014-11-07 MED ORDER — FLUOXETINE HCL 20 MG PO CAPS
40.0000 mg | ORAL_CAPSULE | Freq: Every day | ORAL | Status: DC
  Administered 2014-11-08 – 2014-11-10 (×3): 40 mg via ORAL
  Filled 2014-11-07 (×4): qty 2

## 2014-11-07 MED ORDER — FENTANYL CITRATE (PF) 100 MCG/2ML IJ SOLN
INTRAMUSCULAR | Status: DC | PRN
  Administered 2014-11-07: 50 ug via INTRAVENOUS

## 2014-11-07 MED ORDER — ENOXAPARIN SODIUM 40 MG/0.4ML ~~LOC~~ SOLN
40.0000 mg | SUBCUTANEOUS | Status: DC
  Administered 2014-11-08 – 2014-11-09 (×2): 40 mg via SUBCUTANEOUS
  Filled 2014-11-07 (×4): qty 0.4

## 2014-11-07 MED ORDER — PROPOFOL 10 MG/ML IV BOLUS
INTRAVENOUS | Status: AC
  Filled 2014-11-07: qty 20

## 2014-11-07 MED ORDER — NEOSTIGMINE METHYLSULFATE 10 MG/10ML IV SOLN
INTRAVENOUS | Status: DC | PRN
  Administered 2014-11-07: 5 mg via INTRAVENOUS

## 2014-11-07 MED ORDER — FENTANYL CITRATE (PF) 100 MCG/2ML IJ SOLN
INTRAMUSCULAR | Status: AC
  Filled 2014-11-07: qty 2

## 2014-11-07 MED ORDER — SODIUM CHLORIDE 0.9 % IJ SOLN
3.0000 mL | Freq: Two times a day (BID) | INTRAMUSCULAR | Status: DC
  Administered 2014-11-09: 3 mL via INTRAVENOUS

## 2014-11-07 MED ORDER — INDOMETHACIN 50 MG RE SUPP
100.0000 mg | Freq: Once | RECTAL | Status: AC
  Administered 2014-11-07: 100 mg via RECTAL

## 2014-11-07 MED ORDER — GLYCOPYRROLATE 0.2 MG/ML IJ SOLN
INTRAMUSCULAR | Status: DC | PRN
  Administered 2014-11-07: .8 mg via INTRAVENOUS

## 2014-11-07 MED ORDER — IOHEXOL 350 MG/ML SOLN
INTRAVENOUS | Status: DC | PRN
  Administered 2014-11-07: 15:00:00

## 2014-11-07 MED ORDER — ONDANSETRON HCL 4 MG PO TABS: 4.0000 mg | ORAL_TABLET | Freq: Four times a day (QID) | ORAL | Status: DC | PRN

## 2014-11-07 MED ORDER — SUCCINYLCHOLINE CHLORIDE 20 MG/ML IJ SOLN
INTRAMUSCULAR | Status: DC | PRN
  Administered 2014-11-07: 100 mg via INTRAVENOUS

## 2014-11-07 MED ORDER — ACETAMINOPHEN 650 MG RE SUPP: 650.0000 mg | Freq: Four times a day (QID) | RECTAL | Status: DC | PRN

## 2014-11-07 MED ORDER — SODIUM CHLORIDE 0.9 % IV SOLN
INTRAVENOUS | Status: DC
  Administered 2014-11-07 – 2014-11-09 (×3): via INTRAVENOUS

## 2014-11-07 MED ORDER — CIPROFLOXACIN IN D5W 400 MG/200ML IV SOLN
400.0000 mg | Freq: Once | INTRAVENOUS | Status: AC
  Administered 2014-11-07: 400 mg via INTRAVENOUS

## 2014-11-07 MED ORDER — HYDROMORPHONE HCL 1 MG/ML IJ SOLN: 0.2500 mg | INTRAMUSCULAR | Status: DC | PRN

## 2014-11-07 MED ORDER — MORPHINE SULFATE 2 MG/ML IJ SOLN
2.0000 mg | INTRAMUSCULAR | Status: DC | PRN
Start: 2014-11-07 — End: 2014-11-10
  Administered 2014-11-08 – 2014-11-09 (×5): 2 mg via INTRAVENOUS
  Filled 2014-11-07 (×5): qty 1

## 2014-11-07 MED ORDER — CISATRACURIUM BESYLATE (PF) 10 MG/5ML IV SOLN
INTRAVENOUS | Status: DC | PRN
  Administered 2014-11-07: 6 mg via INTRAVENOUS

## 2014-11-07 MED ORDER — ONDANSETRON HCL 4 MG/2ML IJ SOLN
INTRAMUSCULAR | Status: AC
  Filled 2014-11-07: qty 2

## 2014-11-07 MED ORDER — CETYLPYRIDINIUM CHLORIDE 0.05 % MT LIQD
7.0000 mL | Freq: Two times a day (BID) | OROMUCOSAL | Status: DC
  Administered 2014-11-08 (×2): 7 mL via OROMUCOSAL

## 2014-11-07 MED ORDER — PHENYLEPHRINE HCL 10 MG/ML IJ SOLN
INTRAMUSCULAR | Status: DC | PRN
  Administered 2014-11-07: 160 ug via INTRAVENOUS
  Administered 2014-11-07 (×2): 120 ug via INTRAVENOUS

## 2014-11-07 MED ORDER — CHLORHEXIDINE GLUCONATE 0.12 % MT SOLN
15.0000 mL | Freq: Two times a day (BID) | OROMUCOSAL | Status: DC
  Administered 2014-11-07: 15 mL via OROMUCOSAL
  Filled 2014-11-07 (×2): qty 15

## 2014-11-07 MED ORDER — SODIUM CHLORIDE 0.9 % IV SOLN
INTRAVENOUS | Status: DC
  Administered 2014-11-07: 14:00:00 via INTRAVENOUS

## 2014-11-07 MED ORDER — ACETAMINOPHEN 325 MG PO TABS: 650.0000 mg | ORAL_TABLET | Freq: Four times a day (QID) | ORAL | Status: DC | PRN

## 2014-11-07 MED ORDER — PHENYLEPHRINE HCL 10 MG/ML IJ SOLN
10.0000 mg | INTRAVENOUS | Status: DC | PRN
  Administered 2014-11-07: 20 ug/min via INTRAVENOUS

## 2014-11-07 MED ORDER — BUDESONIDE-FORMOTEROL FUMARATE 160-4.5 MCG/ACT IN AERO
2.0000 | INHALATION_SPRAY | Freq: Two times a day (BID) | RESPIRATORY_TRACT | Status: DC
  Administered 2014-11-08 – 2014-11-09 (×3): 2 via RESPIRATORY_TRACT
  Filled 2014-11-07 (×2): qty 6

## 2014-11-07 MED ORDER — PROPOFOL 10 MG/ML IV BOLUS
INTRAVENOUS | Status: DC | PRN
  Administered 2014-11-07: 200 mg via INTRAVENOUS

## 2014-11-07 MED ORDER — ONDANSETRON HCL 4 MG/2ML IJ SOLN: 4.0000 mg | Freq: Four times a day (QID) | INTRAMUSCULAR | Status: DC | PRN

## 2014-11-07 MED ORDER — METOPROLOL TARTRATE 25 MG PO TABS: 25.0000 mg | ORAL_TABLET | Freq: Every day | ORAL | Status: DC

## 2014-11-07 MED ORDER — GLUCAGON HCL RDNA (DIAGNOSTIC) 1 MG IJ SOLR
INTRAMUSCULAR | Status: DC | PRN
  Administered 2014-11-07: .5 mg via INTRAVENOUS

## 2014-11-07 MED ORDER — LACTATED RINGERS IV SOLN
INTRAVENOUS | Status: DC | PRN
  Administered 2014-11-07: 13:00:00 via INTRAVENOUS

## 2014-11-07 MED ORDER — CISATRACURIUM BESYLATE 20 MG/10ML IV SOLN
INTRAVENOUS | Status: AC
  Filled 2014-11-07: qty 10

## 2014-11-07 MED ORDER — ONDANSETRON HCL 4 MG/2ML IJ SOLN
INTRAMUSCULAR | Status: DC | PRN
  Administered 2014-11-07: 4 mg via INTRAVENOUS

## 2014-11-07 MED ORDER — PNEUMOCOCCAL VAC POLYVALENT 25 MCG/0.5ML IJ INJ: 0.5000 mL | INJECTION | INTRAMUSCULAR | Status: DC

## 2014-11-07 MED ORDER — MEPERIDINE HCL 100 MG/ML IJ SOLN: 6.2500 mg | INTRAMUSCULAR | Status: DC | PRN

## 2014-11-07 MED ORDER — ALBUTEROL SULFATE (2.5 MG/3ML) 0.083% IN NEBU: 2.5000 mg | INHALATION_SOLUTION | RESPIRATORY_TRACT | Status: DC | PRN

## 2014-11-07 MED ORDER — HYDROCODONE-ACETAMINOPHEN 5-325 MG PO TABS
1.0000 | ORAL_TABLET | Freq: Two times a day (BID) | ORAL | Status: DC | PRN
  Administered 2014-11-07 – 2014-11-09 (×4): 1 via ORAL
  Filled 2014-11-07 (×4): qty 1

## 2014-11-07 MED ORDER — FINASTERIDE 5 MG PO TABS
5.0000 mg | ORAL_TABLET | Freq: Every evening | ORAL | Status: DC
  Administered 2014-11-07 – 2014-11-09 (×3): 5 mg via ORAL
  Filled 2014-11-07 (×4): qty 1

## 2014-11-07 MED ORDER — GLYCOPYRROLATE 0.2 MG/ML IJ SOLN
INTRAMUSCULAR | Status: AC
  Filled 2014-11-07: qty 4

## 2014-11-07 MED ORDER — PHENYLEPHRINE 40 MCG/ML (10ML) SYRINGE FOR IV PUSH (FOR BLOOD PRESSURE SUPPORT)
PREFILLED_SYRINGE | INTRAVENOUS | Status: AC
  Filled 2014-11-07: qty 10

## 2014-11-07 MED ORDER — PHENYLEPHRINE HCL 10 MG/ML IJ SOLN
INTRAMUSCULAR | Status: AC
  Filled 2014-11-07: qty 2

## 2014-11-07 MED ORDER — INDOMETHACIN 50 MG RE SUPP
RECTAL | Status: AC
  Filled 2014-11-07: qty 2

## 2014-11-07 MED ORDER — GLUCAGON HCL RDNA (DIAGNOSTIC) 1 MG IJ SOLR
INTRAMUSCULAR | Status: AC
  Filled 2014-11-07: qty 1

## 2014-11-07 NOTE — Transfer of Care (Signed)
Immediate Anesthesia Transfer of Care Note  Patient: Travis Ferguson  Procedure(s) Performed: Procedure(s): ENDOSCOPIC RETROGRADE CHOLANGIOPANCREATOGRAPHY (ERCP) (N/A)  Patient Location: PACU and Endoscopy Unit  Anesthesia Type:General  Level of Consciousness: awake, sedated and patient cooperative  Airway & Oxygen Therapy: Patient Spontanous Breathing and Patient connected to face mask oxygen  Post-op Assessment: Report given to RN and Post -op Vital signs reviewed and stable  Post vital signs: Reviewed and stable  Last Vitals:  Filed Vitals:   11/07/14 1242  BP: 134/91  Pulse: 84  Temp: 36.8 C  Resp: 18    Complications: No apparent anesthesia complications

## 2014-11-07 NOTE — H&P (View-Only) (Signed)
HISTORY OF PRESENT ILLNESS:  Travis Ferguson is a 74 y.o. male with COPD, hypertension, obesity, and depression. I saw the patient on one occasion 08/02/2014 for outpatient screening colonoscopy. He was found to have severe diverticulosis, right-sided angiodysplasia, and 4 diminutive polyps which were removed and found to be both adenomatous and hyperplastic. Patient was hospitalized late February 2016 with nausea and vomiting. He was found to have gangrenous cholecystitis for which cholecystostomy tube was placed. Additional imaging suggested choledocholithiasis. No ERCP performed due to exacerbation of COPD. Injection of contrast through cholecystostomy 4 weeks ago revealed choledocholithiasis. I was contacted by his general surgeon, Dr. Lucia Gaskins, last week regarding possible ERCP with ductal clearance of stones pre-cholecystectomy. Patient presents today with his wife. Overall he still feels poorly, though has had ongoing improvement. No further pain or vomiting. Breathing has improved. He is depressed.  REVIEW OF SYSTEMS:  All non-GI ROS negative except for sinus and allergy, anxiety, arthritis, back pain, depression, fatigue, shortness of breath, insomnia  Past Medical History  Diagnosis Date  . 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  . Depression   . PTSD (post-traumatic stress disorder)   . OSA (obstructive sleep apnea)   . Migraine   . GERD (gastroesophageal reflux disease)   . Dyslipidemia   . Rhinitis   . Cervical disc disease   . Subdural hematoma 11/10    a. 2010 s/p surgery - diagnosed several weeks after a fall.  . Hypertension   . Coronary artery calcification seen on CAT scan   . Sarcoidosis   . Atherosclerosis   . Dyspnea     chronic  . Back pain     chronic back pain/under pain management  . Inguinal hernia     right side  . Colon polyps     adenomatous and hyperplastic  . Hiatal hernia   . Sepsis   . Gallstones      Past Surgical History  Procedure Laterality Date  . Craniotomy  11/10    right frontal  . Video bronchoscopy  07/28/2012    Procedure: VIDEO BRONCHOSCOPY WITH FLUORO;  Surgeon: Chesley Mires, MD;  Location: [facility];  Service: Cardiopulmonary;  Laterality: Bilateral;  . Hernia repair Bilateral     right and left inguinal  . Knee arthroscopy Right   . Lumbar laminectomy/decompression microdiscectomy Right 12/15/2012    Procedure: LUMBAR LAMINECTOMY/DECOMPRESSION MICRODISCECTOMY 1 LEVEL;  Surgeon: Elaina Hoops, MD;  Location: [facility];  Service: Neurosurgery;  Laterality: Right;  Right Lumbar four-five laminectomy/foraminotomy  . Gallbladder surgery      drain tube placed    Social History Travis Ferguson  reports that he quit smoking about 19 years ago. His smoking use included Pipe. He has never used smokeless tobacco. He reports that he does not drink alcohol or use illicit drugs.  family history includes Colon polyps in his brother; Congestive Heart Failure in his brother; Coronary artery disease in an other family member; Heart disease in his father and mother; Stomach cancer in his paternal grandmother. There is no history of Colon cancer, Pancreatic cancer, Rectal cancer, or Esophageal cancer.  Allergies  Allergen Reactions  . Statins Other (See Comments)    Muscle aches - tolerating Vytorin       PHYSICAL EXAMINATION: Vital signs: BP 94/60 mmHg  Pulse 84  Ht '5\' 10"'$  (1.778 m)  Wt 247 lb 8 oz (112.265 kg)  BMI 35.51 kg/m2  Constitutional:  Obese, generally well-appearing, no acute distress Psychiatric: Depressed-appearing. alert and oriented x3, cooperative Eyes: extraocular movements intact, anicteric, conjunctiva pink Mouth: oral pharynx moist, no lesions Neck: supple no lymphadenopathy Cardiovascular: heart regular rate and rhythm, no murmur Lungs: clear to auscultation bilaterally Abdomen: soft, obese, nontender, nondistended, no obvious ascites, no peritoneal  signs, normal bowel sounds, no organomegaly. Cholecystostomy tube in place Rectal: Ommitted Extremities: no lower extremity edema bilaterally Skin: no lesions on visible extremities Neuro: No focal deficits.    ASSESSMENT:  #1. Acute calculus cholecystitis status post cholecystostomy tube placement February 2016. Stable #2. Choledocholithiasis #3. Significant comorbidities including COPD and obesity   PLAN:  #1. ERCP with sphincterotomy and stone extraction. The patient is high-risk given his comorbidities.The nature of the procedure, as well as the risks (acute pancreatitis, perforation, bleeding, infection), benefits, and alternatives were carefully and thoroughly reviewed with the patient. Ample time for discussion and questions allowed. The patient understood, was satisfied, and agreed to proceed. The procedure has been scheduled for Monday, May 16 at 1 PM. This will be with general anesthesia.

## 2014-11-07 NOTE — Consult Note (Addendum)
Consultation Travis Ferguson 10/20/2014 1:46 PM Location: [facility] Patient #: [id number] DOB: Dec 14, 1940 Married / Language: English / Race: White Male  History of Present Illness  The patient is a 74 year old male who presents with abdominal pain. His PCP is Dr. Jerilynn Mages. Perini. His pulm physician is Dr. Halford Chessman. His wife is with him.  [name] was hospitalized at [facility] from 08/16/2014 - 08/25/2014 for sepsis secondary to cholecystitis and exacerbation of his underlying pulmonary disease. He is following up a perc drain of his gall bladder. He had a CT scan and gall bladder study on 09/27/2014. This showed the peri gall bladder abscess had resolved. So one drained was removed and one left behind. Today I talked to him about gallbladder surgery. Unfortunately, I missed in my review of his records that he probably has common bile duct stones. Dr. Lucio Edward had seen him as a consult in the hospital. I will contact Dr. Fuller Plan about management of the stones. [Note: The patient had left my office, but I called him on the phone and reviewed this with him.  His GI physician is Dr. Perry.]  Will start planning gall bladder surgery. I discussed with the patient the indications and risks of gall bladder surgery. The primary risks of gall bladder surgery include, but are not limited to, bleeding, infection, common bile duct injury, and open surgery. There is also the risk that the patient may have continued symptoms after surgery. We discussed the typical post-operative recovery course. I tried to answer the patient's questions. Because of his history, he is at increased risk of open surgery. Will need to coordinate with GI.  History of gall bladder disease: He had a percutaneous drain of the gall bladder on 08/17/2014. He had an abscess drained on 08/24/2014. I saw him one time in the hospital, but he somehow got follow up with me. His original consult was  with Dr. Excell Seltzer on 08/16/2014. About 10-14 days prior to admission he had the onset of his illness which initially consisted mainly of severe nausea with occasional small volume emesis and diarrhea which was dark and nonbloody. At that time there had been a GI illness going through his family and they initially thought that he had caught this. However his symptoms have persisted. He is continued to have severe nausea and occasional vomiting. He does not like the drains - but is tolerating them, as long as they are not left in long term.  He had an [place] on 08/23/2014 that showed a decompressed GB with multiple stones. His MRCP on 08/24/2014 did not show CBD stones - but the study was degraded. The patient also expressed that he felt claustrophobic in the MRI machine.  As far as his lungs, he is not on O2 at home. He sees Dr. Governor Rooks later today. He does have some clear sputum that he brings up. [From Dr. Juanetta Travis Ferguson notes of 09/14/2014 - if he has surgery, then he should continue on his inhaler regimen - he will need close monitoring of his oxygen and should be monitored in an ICU setting for the initial post-operative period - pulmonary service can be called for assistance in hospital after his surgery - he does not need any additional pulmonary testing at this time]  At this time, the plan is to study his GB and the abscess at 6 weeks (the week of April 6) from the GB perc drain. The hope is to get  the drain out of the abscess. I would leave the GB drain in until surgery. Would plan for surgery about 10 to 12 weeks post perc drain of the GB (May 3 - May 17). I will see him back in the office one week after the GB and abscess studies.  Past Medical history: 1. Significant pulm disease - COPD  OSA 2. L4-L5 back surgery - 12/15/2012  Followed by Dr. Saintclair Halsted  He still has lef groin pain attributed to his back 3. Subdural hematoma - required surgery 2010  Dr. Saintclair Halsted 4. HTN 5.  Sarcoidosis 6. In the notes there is mention of right inguinal hernia 7. History of depression and PTSD  Social History: Married. Wife is with him.  Addendum Note The patient has CBD stones by his gall bladder study. It looks this diagnosis may have been unclear at the time of his discharge. I have spoken to Dr. Henrene Pastor about doing a ERCP prior to gall bladder surgery. At this time, Dr. Henrene Pastor plans to do the ERCP on 5/16 and I will do his gall bladder on 5/17. DN 10/21/2014   Problem List/Past Medical  CHOLECYSTITIS, ACUTE (575.0  K81.0) Perc drain - 08/17/2014 COPD, SEVERE (496  J44.9) Seeing Dr. Governor Rooks RIGHT UPPER QUADRANT ABDOMINAL ABSCESS [phone]  K65.1) Has perc drain since 08/24/2014  Other Problems  Arthritis Back Pain Chronic Obstructive Lung Disease High blood pressure Inguinal Hernia Migraine Headache Sleep Apnea  Past Surgical History  Open Inguinal Hernia Surgery Bilateral. Spinal Surgery - Lower Back  Diagnostic Studies History  Colonoscopy within last year  Allergies (Ammie Eversole, LPN; 0/03/9322 5:57 PM) Statins  Medication History (Ammie Eversole, LPN; 09/12/252 2:70 PM) Hydrocodone-Acetaminophen (5-'325MG'$  Tablet, Oral) Active. Levalbuterol Active. Losartan Potassium-HCTZ (100-12.'5MG'$  Tablet, Oral) Active. TraMADol HCl ('50MG'$  Tablet Disperse, Oral) Active. TraZODone HCl ('100MG'$  Tablet, Oral) Active. Simvastatin ('20MG'$  Tablet, Oral) Active. ProAir HFA (108 (90 Base)MCG/ACT Aerosol Soln, Inhalation) Active. Finasteride ('5MG'$  Tablet, Oral) Active. Metoprolol Tartrate ('25MG'$  Tablet, Oral) Active. Budesonide-Formoterol Fumarate (160-4.5MCG/ACT Aerosol, Inhalation) Active. FLUoxetine HCl ('40MG'$  Capsule, Oral) Active.  Social History  Caffeine use Carbonated beverages, Coffee, Tea. No alcohol use No drug use Tobacco use Former smoker.  Family History  Arthritis Brother, Father, Mother. Heart Disease Brother,  Father. Heart disease in male family member before age 64 Hypertension Brother, Father, Mother. Migraine Headache Son. Respiratory Condition Son.  Review of Systems  General Present- Fatigue. Not Present- Appetite Loss, Chills, Fever, Night Sweats, Weight Gain and Weight Loss. Skin Not Present- Change in Wart/Mole, Dryness, Hives, Jaundice, New Lesions, Non-Healing Wounds, Rash and Ulcer. HEENT Present- Wears glasses/contact lenses. Not Present- Earache, Hearing Loss, Hoarseness, Nose Bleed, Oral Ulcers, Ringing in the Ears, Seasonal Allergies, Sinus Pain, Sore Throat, Visual Disturbances and Yellow Eyes. Respiratory Present- Chronic Cough, Difficulty Breathing, Snoring and Wheezing. Not Present- Bloody sputum. Breast Not Present- Breast Mass, Breast Pain, Nipple Discharge and Skin Changes. Cardiovascular Present- Shortness of Breath and Swelling of Extremities. Not Present- Chest Pain, Difficulty Breathing Lying Down, Leg Cramps, Palpitations and Rapid Heart Rate. Male Genitourinary Present- Frequency and Urgency. Not Present- Blood in Urine, Change in Urinary Stream, Impotence, Nocturia, Painful Urination and Urine Leakage. Musculoskeletal Present- Back Pain and Joint Pain. Not Present- Joint Stiffness, Muscle Pain, Muscle Weakness and Swelling of Extremities. Neurological Present- Trouble walking and Weakness. Not Present- Decreased Memory, Fainting, Headaches, Numbness, Seizures, Tingling and Tremor. Psychiatric Present- Anxiety. Not Present- Bipolar, Change in Sleep Pattern, Depression, Fearful and Frequent crying. Hematology Present- Easy Bruising. Not Present- Excessive  bleeding, Gland problems, HIV and Persistent Infections.   Vitals (Ammie Eversole LPN; 6/33/3545 6:25 PM) 10/20/2014 1:47 PM Weight: 247.6 lb Height: 72in Body Surface Area: 2.39 m Body Mass Index: 33.58 kg/m Pulse: 80 (Regular)  BP: 150/78 (Sitting, Left Arm, Standard)  Physical Exam  General: WN  older WM alert. Uses cane to walk. HEENT: Normal. Pupils equal.  Neck: Supple. No mass. No thyroid mass.  Lymph Nodes: No supraclavicular or cervical nodes.  Lungs: Clear to auscultation and symmetric breath sounds. No wheezing at this time. Heart: RRR. No murmur or rub.  Abdomen: Soft. No mass. No tenderness. No hernia. Normal bowel sounds.  Now has one drain in RUQ. Bile in bag.   Blood in bile bag tonight post ERCP  Extremities: He uses a cane to get around, but is able to get on the exam table by himself.  Neurologic: Grossly intact to motor and sensory function.   Assessment & Plan  1.  CHOLECYSTITIS, ACUTE (575.0  K81.0)  Story: Perc drain of gall bladder - 08/17/2014  Coordinated with GI - Dr. Henrene Pastor.  Current Plans   Schedule tomorrow (11/08/2014) for cholecystectomy and cholangiogram.  2.  CHOLEDOCHOLITHIASIS (574.50  K80.50)  Appears to have had a successful ERCP with sphincterotomy by Dr. Rikki Spearing today (11/07/2014). 3.  COPD, SEVERE (496  J44.9)  Impression: notes on 09/14/2014 from Dr. Halford Chessman.   He needs no furthr evaluation before surgery. 4.  RIGHT UPPER QUADRANT ABDOMINAL ABSCESS (567.22  K65.1)  Story: Has perc drain since 08/24/2014   Drain removed 09/27/2014 5.  Depression - His chronic back and leg pain are getting to him  He has talked about PT 6.  Chronic back and leg pain   This has been a significant problem for him  He is followed by Dr. Henrine Screws 7.  Chronic hydrocodone use  He takes 2 tabs per day for his back and leg pain.  Alphonsa Overall, MD, [facility] Pager: [phone] Office phone:  [phone]

## 2014-11-07 NOTE — Progress Notes (Signed)
     [facility] Progress Note  Subjective:   Travis Ferguson is a 74 year old male known to Travis Ferguson with COPD, hypertension, obesity, and depression. He had a colonoscopy as an outpatient in February 2016 and was found to have severe diverticulosis, right sided angiodysplasia, and 4 diminutive polyps which were removed and found to be both adenomatous and hyperplastic. He was hospitalized February 2016 with nausea and vomiting at which time he was found to have gangrenous cholecystitis for which a cholecystostomy tube was placed. No ERCP performed due to exacerbation of COPD. Additional imaging suggested choledocholithiasis. Patient was evaluated by Dr. Lucia Gaskins of general surgery and then by Travis Ferguson regarding possible ERCP with ductal clearance of stones pre-cholecystectomy.  Patient underwent an ERCP today by Travis Ferguson with stone extraction. He is to be admitted to the hospitalist service and will undergo cholecystectomy with Dr. Lucia Gaskins tomorrow.     Dg Ercp Biliary & Pancreatic Ducts  11/07/2014   CLINICAL DATA:  Cholelithiasis  EXAM: ERCP  TECHNIQUE: Multiple spot images obtained with the fluoroscopic device and submitted for interpretation post-procedure.  FLUOROSCOPY TIME:  5 minutes 33 seconds  COMPARISON:  Cholecystostomy catheter injection 09/27/2014 and earlier studies  FINDINGS: A series of images documents endoscopic catheterization of the CBD. There is opacification of the lumen the gallbladder with multiple filling defects consistent with stones. Percutaneous pigtail drain catheter stable in position. Later images document opacification of the CBD. CBD is nondilated. Partially occlusive filling defects in the distal CBD. Incomplete opacification of intrahepatic bile ducts, which appear decompressed centrally.  IMPRESSION: 1. Cholelithiasis and choledocholithiasis, without evidence of biliary ductal dilatation. These images were submitted for radiologic interpretation only. Please  see the procedural report for the amount of contrast and the fluoroscopy time utilized.   Electronically Signed   By: Lucrezia Europe M.D.   On: 11/07/2014 15:09    Procedures:IMPRESSIONS: 1. Choledocholithiasis status post ERCP with common bile duct stone extraction 2. Previous cholecystitis with cholelithiasis and indwelling cholecystostomy tube RECOMMENDATIONS: 1. Admit to hospitalists service. They have been called and are aware 2. Surgery tomorrow with Dr. Lucia Gaskins   Hvozdovic, Vita Barley PA-C 11/07/2014, Pager [phone]

## 2014-11-07 NOTE — Op Note (Signed)
[facility] [place], [address]   ERCP PROCEDURE REPORT        EXAM DATE: 11/07/2014  PATIENT NAME:          Travis Ferguson, Travis Ferguson          MR #: [id number] BIRTHDATE:       Dec 28, 1940     VISIT #:     [phone] ATTENDING:     Eustace Quail, MD     STATUS:     outpatient ASSISTANT:      Cleda Daub and Corliss Parish  INDICATIONS:  The patient is a 74 yr old male here for an ERCP due to established bile duct stone(s). PROCEDURE PERFORMED:     ERCP with sphincterotomy/papillotomy ERCP with removal of calculus/calculi MEDICATIONS:     Per Anesthesia  CONSENT: The patient understands the risks and benefits of the procedure and understands that these risks include, but are not limited to: sedation, allergic reaction, infection, perforation and/or bleeding. Alternative means of evaluation and treatment include, among others: physical exam, x-rays, and/or surgical intervention. The patient elects to proceed with this endoscopic procedure.  DESCRIPTION OF PROCEDURE: During intra-op preparation period all mechanical & medical equipment was checked for proper function. Hand hygiene and appropriate measures for infection prevention was taken. After the risks, benefits and alternatives of the procedure were thoroughly explained, Informed was verified, confirmed and timeout was successfully executed by the treatment team. With the patient in left semi-prone position, medications were administered intravenously.The pentax [phone] was passed from the mouth into the esophagus and further advanced from the esophagus into the stomach. From stomach scope was directed to the second portion of the duodenum.  Major papilla was aligned with the duodenoscope. The scope position was confirmed fluoroscopically. Rest of the findings/therapeutics are given below. The scope was then completely withdrawn from the patient and the procedure completed. The pulse, BP,  and O2 saturation were monitored and documented by the physician and the nursing staff throughout the entire procedure. The patient was cared for as planned according to standard protocol. The patient was then discharged to recovery in stable condition and with appropriate post procedure care. Estimated blood loss is zero unless otherwise noted in this procedure report.  The Pentax side-viewing scope was passed blindly into the esophagus. Stomach was normal.  Duodenum was normal.  The major ampulla was positioned the inside the sidewall of a large periampullary diverticulum.  X-ray: Contrast was injected into the cholecystostomy tube in an effort to opacify the bile duct. Filling of the gallbladder noted but not good filling of the bile duct.Initial injection of contrast from the major ampullae yielded a normal partial pancreatogram.  The common bile duct was then selectively and deeply cannulated with a hydrophilic wire. Injection of contrast yielded a small biliary system with a common duct measuring about 3 mm.  Several small filling defects noted. THERAPY: Biliary sphincterotomy was made cutting over the guidewire in the 12:00 orientation.  An underinflated balloon was used to remove 2 black pigmented stones. Post extraction occlusion cholangiogram was negative for residual filling defects.    ADVERSE EVENT:     There were no complications. IMPRESSIONS:     1. Choledocholithiasis status post ERCP with common bile duct stone extraction 2. Previous cholecystitis with cholelithiasis and indwelling cholecystostomy tube   RECOMMENDATIONS:     1.  Admit to hospitalists service. They have been called and are aware 2.  Surgery  tomorrow with Dr.  Newman REPEAT EXAM:   ___________________________________ Eustace Quail, MD eSigned:  Eustace Quail, MD 11/07/2014 2:59 PM   cc: Alphonsa Overall, M.D.  CPT CODES:     1.  [id number] Endoscopic retrograde cholangiopancreatography  (ERCP); w/ sphincterotomy/papillotomy 2.  43264 Endoscopic retrograde cholangiopancreatography (ERCP); w/ endoscopic retrograde removal of calculus/calculi from biliary and/or pancreatic ducts ICD9 CODES:  The ICD and CPT codes recommended by this software are interpretations from the data that the clinical staff has captured with the software.  The verification of the translation of this report to the ICD and CPT codes and modifiers is the sole responsibility of the health care institution and practicing physician where this report was generated.  [employer]. will not be held responsible for the validity of the ICD and CPT codes included on this report.  AMA assumes no liability for data contained or not contained herein. CPT is a Designer, television/film set of the [employer].   PATIENT NAME:  Travis Ferguson, Travis Ferguson MR#: [id number]

## 2014-11-07 NOTE — Anesthesia Preprocedure Evaluation (Addendum)
Anesthesia Evaluation  Patient identified by MRN, date of birth, ID band Patient awake    Reviewed: Allergy & Precautions, NPO status , Patient's Chart, lab work & pertinent test results  Airway Mallampati: II  TM Distance: >3 FB Neck ROM: Full    Dental no notable dental hx.    Pulmonary shortness of breath, sleep apnea , pneumonia -, resolved, COPD oxygen dependent, former smoker,  breath sounds clear to auscultation  Pulmonary exam normal       Cardiovascular hypertension, Pt. on medications and Pt. on home beta blockers + CAD, + Peripheral Vascular Disease and +CHF Normal cardiovascular examRhythm:Regular Rate:Normal     Neuro/Psych  Headaches, PSYCHIATRIC DISORDERS Anxiety Depression  Neuromuscular disease    GI/Hepatic negative GI ROS, Neg liver ROS, hiatal hernia, GERD-  ,  Endo/Other  negative endocrine ROS  Renal/GU Renal disease  negative genitourinary   Musculoskeletal  (+) Arthritis -,   Abdominal (+) + obese,   Peds negative pediatric ROS (+)  Hematology negative hematology ROS (+)   Anesthesia Other Findings   Reproductive/Obstetrics negative OB ROS                            Anesthesia Physical Anesthesia Plan  ASA: III  Anesthesia Plan: General   Post-op Pain Management:    Induction: Intravenous  Airway Management Planned: Oral ETT  Additional Equipment:   Intra-op Plan:   Post-operative Plan: Possible Post-op intubation/ventilation  Informed Consent: I have reviewed the patients History and Physical, chart, labs and discussed the procedure including the risks, benefits and alternatives for the proposed anesthesia with the patient or authorized representative who has indicated his/her understanding and acceptance.   Dental advisory given  Plan Discussed with: CRNA  Anesthesia Plan Comments: (At risk for pulmonary complications.)       Anesthesia Quick  Evaluation

## 2014-11-07 NOTE — Interval H&P Note (Signed)
History and Physical Interval Note:  11/07/2014 12:35 PM  Travis Ferguson  has presented today for surgery, with the diagnosis of gallstones  The various methods of treatment have been discussed with the patient and family. After consideration of risks, benefits and other options for treatment, the patient has consented to  Procedure(s): ENDOSCOPIC RETROGRADE CHOLANGIOPANCREATOGRAPHY (ERCP) (N/A) as a surgical intervention .  The patient's history has been reviewed, patient examined, no change in status, stable for surgery.  I have reviewed the patient's chart and labs.  Questions were answered to the patient's satisfaction.     Scarlette Shorts

## 2014-11-07 NOTE — Progress Notes (Signed)
[facility]  Patient Status: Active (receiving services up to time of hospitalization)  [facility] is providing the following services: RN  If patient discharges after hours, please call [phone].   Lurlean Leyden 11/07/2014, 4:46 PM

## 2014-11-07 NOTE — Progress Notes (Signed)
Pt refused nocturnal CPAP.  Pt states that he has never used CPAP QHS and that instead, per his MD, he uses nasal cannula at night.  RT will continue to monitor.

## 2014-11-07 NOTE — Anesthesia Procedure Notes (Signed)
Procedure Name: Intubation Date/Time: 11/07/2014 1:38 PM Performed by: Johnathan Hausen A Pre-anesthesia Checklist: Patient being monitored, Suction available, Emergency Drugs available, Patient identified and Timeout performed Patient Re-evaluated:Patient Re-evaluated prior to inductionOxygen Delivery Method: Circle system utilized Preoxygenation: Pre-oxygenation with 100% oxygen Intubation Type: Combination inhalational/ intravenous induction Ventilation: Mask ventilation without difficulty Laryngoscope Size: Mac and 4 Grade View: Grade I Tube type: Oral Tube size: 8.0 mm Number of attempts: 1 Airway Equipment and Method: Stylet Placement Confirmation: ETT inserted through vocal cords under direct vision,  positive ETCO2 and breath sounds checked- equal and bilateral Secured at: 21 cm Tube secured with: Tape Dental Injury: Teeth and Oropharynx as per pre-operative assessment

## 2014-11-07 NOTE — Anesthesia Preprocedure Evaluation (Addendum)
Anesthesia Evaluation  Patient identified by MRN, date of birth, ID band Patient awake    Reviewed: Allergy & Precautions, NPO status , Patient's Chart, lab work & pertinent test results  Airway Mallampati: II  TM Distance: >3 FB Neck ROM: Full    Dental   Pulmonary shortness of breath and with exertion, COPDformer smoker,    Pulmonary exam normal       Cardiovascular hypertension, Pt. on medications Normal cardiovascular exam    Neuro/Psych Anxiety Depression    GI/Hepatic GERD-  Medicated and Controlled,  Endo/Other    Renal/GU      Musculoskeletal   Abdominal   Peds  Hematology   Anesthesia Other Findings   Reproductive/Obstetrics                           Anesthesia Physical Anesthesia Plan  ASA: III  Anesthesia Plan: General   Post-op Pain Management:    Induction: Intravenous  Airway Management Planned: Oral ETT  Additional Equipment:   Intra-op Plan:   Post-operative Plan: Extubation in OR  Informed Consent: I have reviewed the patients History and Physical, chart, labs and discussed the procedure including the risks, benefits and alternatives for the proposed anesthesia with the patient or authorized representative who has indicated his/her understanding and acceptance.     Plan Discussed with: CRNA and Surgeon  Anesthesia Plan Comments:         Anesthesia Quick Evaluation

## 2014-11-07 NOTE — H&P (Signed)
[facility] History and Physical  Travis Ferguson PPI:[id number] DOB: 1940-11-22 DOA: 11/07/2014  Referring physician:  PCP: Jerlyn Ly, MD  Specialists: GI  Chief Complaint: post ERCP   HPI: Travis Ferguson is a 74 y.o. male with PMH of Depression, HTN, OSA (does not use CPAP), COPD, Chronic Respiratory Failure on Home O2, h/o recent gangrenous cholecystitis s/p cholecystostomy tube (07/2014) who underwent ERCP ductal clearance of stones pre-cholecystectomy today by Dr. Henrene Pastor. Patient is planned for cholecystectomy on 5/17. hospitalist is called for admission, evaluation due to chronic COPD -Patient denies acute chest pains, no acute SOB, he reports some DOE, chronic SOB with no recent change. No nausea, vomiting or diarrhea. No dizziness, no leg edema, no exertional chest pains.   Review of Systems: The patient denies anorexia, fever, weight loss,, vision loss, decreased hearing, hoarseness, chest pain, syncope, dyspnea on exertion, peripheral edema, balance deficits, hemoptysis, abdominal pain, melena, hematochezia, severe indigestion/heartburn, hematuria, incontinence, genital sores, muscle weakness, suspicious skin lesions, transient blindness, difficulty walking, depression, unusual weight change, abnormal bleeding, enlarged lymph nodes, angioedema, and breast masses.    Past Medical History  Diagnosis Date  . 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  . Depression   . PTSD (post-traumatic stress disorder)   . Migraine   . GERD (gastroesophageal reflux disease)   . Dyslipidemia   . Rhinitis   . Cervical disc disease   . Subdural hematoma 11/10    a. 2010 s/p surgery - diagnosed several weeks after a fall.  . Hypertension   . Coronary artery calcification seen on CAT scan   . Sarcoidosis   . Atherosclerosis   . Dyspnea     chronic  . Back pain     chronic back pain/under pain management  . Inguinal hernia     right side   . Colon polyps     adenomatous and hyperplastic  . Sepsis   . Gallstones   . Sensation problem     right side-lateral hip to knee" decreased sensation and tingling feeling" -has informed Dr. Joylene Draft, Dr. Henrene Pastor, Dr. Lucia Gaskins of this.  . Encounter for biliary drainage tube placement     remains with drainage bag to right side for  biliary drainage.  . Sinus congestion     10-27-14 at present some issues-"not bad"  . History of oxygen administration     @ 2 l/m nasally at bedtime.  . Hiatal hernia     sensation problem ("right lateral side hip to knee").  . OSA (obstructive sleep apnea)     does not use CPAP  . Pneumonia     age 60 or 34   . Anxiety   . Frequent urination   . Arthritis   . Chronic bronchitis    Past Surgical History  Procedure Laterality Date  . Craniotomy  11/10    right frontal"hemorrhage evacuation from fall injury"  . Video bronchoscopy  07/28/2012    Procedure: VIDEO BRONCHOSCOPY WITH FLUORO;  Surgeon: Chesley Mires, MD;  Location: [facility];  Service: Cardiopulmonary;  Laterality: Bilateral;  . Hernia repair Bilateral     right and left inguinal  . Knee arthroscopy Right   . Lumbar laminectomy/decompression microdiscectomy Right 12/15/2012    Procedure: LUMBAR LAMINECTOMY/DECOMPRESSION MICRODISCECTOMY 1 LEVEL;  Surgeon: Elaina Hoops, MD;  Location: [facility];  Service: Neurosurgery;  Laterality: Right;  Right Lumbar four-five laminectomy/foraminotomy  . Gallbladder surgery  drain tube placed   Social History:  reports that he quit smoking about 6 years ago. His smoking use included Pipe. He has never used smokeless tobacco. He reports that he does not drink alcohol or use illicit drugs. Home;  where does patient live--home, ALF, SNF? and with whom if at home? Yes;  Can patient participate in ADLs?  Allergies  Allergen Reactions  . Statins Other (See Comments)    Muscle aches - tolerating Vytorin    Family History  Problem Relation Age of Onset  .  Coronary artery disease    . Colon cancer Neg Hx   . Pancreatic cancer Neg Hx   . Rectal cancer Neg Hx   . Esophageal cancer Neg Hx   . Stomach cancer Paternal Grandmother   . Heart disease Mother   . Heart disease Father   . Colon polyps Brother   . Congestive Heart Failure Brother     (be sure to complete)  Prior to Admission medications   Medication Sig Start Date End Date Taking? Authorizing Provider  amoxicillin-clavulanate (AUGMENTIN) 875-125 MG per tablet Take 1 tablet by mouth 2 (two) times daily. 10/27/14  Yes Deneise Lever, MD  budesonide-formoterol (SYMBICORT) 160-4.5 MCG/ACT inhaler Inhale 2 puffs into the lungs 2 (two) times daily. 05/11/12  Yes Tammy S Parrett, NP  finasteride (PROSCAR) 5 MG tablet Take 1 tablet by mouth every evening.  01/07/13  Yes Historical Provider, MD  FLUoxetine (PROZAC) 40 MG capsule Take 40 mg by mouth daily with breakfast.    Yes Historical Provider, MD  HYDROcodone-acetaminophen (NORCO/VICODIN) 5-325 MG per tablet Take 2 tablets by mouth 2 (two) times daily as needed (for pain). Patient taking differently: Take 2 tablets by mouth every evening.  08/25/14  Yes Nita Sells, MD  losartan-hydrochlorothiazide (HYZAAR) 50-12.5 MG per tablet Take 0.5 tablets by mouth every morning.  09/04/14  Yes Historical Provider, MD  metoprolol tartrate (LOPRESSOR) 25 MG tablet Take 25 mg by mouth daily with breakfast.  04/06/12  Yes Historical Provider, MD  Multiple Vitamin (MULTIVITAMIN) tablet Take 1 tablet by mouth daily.   Yes Historical Provider, MD  OXYGEN Inhale 2 L/min into the lungs at bedtime.   Yes Historical Provider, MD  simvastatin (ZOCOR) 20 MG tablet Take 1 tablet by mouth every evening.  04/10/14  Yes Historical Provider, MD  sodium chloride (OCEAN) 0.65 % SOLN nasal spray Place 1-2 sprays into both nostrils daily as needed for congestion.   Yes Historical Provider, MD  traZODone (DESYREL) 100 MG tablet Take 1 tablet (100 mg total) by mouth at  bedtime. 08/25/14  Yes Nita Sells, MD  PROAIR HFA 108 (90 BASE) MCG/ACT inhaler 1-2 PUFFS EVERY 4-6 HOURS AS NEEDED Patient taking differently: Inhale 1-2 puffs every 4 to 6 hours as needed for shortness of breath 02/21/14   Chesley Mires, MD   Physical Exam: Filed Vitals:   11/07/14 1600  BP: 138/92  Pulse: 87  Temp: 97.5 F (36.4 C)  Resp: 19     General:  Alert, oriented   Eyes: eom-i, perrla   ENT: no oral ulcers   Neck: supple, no JVD  Cardiovascular: s1,s2 rrr  Respiratory: CTA BL   Abdomen: soft, nt,nd, cholecystectomy tube present   Skin: no rash   Musculoskeletal: no leg edema   Psychiatric: no hallucinations   Neurologic: CN 2-12 intact, motor 5/5 intact    Labs on Admission:  Basic Metabolic Panel:  Recent Labs Lab 11/03/14 1420  NA 135  K 3.8  CL 110  CO2 21*  GLUCOSE 102*  BUN 14  CREATININE 0.86  CALCIUM 9.2   Liver Function Tests: No results for input(s): AST, ALT, ALKPHOS, BILITOT, PROT, ALBUMIN in the last 168 hours. No results for input(s): LIPASE, AMYLASE in the last 168 hours. No results for input(s): AMMONIA in the last 168 hours. CBC:  Recent Labs Lab 11/03/14 1420  WBC 9.1  HGB 13.6  HCT 41.2  MCV 82.7  PLT 285   Cardiac Enzymes: No results for input(s): CKTOTAL, CKMB, CKMBINDEX, TROPONINI in the last 168 hours.  BNP (last 3 results)  Recent Labs  08/19/14 1040  BNP 460.9*    ProBNP (last 3 results)  Recent Labs  03/17/14 1305  PROBNP 235.0*    CBG: No results for input(s): GLUCAP in the last 168 hours.  Radiological Exams on Admission: Dg Ercp Biliary & Pancreatic Ducts  11/07/2014   CLINICAL DATA:  Cholelithiasis  EXAM: ERCP  TECHNIQUE: Multiple spot images obtained with the fluoroscopic device and submitted for interpretation post-procedure.  FLUOROSCOPY TIME:  5 minutes 33 seconds  COMPARISON:  Cholecystostomy catheter injection 09/27/2014 and earlier studies  FINDINGS: A series of images  documents endoscopic catheterization of the CBD. There is opacification of the lumen the gallbladder with multiple filling defects consistent with stones. Percutaneous pigtail drain catheter stable in position. Later images document opacification of the CBD. CBD is nondilated. Partially occlusive filling defects in the distal CBD. Incomplete opacification of intrahepatic bile ducts, which appear decompressed centrally.  IMPRESSION: 1. Cholelithiasis and choledocholithiasis, without evidence of biliary ductal dilatation. These images were submitted for radiologic interpretation only. Please see the procedural report for the amount of contrast and the fluoroscopy time utilized.   Electronically Signed   By: Lucrezia Europe M.D.   On: 11/07/2014 15:09    EKG: Independently reviewed.   Assessment/Plan Active Problems:   Bile duct stone  74 y.o. male with PMH of Depression, HTN, OSA (does not use CPAP), COPD, Chronic Respiratory Failure on Home O2, h/o recent gangrenous cholecystitis s/p cholecystostomy tube (07/2014) who underwent ERCP ductal clearance of stones pre-cholecystectomy today by Dr. Henrene Pastor.  -Patient is planned for cholecystectomy on 5/17. hospitalist is called for admission, evaluation due to chronic COPD  1. h/o recent gangrenous cholecystitis s/p cholecystostomy tube (07/2014)  -s/p ERCP with common bile duct stone extraction. Per surgery: scheduled for cholecystectomy on 5/17.  2. COPD, chronic respiratory failure on home oxygen. No exacerbation. No cardiopulmonary symptoms. No wheezing. Recent CXR (5/12): no acute findings.  -cont home regimen, bronchodilators prn. Cont oxygen  3. HTN. Will cont BB. Hold diuretics while on IVF.  4. OSA. Start CPAP qhs. periop  Preop eval: chronic medical condition are stable. No acute cardiopulmonary symptoms. Echo (07/2014): LVEF 50-55%.  RCRI-low risk. But needs close monitor periop due to COPD/OSA. Monitor on tele->ECG prolonged QT (will hold  trazadone) -patient is at acceptable risk for upcoming surgery    Surgery, GI. if consultant consulted, please document name and whether formally or informally consulted  Code Status: full (must indicate code status--if unknown or must be presumed, indicate so) Family Communication: d/w patient, his wife (indicate person spoken with, if applicable, with phone number if by telephone) Disposition Plan: home pend surgery  (indicate anticipated LOS)  Time spent: >45 minutes   [place], [facility] Pager [phone]  If 7PM-7AM, please contact night-coverage [email or web address] Password [facility] 11/07/2014, 4:41 PM

## 2014-11-07 NOTE — Anesthesia Postprocedure Evaluation (Signed)
Anesthesia Post Note  Patient: Travis Ferguson  Procedure(s) Performed: Procedure(s) (LRB): ENDOSCOPIC RETROGRADE CHOLANGIOPANCREATOGRAPHY (ERCP) (N/A)  Anesthesia type: general  Patient location: PACU  Post pain: Pain level controlled  Post assessment: Patient's Cardiovascular Status Stable  Last Vitals:  Filed Vitals:   11/07/14 1500  BP: 130/75  Pulse: 91  Temp:   Resp: 17    Post vital signs: Reviewed and stable  Level of consciousness: sedated  Complications: No apparent anesthesia complications. Will monitor in stepdown unit overnight.

## 2014-11-08 ENCOUNTER — Encounter (HOSPITAL_COMMUNITY): Payer: Self-pay | Admitting: Anesthesiology

## 2014-11-08 ENCOUNTER — Observation Stay (HOSPITAL_COMMUNITY): Payer: Medicare Other | Admitting: Anesthesiology

## 2014-11-08 ENCOUNTER — Encounter (HOSPITAL_COMMUNITY)
Admission: RE | Disposition: A | Discharge status: Home / Self Care | Payer: Self-pay | Source: Ambulatory Visit | Attending: Internal Medicine

## 2014-11-08 ENCOUNTER — Inpatient Hospital Stay (HOSPITAL_COMMUNITY): Admission: RE | Admit: 2014-11-08 | Payer: Medicare Other | Source: Ambulatory Visit | Admitting: Surgery

## 2014-11-08 ENCOUNTER — Observation Stay (HOSPITAL_COMMUNITY): Payer: Medicare Other

## 2014-11-08 DIAGNOSIS — K5790 Diverticulosis of intestine, part unspecified, without perforation or abscess without bleeding: Secondary | ICD-10-CM | POA: Diagnosis present

## 2014-11-08 DIAGNOSIS — Z800 Family history of malignant neoplasm of digestive organs: Secondary | ICD-10-CM | POA: Diagnosis not present

## 2014-11-08 DIAGNOSIS — Z79891 Long term (current) use of opiate analgesic: Secondary | ICD-10-CM | POA: Diagnosis not present

## 2014-11-08 DIAGNOSIS — Z8371 Family history of colonic polyps: Secondary | ICD-10-CM | POA: Diagnosis not present

## 2014-11-08 DIAGNOSIS — M1990 Unspecified osteoarthritis, unspecified site: Secondary | ICD-10-CM | POA: Diagnosis present

## 2014-11-08 DIAGNOSIS — F329 Major depressive disorder, single episode, unspecified: Secondary | ICD-10-CM | POA: Diagnosis present

## 2014-11-08 DIAGNOSIS — K8066 Calculus of gallbladder and bile duct with acute and chronic cholecystitis without obstruction: Secondary | ICD-10-CM | POA: Diagnosis present

## 2014-11-08 DIAGNOSIS — D860 Sarcoidosis of lung: Secondary | ICD-10-CM | POA: Diagnosis present

## 2014-11-08 DIAGNOSIS — Z86010 Personal history of colonic polyps: Secondary | ICD-10-CM | POA: Diagnosis not present

## 2014-11-08 DIAGNOSIS — G43909 Migraine, unspecified, not intractable, without status migrainosus: Secondary | ICD-10-CM | POA: Diagnosis present

## 2014-11-08 DIAGNOSIS — J310 Chronic rhinitis: Secondary | ICD-10-CM | POA: Diagnosis present

## 2014-11-08 DIAGNOSIS — Z8249 Family history of ischemic heart disease and other diseases of the circulatory system: Secondary | ICD-10-CM | POA: Diagnosis not present

## 2014-11-08 DIAGNOSIS — K8051 Calculus of bile duct without cholangitis or cholecystitis with obstruction: Secondary | ICD-10-CM | POA: Diagnosis not present

## 2014-11-08 DIAGNOSIS — J9620 Acute and chronic respiratory failure, unspecified whether with hypoxia or hypercapnia: Secondary | ICD-10-CM | POA: Diagnosis not present

## 2014-11-08 DIAGNOSIS — F4310 Post-traumatic stress disorder, unspecified: Secondary | ICD-10-CM | POA: Diagnosis present

## 2014-11-08 DIAGNOSIS — I10 Essential (primary) hypertension: Secondary | ICD-10-CM | POA: Diagnosis present

## 2014-11-08 DIAGNOSIS — M79606 Pain in leg, unspecified: Secondary | ICD-10-CM | POA: Diagnosis present

## 2014-11-08 DIAGNOSIS — M549 Dorsalgia, unspecified: Secondary | ICD-10-CM | POA: Diagnosis present

## 2014-11-08 DIAGNOSIS — E785 Hyperlipidemia, unspecified: Secondary | ICD-10-CM | POA: Diagnosis present

## 2014-11-08 DIAGNOSIS — Z6835 Body mass index (BMI) 35.0-35.9, adult: Secondary | ICD-10-CM | POA: Diagnosis not present

## 2014-11-08 DIAGNOSIS — G4733 Obstructive sleep apnea (adult) (pediatric): Secondary | ICD-10-CM | POA: Diagnosis present

## 2014-11-08 DIAGNOSIS — Z9981 Dependence on supplemental oxygen: Secondary | ICD-10-CM | POA: Diagnosis not present

## 2014-11-08 DIAGNOSIS — Z87891 Personal history of nicotine dependence: Secondary | ICD-10-CM | POA: Diagnosis not present

## 2014-11-08 DIAGNOSIS — I2510 Atherosclerotic heart disease of native coronary artery without angina pectoris: Secondary | ICD-10-CM | POA: Diagnosis present

## 2014-11-08 DIAGNOSIS — J449 Chronic obstructive pulmonary disease, unspecified: Secondary | ICD-10-CM | POA: Diagnosis present

## 2014-11-08 DIAGNOSIS — G8929 Other chronic pain: Secondary | ICD-10-CM | POA: Diagnosis present

## 2014-11-08 DIAGNOSIS — Z79899 Other long term (current) drug therapy: Secondary | ICD-10-CM | POA: Diagnosis not present

## 2014-11-08 DIAGNOSIS — Z888 Allergy status to other drugs, medicaments and biological substances status: Secondary | ICD-10-CM | POA: Diagnosis not present

## 2014-11-08 DIAGNOSIS — E669 Obesity, unspecified: Secondary | ICD-10-CM | POA: Diagnosis present

## 2014-11-08 DIAGNOSIS — K219 Gastro-esophageal reflux disease without esophagitis: Secondary | ICD-10-CM | POA: Diagnosis present

## 2014-11-08 DIAGNOSIS — F419 Anxiety disorder, unspecified: Secondary | ICD-10-CM | POA: Diagnosis present

## 2014-11-08 DIAGNOSIS — K810 Acute cholecystitis: Secondary | ICD-10-CM | POA: Diagnosis not present

## 2014-11-08 HISTORY — PX: CHOLECYSTECTOMY: SHX55

## 2014-11-08 SURGERY — LAPAROSCOPIC CHOLECYSTECTOMY WITH INTRAOPERATIVE CHOLANGIOGRAM
Anesthesia: General | Site: Abdomen

## 2014-11-08 MED ORDER — LIDOCAINE HCL (CARDIAC) 20 MG/ML IV SOLN
INTRAVENOUS | Status: DC | PRN
  Administered 2014-11-08: 100 mg via INTRAVENOUS

## 2014-11-08 MED ORDER — CISATRACURIUM BESYLATE (PF) 10 MG/5ML IV SOLN
INTRAVENOUS | Status: DC | PRN
  Administered 2014-11-08: 8 mg via INTRAVENOUS
  Administered 2014-11-08 (×4): 2 mg via INTRAVENOUS

## 2014-11-08 MED ORDER — CETYLPYRIDINIUM CHLORIDE 0.05 % MT LIQD
7.0000 mL | Freq: Two times a day (BID) | OROMUCOSAL | Status: DC
  Administered 2014-11-08 – 2014-11-10 (×4): 7 mL via OROMUCOSAL

## 2014-11-08 MED ORDER — ONDANSETRON HCL 4 MG/2ML IJ SOLN
INTRAMUSCULAR | Status: DC | PRN
  Administered 2014-11-08: 4 mg via INTRAVENOUS

## 2014-11-08 MED ORDER — EPHEDRINE SULFATE 50 MG/ML IJ SOLN
INTRAMUSCULAR | Status: DC | PRN
  Administered 2014-11-08: 10 mg via INTRAVENOUS
  Administered 2014-11-08: 5 mg via INTRAVENOUS

## 2014-11-08 MED ORDER — LEVALBUTEROL HCL 1.25 MG/0.5ML IN NEBU
INHALATION_SOLUTION | RESPIRATORY_TRACT | Status: AC
  Filled 2014-11-08: qty 0.5

## 2014-11-08 MED ORDER — LEVALBUTEROL HCL 1.25 MG/0.5ML IN NEBU
1.2500 mg | INHALATION_SOLUTION | Freq: Once | RESPIRATORY_TRACT | Status: AC
  Administered 2014-11-08: 1.25 mg via RESPIRATORY_TRACT

## 2014-11-08 MED ORDER — SUCCINYLCHOLINE CHLORIDE 20 MG/ML IJ SOLN
INTRAMUSCULAR | Status: DC | PRN
  Administered 2014-11-08: 100 mg via INTRAVENOUS

## 2014-11-08 MED ORDER — LACTATED RINGERS IV SOLN
INTRAVENOUS | Status: DC | PRN
  Administered 2014-11-08 (×2): via INTRAVENOUS

## 2014-11-08 MED ORDER — NEOSTIGMINE METHYLSULFATE 10 MG/10ML IV SOLN
INTRAVENOUS | Status: DC | PRN
  Administered 2014-11-08: 4 mg via INTRAVENOUS

## 2014-11-08 MED ORDER — SODIUM CHLORIDE 3 % IN NEBU
INHALATION_SOLUTION | RESPIRATORY_TRACT | Status: AC
  Filled 2014-11-08: qty 15

## 2014-11-08 MED ORDER — HYDROMORPHONE HCL 1 MG/ML IJ SOLN
INTRAMUSCULAR | Status: DC | PRN
  Administered 2014-11-08: .5 mg via INTRAVENOUS

## 2014-11-08 MED ORDER — 0.9 % SODIUM CHLORIDE (POUR BTL) OPTIME
TOPICAL | Status: DC | PRN
  Administered 2014-11-08: 1000 mL

## 2014-11-08 MED ORDER — FENTANYL CITRATE (PF) 100 MCG/2ML IJ SOLN
INTRAMUSCULAR | Status: DC | PRN
  Administered 2014-11-08: 50 ug via INTRAVENOUS
  Administered 2014-11-08: 100 ug via INTRAVENOUS
  Administered 2014-11-08: 25 ug via INTRAVENOUS
  Administered 2014-11-08: 50 ug via INTRAVENOUS
  Administered 2014-11-08: 25 ug via INTRAVENOUS

## 2014-11-08 MED ORDER — METOPROLOL TARTRATE 25 MG PO TABS
25.0000 mg | ORAL_TABLET | Freq: Every day | ORAL | Status: DC
  Administered 2014-11-09: 25 mg via ORAL
  Filled 2014-11-08: qty 1

## 2014-11-08 MED ORDER — LACTATED RINGERS IR SOLN
Status: DC | PRN
  Administered 2014-11-08: 4000 mL

## 2014-11-08 MED ORDER — CIPROFLOXACIN IN D5W 400 MG/200ML IV SOLN
INTRAVENOUS | Status: DC | PRN
  Administered 2014-11-08: 400 mg via INTRAVENOUS

## 2014-11-08 MED ORDER — HYDROMORPHONE HCL 1 MG/ML IJ SOLN: 0.2500 mg | INTRAMUSCULAR | Status: DC | PRN

## 2014-11-08 MED ORDER — PROPOFOL 10 MG/ML IV BOLUS
INTRAVENOUS | Status: DC | PRN
  Administered 2014-11-08: 50 mg via INTRAVENOUS
  Administered 2014-11-08: 120 mg via INTRAVENOUS

## 2014-11-08 MED ORDER — GLYCOPYRROLATE 0.2 MG/ML IJ SOLN
INTRAMUSCULAR | Status: DC | PRN
  Administered 2014-11-08: .6 mg via INTRAVENOUS

## 2014-11-08 MED ORDER — PHENYLEPHRINE HCL 10 MG/ML IJ SOLN
INTRAMUSCULAR | Status: DC | PRN
  Administered 2014-11-08: 40 ug via INTRAVENOUS

## 2014-11-08 MED ORDER — BUPIVACAINE HCL (PF) 0.25 % IJ SOLN
INTRAMUSCULAR | Status: DC | PRN
  Administered 2014-11-08: 30 mL

## 2014-11-08 SURGICAL SUPPLY — 37 items
APL SKNCLS STERI-STRIP NONHPOA (GAUZE/BANDAGES/DRESSINGS)
APPLIER CLIP ROT 10 11.4 M/L (STAPLE) ×3
APR CLP MED LRG 11.4X10 (STAPLE) ×1
BAG SPEC RTRVL LRG 6X4 10 (ENDOMECHANICALS)
BENZOIN TINCTURE PRP APPL 2/3 (GAUZE/BANDAGES/DRESSINGS) ×1 IMPLANT
CHLORAPREP W/TINT 26ML (MISCELLANEOUS) ×3 IMPLANT
CHOLANGIOGRAM CATH TAUT (CATHETERS) ×3 IMPLANT
CLIP APPLIE ROT 10 11.4 M/L (STAPLE) ×1 IMPLANT
CLOSURE WOUND 1/4X4 (GAUZE/BANDAGES/DRESSINGS)
COVER MAYO STAND STRL (DRAPES) ×2 IMPLANT
DECANTER SPIKE VIAL GLASS SM (MISCELLANEOUS) ×3 IMPLANT
DRAPE C-ARM 42X120 X-RAY (DRAPES) ×2 IMPLANT
DRAPE LAPAROSCOPIC ABDOMINAL (DRAPES) ×3 IMPLANT
ELECT REM PT RETURN 9FT ADLT (ELECTROSURGICAL) ×3
ELECTRODE REM PT RTRN 9FT ADLT (ELECTROSURGICAL) ×1 IMPLANT
EVACUATOR SILICONE 100CC (DRAIN) ×2 IMPLANT
GLOVE SURG SIGNA 7.5 PF LTX (GLOVE) ×3 IMPLANT
GOWN STRL REUS W/TWL XL LVL3 (GOWN DISPOSABLE) ×9 IMPLANT
HEMOSTAT SURGICEL 4X8 (HEMOSTASIS) IMPLANT
IV CATH 14GX2 1/4 (CATHETERS) ×3 IMPLANT
IV SET EXTENSION CATH 6 NF (IV SETS) ×3 IMPLANT
KIT BASIN OR (CUSTOM PROCEDURE TRAY) ×3 IMPLANT
LIQUID BAND (GAUZE/BANDAGES/DRESSINGS) ×2 IMPLANT
POUCH SPECIMEN RETRIEVAL 10MM (ENDOMECHANICALS) IMPLANT
SET IRRIG TUBING LAPAROSCOPIC (IRRIGATION / IRRIGATOR) ×3 IMPLANT
SLEEVE XCEL OPT CAN 5 100 (ENDOMECHANICALS) ×3 IMPLANT
SPONGE DRAIN TRACH 4X4 STRL 2S (GAUZE/BANDAGES/DRESSINGS) ×2 IMPLANT
STOPCOCK 4 WAY LG BORE MALE ST (IV SETS) ×3 IMPLANT
STRIP CLOSURE SKIN 1/4X4 (GAUZE/BANDAGES/DRESSINGS) ×1 IMPLANT
SUT ETHILON 2 0 PS N (SUTURE) ×3 IMPLANT
SUT VIC AB 5-0 PS2 18 (SUTURE) ×3 IMPLANT
SUT VICRYL 0 UR6 27IN ABS (SUTURE) ×3 IMPLANT
TOWEL OR 17X26 10 PK STRL BLUE (TOWEL DISPOSABLE) ×3 IMPLANT
TRAY LAPAROSCOPIC (CUSTOM PROCEDURE TRAY) ×3 IMPLANT
TROCAR BLADELESS OPT 5 100 (ENDOMECHANICALS) ×3 IMPLANT
TROCAR XCEL BLUNT TIP 100MML (ENDOMECHANICALS) ×3 IMPLANT
TROCAR XCEL NON-BLD 11X100MML (ENDOMECHANICALS) IMPLANT

## 2014-11-08 NOTE — Anesthesia Postprocedure Evaluation (Signed)
  Anesthesia Post-op Note  Patient: Travis Ferguson  Procedure(s) Performed: Procedure(s) (LRB): LAPAROSCOPIC CHOLECYSTECTOMY  (N/A)  Patient Location: PACU  Anesthesia Type: General  Level of Consciousness: awake and alert   Airway and Oxygen Therapy: Patient Spontanous Breathing  Post-op Pain: mild  Post-op Assessment: Post-op Vital signs reviewed, Patient's Cardiovascular Status Stable, Respiratory Function Stable, Patent Airway and No signs of Nausea or vomiting  Last Vitals:  Filed Vitals:   11/08/14 1500  BP: 120/64  Pulse: 100  Temp:   Resp: 24    Post-op Vital Signs: stable   Complications: No apparent anesthesia complications. Kept intubated until in PACU for about 20 minutes to make sure her was strong and awake. Extubated in PACU and he complained of that time of SOB. Xopenex neb given as HR elevated. He showed improvement and was transferred to stepdown unit in satisfactory condition.

## 2014-11-08 NOTE — Care Management Note (Signed)
Case Management Note  Patient Details  Name: Travis Ferguson MRN: [id number] Date of Birth: 14-Jun-1941  Subjective/Objective:             Cholecystectomy hypotensive and reps failure requiring 40% o2 post op and iv fld challenge.       Action/Plan:   Home when stable   Expected Discharge Date:  [id number]               Expected Discharge Plan:  Home/Self Care  In-House Referral:  NA  Discharge planning Services  CM Consult  Post Acute Care Choice:  NA Choice offered to:  NA  DME Arranged:    DME Agency:     HH Arranged:    HH Agency:     Status of Service:  In process, will continue to follow  Medicare Important Message Given:    Date Medicare IM Given:    Medicare IM give by:    Date Additional Medicare IM Given:    Additional Medicare Important Message give by:     If discussed at [place] of Stay Meetings, dates discussed:    Additional Comments:  Leeroy Cha, RN 11/08/2014, 2:51 PM

## 2014-11-08 NOTE — Anesthesia Procedure Notes (Signed)
Procedure Name: Intubation Performed by: Chyrel Masson Pre-anesthesia Checklist: Patient identified Patient Re-evaluated:Patient Re-evaluated prior to inductionIntubation Type: IV induction Ventilation: Mask ventilation without difficulty Laryngoscope Size: Mac and 4 Grade View: Grade I Tube type: Oral Tube size: 7.5 mm Number of attempts: 1 Airway Equipment and Method: Stylet Placement Confirmation: ETT inserted through vocal cords under direct vision,  positive ETCO2 and breath sounds checked- equal and bilateral Secured at: 22 cm Tube secured with: Tape Dental Injury: Teeth and Oropharynx as per pre-operative assessment

## 2014-11-08 NOTE — Op Note (Signed)
11/07/2014 - 11/08/2014  11:18 AM  PATIENT:  Travis Ferguson, 74 y.o., male, MRN: [id number]  PREOP DIAGNOSIS:  gall bladder disease  POSTOP DIAGNOSIS:   Severe chronic cholecystitis, cholelitiasis  PROCEDURE:   Procedure(s): LAPAROSCOPIC CHOLECYSTECTOMY (5 port), no cholangiogram  SURGEON:   Alphonsa Overall, M.D.  ASSISTANT:   B. Hoxworth, M.D.  ANESTHESIA:   general  Anesthesiologist: Franne Grip, MD CRNA: Chyrel Masson, CRNA; Lollie Sails, CRNA  General  ASA: 3  EBL:  minimal  ml  BLOOD ADMINISTERED: none  DRAINS: 19 F Blake drain  LOCAL MEDICATIONS USED:   30 1/4% marcaine  SPECIMEN:   Gall bladder  COUNTS CORRECT:  YES  INDICATIONS FOR PROCEDURE:  [name] is a 74 y.o. (DOB: 10/13/1940) white  male whose primary care physician is Jerlyn Ly, MD and comes for cholecystectomy.   He underwent an ERCP yesterday by Dr. Rikki Spearing.  He is followed by Dr. Halford Chessman for pulmonary problems.  He was hospitalized form 08/16/2014 to 08/25/2014 with acute cholecystitis which was treated with a perc drain.  He now comes for interval cholecystectomy.   The indications and risks of the gall bladder surgery were explained to the patient.  The risks include, but are not limited to, infection, bleeding, common bile duct injury and open surgery.  SURGERY:  The patient was taken to room #11 at [facility].  The abdomen was prepped with chloroprep.  The patient was given Cipro at the beginning of the operation.   A time out was held and the surgical checklist run.   Prior to surgery, I removed the gall bladder perc drain with out difficulty.   An infraumbilical incision was made into the abdominal cavity.  A 12 mm Hasson trocar was inserted into the abdominal cavity through the infraumbilical incision and secured with a 0 Vicryl suture.  Four additional trocars were inserted: a 10 mm trocar in the sub-xiphoid location, a 5 mm trocar in the right mid subcostal area, a 5 mm trocar in  the right lateral subcostal area, and a 5 mm trocar midway between the xiphoid and umbilicus.   The abdomen was explored and the liver, stomach, and bowel that could be seen were unremarkable.   The gall bladder was densely scarred in consistent with his history.  But by being patient, I was able to identify his anatomy fairly well.  I grasped the gall bladder and rotated it cephalad.  Disssection was carried down to the gall bladder/cystic duct junction and the cystic duct isolated.  The gall bladder wall had perforated in the distal 1/3 of the body and laterally.  There were bilirubinate stones that I would be retrieving the whole case.   I did my dissection down from the distal 1/3 of the gall bladder.  I developed a wide critical view and though there was dense scar tissue at the distal gall bladder and cystic duct, I dissected out the cystic duct.   A clip was placed on the gall bladder side of the cystic duct.    I attempted to shoot an intra-operative cholangiogram was shot.  But fluid would not travel through the cystic duct.  I don't know this was just his anatomy or he had a distal impacted stone, but I thought I had a good dissection which identified his anatomy.  So I was unable to shot a cholangiogram.   The cystic duct was doubly endolooped.  The cystic artery was identified and clipped.  The gall bladder was bluntly and sharpley dissected from the gall bladder bed.   After the gall bladder was removed from the liver, the gall bladder bed and [place] were inspected.  There was no bleeding or bile leak.  The gall bladder was placed in a endocatch bag and delivered through the umbilicus.  The abdomen was irrigated with 3,000 cc saline.   Because of the chronic infection and significant chronic inflammation around the gall bladder, I left a 17 French drain in the gall bladder bed.   The trocars were then removed.  I infiltrated 30cc of 1/4% Marcaine into the incisions.  The  umbilical port closed with a 0 Vicryl suture and the skin closed with 5-0 Monocryl.  The skin was painted with Dermabond.  The patient's sponge and needle count were correct.  The patient was transported to the RR in good condition.   Because of his underlying health issues, I will put him in the step down ICU.  Alphonsa Overall, MD, [facility] Pager: [phone] Office phone:  [phone]

## 2014-11-08 NOTE — Transfer of Care (Signed)
Immediate Anesthesia Transfer of Care Note  Patient: Travis Ferguson  Procedure(s) Performed: Procedure(s): LAPAROSCOPIC CHOLECYSTECTOMY  (N/A)  Patient Location: PACU  Anesthesia Type:General  Level of Consciousness: awake and patient cooperative  Airway & Oxygen Therapy: Patient Spontanous Breathing and Patient connected to T-piece oxygen  Post-op Assessment: Report given to RN and Post -op Vital signs reviewed and stable  Post vital signs: Reviewed and stable  Last Vitals:  Filed Vitals:   11/08/14 1018  BP:   Pulse: 136  Temp:   Resp: 22    Complications: No apparent anesthesia complications

## 2014-11-08 NOTE — Progress Notes (Signed)
Pt refuses CPAP QHS.  RT to monitor and assess as needed.

## 2014-11-08 NOTE — Progress Notes (Addendum)
Patient ID: Travis Ferguson, male   DOB: [birth year], 74 y.o.   MRN: [id number] [facility] PROGRESS NOTE  Travis Ferguson ESP:[id number] DOB: January 24, 1941 DOA: 11/07/2014 PCP: Travis Ly, MD  Brief narrative:    74 y.o. male with past medical history of hypertension, OSA on CPAP, COPD on home oxygen, history of recent gangrenous cholecystitis s/p cholecystostomy tube (07/2014). He underwent ERCP  on the day of this admission, 11/07/2014. Pt will undergo cholecystectomy 11/08/2014 by Dr. Lucia Ferguson.   Assessment/Plan:    Active Problems:   Bile duct stone / Recent history of gangrenous cholecystitis status post cholecystostomy tube - Underwent ERCP with common bile duct stone extraction 11/07/2014 - Plan for cholecystectomy 11/08/14 by Dr. Alphonsa Ferguson  - Continue to monitor in SDU post-operatively - Continue supportive care with IV fluids, analgesia and antiemetics as needed  COPD / Chronic respiratory failure on home oxygen / OSA on CPAP - Stable  - Continue symbicort inhaler BID  Essential hypertension - Continue metoprolol 25 mg daily  Depression - Continue Prozac daily  DVT Prophylaxis  - Lovenox subQ   Code Status: Full.  Family Communication:  Family not at the bedside this am  Disposition Plan: plan for surgery today.   IV access:  Peripheral IV  Procedures and diagnostic studies:    Dg Ercp Biliary & Pancreatic Ducts 11/07/2014  1. Cholelithiasis and choledocholithiasis, without evidence of biliary ductal dilatation. These images were submitted for radiologic interpretation only. Please see the procedural report for the amount of contrast and the fluoroscopy time utilized.   Electronically Signed   By: Travis Ferguson M.D.   On: 11/07/2014 15:09   Medical Consultants:  Surgery, Dr. Alphonsa Ferguson  Other Consultants:  None   IAnti-Infectives:   None    Travis Lenz, MD  [facility] Pager [phone]  Time spent in minutes: 25 minutes  If 7PM-7AM, please contact  night-coverage [email or web address] Password [email or web address] 11/08/2014, 4:21 PM   LOS: 0 days    HPI/Subjective: No acute overnight events. No vomiting. No shortness of breath.   Objective: Filed Vitals:   11/08/14 1300 11/08/14 1400 11/08/14 1500 11/08/14 1600  BP: 97/59 114/44 120/64 119/69  Pulse: 97 99 100 99  Temp:      TempSrc:      Resp: '21 22 24 20  '$ Height:      Weight:      SpO2: 95% 95% 96% 95%    Intake/Output Summary (Last 24 hours) at 11/08/14 1621 Last data filed at 11/08/14 1510  Gross per 24 hour  Intake   2485 ml  Output    420 ml  Net   2065 ml    Exam:   General:  Pt is alert, follows commands appropriately, not in acute distress  Cardiovascular: Regular rate and rhythm, S1/S2, no murmurs  Respiratory: Clear to auscultation bilaterally, no wheezing, no crackles, no rhonchi  Abdomen: Now cholecystostomy tube (+), non distended, bowel sounds present  Extremities: No edema, pulses DP and PT palpable bilaterally  Neuro: Grossly nonfocal  Data Reviewed: Basic Metabolic Panel:  Recent Labs Lab 11/03/14 1420  NA 135  K 3.8  CL 110  CO2 21*  GLUCOSE 102*  BUN 14  CREATININE 0.86  CALCIUM 9.2   Liver Function Tests: No results for input(s): AST, ALT, ALKPHOS, BILITOT, PROT, ALBUMIN in the last 168 hours. No results for input(s): LIPASE, AMYLASE in the last 168 hours. No results for input(s): AMMONIA in the last 168 hours. CBC:  Recent Labs Lab 11/03/14 1420  WBC 9.1  HGB 13.6  HCT 41.2  MCV 82.7  PLT 285   Cardiac Enzymes: No results for input(s): CKTOTAL, CKMB, CKMBINDEX, TROPONINI in the last 168 hours. BNP: Invalid input(s): POCBNP CBG: No results for input(s): GLUCAP in the last 168 hours.  Recent Results (from the past 240 hour(s))  MRSA PCR Screening     Status: None   Collection Time: 11/07/14  3:38 PM  Result Value Ref Range Status   MRSA by PCR NEGATIVE NEGATIVE Final     Scheduled Meds: . budesonide-formoterol  2 puff  Inhalation BID  . chlorhexidine  15 mL Mouth Rinse BID  . enoxaparin (LOVENOX) injection  40 mg Subcutaneous Q24H  . finasteride  5 mg Oral QPM  . FLUoxetine  40 mg Oral Q breakfast  . levalbuterol      . [START ON 11/09/2014] metoprolol tartrate  25 mg Oral Daily  . sodium chloride HYPERTONIC       Continuous Infusions: . sodium chloride 75 mL/hr at 11/08/14 1507

## 2014-11-09 ENCOUNTER — Encounter (HOSPITAL_COMMUNITY): Payer: Self-pay | Admitting: Surgery

## 2014-11-09 DIAGNOSIS — K810 Acute cholecystitis: Secondary | ICD-10-CM

## 2014-11-09 LAB — COMPREHENSIVE METABOLIC PANEL
ALBUMIN: 2.9 g/dL — AB (ref 3.5–5.0)
ALT: 28 U/L (ref 17–63)
AST: 31 U/L (ref 15–41)
Alkaline Phosphatase: 55 U/L (ref 38–126)
Anion gap: 9 (ref 5–15)
BUN: 15 mg/dL (ref 6–20)
CHLORIDE: 106 mmol/L (ref 101–111)
CO2: 23 mmol/L (ref 22–32)
CREATININE: 0.88 mg/dL (ref 0.61–1.24)
Calcium: 8.6 mg/dL — ABNORMAL LOW (ref 8.9–10.3)
GFR calc Af Amer: >60 mL/min (ref >60)
GFR calc non Af Amer: >60 mL/min (ref >60)
Glucose, Bld: 125 mg/dL — ABNORMAL HIGH (ref 65–99)
Potassium: 4.2 mmol/L (ref 3.5–5.1)
SODIUM: 138 mmol/L (ref 135–145)
Total Bilirubin: 2.1 mg/dL — ABNORMAL HIGH (ref 0.3–1.2)
Total Protein: 5.4 g/dL — ABNORMAL LOW (ref 6.5–8.1)

## 2014-11-09 LAB — CBC WITH DIFFERENTIAL/PLATELET
Basophils Absolute: 0 10*3/uL (ref 0.0–0.1)
Basophils Relative: 0 % (ref 0–1)
EOS PCT: 0 % (ref 0–5)
Eosinophils Absolute: 0 10*3/uL (ref 0.0–0.7)
HCT: 37 % — ABNORMAL LOW (ref 39.0–52.0)
Hemoglobin: 11.8 g/dL — ABNORMAL LOW (ref 13.0–17.0)
LYMPHS ABS: 0.7 10*3/uL (ref 0.7–4.0)
Lymphocytes Relative: 5 % — ABNORMAL LOW (ref 12–46)
MCH: 26.9 pg (ref 26.0–34.0)
MCHC: 31.9 g/dL (ref 30.0–36.0)
MCV: 84.5 fL (ref 78.0–100.0)
MONO ABS: 0.8 10*3/uL (ref 0.1–1.0)
MONOS PCT: 6 % (ref 3–12)
Neutro Abs: 11.8 10*3/uL — ABNORMAL HIGH (ref 1.7–7.7)
Neutrophils Relative %: 89 % — ABNORMAL HIGH (ref 43–77)
PLATELETS: 183 10*3/uL (ref 150–400)
RBC: 4.38 MIL/uL (ref 4.22–5.81)
RDW: 14.6 % (ref 11.5–15.5)
WBC: 13.2 10*3/uL — ABNORMAL HIGH (ref 4.0–10.5)

## 2014-11-09 LAB — GLUCOSE, CAPILLARY: Glucose-Capillary: 103 mg/dL — ABNORMAL HIGH (ref 65–99)

## 2014-11-09 MED ORDER — METOPROLOL TARTRATE 50 MG PO TABS
50.0000 mg | ORAL_TABLET | Freq: Two times a day (BID) | ORAL | Status: DC
  Administered 2014-11-09 – 2014-11-10 (×2): 50 mg via ORAL
  Filled 2014-11-09 (×3): qty 1

## 2014-11-09 MED ORDER — TRAZODONE HCL 100 MG PO TABS
100.0000 mg | ORAL_TABLET | Freq: Every day | ORAL | Status: DC
  Administered 2014-11-09: 100 mg via ORAL
  Filled 2014-11-09 (×2): qty 1

## 2014-11-09 MED ORDER — HYDROCODONE-ACETAMINOPHEN 5-325 MG PO TABS
1.0000 | ORAL_TABLET | ORAL | Status: DC | PRN
  Administered 2014-11-09 – 2014-11-10 (×2): 2 via ORAL
  Filled 2014-11-09 (×2): qty 2

## 2014-11-09 MED ORDER — OXYCODONE HCL 5 MG PO TABS
10.0000 mg | ORAL_TABLET | Freq: Every day | ORAL | Status: DC
  Administered 2014-11-09: 10 mg via ORAL
  Filled 2014-11-09: qty 2

## 2014-11-09 MED ORDER — SIMVASTATIN 20 MG PO TABS
20.0000 mg | ORAL_TABLET | Freq: Every evening | ORAL | Status: DC
  Filled 2014-11-09 (×2): qty 1

## 2014-11-09 NOTE — Progress Notes (Signed)
General Surgery Note  LOS: 1 day  POD -  1 Day Post-Op  Assessment/Plan: 1.  LAPAROSCOPIC CHOLECYSTECTOMY - 11/08/2014 - D. Jaziel Bennett  Doing well though sore  To advance diet  Okay to move out of unit from my standpoint.  2. CHOLEDOCHOLITHIASIS (574.50  K80.50) Successful ERCP with sphincterotomy by Dr. Rikki Spearing - 11/07/2014 3. 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 Halford Chessman.  4. Depression - His chronic back and leg pain are getting to him He has talked about PT 5. Chronic back and leg pain This has been a significant problem for him He is followed by Dr. Henrine Screws 6. Chronic hydrocodone use He takes 2 tabs per day for his back and leg pain.  I will put him back on his oxycodone for his back and give him hydrocodone for post op pain. 7.  DVT prophylaxis - Lovenox   Active Problems:   Bile duct stone   Cholecystitis   Subjective:  Sore, but doing okay.  Taking liquids. Objective:   Filed Vitals:   11/09/14 0800  BP:   Pulse:   Temp: 98.6 F (37 C)  Resp:      Intake/Output from previous day:  05/17 0701 - 05/18 0700 In: 3296.3 [P.O.:900; I.V.:2391.3] Out: 5465 [Urine:1450; Drains:105]  Intake/Output this shift:      Physical Exam:   General: WN older WM who is alert and oriented.    HEENT: Normal. Pupils equal. .   Lungs: Some wheezes.  He pulls about 1,000 cc on IS.   Abdomen: Mild distention.  BS present.   Wound: Clean.  Drain RUQ - 105 cc recorded last 24 hours   Lab Results:    Recent Labs  11/09/14 0345  WBC 13.2*  HGB 11.8*  HCT 37.0*  PLT 183    BMET   Recent Labs  11/09/14 0345  NA 138  K 4.2  CL 106  CO2 23  GLUCOSE 125*  BUN 15  CREATININE 0.88  CALCIUM 8.6*    PT/INR  No results for input(s): LABPROT, INR in the last 72 hours.  ABG  No results for input(s): PHART, HCO3 in the last 72 hours.  Invalid input(s): PCO2,  PO2   Studies/Results:  Dg Ercp Biliary & Pancreatic Ducts  11/07/2014   CLINICAL DATA:  Cholelithiasis  EXAM: ERCP  TECHNIQUE: Multiple spot images obtained with the fluoroscopic device and submitted for interpretation post-procedure.  FLUOROSCOPY TIME:  5 minutes 33 seconds  COMPARISON:  Cholecystostomy catheter injection 09/27/2014 and earlier studies  FINDINGS: A series of images documents endoscopic catheterization of the CBD. There is opacification of the lumen the gallbladder with multiple filling defects consistent with stones. Percutaneous pigtail drain catheter stable in position. Later images document opacification of the CBD. CBD is nondilated. Partially occlusive filling defects in the distal CBD. Incomplete opacification of intrahepatic bile ducts, which appear decompressed centrally.  IMPRESSION: 1. Cholelithiasis and choledocholithiasis, without evidence of biliary ductal dilatation. These images were submitted for radiologic interpretation only. Please see the procedural report for the amount of contrast and the fluoroscopy time utilized.   Electronically Signed   By: Lucrezia Europe M.D.   On: 11/07/2014 15:09     Anti-infectives:   Anti-infectives    Start     Dose/Rate Route Frequency Ordered Stop   11/07/14 1230  ciprofloxacin (CIPRO) IVPB 400 mg     400 mg 200 mL/hr over 60 Minutes Intravenous  Once 11/07/14 1221 11/07/14 1325  Alphonsa Overall, MD, FACS Pager: [phone] Surgery Office: [phone] 11/09/2014

## 2014-11-09 NOTE — Progress Notes (Signed)
Patient refused CPAP at this time.  States he does not used CPAP at home.  Currently on 2 lpm [place].

## 2014-11-09 NOTE — Progress Notes (Signed)
Patient Demographics  Travis Ferguson, is a 74 y.o. male, DOB - 1941-06-11, OHY:[id number]  Admit date - 11/07/2014   Admitting Physician Robbie Lis, MD  Outpatient Primary MD for the patient is Jerlyn Ly, MD  LOS - 1   No chief complaint on file.       Subjective:   Travis Ferguson today has, No headache, No chest pain, No abdominal pain - No Nausea, No new weakness tingling or numbness, No Cough - SOB.    Assessment & Plan    1. Gangrenous cholecystitis. Status post laparoscopic cholecystectomy by Dr. Lucia Gaskins on 11/07/2014, tolerated the procedure well, now placed on diet. General surgery monitoring and managing the primary problem.   2. 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 Continue supportive care with verbalizes treatments and oxygen as needed, he does use oxygen schedule 2 L at night.   3. Essential hypertension. On Lopressor dose increased for better control. Will monitor.   4. Depression. On Prozac.   5. Dyslipidemia. On home dose statin     Code Status: Full  Family Communication: None  Disposition Plan: Home   Consults   CCS   Procedures  laparoscopic cholecystectomy on 11/07/2014,   DVT Prophylaxis  Lovenox   Lab Results  Component Value Date   PLT 183 11/09/2014    Medications  Scheduled Meds: . antiseptic oral rinse  7 mL Mouth Rinse BID  . budesonide-formoterol  2 puff Inhalation BID  . enoxaparin (LOVENOX) injection  40 mg Subcutaneous Q24H  . finasteride  5 mg Oral QPM  . FLUoxetine  40 mg Oral Q breakfast  . metoprolol tartrate  50 mg Oral BID  . oxyCODONE  10 mg Oral Daily  . simvastatin  20 mg Oral QPM  . sodium chloride  3 mL Intravenous Q12H  . traZODone  100 mg Oral QHS    Continuous Infusions: . sodium chloride 10 mL/hr at 11/09/14 0849   PRN Meds:.acetaminophen **OR** acetaminophen, albuterol, HYDROcodone-acetaminophen, morphine injection, ondansetron **OR** ondansetron (ZOFRAN) IV  Antibiotics     Anti-infectives    Start     Dose/Rate Route Frequency Ordered Stop   11/07/14 1230  ciprofloxacin (CIPRO) IVPB 400 mg     400 mg 200 mL/hr over 60 Minutes Intravenous  Once 11/07/14 1221 11/07/14 1325        Objective:   Filed Vitals:   11/09/14 0700 11/09/14 0800 11/09/14 0900 11/09/14 0948  BP: 144/72 134/88 106/74 106/74  Pulse: 109 115 109 109  Temp:  98.6 F (37 C)    TempSrc:  Oral    Resp: '20 14 19   '$ Height:      Weight:      SpO2: 95% 96% 97%     Wt Readings from Last 3 Encounters:  11/07/14 112 kg (246 lb 14.6 oz)  10/24/14 112.265 kg (247 lb 8 oz)  09/14/14 113.036 kg (249 lb 3.2 oz)     Intake/Output Summary (Last 24 hours) at 11/09/14 1156 Last data filed at 11/09/14 1147  Gross per 24 hour  Intake 1691.25 ml  Output   1825 ml  Net -133.75 ml     Physical Exam  Awake Alert, Oriented  X 3, No new F.N deficits, Normal affect [place].AT,PERRAL Supple Neck,No JVD, No cervical lymphadenopathy appriciated.  Symmetrical Chest wall movement, Good air movement bilaterally, CTAB RRR,No Gallops,Rubs or new Murmurs, No Parasternal Heave +ve B.Sounds, Abd Soft, No tenderness, No organomegaly appriciated, No rebound - guarding or rigidity. Right upper quadrant JP drain in place No Cyanosis, Clubbing or edema, No new Rash or bruise      Data Review   Micro Results Recent Results (from the past 240 hour(s))  MRSA PCR Screening     Status: None   Collection Time: 11/07/14  3:38 PM  Result Value Ref Range Status   MRSA by PCR NEGATIVE NEGATIVE Final    Comment:        The GeneXpert MRSA Assay (FDA approved for NASAL specimens only), is one component of a comprehensive MRSA colonization surveillance program. It is  not intended to diagnose MRSA infection nor to guide or monitor treatment for MRSA infections.     Radiology Reports Dg Chest 2 View  11/03/2014   CLINICAL DATA:  Cholecystitis.  Pre-op respiratory exam  EXAM: CHEST  2 VIEW  COMPARISON:  08/19/2014 and 04/07/2014  FINDINGS: Heart size remains within normal limits. Chronic prominence of pulmonary interstitial markings appears stable. No evidence of acute pulmonary infiltrate or pleural effusion. Percutaneous cholecystostomy tube noted in the right upper quadrant.  IMPRESSION: Stable exam.  No active cardiopulmonary disease.   Electronically Signed   By: Earle Gell M.D.   On: 11/03/2014 16:35   Dg Ercp Biliary & Pancreatic Ducts  11/07/2014   CLINICAL DATA:  Cholelithiasis  EXAM: ERCP  TECHNIQUE: Multiple spot images obtained with the fluoroscopic device and submitted for interpretation post-procedure.  FLUOROSCOPY TIME:  5 minutes 33 seconds  COMPARISON:  Cholecystostomy catheter injection 09/27/2014 and earlier studies  FINDINGS: A series of images documents endoscopic catheterization of the CBD. There is opacification of the lumen the gallbladder with multiple filling defects consistent with stones. Percutaneous pigtail drain catheter stable in position. Later images document opacification of the CBD. CBD is nondilated. Partially occlusive filling defects in the distal CBD. Incomplete opacification of intrahepatic bile ducts, which appear decompressed centrally.  IMPRESSION: 1. Cholelithiasis and choledocholithiasis, without evidence of biliary ductal dilatation. These images were submitted for radiologic interpretation only. Please see the procedural report for the amount of contrast and the fluoroscopy time utilized.   Electronically Signed   By: Lucrezia Europe M.D.   On: 11/07/2014 15:09     CBC  Recent Labs Lab 11/03/14 1420 11/09/14 0345  WBC 9.1 13.2*  HGB 13.6 11.8*  HCT 41.2 37.0*  PLT 285 183  MCV 82.7 84.5  MCH 27.3 26.9  MCHC  33.0 31.9  RDW 14.5 14.6  LYMPHSABS  --  0.7  MONOABS  --  0.8  EOSABS  --  0.0  BASOSABS  --  0.0    Chemistries   Recent Labs Lab 11/03/14 1420 11/09/14 0345  NA 135 138  K 3.8 4.2  CL 110 106  CO2 21* 23  GLUCOSE 102* 125*  BUN 14 15  CREATININE 0.86 0.88  CALCIUM 9.2 8.6*  AST  --  31  ALT  --  28  ALKPHOS  --  55  BILITOT  --  2.1*   ------------------------------------------------------------------------------------------------------------------ estimated creatinine clearance is 95.2 mL/min (by C-G formula based on Cr of 0.88). ------------------------------------------------------------------------------------------------------------------ No results for input(s): HGBA1C in the last 72 hours. ------------------------------------------------------------------------------------------------------------------ No results for input(s): CHOL, HDL, LDLCALC, TRIG, CHOLHDL,  LDLDIRECT in the last 72 hours. ------------------------------------------------------------------------------------------------------------------ No results for input(s): TSH, T4TOTAL, T3FREE, THYROIDAB in the last 72 hours.  Invalid input(s): FREET3 ------------------------------------------------------------------------------------------------------------------ No results for input(s): VITAMINB12, FOLATE, FERRITIN, TIBC, IRON, RETICCTPCT in the last 72 hours.  Coagulation profile No results for input(s): INR, PROTIME in the last 168 hours.  No results for input(s): DDIMER in the last 72 hours.  Cardiac Enzymes No results for input(s): CKMB, TROPONINI, MYOGLOBIN in the last 168 hours.  Invalid input(s): CK ------------------------------------------------------------------------------------------------------------------ Invalid input(s): POCBNP   Time Spent in minutes   35   SINGH,PRASHANT K M.D on 11/09/2014 at 11:56 AM  Between 7am to 7pm - Pager - [phone]  After 7pm go to  [email or web address] - password [facility]  [facility]   Office  [phone]

## 2014-11-10 LAB — BASIC METABOLIC PANEL
Anion gap: 7 (ref 5–15)
BUN: 15 mg/dL (ref 6–20)
CALCIUM: 8.6 mg/dL — AB (ref 8.9–10.3)
CHLORIDE: 103 mmol/L (ref 101–111)
CO2: 27 mmol/L (ref 22–32)
Creatinine, Ser: 0.82 mg/dL (ref 0.61–1.24)
GFR calc Af Amer: >60 mL/min (ref >60)
GFR calc non Af Amer: >60 mL/min (ref >60)
GLUCOSE: 102 mg/dL — AB (ref 65–99)
Potassium: 4.3 mmol/L (ref 3.5–5.1)
Sodium: 137 mmol/L (ref 135–145)

## 2014-11-10 LAB — CBC
HCT: 34.6 % — ABNORMAL LOW (ref 39.0–52.0)
HEMOGLOBIN: 11.3 g/dL — AB (ref 13.0–17.0)
MCH: 28 pg (ref 26.0–34.0)
MCHC: 32.7 g/dL (ref 30.0–36.0)
MCV: 85.6 fL (ref 78.0–100.0)
Platelets: 214 10*3/uL (ref 150–400)
RBC: 4.04 MIL/uL — AB (ref 4.22–5.81)
RDW: 15.1 % (ref 11.5–15.5)
WBC: 10 10*3/uL (ref 4.0–10.5)

## 2014-11-10 LAB — MAGNESIUM: Magnesium: 1.8 mg/dL (ref 1.7–2.4)

## 2014-11-10 NOTE — Progress Notes (Signed)
General Surgery Note  LOS: 2 days  POD -  2 Days Post-Op  Assessment/Plan: 1.  LAPAROSCOPIC CHOLECYSTECTOMY - 11/08/2014 - D. [employer] good clinically.  Okay to go home.  I removed the drain.  Note:  I gave him Vicodin (5/325) #30, to go home with.  He should see me in the office ni 2 to 3 weeks.  I have reviewed my discharge instructions with him.  2. CHOLEDOCHOLITHIASIS (574.50  K80.50) Successful ERCP with sphincterotomy by Dr. Rikki Spearing - 11/07/2014 3. 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 Halford Chessman.  4. Depression - His chronic back and leg pain are getting to him He has talked about PT 5. Chronic back and leg pain This has been a significant problem for him He is followed by Dr. Henrine Screws 6. Chronic hydrocodone use He takes 2 tabs (10 mg) per day for his back and leg pain.  I will put him back on his oxycodone for his back and give him hydrocodone for post op pain. 7.  DVT prophylaxis - Lovenox   Principal Problem:   Gangrenous cholecystitis Active Problems:   COPD with chronic bronchitis   Pulmonary sarcoidosis   Hyperlipidemia   Acute on chronic respiratory failure   Bile duct stone   Subjective:  Doing better.  A little frustrated with the bed he has been on.  Ready to go home. Objective:   Filed Vitals:   11/10/14 0614  BP: 118/58  Pulse: 68  Temp: 98.1 F (36.7 C)  Resp: 18     Intake/Output from previous day:  05/18 0701 - 05/19 0700 In: 304.1 [P.O.:75; I.V.:229.1] Out: 815 [Urine:775; Drains:40]  Intake/Output this shift:  Total I/O In: -  Out: 25 [Drains:25]   Physical Exam:   General: WN older WM who is alert and oriented.    HEENT: Normal. Pupils equal. .   Lungs: Some wheezes.     Abdomen: Soft.   BS present.   Wound: Clean.  Drain RUQ - 40 cc recorded last 24 hours   Lab Results:     Recent Labs  11/09/14 0345 11/10/14 0525   WBC 13.2* 10.0  HGB 11.8* 11.3*  HCT 37.0* 34.6*  PLT 183 214    BMET    Recent Labs  11/09/14 0345 11/10/14 0525  NA 138 137  K 4.2 4.3  CL 106 103  CO2 23 27  GLUCOSE 125* 102*  BUN 15 15  CREATININE 0.88 0.82  CALCIUM 8.6* 8.6*    PT/INR  No results for input(s): LABPROT, INR in the last 72 hours.  ABG  No results for input(s): PHART, HCO3 in the last 72 hours.  Invalid input(s): PCO2, PO2   Studies/Results:  No results found.   Anti-infectives:   Anti-infectives    Start     Dose/Rate Route Frequency Ordered Stop   11/07/14 1230  ciprofloxacin (CIPRO) IVPB 400 mg     400 mg 200 mL/hr over 60 Minutes Intravenous  Once 11/07/14 1221 11/07/14 1325      Alphonsa Overall, MD, FACS Pager: [place] [facility] Office: [phone] 11/10/2014

## 2014-11-10 NOTE — Discharge Instructions (Signed)
Follow with Primary MD PERINI,MARK A, MD in 7 days  ° °Get CBC, CMP, 2 view Chest X ray checked  by Primary MD next visit.  ° ° °Activity: As tolerated with Full fall precautions use walker/cane & assistance as needed ° ° °Disposition Home ** ° ° °Diet:   Heart Healthy    ° °For Heart failure patients - Check your Weight same time everyday, if you gain over 2 pounds, or you develop in leg swelling, experience more shortness of breath or chest pain, call your Primary MD immediately. Follow Cardiac Low Salt Diet and 1.5 lit/day fluid restriction. ° ° °On your next visit with your primary care physician please Get Medicines reviewed and adjusted. ° ° °Please request your Prim.MD to go over all Hospital Tests and Procedure/Radiological results at the follow up, please get all Hospital records sent to your Prim MD by signing hospital release before you go home. ° ° °If you experience worsening of your admission symptoms, develop shortness of breath, life threatening emergency, suicidal or homicidal thoughts you must seek medical attention immediately by calling 911 or calling your MD immediately  if symptoms less severe. ° °You Must read complete instructions/literature along with all the possible adverse reactions/side effects for all the Medicines you take and that have been prescribed to you. Take any new Medicines after you have completely understood and accpet all the possible adverse reactions/side effects.  ° °Do not drive, operating heavy machinery, perform activities at heights, swimming or participation in water activities or provide baby sitting services if your were admitted for syncope or siezures until you have seen by Primary MD or a Neurologist and advised to do so again. ° °Do not drive when taking Pain medications.  ° ° °Do not take more than prescribed Pain, Sleep and Anxiety Medications ° °Special Instructions: If you have smoked or chewed Tobacco  in the last 2 yrs please stop smoking, stop any  regular Alcohol  and or any Recreational drug use. ° °Wear Seat belts while driving. ° ° °Please note ° °You were cared for by a hospitalist during your hospital stay. If you have any questions about your discharge medications or the care you received while you were in the hospital after you are discharged, you can call the unit and asked to speak with the hospitalist on call if the hospitalist that took care of you is not available. Once you are discharged, your primary care physician will handle any further medical issues. Please note that NO REFILLS for any discharge medications will be authorized once you are discharged, as it is imperative that you return to your primary care physician (or establish a relationship with a primary care physician if you do not have one) for your aftercare needs so that they can reassess your need for medications and monitor your lab values. ° °

## 2014-11-10 NOTE — Progress Notes (Signed)
Discharge instructions given to pt/spouse, verbalized understanding. Left the unit in stable condition.  

## 2014-11-10 NOTE — Discharge Summary (Signed)
Travis Ferguson, is a 74 y.o. male  DOB 07/06/1940  MRN [id number].  Admission date:  11/07/2014  Admitting Physician  Robbie Lis, MD  Discharge Date:  11/10/2014   Primary MD  Jerlyn Ly, MD  Recommendations for primary care physician for things to follow:   Check CBC, CMP in a week.   Admission Diagnosis  gallstones gall bladder disease   Discharge Diagnosis  gallstones gall bladder disease    Principal Problem:   Gangrenous cholecystitis Active Problems:   COPD with chronic bronchitis   Pulmonary sarcoidosis   Hyperlipidemia   Acute on chronic respiratory failure   Bile duct stone      Past Medical History  Diagnosis Date  . 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  . Depression   . PTSD (post-traumatic stress disorder)   . Migraine   . GERD (gastroesophageal reflux disease)   . Dyslipidemia   . Rhinitis   . Cervical disc disease   . Subdural hematoma 11/10    a. 2010 s/p surgery - diagnosed several weeks after a fall.  . Hypertension   . Coronary artery calcification seen on CAT scan   . Sarcoidosis   . Atherosclerosis   . Dyspnea     chronic  . Back pain     chronic back pain/under pain management  . Inguinal hernia     right side  . Colon polyps     adenomatous and hyperplastic  . Sepsis   . Gallstones   . Sensation problem     right side-lateral hip to knee" decreased sensation and tingling feeling" -has informed Dr. Joylene Draft, Dr. Henrene Pastor, Dr. Lucia Gaskins of this.  . Encounter for biliary drainage tube placement     remains with drainage bag to right side for  biliary drainage.  . Sinus congestion     10-27-14 at present some issues-"not bad"  . History of oxygen administration     @ 2 l/m nasally at bedtime.  . Hiatal hernia    sensation problem ("right lateral side hip to knee").  . OSA (obstructive sleep apnea)     does not use CPAP  . Pneumonia     age 37 or 50   . Anxiety   . Frequent urination   . Arthritis   . Chronic bronchitis     Past Surgical History  Procedure Laterality Date  . Craniotomy  11/10    right frontal"hemorrhage evacuation from fall injury"  . Video bronchoscopy  07/28/2012    Procedure: VIDEO BRONCHOSCOPY WITH FLUORO;  Surgeon: Chesley Mires, MD;  Location: [facility];  Service: Cardiopulmonary;  Laterality: Bilateral;  . Hernia repair Bilateral     right and left inguinal  . Knee arthroscopy Right   . Lumbar laminectomy/decompression microdiscectomy Right 12/15/2012    Procedure: LUMBAR LAMINECTOMY/DECOMPRESSION MICRODISCECTOMY 1 LEVEL;  Surgeon: Elaina Hoops, MD;  Location: [facility];  Service: Neurosurgery;  Laterality: Right;  Right Lumbar four-five laminectomy/foraminotomy  .  Gallbladder surgery      drain tube placed  . Ercp N/A 11/07/2014    Procedure: ENDOSCOPIC RETROGRADE CHOLANGIOPANCREATOGRAPHY (ERCP);  Surgeon: Irene Shipper, MD;  Location: [facility];  Service: Endoscopy;  Laterality: N/A;  . Cholecystectomy N/A 11/08/2014    Procedure: LAPAROSCOPIC CHOLECYSTECTOMY ;  Surgeon: Alphonsa Overall, MD;  Location: [facility];  Service: General;  Laterality: N/A;       History of present illness and  Hospital Course:     Kindly see H&P for history of present illness and admission details, please review complete Labs, Consult reports and Test reports for all details in brief  HPI  from the history and physical done on the day of admission  Travis Ferguson is a 74 y.o. male with PMH of Depression, HTN, OSA (does not use CPAP), COPD, Chronic Respiratory Failure on Home O2, h/o recent gangrenous cholecystitis s/p cholecystostomy tube (07/2014) who underwent ERCP ductal clearance of stones pre-cholecystectomy today by Dr. Henrene Pastor. Patient is planned for cholecystectomy on 5/17. hospitalist  is called for admission, evaluation due to chronic COPD -Patient denies acute chest pains, no acute SOB, he reports some DOE, chronic SOB with no recent change. No nausea, vomiting or diarrhea. No dizziness, no leg edema, no exertional chest pains.   Hospital Course   1. Gangrenous cholecystitis. Status post laparoscopic cholecystectomy by Dr. Lucia Gaskins on 11/07/2014, tolerated the procedure well, now tolerating diet and off antibiotics, discharged as per recommendation from general surgery today. Follow with general surgery and PCP outpatient post discharge. Note He also had ERCP with sphincterotomy by Dr. Henrene Pastor.   2. 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 Continue supportive care with verbalizes treatments and oxygen as needed, he does use oxygen schedule 2 L at night.   3. Essential hypertension. Home regimen unchanged.   4. Depression. On Prozac.   5. Dyslipidemia. On home dose statin     Discharge Condition: Stable   Follow UP  Follow-up Information    Follow up with PERINI,MARK A, MD. Schedule an appointment as soon as possible for a visit in 1 week.   Specialty:  Internal Medicine   Contact information:   [place] [address] [phone]       Follow up with [facility], MD. Schedule an appointment as soon as possible for a visit in 1 week.   Specialty:  General Surgery   Contact information:   [place] [place] [address] [phone]         Discharge Instructions  and  Discharge Medications      Discharge Instructions    Diet - low sodium heart healthy    Complete by:  As directed      Discharge instructions    Complete by:  As directed   Follow with Primary MD Crist Infante A, MD in 7 days   Get CBC, CMP, 2 view Chest X ray checked  by Primary MD next visit.    Activity: As tolerated with Full fall precautions use walker/cane & assistance as  needed   Disposition Home     Diet: Heart Healthy    For Heart failure patients - Check your Weight same time everyday, if you gain over 2 pounds, or you develop in leg swelling, experience more shortness of breath or chest pain, call your Primary MD immediately. Follow Cardiac Low Salt Diet and 1.5 lit/day fluid restriction.   On your next  visit with your primary care physician please Get Medicines reviewed and adjusted.   Please request your Prim.MD to go over all Hospital Tests and Procedure/Radiological results at the follow up, please get all Hospital records sent to your Prim MD by signing hospital release before you go home.   If you experience worsening of your admission symptoms, develop shortness of breath, life threatening emergency, suicidal or homicidal thoughts you must seek medical attention immediately by calling 911 or calling your MD immediately  if symptoms less severe.  You Must read complete instructions/literature along with all the possible adverse reactions/side effects for all the Medicines you take and that have been prescribed to you. Take any new Medicines after you have completely understood and accpet all the possible adverse reactions/side effects.   Do not drive, operating heavy machinery, perform activities at heights, swimming or participation in water activities or provide baby sitting services if your were admitted for syncope or siezures until you have seen by Primary MD or a Neurologist and advised to do so again.  Do not drive when taking Pain medications.    Do not take more than prescribed Pain, Sleep and Anxiety Medications  Special Instructions: If you have smoked or chewed Tobacco  in the last 2 yrs please stop smoking, stop any regular Alcohol  and or any Recreational drug use.  Wear Seat belts while driving.   Please note  You were cared for by a hospitalist during your hospital stay. If you have any questions about your discharge  medications or the care you received while you were in the hospital after you are discharged, you can call the unit and asked to speak with the hospitalist on call if the hospitalist that took care of you is not available. Once you are discharged, your primary care physician will handle any further medical issues. Please note that NO REFILLS for any discharge medications will be authorized once you are discharged, as it is imperative that you return to your primary care physician (or establish a relationship with a primary care physician if you do not have one) for your aftercare needs so that they can reassess your need for medications and monitor your lab values.     Increase activity slowly    Complete by:  As directed             Medication List    STOP taking these medications        amoxicillin-clavulanate 875-125 MG per tablet  Commonly known as:  AUGMENTIN      TAKE these medications        budesonide-formoterol 160-4.5 MCG/ACT inhaler  Commonly known as:  SYMBICORT  Inhale 2 puffs into the lungs 2 (two) times daily.     finasteride 5 MG tablet  Commonly known as:  PROSCAR  Take 1 tablet by mouth every evening.     FLUoxetine 40 MG capsule  Commonly known as:  PROZAC  Take 40 mg by mouth daily with breakfast.     HYDROcodone-acetaminophen 5-325 MG per tablet  Commonly known as:  NORCO/VICODIN  Take 2 tablets by mouth 2 (two) times daily as needed (for pain).     losartan-hydrochlorothiazide 50-12.5 MG per tablet  Commonly known as:  HYZAAR  Take 0.5 tablets by mouth every morning.     metoprolol tartrate 25 MG tablet  Commonly known as:  LOPRESSOR  Take 25 mg by mouth daily with breakfast.     multivitamin tablet  Take 1 tablet  by mouth daily.     OXYGEN  Inhale 2 L/min into the lungs at bedtime.     PROAIR HFA 108 (90 BASE) MCG/ACT inhaler  Generic drug:  albuterol  1-2 PUFFS EVERY 4-6 HOURS AS NEEDED     simvastatin 20 MG tablet  Commonly known as:   ZOCOR  Take 1 tablet by mouth every evening.     sodium chloride 0.65 % Soln nasal spray  Commonly known as:  OCEAN  Place 1-2 sprays into both nostrils daily as needed for congestion.     traZODone 100 MG tablet  Commonly known as:  DESYREL  Take 1 tablet (100 mg total) by mouth at bedtime.          Diet and Activity recommendation: See Discharge Instructions above   Consults obtained - GI, general surgery   Major procedures and Radiology Reports - PLEASE review detailed and final reports for all details, in brief -     laparoscopic cholecystectomy on 11/07/2014, by Dr. Lucia Gaskins general surgery.  ERCP with sphincterotomy by Dr. Henrene Pastor  Dg Chest 2 View  11/03/2014   CLINICAL DATA:  Cholecystitis.  Pre-op respiratory exam  EXAM: CHEST  2 VIEW  COMPARISON:  08/19/2014 and 04/07/2014  FINDINGS: Heart size remains within normal limits. Chronic prominence of pulmonary interstitial markings appears stable. No evidence of acute pulmonary infiltrate or pleural effusion. Percutaneous cholecystostomy tube noted in the right upper quadrant.  IMPRESSION: Stable exam.  No active cardiopulmonary disease.   Electronically Signed   By: Earle Gell M.D.   On: 11/03/2014 16:35   Dg Ercp Biliary & Pancreatic Ducts  11/07/2014   CLINICAL DATA:  Cholelithiasis  EXAM: ERCP  TECHNIQUE: Multiple spot images obtained with the fluoroscopic device and submitted for interpretation post-procedure.  FLUOROSCOPY TIME:  5 minutes 33 seconds  COMPARISON:  Cholecystostomy catheter injection 09/27/2014 and earlier studies  FINDINGS: A series of images documents endoscopic catheterization of the CBD. There is opacification of the lumen the gallbladder with multiple filling defects consistent with stones. Percutaneous pigtail drain catheter stable in position. Later images document opacification of the CBD. CBD is nondilated. Partially occlusive filling defects in the distal CBD. Incomplete opacification of intrahepatic  bile ducts, which appear decompressed centrally.  IMPRESSION: 1. Cholelithiasis and choledocholithiasis, without evidence of biliary ductal dilatation. These images were submitted for radiologic interpretation only. Please see the procedural report for the amount of contrast and the fluoroscopy time utilized.   Electronically Signed   By: Lucrezia Europe M.D.   On: 11/07/2014 15:09    Micro Results      Recent Results (from the past 240 hour(s))  MRSA PCR Screening     Status: None   Collection Time: 11/07/14  3:38 PM  Result Value Ref Range Status   MRSA by PCR NEGATIVE NEGATIVE Final    Comment:        The GeneXpert MRSA Assay (FDA approved for NASAL specimens only), is one component of a comprehensive MRSA colonization surveillance program. It is not intended to diagnose MRSA infection nor to guide or monitor treatment for MRSA infections.        Today   Subjective:   Travis Ferguson today has no headache,no chest abdominal pain,no new weakness tingling or numbness, feels much better wants to go home today.   Objective:   Blood pressure 118/58, pulse 68, temperature 98.1 F (36.7 C), temperature source Oral, resp. rate 18, height 6' (1.829 m), weight 112 kg (246 lb 14.6  oz), SpO2 100 %.   Intake/Output Summary (Last 24 hours) at 11/10/14 0948 Last data filed at 11/10/14 0930  Gross per 24 hour  Intake 449.08 ml  Output    815 ml  Net -365.92 ml    Exam Awake Alert, Oriented x 3, No new F.N deficits, Normal affect [place].AT,PERRAL Supple Neck,No JVD, No cervical lymphadenopathy appriciated.  Symmetrical Chest wall movement, Good air movement bilaterally, CTAB RRR,No Gallops,Rubs or new Murmurs, No Parasternal Heave +ve B.Sounds, Abd Soft, Non tender, No organomegaly appriciated, No rebound -guarding or rigidity. Laparoscopic cholecystectomy incision site stable. No Cyanosis, Clubbing or edema, No new Rash or bruise  Data Review   CBC w Diff: Lab Results  Component  Value Date   WBC 10.0 11/10/2014   HGB 11.3* 11/10/2014   HCT 34.6* 11/10/2014   PLT 214 11/10/2014   LYMPHOPCT 5* 11/09/2014   MONOPCT 6 11/09/2014   EOSPCT 0 11/09/2014   BASOPCT 0 11/09/2014    CMP: Lab Results  Component Value Date   NA 137 11/10/2014   K 4.3 11/10/2014   CL 103 11/10/2014   CO2 27 11/10/2014   BUN 15 11/10/2014   CREATININE 0.82 11/10/2014   PROT 5.4* 11/09/2014   ALBUMIN 2.9* 11/09/2014   BILITOT 2.1* 11/09/2014   ALKPHOS 55 11/09/2014   AST 31 11/09/2014   ALT 28 11/09/2014  .   Total Time in preparing paper work, data evaluation and todays exam - 35 minutes  Thurnell Lose M.D on 11/10/2014 at 9:48 AM  [facility]   Office  [phone]

## 2014-11-14 ENCOUNTER — Other Ambulatory Visit: Payer: Self-pay | Admitting: Pulmonary Disease

## 2014-11-30 ENCOUNTER — Encounter: Payer: Self-pay | Admitting: Pulmonary Disease

## 2014-11-30 ENCOUNTER — Ambulatory Visit (INDEPENDENT_AMBULATORY_CARE_PROVIDER_SITE_OTHER): Payer: Medicare Other | Admitting: Pulmonary Disease

## 2014-11-30 VITALS — BP 130/80 | HR 95 | Ht 69.0 in | Wt 245.2 lb

## 2014-11-30 DIAGNOSIS — J42 Unspecified chronic bronchitis: Secondary | ICD-10-CM | POA: Diagnosis not present

## 2014-11-30 DIAGNOSIS — D860 Sarcoidosis of lung: Secondary | ICD-10-CM

## 2014-11-30 NOTE — Patient Instructions (Signed)
Will arrange for referral to pulmonary rehab Follow up in 4 months 

## 2014-11-30 NOTE — Progress Notes (Signed)
Chief Complaint  Patient presents with  . Follow-up    Recent gallbladder surgery. Pt states that since surgery he is having increased chest discomfort with wheezing. Denies cough. States that he feels pressure in chest when laying down and first thing in the morning. States using O2 on an as needed basis at [facility]    History of Present Illness: Travis Ferguson is a 74 y.o. male former smoker with COPD, and sarcoidosis.  He had lap chole on 5/17.  He still has a knot under his umbilicus, but otherwise has been recovering okay.  He has noticed problem with constipation.  When he feels bloated his breathing feels worse.  He feels better with his breathing after he has a bowel movement.  He has also noticed trouble with his breathing when he bends over.  He is reluctant to use oxygen at night.  He is not sure how this is helping him.  He is not having much cough.  He denies wheeze or chest pain.  He was given nebulizer treatment in place of symbicort while he was in the hospital.  He is not sure what he should be using at home.  His CXR from 11/03/14 shows stable chronic changes from sarcoidosis.  TESTS: PSG May 2008>> AHI 8  Spirometry 11/18/08>>FEV1 2.15 (61%), FEV1% 62  Bronchoscopy 07/28/12 >> focal granulomatous inflammation Spirometry 03/14/14 >> FEV1 2.10 (60%), FEV1% 57 Echo 08/19/14 >> EF 50 to 25%, grade 1 diastolic dysfx   CHEST IMAGING: CT chest 05/19/12>>superior segment LLL infiltrate, b/l hilar and mediastinal LAN with calcification CT chest 07/13/12 >> atherosclerosis, Lt hilar adenopathy, LLL superior segment narrowing, mass like opacity superior segment LLL 5.4 x 1.9 cm, multiple nodular areas LLL, diffuse GGO LLL PET scan 07/22/12 >> mildly hypermetabolic mediastinal adenopathy (SUV 5.8), superior segment nodules hypermetabolic (SUV 05.3) CT chest 08/31/12 >> decreased size of lesions CT chest 12/02/12 >> borderline mediastinal/hilar LAN, nodular septal thickening b/l, no change  superior segment LLL, centrilobular emphysema CT chest 03/19/13 >> ?slight increase in LLL consolidative change, borderline LAN CT chest 08/24/13 >> 1.9 cm Rt paratracheal LAN, calcified 2.1 cm subcarinal LAN, narrowing of mainstem bronchi, chronic infiltrate LLL CT chest 03/16/14 >> atherosclerosis, no change in mediastinal and b/l hilar LAN, no change in LLL mass like opacity, thickening of interstitium, scattered sub-pleural nodules, small Lt pleural effusion, gallstones CT chest 08/19/14 >> small b/l effusions, emphysema, no change LLL  PMHx >> Depression, PSTD, Migraines, CAD, HTN, HLD, Back pain, SDH 2010  PSHx, Medications, Allergies, Fhx, Shx reviewed.  Physical Exam: Blood pressure 130/80, pulse 95, height '5\' 9"'$  (1.753 m), weight 245 lb 3.2 oz (111.222 kg), SpO2 92 %. Body mass index is 36.19 kg/(m^2).  General - No distress ENT - no sinus tenderness, no oral exudate, no LAN Cardiac - s1s2 regular, no murmur Chest - prolonged exhalation, no wheeze/rales Back - No focal tenderness Abd - Soft, non-tender, drain in RUQ Ext - No edema Neuro - Normal strength Skin - No rashes Psych - normal mood, and behavior   Assessment/Plan:  COPD with chronic bronchitis. Stable on current regimen. Plan: - continue symbicort, and proair - it sounds like he also has pulmicort/brovana nebulizer medications at home >> I explained these are similar medications to his symbicort and he does not need to use these if he is using symbicort  Nocturnal hypoxia. I suspect this is related to sleep apnea, but could also be related COPD and sarcoidosis.  He has been reluctant  to use supplemental oxygen.  I reviewed how hypoxia can affect his health. Plan: - he will d/w his PCP about getting an overnight oximetry on room air to determine if he still needs to use supplemental oxygen at night  Pulmonary sarcoidosis. This has been stable on recent chest imaging studies. Plan: - monitor  clinically   Chesley Mires, MD [facility] Pager:  [phone] 11/30/2014, 4:13 PM

## 2014-12-09 ENCOUNTER — Telehealth (HOSPITAL_COMMUNITY): Payer: Self-pay

## 2014-12-09 NOTE — Telephone Encounter (Signed)
Called patient regarding entrance to Pulmonary Rehab.  Patient states that they are interested in attending the program.  Ioan is going to verify insurance coverage and follow up.

## 2014-12-19 ENCOUNTER — Other Ambulatory Visit: Payer: Self-pay

## 2014-12-22 ENCOUNTER — Telehealth (HOSPITAL_COMMUNITY): Payer: Self-pay

## 2014-12-23 ENCOUNTER — Encounter (HOSPITAL_COMMUNITY)
Admission: RE | Admit: 2014-12-23 | Discharge: 2014-12-23 | Disposition: A | Discharge status: Home / Self Care | Payer: Medicare Other | Source: Ambulatory Visit | Attending: Pulmonary Disease | Admitting: Pulmonary Disease

## 2014-12-23 ENCOUNTER — Encounter (HOSPITAL_COMMUNITY): Payer: Self-pay

## 2014-12-23 VITALS — BP 112/58 | HR 86 | Resp 18 | Ht 71.0 in | Wt 243.4 lb

## 2014-12-23 DIAGNOSIS — J449 Chronic obstructive pulmonary disease, unspecified: Secondary | ICD-10-CM | POA: Insufficient documentation

## 2014-12-23 DIAGNOSIS — D869 Sarcoidosis, unspecified: Secondary | ICD-10-CM | POA: Insufficient documentation

## 2014-12-23 NOTE — Progress Notes (Signed)
Travis Ferguson 74 y.o. male Pulmonary Rehab Orientation Note [phone] Patient arrived today in Cardiac and Pulmonary Rehab for orientation to Pulmonary Rehab. He was transported from [employer] via wheel chair, accompanied by his wife. He does not carry portable oxygen. Per pt, he uses 2L of oxygen at night. Color good, skin warm and dry. Patient is oriented to time and place. Patient's medical history and medications reviewed. Heart rate is normal, breath sounds clear to auscultation, no wheezes, rales, or rhonchi. Grip strength equal, strong. Distal pulses palpable. Patient reports he does take medications as prescribed. Patient states he follows a Regular diet. The patient reports no specific efforts to gain or lose weight. He states he has lost approximately 30 pounds since his gallbladder surgery in May of this year. Patient's weight will be monitored closely. Demonstration and practice of PLB using pulse oximeter. Patient able to return demonstration satisfactorily. Safety and hand hygiene in the exercise area reviewed with patient. Patient voices understanding of the information reviewed. Department expectations discussed with patient and achievable goals were set. The patient shows enthusiasm about attending the program and we look forward to working with this nice gentleman. The patient is scheduled for a 6 min walk test on Tuesday 7/5 at 3:30 and to begin exercise on Thursday 7/7 in the 1:30 class.   45 minutes was spent on a variety of activities such as assessment of the patient, obtaining baseline data including height, weight, BMI, and grip strength, verifying medical history, allergies, and current medications, and teaching patient strategies for performing tasks with less respiratory effort with emphasis on pursed lip breathing.

## 2014-12-27 ENCOUNTER — Telehealth (HOSPITAL_COMMUNITY): Payer: Self-pay | Admitting: *Deleted

## 2014-12-27 ENCOUNTER — Other Ambulatory Visit (INDEPENDENT_AMBULATORY_CARE_PROVIDER_SITE_OTHER): Payer: Medicare Other

## 2014-12-27 ENCOUNTER — Inpatient Hospital Stay (HOSPITAL_COMMUNITY): Admission: RE | Admit: 2014-12-27 | Payer: Medicare Other | Source: Ambulatory Visit

## 2014-12-27 ENCOUNTER — Telehealth: Payer: Self-pay | Admitting: Physician Assistant

## 2014-12-27 ENCOUNTER — Encounter: Payer: Self-pay | Admitting: Physician Assistant

## 2014-12-27 ENCOUNTER — Ambulatory Visit (INDEPENDENT_AMBULATORY_CARE_PROVIDER_SITE_OTHER): Payer: Medicare Other | Admitting: Physician Assistant

## 2014-12-27 VITALS — BP 138/70 | HR 96 | Ht 70.0 in | Wt 243.1 lb

## 2014-12-27 DIAGNOSIS — Z9889 Other specified postprocedural states: Secondary | ICD-10-CM | POA: Diagnosis not present

## 2014-12-27 DIAGNOSIS — R1012 Left upper quadrant pain: Secondary | ICD-10-CM

## 2014-12-27 DIAGNOSIS — K8050 Calculus of bile duct without cholangitis or cholecystitis without obstruction: Secondary | ICD-10-CM | POA: Diagnosis not present

## 2014-12-27 DIAGNOSIS — Z9049 Acquired absence of other specified parts of digestive tract: Secondary | ICD-10-CM | POA: Insufficient documentation

## 2014-12-27 DIAGNOSIS — R1011 Right upper quadrant pain: Secondary | ICD-10-CM

## 2014-12-27 LAB — CBC WITH DIFFERENTIAL/PLATELET
BASOS ABS: 0.1 10*3/uL (ref 0.0–0.1)
BASOS PCT: 1.1 % (ref 0.0–3.0)
EOS PCT: 1.9 % (ref 0.0–5.0)
Eosinophils Absolute: 0.2 10*3/uL (ref 0.0–0.7)
HEMATOCRIT: 43.3 % (ref 39.0–52.0)
HEMOGLOBIN: 14.6 g/dL (ref 13.0–17.0)
LYMPHS ABS: 1.2 10*3/uL (ref 0.7–4.0)
Lymphocytes Relative: 13.5 % (ref 12.0–46.0)
MCHC: 33.7 g/dL (ref 30.0–36.0)
MCV: 79.8 fl (ref 78.0–100.0)
MONOS PCT: 8.2 % (ref 3.0–12.0)
Monocytes Absolute: 0.7 10*3/uL (ref 0.1–1.0)
Neutro Abs: 6.9 10*3/uL (ref 1.4–7.7)
Neutrophils Relative %: 75.3 % (ref 43.0–77.0)
Platelets: 260 10*3/uL (ref 150.0–400.0)
RBC: 5.43 Mil/uL (ref 4.22–5.81)
RDW: 15.2 % (ref 11.5–15.5)
WBC: 9.2 10*3/uL (ref 4.0–10.5)

## 2014-12-27 LAB — COMPREHENSIVE METABOLIC PANEL
ALBUMIN: 3.7 g/dL (ref 3.5–5.2)
ALT: 10 U/L (ref 0–53)
AST: 13 U/L (ref 0–37)
Alkaline Phosphatase: 62 U/L (ref 39–117)
BUN: 13 mg/dL (ref 6–23)
CALCIUM: 9.6 mg/dL (ref 8.4–10.5)
CHLORIDE: 107 meq/L (ref 96–112)
CO2: 23 meq/L (ref 19–32)
CREATININE: 0.88 mg/dL (ref 0.40–1.50)
GFR: 89.88 mL/min (ref >60.00)
Glucose, Bld: 93 mg/dL (ref 70–99)
Potassium: 3.8 mEq/L (ref 3.5–5.1)
SODIUM: 139 meq/L (ref 135–145)
Total Bilirubin: 1 mg/dL (ref 0.2–1.2)
Total Protein: 6.7 g/dL (ref 6.0–8.3)

## 2014-12-27 LAB — LIPASE: LIPASE: 7 U/L — AB (ref 11.0–59.0)

## 2014-12-27 NOTE — Progress Notes (Signed)
Agree with initial assessment and plans 

## 2014-12-27 NOTE — Telephone Encounter (Signed)
Yes, his record reflects that now

## 2014-12-27 NOTE — Patient Instructions (Addendum)
Please go to the basement level to have your labs drawn.  Continue Miralax 17 grams in 8 oz of water daily.   You have been scheduled for a CT scan of the abdomen and pelvis at [facility] ([address]---this is in the same building as Press [facility]).   You are scheduled on Wed 12-28-2014 should arrive at 10:00 am  for registration. Please follow the written instructions below on the day of your exam:  WARNING: IF YOU ARE ALLERGIC TO IODINE/X-RAY DYE, PLEASE NOTIFY RADIOLOGY IMMEDIATELY AT [phone]! YOU WILL BE GIVEN A 13 HOUR PREMEDICATION PREP.  1) Do not eat or drink anything after 6:00 am ior to your test) 2) You have been given 2 bottles of oral contrast to drink. The solution may taste  better if refrigerated, but do NOT add ice or any other liquid to this solution. Shake well before drinking.    Drink 1 bottle of contrast @ 8:00 am (2 hours prior to your exam)  Drink 1 bottle of contrast @ 9:00 am (1 hour prior to your exam)  You may take any medications as prescribed with a small amount of water except for the following: Metformin, Glucophage, Glucovance, Avandamet, Riomet, Fortamet, Actoplus Met, Janumet, Glumetza or Metaglip. The above medications must be held the day of the exam AND 48 hours after the exam.  The purpose of you drinking the oral contrast is to aid in the visualization of your intestinal tract. The contrast solution may cause some diarrhea. Before your exam is started, you will be given a small amount of fluid to drink. Depending on your individual set of symptoms, you may also receive an intravenous injection of x-ray contrast/dye. Plan on being at [facility] for 30 minutes or long, depending on the type of exam you are having performed.  If you have any questions regarding your exam or if you need to reschedule, you may call the CT department at [phone] between the hours of 8:00 am and 5:00 pm,  Monday-Friday.  ________________________________________________________________________ We made you an appointment with Rexene Edison NP (Pulmonary floor) for 01-17-2015 at 9:30 am .

## 2014-12-27 NOTE — Progress Notes (Signed)
Patient ID: Travis Ferguson, male   DOB: [birth year], 74 Ferguson.o.   MRN: [id number]   Subjective:    Patient ID: Travis Ferguson, male    DOB: 04/01/1941, 74 Ferguson.o.   MRN: [id number]  HPI Travis Ferguson is a pleasant 74 year old white male known to Dr. Henrene Ferguson. He has history of sleep apnea, COPD, sarcoidosis, hypertension, obesity, congestive heart failure. He had undergone colonoscopy in February 2016 with finding of severe diverticulosis right-sided AVMs and had 4 polyps removed. These were adenomatous and hyperplastic. Shortly after that he was hospitalized with abdominal pain and found to have gangrenous cholecystitis. He had a cholecystostomy tube placed. Imaging at that time suggested common bile duct stones as well but he had an exacerbation of COPD no procedures or surgery were felt advisable. He was then seen back by Dr. Henrene Ferguson in May 2016 after a cholangiogram had been done through the cholecystostomy tube and suggested choledocholithiasis. He was set up for ERCP which was done on 11/07/2014 with removal of 2 common bile duct stones. Patient was subsequently admitted and underwent cholecystectomy on 11/09/2014. Patient comes in today with complaints of abdominal swelling and pressure which he feels is increasing his shortness of breath. He says that his breathing has been worse ever since he was discharged from the hospital at the end of May. He complains of a bandlike sensation around his upper abdomen which seems to put pressure on his chest. He has been seen by Dr. Colon Ferguson and Dr. Lucia Ferguson since discharged from the hospital. He is set up to start pulmonary rehabilitation. He also says he has been constipated pens since his surgery that if he is able to have a bowel movement this relieves some of the pressure sensation. He has been using MiraLAX recently with success. He has a known umbilical hernia which she says bothers him intermittently and at times is much larger than others. He had not been using any oxygen, he has  been using nighttime oxygen over the past month. He says he usually feels the pressure sensation in early in the a.m. after he first wakes up and says that these episodes seem to be occurring daily and are lasting longer over the past few weeks. He tells me that his O2 sats at home in the early morning's ,never lower than 90.  Review of Systems Pertinent positive and negative review of systems were noted in the above HPI section.  All other review of systems was otherwise negative.  Outpatient Encounter Prescriptions as of 12/27/2014  Medication Sig  . budesonide-formoterol (SYMBICORT) 160-4.5 MCG/ACT inhaler Inhale 2 puffs into the lungs 2 (two) times daily.  . finasteride (PROSCAR) 5 MG tablet Take 1 tablet by mouth every evening.   [name] FLUoxetine (PROZAC) 40 MG capsule Take 40 mg by mouth daily with breakfast.   . HYDROcodone-acetaminophen (NORCO/VICODIN) 5-325 MG per tablet Take 2 tablets by mouth 2 (two) times daily as needed (for pain). (Patient taking differently: Take 2 tablets by mouth every evening. )  . losartan-hydrochlorothiazide (HYZAAR) 50-12.5 MG per tablet Take 0.5 tablets by mouth every morning.   . metoprolol tartrate (LOPRESSOR) 25 MG tablet Take 25 mg by mouth daily with breakfast.   . Multiple Vitamin (MULTIVITAMIN) tablet Take 1 tablet by mouth daily.  . OXYGEN Inhale 2 L/min into the lungs at bedtime.  [name] PROAIR HFA 108 (90 BASE) MCG/ACT inhaler 1-2 PUFFS EVERY 4-6 HOURS AS NEEDED  . simvastatin (ZOCOR) 20 MG tablet Take 1 tablet by mouth every evening.   [name]  sodium chloride (OCEAN) 0.65 % SOLN nasal spray Place 1-2 sprays into both nostrils daily as needed for congestion.  . traZODone (DESYREL) 100 MG tablet Take 1 tablet (100 mg total) by mouth at bedtime.   No facility-administered encounter medications on file as of 12/27/2014.   Allergies  Allergen Reactions  . Statins Other (See Comments)    Muscle aches - tolerating Vytorin   Patient Active Problem List   Diagnosis  Date Noted  . Cholecystitis 11/07/2014  . Bile duct stone   . Abdominal fluid collection   . Common bile duct stone   . Other emphysema   . Acute on chronic respiratory failure 08/19/2014  . OSA (obstructive sleep apnea) 08/19/2014  . Preop respiratory exam 08/19/2014  . Acute kidney injury 08/19/2014  . Sarcoid   . Wheezing   . Sepsis 08/17/2014  . Gangrenous cholecystitis 08/17/2014  . Hypokalemia 08/17/2014  . COPD exacerbation 08/17/2014  . Cholecystitis, acute with cholelithiasis   . Calculus of gallbladder with acute cholecystitis and obstruction   . Choledocholithiasis   . Diastolic CHF 69/67/8938  . Atypical chest pain 03/17/2014  . Pleural effusion, left 03/17/2014  . Back pain 03/17/2014  . Encounter for screening colonoscopy 03/17/2014  . Splenic artery aneurysm 04/27/2013  . Coronary artery calcification seen on CAT scan 04/26/2013  . Essential hypertension 04/26/2013  . Hyperlipidemia 04/26/2013  . Post-nasal drip 03/15/2013  . Leg pain, bilateral 03/15/2013  . Pulmonary sarcoidosis 07/28/2012  . Obstructive sleep apnea 05/11/2007  . COPD with chronic bronchitis 05/11/2007   History   Social History  . Marital Status: Married    Spouse Name: N/A  . Number of Children: N/A  . Years of Education: N/A   Occupational History  . retired Airline pilot in [place]  . Smoking status: Former Smoker -- 0.00 packs/day for 30 years    Types: Pipe    Quit date: 06/24/2008  . Smokeless tobacco: Never Used  . Alcohol Use: No  . Drug Use: No  . Sexual Activity: Not on file   Other Topics Concern  . Not on file   Social History Narrative    [name] family history includes Travis polyps in his brother; Congestive Heart Failure in his brother; Coronary artery disease in an other family member; Heart disease in his father and mother; Stomach cancer in his paternal grandmother. There is no history of Travis cancer, Pancreatic cancer,  Rectal cancer, or Esophageal cancer.      Objective:    Filed Vitals:   12/27/14 0926  BP: 138/70  Pulse: 96    Physical Exam  well-developed elderly white male in no acute distress dyspneic at rest -blood pressure 138/70 pulse 96 height 5 foot 10 weight 243 HEENT; nontraumatic ,normocephalic,EOMI PERRLA ,sclera anicteric, Neck;Supple no JVD, Cardiovascular; regular rate and rhythm with S1-S2 no murmur or gallop, Pulmonary; decreased breath sound significantly bilaterally, Abdomen; obese, soft nondistended, no focal tenderness ,no guarding or rebound no palpable mass or hepatosplenomegaly bowel sounds are present ,incisional ports healing, Rectal; exam not done, Extremities ;no clubbing cyanosis or edema skin warm and dry, Psych ;mood and affect appropriate       Assessment & Plan:   #1 74 yo male s/p cholecystectomy and ERCP with sphincterotomy and stone extraction May 2016- this was after Gangrenous cholecystitis with cholecystostomy tube Feb 2016. 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

## 2014-12-27 NOTE — Telephone Encounter (Signed)
Can you take care of this?

## 2014-12-28 ENCOUNTER — Ambulatory Visit (INDEPENDENT_AMBULATORY_CARE_PROVIDER_SITE_OTHER)
Admission: RE | Admit: 2014-12-28 | Discharge: 2014-12-28 | Disposition: A | Discharge status: Home / Self Care | Payer: Medicare Other | Source: Ambulatory Visit | Attending: Physician Assistant | Admitting: Physician Assistant

## 2014-12-28 DIAGNOSIS — R1012 Left upper quadrant pain: Secondary | ICD-10-CM | POA: Diagnosis not present

## 2014-12-28 DIAGNOSIS — R1011 Right upper quadrant pain: Secondary | ICD-10-CM | POA: Diagnosis not present

## 2014-12-28 DIAGNOSIS — K8050 Calculus of bile duct without cholangitis or cholecystitis without obstruction: Secondary | ICD-10-CM | POA: Diagnosis not present

## 2014-12-28 DIAGNOSIS — J449 Chronic obstructive pulmonary disease, unspecified: Secondary | ICD-10-CM | POA: Diagnosis not present

## 2014-12-28 MED ORDER — IOHEXOL 300 MG/ML  SOLN
100.0000 mL | Freq: Once | INTRAMUSCULAR | Status: AC | PRN
  Administered 2014-12-28: 100 mL via INTRAVENOUS

## 2014-12-29 ENCOUNTER — Encounter: Payer: Self-pay | Admitting: Pulmonary Disease

## 2014-12-29 ENCOUNTER — Ambulatory Visit (INDEPENDENT_AMBULATORY_CARE_PROVIDER_SITE_OTHER)
Admission: RE | Admit: 2014-12-29 | Discharge: 2014-12-29 | Disposition: A | Discharge status: Home / Self Care | Payer: Medicare Other | Source: Ambulatory Visit | Attending: Pulmonary Disease | Admitting: Pulmonary Disease

## 2014-12-29 ENCOUNTER — Other Ambulatory Visit (INDEPENDENT_AMBULATORY_CARE_PROVIDER_SITE_OTHER): Payer: Medicare Other

## 2014-12-29 ENCOUNTER — Ambulatory Visit (INDEPENDENT_AMBULATORY_CARE_PROVIDER_SITE_OTHER): Payer: Medicare Other | Admitting: Pulmonary Disease

## 2014-12-29 ENCOUNTER — Encounter (HOSPITAL_COMMUNITY): Payer: Medicare Other

## 2014-12-29 VITALS — BP 130/86 | HR 91 | Temp 97.4°F | Wt 241.0 lb

## 2014-12-29 DIAGNOSIS — I10 Essential (primary) hypertension: Secondary | ICD-10-CM

## 2014-12-29 DIAGNOSIS — D860 Sarcoidosis of lung: Secondary | ICD-10-CM | POA: Diagnosis not present

## 2014-12-29 DIAGNOSIS — R0600 Dyspnea, unspecified: Secondary | ICD-10-CM

## 2014-12-29 DIAGNOSIS — I5032 Chronic diastolic (congestive) heart failure: Secondary | ICD-10-CM

## 2014-12-29 DIAGNOSIS — G4733 Obstructive sleep apnea (adult) (pediatric): Secondary | ICD-10-CM | POA: Diagnosis not present

## 2014-12-29 DIAGNOSIS — J441 Chronic obstructive pulmonary disease with (acute) exacerbation: Secondary | ICD-10-CM

## 2014-12-29 DIAGNOSIS — Z9889 Other specified postprocedural states: Secondary | ICD-10-CM

## 2014-12-29 DIAGNOSIS — Z9049 Acquired absence of other specified parts of digestive tract: Secondary | ICD-10-CM

## 2014-12-29 LAB — BASIC METABOLIC PANEL
BUN: 13 mg/dL (ref 6–23)
CO2: 24 mEq/L (ref 19–32)
CREATININE: 0.94 mg/dL (ref 0.40–1.50)
Calcium: 9.6 mg/dL (ref 8.4–10.5)
Chloride: 106 mEq/L (ref 96–112)
GFR: 83.29 mL/min (ref >60.00)
Glucose, Bld: 113 mg/dL — ABNORMAL HIGH (ref 70–99)
POTASSIUM: 4.3 meq/L (ref 3.5–5.1)
Sodium: 138 mEq/L (ref 135–145)

## 2014-12-29 LAB — BRAIN NATRIURETIC PEPTIDE: Pro B Natriuretic peptide (BNP): 534 pg/mL — ABNORMAL HIGH (ref 0.0–100.0)

## 2014-12-29 MED ORDER — METHYLPREDNISOLONE 4 MG PO TBPK: ORAL_TABLET | ORAL | Status: DC

## 2014-12-29 MED ORDER — LOSARTAN POTASSIUM 50 MG PO TABS: 50.0000 mg | ORAL_TABLET | Freq: Every day | ORAL | Status: DC

## 2014-12-29 MED ORDER — METHYLPREDNISOLONE ACETATE 80 MG/ML IJ SUSP
80.0000 mg | Freq: Once | INTRAMUSCULAR | Status: AC
  Administered 2014-12-29: 80 mg via INTRAMUSCULAR

## 2014-12-29 MED ORDER — FUROSEMIDE 20 MG PO TABS: 20.0000 mg | ORAL_TABLET | Freq: Every day | ORAL | Status: DC

## 2014-12-29 MED ORDER — LEVOFLOXACIN 500 MG PO TABS: 500.0000 mg | ORAL_TABLET | Freq: Every day | ORAL | Status: DC

## 2014-12-29 NOTE — Patient Instructions (Signed)
Today we updated your med list in our EPIC system...   Today we checked a follow up CXR & some blood work...    We will contact you w/ the results when available...   We decided to adjust your BP/ fluid meds by changing the Hyzaar to Losartan + Furosemide>>    Stop the Hyzaar (Losartan HCT)    Start the new (generic) LOSARTAN '50mg'$  one tab daily...    Start the new diuretic LASIX (Furosemide) '20mg'$  one tab each AM...        This will help get rid of the extra fluid seen on the scan 2d ago...  For your COPD exacerbation>>    Continue your inhalers...    Start the [facility] '500mg'$ - one daily for 7d...    Be sure to take a probiotic like ALIGN daily anytime you take a broad spectrum antibiotic...    We gave you a Depo shot today, start the [place] in the AM 7/7 as directed on the pack....  Call for any questions...  Keep your prev scheduled f/u appt.Marland KitchenMarland Kitchen

## 2014-12-29 NOTE — Progress Notes (Signed)
Subjective:     Patient ID: Travis Ferguson, male   DOB: Feb 06, 1941, 74 y.o.   MRN: [id number]  HPI   ~  November 30, 2014:  f/u appt w/ VS>  Travis Ferguson is a 74 y.o. male former smoker with COPD, and sarcoidosis. He had lap chole on 5/17.  He still has a knot under his umbilicus, but otherwise has been recovering okay. He has noticed problem with constipation.  When he feels bloated his breathing feels worse.  He feels better with his breathing after he has a bowel movement.  He has also noticed trouble with his breathing when he bends over. He is reluctant to use oxygen at night.  He is not sure how this is helping him. He is not having much cough.  He denies wheeze or chest pain. He was given nebulizer treatment in place of symbicort while he was in the hospital.  He is not sure what he should be using at home. His CXR from 11/03/14 shows stable chronic changes from sarcoidosis.  TESTS: PSG May 2008>> AHI 8  Spirometry 11/18/08>>FEV1 2.15 (61%), FEV1% 62  Bronchoscopy 07/28/12 >> focal granulomatous inflammation Spirometry 03/14/14 >> FEV1 2.10 (60%), FEV1% 57 Echo 02/2014 showed EF=55-60%, Gr1DD, mildMR, mild LAdil, norm RVF & norm PA press Echo 08/19/14 >> EF 98-92%, grade 1 diastolic dysfx, mild, freq PVCsMR  CHEST IMAGING: CT chest 05/19/12>>superior segment LLL infiltrate, b/l hilar and mediastinal LAN with calcification CT chest 07/13/12 >> atherosclerosis, Lt hilar adenopathy, LLL superior segment narrowing, mass like opacity superior segment LLL 5.4 x 1.9 cm, multiple nodular areas LLL, diffuse GGO LLL PET scan 07/22/12 >> mildly hypermetabolic mediastinal adenopathy (SUV 5.8), superior segment nodules hypermetabolic (SUV 11.9) CT chest 08/31/12 >> decreased size of lesions CT chest 12/02/12 >> borderline mediastinal/hilar LAN, nodular septal thickening b/l, no change superior segment LLL, centrilobular emphysema CT chest 03/19/13 >> ?slight increase in LLL consolidative change, borderline  LAN CT chest 08/24/13 >> 1.9 cm Rt paratracheal LAN, calcified 2.1 cm subcarinal LAN, narrowing of mainstem bronchi, chronic infiltrate LLL CT chest 03/16/14 >> atherosclerosis, no change in mediastinal and b/l hilar LAN, no change in LLL mass like opacity, thickening of interstitium, scattered sub-pleural nodules, small Lt pleural effusion, gallstones CT chest 08/19/14 >> small b/l effusions, emphysema, no change LLL CXR 11/03/14 >> norm heart size, chr increased markings, NAD   ~  December 29, 2014: Add-on appt w/ SN>  Chief Complaint  Patient presents with  . Acute Visit    Pt of DrSood> add-on appt requested for increased SOB...  Travis Ferguson has GOLD Stage2 COPD x20 yrs, Hx Sarcoidosis x17yr (last ACE=37 in 2014), and mild OSA on sleep study in the past> currently on Home O2 which he clearly doesn't like (using Qhs), Symbicort160-2spBid, ProairHFA rescue inhaler prn...   He is also followed by [facility]- DrBerry for coronoary calcif seen on prev CT scans w/ nonischemic low risk Myoview, chronic diastolic CHF on 2DEcho, HBP, & HL> currently on Hyzaar50-12.5 taking 1/2 daily, Metoprolol25/d, Simva20... He had GB surg 10/2014 w/ common duct stones and ER[facility] Dr[facility]ry [facility]'s been going downhill since then with increased SOB (esp in this hot weather), cough prod of a sm amt of tan mucus, no hemoptysis, feeling bad/ anxious, and sweats (but no fever recorded); O2 sats at home recorded on his oximeter have all been >90 he reports... He saw AE in GI 12/26/13 & her note is reviewed>  c/o abd pressure & swelling that was making him more SOB, laxatives helped some;  LABS showed essentially norm CBC w/ WBC=9.2, and normal CMet/ lipase;  CT Abd&Pelvis 12/28/14 showed no acute abn in the Abd but lung bases showed vascular congestion, dependent atelectasis, mod left & small right effusions that have increased in the interval...   EXAM reveals Afeb, VSS, O2sat=90% on RA at rest;  HEENT- neg,  malamaptti2;  Chest- decr BS at the bases bilat w/ few rales & sl exp wheezing;  Heart- RR gr1/6 SEM w/o r/g;  Abd- scars of LapChole, soft, nontender;  Ext- w/o c/c/e...   CXR 12/29/14 showed sl cardiomeg, prom central pulm vascularity, lungs less well aerated- incr interstitial markings ?edema vs infiltrate?  LABS today showed BNP=534,  ACE= pending IMP?PLAN>>  I suspect that Sylus has 2 processes going on here-- COPD exac w/ the incr cough, tan sput, & wheezing on exam; plus some fluid overload/ Diastolic dysfunction w/ the BNP=534, interstitial edema, and incr effusions;  I propose to treat the former w/ LEVAQUIN, Depo80, MEDROL Dosepak, and continue his Home O2, Symbicort, Proair;  For the latter I propose that we change his Hyzaar to LOSARTAN50 and LASIX20/d, low salt diet, etc... He will need f/u inn several weeks w/ recheck CXR...     Past Medical History  Diagnosis Date  . 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  . Depression   . PTSD (post-traumatic stress disorder)   . Migraine   . GERD (gastroesophageal reflux disease)   . Dyslipidemia   . Rhinitis   . Cervical disc disease   . Subdural hematoma 11/10    a. 2010 s/p surgery - diagnosed several weeks after a fall.  . Hypertension   . Coronary artery calcification seen on CAT scan   . Sarcoidosis   . Atherosclerosis   . Dyspnea     chronic  . Back pain     chronic back pain/under pain management  . Inguinal hernia     right side  . Colon polyps     adenomatous and hyperplastic  . Sepsis   . Gallstones   . Sensation problem     right side-lateral hip to knee" decreased sensation and tingling feeling" -has informed Dr. Joylene Draft, Dr. Henrene Pastor, Dr. Lucia Gaskins of this.  . Encounter for biliary drainage tube placement     remains with drainage bag to right side for  biliary drainage.  . Sinus congestion     10-27-14 at present some issues-"not bad"  . History of oxygen  administration     @ 2 l/m nasally at bedtime.  . Hiatal hernia     sensation problem ("right lateral side hip to knee").  . OSA (obstructive sleep apnea)     does not use CPAP  . Pneumonia     age 38 or 40   . Anxiety   . Frequent urination   . Arthritis   . Chronic bronchitis   . Colon polyps 08/02/2014    Tubular adenoma x 3, Hyperplastic-1    Past Surgical History  Procedure Laterality Date  . Craniotomy  11/10    right frontal"hemorrhage evacuation from fall injury"  . Video bronchoscopy  07/28/2012    Procedure: VIDEO BRONCHOSCOPY WITH FLUORO;  Surgeon: Chesley Mires, MD;  Location: [facility];  Service: Cardiopulmonary;  Laterality: Bilateral;  . Hernia repair Bilateral     right and left inguinal  . Knee  arthroscopy Right   . Lumbar laminectomy/decompression microdiscectomy Right 12/15/2012    Procedure: LUMBAR LAMINECTOMY/DECOMPRESSION MICRODISCECTOMY 1 LEVEL;  Surgeon: Elaina Hoops, MD;  Location: [facility];  Service: Neurosurgery;  Laterality: Right;  Right Lumbar four-five laminectomy/foraminotomy  . Gallbladder surgery      drain tube placed  . Ercp N/A 11/07/2014    Procedure: ENDOSCOPIC RETROGRADE CHOLANGIOPANCREATOGRAPHY (ERCP);  Surgeon: Irene Shipper, MD;  Location: [facility];  Service: Endoscopy;  Laterality: N/A;  . Cholecystectomy N/A 11/08/2014    Procedure: LAPAROSCOPIC CHOLECYSTECTOMY ;  Surgeon: Alphonsa Overall, MD;  Location: [facility];  Service: General;  Laterality: N/A;    Outpatient Encounter Prescriptions as of 12/29/2014  Medication Sig  . budesonide-formoterol (SYMBICORT) 160-4.5 MCG/ACT inhaler Inhale 2 puffs into the lungs 2 (two) times daily.  . finasteride (PROSCAR) 5 MG tablet Take 1 tablet by mouth every evening.   [name] FLUoxetine (PROZAC) 40 MG capsule Take 40 mg by mouth daily with breakfast.   . HYDROcodone-acetaminophen (NORCO/VICODIN) 5-325 MG per tablet Take 2 tablets by mouth 2 (two) times daily as needed (for pain). (Patient taking  differently: Take 2 tablets by mouth every evening. )  . metoprolol tartrate (LOPRESSOR) 25 MG tablet Take 25 mg by mouth daily with breakfast.   . Multiple Vitamin (MULTIVITAMIN) tablet Take 1 tablet by mouth daily.  . OXYGEN Inhale 2 L/min into the lungs at bedtime.  [name] PROAIR HFA 108 (90 BASE) MCG/ACT inhaler 1-2 PUFFS EVERY 4-6 HOURS AS NEEDED  . simvastatin (ZOCOR) 20 MG tablet Take 1 tablet by mouth every evening.   . sodium chloride (OCEAN) 0.65 % SOLN nasal spray Place 1-2 sprays into both nostrils daily as needed for congestion.  . traZODone (DESYREL) 100 MG tablet Take 1 tablet (100 mg total) by mouth at bedtime.  [name]  losartan-hydrochlorothiazide (HYZAAR) 50-12.5 MG per tablet Take 0.5 tablets by mouth every morning.     Allergies  Allergen Reactions  . Statins Other (See Comments)    Muscle aches - tolerating Vytorin    Current Medications, Allergies, Past Medical History, Past Surgical History, Family History, and Social History were reviewed in [employer] record.   Review of Systems            All symptoms NEG except where BOLDED >>  Constitutional:  F/C/S, fatigue, anorexia, unexpected weight change. HEENT:  HA, visual changes, hearing loss, earache, nasal symptoms, sore throat, mouth sores, hoarseness. Resp:  cough, sputum, hemoptysis; SOB, tightness, wheezing. Cardio:  CP, palpit, DOE, orthopnea, edema. GI:  N/V/D/C, blood in stool; reflux, abd pain, distention, gas. GU:  dysuria, freq, urgency, hematuria, flank pain, voiding difficulty. MS:  joint pain, swelling, tenderness, decr ROM; neck pain, back pain, etc. Neuro:  HA, tremors, seizures, dizziness, syncope, weakness, numbness, gait abn. Skin:  suspicious lesions or skin rash. Heme:  adenopathy, bruising, bleeding. Psyche:  confusion, agitation, sleep disturbance, hallucinations, anxiety, depression suicidal.   Objective:   Physical Exam      Vital Signs:  Reviewed...  General:  WD,  Obese, 74 y/o WM in NAD; alert & oriented; pleasant & cooperative... HEENT:  [place]/AT; Conjunctiva- pink, Sclera- nonicteric, EOM-wnl, PERRLA, EACs-clear, TMs-wnl; NOSE-clear; THROAT-clear & wnl. Neck:  Supple w/ fair ROM; no JVD; normal carotid impulses w/o bruits; no thyromegaly or nodules palpated; no lymphadenopathy. Chest:  decr BS at bases w/ few bibasilar rales 7 end-exp wheezing, no signs of consolidation... Heart:  Regular Rhythm; Gr1/6 SEM w/o rubs or gallops heard... Abdomen:  Soft & nontender- no guarding or rebound; normal bowel sounds; no organomegaly or masses palpated. Ext:  decrROM; +arthritic changes; no varicose veins, venous insuffic, or edema;  Pulses intact w/o bruits. Neuro:  No focal neuro deficits; gait OK & balance OK. Derm:  No lesions noted; no rash etc. Lymph:  No cervical, supraclavicular, axillary, or inguinal adenopathy palpated.   Assessment:      IMP >>  COPD w/ chronic bronchitis and acute exac, r/o pneumonia Diastolic CHF w/ fluid overload Nocturnal hypoxemia Hx Sarcoidosis  Mild OSA  HBP Coronary calcif seen on CT scans ASPVD w/ calcif vessels seen on CT scan & 47m splenic art aneurysm  PLAN >>  I suspect that JDeitrickhas 2 processes going on here-- COPD exac w/ the incr cough, tan sput, & wheezing on exam; plus some fluid overload/ Diastolic dysfunction w/ the BNP=534, interstitial edema, and incr effusions;  I propose to treat the former w/ LEVAQUIN, Depo80, MEDROL Dosepak, and continue his Home O2, Symbicort, Proair;  For the latter I propose that we change his Hyzaar to LOSARTAN50 and LASIX20/d, low salt diet, etc... He will need f/u inn several weeks w/ recheck CXR     Plan:     Patient's Medications  New Prescriptions   FUROSEMIDE (LASIX) 20 MG TABLET    Take 1 tablet (20 mg total) by mouth daily.   LEVOFLOXACIN (LEVAQUIN) 500 MG TABLET    Take 1 tablet (500 mg total) by mouth daily.   LOSARTAN (COZAAR) 50 MG TABLET    Take 1 tablet (50 mg  total) by mouth daily.   METHYLPREDNISOLONE (MEDROL DOSEPAK) 4 MG TBPK TABLET    Use as directed  Previous Medications   BUDESONIDE-FORMOTEROL (SYMBICORT) 160-4.5 MCG/ACT INHALER    Inhale 2 puffs into the lungs 2 (two) times daily.   FINASTERIDE (PROSCAR) 5 MG TABLET    Take 1 tablet by mouth every evening.    FLUOXETINE (PROZAC) 40 MG CAPSULE    Take 40 mg by mouth daily with breakfast.    HYDROCODONE-ACETAMINOPHEN (NORCO/VICODIN) 5-325 MG PER TABLET    Take 2 tablets by mouth 2 (two) times daily as needed (for pain).   METOPROLOL TARTRATE (LOPRESSOR) 25 MG TABLET    Take 25 mg by mouth daily with breakfast.    MULTIPLE VITAMIN (MULTIVITAMIN) TABLET    Take 1 tablet by mouth daily.   OXYGEN    Inhale 2 L/min into the lungs at bedtime.   PROAIR HFA 108 (90 BASE) MCG/ACT INHALER    1-2 PUFFS EVERY 4-6 HOURS AS NEEDED   SIMVASTATIN (ZOCOR) 20 MG TABLET    Take 1 tablet by mouth every evening.    SODIUM CHLORIDE (OCEAN) 0.65 % SOLN NASAL SPRAY    Place 1-2 sprays into both nostrils daily as needed for congestion.   TRAZODONE (DESYREL) 100 MG TABLET    Take 1 tablet (100 mg total) by mouth at bedtime.  Modified Medications   No medications on file  Discontinued Medications   LOSARTAN-HYDROCHLOROTHIAZIDE (HYZAAR) 50-12.5 MG PER TABLET    Take 0.5 tablets by mouth every morning.

## 2014-12-30 ENCOUNTER — Telehealth: Payer: Self-pay | Admitting: Pulmonary Disease

## 2014-12-30 LAB — ANGIOTENSIN CONVERTING ENZYME: ANGIOTENSIN-CONVERTING ENZYME: 35 U/L (ref 8–52)

## 2014-12-30 NOTE — Telephone Encounter (Signed)
Pt returned call [phone]

## 2014-12-30 NOTE — Telephone Encounter (Signed)
Spoke with pt. He reports the directions for medrol was on back of box. He needed nothing further

## 2014-12-30 NOTE — Telephone Encounter (Signed)
[id number] x1 

## 2015-01-03 ENCOUNTER — Encounter (HOSPITAL_COMMUNITY): Payer: Medicare Other

## 2015-01-05 ENCOUNTER — Encounter (HOSPITAL_COMMUNITY): Payer: Medicare Other

## 2015-01-10 ENCOUNTER — Encounter (HOSPITAL_COMMUNITY)
Admission: RE | Admit: 2015-01-10 | Discharge: 2015-01-10 | Disposition: A | Discharge status: Home / Self Care | Payer: Medicare Other | Source: Ambulatory Visit | Attending: Pulmonary Disease | Admitting: Pulmonary Disease

## 2015-01-10 DIAGNOSIS — J449 Chronic obstructive pulmonary disease, unspecified: Secondary | ICD-10-CM | POA: Diagnosis not present

## 2015-01-10 NOTE — Progress Notes (Signed)
Harout completed a Six-Minute Walk Test on 01/10/15 . Rien walked 943 feet with 0 breaks.  The patient's lowest oxygen saturation was 90 %, highest heart rate was 109 bpm , and highest blood pressure was 126/74. The patient was on room air. Patient stated that left knee pain hindered their walk test.

## 2015-01-12 ENCOUNTER — Encounter: Payer: Self-pay | Admitting: Cardiovascular Disease

## 2015-01-12 ENCOUNTER — Ambulatory Visit (INDEPENDENT_AMBULATORY_CARE_PROVIDER_SITE_OTHER): Payer: Medicare Other | Admitting: Cardiovascular Disease

## 2015-01-12 ENCOUNTER — Encounter (HOSPITAL_COMMUNITY)
Admission: RE | Admit: 2015-01-12 | Discharge: 2015-01-12 | Disposition: A | Discharge status: Home / Self Care | Payer: Medicare Other | Source: Ambulatory Visit | Attending: Pulmonary Disease | Admitting: Pulmonary Disease

## 2015-01-12 VITALS — BP 116/78 | HR 84 | Ht 72.0 in | Wt 244.1 lb

## 2015-01-12 DIAGNOSIS — I5031 Acute diastolic (congestive) heart failure: Secondary | ICD-10-CM | POA: Diagnosis not present

## 2015-01-12 DIAGNOSIS — J449 Chronic obstructive pulmonary disease, unspecified: Secondary | ICD-10-CM | POA: Diagnosis not present

## 2015-01-12 DIAGNOSIS — R0602 Shortness of breath: Secondary | ICD-10-CM

## 2015-01-12 DIAGNOSIS — I5030 Unspecified diastolic (congestive) heart failure: Secondary | ICD-10-CM | POA: Diagnosis not present

## 2015-01-12 NOTE — Patient Instructions (Signed)
Your physician recommends that you return for lab work TODAY  Your physician has requested that you have an echocardiogram in 1-2 weeks at [address] Echocardiography is a painless test that uses sound waves to create images of your heart. It provides your doctor with information about the size and shape of your heart and how well your heart's chambers and valves are working. This procedure takes approximately one hour. There are no restrictions for this procedure.  Your physician recommends that you schedule a follow-up appointment in 6 weeks with NP/PA  Your physician wants you to follow-up in: 6 months with Dr. Gwenlyn Found. You will receive a reminder letter in the mail two months in advance. If you don't receive a letter, please call our office to schedule the follow-up appointment.

## 2015-01-12 NOTE — Assessment & Plan Note (Signed)
History of grade 1 diastolic dysfunction by 2-D echo performed 08/19/14. He did have a cholecystectomy in early May and had volume overload with a BNP of 500 just several weeks ago. He responded to low-dose diabetics and feels completely improved. I'm going to repeat a 2-D echo and BNP.

## 2015-01-12 NOTE — Assessment & Plan Note (Signed)
History of hypertension blood pressure measured at 116/78. He is on losartan and metoprolol. Continue current meds at current dosing

## 2015-01-12 NOTE — Progress Notes (Signed)
Today, Travis Ferguson exercised at [employer]. [facility]. Service time was from 1330 to 1530.  The patient exercised by performing aerobic, strengthening, and stretching exercises. Oxygen saturation, heart rate, blood pressure, rate of perceived exertion, and shortness of breath were all monitored before, during, and after exercise. Travis Ferguson presented with no problems at today's exercise session.  Patient attended the Anatomy and Physiology class today.    The patient did not have an increase in workload intensity during today's exercise session.  Pre-exercise vitals: . Weight kg: 111.0 . Liters of O2: RA . SpO2: 93 . HR: 88 . BP: 100/62 . CBG: na  Exercise vitals: . Highest heartrate:  82 . Lowest oxygen saturation: 95 . Highest blood pressure: 142/70 . Liters of 02: RA  Post-exercise vitals: . SpO2: 96 . HR: 79 . BP: 108/56 . Liters of O2: RA . CBG: na Dr. Brand Males, Medical Director Dr. Coralyn Pear is immediately available during today's Pulmonary Rehab session for Travis Ferguson on 01/12/2015  at 1330 class time.  [name]

## 2015-01-12 NOTE — Assessment & Plan Note (Signed)
History of hyperlipidemia on simvastatin followed by his PCP

## 2015-01-12 NOTE — Progress Notes (Signed)
01/12/2015 Travis Ferguson   [birth year]  [id number]  Primary Physician Jerlyn Ly, MD Primary Cardiologist: Lorretta Harp MD Renae Gloss   HPI:  The patient is a very pleasant 74 year old moderately overweight married Caucasian male, father of 84, grandfather to 1 grandchild, who is a retired Catering manager. He is referred through the courtesy of Dr. Joylene Draft for cardiovascular evaluation because of chronic dyspnea and exertional chest pressure. I last saw him in the office in 8 months ago.  His cardiac risk factor profile is remarkable for remote discontinued tobacco abuse back in 1996. He smoked a pipe at that time. He has treated hypertension and hyperlipidemia. He does have a family history of heart disease with a brother who had bypass surgery in his mid 87s. He has never had a heart attack or stroke. His past surgical history is remarkable for subdural hematoma and brain surgery in the past. He has been worked up by Dr. Halford Chessman for question of lung CA, which has never been clinically documented. He did have a chest CT that showed, among other things, plaque and atherosclerosis in his coronary arteries.   The patient recently had a 2-D echocardiogram and Myoview stress test which showed normal left for function and no evidence of ischemia. He did have grade 1 diastolic dysfunction. He denies chest pain. 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  He had cholecystectomy in early May for gangrenous gallbladder with prolonged hospitalization. He was recently noted to be more short of breath in usual and a BNP was ordered that was in the 500 range. He was treated with oral diuretics resulted in marked improvement in his symptoms.    Current Outpatient Prescriptions  Medication Sig Dispense Refill  . budesonide-formoterol (SYMBICORT) 160-4.5 MCG/ACT inhaler  Inhale 2 puffs into the lungs 2 (two) times daily. 2 Inhaler 0  . finasteride (PROSCAR) 5 MG tablet Take 1 tablet by mouth every evening.     [name] FLUoxetine (PROZAC) 40 MG capsule Take 40 mg by mouth daily with breakfast.     . furosemide (LASIX) 20 MG tablet Take 1 tablet (20 mg total) by mouth daily. 30 tablet 4  . HYDROcodone-acetaminophen (NORCO/VICODIN) 5-325 MG per tablet Take 2 tablets by mouth 2 (two) times daily as needed (for pain). (Patient taking differently: Take 2 tablets by mouth every evening. ) 30 tablet 0  . levofloxacin (LEVAQUIN) 500 MG tablet Take 1 tablet (500 mg total) by mouth daily. 7 tablet 0  . losartan (COZAAR) 50 MG tablet Take 1 tablet (50 mg total) by mouth daily. 30 tablet 4  . methylPREDNISolone (MEDROL DOSEPAK) 4 MG TBPK tablet Use as directed 21 tablet 0  . metoprolol tartrate (LOPRESSOR) 25 MG tablet Take 25 mg by mouth daily with breakfast.     . Multiple Vitamin (MULTIVITAMIN) tablet Take 1 tablet by mouth daily.    . OXYGEN Inhale 2 L/min into the lungs at bedtime.    [name] PROAIR HFA 108 (90 BASE) MCG/ACT inhaler 1-2 PUFFS EVERY 4-6 HOURS AS NEEDED 8.5 Inhaler 1  . simvastatin (ZOCOR) 20 MG tablet Take 1 tablet by mouth every evening.     . sodium chloride (OCEAN) 0.65 % SOLN nasal spray Place 1-2 sprays into both nostrils daily as needed for congestion.    . traZODone (DESYREL) 100 MG tablet Take 1 tablet (100 mg  total) by mouth at bedtime. 15 tablet 0   No current facility-administered medications for this visit.    Allergies  Allergen Reactions  . Statins Other (See Comments)    Muscle aches - tolerating Vytorin    History   Social History  . Marital Status: Married    Spouse Name: N/A  . Number of Children: N/A  . Years of Education: N/A   Occupational History  . retired Airline pilot in [place]  . Smoking status: Former Smoker -- 0.00 packs/day for 30 years    Types: Pipe    Quit date: 06/24/2008  . Smokeless  tobacco: Never Used  . Alcohol Use: No  . Drug Use: No  . Sexual Activity: Not on file   Other Topics Concern  . Not on file   Social History Narrative     Review of Systems: General: negative for chills, fever, night sweats or weight changes.  Cardiovascular: negative for chest pain, dyspnea on exertion, edema, orthopnea, palpitations, paroxysmal nocturnal dyspnea or shortness of breath Dermatological: negative for rash Respiratory: negative for cough or wheezing Urologic: negative for hematuria Abdominal: negative for nausea, vomiting, diarrhea, bright red blood per rectum, melena, or hematemesis Neurologic: negative for visual changes, syncope, or dizziness All other systems reviewed and are otherwise negative except as noted above.    Blood pressure 116/78, pulse 84, height 6' (1.829 m), weight 244 lb 1.6 oz (110.723 kg).  General appearance: alert and no distress Neck: no adenopathy, no carotid bruit, no JVD, supple, symmetrical, trachea midline and thyroid not enlarged, symmetric, no tenderness/mass/nodules Lungs: clear to auscultation bilaterally Heart: regular rate and rhythm, S1, S2 normal, no murmur, click, rub or gallop Extremities: extremities normal, atraumatic, no cyanosis or edema  EKG not performed today  ASSESSMENT AND PLAN:   Hyperlipidemia History of hyperlipidemia on simvastatin followed by his PCP  Essential hypertension History of hypertension blood pressure measured at 116/78. He is on losartan and metoprolol. Continue current meds at current dosing  Diastolic CHF History of grade 1 diastolic dysfunction by 2-D echo performed 08/19/14. He did have a cholecystectomy in early May and had volume overload with a BNP of 500 just several weeks ago. He responded to low-dose diabetics and feels completely improved. I'm going to repeat a 2-D echo and BNP.      Lorretta Harp MD FACP,FACC,FAHA, [facility] 01/12/2015 9:38 AM

## 2015-01-13 ENCOUNTER — Telehealth: Payer: Self-pay | Admitting: *Deleted

## 2015-01-13 ENCOUNTER — Ambulatory Visit (HOSPITAL_COMMUNITY)
Admission: RE | Admit: 2015-01-13 | Discharge: 2015-01-13 | Disposition: A | Discharge status: Home / Self Care | Payer: Medicare Other | Source: Ambulatory Visit | Attending: Cardiovascular Disease | Admitting: Cardiovascular Disease

## 2015-01-13 DIAGNOSIS — Z87891 Personal history of nicotine dependence: Secondary | ICD-10-CM | POA: Diagnosis not present

## 2015-01-13 DIAGNOSIS — I358 Other nonrheumatic aortic valve disorders: Secondary | ICD-10-CM | POA: Insufficient documentation

## 2015-01-13 DIAGNOSIS — I5031 Acute diastolic (congestive) heart failure: Secondary | ICD-10-CM | POA: Diagnosis not present

## 2015-01-13 DIAGNOSIS — E785 Hyperlipidemia, unspecified: Secondary | ICD-10-CM | POA: Diagnosis not present

## 2015-01-13 DIAGNOSIS — I10 Essential (primary) hypertension: Secondary | ICD-10-CM | POA: Diagnosis not present

## 2015-01-13 DIAGNOSIS — I371 Nonrheumatic pulmonary valve insufficiency: Secondary | ICD-10-CM | POA: Insufficient documentation

## 2015-01-13 DIAGNOSIS — I340 Nonrheumatic mitral (valve) insufficiency: Secondary | ICD-10-CM | POA: Insufficient documentation

## 2015-01-13 DIAGNOSIS — I509 Heart failure, unspecified: Secondary | ICD-10-CM | POA: Diagnosis present

## 2015-01-13 DIAGNOSIS — Z8249 Family history of ischemic heart disease and other diseases of the circulatory system: Secondary | ICD-10-CM | POA: Insufficient documentation

## 2015-01-13 LAB — BRAIN NATRIURETIC PEPTIDE: Brain Natriuretic Peptide: 425.7 pg/mL — ABNORMAL HIGH (ref 0.0–100.0)

## 2015-01-13 NOTE — Telephone Encounter (Signed)
Here for echo, wanted to see if BNP results posted yet. Informed pt still in process, will alert when resulted.

## 2015-01-13 NOTE — Progress Notes (Signed)
2D Echocardiogram Complete.  01/13/2015   Deliah Boston, RDCS   Preliminary Technician Findings:  The Ejection Fraction is Severely Decreased from Prior Echo (08-19-14), although there are multiple EKG abnormalities which could be falsely lowering the measurement.

## 2015-01-16 NOTE — Telephone Encounter (Signed)
Pt informed of results.

## 2015-01-17 ENCOUNTER — Encounter (HOSPITAL_COMMUNITY)
Admission: RE | Admit: 2015-01-17 | Discharge: 2015-01-17 | Disposition: A | Discharge status: Home / Self Care | Payer: Medicare Other | Source: Ambulatory Visit | Attending: Pulmonary Disease | Admitting: Pulmonary Disease

## 2015-01-17 ENCOUNTER — Telehealth: Payer: Self-pay | Admitting: Cardiovascular Disease

## 2015-01-17 ENCOUNTER — Encounter: Payer: Self-pay | Admitting: Adult Health

## 2015-01-17 ENCOUNTER — Ambulatory Visit (INDEPENDENT_AMBULATORY_CARE_PROVIDER_SITE_OTHER): Payer: Medicare Other | Admitting: Adult Health

## 2015-01-17 ENCOUNTER — Ambulatory Visit (INDEPENDENT_AMBULATORY_CARE_PROVIDER_SITE_OTHER)
Admission: RE | Admit: 2015-01-17 | Discharge: 2015-01-17 | Disposition: A | Discharge status: Home / Self Care | Payer: Medicare Other | Source: Ambulatory Visit | Attending: Adult Health | Admitting: Adult Health

## 2015-01-17 VITALS — BP 98/60 | HR 81 | Temp 98.0°F | Ht 71.0 in | Wt 240.0 lb

## 2015-01-17 DIAGNOSIS — J449 Chronic obstructive pulmonary disease, unspecified: Secondary | ICD-10-CM

## 2015-01-17 DIAGNOSIS — I5033 Acute on chronic diastolic (congestive) heart failure: Secondary | ICD-10-CM

## 2015-01-17 DIAGNOSIS — D860 Sarcoidosis of lung: Secondary | ICD-10-CM | POA: Diagnosis not present

## 2015-01-17 NOTE — Assessment & Plan Note (Addendum)
Recent flare with fluid overload  Now with probable systolic and diastolic CHF with echo showing EF 25-30%  Improved with diureiss , remain on BB, ARB .  Advised to follow up with Cards for CHF /echo results

## 2015-01-17 NOTE — Progress Notes (Signed)
Reviewed and agree with assessment/plan. 

## 2015-01-17 NOTE — Telephone Encounter (Signed)
Patient would like his Echo results--ok to leave a message

## 2015-01-17 NOTE — Progress Notes (Signed)
Subjective:     Patient ID: Travis Ferguson, male   DOB: August 12, 1940, 74 y.o.   MRN: [id number]  HPI  Travis Ferguson is a 74 y.o. male former smoker with COPD, and sarcoidosis.   TESTS: PSG May 2008>> AHI 8  Spirometry 11/18/08>>FEV1 2.15 (61%), FEV1% 62  Bronchoscopy 07/28/12 >> focal granulomatous inflammation Spirometry 03/14/14 >> FEV1 2.10 (60%), FEV1% 57 Echo 02/2014 showed EF=55-60%, Gr1DD, mildMR, mild LAdil, norm RVF & norm PA press Echo 08/19/14 >> EF 46-27%, grade 1 diastolic dysfx, mild, freq PVCsMR\ Echo on 7/22 /16 showed EF of 25-30%, Diffuse hypokinesis , gr 1 DD ,   CHEST IMAGING: CT chest 05/19/12>>superior segment LLL infiltrate, b/l hilar and mediastinal LAN with calcification CT chest 07/13/12 >> atherosclerosis, Lt hilar adenopathy, LLL superior segment narrowing, mass like opacity superior segment LLL 5.4 x 1.9 cm, multiple nodular areas LLL, diffuse GGO LLL PET scan 07/22/12 >> mildly hypermetabolic mediastinal adenopathy (SUV 5.8), superior segment nodules hypermetabolic (SUV 03.5) CT chest 08/31/12 >> decreased size of lesions CT chest 12/02/12 >> borderline mediastinal/hilar LAN, nodular septal thickening b/l, no change superior segment LLL, centrilobular emphysema CT chest 03/19/13 >> ?slight increase in LLL consolidative change, borderline LAN CT chest 08/24/13 >> 1.9 cm Rt paratracheal LAN, calcified 2.1 cm subcarinal LAN, narrowing of mainstem bronchi, chronic infiltrate LLL CT chest 03/16/14 >> atherosclerosis, no change in mediastinal and b/l hilar LAN, no change in LLL mass like opacity, thickening of interstitium, scattered sub-pleural nodules, small Lt pleural effusion, gallstones CT chest 08/19/14 >> small b/l effusions, emphysema, no change LLL CXR 11/03/14 >> norm heart size, chr increased markings, NAD  01/17/2015 Follow up : COPD /Sarcoid /CHF  Seen 3 weeks ago for worsening breathing with COPD flare  Given Levaquin and steroid taper  He appeared to have fluid  overload/diastolic CHF decompensated  BNP 534 . CXR showed interstitial edema  He was changed to Lasix . Breathing improved after fluid removed.  Feels his breathing improved 200%.   He was seen by Cards on 7/21 with repeat echo ordered.  Echo on 7/22 showed EF of 25%, Diffuse hypokinesis , gr 1 DD ,  Denies leg swelling.or orthopnea.    He is very frustrated with how bad he feels all the time.  He complains that no one can fix all his problems.  Has multiple compliants of back pian, urinary frequency , leg pain, fatigue .  We discussed these issues and how lung and heart problems can affect these and  Vice versa. Suggested he discus with PCP joint pain and urinary issues .     Past Medical History  Diagnosis Date  . 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  . Depression   . PTSD (post-traumatic stress disorder)   . Migraine   . GERD (gastroesophageal reflux disease)   . Dyslipidemia   . Rhinitis   . Cervical disc disease   . Subdural hematoma 11/10    a. 2010 s/p surgery - diagnosed several weeks after a fall.  . Hypertension   . Coronary artery calcification seen on CAT scan   . Sarcoidosis   . Atherosclerosis   . Dyspnea     chronic  . Back pain     chronic back pain/under pain management  . Inguinal hernia     right side  . Colon polyps     adenomatous and hyperplastic  . Sepsis   . Gallstones   .  Sensation problem     right side-lateral hip to knee" decreased sensation and tingling feeling" -has informed Dr. Joylene Draft, Dr. Henrene Pastor, Dr. Lucia Gaskins of this.  . Encounter for biliary drainage tube placement     remains with drainage bag to right side for  biliary drainage.  . Sinus congestion     10-27-14 at present some issues-"not bad"  . History of oxygen administration     @ 2 l/m nasally at bedtime.  . Hiatal hernia     sensation problem ("right lateral side hip to knee").  . OSA (obstructive sleep apnea)     does  not use CPAP  . Pneumonia     age 25 or 79   . Anxiety   . Frequent urination   . Arthritis   . Chronic bronchitis   . Colon polyps 08/02/2014    Tubular adenoma x 3, Hyperplastic-1  . Diastolic dysfunction     grade 1 diastolic dysfunction on 2-D echo    Past Surgical History  Procedure Laterality Date  . Craniotomy  11/10    right frontal"hemorrhage evacuation from fall injury"  . Video bronchoscopy  07/28/2012    Procedure: VIDEO BRONCHOSCOPY WITH FLUORO;  Surgeon: Chesley Mires, MD;  Location: [facility];  Service: Cardiopulmonary;  Laterality: Bilateral;  . Hernia repair Bilateral     right and left inguinal  . Knee arthroscopy Right   . Lumbar laminectomy/decompression microdiscectomy Right 12/15/2012    Procedure: LUMBAR LAMINECTOMY/DECOMPRESSION MICRODISCECTOMY 1 LEVEL;  Surgeon: Elaina Hoops, MD;  Location: [facility];  Service: Neurosurgery;  Laterality: Right;  Right Lumbar four-five laminectomy/foraminotomy  . Gallbladder surgery      drain tube placed  . Ercp N/A 11/07/2014    Procedure: ENDOSCOPIC RETROGRADE CHOLANGIOPANCREATOGRAPHY (ERCP);  Surgeon: Irene Shipper, MD;  Location: [facility];  Service: Endoscopy;  Laterality: N/A;  . Cholecystectomy N/A 11/08/2014    Procedure: LAPAROSCOPIC CHOLECYSTECTOMY ;  Surgeon: Alphonsa Overall, MD;  Location: [facility];  Service: General;  Laterality: N/A;     Allergies  Allergen Reactions  . Statins Other (See Comments)    Muscle aches - tolerating Vytorin    Current Medications, Allergies, Past Medical History, Past Surgical History, Family History, and Social History were reviewed in [employer] record.   Review of Systems            Constitutional:   No  weight loss, night sweats,  Fevers, chills,  +atigue, or  lassitude.  HEENT:   No headaches,  Difficulty swallowing,  Tooth/dental problems, or  Sore throat,                No sneezing, itching, ear ache, nasal congestion, post nasal drip,   CV:   No chest pain,  Orthopnea, PND, swelling in lower extremities, anasarca, dizziness, palpitations, syncope.   GI  No heartburn, indigestion, abdominal pain, nausea, vomiting, diarrhea, change in bowel habits, loss of appetite, bloody stools.   Resp:      No change in color of mucus.  No wheezing.  No chest wall deformity  Skin: no rash or lesions.  GU: no dysuria, change in color of urine,    No flank pain, no hematuria   MS: +joint pain.  No decreased range of motion.  +chronic back pain .  Psych:  No change in mood or affect. No depression or anxiety.  No memory loss.        Objective:   Physical Exam  Vital Signs:  Reviewed...  GEN: A/Ox3; pleasant , NAD, elderly and chronically ill appearing   HEENT:  [place]/AT,  EACs-clear, TMs-wnl, NOSE-clear, THROAT-clear, no lesions, no postnasal drip or exudate noted.   NECK:  Supple w/ fair ROM; no JVD; normal carotid impulses w/o bruits; no thyromegaly or nodules palpated; no lymphadenopathy.  RESP  Decreased BS in bases , no accessory muscle use, no dullness to percussion  CARD:  RRR, 1/6 SM  , no peripheral edema, pulses intact, no cyanosis or clubbing.  GI:   Soft & nt; nml bowel sounds; no organomegaly or masses detected.  Musco: Warm bil, no deformities or joint swelling noted.  Walks with cane   Neuro: alert, no focal deficits noted.    Skin: Warm, no lesions or rashes    Assessment:

## 2015-01-17 NOTE — Patient Instructions (Signed)
Continue on current regimen .  Chest xray today .  Follow up with Cardiology for echo results.  follow up Dr. Halford Chessman  In 6 weeks and As needed

## 2015-01-17 NOTE — Assessment & Plan Note (Signed)
Recent flare  Improved with steroids

## 2015-01-17 NOTE — Progress Notes (Signed)
Quick Note:  Called and spoke with pt. Reviewed results and recs. Pt voiced understanding and had no further questions. ______ 

## 2015-01-17 NOTE — Telephone Encounter (Signed)
Spoke with patient's wife (who is listed on DPR) and gave echo results.  Appt made for 7/29 with Dr Gwenlyn Found.

## 2015-01-17 NOTE — Progress Notes (Signed)
Today, Travis Ferguson exercised at [employer]. [facility]. Service time was from 1330 to 1525.  The patient exercised by performing aerobic, strengthening, and stretching exercises. Oxygen saturation, heart rate, blood pressure, rate of perceived exertion, and shortness of breath were all monitored before, during, and after exercise. Travis Ferguson presented with no problems at today's exercise session.Reviewed pursed lip and diaphragmatic breathing with Travis Ferguson today.  The patient did not have an increase in workload intensity during today's exercise session.  Pre-exercise vitals: . Weight kg: 110.4 . Liters of O2: RA . SpO2: 97 . HR: 64 . BP: 114/82 . CBG: NA  Exercise vitals: . Highest heartrate:  86 . Lowest oxygen saturation: 94 . Highest blood pressure: 122/60 . Liters of 02: RA  Post-exercise vitals: . SpO2: 95 . HR: 63 . BP: 104/60 . Liters of O2: RA . CBG: NA .

## 2015-01-17 NOTE — Assessment & Plan Note (Signed)
Recent flare now resolved Check ccxr today  Cont on current regimen

## 2015-01-17 NOTE — Telephone Encounter (Signed)
WILL defer to  Brita Romp RN

## 2015-01-19 ENCOUNTER — Encounter (HOSPITAL_COMMUNITY): Payer: Medicare Other

## 2015-01-20 ENCOUNTER — Encounter: Payer: Self-pay | Admitting: Cardiovascular Disease

## 2015-01-20 ENCOUNTER — Ambulatory Visit (INDEPENDENT_AMBULATORY_CARE_PROVIDER_SITE_OTHER): Payer: Medicare Other | Admitting: Cardiovascular Disease

## 2015-01-20 DIAGNOSIS — D689 Coagulation defect, unspecified: Secondary | ICD-10-CM | POA: Diagnosis not present

## 2015-01-20 DIAGNOSIS — Z79899 Other long term (current) drug therapy: Secondary | ICD-10-CM | POA: Diagnosis not present

## 2015-01-20 DIAGNOSIS — I519 Heart disease, unspecified: Secondary | ICD-10-CM | POA: Diagnosis not present

## 2015-01-20 DIAGNOSIS — R5383 Other fatigue: Secondary | ICD-10-CM

## 2015-01-20 LAB — BASIC METABOLIC PANEL
BUN: 18 mg/dL (ref 7–25)
CALCIUM: 9.4 mg/dL (ref 8.6–10.3)
CHLORIDE: 105 mmol/L (ref 98–110)
CO2: 23 mmol/L (ref 20–31)
Creat: 0.96 mg/dL (ref 0.70–1.18)
Glucose, Bld: 90 mg/dL (ref 65–99)
Potassium: 4.4 mmol/L (ref 3.5–5.3)
SODIUM: 137 mmol/L (ref 135–146)

## 2015-01-20 LAB — CBC
HCT: 41.7 % (ref 39.0–52.0)
HEMOGLOBIN: 14.2 g/dL (ref 13.0–17.0)
MCH: 26.9 pg (ref 26.0–34.0)
MCHC: 34.1 g/dL (ref 30.0–36.0)
MCV: 79 fL (ref 78.0–100.0)
MPV: 10 fL (ref 8.6–12.4)
Platelets: 240 10*3/uL (ref 150–400)
RBC: 5.28 MIL/uL (ref 4.22–5.81)
RDW: 15.1 % (ref 11.5–15.5)
WBC: 8.3 10*3/uL (ref 4.0–10.5)

## 2015-01-20 LAB — APTT: aPTT: 30 seconds (ref 24–37)

## 2015-01-20 LAB — PROTIME-INR
INR: 0.99 (ref <1.50)
PROTHROMBIN TIME: 13.1 s (ref 11.6–15.2)

## 2015-01-20 LAB — TSH: TSH: 2.016 u[IU]/mL (ref 0.350–4.500)

## 2015-01-20 NOTE — Progress Notes (Signed)
Mr. Deeg returns for follow-up of his 2-D echocardiogram performed 01/13/15 which revealed an ejection fraction of 25-30% with diffuse hypokinesis. There was akinesis of the entire inferolateral and inferior myocardium and akinesis of the mid anteroseptal myocardium. This is significantly different than his echo performed 08/19/14 which revealed normal LV systolic function. I'm concerned that given his symptoms, his CT findings of coronary atherosclerosis and his new decline in LV function that he does have significant coronary artery disease. I'm going to proceed with outpatient right and left heart cath on Monday.The patient understands that risks included but are not limited to stroke (1 in 1000), death (1 in 79), kidney failure [usually temporary] (1 in 500), bleeding (1 in 200), allergic reaction [possibly serious] (1 in 200). The patient understands and agrees to proceed

## 2015-01-20 NOTE — Assessment & Plan Note (Signed)
Travis Ferguson returns for follow-up of his 2-D echocardiogram performed 01/13/15 which revealed an ejection fraction of 25-30% with diffuse hypokinesis. There was akinesis of the entire inferolateral and inferior myocardium and akinesis of the mid anteroseptal myocardium. This is significantly different than his echo performed 08/19/14 which revealed normal LV systolic function. I'm concerned that given his symptoms, his CT findings of coronary atherosclerosis and his new decline in LV function that he does have significant coronary artery disease. I'm going to proceed with outpatient right and left heart cath on Monday.The patient understands that risks included but are not limited to stroke (1 in 1000), death (1 in 52), kidney failure [usually temporary] (1 in 500), bleeding (1 in 200), allergic reaction [possibly serious] (1 in 200). The patient understands and agrees to proceed

## 2015-01-20 NOTE — Patient Instructions (Signed)
Your physician has requested that you have a right and left cardiac catheterization. Cardiac catheterization is used to diagnose and/or treat various heart conditions. Doctors may recommend this procedure for a number of different reasons. The most common reason is to evaluate chest pain. Chest pain can be a symptom of coronary artery disease (CAD), and cardiac catheterization can show whether plaque is narrowing or blocking your heart's arteries. This procedure is also used to evaluate the valves, as well as measure the blood flow and oxygen levels in different parts of your heart. For further information please visit [email or web address].   Following your catheterization, you will not be allowed to drive for 3 days.  No lifting, pushing, or pulling greater that 10 pounds is allowed for 1 week.  You will be required to have the following tests prior to the procedure:  1. Blood work-the blood work can be done no more than 7 days prior to the procedure.  It can be done at any [facility] lab.  There is one downstairs on the first floor of this building and one in the [facility] building [phone]).

## 2015-01-23 ENCOUNTER — Encounter (HOSPITAL_COMMUNITY): Payer: Self-pay | Admitting: Cardiovascular Disease

## 2015-01-23 ENCOUNTER — Encounter (HOSPITAL_COMMUNITY)
Admission: RE | Disposition: A | Discharge status: Home / Self Care | Payer: Self-pay | Source: Ambulatory Visit | Attending: Cardiovascular Disease

## 2015-01-23 ENCOUNTER — Telehealth: Payer: Self-pay | Admitting: Cardiovascular Disease

## 2015-01-23 ENCOUNTER — Ambulatory Visit (HOSPITAL_COMMUNITY)
Admission: RE | Admit: 2015-01-23 | Discharge: 2015-01-23 | Disposition: A | Discharge status: Home / Self Care | Payer: Medicare Other | Source: Ambulatory Visit | Attending: Cardiovascular Disease | Admitting: Cardiovascular Disease

## 2015-01-23 DIAGNOSIS — R5383 Other fatigue: Secondary | ICD-10-CM

## 2015-01-23 DIAGNOSIS — I10 Essential (primary) hypertension: Secondary | ICD-10-CM | POA: Insufficient documentation

## 2015-01-23 DIAGNOSIS — Z87891 Personal history of nicotine dependence: Secondary | ICD-10-CM | POA: Insufficient documentation

## 2015-01-23 DIAGNOSIS — I5022 Chronic systolic (congestive) heart failure: Secondary | ICD-10-CM | POA: Diagnosis present

## 2015-01-23 DIAGNOSIS — D860 Sarcoidosis of lung: Secondary | ICD-10-CM | POA: Insufficient documentation

## 2015-01-23 DIAGNOSIS — Z8249 Family history of ischemic heart disease and other diseases of the circulatory system: Secondary | ICD-10-CM | POA: Diagnosis not present

## 2015-01-23 DIAGNOSIS — I2510 Atherosclerotic heart disease of native coronary artery without angina pectoris: Secondary | ICD-10-CM | POA: Diagnosis not present

## 2015-01-23 DIAGNOSIS — E785 Hyperlipidemia, unspecified: Secondary | ICD-10-CM | POA: Diagnosis not present

## 2015-01-23 DIAGNOSIS — I519 Heart disease, unspecified: Secondary | ICD-10-CM

## 2015-01-23 DIAGNOSIS — E663 Overweight: Secondary | ICD-10-CM | POA: Diagnosis not present

## 2015-01-23 DIAGNOSIS — D689 Coagulation defect, unspecified: Secondary | ICD-10-CM

## 2015-01-23 DIAGNOSIS — Z79899 Other long term (current) drug therapy: Secondary | ICD-10-CM

## 2015-01-23 HISTORY — PX: CARDIAC CATHETERIZATION: SHX172

## 2015-01-23 LAB — POCT I-STAT 3, VENOUS BLOOD GAS (G3P V)
Acid-base deficit: 1 mmol/L (ref 0.0–2.0)
Bicarbonate: 23.7 mEq/L (ref 20.0–24.0)
O2 SAT: 68 %
TCO2: 25 mmol/L (ref 0–100)
pCO2, Ven: 40.4 mmHg — ABNORMAL LOW (ref 45.0–50.0)
pH, Ven: 7.375 — ABNORMAL HIGH (ref 7.250–7.300)
pO2, Ven: 36 mmHg (ref 30.0–45.0)

## 2015-01-23 LAB — POCT I-STAT 3, ART BLOOD GAS (G3+)
Acid-base deficit: 2 mmol/L (ref 0.0–2.0)
BICARBONATE: 21.9 meq/L (ref 20.0–24.0)
O2 SAT: 94 %
PCO2 ART: 34.6 mmHg — AB (ref 35.0–45.0)
TCO2: 23 mmol/L (ref 0–100)
pH, Arterial: 7.409 (ref 7.350–7.450)
pO2, Arterial: 69 mmHg — ABNORMAL LOW (ref 80.0–100.0)

## 2015-01-23 SURGERY — RIGHT/LEFT HEART CATH AND CORONARY ANGIOGRAPHY
Anesthesia: LOCAL

## 2015-01-23 MED ORDER — ONDANSETRON HCL 4 MG/2ML IJ SOLN: 4.0000 mg | Freq: Four times a day (QID) | INTRAMUSCULAR | Status: DC | PRN

## 2015-01-23 MED ORDER — FENTANYL CITRATE (PF) 100 MCG/2ML IJ SOLN
INTRAMUSCULAR | Status: DC | PRN
  Administered 2015-01-23: 25 ug via INTRAVENOUS

## 2015-01-23 MED ORDER — MIDAZOLAM HCL 2 MG/2ML IJ SOLN
INTRAMUSCULAR | Status: AC
  Filled 2015-01-23: qty 2

## 2015-01-23 MED ORDER — SODIUM CHLORIDE 0.9 % WEIGHT BASED INFUSION: 1.0000 mL/kg/h | INTRAVENOUS | Status: DC

## 2015-01-23 MED ORDER — FENTANYL CITRATE (PF) 100 MCG/2ML IJ SOLN
INTRAMUSCULAR | Status: AC
  Filled 2015-01-23: qty 2

## 2015-01-23 MED ORDER — SODIUM CHLORIDE 0.9 % IJ SOLN: 3.0000 mL | INTRAMUSCULAR | Status: DC | PRN

## 2015-01-23 MED ORDER — LIDOCAINE HCL (PF) 1 % IJ SOLN
INTRAMUSCULAR | Status: AC
  Filled 2015-01-23: qty 30

## 2015-01-23 MED ORDER — ASPIRIN 81 MG PO CHEW: 81.0000 mg | CHEWABLE_TABLET | Freq: Every day | ORAL | Status: DC

## 2015-01-23 MED ORDER — SODIUM CHLORIDE 0.9 % IV SOLN: 250.0000 mL | INTRAVENOUS | Status: DC | PRN

## 2015-01-23 MED ORDER — IOHEXOL 350 MG/ML SOLN
INTRAVENOUS | Status: DC | PRN
  Administered 2015-01-23: 85 mL via INTRA_ARTERIAL

## 2015-01-23 MED ORDER — ACETAMINOPHEN 325 MG PO TABS: 650.0000 mg | ORAL_TABLET | ORAL | Status: DC | PRN

## 2015-01-23 MED ORDER — SODIUM CHLORIDE 0.9 % IV SOLN
INTRAVENOUS | Status: DC
  Administered 2015-01-23: 08:00:00 via INTRAVENOUS

## 2015-01-23 MED ORDER — LIDOCAINE HCL (PF) 1 % IJ SOLN
INTRAMUSCULAR | Status: DC | PRN
  Administered 2015-01-23: 10:00:00

## 2015-01-23 MED ORDER — SODIUM CHLORIDE 0.9 % IJ SOLN: 3.0000 mL | Freq: Two times a day (BID) | INTRAMUSCULAR | Status: DC

## 2015-01-23 MED ORDER — ASPIRIN 81 MG PO CHEW
81.0000 mg | CHEWABLE_TABLET | ORAL | Status: AC
  Administered 2015-01-23: 81 mg via ORAL

## 2015-01-23 MED ORDER — SODIUM CHLORIDE 0.9 % WEIGHT BASED INFUSION: 1.0000 mL/kg/h | INTRAVENOUS | Status: AC

## 2015-01-23 MED ORDER — ASPIRIN 81 MG PO CHEW
CHEWABLE_TABLET | ORAL | Status: AC
  Administered 2015-01-23: 81 mg via ORAL
  Filled 2015-01-23: qty 1

## 2015-01-23 MED ORDER — SODIUM CHLORIDE 0.9 % WEIGHT BASED INFUSION: 3.0000 mL/kg/h | INTRAVENOUS | Status: DC

## 2015-01-23 MED ORDER — MIDAZOLAM HCL 2 MG/2ML IJ SOLN
INTRAMUSCULAR | Status: DC | PRN
  Administered 2015-01-23: 1 mg via INTRAVENOUS

## 2015-01-23 MED ORDER — MORPHINE SULFATE 2 MG/ML IJ SOLN: 2.0000 mg | INTRAMUSCULAR | Status: DC | PRN

## 2015-01-23 SURGICAL SUPPLY — 12 items
CATH INFINITI 5FR JL5 (CATHETERS) ×1 IMPLANT
CATH INFINITI 5FR MULTPACK ANG (CATHETERS) ×2 IMPLANT
CATH SWAN GANZ 7F STRAIGHT (CATHETERS) ×1 IMPLANT
KIT HEART LEFT (KITS) ×2 IMPLANT
KIT HEART RIGHT NAMIC (KITS) ×2 IMPLANT
PACK CARDIAC CATHETERIZATION (CUSTOM PROCEDURE TRAY) ×2 IMPLANT
SHEATH PINNACLE 5F 10CM (SHEATH) ×2 IMPLANT
SHEATH PINNACLE 7F 10CM (SHEATH) ×1 IMPLANT
SYR MEDRAD MARK V 150ML (SYRINGE) ×2 IMPLANT
TRANSDUCER W/STOPCOCK (MISCELLANEOUS) ×4 IMPLANT
WIRE EMERALD 3MM-J .025X260CM (WIRE) ×1 IMPLANT
WIRE EMERALD 3MM-J .035X150CM (WIRE) ×1 IMPLANT

## 2015-01-23 NOTE — Discharge Instructions (Signed)

## 2015-01-23 NOTE — H&P (View-Only) (Signed)
Travis Ferguson returns for follow-up of his 2-D echocardiogram performed 01/13/15 which revealed an ejection fraction of 25-30% with diffuse hypokinesis. There was akinesis of the entire inferolateral and inferior myocardium and akinesis of the mid anteroseptal myocardium. This is significantly different than his echo performed 08/19/14 which revealed normal LV systolic function. I'm concerned that given his symptoms, his CT findings of coronary atherosclerosis and his new decline in LV function that he does have significant coronary artery disease. I'm going to proceed with outpatient right and left heart cath on Monday.The patient understands that risks included but are not limited to stroke (1 in 1000), death (1 in 89), kidney failure [usually temporary] (1 in 500), bleeding (1 in 200), allergic reaction [possibly serious] (1 in 200). The patient understands and agrees to proceed

## 2015-01-23 NOTE — Progress Notes (Signed)
Site area: RFA Site Prior to Removal:  Level  0 Pressure Applied For: 20 min Manual:   yes Patient Status During Pull:  stable Post Pull Site:  Level 0 Post Pull Instructions Given: yes  Post Pull Pulses Present: palpable Dressing Applied:  clear Bedrest begins @ 1035 Comments:

## 2015-01-23 NOTE — Telephone Encounter (Signed)
Closed encounter °

## 2015-01-23 NOTE — Interval H&P Note (Signed)
Cath Lab Visit (complete for each Cath Lab visit)  Clinical Evaluation Leading to the Procedure:   ACS: No.  Non-ACS:    Anginal Classification: CCS II  Anti-ischemic medical therapy: Minimal Therapy (1 class of medications)  Non-Invasive Test Results: No non-invasive testing performed  Prior CABG: No previous CABG      Cath Lab Visit (complete for each Cath Lab visit)  Clinical Evaluation Leading to the Procedure:   ACS: No.  Non-ACS:    Anginal Classification: CCS II  Anti-ischemic medical therapy: Minimal Therapy (1 class of medications)  Non-Invasive Test Results: No non-invasive testing performed  Prior CABG: No previous CABG      History and Physical Interval Note:  01/23/2015 9:03 AM  Travis Ferguson  has presented today for surgery, with the diagnosis of low EF  The various methods of treatment have been discussed with the patient and family. After consideration of risks, benefits and other options for treatment, the patient has consented to  Procedure(s): Right/Left Heart Cath and Coronary Angiography (N/A) as a surgical intervention .  The patient's history has been reviewed, patient examined, no change in status, stable for surgery.  I have reviewed the patient's chart and labs.  Questions were answered to the patient's satisfaction.     Quay Burow

## 2015-01-24 ENCOUNTER — Encounter (HOSPITAL_COMMUNITY): Payer: Medicare Other

## 2015-01-24 ENCOUNTER — Telehealth (HOSPITAL_COMMUNITY): Payer: Self-pay | Admitting: *Deleted

## 2015-01-26 ENCOUNTER — Encounter (HOSPITAL_COMMUNITY): Payer: Medicare Other

## 2015-01-31 ENCOUNTER — Encounter (HOSPITAL_COMMUNITY): Payer: Medicare Other

## 2015-02-02 ENCOUNTER — Encounter (HOSPITAL_COMMUNITY): Payer: Medicare Other

## 2015-02-06 ENCOUNTER — Telehealth: Payer: Self-pay | Admitting: Cardiovascular Disease

## 2015-02-06 NOTE — Telephone Encounter (Signed)
Spoke with dental assistant at Dr. Trina Ao office. Patient thought his filling fell out but in fact he will need a surgical extraction as the root tip is involved. Patient had heart cath 8/1 - no interventions, medical therapy. Patient is NOT on antiplatelet or anticoagulant therapy and this was communicated to dental assistant. They called in to check on patient's cardiac status as he informed them he had a recent heart cath. No further assistance necessary

## 2015-02-06 NOTE — Telephone Encounter (Signed)
Autumn called in stating that Dr. Jolayne Panther was wanting to do a surgical extraction today but wanted to get Dr. Kennon Holter approval first. Pt is currently in the chair  Thanks

## 2015-02-07 ENCOUNTER — Encounter (HOSPITAL_COMMUNITY): Payer: Medicare Other

## 2015-02-09 ENCOUNTER — Encounter (HOSPITAL_COMMUNITY): Payer: Medicare Other

## 2015-02-14 ENCOUNTER — Encounter (HOSPITAL_COMMUNITY): Payer: Medicare Other

## 2015-02-15 ENCOUNTER — Encounter: Payer: Self-pay | Admitting: Cardiovascular Disease

## 2015-02-15 ENCOUNTER — Ambulatory Visit (INDEPENDENT_AMBULATORY_CARE_PROVIDER_SITE_OTHER): Payer: Medicare Other | Admitting: Cardiovascular Disease

## 2015-02-15 VITALS — BP 128/70 | HR 96 | Ht 71.0 in | Wt 244.1 lb

## 2015-02-15 DIAGNOSIS — I519 Heart disease, unspecified: Secondary | ICD-10-CM | POA: Diagnosis not present

## 2015-02-15 MED ORDER — METOPROLOL TARTRATE 25 MG PO TABS: 25.0000 mg | ORAL_TABLET | Freq: Two times a day (BID) | ORAL | Status: DC

## 2015-02-15 NOTE — Assessment & Plan Note (Signed)
Mr. Godsey recently underwent right and left heart cath because of decrease in EF and abnormal Myoview stress test. 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 He is on low-dose beta blocker, losartan and a diuretic . He has COPD and is on home O2 at night. We've discussed ICD implantation for primary prevention. I'm referring him to Dr. Sallyanne Kuster for discussion and evaluation of this. I'll see him back in 3 months.

## 2015-02-15 NOTE — Progress Notes (Signed)
[name] returns today for post hospital follow-up after his cardiac catheterization which I performed 01/23/15. 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 Diarrhetic. I'm going to increase his metoprolol to twice a day and arrange for him to see Dr. Sallyanne Kuster  to discuss possibility of ICD implantation for primary prevention of sudden cardiac death.  Lorretta Harp, M.D., [place], [facility], [place] [address] [place], [place]  [address]  [phone] 02/15/2015 12:13 PM

## 2015-02-15 NOTE — Patient Instructions (Signed)
Medication Instructions:   Increase Metoprolol to '25mg'$  twice a day  Labwork:  n/a  Testing/Procedures:  n/a  Follow-Up:  You need an appt with Dr Dani Gobble Croitoru to discuss an ICD   Follow up with Dr Gwenlyn Found in 3 months.   Any Other Special Instructions Will Be Listed Below (If Applicable).

## 2015-02-16 ENCOUNTER — Encounter (HOSPITAL_COMMUNITY): Payer: Medicare Other

## 2015-02-17 ENCOUNTER — Ambulatory Visit (INDEPENDENT_AMBULATORY_CARE_PROVIDER_SITE_OTHER): Payer: Medicare Other | Admitting: Cardiovascular Disease

## 2015-02-17 ENCOUNTER — Encounter: Payer: Self-pay | Admitting: Cardiovascular Disease

## 2015-02-17 VITALS — BP 124/80 | HR 87 | Ht 71.0 in | Wt 244.0 lb

## 2015-02-17 DIAGNOSIS — I5041 Acute combined systolic (congestive) and diastolic (congestive) heart failure: Secondary | ICD-10-CM

## 2015-02-17 NOTE — Patient Instructions (Signed)
Your physician has requested that you have an echocardiogram after April 15, 2015. Echocardiography is a painless test that uses sound waves to create images of your heart. It provides your doctor with information about the size and shape of your heart and how well your heart's chambers and valves are working. This procedure takes approximately one hour. There are no restrictions for this procedure.  Your physician recommends that you schedule a follow-up appointment in: as scheduled with Dr. Gwenlyn Found.  You do not need to follow-up with Dr. Sallyanne Kuster

## 2015-02-17 NOTE — Progress Notes (Signed)
Patient ID: Travis Ferguson, male   DOB: June 19, 1941, 74 y.o.   MRN: [id number]      Cardiology Office Note   Date:  02/17/2015   ID:  Travis Ferguson, DOB [birth year], MRN [id number]  PCP:  Jerlyn Ly, MD  Cardiologist:   Sanda Klein, MD   Chief Complaint  Patient presents with  . Follow-up    Patient has swelling at times.      History of Present Illness: Travis Ferguson is a 74 y.o. male who presents for  Discussion of primary prevention defibrillator implantation, referred by Dr. Quay Burow.  He has poorly explained nonischemic cardiomyopathy. His left ventricular ejection fraction was normal by multiple previous tests as recently as February 2016 , but reevaluation on 01/13/2015 shows a left ventricular ejection fraction of 25-30 percent. He underwent cardiac catheterization showing mild nonobstructive coronary atherosclerosis. The patient attributes the declining cardiac function to gallbladder related problems that eventually led to surgery earlier this year. He is on appropriate treatment with beta blockers and angiotensin receptor blockers, recently adjusted by Dr. Gwenlyn Found.  He was taken by surprise when Dr. Gwenlyn Found made recommendation for ICD therapy.  He has thought about it and talked about it with his wife. He does not think he wants a defibrillator. In the sense that this device might be a life-saving intervention , but it will not positive impact his quality of life. His quality of life is primarily determined by shortness of breath related to chronic lung disease but also due to numerous psychiatric problems including depression and posttraumatic stress disorder. 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    Past Medical History  Diagnosis Date  . 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  . Depression   . PTSD (post-traumatic stress disorder)   . Migraine   .  GERD (gastroesophageal reflux disease)   . Dyslipidemia   . Rhinitis   . Cervical disc disease   . Subdural hematoma 11/10    a. 2010 s/p surgery - diagnosed several weeks after a fall.  . Hypertension   . Coronary artery calcification seen on CAT scan   . Sarcoidosis   . Atherosclerosis   . Dyspnea     chronic  . Back pain     chronic back pain/under pain management  . Inguinal hernia     right side  . Colon polyps     adenomatous and hyperplastic  . Sepsis   . Gallstones   . Sensation problem     right side-lateral hip to knee" decreased sensation and tingling feeling" -has informed Dr. Joylene Draft, Dr. Henrene Pastor, Dr. Lucia Gaskins of this.  . Encounter for biliary drainage tube placement     remains with drainage bag to right side for  biliary drainage.  . Sinus congestion     10-27-14 at present some issues-"not bad"  . History of oxygen administration     @ 2 l/m nasally at bedtime.  . Hiatal hernia     sensation problem ("right lateral side hip to knee").  . OSA (obstructive sleep apnea)     does not use CPAP  . Pneumonia     age 49 or 42   . Anxiety   . Frequent urination   . Arthritis   . Chronic bronchitis   . Colon polyps 08/02/2014    Tubular adenoma x 3, Hyperplastic-1  . Diastolic dysfunction  grade 1 diastolic dysfunction on 2-D echo  . Left ventricular dysfunction     ejection fraction of 25-30% with regional wall motion abnormalities.    Past Surgical History  Procedure Laterality Date  . Craniotomy  11/10    right frontal"hemorrhage evacuation from fall injury"  . Video bronchoscopy  07/28/2012    Procedure: VIDEO BRONCHOSCOPY WITH FLUORO;  Surgeon: Chesley Mires, MD;  Location: [facility];  Service: Cardiopulmonary;  Laterality: Bilateral;  . Hernia repair Bilateral     right and left inguinal  . Knee arthroscopy Right   . Lumbar laminectomy/decompression microdiscectomy Right 12/15/2012    Procedure: LUMBAR LAMINECTOMY/DECOMPRESSION MICRODISCECTOMY 1 LEVEL;   Surgeon: Elaina Hoops, MD;  Location: [facility];  Service: Neurosurgery;  Laterality: Right;  Right Lumbar four-five laminectomy/foraminotomy  . Gallbladder surgery      drain tube placed  . Ercp N/A 11/07/2014    Procedure: ENDOSCOPIC RETROGRADE CHOLANGIOPANCREATOGRAPHY (ERCP);  Surgeon: Irene Shipper, MD;  Location: [facility];  Service: Endoscopy;  Laterality: N/A;  . Cholecystectomy N/A 11/08/2014    Procedure: LAPAROSCOPIC CHOLECYSTECTOMY ;  Surgeon: Alphonsa Overall, MD;  Location: [facility];  Service: General;  Laterality: N/A;  . Cardiac catheterization N/A 01/23/2015    Procedure: Right/Left Heart Cath and Coronary Angiography;  Surgeon: Lorretta Harp, MD;  Location: [facility];  Service: Cardiovascular;  Laterality: N/A;     Current Outpatient Prescriptions  Medication Sig Dispense Refill  . budesonide-formoterol (SYMBICORT) 160-4.5 MCG/ACT inhaler Inhale 2 puffs into the lungs 2 (two) times daily. 2 Inhaler 0  . finasteride (PROSCAR) 5 MG tablet Take 5 mg by mouth daily.     [name] FLUoxetine (PROZAC) 40 MG capsule Take 40 mg by mouth daily with breakfast.     . furosemide (LASIX) 20 MG tablet Take 1 tablet (20 mg total) by mouth daily. 30 tablet 4  . HYDROcodone-acetaminophen (NORCO/VICODIN) 5-325 MG per tablet Take 2 tablets by mouth 2 (two) times daily as needed (for pain). (Patient taking differently: Take 2 tablets by mouth 2 (two) times daily. ) 30 tablet 0  . losartan (COZAAR) 50 MG tablet Take 1 tablet (50 mg total) by mouth daily. 30 tablet 4  . metoprolol tartrate (LOPRESSOR) 25 MG tablet Take 1 tablet (25 mg total) by mouth 2 (two) times daily. 90 tablet 3  . Multiple Vitamin (MULTIVITAMIN) tablet Take 1 tablet by mouth daily.    . OXYGEN Inhale 2 L/min into the lungs at bedtime.    [name] PROAIR HFA 108 (90 BASE) MCG/ACT inhaler 1-2 PUFFS EVERY 4-6 HOURS AS NEEDED (Patient taking differently: 1-2 PUFFS EVERY 4-6 HOURS AS NEEDED for shortness of breath) 8.5 Inhaler 1  .  simvastatin (ZOCOR) 20 MG tablet Take 1 tablet by mouth every evening.     . sodium chloride (OCEAN) 0.65 % SOLN nasal spray Place 1-2 sprays into both nostrils daily as needed for congestion.    . traZODone (DESYREL) 100 MG tablet Take 1 tablet (100 mg total) by mouth at bedtime. 15 tablet 0   No current facility-administered medications for this visit.    Allergies:   Statins    Social History:  The patient  reports that he quit smoking about 6 years ago. His smoking use included Pipe. He has never used smokeless tobacco. He reports that he does not drink alcohol or use illicit drugs.   Family History:  The patient's family history includes Colon polyps in his brother; Congestive Heart Failure in his  brother; Coronary artery disease in an other family member; Heart disease in his father and mother; Stomach cancer in his paternal grandmother. There is no history of Colon cancer, Pancreatic cancer, Rectal cancer, or Esophageal cancer.    ROS:  Please see the history of present illness.    Otherwise, review of systems positive for none.   All other systems are reviewed and negative.    PHYSICAL EXAM: VS:  BP 124/80 mmHg  Pulse 87  Ht '5\' 11"'$  (1.803 m)  Wt 244 lb (110.678 kg)  BMI 34.05 kg/m2 , BMI Body mass index is 34.05 kg/(m^2).  General: Alert, oriented x3, no distress Head: no evidence of trauma, PERRL, EOMI, no exophtalmos or lid lag, no myxedema, no xanthelasma; normal ears, nose and oropharynx Neck: normal jugular venous pulsations and no hepatojugular reflux; brisk carotid pulses without delay and no carotid bruits Chest:  Diminished breath sounds throughout, no signs of consolidation by percussion or palpation, normal fremitus, symmetrical and full respiratory excursions Cardiovascular: normal position and quality of the apical impulse, regular rhythm, normal first and second heart sounds, no murmurs, rubs or gallops Abdomen: no tenderness or distention, no masses by  palpation, no abnormal pulsatility or arterial bruits, normal bowel sounds, no hepatosplenomegaly Extremities: no clubbing, cyanosis or edema; 2+ radial, ulnar and brachial pulses bilaterally; 2+ right femoral, posterior tibial and dorsalis pedis pulses; 2+ left femoral, posterior tibial and dorsalis pedis pulses; no subclavian or femoral bruits Neurological: grossly nonfocal Psych: euthymic mood, full affect   EKG:  EKG is not ordered today. The previous ECG demonstrates  Sinus rhythm, narrow complex QRS   Recent Labs: 08/19/2014: B Natriuretic Peptide 460.9* 11/10/2014: Magnesium 1.8 12/27/2014: ALT 10 12/29/2014: Pro B Natriuretic peptide (BNP) 534.0* 01/20/2015: BUN 18; Creat 0.96; Hemoglobin 14.2; Platelets 240; Potassium 4.4; Sodium 137; TSH 2.016    Lipid Panel No results found for: CHOL, TRIG, HDL, CHOLHDL, VLDL, LDLCALC, LDLDIRECT    Wt Readings from Last 3 Encounters:  02/17/15 244 lb (110.678 kg)  02/15/15 244 lb 1.6 oz (110.723 kg)  01/23/15 240 lb (108.863 kg)      Other studies Reviewed: Additional studies/ records that were reviewed today include:  Records from Dr. Gwenlyn Found and Dr. Halford Chessman.   ASSESSMENT AND PLAN:   I think it is premature to decide with the Mr. Gersten needs a defibrillator. Only 2 months have passed since the initial diagnosis of cardiomyopathy, and recent changes in his heart failure medications have been made. It will be very worthwhile to reevaluate his echocardiogram, at least 90 days following the initial diagnosis. Hopefully, his left ventricular dysfunction is a temporary phenomenon.    whether or not his left ventricular systolic function recovers, it is quite apparent that he does not want to have a defibrillator. He is aware of his numerous medical problems and relatively advanced age. It does not sound that he considers sudden cardiac death to be a very negative event. He prefers interventions that'll impact positively on his quality of life.    in  addition I have some reluctance in recommending a defibrillator in view of his psychiatric issues. He does clearly have symptoms suggestive of posttraumatic stress disorder , is prone to anxiety and depression.  Anxiety related to possible defibrillator discharges may worsen his psychiatric condition.   hopefully, repeat evaluation will show improvement in left ventricular systolic function. If left ventricular ejection fraction remains less than 35%, I am more than willing to sit down with him again to discuss  the risks and benefits of defibrillator therapy.  Current medicines are reviewed at length with the patient today.  The patient does not have concerns regarding medicines.  The following changes have been made:  no change  Labs/ tests ordered today include:  Orders Placed This Encounter  Procedures  . ECHOCARDIOGRAM COMPLETE    Patient Instructions  Your physician has requested that you have an echocardiogram after April 15, 2015. Echocardiography is a painless test that uses sound waves to create images of your heart. It provides your doctor with information about the size and shape of your heart and how well your heart's chambers and valves are working. This procedure takes approximately one hour. There are no restrictions for this procedure.  Your physician recommends that you schedule a follow-up appointment in: as scheduled with Dr. Gwenlyn Found.  You do not need to follow-up with Dr. Sinda Du, MD  02/17/2015 5:39 PM    Sanda Klein, MD, [facility] [phone] office [phone] pager

## 2015-02-21 ENCOUNTER — Encounter (HOSPITAL_COMMUNITY): Payer: Medicare Other

## 2015-02-23 ENCOUNTER — Encounter (HOSPITAL_COMMUNITY)
Admission: RE | Admit: 2015-02-23 | Discharge: 2015-02-23 | Disposition: A | Discharge status: Home / Self Care | Payer: Medicare Other | Source: Ambulatory Visit | Attending: Pulmonary Disease | Admitting: Pulmonary Disease

## 2015-02-23 ENCOUNTER — Ambulatory Visit: Payer: Medicare Other | Admitting: Cardiovascular Disease

## 2015-02-23 DIAGNOSIS — J449 Chronic obstructive pulmonary disease, unspecified: Secondary | ICD-10-CM | POA: Insufficient documentation

## 2015-02-23 DIAGNOSIS — D869 Sarcoidosis, unspecified: Secondary | ICD-10-CM | POA: Insufficient documentation

## 2015-02-23 NOTE — Progress Notes (Signed)
Pulmonary Rehab Discharge Note  Travis Ferguson has been discharged from pulmonary rehab due to a change in his health status.  He developed an unexplained sudden decrease in his ejection fraction.  His cardiologist would prefer him to be monitored while exercising which would make cardiac rehab more appropriate.  He has been referred to that program.  He only attended 2 exercise sessions.  He did not meet any goals while with [place].

## 2015-02-24 ENCOUNTER — Emergency Department (HOSPITAL_COMMUNITY): Payer: Medicare Other

## 2015-02-24 ENCOUNTER — Inpatient Hospital Stay (HOSPITAL_COMMUNITY)
Admission: EM | Admit: 2015-02-24 | Discharge: 2015-02-27 | DRG: 291 | Disposition: A | Discharge status: Home / Self Care | Payer: Medicare Other | Attending: Internal Medicine | Admitting: Internal Medicine

## 2015-02-24 ENCOUNTER — Encounter (HOSPITAL_COMMUNITY): Payer: Self-pay | Admitting: Emergency Medicine

## 2015-02-24 DIAGNOSIS — I5021 Acute systolic (congestive) heart failure: Secondary | ICD-10-CM

## 2015-02-24 DIAGNOSIS — J189 Pneumonia, unspecified organism: Secondary | ICD-10-CM | POA: Diagnosis present

## 2015-02-24 DIAGNOSIS — J441 Chronic obstructive pulmonary disease with (acute) exacerbation: Secondary | ICD-10-CM | POA: Diagnosis not present

## 2015-02-24 DIAGNOSIS — D860 Sarcoidosis of lung: Secondary | ICD-10-CM | POA: Diagnosis present

## 2015-02-24 DIAGNOSIS — F329 Major depressive disorder, single episode, unspecified: Secondary | ICD-10-CM | POA: Diagnosis present

## 2015-02-24 DIAGNOSIS — I10 Essential (primary) hypertension: Secondary | ICD-10-CM | POA: Diagnosis present

## 2015-02-24 DIAGNOSIS — G4733 Obstructive sleep apnea (adult) (pediatric): Secondary | ICD-10-CM | POA: Diagnosis present

## 2015-02-24 DIAGNOSIS — Z9981 Dependence on supplemental oxygen: Secondary | ICD-10-CM

## 2015-02-24 DIAGNOSIS — I2510 Atherosclerotic heart disease of native coronary artery without angina pectoris: Secondary | ICD-10-CM | POA: Diagnosis present

## 2015-02-24 DIAGNOSIS — J9621 Acute and chronic respiratory failure with hypoxia: Secondary | ICD-10-CM | POA: Diagnosis present

## 2015-02-24 DIAGNOSIS — R0602 Shortness of breath: Secondary | ICD-10-CM | POA: Diagnosis present

## 2015-02-24 DIAGNOSIS — Z9049 Acquired absence of other specified parts of digestive tract: Secondary | ICD-10-CM | POA: Diagnosis present

## 2015-02-24 DIAGNOSIS — Z8249 Family history of ischemic heart disease and other diseases of the circulatory system: Secondary | ICD-10-CM

## 2015-02-24 DIAGNOSIS — R739 Hyperglycemia, unspecified: Secondary | ICD-10-CM | POA: Diagnosis present

## 2015-02-24 DIAGNOSIS — T380X5A Adverse effect of glucocorticoids and synthetic analogues, initial encounter: Secondary | ICD-10-CM | POA: Diagnosis present

## 2015-02-24 DIAGNOSIS — M549 Dorsalgia, unspecified: Secondary | ICD-10-CM | POA: Diagnosis present

## 2015-02-24 DIAGNOSIS — I5023 Acute on chronic systolic (congestive) heart failure: Secondary | ICD-10-CM | POA: Diagnosis not present

## 2015-02-24 DIAGNOSIS — I428 Other cardiomyopathies: Secondary | ICD-10-CM | POA: Diagnosis present

## 2015-02-24 DIAGNOSIS — J9601 Acute respiratory failure with hypoxia: Secondary | ICD-10-CM | POA: Diagnosis present

## 2015-02-24 DIAGNOSIS — Z79899 Other long term (current) drug therapy: Secondary | ICD-10-CM | POA: Diagnosis not present

## 2015-02-24 DIAGNOSIS — J9600 Acute respiratory failure, unspecified whether with hypoxia or hypercapnia: Secondary | ICD-10-CM

## 2015-02-24 DIAGNOSIS — E872 Acidosis: Secondary | ICD-10-CM | POA: Diagnosis present

## 2015-02-24 DIAGNOSIS — I429 Cardiomyopathy, unspecified: Secondary | ICD-10-CM

## 2015-02-24 DIAGNOSIS — Z87891 Personal history of nicotine dependence: Secondary | ICD-10-CM | POA: Diagnosis not present

## 2015-02-24 DIAGNOSIS — G8929 Other chronic pain: Secondary | ICD-10-CM | POA: Diagnosis present

## 2015-02-24 DIAGNOSIS — E785 Hyperlipidemia, unspecified: Secondary | ICD-10-CM | POA: Diagnosis present

## 2015-02-24 DIAGNOSIS — I5043 Acute on chronic combined systolic (congestive) and diastolic (congestive) heart failure: Secondary | ICD-10-CM | POA: Diagnosis not present

## 2015-02-24 DIAGNOSIS — R0600 Dyspnea, unspecified: Secondary | ICD-10-CM | POA: Diagnosis present

## 2015-02-24 HISTORY — DX: Pneumonia, unspecified organism: J18.9

## 2015-02-24 HISTORY — DX: Adverse effect of glucocorticoids and synthetic analogues, initial encounter: T38.0X5A

## 2015-02-24 HISTORY — DX: Hyperglycemia, unspecified: R73.9

## 2015-02-24 LAB — BLOOD GAS, ARTERIAL
ACID-BASE DEFICIT: 5.2 mmol/L — AB (ref 0.0–2.0)
BICARBONATE: 20.8 meq/L (ref 20.0–24.0)
Delivery systems: POSITIVE
Expiratory PAP: 6
FIO2: 1
Inspiratory PAP: 14
Mode: POSITIVE
O2 SAT: 99.5 %
PH ART: 7.295 — AB (ref 7.350–7.450)
Patient temperature: 98.6
TCO2: 18.7 mmol/L (ref 0–100)
pCO2 arterial: 44 mmHg (ref 35.0–45.0)
pO2, Arterial: 336 mmHg — ABNORMAL HIGH (ref 80.0–100.0)

## 2015-02-24 LAB — COMPREHENSIVE METABOLIC PANEL
ALBUMIN: 3.9 g/dL (ref 3.5–5.0)
ALK PHOS: 65 U/L (ref 38–126)
ALT: 17 U/L (ref 17–63)
ANION GAP: 12 (ref 5–15)
AST: 43 U/L — ABNORMAL HIGH (ref 15–41)
BUN: 18 mg/dL (ref 6–20)
CALCIUM: 9 mg/dL (ref 8.9–10.3)
CHLORIDE: 106 mmol/L (ref 101–111)
CO2: 20 mmol/L — AB (ref 22–32)
Creatinine, Ser: 1.13 mg/dL (ref 0.61–1.24)
GFR calc Af Amer: >60 mL/min (ref >60)
GFR calc non Af Amer: >60 mL/min (ref >60)
Glucose, Bld: 165 mg/dL — ABNORMAL HIGH (ref 65–99)
POTASSIUM: 4.7 mmol/L (ref 3.5–5.1)
Sodium: 138 mmol/L (ref 135–145)
Total Bilirubin: 1.6 mg/dL — ABNORMAL HIGH (ref 0.3–1.2)
Total Protein: 7.1 g/dL (ref 6.5–8.1)

## 2015-02-24 LAB — CBC WITH DIFFERENTIAL/PLATELET
BASOS ABS: 0 10*3/uL (ref 0.0–0.1)
Basophils Relative: 0 % (ref 0–1)
EOS PCT: 3 % (ref 0–5)
Eosinophils Absolute: 0.4 10*3/uL (ref 0.0–0.7)
HCT: 47.4 % (ref 39.0–52.0)
Hemoglobin: 15.9 g/dL (ref 13.0–17.0)
LYMPHS PCT: 31 % (ref 12–46)
Lymphs Abs: 4.5 10*3/uL — ABNORMAL HIGH (ref 0.7–4.0)
MCH: 28.4 pg (ref 26.0–34.0)
MCHC: 33.5 g/dL (ref 30.0–36.0)
MCV: 84.6 fL (ref 78.0–100.0)
Monocytes Absolute: 1.4 10*3/uL — ABNORMAL HIGH (ref 0.1–1.0)
Monocytes Relative: 10 % (ref 3–12)
NEUTROS ABS: 8.2 10*3/uL — AB (ref 1.7–7.7)
Neutrophils Relative %: 56 % (ref 43–77)
PLATELETS: 238 10*3/uL (ref 150–400)
RBC: 5.6 MIL/uL (ref 4.22–5.81)
RDW: 15.6 % — ABNORMAL HIGH (ref 11.5–15.5)
WBC: 14.6 10*3/uL — ABNORMAL HIGH (ref 4.0–10.5)

## 2015-02-24 LAB — STREP PNEUMONIAE URINARY ANTIGEN: Strep Pneumo Urinary Antigen: NEGATIVE

## 2015-02-24 LAB — I-STAT CG4 LACTIC ACID, ED
Lactic Acid, Venous: 4.74 mmol/L (ref 0.5–2.0)
Lactic Acid, Venous: 0.98 mmol/L (ref 0.5–2.0)

## 2015-02-24 LAB — TSH: TSH: 0.773 u[IU]/mL (ref 0.350–4.500)

## 2015-02-24 LAB — MRSA PCR SCREENING: MRSA BY PCR: NEGATIVE

## 2015-02-24 LAB — TROPONIN I: TROPONIN I: 0.03 ng/mL (ref <0.031)

## 2015-02-24 LAB — BRAIN NATRIURETIC PEPTIDE: B Natriuretic Peptide: 424 pg/mL — ABNORMAL HIGH (ref 0.0–100.0)

## 2015-02-24 MED ORDER — OXYCODONE HCL 5 MG PO TABS
5.0000 mg | ORAL_TABLET | ORAL | Status: DC | PRN
  Administered 2015-02-24 – 2015-02-26 (×7): 5 mg via ORAL
  Filled 2015-02-24 (×7): qty 1

## 2015-02-24 MED ORDER — SIMVASTATIN 20 MG PO TABS
20.0000 mg | ORAL_TABLET | Freq: Every evening | ORAL | Status: DC
  Administered 2015-02-24 – 2015-02-26 (×3): 20 mg via ORAL
  Filled 2015-02-24 (×2): qty 2
  Filled 2015-02-24 (×4): qty 1

## 2015-02-24 MED ORDER — HYDROCODONE-ACETAMINOPHEN 5-325 MG PO TABS
1.0000 | ORAL_TABLET | Freq: Once | ORAL | Status: AC
  Administered 2015-02-24: 1 via ORAL
  Filled 2015-02-24: qty 1

## 2015-02-24 MED ORDER — ALBUTEROL SULFATE (2.5 MG/3ML) 0.083% IN NEBU
2.5000 mg | INHALATION_SOLUTION | Freq: Four times a day (QID) | RESPIRATORY_TRACT | Status: DC
  Administered 2015-02-24: 2.5 mg via RESPIRATORY_TRACT
  Filled 2015-02-24: qty 3

## 2015-02-24 MED ORDER — CETYLPYRIDINIUM CHLORIDE 0.05 % MT LIQD
7.0000 mL | Freq: Two times a day (BID) | OROMUCOSAL | Status: DC
  Administered 2015-02-25 – 2015-02-27 (×4): 7 mL via OROMUCOSAL

## 2015-02-24 MED ORDER — DEXTROSE 5 % IV SOLN
1.0000 g | INTRAVENOUS | Status: DC
  Administered 2015-02-25 – 2015-02-27 (×3): 1 g via INTRAVENOUS
  Filled 2015-02-24 (×3): qty 10

## 2015-02-24 MED ORDER — ALBUTEROL SULFATE (2.5 MG/3ML) 0.083% IN NEBU
5.0000 mg | INHALATION_SOLUTION | Freq: Once | RESPIRATORY_TRACT | Status: DC
  Administered 2015-02-24: 5 mg via RESPIRATORY_TRACT
  Filled 2015-02-24: qty 6

## 2015-02-24 MED ORDER — SENNOSIDES-DOCUSATE SODIUM 8.6-50 MG PO TABS: 1.0000 | ORAL_TABLET | Freq: Every evening | ORAL | Status: DC | PRN

## 2015-02-24 MED ORDER — SODIUM CHLORIDE 0.9 % IV SOLN: 250.0000 mL | INTRAVENOUS | Status: DC | PRN

## 2015-02-24 MED ORDER — ENOXAPARIN SODIUM 60 MG/0.6ML ~~LOC~~ SOLN
55.0000 mg | SUBCUTANEOUS | Status: DC
  Administered 2015-02-24 – 2015-02-26 (×3): 55 mg via SUBCUTANEOUS
  Filled 2015-02-24 (×5): qty 0.6

## 2015-02-24 MED ORDER — FUROSEMIDE 10 MG/ML IJ SOLN
40.0000 mg | Freq: Two times a day (BID) | INTRAMUSCULAR | Status: DC
  Administered 2015-02-25 – 2015-02-26 (×3): 40 mg via INTRAVENOUS
  Filled 2015-02-24 (×3): qty 4

## 2015-02-24 MED ORDER — ALBUTEROL SULFATE (2.5 MG/3ML) 0.083% IN NEBU: 2.5000 mg | INHALATION_SOLUTION | RESPIRATORY_TRACT | Status: DC | PRN

## 2015-02-24 MED ORDER — FLUOXETINE HCL 20 MG PO CAPS
40.0000 mg | ORAL_CAPSULE | Freq: Every day | ORAL | Status: DC
  Administered 2015-02-25 – 2015-02-27 (×3): 40 mg via ORAL
  Filled 2015-02-24 (×3): qty 2

## 2015-02-24 MED ORDER — AZITHROMYCIN 500 MG PO TABS
500.0000 mg | ORAL_TABLET | ORAL | Status: DC
  Administered 2015-02-25 – 2015-02-27 (×3): 500 mg via ORAL
  Filled 2015-02-24: qty 2
  Filled 2015-02-24 (×2): qty 1

## 2015-02-24 MED ORDER — SODIUM CHLORIDE 0.9 % IJ SOLN
3.0000 mL | Freq: Two times a day (BID) | INTRAMUSCULAR | Status: DC
  Administered 2015-02-24 – 2015-02-27 (×6): 3 mL via INTRAVENOUS

## 2015-02-24 MED ORDER — DEXTROSE 5 % IV SOLN
500.0000 mg | INTRAVENOUS | Status: DC
  Administered 2015-02-24: 500 mg via INTRAVENOUS
  Filled 2015-02-24: qty 500

## 2015-02-24 MED ORDER — ALBUTEROL SULFATE (2.5 MG/3ML) 0.083% IN NEBU
INHALATION_SOLUTION | RESPIRATORY_TRACT | Status: AC
  Administered 2015-02-24: 5 mg via RESPIRATORY_TRACT
  Filled 2015-02-24: qty 3

## 2015-02-24 MED ORDER — TRAZODONE HCL 100 MG PO TABS
100.0000 mg | ORAL_TABLET | Freq: Every day | ORAL | Status: DC
  Administered 2015-02-24 – 2015-02-26 (×3): 100 mg via ORAL
  Filled 2015-02-24 (×2): qty 1
  Filled 2015-02-24: qty 2

## 2015-02-24 MED ORDER — DEXTROSE 5 % IV SOLN
1.0000 g | INTRAVENOUS | Status: DC
  Administered 2015-02-24: 1 g via INTRAVENOUS
  Filled 2015-02-24: qty 10

## 2015-02-24 MED ORDER — SODIUM CHLORIDE 0.9 % IV SOLN
INTRAVENOUS | Status: DC
  Administered 2015-02-24: 12:00:00 via INTRAVENOUS

## 2015-02-24 MED ORDER — BUDESONIDE-FORMOTEROL FUMARATE 160-4.5 MCG/ACT IN AERO
2.0000 | INHALATION_SPRAY | Freq: Two times a day (BID) | RESPIRATORY_TRACT | Status: DC
  Administered 2015-02-24 – 2015-02-27 (×5): 2 via RESPIRATORY_TRACT
  Filled 2015-02-24 (×2): qty 6

## 2015-02-24 MED ORDER — IPRATROPIUM-ALBUTEROL 0.5-2.5 (3) MG/3ML IN SOLN
3.0000 mL | Freq: Four times a day (QID) | RESPIRATORY_TRACT | Status: DC
  Administered 2015-02-24 – 2015-02-25 (×2): 3 mL via RESPIRATORY_TRACT
  Filled 2015-02-24 (×2): qty 3

## 2015-02-24 MED ORDER — ACETAMINOPHEN 325 MG PO TABS: 650.0000 mg | ORAL_TABLET | Freq: Four times a day (QID) | ORAL | Status: DC | PRN

## 2015-02-24 MED ORDER — IPRATROPIUM BROMIDE 0.02 % IN SOLN
0.5000 mg | Freq: Four times a day (QID) | RESPIRATORY_TRACT | Status: DC
  Administered 2015-02-24: 0.5 mg via RESPIRATORY_TRACT
  Filled 2015-02-24: qty 2.5

## 2015-02-24 MED ORDER — ONDANSETRON HCL 4 MG PO TABS: 4.0000 mg | ORAL_TABLET | Freq: Four times a day (QID) | ORAL | Status: DC | PRN

## 2015-02-24 MED ORDER — SODIUM CHLORIDE 0.9 % IJ SOLN
3.0000 mL | INTRAMUSCULAR | Status: DC | PRN
  Administered 2015-02-24: 3 mL via INTRAVENOUS
  Filled 2015-02-24: qty 3

## 2015-02-24 MED ORDER — ACETAMINOPHEN 650 MG RE SUPP: 650.0000 mg | Freq: Four times a day (QID) | RECTAL | Status: DC | PRN

## 2015-02-24 MED ORDER — FINASTERIDE 5 MG PO TABS
5.0000 mg | ORAL_TABLET | Freq: Every day | ORAL | Status: DC
  Administered 2015-02-24 – 2015-02-27 (×4): 5 mg via ORAL
  Filled 2015-02-24 (×4): qty 1

## 2015-02-24 MED ORDER — FUROSEMIDE 10 MG/ML IJ SOLN
60.0000 mg | Freq: Once | INTRAMUSCULAR | Status: AC
  Administered 2015-02-24: 60 mg via INTRAVENOUS
  Filled 2015-02-24: qty 8

## 2015-02-24 MED ORDER — FUROSEMIDE 10 MG/ML IJ SOLN: 40.0000 mg | Freq: Two times a day (BID) | INTRAMUSCULAR | Status: DC

## 2015-02-24 MED ORDER — METHYLPREDNISOLONE SODIUM SUCC 125 MG IJ SOLR
60.0000 mg | Freq: Three times a day (TID) | INTRAMUSCULAR | Status: DC
  Administered 2015-02-24 – 2015-02-26 (×6): 60 mg via INTRAVENOUS
  Filled 2015-02-24: qty 0.96
  Filled 2015-02-24: qty 2
  Filled 2015-02-24 (×2): qty 0.96
  Filled 2015-02-24: qty 2
  Filled 2015-02-24: qty 0.96
  Filled 2015-02-24: qty 2

## 2015-02-24 MED ORDER — ONDANSETRON HCL 4 MG/2ML IJ SOLN: 4.0000 mg | Freq: Four times a day (QID) | INTRAMUSCULAR | Status: DC | PRN

## 2015-02-24 MED ORDER — METOPROLOL TARTRATE 25 MG PO TABS
25.0000 mg | ORAL_TABLET | Freq: Two times a day (BID) | ORAL | Status: DC
  Administered 2015-02-24 – 2015-02-27 (×7): 25 mg via ORAL
  Filled 2015-02-24 (×7): qty 1

## 2015-02-24 MED ORDER — LOSARTAN POTASSIUM 50 MG PO TABS
50.0000 mg | ORAL_TABLET | Freq: Every day | ORAL | Status: DC
  Administered 2015-02-25 – 2015-02-27 (×3): 50 mg via ORAL
  Filled 2015-02-24 (×3): qty 1

## 2015-02-24 MED ORDER — ADULT MULTIVITAMIN W/MINERALS CH
1.0000 | ORAL_TABLET | Freq: Every day | ORAL | Status: DC
  Administered 2015-02-25 – 2015-02-27 (×3): 1 via ORAL
  Filled 2015-02-24 (×3): qty 1

## 2015-02-24 MED ORDER — NITROGLYCERIN IN D5W 200-5 MCG/ML-% IV SOLN
0.0000 ug/min | Freq: Once | INTRAVENOUS | Status: DC
Start: 2015-02-24 — End: 2015-02-24
  Filled 2015-02-24: qty 250

## 2015-02-24 NOTE — ED Notes (Signed)
Results given to Dr Zenia Resides

## 2015-02-24 NOTE — ED Notes (Signed)
EKG given to EDP,Allen,MD., for review. 

## 2015-02-24 NOTE — ED Provider Notes (Signed)
CSN: [id number]     Arrival date & time 02/24/15  0654 History   First MD Initiated Contact with Patient 02/24/15 [phone]     Chief Complaint  Patient presents with  . Respiratory Distress     (Consider location/radiation/quality/duration/timing/severity/associated sxs/prior Treatment) HPI Comments: Pt here with acute onset of sob and chest pressure--has had these sx for he past 3 mornings--h/o chf/copd and endorses non-productive cough--no fever or chills, denies vomiting/diarrhea States compliant with his meds Sx have been progressively worse, no tx used pta  The history is provided by the patient.    Past Medical History  Diagnosis Date  . 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  . Depression   . PTSD (post-traumatic stress disorder)   . Migraine   . GERD (gastroesophageal reflux disease)   . Dyslipidemia   . Rhinitis   . Cervical disc disease   . Subdural hematoma 11/10    a. 2010 s/p surgery - diagnosed several weeks after a fall.  . Hypertension   . Coronary artery calcification seen on CAT scan   . Sarcoidosis   . Atherosclerosis   . Dyspnea     chronic  . Back pain     chronic back pain/under pain management  . Inguinal hernia     right side  . Colon polyps     adenomatous and hyperplastic  . Sepsis   . Gallstones   . Sensation problem     right side-lateral hip to knee" decreased sensation and tingling feeling" -has informed Dr. Joylene Draft, Dr. Henrene Pastor, Dr. Lucia Gaskins of this.  . Encounter for biliary drainage tube placement     remains with drainage bag to right side for  biliary drainage.  . Sinus congestion     10-27-14 at present some issues-"not bad"  . History of oxygen administration     @ 2 l/m nasally at bedtime.  . Hiatal hernia     sensation problem ("right lateral side hip to knee").  . OSA (obstructive sleep apnea)     does not use CPAP  . Pneumonia     age [age]   . Anxiety   . Frequent urination    . Arthritis   . Chronic bronchitis   . Colon polyps 08/02/2014    Tubular adenoma x 3, Hyperplastic-1  . Diastolic dysfunction     grade 1 diastolic dysfunction on 2-D echo  . Left ventricular dysfunction     ejection fraction of 25-30% with regional wall motion abnormalities.   Past Surgical History  Procedure Laterality Date  . Craniotomy  11/10    right frontal"hemorrhage evacuation from fall injury"  . Video bronchoscopy  07/28/2012    Procedure: VIDEO BRONCHOSCOPY WITH FLUORO;  Surgeon: Chesley Mires, MD;  Location: [facility];  Service: Cardiopulmonary;  Laterality: Bilateral;  . Hernia repair Bilateral     right and left inguinal  . Knee arthroscopy Right   . Lumbar laminectomy/decompression microdiscectomy Right 12/15/2012    Procedure: LUMBAR LAMINECTOMY/DECOMPRESSION MICRODISCECTOMY 1 LEVEL;  Surgeon: Elaina Hoops, MD;  Location: [facility];  Service: Neurosurgery;  Laterality: Right;  Right Lumbar four-five laminectomy/foraminotomy  . Gallbladder surgery      drain tube placed  . Ercp N/A 11/07/2014    Procedure: ENDOSCOPIC RETROGRADE CHOLANGIOPANCREATOGRAPHY (ERCP);  Surgeon: Irene Shipper, MD;  Location: [facility];  Service: Endoscopy;  Laterality: N/A;  . Cholecystectomy N/A 11/08/2014    Procedure: LAPAROSCOPIC  CHOLECYSTECTOMY ;  Surgeon: Alphonsa Overall, MD;  Location: [facility];  Service: General;  Laterality: N/A;  . Cardiac catheterization N/A 01/23/2015    Procedure: Right/Left Heart Cath and Coronary Angiography;  Surgeon: Lorretta Harp, MD;  Location: [facility];  Service: Cardiovascular;  Laterality: N/A;   Family History  Problem Relation Age of Onset  . Coronary artery disease    . Colon cancer Neg Hx   . Pancreatic cancer Neg Hx   . Rectal cancer Neg Hx   . Esophageal cancer Neg Hx   . Stomach cancer Paternal Grandmother   . Heart disease Mother   . Heart disease Father   . Colon polyps Brother   . Congestive Heart Failure Brother    Social  History  Substance Use Topics  . Smoking status: Former Smoker -- 0.00 packs/day for 30 years    Types: Pipe    Quit date: 06/24/2008  . Smokeless tobacco: Never Used  . Alcohol Use: No    Review of Systems  All other systems reviewed and are negative.     Allergies  Statins  Home Medications   Prior to Admission medications   Medication Sig Start Date End Date Taking? Authorizing Provider  budesonide-formoterol (SYMBICORT) 160-4.5 MCG/ACT inhaler Inhale 2 puffs into the lungs 2 (two) times daily. 05/11/12   Tammy S Parrett, NP  finasteride (PROSCAR) 5 MG tablet Take 5 mg by mouth daily.  01/07/13   Historical Provider, MD  FLUoxetine (PROZAC) 40 MG capsule Take 40 mg by mouth daily with breakfast.     Historical Provider, MD  furosemide (LASIX) 20 MG tablet Take 1 tablet (20 mg total) by mouth daily. 12/29/14   Noralee Space, MD  HYDROcodone-acetaminophen (NORCO/VICODIN) 5-325 MG per tablet Take 2 tablets by mouth 2 (two) times daily as needed (for pain). Patient taking differently: Take 2 tablets by mouth 2 (two) times daily.  08/25/14   Nita Sells, MD  losartan (COZAAR) 50 MG tablet Take 1 tablet (50 mg total) by mouth daily. 12/29/14   Noralee Space, MD  metoprolol tartrate (LOPRESSOR) 25 MG tablet Take 1 tablet (25 mg total) by mouth 2 (two) times daily. 02/15/15   Lorretta Harp, MD  Multiple Vitamin (MULTIVITAMIN) tablet Take 1 tablet by mouth daily.    Historical Provider, MD  OXYGEN Inhale 2 L/min into the lungs at bedtime.    Historical Provider, MD  PROAIR HFA 108 (90 BASE) MCG/ACT inhaler 1-2 PUFFS EVERY 4-6 HOURS AS NEEDED Patient taking differently: 1-2 PUFFS EVERY 4-6 HOURS AS NEEDED for shortness of breath 11/15/14   Chesley Mires, MD  simvastatin (ZOCOR) 20 MG tablet Take 1 tablet by mouth every evening.  04/10/14   Historical Provider, MD  sodium chloride (OCEAN) 0.65 % SOLN nasal spray Place 1-2 sprays into both nostrils daily as needed for congestion.     Historical Provider, MD  traZODone (DESYREL) 100 MG tablet Take 1 tablet (100 mg total) by mouth at bedtime. 08/25/14   Nita Sells, MD   BP 188/136 mmHg  Pulse 133  Resp 36  SpO2 74% Physical Exam  Constitutional: He is oriented to person, place, and time. He appears toxic. He appears distressed.  HENT:  Head: Normocephalic and atraumatic.  Eyes: Conjunctivae, EOM and lids are normal. Pupils are equal, round, and reactive to light.  Neck: Normal range of motion. Neck supple. No tracheal deviation present. No thyroid mass present.  Cardiovascular: Regular rhythm and normal heart sounds.  Tachycardia present.  Exam reveals no gallop.   No murmur heard. Pulmonary/Chest: No stridor. He is in respiratory distress. He has decreased breath sounds. He has no wheezes. He has rhonchi. He has no rales.  Abdominal: Soft. Normal appearance and bowel sounds are normal. He exhibits no distension. There is no tenderness. There is no rebound and no CVA tenderness.  Musculoskeletal: Normal range of motion. He exhibits no edema or tenderness.  Neurological: He is alert and oriented to person, place, and time. He has normal strength. No cranial nerve deficit or sensory deficit. GCS eye subscore is 4. GCS verbal subscore is 5. GCS motor subscore is 6.  Skin: Skin is warm and dry. No abrasion and no rash noted.  Psychiatric: He has a normal mood and affect. His speech is normal and behavior is normal.  Nursing note and vitals reviewed.   ED Course  Procedures (including critical care time) Labs Review Labs Reviewed  BRAIN NATRIURETIC PEPTIDE  TROPONIN I  CBC WITH DIFFERENTIAL/PLATELET  COMPREHENSIVE METABOLIC PANEL  BLOOD GAS, ARTERIAL  I-STAT CG4 LACTIC ACID, ED    Imaging Review No results found. I have personally reviewed and evaluated these images and lab results as part of my medical decision-making.   EKG Interpretation   Date/Time:  Friday February 24 2015 07:08:25  EDT Ventricular Rate:  118 PR Interval:  146 QRS Duration: 105 QT Interval:  335 QTC Calculation: 469 R Axis:   45 Text Interpretation:  Sinus tachycardia Paired ventricular premature  complexes Borderline repol abnrm, inferolateral leads No significant  change since last tracing Confirmed by Yitta Gongaware  MD, Kinser Fellman ([id number]) on  02/24/2015 7:20:32 AM      MDM   Final diagnoses:  None    Patient placed on BiPAP on arrival. Lasix was ordered IV push for CHF.pt started on abx for cap. Patient had a blood gas performed and showed an adequate PO2. BiPAP adjustments given. Patient reassessed multiple times in breathing with currently at this time. Cardiology consult obtained and requested patient be admitted to hospitalist service. Spoke with hospitalist and they will admit the patient  CRITICAL CARE Performed by: Leota Jacobsen Total critical care time: 60 Critical care time was exclusive of separately billable procedures and treating other patients. Critical care was necessary to treat or prevent imminent or life-threatening deterioration. Critical care was time spent personally by me on the following activities: development of treatment plan with patient and/or surrogate as well as nursing, discussions with consultants, evaluation of patient's response to treatment, examination of patient, obtaining history from patient or surrogate, ordering and performing treatments and interventions, ordering and review of laboratory studies, ordering and review of radiographic studies, pulse oximetry and re-evaluation of patient's condition.     Lacretia Leigh, MD 02/24/15 [phone]

## 2015-02-24 NOTE — Progress Notes (Signed)
Patient has been unable to produce a sputum sample.

## 2015-02-24 NOTE — ED Notes (Signed)
Pt. On monitor. 

## 2015-02-24 NOTE — ED Notes (Signed)
Pt presents with resp distress upon arrival to the emergency room  Pt states he woke up this morning having difficulty breathing  Pt brought back to room 23  Dr Zenia Resides in room to see pt  Pt diaphoretic and pale

## 2015-02-24 NOTE — Consult Note (Signed)
CARDIOLOGY CONSULT NOTE   Patient ID: Travis Ferguson MRN: [id number], DOB/AGE: [id number]   Admit date: 02/24/2015 Date of Consult: 02/24/2015   Primary Physician: Jerlyn Ly, MD Primary Cardiologist: Dr. Gwenlyn Found   Pt. 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 Cardiology consulted for concern of acute on chronic systolic HF.   Problem List  Past Medical History  Diagnosis Date  . 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  . Depression   . PTSD (post-traumatic stress disorder)   . Migraine   . GERD (gastroesophageal reflux disease)   . Dyslipidemia   . Rhinitis   . Cervical disc disease   . Subdural hematoma 11/10    a. 2010 s/p surgery - diagnosed several weeks after a fall.  . Hypertension   . Coronary artery calcification seen on CAT scan   . Sarcoidosis   . Atherosclerosis   . Dyspnea     chronic  . Back pain     chronic back pain/under pain management  . Inguinal hernia     right side  . Colon polyps     adenomatous and hyperplastic  . Sepsis   . Gallstones   . Sensation problem     right side-lateral hip to knee" decreased sensation and tingling feeling" -has informed Dr. Joylene Draft, Dr. Henrene Pastor, Dr. Lucia Gaskins of this.  . Encounter for biliary drainage tube placement     remains with drainage bag to right side for  biliary drainage.  . Sinus congestion     10-27-14 at present some issues-"not bad"  . History of oxygen administration     @ 2 l/m nasally at bedtime.  . Hiatal hernia     sensation problem ("right lateral side hip to knee").  . OSA (obstructive sleep apnea)     does not use CPAP  . Pneumonia     age 74 or 74   . Anxiety   . Frequent urination   . Arthritis   . Chronic bronchitis   . Colon polyps  08/02/2014    Tubular adenoma x 3, Hyperplastic-1  . Diastolic dysfunction     grade 1 diastolic dysfunction on 2-D echo  . Left ventricular dysfunction     ejection fraction of 25-30% with regional wall motion abnormalities.    Past Surgical History  Procedure Laterality Date  . Craniotomy  11/10    right frontal"hemorrhage evacuation from fall injury"  . Video bronchoscopy  07/28/2012    Procedure: VIDEO BRONCHOSCOPY WITH FLUORO;  Surgeon: Chesley Mires, MD;  Location: [facility];  Service: Cardiopulmonary;  Laterality: Bilateral;  . Hernia repair Bilateral     right and left inguinal  . Knee arthroscopy Right   . Lumbar laminectomy/decompression microdiscectomy Right 12/15/2012    Procedure: LUMBAR LAMINECTOMY/DECOMPRESSION MICRODISCECTOMY 1 LEVEL;  Surgeon: Elaina Hoops, MD;  Location: [facility];  Service: Neurosurgery;  Laterality: Right;  Right Lumbar four-five laminectomy/foraminotomy  . Gallbladder surgery      drain tube placed  . Ercp N/A 11/07/2014    Procedure: ENDOSCOPIC RETROGRADE CHOLANGIOPANCREATOGRAPHY (ERCP);  Surgeon: Irene Shipper, MD;  Location: [facility];  Service: Endoscopy;  Laterality: N/A;  . Cholecystectomy N/A 11/08/2014    Procedure: LAPAROSCOPIC CHOLECYSTECTOMY ;  Surgeon: Alphonsa Overall, MD;  Location: [facility];  Service: General;  Laterality: N/A;  . Cardiac catheterization N/A 01/23/2015    Procedure: Right/Left Heart Cath and Coronary Angiography;  Surgeon: Lorretta Harp, MD;  Location: [facility];  Service: Cardiovascular;  Laterality: N/A;     Allergies  Allergies  Allergen Reactions  . 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 Patient went through laparoscopic cholecystectomy earlier this year.  His last normal echo was on 08/19/2014 which showed EF 95-63%, grade 1 diastolic dysfunction, mild MR with frequent PVCs throughout the study. Unfortunately, on repeat echo on 01/13/2015, study showed EF 25-30%, diffuse hypokinesis, akinesis of the entire inferolateral and inferior myocardium, akinesis of mid anteroseptal myocardium, grade 1 diastolic dysfunction, mild MR, moderately dilated RV, increased thickness of atrial septum consistent with lipomatous hypertrophy. He was seen by Dr. Gwenlyn Found and recommended a left and right heart cath. He underwent a scheduled cath on 01/23/2015. Left heart cath showed 40% ostial to proximal RCA lesion, 40% ostial to proximal LAD lesion, 40% ost LM dx, EF 25%. Right heart cath showed cardiac output 6.04, cardiac index 2.65, RV pressure 43/13, PA pressure 49/8, mean wedge pressure 25, LVEDP 24. His metoprolol dose was recently increased. Dr. Gwenlyn Found also referred him to Dr. Sallyanne Kuster to discuss ICD placement, however patient did not want a defibrillator, he understood that if this device might be lifesaving being intervention, however it will not positively impact his quality of life. Per [place], it is premature to decide on discharge of his defibrillator she has a spinning only 2 months since initial diagnosis of cardiomyopathy, he recommended reevaluate his echocardiogram in 3 month, however whether or not his EF improved, it is apparent patient did not want a defibrillator.   For the last week and half, patient has been having increased thick yellow sputum productive cough that occurs more in the morning. He denies any obvious fever or chill. He has been compliant with his diuretic medication. For the last 3 days, his symptom worsened. He complained of abdominal and chest congestion, however no obvious chest pain. He denies any recent lower extremity edema, orthopnea or paroxysmal nocturnal dyspnea. This morning, he states he was already awake when he went into acute respiratory  distress. He try to use his rescue inhaler at home without improvement. He sought medical attention [facility]. Due to his extensive cardiac history, cardiology was consulted. Patient is currently placed on BiPAP due to acute respiratory distress. His lactic acid is 4.74. BNP 424. Troponin negative. Creatinine 1.13. White blood cell count 14.6. Chest x-ray showed interval development of patchy airspace consolidation with central and lower lobe prominence, appears most suggestive of developing pulmonary edema, however multifocal airspace consolidation may have a similar appearance. EKG showed sinus tachycardia without significant ST-T wave changes. Given chest x-ray result, patient was given one dose 60 mg IV Lasix in the ED.   Inpatient Medications  . nitroGLYCERIN  0-200 mcg/min Intravenous Once    Family History Family History  Problem Relation Age of Onset  . Coronary artery disease    . Colon cancer Neg Hx   . Pancreatic cancer Neg Hx   . Rectal cancer Neg Hx   . Esophageal cancer Neg Hx   . Stomach cancer Paternal Grandmother   .  Heart disease Mother   . Heart disease Father   . Colon polyps Brother   . Congestive Heart Failure Brother      Social History Social History   Social History  . Marital Status: Married    Spouse Name: N/A  . Number of Children: N/A  . Years of Education: N/A   Occupational History  . retired Airline pilot in [place]  . Smoking status: Former Smoker -- 0.00 packs/day for 30 years    Types: Pipe    Quit date: 06/24/2008  . Smokeless tobacco: Never Used  . Alcohol Use: No  . Drug Use: No  . Sexual Activity: Not on file   Other Topics Concern  . Not on file   Social History Narrative     Review of Systems  General:  No chills, fever, night sweats or weight changes.  Cardiovascular:  No chest pain, dyspnea on exertion, edema, orthopnea, palpitations, paroxysmal nocturnal dyspnea. Dermatological: No rash,  lesions/masses Respiratory: +cough, dyspnea Urologic: No hematuria, dysuria Abdominal:   No nausea, vomiting, diarrhea, bright red blood per rectum, melena, or hematemesis Neurologic:  No visual changes, wkns, changes in mental status. All other systems reviewed and are otherwise negative except as noted above.  Physical Exam  Blood pressure 102/71, pulse 93, resp. rate 21, SpO2 99 %.  General: Pleasant, NAD Psych: Normal affect. Neuro: Alert and oriented X 3. Moves all extremities spontaneously. HEENT: Normal  Neck: Supple without bruits or JVD. Lungs:  Resp regular and unlabored. Bilateral wheezing and rhonchi Heart: RRR no s3, s4, or murmurs. Abdomen: Soft, non-tender, BS + x 4. +mildly distended.  Extremities: No clubbing, cyanosis. DP/PT/Radials 2+ and equal bilaterally. 1+ pitting edema  Labs   Recent Labs  02/24/15 0717  TROPONINI <0.03   Lab Results  Component Value Date   WBC 14.6* 02/24/2015   HGB 15.9 02/24/2015   HCT 47.4 02/24/2015   MCV 84.6 02/24/2015   PLT 238 02/24/2015    Recent Labs Lab 02/24/15 0717  NA 138  K 4.7  CL 106  CO2 20*  BUN 18  CREATININE 1.13  CALCIUM 9.0  PROT 7.1  BILITOT 1.6*  ALKPHOS 65  ALT 17  AST 43*  GLUCOSE 165*    Radiology/Studies  Dg Chest Port 1 View  02/24/2015   CLINICAL DATA:  Extreme shortness of breath this morning.  EXAM: PORTABLE CHEST - 1 VIEW  COMPARISON:  01/17/2015  FINDINGS: Cardiomediastinal silhouette is stably enlarged. Mediastinal contours appear intact.  There is no evidence of pneumothorax. There are bilateral patchy airspace consolidations with central and lower lobe predominance. Left hemidiaphragm is obscured which may be due to subpulmonic effusion with associated atelectasis, or more confluent airspace consolidation.  Osseous structures are without acute abnormality. Soft tissues are grossly normal.  IMPRESSION: Interval development of patchy airspace consolidations with central and lower lobe  predominance.  Possible left subpulmonic effusion with associated atelectasis versus more confluent left lower lobe airspace consolidation.  The appearance is most suggestive of developing pulmonary edema, however multifocal airspace consolidation may have a similar appearance.   Electronically Signed   By: Fidela Salisbury M.D.   On: 02/24/2015 07:32    ECG  Sinus tachycardia with no significant ST-T wave changes. No change when compared to the previous EKG.  ASSESSMENT AND PLAN  1. Acute respiratory failure likely combination of COPD exacerbation and HF  - he was given a dose of IV lasix a hour  before my examination with moderate UOP. However no obvious rale heard on exam but does have wheezing and rhonchi, his abdomen is only slightly distended. He does have 1+ pitting edema in the leg. I repeatedly asked him if he had PND this morning or past few days, he denied it. He emphasized he has been coughing for 1.5 weeks  - his SOB seems out of proportion for the degree of HF, agree that he might have a little fluid on board, but did not expect his O2 sat and ABI to look like they were, concerned about superimposed COPD exacerbation  - IM to admit, will see how he respond to this dose of IV lasix first. Elevated lactic acid noted, 4.74. WBC 14.6. Afebrile so far.    2. Acute on chronic systolic HF:   - did not appreciate rale on exam a hr after given IV lasix with moderate UOP, sx out of proportion to the degree of HF  - will monitor how he respond to current dose of lasix first.   - he's borderline hypotensive at this time with SBP in high 90s, will hold BB and ACEI/ARB  3. Possible COPD exacerbation: per IM  4. NICM: recent Left and Right heart cath 01/23/2015 (see below)  5. nonobstructive CAD  - no angina symptom per pt, only has significant SOB and chest/abdomen congestion.  - recent cath a month ago reassuring.   - L&RHC 01/23/2015 Left heart cath showed 40% ostial to proximal RCA lesion,  40% ostial to proximal LAD lesion, 40% ost LM dx, EF 25%. Right heart cath showed cardiac output 6.04, cardiac index 2.65, RV pressure 43/13, PA pressure 49/8, mean wedge pressure 25, LVEDP 24.  6. HTN 7. HLD 8. pulmonary sarcoidosis 9. depression/PTSD 10. OSA not on CPAP  Signed, Almyra Deforest, [place] 02/24/2015, 8:52 AM  Patient seen in the emergency room.  Agree with above assessment and plan.  He has had a bad cough with yellow sputum for about a week.  Over the past several days he has noted that his oxygen levels at home have been decreasing.  He has not been aware of any previous peripheral edema.  He does have a history of nonischemic cardiomyopathy.  He has had a good initial response to a dose of IV Lasix given in the emergency room.  His breathing has improved and he has been transitioned to nasal oxygen in the emergency room.  He denies any chest discomfort but has had some sensation of fullness and pressure in his upper abdomen particularly when he first gets up in the morning.  His blood pressure is soft.  Lungs reveal no inspiratory rales but he does have some expiratory wheezes bilaterally.  The heart reveals no gallop.  The abdomen is nontender and extremities now show no peripheral edema after diuresis earlier today. I believe that his predominant symptoms are pulmonary and related to a respiratory infection with purulent sputum.  He may also be having an exacerbation of his chronic systolic heart failure.  He has been on furosemide 20 mg daily at home.  He may benefit from a larger dose at discharge.

## 2015-02-24 NOTE — H&P (Signed)
[facility]          History and Physical    PCP:   Jerlyn Ly, MD   EDP: Vivi Martens, M.D.  Chief Complaint:  Shortness of breath  HPI: Patient is a 74 year old man with multiple medical comorbidities including COPD on chronic oxygen 2 L, hypertension, hyperlipidemia, nonobstructive coronary artery disease, obstructive sleep apnea not on C Pap, nonischemic cardiomyopathy with an ejection fraction of 25-30% who presents to the hospital today with shortness of breath. Patient states that for the past 3 days he has woken up in the morning with severe shortness of breath. For about a week he has been having a cough with purulent sputum production. He denies orthopnea or PND. Has not had fevers or chills. On arrival to the emergency department he was found to be hemodynamically stable, increased work of breathing to the point where he was started on a BiPAP, lactic acid was 4.74. Chest x-ray showed patchy airspace consolidations with central and lower lobe predominance. A possible left subpulmonic effusion with associated atelectasis versus more confluent left lower lobe airspace consolidation. The appearance is most suggestive of developing pulmonary edema, however multifocal airspace consolidation may have a similar appearance. An ABG showed acidosis and hypoxemia with a pH of 7.29, PCO2 44 and PO2 of 336, this was done already on BiPAP. Has already been assessed in the emergency department by cardiology who believes that most of his findings of pulmonary. We have been asked to admit him for further evaluation and management.  Allergies:   Allergies  Allergen Reactions  . Statins Other (See Comments)    Muscle aches - tolerating Vytorin      Past Medical History  Diagnosis Date  . 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  . Depression   . PTSD (post-traumatic stress disorder)   . Migraine   . GERD  (gastroesophageal reflux disease)   . Dyslipidemia   . Rhinitis   . Cervical disc disease   . Subdural hematoma 11/10    a. 2010 s/p surgery - diagnosed several weeks after a fall.  . Hypertension   . Coronary artery calcification seen on CAT scan   . Sarcoidosis   . Atherosclerosis   . Dyspnea     chronic  . Back pain     chronic back pain/under pain management  . Inguinal hernia     right side  . Colon polyps     adenomatous and hyperplastic  . Sepsis   . Gallstones   . Sensation problem     right side-lateral hip to knee" decreased sensation and tingling feeling" -has informed Dr. Joylene Draft, Dr. Henrene Pastor, Dr. Lucia Gaskins of this.  . Encounter for biliary drainage tube placement     remains with drainage bag to right side for  biliary drainage.  . Sinus congestion     10-27-14 at present some issues-"not bad"  . History of oxygen administration     @ 2 l/m nasally at bedtime.  . Hiatal hernia     sensation problem ("right lateral side hip to knee").  . OSA (obstructive sleep apnea)     does not use CPAP  . Pneumonia     age 54 or 22   . Anxiety   . Frequent urination   . Arthritis   . Chronic bronchitis   . Colon polyps 08/02/2014  Tubular adenoma x 3, Hyperplastic-1  . Diastolic dysfunction     grade 1 diastolic dysfunction on 2-D echo  . Left ventricular dysfunction     ejection fraction of 25-30% with regional wall motion abnormalities.    Past Surgical History  Procedure Laterality Date  . Craniotomy  11/10    right frontal"hemorrhage evacuation from fall injury"  . Video bronchoscopy  07/28/2012    Procedure: VIDEO BRONCHOSCOPY WITH FLUORO;  Surgeon: Chesley Mires, MD;  Location: [facility];  Service: Cardiopulmonary;  Laterality: Bilateral;  . Hernia repair Bilateral     right and left inguinal  . Knee arthroscopy Right   . Lumbar laminectomy/decompression microdiscectomy Right 12/15/2012    Procedure: LUMBAR LAMINECTOMY/DECOMPRESSION MICRODISCECTOMY 1 LEVEL;   Surgeon: Elaina Hoops, MD;  Location: [facility];  Service: Neurosurgery;  Laterality: Right;  Right Lumbar four-five laminectomy/foraminotomy  . Gallbladder surgery      drain tube placed  . Ercp N/A 11/07/2014    Procedure: ENDOSCOPIC RETROGRADE CHOLANGIOPANCREATOGRAPHY (ERCP);  Surgeon: Irene Shipper, MD;  Location: [facility];  Service: Endoscopy;  Laterality: N/A;  . Cholecystectomy N/A 11/08/2014    Procedure: LAPAROSCOPIC CHOLECYSTECTOMY ;  Surgeon: Alphonsa Overall, MD;  Location: [facility];  Service: General;  Laterality: N/A;  . Cardiac catheterization N/A 01/23/2015    Procedure: Right/Left Heart Cath and Coronary Angiography;  Surgeon: Lorretta Harp, MD;  Location: [facility];  Service: Cardiovascular;  Laterality: N/A;    Prior to Admission medications   Medication Sig Start Date End Date Taking? Authorizing Provider  budesonide-formoterol (SYMBICORT) 160-4.5 MCG/ACT inhaler Inhale 2 puffs into the lungs 2 (two) times daily. 05/11/12  Yes Tammy S Parrett, NP  finasteride (PROSCAR) 5 MG tablet Take 5 mg by mouth daily.  01/07/13  Yes Historical Provider, MD  FLUoxetine (PROZAC) 40 MG capsule Take 40 mg by mouth daily with breakfast.    Yes Historical Provider, MD  furosemide (LASIX) 20 MG tablet Take 1 tablet (20 mg total) by mouth daily. 12/29/14  Yes Noralee Space, MD  HYDROcodone-acetaminophen (NORCO/VICODIN) 5-325 MG per tablet Take 2 tablets by mouth 2 (two) times daily as needed (for pain). 08/25/14  Yes Nita Sells, MD  losartan (COZAAR) 50 MG tablet Take 1 tablet (50 mg total) by mouth daily. 12/29/14  Yes Noralee Space, MD  meperidine (DEMEROL) 50 MG tablet Take 50 mg by mouth every 6 (six) hours as needed for severe pain.   Yes Historical Provider, MD  metoprolol tartrate (LOPRESSOR) 25 MG tablet Take 1 tablet (25 mg total) by mouth 2 (two) times daily. 02/15/15  Yes Lorretta Harp, MD  Multiple Vitamin (MULTIVITAMIN) tablet Take 1 tablet by mouth daily.   Yes  Historical Provider, MD  OXYGEN Inhale 2 L/min into the lungs at bedtime.   Yes Historical Provider, MD  PROAIR HFA 108 (90 BASE) MCG/ACT inhaler 1-2 PUFFS EVERY 4-6 HOURS AS NEEDED Patient taking differently: 1-2 PUFFS EVERY 4-6 HOURS AS NEEDED for shortness of breath 11/15/14  Yes Chesley Mires, MD  simvastatin (ZOCOR) 20 MG tablet Take 1 tablet by mouth every evening.  04/10/14  Yes Historical Provider, MD  sodium chloride (OCEAN) 0.65 % SOLN nasal spray Place 1-2 sprays into both nostrils daily as needed for congestion.   Yes Historical Provider, MD  traZODone (DESYREL) 100 MG tablet Take 1 tablet (100 mg total) by mouth at bedtime. 08/25/14  Yes Nita Sells, MD    Social History:  reports that he  quit smoking about 6 years ago. His smoking use included Pipe. He has never used smokeless tobacco. He reports that he does not drink alcohol or use illicit drugs.  Family History  Problem Relation Age of Onset  . Coronary artery disease    . Colon cancer Neg Hx   . Pancreatic cancer Neg Hx   . Rectal cancer Neg Hx   . Esophageal cancer Neg Hx   . Stomach cancer Paternal Grandmother   . Heart disease Mother   . Heart disease Father   . Colon polyps Brother   . Congestive Heart Failure Brother     Review of Systems:  Constitutional: Denies fever, chills, diaphoresis, appetite change and fatigue.  HEENT: Denies photophobia, eye pain, redness, hearing loss, ear pain, congestion, sore throat, rhinorrhea, sneezing, mouth sores, trouble swallowing, neck pain, neck stiffness and tinnitus.   Respiratory: Denies chest tightness.   Cardiovascular: Denies chest pain, palpitations and leg swelling.  Gastrointestinal: Denies nausea, vomiting, diarrhea, constipation, blood in stool and abdominal distention.  Genitourinary: Denies dysuria, urgency, frequency, hematuria, flank pain and difficulty urinating.  Endocrine: Denies: hot or cold intolerance, sweats, changes in hair or nails, polyuria,  polydipsia. Musculoskeletal: Denies myalgias, back pain, joint swelling, arthralgias and gait problem.  Skin: Denies pallor, rash and wound.  Neurological: Denies dizziness, seizures, syncope, weakness, light-headedness, numbness and headaches.  Hematological: Denies adenopathy. Easy bruising, personal or family bleeding history  Psychiatric/Behavioral: Denies suicidal ideation, mood changes, confusion, nervousness, sleep disturbance and agitation   Physical Exam: Blood pressure 111/72, pulse 83, temperature 98.3 F (36.8 C), temperature source Oral, resp. rate 20, SpO2 99 %. General: Alert, awake, oriented 3, currently wearing nasal cannula and breathing comfortably. HEENT: Normocephalic, atraumatic, pupils equal round and reactive to light, extraocular movements, poor dentition, moist mucous membranes. Neck: Supple, no JVD, no lymphadenopathy, no bruits, no quarter. Cardiovascular: Regular rate and rhythm Lungs: Mild expiratory wheezes, positive rhonchi Abdomen: Obese, soft, nontender, nondistended, positive bowel sounds. Next line extremities: No clubbing, cyanosis or edema, positive pulses. Neurologic: Grossly intact and nonfocal. I have not ambulated him.  Labs on Admission:  Results for orders placed or performed during the hospital encounter of 02/24/15 (from the past 48 hour(s))  Brain natriuretic peptide     Status: Abnormal   Collection Time: 02/24/15  7:17 AM  Result Value Ref Range   B Natriuretic Peptide 424.0 (H) 0.0 - 100.0 pg/mL  Troponin I     Status: None   Collection Time: 02/24/15  7:17 AM  Result Value Ref Range   Troponin I <0.03 <0.031 ng/mL    Comment:        NO INDICATION OF MYOCARDIAL INJURY.   CBC with Differential/Platelet     Status: Abnormal   Collection Time: 02/24/15  7:17 AM  Result Value Ref Range   WBC 14.6 (H) 4.0 - 10.5 K/uL   RBC 5.60 4.22 - 5.81 MIL/uL   Hemoglobin 15.9 13.0 - 17.0 g/dL   HCT 47.4 39.0 - 52.0 %   MCV 84.6 78.0 - 100.0 fL    MCH 28.4 26.0 - 34.0 pg   MCHC 33.5 30.0 - 36.0 g/dL   RDW 15.6 (H) 11.5 - 15.5 %   Platelets 238 150 - 400 K/uL   Neutrophils Relative % 56 43 - 77 %   Neutro Abs 8.2 (H) 1.7 - 7.7 K/uL   Lymphocytes Relative 31 12 - 46 %   Lymphs Abs 4.5 (H) 0.7 - 4.0 K/uL   Monocytes  Relative 10 3 - 12 %   Monocytes Absolute 1.4 (H) 0.1 - 1.0 K/uL   Eosinophils Relative 3 0 - 5 %   Eosinophils Absolute 0.4 0.0 - 0.7 K/uL   Basophils Relative 0 0 - 1 %   Basophils Absolute 0.0 0.0 - 0.1 K/uL  Comprehensive metabolic panel     Status: Abnormal   Collection Time: 02/24/15  7:17 AM  Result Value Ref Range   Sodium 138 135 - 145 mmol/L   Potassium 4.7 3.5 - 5.1 mmol/L   Chloride 106 101 - 111 mmol/L   CO2 20 (L) 22 - 32 mmol/L   Glucose, Bld 165 (H) 65 - 99 mg/dL   BUN 18 6 - 20 mg/dL   Creatinine, Ser 1.13 0.61 - 1.24 mg/dL   Calcium 9.0 8.9 - 10.3 mg/dL   Total Protein 7.1 6.5 - 8.1 g/dL   Albumin 3.9 3.5 - 5.0 g/dL   AST 43 (H) 15 - 41 U/L   ALT 17 17 - 63 U/L   Alkaline Phosphatase 65 38 - 126 U/L   Total Bilirubin 1.6 (H) 0.3 - 1.2 mg/dL   GFR calc non Af Amer >60 >60 mL/min   GFR calc Af Amer >60 >60 mL/min    Comment: (NOTE) The eGFR has been calculated using the CKD EPI equation. This calculation has not been validated in all clinical situations. eGFR's persistently <60 mL/min signify possible Chronic Kidney Disease.    Anion gap 12 5 - 15  I-Stat CG4 Lactic Acid, ED     Status: Abnormal   Collection Time: 02/24/15  7:26 AM  Result Value Ref Range   Lactic Acid, Venous 4.74 (HH) 0.5 - 2.0 mmol/L   Comment NOTIFIED PHYSICIAN   Blood gas, arterial     Status: Abnormal   Collection Time: 02/24/15  7:32 AM  Result Value Ref Range   FIO2 1.00    Delivery systems BILEVEL POSITIVE AIRWAY PRESSURE    Mode BILEVEL POSITIVE AIRWAY PRESSURE    Inspiratory PAP 14    Expiratory PAP 6.0    pH, Arterial 7.295 (L) 7.350 - 7.450   pCO2 arterial 44.0 35.0 - 45.0 mmHg   pO2, Arterial 336  (H) 80.0 - 100.0 mmHg   Bicarbonate 20.8 20.0 - 24.0 mEq/L   TCO2 18.7 0 - 100 mmol/L   Acid-base deficit 5.2 (H) 0.0 - 2.0 mmol/L   O2 Saturation 99.5 %   Patient temperature 98.6    Collection site LEFT RADIAL    Drawn by COLLECTED BY RT    Sample type ARTERIAL DRAW    Allens test (pass/fail) PASS PASS    Radiological Exams on Admission: Dg Chest Port 1 View  02/24/2015   CLINICAL DATA:  Extreme shortness of breath this morning.  EXAM: PORTABLE CHEST - 1 VIEW  COMPARISON:  01/17/2015  FINDINGS: Cardiomediastinal silhouette is stably enlarged. Mediastinal contours appear intact.  There is no evidence of pneumothorax. There are bilateral patchy airspace consolidations with central and lower lobe predominance. Left hemidiaphragm is obscured which may be due to subpulmonic effusion with associated atelectasis, or more confluent airspace consolidation.  Osseous structures are without acute abnormality. Soft tissues are grossly normal.  IMPRESSION: Interval development of patchy airspace consolidations with central and lower lobe predominance.  Possible left subpulmonic effusion with associated atelectasis versus more confluent left lower lobe airspace consolidation.  The appearance is most suggestive of developing pulmonary edema, however multifocal airspace consolidation may have a similar appearance.  Electronically Signed   By: Fidela Salisbury M.D.   On: 02/24/2015 07:32    Assessment/Plan Principal Problem:   Acute respiratory failure with hypoxemia Active Problems:   Obstructive sleep apnea   Essential hypertension   Hyperlipidemia   Acute on chronic combined systolic and diastolic CHF (congestive heart failure)   CAP (community acquired pneumonia)   COPD with acute exacerbation   Acute hypoxemic respiratory failure -Suspect this is multifactorial due to community-acquired pneumonia, acute on chronic combined CHF, as well as a COPD exacerbation. -Did require use of noninvasive  positive pressure ventilation in the emergency department. -We'll admit to stepdown unit in case he continues to require BiPAP. -Please see below for details.  Acute on chronic combined CHF -Agree with cardiology that his symptoms are mainly pulmonary in origin, however with findings of chest x-ray and the fact that patient has responded well to IV Lasix, I suspect that he also has a component of acute CHF. -Plan to give 40 of Lasix IV twice daily, strict I's and O's, daily weights, a negative fluid balance. -Believe he might need an increased dose of Lasix upon discharge as he is only taking 20 mg daily. -Echo from 01/13/2015: Left ventricle: The cavity size was normal. There was mild focal basal hypertrophy of the septum. Systolic function was severely reduced. The estimated ejection fraction was in the range of 25% to 30%. Diffuse hypokinesis. There is akinesis of the entireinferolateral and inferior myocardium. There is akinesis of the midanteroseptal myocardium. There was an increased relative contribution of atrial contraction to ventricular filling. Doppler parameters are consistent with abnormal left ventricular relaxation (grade 1 diastolic dysfunction).  COPD with acute exacerbation -Nebs, IV steroids, oxygen as needed.  Community-acquired pneumonia -Has not had any recent hospitalizations other than a day procedure for a cath. We'll elect to treat as community-acquired pneumonia. -Started on Rocephin/azithromycin. -Check blood/sputum cultures. -Check Legionella/strep pneumo urinary antigens.  Obstructive sleep apnea -Does not use nightly BiPAP.  Hypertension -Currently well controlled, continue home medications.  Hyperlipidemia -Continue statin.  DVT prophylaxis -Lovenox  CODE STATUS -Full code   Time Spent on Admission: 105 minutes  [facility] Pager: [phone] 02/24/2015, 12:15 PM

## 2015-02-25 ENCOUNTER — Encounter (HOSPITAL_COMMUNITY): Payer: Self-pay | Admitting: Internal Medicine

## 2015-02-25 DIAGNOSIS — R0600 Dyspnea, unspecified: Secondary | ICD-10-CM | POA: Diagnosis present

## 2015-02-25 DIAGNOSIS — J189 Pneumonia, unspecified organism: Secondary | ICD-10-CM

## 2015-02-25 DIAGNOSIS — D860 Sarcoidosis of lung: Secondary | ICD-10-CM

## 2015-02-25 DIAGNOSIS — J9601 Acute respiratory failure with hypoxia: Secondary | ICD-10-CM

## 2015-02-25 DIAGNOSIS — E785 Hyperlipidemia, unspecified: Secondary | ICD-10-CM

## 2015-02-25 DIAGNOSIS — T380X5A Adverse effect of glucocorticoids and synthetic analogues, initial encounter: Secondary | ICD-10-CM

## 2015-02-25 DIAGNOSIS — G4733 Obstructive sleep apnea (adult) (pediatric): Secondary | ICD-10-CM

## 2015-02-25 DIAGNOSIS — R0602 Shortness of breath: Secondary | ICD-10-CM

## 2015-02-25 DIAGNOSIS — J441 Chronic obstructive pulmonary disease with (acute) exacerbation: Secondary | ICD-10-CM

## 2015-02-25 DIAGNOSIS — I10 Essential (primary) hypertension: Secondary | ICD-10-CM

## 2015-02-25 DIAGNOSIS — I5043 Acute on chronic combined systolic (congestive) and diastolic (congestive) heart failure: Principal | ICD-10-CM

## 2015-02-25 DIAGNOSIS — R739 Hyperglycemia, unspecified: Secondary | ICD-10-CM

## 2015-02-25 HISTORY — DX: Hyperglycemia, unspecified: R73.9

## 2015-02-25 HISTORY — DX: Adverse effect of glucocorticoids and synthetic analogues, initial encounter: T38.0X5A

## 2015-02-25 LAB — BASIC METABOLIC PANEL
Anion gap: 10 (ref 5–15)
BUN: 19 mg/dL (ref 6–20)
CHLORIDE: 103 mmol/L (ref 101–111)
CO2: 23 mmol/L (ref 22–32)
Calcium: 9.3 mg/dL (ref 8.9–10.3)
Creatinine, Ser: 0.85 mg/dL (ref 0.61–1.24)
GFR calc non Af Amer: >60 mL/min (ref >60)
Glucose, Bld: 183 mg/dL — ABNORMAL HIGH (ref 65–99)
POTASSIUM: 3.9 mmol/L (ref 3.5–5.1)
SODIUM: 136 mmol/L (ref 135–145)

## 2015-02-25 LAB — CBC
HCT: 41.5 % (ref 39.0–52.0)
Hemoglobin: 13.8 g/dL (ref 13.0–17.0)
MCH: 27.5 pg (ref 26.0–34.0)
MCHC: 33.3 g/dL (ref 30.0–36.0)
MCV: 82.7 fL (ref 78.0–100.0)
PLATELETS: 230 10*3/uL (ref 150–400)
RBC: 5.02 MIL/uL (ref 4.22–5.81)
RDW: 15.3 % (ref 11.5–15.5)
WBC: 7.8 10*3/uL (ref 4.0–10.5)

## 2015-02-25 LAB — GLUCOSE, CAPILLARY
GLUCOSE-CAPILLARY: 157 mg/dL — AB (ref 65–99)
Glucose-Capillary: 161 mg/dL — ABNORMAL HIGH (ref 65–99)
Glucose-Capillary: 160 mg/dL — ABNORMAL HIGH (ref 65–99)

## 2015-02-25 LAB — HIV ANTIBODY (ROUTINE TESTING W REFLEX): HIV Screen 4th Generation wRfx: NONREACTIVE

## 2015-02-25 LAB — LACTIC ACID, PLASMA: LACTIC ACID, VENOUS: 2.5 mmol/L — AB (ref 0.5–2.0)

## 2015-02-25 LAB — TROPONIN I: Troponin I: <0.03 ng/mL (ref <0.031)

## 2015-02-25 MED ORDER — INSULIN ASPART 100 UNIT/ML ~~LOC~~ SOLN
0.0000 [IU] | Freq: Every day | SUBCUTANEOUS | Status: DC
Start: 2015-02-25 — End: 2015-02-27

## 2015-02-25 MED ORDER — INSULIN ASPART 100 UNIT/ML ~~LOC~~ SOLN
0.0000 [IU] | Freq: Three times a day (TID) | SUBCUTANEOUS | Status: DC
  Administered 2015-02-25 (×2): 3 [IU] via SUBCUTANEOUS
  Administered 2015-02-26: 2 [IU] via SUBCUTANEOUS
  Administered 2015-02-26: 3 [IU] via SUBCUTANEOUS
  Administered 2015-02-26 – 2015-02-27 (×2): 2 [IU] via SUBCUTANEOUS

## 2015-02-25 MED ORDER — IPRATROPIUM-ALBUTEROL 0.5-2.5 (3) MG/3ML IN SOLN
3.0000 mL | Freq: Three times a day (TID) | RESPIRATORY_TRACT | Status: DC
  Administered 2015-02-25 – 2015-02-27 (×7): 3 mL via RESPIRATORY_TRACT
  Filled 2015-02-25 (×7): qty 3

## 2015-02-25 MED ORDER — SODIUM CHLORIDE 0.9 % IV BOLUS (SEPSIS)
250.0000 mL | Freq: Once | INTRAVENOUS | Status: AC
  Administered 2015-02-25: 250 mL via INTRAVENOUS

## 2015-02-25 NOTE — Progress Notes (Signed)
Received pt from ICU, alert and oriented, VS obtained, telemetry applied, oriented to unit and call light placed within reach

## 2015-02-25 NOTE — Progress Notes (Signed)
Patient Name: Travis Ferguson Date of Encounter: 02/25/2015  Principal Problem:   Acute respiratory failure with hypoxemia Active Problems:   Obstructive sleep apnea   Pulmonary sarcoidosis   Essential hypertension   Hyperlipidemia   Acute on chronic combined systolic and diastolic CHF (congestive heart failure)   CAP (community acquired pneumonia)   COPD with acute exacerbation   Steroid-induced hyperglycemia   Length of Stay: 1  SUBJECTIVE  Improved, not back to baseline. Cough improved. Was producing yellowish thick sputum in copious amounts until today. Wheezing. Good UO, 2.5L negative balance since admission.  CURRENT MEDS . antiseptic oral rinse  7 mL Mouth Rinse BID  . azithromycin  500 mg Oral Q24H  . budesonide-formoterol  2 puff Inhalation BID  . cefTRIAXone (ROCEPHIN)  IV  1 g Intravenous Q24H  . enoxaparin (LOVENOX) injection  55 mg Subcutaneous Q24H  . finasteride  5 mg Oral Daily  . FLUoxetine  40 mg Oral Q breakfast  . furosemide  40 mg Intravenous BID  . ipratropium-albuterol  3 mL Nebulization TID  . losartan  50 mg Oral Daily  . methylPREDNISolone (SOLU-MEDROL) injection  60 mg Intravenous 3 times per day  . metoprolol tartrate  25 mg Oral BID  . multivitamin with minerals  1 tablet Oral Daily  . simvastatin  20 mg Oral QPM  . sodium chloride  3 mL Intravenous Q12H  . traZODone  100 mg Oral QHS    OBJECTIVE   Intake/Output Summary (Last 24 hours) at 02/25/15 0828 Last data filed at 02/25/15 0600  Gross per 24 hour  Intake    490 ml  Output   2975 ml  Net  -2485 ml   Filed Weights   02/24/15 1313 02/25/15 0326 02/25/15 0800  Weight: 241 lb 10 oz (109.6 kg) 241 lb 10 oz (109.6 kg) 242 lb 4.6 oz (109.9 kg)    PHYSICAL EXAM Filed Vitals:   02/25/15 0600 02/25/15 0630 02/25/15 0700 02/25/15 0800  BP:      Pulse: 80 90 83   Temp:      TempSrc:      Resp: '24 23 19   '$ Height:      Weight:    242 lb 4.6 oz (109.9 kg)  SpO2: 95% 95% 96%     General: Alert, oriented x3, no distress Head: no evidence of trauma, PERRL, EOMI, no exophtalmos or lid lag, no myxedema, no xanthelasma; normal ears, nose and oropharynx Neck: normal jugular venous pulsations and no hepatojugular reflux; brisk carotid pulses without delay and no carotid bruits Chest: bilateral wheezing and very prolonged expiration, no signs of consolidation by percussion or palpation, normal fremitus, symmetrical and full respiratory excursions Cardiovascular: normal position and quality of the apical impulse, regular rhythm, normal first and second heart sounds, no rubs or gallops, no murmur Abdomen: no tenderness or distention, no masses by palpation, no abnormal pulsatility or arterial bruits, normal bowel sounds, no hepatosplenomegaly Extremities: no clubbing, cyanosis or edema; 2+ radial, ulnar and brachial pulses bilaterally; 2+ right femoral, posterior tibial and dorsalis pedis pulses; 2+ left femoral, posterior tibial and dorsalis pedis pulses; no subclavian or femoral bruits Neurological: grossly nonfocal  LABS  CBC  Recent Labs  02/24/15 0717 02/25/15 0208  WBC 14.6* 7.8  NEUTROABS 8.2*  --   HGB 15.9 13.8  HCT 47.4 41.5  MCV 84.6 82.7  PLT 238 191   Basic Metabolic Panel  Recent Labs  02/24/15 0717 02/25/15 0208  NA 138 136  K 4.7 3.9  CL 106 103  CO2 20* 23  GLUCOSE 165* 183*  BUN 18 19  CREATININE 1.13 0.85  CALCIUM 9.0 9.3   Liver Function Tests  Recent Labs  02/24/15 0717  AST 43*  ALT 17  ALKPHOS 65  BILITOT 1.6*  PROT 7.1  ALBUMIN 3.9   No results for input(s): LIPASE, AMYLASE in the last 72 hours. Cardiac Enzymes  Recent Labs  02/24/15 1435 02/24/15 2000 02/25/15 0208  TROPONINI 0.03 <0.03 <0.03     Recent Labs  02/24/15 1435  TSH 0.773    Radiology Studies Imaging results have been reviewed and Dg Chest Port 1 View  02/24/2015   CLINICAL DATA:  Extreme shortness of breath this morning.  EXAM: PORTABLE  CHEST - 1 VIEW  COMPARISON:  01/17/2015  FINDINGS: Cardiomediastinal silhouette is stably enlarged. Mediastinal contours appear intact.  There is no evidence of pneumothorax. There are bilateral patchy airspace consolidations with central and lower lobe predominance. Left hemidiaphragm is obscured which may be due to subpulmonic effusion with associated atelectasis, or more confluent airspace consolidation.  Osseous structures are without acute abnormality. Soft tissues are grossly normal.  IMPRESSION: Interval development of patchy airspace consolidations with central and lower lobe predominance.  Possible left subpulmonic effusion with associated atelectasis versus more confluent left lower lobe airspace consolidation.  The appearance is most suggestive of developing pulmonary edema, however multifocal airspace consolidation may have a similar appearance.   Electronically Signed   By: Fidela Salisbury M.D.   On: 02/24/2015 07:32    TELE Sinus rhythm, frequent PVCs/couplets, brief 4-beat NSVT  ECG Sinus tachycardia, PVCs, inferolateral ST depression  ASSESSMENT AND PLAN  Overall impression is primarily that of COPD exacerbation, in turn a trigger for acute exacerbation of CHF. His weight today is at his usual baseline. His BNP was lower than recorded earlier this summer. However, note that at the time of his right and left heart cath on August 1, weight was recorded at 240 lb and his PAWP mean was 25 mm Hg. He is probably always a little "too wet", making him vulnerable to acute decompensation, Renal function is excellent, would try to push his dry weight down more, maybe to around 235 lb. Keep on IV diuretics another day. He took a new Rx for mirabegron for 2 days before the current admission, but his BP did not change. I am not convinced this had anything to do with the acute heart failure, but BP should be very cautiously monitored if he restarts this medication and unless it produces clear  symptomatic improvement, should not be continued long term.   Sanda Klein, MD, [facility] [phone] office [phone] pager 02/25/2015 8:28 AM

## 2015-02-25 NOTE — Progress Notes (Signed)
Progress Note   Travis Ferguson HAL:[id number] DOB: 02/14/1941 DOA: 03/03/15 PCP: Jerlyn Ly, MD   Brief Narrative:   Travis Ferguson is an 74 y.o. 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 In the ED, the patient was started on BiPAP and a chest x-ray showed findings consistent with pneumonia versus pulmonary edema. His lactic acid was 4.74.  Assessment/Plan:   Principal Problem: Acute hypoxemic respiratory failure - Suspect this is multifactorial due to CAP, acute on chronic combined CHF, as well as a COPD exacerbation. - Did require use of noninvasive positive pressure ventilation in the emergency department. - See specific recommendations regarding underlying conditions below.  Active problems:  Steroid-induced hyperglycemia - Start moderate scale SSI.  Acute on chronic combined CHF - Evaluated by cardiologist who felt symptoms were possibly more pulmonary in nature. - Continue 40 of Lasix IV twice daily, strict I's and O's, daily weights, maintain a negative fluid balance. - May need an increased dose of Lasix upon discharge as he is only taking 20 mg daily. - Echo from 01/13/2015: EF 25% to 30%. Diffuse hypokinesis. Grade 1 diastolic dysfunction. - I/O- 2.5 L. Weight unchanged. Goal weight 235 pounds per cardiology.  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  Community-acquired pneumonia - Continue empiric Rocephin/azithromycin. - Follow-up blood/sputum cultures. - Follow-up Legionella antigen, strep pneumo negative.  Obstructive sleep  apnea - Does not use nightly BiPAP.  Hypertension - Currently well controlled, continue home medications including Cozaar and metoprolol.  Hyperlipidemia - Continue statin.  DVT prophylaxis - Lovenox.  Family Communication: No family at bedside. Disposition Plan: Home when respiratory status stable, likely another 1-2 days. Lives with wife. Code Status:     Code Status Orders        Start     Ordered   March 03, 2015 1317  Full code   Continuous     03-03-2015 1316        IV Access:    Peripheral IV   Procedures and diagnostic studies:   Dg Chest Port 1 View  03-03-2015   CLINICAL DATA:  Extreme shortness of breath this morning.  EXAM: PORTABLE CHEST - 1 VIEW  COMPARISON:  01/17/2015  FINDINGS: Cardiomediastinal silhouette is stably enlarged. Mediastinal contours appear intact.  There is no evidence of pneumothorax. There are bilateral patchy airspace consolidations with central and lower lobe predominance. Left hemidiaphragm is obscured which may be due to subpulmonic effusion with associated atelectasis, or more confluent airspace consolidation.  Osseous structures are without acute abnormality. Soft tissues are grossly normal.  IMPRESSION: Interval development of patchy airspace consolidations with central and lower lobe predominance.  Possible left subpulmonic effusion with associated atelectasis versus more confluent left lower lobe airspace consolidation.  The appearance is most suggestive of developing pulmonary edema, however multifocal airspace consolidation may have a similar appearance.   Electronically Signed   By: Fidela Salisbury M.D.   On: 2015/03/03 07:32     Medical Consultants:    Cardiology: Darlin Coco, MD.  Anti-Infectives:    Rocephin 03-Mar-2015--->  Azithromycin 03-03-2015--->  Subjective:    [name]   Objective:  Filed Vitals:   02/25/15 0700 02/25/15 0800 02/25/15 0900 02/25/15 0940  BP:  137/74    Pulse: 83 81 86   Temp:  98  F (36.7 C)    TempSrc:  Oral    Resp: '19 18 19   '$ Height:      Weight:  109.9 kg (242 lb 4.6 oz)    SpO2: 96% 96% 96% 98%    Intake/Output Summary (Last 24 hours) at 02/25/15 1023 Last data filed at 02/25/15 0859  Gross per 24 hour  Intake    490 ml  Output   2850 ml  Net  -2360 ml    Exam: Gen:  NAD Cardiovascular:  RRR, No M/R/G Respiratory:  Lungs with expiratory wheezes Gastrointestinal:  Abdomen soft, NT/ND, + BS Extremities:  No C/E/C   Data Reviewed:    Labs: Basic Metabolic Panel:  Recent Labs Lab 02/24/15 0717 02/25/15 0208  NA 138 136  K 4.7 3.9  CL 106 103  CO2 20* 23  GLUCOSE 165* 183*  BUN 18 19  CREATININE 1.13 0.85  CALCIUM 9.0 9.3   GFR Estimated Creatinine Clearance: 97.6 mL/min (by C-G formula based on Cr of 0.85). Liver Function Tests:  Recent Labs Lab 02/24/15 0717  AST 43*  ALT 17  ALKPHOS 65  BILITOT 1.6*  PROT 7.1  ALBUMIN 3.9   CBC:  Recent Labs Lab 02/24/15 0717 02/25/15 0208  WBC 14.6* 7.8  NEUTROABS 8.2*  --   HGB 15.9 13.8  HCT 47.4 41.5  MCV 84.6 82.7  PLT 238 230   Cardiac Enzymes:  Recent Labs Lab 02/24/15 0717 02/24/15 1435 02/24/15 2000 02/25/15 0208  TROPONINI <0.03 0.03 <0.03 <0.03   BNP (last 3 results)  Recent Labs  03/17/14 1305 12/29/14 1539  PROBNP 235.0* 534.0*   CBG: No results for input(s): GLUCAP in the last 168 hours.  Thyroid function studies:  Recent Labs  02/24/15 1435  TSH 0.773   Sepsis Labs:  Recent Labs Lab 02/24/15 0717 02/24/15 0726 02/24/15 1233 02/25/15 0205 02/25/15 0208  WBC 14.6*  --   --   --  7.8  LATICACIDVEN  --  4.74* 0.98 2.5*  --    Microbiology Recent Results (from the past 240 hour(s))  MRSA PCR Screening     Status: None   Collection Time: 02/24/15  1:20 PM  Result Value Ref Range Status   MRSA by PCR NEGATIVE NEGATIVE Final    Comment:        The GeneXpert MRSA Assay (FDA approved for NASAL specimens only), is one component of  a comprehensive MRSA colonization surveillance program. It is not intended to diagnose MRSA infection nor to guide or monitor treatment for MRSA infections.      Medications:   . antiseptic oral rinse  7 mL Mouth Rinse BID  . azithromycin  500 mg Oral Q24H  . budesonide-formoterol  2 puff Inhalation BID  . cefTRIAXone (ROCEPHIN)  IV  1 g Intravenous Q24H  . enoxaparin (LOVENOX) injection  55 mg Subcutaneous Q24H  . finasteride  5 mg Oral Daily  . FLUoxetine  40 mg Oral Q breakfast  . furosemide  40 mg Intravenous BID  . ipratropium-albuterol  3 mL Nebulization TID  . losartan  50 mg Oral Daily  . methylPREDNISolone (SOLU-MEDROL) injection  60 mg Intravenous 3 times per day  . metoprolol tartrate  25 mg Oral BID  . multivitamin with minerals  1 tablet Oral Daily  . simvastatin  20 mg Oral QPM  . sodium chloride  3 mL Intravenous Q12H  . traZODone  100 mg Oral QHS   Continuous Infusions:   Time spent: 35 minutes with > 50% of time discussing current diagnostic test results, clinical impression and plan of care.    LOS: 1 day   RAMA,CHRISTINA  [facility] Pager [phone]. If unable to reach me by pager, please call my cell phone at [phone].  *Please refer to [email or web address], password [email or web address] to get updated schedule on who will round on this patient, as hospitalists switch teams weekly. If 7PM-7AM, please contact night-coverage at [email or web address], password [email or web address] for any overnight needs.  02/25/2015, 10:23 AM

## 2015-02-25 NOTE — Progress Notes (Signed)
CRITICAL VALUE ALERT  Critical value received:  Lactic Acid 2.5  Date of notification:  02-25-15  Time of notification:  03:12  Critical value read back:Yes.    Nurse who received alert:  Lenox Ahr  MD notified (1st page):  [facility] on Call  Time of first page:  03:13  MD notified (2nd page): N/A  Time of second page: N/A  Responding MD:  Fredirick Maudlin  Time MD responded:  03:15

## 2015-02-26 LAB — GLUCOSE, CAPILLARY
Glucose-Capillary: 153 mg/dL — ABNORMAL HIGH (ref 65–99)
Glucose-Capillary: 146 mg/dL — ABNORMAL HIGH (ref 65–99)
Glucose-Capillary: 133 mg/dL — ABNORMAL HIGH (ref 65–99)
Glucose-Capillary: 170 mg/dL — ABNORMAL HIGH (ref 65–99)

## 2015-02-26 LAB — BASIC METABOLIC PANEL
ANION GAP: 9 (ref 5–15)
BUN: 25 mg/dL — ABNORMAL HIGH (ref 6–20)
CO2: 25 mmol/L (ref 22–32)
Calcium: 9.5 mg/dL (ref 8.9–10.3)
Chloride: 103 mmol/L (ref 101–111)
Creatinine, Ser: 1.01 mg/dL (ref 0.61–1.24)
GFR calc Af Amer: >60 mL/min (ref >60)
GFR calc non Af Amer: >60 mL/min (ref >60)
Glucose, Bld: 159 mg/dL — ABNORMAL HIGH (ref 65–99)
POTASSIUM: 4.1 mmol/L (ref 3.5–5.1)
Sodium: 137 mmol/L (ref 135–145)

## 2015-02-26 LAB — EXPECTORATED SPUTUM ASSESSMENT W REFEX TO RESP CULTURE

## 2015-02-26 LAB — EXPECTORATED SPUTUM ASSESSMENT W GRAM STAIN, RFLX TO RESP C

## 2015-02-26 MED ORDER — SPIRONOLACTONE 25 MG PO TABS
25.0000 mg | ORAL_TABLET | Freq: Every day | ORAL | Status: DC
  Administered 2015-02-26 – 2015-02-27 (×2): 25 mg via ORAL
  Filled 2015-02-26 (×2): qty 1

## 2015-02-26 MED ORDER — METHYLPREDNISOLONE SODIUM SUCC 125 MG IJ SOLR
60.0000 mg | Freq: Two times a day (BID) | INTRAMUSCULAR | Status: AC
  Administered 2015-02-26: 60 mg via INTRAVENOUS
  Filled 2015-02-26: qty 0.96

## 2015-02-26 MED ORDER — PREDNISONE 50 MG PO TABS
60.0000 mg | ORAL_TABLET | Freq: Every day | ORAL | Status: DC
  Administered 2015-02-27: 60 mg via ORAL
  Filled 2015-02-26: qty 1

## 2015-02-26 MED ORDER — FUROSEMIDE 40 MG PO TABS
40.0000 mg | ORAL_TABLET | Freq: Every day | ORAL | Status: DC
  Administered 2015-02-27: 40 mg via ORAL
  Filled 2015-02-26: qty 1

## 2015-02-26 MED ORDER — GUAIFENESIN ER 600 MG PO TB12
600.0000 mg | ORAL_TABLET | Freq: Two times a day (BID) | ORAL | Status: DC
  Administered 2015-02-26 – 2015-02-27 (×3): 600 mg via ORAL
  Filled 2015-02-26 (×3): qty 1

## 2015-02-26 NOTE — Progress Notes (Signed)
Progress Note   Travis Ferguson:[id number] DOB: 06/02/1941 DOA: 02/24/2015 PCP: Travis Ly, MD   Brief Narrative:   ELIYOHU CLASS is an 74 y.o. 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 In the ED, the patient was started on BiPAP and a chest x-ray showed findings consistent with pneumonia versus pulmonary edema. His lactic acid was 4.74.  Assessment/Plan:   Principal Problem: Acute hypoxemic respiratory failure - Suspect this is multifactorial due to CAP, acute on chronic combined CHF, as well as a COPD exacerbation. - Did require use of noninvasive positive pressure ventilation in the emergency department. - See specific recommendations regarding underlying conditions below.  Active problems:  Steroid-induced hyperglycemia - Currently being managed with moderate scale SSI. CBGs 153-161.  Acute on chronic combined CHF - Evaluated by cardiologist who felt symptoms were possibly more pulmonary in nature. - Continue 40 of Lasix IV twice daily, strict I's and O's, daily weights, maintain a negative fluid balance. - May need an increased dose of Lasix upon discharge as he is only taking 20 mg daily. - Echo from 01/13/2015: EF 25% to 30%. Diffuse hypokinesis. Grade 1 diastolic dysfunction. - I/O- 2.2 L. Goal weight 235 pounds per cardiology. Weight currently 238 pounds. - Spironolactone added 02/26/15.  Fluid restrict to 1.5 L/24 hours.  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 - Wean Solu-Medrol as tolerated. Currently receiving 60 mg every 8 hours.  Wean  to every 12 hours today. - Add Mucinex.  Community-acquired pneumonia - Continue empiric Rocephin/azithromycin. - Follow-up blood/sputum cultures. - Follow-up Legionella antigen, strep pneumo negative.  Obstructive sleep apnea - Does not use nightly BiPAP.  Hypertension - Currently well controlled, continue home medications including Cozaar and metoprolol.  Hyperlipidemia - Continue statin.  DVT prophylaxis - Lovenox.  Family Communication: No family at bedside. Declines my offer to call. Disposition Plan: Home when respiratory status stable, likely tomorrow. Lives with wife. Code Status:     Code Status Orders        Start     Ordered   02/24/15 1317  Full code   Continuous     02/24/15 1316        IV Access:    Peripheral IV   Procedures and diagnostic studies:   No results found.   Medical Consultants:    Cardiology: Darlin Coco, MD.  Anti-Infectives:    Rocephin 02/24/15--->  Azithromycin 02/24/15--->  Subjective:   [name] feels better.  Less dyspneic.  Cough productive of white mucous.  No BM since yesterday.  Appetite OK.  No chest pain, has some back pain.  Objective:    Filed Vitals:   02/25/15 2010 02/25/15 2104 02/26/15 0512 02/26/15 0736  BP:  121/86 140/80   Pulse:  85 87   Temp:  97.8 F (36.6 C) 97.6 F (36.4 C)   TempSrc:  Oral Oral   Resp:  20 20   Height:      Weight:   108.364 kg (238 lb 14.4 oz)   SpO2: 96% 96% 98% 94%    Intake/Output Summary (Last 24 hours) at 02/26/15 8185 Last data  filed at 02/26/15 0803  Gross per 24 hour  Intake    600 ml  Output   2800 ml  Net  -2200 ml   Filed Weights   02/25/15 0326 02/25/15 0800 02/26/15 0512  Weight: 109.6 kg (241 lb 10 oz) 109.9 kg (242 lb 4.6 oz) 108.364 kg (238 lb 14.4 oz)    Exam: Gen:  NAD Cardiovascular:  RRR, No M/R/G Respiratory:  Lungs with expiratory wheezes, no rales Gastrointestinal:  Abdomen soft, NT/ND, + BS Extremities:  No  C/E/C   Data Reviewed:    Labs: Basic Metabolic Panel:  Recent Labs Lab 02/24/15 0717 02/25/15 0208 02/26/15 0522  NA 138 136 137  K 4.7 3.9 4.1  CL 106 103 103  CO2 20* 23 25  GLUCOSE 165* 183* 159*  BUN 18 19 25*  CREATININE 1.13 0.85 1.01  CALCIUM 9.0 9.3 9.5   GFR Estimated Creatinine Clearance: 81.6 mL/min (by C-G formula based on Cr of 1.01). Liver Function Tests:  Recent Labs Lab 02/24/15 0717  AST 43*  ALT 17  ALKPHOS 65  BILITOT 1.6*  PROT 7.1  ALBUMIN 3.9   CBC:  Recent Labs Lab 02/24/15 0717 02/25/15 0208  WBC 14.6* 7.8  NEUTROABS 8.2*  --   HGB 15.9 13.8  HCT 47.4 41.5  MCV 84.6 82.7  PLT 238 230   Cardiac Enzymes:  Recent Labs Lab 02/24/15 0717 02/24/15 1435 02/24/15 2000 02/25/15 0208  TROPONINI <0.03 0.03 <0.03 <0.03   BNP (last 3 results)  Recent Labs  03/17/14 1305 12/29/14 1539  PROBNP 235.0* 534.0*   CBG:  Recent Labs Lab 02/25/15 1259 02/25/15 1631 02/25/15 2100 02/26/15 0728  GLUCAP 161* 160* 157* 153*    Thyroid function studies:  Recent Labs  02/24/15 1435  TSH 0.773   Sepsis Labs:  Recent Labs Lab 02/24/15 0717 02/24/15 0726 02/24/15 1233 02/25/15 0205 02/25/15 0208  WBC 14.6*  --   --   --  7.8  LATICACIDVEN  --  4.74* 0.98 2.5*  --    Microbiology Recent Results (from the past 240 hour(s))  Blood culture (routine x 2)     Status: None (Preliminary result)   Collection Time: 02/24/15  7:20 AM  Result Value Ref Range Status   Specimen Description BLOOD RIGHT FOREARM  Final   Special Requests BOTTLES DRAWN AEROBIC AND ANAEROBIC 5CC  Final   Culture   Final    NO GROWTH < 24 HOURS Performed at [facility]    Report Status PENDING  Incomplete  Blood culture (routine x 2)     Status: None (Preliminary result)   Collection Time: 02/24/15  9:48 AM  Result Value Ref Range Status   Specimen Description BLOOD RIGHT ANTECUBITAL  Final   Special Requests BOTTLES DRAWN AEROBIC AND  ANAEROBIC 5CC  Final   Culture   Final    NO GROWTH < 24 HOURS Performed at [facility]    Report Status PENDING  Incomplete  MRSA PCR Screening     Status: None   Collection Time: 02/24/15  1:20 PM  Result Value Ref Range Status   MRSA by PCR NEGATIVE NEGATIVE Final    Comment:        The GeneXpert MRSA Assay (FDA approved for NASAL specimens only), is one component of a comprehensive MRSA colonization surveillance program. It is not intended to diagnose MRSA infection nor to guide or monitor treatment for MRSA infections.   Culture, sputum-assessment  Status: None   Collection Time: 02/26/15  8:00 AM  Result Value Ref Range Status   Specimen Description SPUTUM  Final   Special Requests NONE  Final   Sputum evaluation   Final    THIS SPECIMEN IS ACCEPTABLE. RESPIRATORY CULTURE REPORT TO FOLLOW.   Report Status 02/26/2015 FINAL  Final     Medications:   . antiseptic oral rinse  7 mL Mouth Rinse BID  . azithromycin  500 mg Oral Q24H  . budesonide-formoterol  2 puff Inhalation BID  . cefTRIAXone (ROCEPHIN)  IV  1 g Intravenous Q24H  . enoxaparin (LOVENOX) injection  55 mg Subcutaneous Q24H  . finasteride  5 mg Oral Daily  . FLUoxetine  40 mg Oral Q breakfast  . furosemide  40 mg Intravenous BID  . insulin aspart  0-15 Units Subcutaneous TID WC  . insulin aspart  0-5 Units Subcutaneous QHS  . ipratropium-albuterol  3 mL Nebulization TID  . losartan  50 mg Oral Daily  . methylPREDNISolone (SOLU-MEDROL) injection  60 mg Intravenous 3 times per day  . metoprolol tartrate  25 mg Oral BID  . multivitamin with minerals  1 tablet Oral Daily  . simvastatin  20 mg Oral QPM  . sodium chloride  3 mL Intravenous Q12H  . traZODone  100 mg Oral QHS   Continuous Infusions:   Time spent: 25 minutes.    LOS: 2 days   RAMA,CHRISTINA  [facility] Pager [phone]. If unable to reach me by pager, please call my cell phone at [phone].  *Please refer to  [email or web address], password [email or web address] to get updated schedule on who will round on this patient, as hospitalists switch teams weekly. If 7PM-7AM, please contact night-coverage at [email or web address], password [email or web address] for any overnight needs.  02/26/2015, 8:24 AM

## 2015-02-26 NOTE — Progress Notes (Signed)
Patient Name: Travis Ferguson Date of Encounter: 02/26/2015  Principal Problem:   Acute respiratory failure with hypoxemia Active Problems:   Obstructive sleep apnea   Pulmonary sarcoidosis   Essential hypertension   Hyperlipidemia   Acute on chronic combined systolic and diastolic CHF (congestive heart failure)   CAP (community acquired pneumonia)   COPD with acute exacerbation   Steroid-induced hyperglycemia   SOB (shortness of breath)   Length of Stay: 2  SUBJECTIVE   Excellent UO, weight down 3 lb since admission Excellent renal parameters  CURRENT MEDS . antiseptic oral rinse  7 mL Mouth Rinse BID  . azithromycin  500 mg Oral Q24H  . budesonide-formoterol  2 puff Inhalation BID  . cefTRIAXone (ROCEPHIN)  IV  1 g Intravenous Q24H  . enoxaparin (LOVENOX) injection  55 mg Subcutaneous Q24H  . finasteride  5 mg Oral Daily  . FLUoxetine  40 mg Oral Q breakfast  . furosemide  40 mg Intravenous BID  . insulin aspart  0-15 Units Subcutaneous TID WC  . insulin aspart  0-5 Units Subcutaneous QHS  . ipratropium-albuterol  3 mL Nebulization TID  . losartan  50 mg Oral Daily  . methylPREDNISolone (SOLU-MEDROL) injection  60 mg Intravenous 3 times per day  . metoprolol tartrate  25 mg Oral BID  . multivitamin with minerals  1 tablet Oral Daily  . simvastatin  20 mg Oral QPM  . sodium chloride  3 mL Intravenous Q12H  . traZODone  100 mg Oral QHS    OBJECTIVE   Intake/Output Summary (Last 24 hours) at 02/26/15 0927 Last data filed at 02/26/15 4403  Gross per 24 hour  Intake    723 ml  Output   2425 ml  Net  -1702 ml   Filed Weights   02/25/15 0326 02/25/15 0800 02/26/15 0512  Weight: 241 lb 10 oz (109.6 kg) 242 lb 4.6 oz (109.9 kg) 238 lb 14.4 oz (108.364 kg)    PHYSICAL EXAM Filed Vitals:   02/25/15 2010 02/25/15 2104 02/26/15 0512 02/26/15 0736  BP:  121/86 140/80   Pulse:  85 87   Temp:  97.8 F (36.6 C) 97.6 F (36.4 C)   TempSrc:  Oral Oral   Resp:  20 20    Height:      Weight:   238 lb 14.4 oz (108.364 kg)   SpO2: 96% 96% 98% 94%   General: Alert, oriented x3, no distress Head: no evidence of trauma, PERRL, EOMI, no exophtalmos or lid lag, no myxedema, no xanthelasma; normal ears, nose and oropharynx Neck: normal jugular venous pulsations and no hepatojugular reflux; brisk carotid pulses without delay and no carotid bruits Chest: bilateral wheezing and very prolonged expiration, no signs of consolidation by percussion or palpation, normal fremitus, symmetrical and full respiratory excursions Cardiovascular: normal position and quality of the apical impulse, regular rhythm, normal first and second heart sounds, no rubs or gallops, no murmur Abdomen: no tenderness or distention, no masses by palpation, no abnormal pulsatility or arterial bruits, normal bowel sounds, no hepatosplenomegaly Extremities: no clubbing, cyanosis or edema; 2+ radial, ulnar and brachial pulses bilaterally; 2+ right femoral, posterior tibial and dorsalis pedis pulses; 2+ left femoral, posterior tibial and dorsalis pedis pulses; no subclavian or femoral bruits Neurological: grossly nonfocal  LABS  CBC  Recent Labs  02/24/15 0717 02/25/15 0208  WBC 14.6* 7.8  NEUTROABS 8.2*  --   HGB 15.9 13.8  HCT 47.4 41.5  MCV 84.6 82.7  PLT 238  747   Basic Metabolic Panel  Recent Labs  02/25/15 0208 02/26/15 0522  NA 136 137  K 3.9 4.1  CL 103 103  CO2 23 25  GLUCOSE 183* 159*  BUN 19 25*  CREATININE 0.85 1.01  CALCIUM 9.3 9.5   Liver Function Tests  Recent Labs  02/24/15 0717  AST 43*  ALT 17  ALKPHOS 65  BILITOT 1.6*  PROT 7.1  ALBUMIN 3.9   No results for input(s): LIPASE, AMYLASE in the last 72 hours. Cardiac Enzymes  Recent Labs  02/24/15 1435 02/24/15 2000 02/25/15 0208  TROPONINI 0.03 <0.03 <0.03    Recent Labs  02/24/15 1435  TSH 0.773    Radiology Studies Imaging results have been reviewed and No results found.  TELE NSR,  PVCs   ASSESSMENT AND PLAN  Tolerating extra diuresis well. Would switch to PO diuretics (higher dose than on admission) after the next dose of IV furosemide and possible DC tomorrow, with target "dry weight" around 235 lb and early reevaluation in clinic (2 weeks). Will add low dose spironolactone for CHF withsevere LV dysfunction.   Sanda Klein, MD, [facility] [phone] office [phone] pager 02/26/2015 9:27 AM

## 2015-02-27 ENCOUNTER — Encounter (HOSPITAL_COMMUNITY): Payer: Self-pay | Admitting: Internal Medicine

## 2015-02-27 DIAGNOSIS — J9621 Acute and chronic respiratory failure with hypoxia: Secondary | ICD-10-CM | POA: Diagnosis present

## 2015-02-27 LAB — GLUCOSE, CAPILLARY: Glucose-Capillary: 121 mg/dL — ABNORMAL HIGH (ref 65–99)

## 2015-02-27 LAB — LEGIONELLA ANTIGEN, URINE

## 2015-02-27 MED ORDER — SPIRONOLACTONE 25 MG PO TABS: 25.0000 mg | ORAL_TABLET | Freq: Every day | ORAL | Status: DC

## 2015-02-27 MED ORDER — FUROSEMIDE 20 MG PO TABS: 40.0000 mg | ORAL_TABLET | Freq: Every day | ORAL | Status: DC

## 2015-02-27 MED ORDER — ALBUTEROL SULFATE (2.5 MG/3ML) 0.083% IN NEBU: 2.5000 mg | INHALATION_SOLUTION | RESPIRATORY_TRACT | Status: DC | PRN

## 2015-02-27 MED ORDER — PREDNISONE 10 MG (21) PO TBPK: ORAL_TABLET | ORAL | Status: DC

## 2015-02-27 MED ORDER — LEVOFLOXACIN 750 MG PO TABS: 750.0000 mg | ORAL_TABLET | Freq: Every day | ORAL | Status: DC

## 2015-02-27 NOTE — Discharge Summary (Signed)
Physician Discharge Summary  Travis Ferguson VVO:[id number] DOB: 1941-03-05 DOA: 02/24/2015  PCP: Jerlyn Ly, MD  Admit date: 02/24/2015 Discharge date: 02/27/2015   Recommendations for Outpatient Follow-Up:   1. Recommend close F/U with PCP to assess respiratory status, fluid volume status. 2. PCP: Please F/U on final blood and sputum culture, legionella antigen studies. 3. Consider repeat CXR in 4-6 weeks.   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    Discharge Condition: Improved.  Diet recommendation: Low sodium, heart healthy.  1500 cc fluid restriction.   History of Present Illness:   Travis Ferguson is an 74 y.o. 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 In the ED, the patient was started on BiPAP and a chest x-ray showed findings consistent with pneumonia versus pulmonary edema. His lactic acid was 4.74.   Hospital Course by Problem:   Principal Problem: Acute on chronic hypoxemic respiratory failure - Multifactorial due to CAP, acute on chronic combined CHF, as well as a COPD exacerbation. - Did require use of noninvasive positive pressure ventilation in the emergency department. - See  specific recommendations regarding underlying conditions below. - D/C home, already has home oxygen set up.  Active problems:  Steroid-induced hyperglycemia - Currently being managed with moderate scale SSI. CBGs 133-170. - Should resolve with steroid taper.  Acute on chronic combined CHF - Evaluated by cardiologist who felt symptoms were possibly more pulmonary in nature. - Treated with 40 of Lasix IV twice daily, strict I's and O's, daily weights, maintain a negative fluid balance. - Echo from 01/13/2015: EF 25% to 30%. Diffuse hypokinesis. Grade 1 diastolic dysfunction. - I/O- 2.2 L. Goal weight 235 pounds per cardiology. Discharge weight 234 pounds, now at goal. - Spironolactone added 02/26/15. Continue fluid restriction of 1.5 L/24 hours. - D/C on 40 mg Lasix and 25 mg Spironolactone daily.  COPD with acute exacerbation in the setting of pulmonary sarcoidosis - Treated with Nebs, IV steroids, Symbicort, oxygen as needed. - Discharge on a steroid taper.  Community-acquired pneumonia - Treated with empiric Rocephin/azithromycin. D/C home on 4 more days of PO Levaquin. - Follow-up blood/sputum cultures. - Follow-up Legionella antigen, strep pneumo negative.  Obstructive sleep apnea - Does not use nightly BiPAP.  Hypertension - Currently well controlled, continue home medications including Cozaar and metoprolol and diuretics.  Hyperlipidemia - Continue statin.    Medical Consultants:    Cardiology: Darlin Coco, MD.   Discharge Exam:   Filed Vitals:   02/27/15 0535  BP: 140/80  Pulse: 78  Temp: 97.5 F (36.4 C)  Resp: 18   Filed Vitals:   02/26/15 2023 02/26/15 2227 02/27/15 0535 02/27/15 0733  BP:  123/92 140/80   Pulse:  87 78   Temp:  97.4 F (  36.3 C) 97.5 F (36.4 C)   TempSrc:  Oral Oral   Resp:  18 18   Height:      Weight:   106.142 kg (234 lb)   SpO2: 93% 98% 96% 94%    Gen:  Mildly anxious Cardiovascular:  RRR, No M/R/G Respiratory:  Lungs diminished with a few high pitched wheezes Gastrointestinal: Abdomen soft, NT/ND with normal active bowel sounds. Extremities: No C/E/C   The results of significant diagnostics from this hospitalization (including imaging, microbiology, ancillary and laboratory) are listed below for reference.     Procedures and Diagnostic Studies:   Dg Chest Port 1 View  2015/03/06   CLINICAL DATA:  Extreme shortness of breath this morning.  EXAM: PORTABLE CHEST - 1 VIEW  COMPARISON:  01/17/2015  FINDINGS: Cardiomediastinal silhouette is stably enlarged. Mediastinal contours appear intact.  There is no evidence of pneumothorax. There are bilateral patchy airspace consolidations with central and lower lobe predominance. Left hemidiaphragm is obscured which may be due to subpulmonic effusion with associated atelectasis, or more confluent airspace consolidation.  Osseous structures are without acute abnormality. Soft tissues are grossly normal.  IMPRESSION: Interval development of patchy airspace consolidations with central and lower lobe predominance.  Possible left subpulmonic effusion with associated atelectasis versus more confluent left lower lobe airspace consolidation.  The appearance is most suggestive of developing pulmonary edema, however multifocal airspace consolidation may have a similar appearance.   Electronically Signed   By: Fidela Salisbury M.D.   On: 03/06/15 07:32     Labs:   Basic Metabolic Panel:  Recent Labs Lab 03-06-2015 0717 02/25/15 0208 02/26/15 0522  NA 138 136 137  K 4.7 3.9 4.1  CL 106 103 103  CO2 20* 23 25  GLUCOSE 165* 183* 159*  BUN 18 19 25*  CREATININE 1.13 0.85 1.01  CALCIUM 9.0 9.3 9.5   GFR Estimated Creatinine Clearance: 80.8 mL/min (by C-G formula based on Cr of 1.01). Liver Function Tests:  Recent Labs Lab 03-06-2015 0717  AST 43*  ALT 17  ALKPHOS 65  BILITOT 1.6*  PROT 7.1  ALBUMIN 3.9   CBC:  Recent Labs Lab 03-06-15 0717  02/25/15 0208  WBC 14.6* 7.8  NEUTROABS 8.2*  --   HGB 15.9 13.8  HCT 47.4 41.5  MCV 84.6 82.7  PLT 238 230   Cardiac Enzymes:  Recent Labs Lab 03/06/2015 0717 06-Mar-2015 1435 06-Mar-2015 2000 02/25/15 0208  TROPONINI <0.03 0.03 <0.03 <0.03   CBG:  Recent Labs Lab 02/26/15 0728 02/26/15 1159 02/26/15 1620 02/26/15 2226 02/27/15 0726  GLUCAP 153* 146* 133* 170* 121*   Thyroid function studies  Recent Labs  03-06-2015 1435  TSH 0.773   Microbiology Recent Results (from the past 240 hour(s))  Blood culture (routine x 2)     Status: None (Preliminary result)   Collection Time: 2015/03/06  7:20 AM  Result Value Ref Range Status   Specimen Description BLOOD RIGHT FOREARM  Final   Special Requests BOTTLES DRAWN AEROBIC AND ANAEROBIC 5CC  Final   Culture   Final    NO GROWTH 2 DAYS Performed at [facility]    Report Status PENDING  Incomplete  Blood culture (routine x 2)     Status: None (Preliminary result)   Collection Time: March 06, 2015  9:48 AM  Result Value Ref Range Status   Specimen Description BLOOD RIGHT ANTECUBITAL  Final   Special Requests BOTTLES DRAWN AEROBIC AND ANAEROBIC 5CC  Final   Culture  Final    NO GROWTH 2 DAYS Performed at [facility]    Report Status PENDING  Incomplete  MRSA PCR Screening     Status: None   Collection Time: 02/24/15  1:20 PM  Result Value Ref Range Status   MRSA by PCR NEGATIVE NEGATIVE Final    Comment:        The GeneXpert MRSA Assay (FDA approved for NASAL specimens only), is one component of a comprehensive MRSA colonization surveillance program. It is not intended to diagnose MRSA infection nor to guide or monitor treatment for MRSA infections.   Culture, sputum-assessment     Status: None   Collection Time: 02/26/15  8:00 AM  Result Value Ref Range Status   Specimen Description SPUTUM  Final   Special Requests NONE  Final   Sputum evaluation   Final    THIS SPECIMEN IS ACCEPTABLE. RESPIRATORY  CULTURE REPORT TO FOLLOW.   Report Status 02/26/2015 FINAL  Final     Discharge Instructions:   Discharge Instructions    (HEART FAILURE PATIENTS) Call MD:  Anytime you have any of the following symptoms: 1) 3 pound weight gain in 24 hours or 5 pounds in 1 week 2) shortness of breath, with or without a dry hacking cough 3) swelling in the hands, feet or stomach 4) if you have to sleep on extra pillows at night in order to breathe.    Complete by:  As directed      Call MD for:  difficulty breathing, headache or visual disturbances    Complete by:  As directed      Call MD for:  extreme fatigue    Complete by:  As directed      Call MD for:  temperature >100.4    Complete by:  As directed      Diet - low sodium heart healthy    Complete by:  As directed      Discharge instructions    Complete by:  As directed   You were treated for heart failure in the hospital.  To prevent exacerbations of your heart failure, it is important that you check your weight at the same time every day, and that if you gain over 3 pounds in 24 hours or 5 lbs in 1 week, OR you develop worsening swelling to the legs, experience more shortness of breath or chest pain, take an extra dose of Lasix and call your Primary MD or cardiologist.   Follow a heart healthy, low salt diet and restrict your fluid intake to 1.5 liters a day or less.     Increase activity slowly    Complete by:  As directed             Medication List    TAKE these medications        budesonide-formoterol 160-4.5 MCG/ACT inhaler  Commonly known as:  SYMBICORT  Inhale 2 puffs into the lungs 2 (two) times daily.     finasteride 5 MG tablet  Commonly known as:  PROSCAR  Take 5 mg by mouth daily.     FLUoxetine 40 MG capsule  Commonly known as:  PROZAC  Take 40 mg by mouth daily with breakfast.     furosemide 20 MG tablet  Commonly known as:  LASIX  Take 2 tablets (40 mg total) by mouth daily.     HYDROcodone-acetaminophen 5-325 MG  per tablet  Commonly known as:  NORCO/VICODIN  Take 2 tablets by mouth 2 (two)  times daily as needed (for pain).     levofloxacin 750 MG tablet  Commonly known as:  LEVAQUIN  Take 1 tablet (750 mg total) by mouth daily.     losartan 50 MG tablet  Commonly known as:  COZAAR  Take 1 tablet (50 mg total) by mouth daily.     meperidine 50 MG tablet  Commonly known as:  DEMEROL  Take 50 mg by mouth every 6 (six) hours as needed for severe pain.     metoprolol tartrate 25 MG tablet  Commonly known as:  LOPRESSOR  Take 1 tablet (25 mg total) by mouth 2 (two) times daily.     multivitamin tablet  Take 1 tablet by mouth daily.     OXYGEN  Inhale 2 L/min into the lungs at bedtime.     predniSONE 10 MG (21) Tbpk tablet  Commonly known as:  STERAPRED UNI-PAK 21 TAB  Take 6 tablets on 11/28/14, then decrease by 1 pill a day until all pills gone (5 tablets on 11/29/14, etc.)     PROAIR HFA 108 (90 BASE) MCG/ACT inhaler  Generic drug:  albuterol  1-2 PUFFS EVERY 4-6 HOURS AS NEEDED     albuterol (2.5 MG/3ML) 0.083% nebulizer solution  Commonly known as:  PROVENTIL  Take 3 mLs (2.5 mg total) by nebulization every 4 (four) hours as needed for wheezing or shortness of breath.     simvastatin 20 MG tablet  Commonly known as:  ZOCOR  Take 1 tablet by mouth every evening.     sodium chloride 0.65 % Soln nasal spray  Commonly known as:  OCEAN  Place 1-2 sprays into both nostrils daily as needed for congestion.     spironolactone 25 MG tablet  Commonly known as:  ALDACTONE  Take 1 tablet (25 mg total) by mouth daily.     traZODone 100 MG tablet  Commonly known as:  DESYREL  Take 1 tablet (100 mg total) by mouth at bedtime.           Follow-up Information    Follow up with PERINI,MARK A, MD. Schedule an appointment as soon as possible for a visit in 1 week.   Specialty:  Internal Medicine   Why:  Hospital follow up.   Contact information:   [address] [place]  [address] [phone]        Time coordinating discharge: 35 minutes.  Signed:  Liesel Peckenpaugh  Pager [phone] [facility] 02/27/2015, 8:59 AM

## 2015-02-27 NOTE — Progress Notes (Signed)
Patient given discharge, medication and f/u instructions, verbalized understanding, IV and telemetry removed, pt's son to transport home

## 2015-02-27 NOTE — Progress Notes (Signed)
Patient Name: Travis Ferguson Date of Encounter: 02/27/2015  Principal Problem:   Acute on chronic respiratory failure Active Problems:   Obstructive sleep apnea   Pulmonary sarcoidosis   Essential hypertension   Hyperlipidemia   Acute on chronic combined systolic and diastolic CHF (congestive heart failure)   CAP (community acquired pneumonia)   COPD with acute exacerbation   Length of Stay: 3  SUBJECTIVE  Tolerated additional diuresis to 234 lb without hypotension or deterioration in renal function.  CURRENT MEDS . antiseptic oral rinse  7 mL Mouth Rinse BID  . azithromycin  500 mg Oral Q24H  . budesonide-formoterol  2 puff Inhalation BID  . cefTRIAXone (ROCEPHIN)  IV  1 g Intravenous Q24H  . enoxaparin (LOVENOX) injection  55 mg Subcutaneous Q24H  . finasteride  5 mg Oral Daily  . FLUoxetine  40 mg Oral Q breakfast  . furosemide  40 mg Oral Daily  . guaiFENesin  600 mg Oral BID  . insulin aspart  0-15 Units Subcutaneous TID WC  . insulin aspart  0-5 Units Subcutaneous QHS  . ipratropium-albuterol  3 mL Nebulization TID  . losartan  50 mg Oral Daily  . metoprolol tartrate  25 mg Oral BID  . multivitamin with minerals  1 tablet Oral Daily  . predniSONE  60 mg Oral Q breakfast  . simvastatin  20 mg Oral QPM  . sodium chloride  3 mL Intravenous Q12H  . spironolactone  25 mg Oral Daily  . traZODone  100 mg Oral QHS    OBJECTIVE   Intake/Output Summary (Last 24 hours) at 02/27/15 0917 Last data filed at 02/27/15 0847  Gross per 24 hour  Intake   1206 ml  Output    975 ml  Net    231 ml   Filed Weights   02/25/15 0800 02/26/15 0512 02/27/15 0535  Weight: 242 lb 4.6 oz (109.9 kg) 238 lb 14.4 oz (108.364 kg) 234 lb (106.142 kg)    PHYSICAL EXAM Filed Vitals:   02/26/15 2023 02/26/15 2227 02/27/15 0535 02/27/15 0733  BP:  123/92 140/80   Pulse:  87 78   Temp:  97.4 F (36.3 C) 97.5 F (36.4 C)   TempSrc:  Oral Oral   Resp:  18 18   Height:      Weight:    234 lb (106.142 kg)   SpO2: 93% 98% 96% 94%   General: Alert, oriented x3, no distress Head: no evidence of trauma, PERRL, EOMI, no exophtalmos or lid lag, no myxedema, no xanthelasma; normal ears, nose and oropharynx Neck: normal jugular venous pulsations and no hepatojugular reflux; brisk carotid pulses without delay and no carotid bruits Chest: clear to auscultation, no signs of consolidation by percussion or palpation, normal fremitus, symmetrical and full respiratory excursions Cardiovascular: normal position and quality of the apical impulse, regular rhythm with ectopy, normal first and second heart sounds, no rubs or gallops, no murmur Abdomen: no tenderness or distention, no masses by palpation, no abnormal pulsatility or arterial bruits, normal bowel sounds, no hepatosplenomegaly Extremities: no clubbing, cyanosis or edema; 2+ radial, ulnar and brachial pulses bilaterally; 2+ right femoral, posterior tibial and dorsalis pedis pulses; 2+ left femoral, posterior tibial and dorsalis pedis pulses; no subclavian or femoral bruits Neurological: grossly nonfocal  LABS  CBC  Recent Labs  02/25/15 0208  WBC 7.8  HGB 13.8  HCT 41.5  MCV 82.7  PLT 169   Basic Metabolic Panel  Recent Labs  02/25/15 0208 02/26/15  0522  NA 136 137  K 3.9 4.1  CL 103 103  CO2 23 25  GLUCOSE 183* 159*  BUN 19 25*  CREATININE 0.85 1.01  CALCIUM 9.3 9.5   Liver Function Tests No results for input(s): AST, ALT, ALKPHOS, BILITOT, PROT, ALBUMIN in the last 72 hours. No results for input(s): LIPASE, AMYLASE in the last 72 hours. Cardiac Enzymes  Recent Labs  02/24/15 1435 02/24/15 2000 02/25/15 0208  TROPONINI 0.03 <0.03 <0.03   BNP Invalid input(s): POCBNP D-Dimer No results for input(s): DDIMER in the last 72 hours. Hemoglobin A1C No results for input(s): HGBA1C in the last 72 hours. Fasting Lipid Panel No results for input(s): CHOL, HDL, LDLCALC, TRIG, CHOLHDL, LDLDIRECT in the last  72 hours. Thyroid Function Tests  Recent Labs  02/24/15 1435  TSH 0.773    Radiology Studies Imaging results have been reviewed and No results found.  TELE SR with frequent PVCs   ASSESSMENT AND PLAN  Would reset his new target "dry weight" to 235 lb. Keep on current diuretic dose. Daily weights. Call Cardiology if weight exceeds 235 lb for 2 days in a row or if weight increases > 3lb/24h or 5lb/week.   Sanda Klein, MD, [facility] [phone] office [phone] pager 02/27/2015 9:17 AM

## 2015-02-27 NOTE — Discharge Instructions (Signed)
Heart Failure Heart failure means your heart has trouble pumping blood. This makes it hard for your body to work well. Heart failure is usually a long-term (chronic) condition. You must take good care of yourself and follow your doctor's treatment plan. HOME CARE  Take your heart medicine as told by your doctor.  Do not stop taking medicine unless your doctor tells you to.  Do not skip any dose of medicine.  Refill your medicines before they run out.  Take other medicines only as told by your doctor or pharmacist.  Stay active if told by your doctor. The elderly and people with severe heart failure should talk with a doctor about physical activity.  Eat heart-healthy foods. Choose foods that are without trans fat and are low in saturated fat, cholesterol, and salt (sodium). This includes fresh or frozen fruits and vegetables, fish, lean meats, fat-free or low-fat dairy foods, whole grains, and high-fiber foods. Lentils and dried peas and beans (legumes) are also good choices.  Limit salt if told by your doctor.  Cook in a healthy way. Roast, grill, broil, bake, poach, steam, or stir-fry foods.  Limit fluids as told by your doctor.  Weigh yourself every morning. Do this after you pee (urinate) and before you eat breakfast. Write down your weight to give to your doctor.  Take your blood pressure and write it down if your doctor tells you to.  Ask your doctor how to check your pulse. Check your pulse as told.  Lose weight if told by your doctor.  Stop smoking or chewing tobacco. Do not use gum or patches that help you quit without your doctor's approval.  Schedule and go to doctor visits as told.  Nonpregnant women should have no more than 1 drink a day. Men should have no more than 2 drinks a day. Talk to your doctor about drinking alcohol.  Stop illegal drug use.  Stay current with shots (immunizations).  Manage your health conditions as told by your doctor.  Learn to  manage your stress.  Rest when you are tired.  If it is really hot outside:  Avoid intense activities.  Use air conditioning or fans, or get in a cooler place.  Avoid caffeine and alcohol.  Wear loose-fitting, lightweight, and light-colored clothing.  If it is really cold outside:  Avoid intense activities.  Layer your clothing.  Wear mittens or gloves, a hat, and a scarf when going outside.  Avoid alcohol.  Learn about heart failure and get support as needed.  Get help to maintain or improve your quality of life and your ability to care for yourself as needed. GET HELP IF:   You gain 03 lb/1.4 kg or more in 1 day or 05 lb/2.3 kg in a week.  You are more short of breath than usual.  You cannot do your normal activities.  You tire easily.  You cough more than normal, especially with activity.  You have any or more puffiness (swelling) in areas such as your hands, feet, ankles, or belly (abdomen).  You cannot sleep because it is hard to breathe.  You feel like your heart is beating fast (palpitations).  You get dizzy or light-headed when you stand up. GET HELP RIGHT AWAY IF:   You have trouble breathing.  There is a change in mental status, such as becoming less alert or not being able to focus.  You have chest pain or discomfort.  You faint. MAKE SURE YOU:   Understand these instructions.  Will watch your condition.  Will get help right away if you are not doing well or get worse. Document Released: 03/19/2008 Document Revised: 10/25/2013 Document Reviewed: 07/27/2012 [facility] Patient Information 2015 [place], [place]. This information is not intended to replace advice given to you by your health care provider. Make sure you discuss any questions you have with your health care provider.  Pulmonary Edema Pulmonary edema (PE) is a condition in which fluid collects in the lungs. This makes it hard to breathe. PE may be a result of the heart not pumping  very well or a result of injury.  CAUSES   Coronary artery disease causes blockages in the arteries of the heart. This deprives the heart muscle of oxygen and weakens the muscle. A heart attack is a form of coronary artery disease.  High blood pressure causes the heart muscle to work harder than usual. Over time, the heart muscle may get stiff, and it starts to work less efficiently. It may also fatigue and weaken.  Viral infection of the heart (myocarditis) may weaken the heart muscle.  Metabolic conditions such as thyroid disease, excessive alcohol use, certain vitamin deficiencies, or diabetes may also weaken the heart muscle.  Leaky or stiff heart valves may impair normal heart function.  Lung disease may strain the heart muscle.  Excessive demands on the heart such as too much salt or fluid intake.  Failure to take prescribed medicines.  Lung injury from heat or toxins, such as poisonous gas.  Infection in the lungs or other parts of the body.  Fluid overload caused by kidney failure or medicines. SYMPTOMS   Shortness of breath at rest or with exertion.  Grunting, wheezing, or gurgling while breathing.  Feeling like you cannot get enough air.  Breaths are shallow and fast.  A lot of coughing with frothy or bloody mucus.  Skin may become cool, damp, and turn a pale or bluish color. DIAGNOSIS  Initial diagnosis may be based on your history, symptoms, and a physical examination. Additional tests for PE may include:  Electrocardiography.  Chest X-ray.  Blood tests.  Stress test.  Ultrasound evaluation of the heart (echocardiography).  Evaluation by a heart doctor (cardiologist).  Test of the heart arteries to look for blockages (angiography).  Check of blood oxygen. TREATMENT  Treatment of PE will depend on the underlying cause and will focus first on relieving the symptoms.   Extra oxygen to make breathing easier and assist with removing mucus. This may  include breathing treatments or a tube into the lungs and a breathing machine.  Medicine to help the body get rid of extra water, usually through an IV tube.  Medicine to help the heart pump better.  If poor heart function is the cause, treatment may include:  Procedures to open blocked arteries, repair damaged heart valves, or remove some of the damaged heart muscle.  A pacemaker to help the heart pump with less effort. HOME CARE INSTRUCTIONS   Your health care provider will help you determine what type of exercise program may be helpful. It is important to maintain strength and increase it if possible. Pace your activities to avoid shortness of breath or chest pain. Rest for at least 1 hour before and after meals. Cardiac rehabilitation programs are available in some locations.  Eat a heart-healthy diet low in salt, saturated fat, and cholesterol. Ask for help with choices.  Make a list of every medicine, vitamin, or herbal supplement you are taking. Keep the list  with you at all times. Show it to your health care provider at every visit and before starting a new medicine. Keep the list up to date.  Ask your health care provider or pharmacist to help you write a plan or schedule so that you know things about each medicine such as:  Why you are taking it.  The possible side effects.  The best time of day to take it.  Foods to take with it or avoid.  When to stop taking it.  Record your hospital or clinic weight. When you get home, compare it to your scale and record your weight. Then, weigh yourself first thing in the morning daily, and record the weights. You should weigh yourself every morning after you urinate and before you eat breakfast. Wear the same amount of clothing each time you weigh yourself. Provide your health care provider with your weight record. Daily weights are important in the early recognition of excess fluid. Tell your health care provider right away if you have  gained 03 lb/1.4 kg in 1 day, 05 lb/2.3 kg in a week, or as directed by your health care provider. Your medicines may need to be adjusted.  Blood pressure monitoring should be done as often as directed. You can get a home blood pressure cuff at your drugstore. Record these values and bring them with you for your clinic visits. Notify your health care provider if you become dizzy or light-headed when standing up.  If you are currently a smoker, it is time to quit. Nicotine makes your heart work harder and is one of the leading causes of cardiac deaths. Do not use nicotine gum or patches before talking to your doctor.  Make a follow-up appointment with your health care provider as directed.  Ask your health care provider for a copy of your latest heart tracing (ECG) and keep a copy with you at all times. SEEK IMMEDIATE MEDICAL CARE IF:   You have severe chest pain, especially if the pain is crushing or pressure-like and spreads to the arms, back, neck, or jaw. THIS IS AN EMERGENCY. Do not wait to see if the pain will go away. Call for local emergency medical help. Do not drive yourself to the hospital.  You have sweating, feel sick to your stomach (nauseous), or are experiencing shortness of breath.  Your weight increases by 03 lb/1.4 kg in 1 day or 05 lb/2.3 kg in a week.  You notice increasing shortness of breath that is unusual for you. This may happen during rest, sleep, or with activity.  You develop chest pain (angina) or pain that is unusual for you.  You notice more swelling in your hands, feet, ankles, or abdomen.  You notice lasting (persistent) dizziness, blurred vision, headache, or unsteadiness.  You begin to cough up bloody mucus (sputum).  You are unable to sleep because it is hard to breathe.  You begin to feel a "jumping" or "fluttering" sensation (palpitations) in the chest that is unusual for you. MAKE SURE YOU:  Understand these instructions.  Will watch your  condition.  Will get help right away if you are not doing well or get worse. Document Released: 08/31/2002 Document Revised: 06/15/2013 Document Reviewed: 02/15/2013 [facility] Patient Information 2015 [place], [place]. This information is not intended to replace advice given to you by your health care provider. Make sure you discuss any questions you have with your health care provider.

## 2015-02-28 ENCOUNTER — Encounter (HOSPITAL_COMMUNITY): Payer: Medicare Other

## 2015-03-01 LAB — CULTURE, BLOOD (ROUTINE X 2)
CULTURE: NO GROWTH
Culture: NO GROWTH

## 2015-03-02 ENCOUNTER — Encounter (HOSPITAL_COMMUNITY): Payer: Medicare Other

## 2015-03-03 ENCOUNTER — Encounter: Payer: Self-pay | Admitting: Internal Medicine

## 2015-03-03 LAB — CULTURE, RESPIRATORY

## 2015-03-03 LAB — CULTURE, RESPIRATORY W GRAM STAIN

## 2015-03-03 NOTE — Progress Notes (Unsigned)
Patient ID: [name], male   DOB: 09-06-1940, 74 y.o.   MRN: [id number]  Notified by the lab that the patient's sputum cultures grew Pseudomonas. The patient was discharged on 4 days of Levaquin after being treated with Rocephin/azithromycin in the hospital. Called Dr. Deitra Mayo office & spoke with Dr. Baldwin Crown RN (Dr. Letta Median off today) about the need to bring the patient in for close follow-up to assure that he does not need a more lengthy course of therapy with Levaquin given his culture results.  RAMA,CHRISTINA 03/03/2015 2:44 PM

## 2015-03-07 ENCOUNTER — Encounter (HOSPITAL_COMMUNITY): Payer: Medicare Other

## 2015-03-08 ENCOUNTER — Telehealth (HOSPITAL_COMMUNITY): Payer: Self-pay | Admitting: *Deleted

## 2015-03-08 NOTE — Telephone Encounter (Signed)
-----   Message from Lorretta Harp, MD sent at 03/06/2015  5:53 PM EDT ----- Regarding: RE: Ok to proceed with Cardiac Rehab (telemetry) Yes.  JJB ----- Message -----    From: Rowe Pavy, RN    Sent: 03/02/2015   7:15 AM      To: Sanda Klein, MD, Lorretta Harp, MD Subject: Madaline Brilliant to proceed with Cardiac Rehab (telemetry)    Pt referred to cardiac rehab after completing 2 pulmonary rehab sessions due to decreased EF.  Noted that pt had brief hospital stay recent due to heart failure, pt refuses ICD discharged on 9/4.  Pt scheduled for orientation on 9/15 and will begin exercise on 9/19.  Is patient appropriate to proceed with cardiac rehab?   Pt uses 2L O2 at night.  Will plan to monitor O2 sats during exercise. May we add O2 if sats drop below 90?  Noted on his walk test on room air O2 sats leveled at 90.   Thanks so much for your input [employer] RN

## 2015-03-09 ENCOUNTER — Encounter (HOSPITAL_COMMUNITY): Payer: Medicare Other

## 2015-03-09 ENCOUNTER — Encounter (HOSPITAL_COMMUNITY)
Admission: RE | Admit: 2015-03-09 | Discharge: 2015-03-09 | Disposition: A | Discharge status: Home / Self Care | Payer: Medicare Other | Source: Ambulatory Visit | Attending: Cardiovascular Disease | Admitting: Cardiovascular Disease

## 2015-03-09 NOTE — Progress Notes (Signed)
Pt showed today for orientation for cardiac rehab phase II s/p DES LAD.  Pt with recent hospitalization d/c on 9/13. Pt adamant regarding starting rehab citing multiple delays getting into the cardiac rehab program.  Pt spoke with Assistant Director - Ramon Dredge RN.  Will obtain medical clearance from Dr. Burt Knack for pt to proceed with exercise.  Pt has hospital follow up appointment on 9/21 with Melvyn Novas RN, BSN

## 2015-03-09 NOTE — Progress Notes (Signed)
Cardiac Rehab Medication Review by a Pharmacist  Does the patient  feel that his/her medications are working for him/her?  yes  Has the patient been experiencing any side effects to the medications prescribed?  no  Does the patient measure his/her own blood pressure or blood glucose at home?  yes repots 100-110/50-60  Does the patient have any problems obtaining medications due to transportation or finances?   no  Understanding of regimen: excellent Understanding of indications: excellent Potential of compliance: excellent    Pharmacist comments: Pt reports night time urination 6x/night.  Pt also reports difficulty sleeping. Per patient MD is aware of both of these conditions and is managing them.  Pt and caregiver report no access issues to medications.  Pt and caregiver show understanding and adherence to medication regimen.     Bennye Alm, PharmD Pharmacy Resident [phone]

## 2015-03-13 ENCOUNTER — Encounter: Payer: Self-pay | Admitting: Pulmonary Disease

## 2015-03-13 ENCOUNTER — Encounter (HOSPITAL_COMMUNITY)
Admission: RE | Admit: 2015-03-13 | Discharge: 2015-03-13 | Disposition: A | Discharge status: Home / Self Care | Payer: Medicare Other | Source: Ambulatory Visit | Attending: Cardiovascular Disease | Admitting: Cardiovascular Disease

## 2015-03-13 ENCOUNTER — Ambulatory Visit (INDEPENDENT_AMBULATORY_CARE_PROVIDER_SITE_OTHER): Payer: Medicare Other | Admitting: Pulmonary Disease

## 2015-03-13 ENCOUNTER — Ambulatory Visit (INDEPENDENT_AMBULATORY_CARE_PROVIDER_SITE_OTHER)
Admission: RE | Admit: 2015-03-13 | Discharge: 2015-03-13 | Disposition: A | Discharge status: Home / Self Care | Payer: Medicare Other | Source: Ambulatory Visit | Attending: Pulmonary Disease | Admitting: Pulmonary Disease

## 2015-03-13 ENCOUNTER — Encounter (HOSPITAL_COMMUNITY): Payer: Self-pay

## 2015-03-13 VITALS — BP 98/60 | HR 81 | Ht 70.0 in | Wt 241.6 lb

## 2015-03-13 DIAGNOSIS — D860 Sarcoidosis of lung: Secondary | ICD-10-CM | POA: Diagnosis not present

## 2015-03-13 DIAGNOSIS — J449 Chronic obstructive pulmonary disease, unspecified: Secondary | ICD-10-CM

## 2015-03-13 DIAGNOSIS — J151 Pneumonia due to Pseudomonas: Secondary | ICD-10-CM

## 2015-03-13 DIAGNOSIS — D869 Sarcoidosis, unspecified: Secondary | ICD-10-CM | POA: Diagnosis not present

## 2015-03-13 MED ORDER — BUDESONIDE-FORMOTEROL FUMARATE 160-4.5 MCG/ACT IN AERO: 2.0000 | INHALATION_SPRAY | Freq: Every day | RESPIRATORY_TRACT | Status: DC

## 2015-03-13 NOTE — Progress Notes (Signed)
Chief Complaint  Patient presents with  . Follow-up    pt states he is doing well. pt states his breathing is doing a whole lot better. pt using O2 on 2LPM at night.  pt wondering if he will have a CXR.  pt just started having a productive cough white in color for the past couple of days. pt is  currently in cardiac rehab. [place]     History of Present Illness: Travis Ferguson is a 74 y.o. male former smoker with COPD, and sarcoidosis.  He was in hospital recently for systolic/diastolic CHF, AECOPD, and pneumonia.  His sputum grew Pseudomonas that was pan sensitive.  He was sent home with levaquin and tapering dose of prednisone.  He has been feeling better.  He is monitoring his weight.  He still has a cough with clear sputum.  He is using his oxygen at night.  He does not have as much stamina.  He denies chest pain or fever.  He denies working leg swelling.  He is very concerned about getting into trouble with is breathing again.   TESTS: PSG May 2008>> AHI 8  Spirometry 11/18/08>>FEV1 2.15 (61%), FEV1% 62  Bronchoscopy 07/28/12 >> focal granulomatous inflammation Spirometry 03/14/14 >> FEV1 2.10 (60%), FEV1% 57 Echo 08/19/14 >> EF 50 to 94%, grade 1 diastolic dysfx Echo 4/96/75 >> EF 25 to 91%, grade 1 diastolic dysfx   CHEST IMAGING: CT chest 05/19/12>>superior segment LLL infiltrate, b/l hilar and mediastinal LAN with calcification CT chest 07/13/12 >> atherosclerosis, Lt hilar adenopathy, LLL superior segment narrowing, mass like opacity superior segment LLL 5.4 x 1.9 cm, multiple nodular areas LLL, diffuse GGO LLL PET scan 07/22/12 >> mildly hypermetabolic mediastinal adenopathy (SUV 5.8), superior segment nodules hypermetabolic (SUV 63.8) CT chest 08/31/12 >> decreased size of lesions CT chest 12/02/12 >> borderline mediastinal/hilar LAN, nodular septal thickening b/l, no change superior segment LLL, centrilobular emphysema CT chest 03/19/13 >> ?slight increase in LLL consolidative  change, borderline LAN CT chest 08/24/13 >> 1.9 cm Rt paratracheal LAN, calcified 2.1 cm subcarinal LAN, narrowing of mainstem bronchi, chronic infiltrate LLL CT chest 03/16/14 >> atherosclerosis, no change in mediastinal and b/l hilar LAN, no change in LLL mass like opacity, thickening of interstitium, scattered sub-pleural nodules, small Lt pleural effusion, gallstones CT chest 08/19/14 >> small b/l effusions, emphysema, no change LLL  PMHx >> Depression, PSTD, Migraines, combined CHF, CAD, HTN, HLD, Back pain, SDH 2010  PSHx, Medications, Allergies, Fhx, Shx reviewed.  Physical Exam: BP 98/60 mmHg  Pulse 81  Ht '5\' 10"'$  (1.778 m)  Wt 241 lb 9.6 oz (109.589 kg)  BMI 34.67 kg/m2  SpO2 96%  General - No distress ENT - no sinus tenderness, no oral exudate, no LAN Cardiac - s1s2 regular, no murmur Chest - prolonged exhalation, no wheeze/rales Back - No focal tenderness Abd - Soft, non-tender Ext - No edema Neuro - Normal strength Skin - No rashes Psych - normal mood, and behavior   Assessment/Plan:  COPD with chronic bronchitis. Plan: - change symbicort to 2 puffs qhs - continue prn proair  Recent episode of Pseudomonal pneumonia. Plan: - repeat CXR today  Nocturnal hypoxia. Related to COPD, pulmonary sarcoidosis, presumed OSA. Plan: - continue 2 liters oxygen at night  Pulmonary sarcoidosis. Recent CXR changes likely from pneumonia. Plan: - f/u CXR  Combined heart failure. Plan: - f/u with cardiology   Chesley Mires, MD [facility] Pager:  [phone] 03/13/2015, 3:40 PM

## 2015-03-13 NOTE — Patient Instructions (Signed)
Will arrange for chest xray  Change symbicort to two puffs at night time  Follow up in 3 months
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
## 2015-03-14 ENCOUNTER — Telehealth: Payer: Self-pay | Admitting: Pulmonary Disease

## 2015-03-14 ENCOUNTER — Encounter (HOSPITAL_COMMUNITY): Payer: Medicare Other

## 2015-03-14 NOTE — Telephone Encounter (Signed)
Dg Chest 2 View  03/14/2015   CLINICAL DATA:  Pneumonia due to Pseudomonas.  EXAM: CHEST  2 VIEW  COMPARISON:  February 24, 2015.  FINDINGS: Stable cardiomegaly. No pneumothorax or significant pleural effusion is noted. Multilevel degenerative disc disease is noted in the thoracic spine. Pulmonary edema and perihilar opacities noted on prior exam have significantly improved. Minimal right basilar opacity is noted most consistent with subsegmental atelectasis. Mild left basilar opacity is noted concerning for subsegmental atelectasis or possibly pneumonia.  IMPRESSION: Significantly improved bilateral lung opacities compared to prior exam. Minimal right basilar subsegmental atelectasis is noted, with mild residual left basilar opacity consistent with subsegmental atelectasis or residual pneumonia.   Electronically Signed   By: Marijo Conception, M.D.   On: 03/14/2015 08:33    Results d/w pt over phone.

## 2015-03-15 ENCOUNTER — Encounter (HOSPITAL_COMMUNITY)
Admission: RE | Admit: 2015-03-15 | Discharge: 2015-03-15 | Disposition: A | Discharge status: Home / Self Care | Payer: Medicare Other | Source: Ambulatory Visit | Attending: Cardiovascular Disease | Admitting: Cardiovascular Disease

## 2015-03-15 DIAGNOSIS — J449 Chronic obstructive pulmonary disease, unspecified: Secondary | ICD-10-CM | POA: Diagnosis not present

## 2015-03-16 ENCOUNTER — Encounter (HOSPITAL_COMMUNITY): Payer: Medicare Other

## 2015-03-17 ENCOUNTER — Encounter (HOSPITAL_COMMUNITY)
Admission: RE | Admit: 2015-03-17 | Discharge: 2015-03-17 | Disposition: A | Discharge status: Home / Self Care | Payer: Medicare Other | Source: Ambulatory Visit | Attending: Cardiovascular Disease | Admitting: Cardiovascular Disease

## 2015-03-17 DIAGNOSIS — J449 Chronic obstructive pulmonary disease, unspecified: Secondary | ICD-10-CM | POA: Diagnosis not present

## 2015-03-20 ENCOUNTER — Encounter (HOSPITAL_COMMUNITY)
Admission: RE | Admit: 2015-03-20 | Discharge: 2015-03-20 | Disposition: A | Discharge status: Home / Self Care | Payer: Medicare Other | Source: Ambulatory Visit | Attending: Cardiovascular Disease | Admitting: Cardiovascular Disease

## 2015-03-20 DIAGNOSIS — J449 Chronic obstructive pulmonary disease, unspecified: Secondary | ICD-10-CM | POA: Diagnosis not present

## 2015-03-21 ENCOUNTER — Encounter (HOSPITAL_COMMUNITY): Payer: Medicare Other

## 2015-03-22 ENCOUNTER — Encounter (HOSPITAL_COMMUNITY)
Admission: RE | Admit: 2015-03-22 | Discharge: 2015-03-22 | Disposition: A | Discharge status: Home / Self Care | Payer: Medicare Other | Source: Ambulatory Visit | Attending: Cardiovascular Disease | Admitting: Cardiovascular Disease

## 2015-03-22 DIAGNOSIS — J449 Chronic obstructive pulmonary disease, unspecified: Secondary | ICD-10-CM | POA: Diagnosis not present

## 2015-03-23 ENCOUNTER — Encounter (HOSPITAL_COMMUNITY): Payer: Medicare Other

## 2015-03-24 ENCOUNTER — Encounter (HOSPITAL_COMMUNITY): Payer: Medicare Other

## 2015-03-27 ENCOUNTER — Encounter (HOSPITAL_COMMUNITY)
Admission: RE | Admit: 2015-03-27 | Discharge: 2015-03-27 | Disposition: A | Discharge status: Home / Self Care | Payer: Medicare Other | Source: Ambulatory Visit | Attending: Cardiovascular Disease | Admitting: Cardiovascular Disease

## 2015-03-27 DIAGNOSIS — J449 Chronic obstructive pulmonary disease, unspecified: Secondary | ICD-10-CM | POA: Diagnosis present

## 2015-03-27 DIAGNOSIS — D869 Sarcoidosis, unspecified: Secondary | ICD-10-CM | POA: Insufficient documentation

## 2015-03-27 NOTE — Progress Notes (Addendum)
QUALITY OF LIFE SURVEY:  Pt completed Quality of Life survey as a participant in Cardiac Rehab. Scores 21.0 or below are considered low. Pt score very low in several areas Overall 20, Health and Function 13.  Pt scores were satisfactory in other categories.  Pt is discouraged with his overall medical history including his recent cardiac event. Pt is discouraged with the decline in his physical conditioning since he is a retired IT trainer and therefore led an active lifestyle with high level physical fitness.  Pt with history of PTSD verbalizes fear from the  dyspnea he experienced prior to his recent hospitalization.   Pt has worries and fears about his health and his mortality. Pt would like to be in better physical condition and better health. Pt has supportive family, has psychiatric support for his PTSD.  Pt offered emotional support and reassurance.  Will continue to monitor.

## 2015-03-27 NOTE — Progress Notes (Signed)
Pt had frequent PVC with couplets and triplets during exercise at cardiac rehab. Pt asymptomatic.  Rhythm strips faxed to Dr. Gwenlyn Found for review.

## 2015-03-28 ENCOUNTER — Encounter (HOSPITAL_COMMUNITY): Payer: Medicare Other

## 2015-03-29 ENCOUNTER — Encounter (HOSPITAL_COMMUNITY)
Admission: RE | Admit: 2015-03-29 | Discharge: 2015-03-29 | Disposition: A | Discharge status: Home / Self Care | Payer: Medicare Other | Source: Ambulatory Visit | Attending: Cardiovascular Disease | Admitting: Cardiovascular Disease

## 2015-03-29 DIAGNOSIS — J449 Chronic obstructive pulmonary disease, unspecified: Secondary | ICD-10-CM | POA: Diagnosis not present

## 2015-03-30 ENCOUNTER — Encounter (HOSPITAL_COMMUNITY): Payer: Medicare Other

## 2015-03-31 ENCOUNTER — Encounter (HOSPITAL_COMMUNITY): Payer: Medicare Other

## 2015-03-31 ENCOUNTER — Telehealth (HOSPITAL_COMMUNITY): Payer: Self-pay | Admitting: Cardiac Rehabilitation

## 2015-03-31 NOTE — Telephone Encounter (Signed)
Phone message received from pt he will be absent from cardiac rehab today due to death of family member.

## 2015-04-03 ENCOUNTER — Encounter (HOSPITAL_COMMUNITY)
Admission: RE | Admit: 2015-04-03 | Discharge: 2015-04-03 | Disposition: A | Discharge status: Home / Self Care | Payer: Medicare Other | Source: Ambulatory Visit | Attending: Cardiovascular Disease | Admitting: Cardiovascular Disease

## 2015-04-03 DIAGNOSIS — J449 Chronic obstructive pulmonary disease, unspecified: Secondary | ICD-10-CM | POA: Diagnosis not present

## 2015-04-04 ENCOUNTER — Encounter (HOSPITAL_COMMUNITY): Payer: Medicare Other

## 2015-04-05 ENCOUNTER — Telehealth (HOSPITAL_COMMUNITY): Payer: Self-pay | Admitting: Internal Medicine

## 2015-04-05 ENCOUNTER — Encounter (HOSPITAL_COMMUNITY): Admission: RE | Admit: 2015-04-05 | Payer: Medicare Other | Source: Ambulatory Visit

## 2015-04-06 ENCOUNTER — Encounter (HOSPITAL_COMMUNITY): Payer: Medicare Other

## 2015-04-07 ENCOUNTER — Encounter (HOSPITAL_COMMUNITY)
Admission: RE | Admit: 2015-04-07 | Discharge: 2015-04-07 | Disposition: A | Discharge status: Home / Self Care | Payer: Medicare Other | Source: Ambulatory Visit | Attending: Cardiovascular Disease | Admitting: Cardiovascular Disease

## 2015-04-07 DIAGNOSIS — J449 Chronic obstructive pulmonary disease, unspecified: Secondary | ICD-10-CM | POA: Diagnosis not present

## 2015-04-07 NOTE — Progress Notes (Signed)
Patient noted to have frequent PVC's and couplets. Patient does report having some left shoulder pain related to his rotator cuff otherwise patient asymptomatic. Blood pressure 114/60. [name] Oxygen saturation 95% on room air. Dr Kennon Holter office called and notified. Spoke with Elly Modena LPN. Will fax exercise flow sheets to Dr. Kennon Holter office for review with today's ECG tracings. Will continue to monitor the patient throughout  the program.

## 2015-04-10 ENCOUNTER — Encounter (HOSPITAL_COMMUNITY)
Admission: RE | Admit: 2015-04-10 | Discharge: 2015-04-10 | Disposition: A | Discharge status: Home / Self Care | Payer: Medicare Other | Source: Ambulatory Visit | Attending: Cardiovascular Disease | Admitting: Cardiovascular Disease

## 2015-04-10 DIAGNOSIS — J449 Chronic obstructive pulmonary disease, unspecified: Secondary | ICD-10-CM | POA: Diagnosis not present

## 2015-04-11 ENCOUNTER — Encounter (HOSPITAL_COMMUNITY): Payer: Medicare Other

## 2015-04-12 ENCOUNTER — Encounter (HOSPITAL_COMMUNITY)
Admission: RE | Admit: 2015-04-12 | Discharge: 2015-04-12 | Disposition: A | Discharge status: Home / Self Care | Payer: Medicare Other | Source: Ambulatory Visit | Attending: Cardiovascular Disease | Admitting: Cardiovascular Disease

## 2015-04-12 DIAGNOSIS — J449 Chronic obstructive pulmonary disease, unspecified: Secondary | ICD-10-CM | POA: Diagnosis not present

## 2015-04-12 NOTE — Progress Notes (Signed)
30 day psychosocial assessment  No psychosocial needs identified, no intervention necessary.pt does have history of PTSD which is currently being treated.  Pt is not exhibiting new symptoms from this. Pt continues to exhibit anger and frustration with his physical deconditioning and weakness.   Pt encouraged to exercise at home on off days from cardiac rehab to increase strength and stamina.  Will continue to monitor.

## 2015-04-13 ENCOUNTER — Encounter (HOSPITAL_COMMUNITY): Payer: Medicare Other

## 2015-04-14 ENCOUNTER — Encounter (HOSPITAL_COMMUNITY)
Admission: RE | Admit: 2015-04-14 | Discharge: 2015-04-14 | Disposition: A | Discharge status: Home / Self Care | Payer: Medicare Other | Source: Ambulatory Visit | Attending: Cardiovascular Disease | Admitting: Cardiovascular Disease

## 2015-04-14 DIAGNOSIS — J449 Chronic obstructive pulmonary disease, unspecified: Secondary | ICD-10-CM | POA: Diagnosis not present

## 2015-04-17 ENCOUNTER — Encounter: Payer: Self-pay | Admitting: Cardiovascular Disease

## 2015-04-17 ENCOUNTER — Encounter (HOSPITAL_COMMUNITY)
Admission: RE | Admit: 2015-04-17 | Discharge: 2015-04-17 | Disposition: A | Discharge status: Home / Self Care | Payer: Medicare Other | Source: Ambulatory Visit | Attending: Cardiovascular Disease | Admitting: Cardiovascular Disease

## 2015-04-17 DIAGNOSIS — J449 Chronic obstructive pulmonary disease, unspecified: Secondary | ICD-10-CM | POA: Diagnosis not present

## 2015-04-17 NOTE — Progress Notes (Signed)
Travis Ferguson 74 y.o. male Nutrition Note Spoke with pt. Nutrition Plan and Nutrition Survey goals reviewed with pt. Pt is not currently following the Therapeutic Lifestyle Changes diet. Pt reports trying to consume less healthy food in moderation. Barriers to making healthier food choices include pt suffers with chronic pain. Pt with dx of CHF. Per discussion, pt does not use canned/convenience foods often. Pt has decreased the frequency of eating out. Pt aware of the role of sodium in CHF and lung disease. Pt expressed understanding of the information reviewed. Pt aware of nutrition education classes offered.  No results found for: HGBA1C  Nutrition Diagnosis ? Food-and nutrition-related knowledge deficit related to lack of exposure to information as related to diagnosis of: ? CVD ? Obesity related to excessive energy intake as evidenced by a BMI of 34.1  Nutrition RX/ Estimated Daily Nutrition Needs for: wt loss 1600-2100 Kcal, 40-55 gm fat, 10-14 gm sat fat, 1.5-2.1 gm trans-fat, <1500 mg sodium  Nutrition Intervention ? Pt's individual nutrition plan reviewed with pt. ? Benefits of adopting Therapeutic Lifestyle Changes discussed when Medficts reviewed. ? Pt to attend the Portion Distortion class  ? Pt given handouts for: ? Nutrition I class ? Nutrition II class ? Continue client-centered nutrition education by RD, as part of interdisciplinary care. Goal(s) ? Pt to identify food quantities necessary to achieve: ? wt loss to a goal wt of 216-234 lb (98.4-106.6 kg) at graduation from cardiac rehab.  ? Pt to describe the benefit of including fruits, vegetables, whole grains, and low-fat dairy products in a heart healthy meal plan. Monitor and Evaluate progress toward nutrition goal with team. Nutrition Risk: Change to Moderate Derek Mound, M.Ed, RD, LDN, CDE 04/17/2015 2:08 PM

## 2015-04-18 ENCOUNTER — Encounter (HOSPITAL_COMMUNITY): Payer: Medicare Other

## 2015-04-19 ENCOUNTER — Other Ambulatory Visit: Payer: Self-pay | Admitting: Cardiovascular Disease

## 2015-04-19 ENCOUNTER — Ambulatory Visit (HOSPITAL_COMMUNITY): Payer: Medicare Other | Attending: Cardiology

## 2015-04-19 ENCOUNTER — Other Ambulatory Visit: Payer: Self-pay

## 2015-04-19 ENCOUNTER — Encounter (HOSPITAL_COMMUNITY): Payer: Medicare Other

## 2015-04-19 DIAGNOSIS — I517 Cardiomegaly: Secondary | ICD-10-CM | POA: Insufficient documentation

## 2015-04-19 DIAGNOSIS — E785 Hyperlipidemia, unspecified: Secondary | ICD-10-CM | POA: Insufficient documentation

## 2015-04-19 DIAGNOSIS — I5041 Acute combined systolic (congestive) and diastolic (congestive) heart failure: Secondary | ICD-10-CM

## 2015-04-19 DIAGNOSIS — I77810 Thoracic aortic ectasia: Secondary | ICD-10-CM | POA: Insufficient documentation

## 2015-04-19 DIAGNOSIS — R29898 Other symptoms and signs involving the musculoskeletal system: Secondary | ICD-10-CM | POA: Diagnosis not present

## 2015-04-19 DIAGNOSIS — Z87891 Personal history of nicotine dependence: Secondary | ICD-10-CM | POA: Diagnosis not present

## 2015-04-19 DIAGNOSIS — I429 Cardiomyopathy, unspecified: Secondary | ICD-10-CM | POA: Insufficient documentation

## 2015-04-19 DIAGNOSIS — I10 Essential (primary) hypertension: Secondary | ICD-10-CM | POA: Insufficient documentation

## 2015-04-20 ENCOUNTER — Telehealth: Payer: Self-pay | Admitting: Pulmonary Disease

## 2015-04-20 ENCOUNTER — Encounter (HOSPITAL_COMMUNITY): Payer: Medicare Other

## 2015-04-20 NOTE — Telephone Encounter (Signed)
Spoke with pt and given appt on 06/01/15 at 1:30.

## 2015-04-20 NOTE — Telephone Encounter (Signed)
Okay to double/triple book visit.

## 2015-04-20 NOTE — Telephone Encounter (Signed)
Spoke with Travis Ferguson. He is needing appt to see Dr. Halford Chessman in December. He only wants to see Dr. Halford Chessman and wants to be worked in. He reports he called 3 times and was told December schedule was not out and then when he calls today, Dr. Halford Chessman is booked up.  Please advise Dr. Halford Chessman thanks

## 2015-04-21 ENCOUNTER — Encounter (HOSPITAL_COMMUNITY)
Admission: RE | Admit: 2015-04-21 | Discharge: 2015-04-21 | Disposition: A | Discharge status: Home / Self Care | Payer: Medicare Other | Source: Ambulatory Visit | Attending: Cardiovascular Disease | Admitting: Cardiovascular Disease

## 2015-04-21 DIAGNOSIS — J449 Chronic obstructive pulmonary disease, unspecified: Secondary | ICD-10-CM | POA: Diagnosis not present

## 2015-04-24 ENCOUNTER — Encounter (HOSPITAL_COMMUNITY)
Admission: RE | Admit: 2015-04-24 | Discharge: 2015-04-24 | Disposition: A | Discharge status: Home / Self Care | Payer: Medicare Other | Source: Ambulatory Visit | Attending: Cardiovascular Disease | Admitting: Cardiovascular Disease

## 2015-04-24 DIAGNOSIS — J449 Chronic obstructive pulmonary disease, unspecified: Secondary | ICD-10-CM | POA: Diagnosis not present

## 2015-04-25 ENCOUNTER — Encounter (HOSPITAL_COMMUNITY): Payer: Medicare Other

## 2015-04-26 ENCOUNTER — Encounter (HOSPITAL_COMMUNITY)
Admission: RE | Admit: 2015-04-26 | Discharge: 2015-04-26 | Disposition: A | Discharge status: Home / Self Care | Payer: Medicare Other | Source: Ambulatory Visit | Attending: Cardiovascular Disease | Admitting: Cardiovascular Disease

## 2015-04-26 DIAGNOSIS — J449 Chronic obstructive pulmonary disease, unspecified: Secondary | ICD-10-CM | POA: Diagnosis not present

## 2015-04-26 DIAGNOSIS — D869 Sarcoidosis, unspecified: Secondary | ICD-10-CM | POA: Insufficient documentation

## 2015-04-27 ENCOUNTER — Encounter (HOSPITAL_COMMUNITY): Payer: Medicare Other

## 2015-04-28 ENCOUNTER — Encounter (HOSPITAL_COMMUNITY)
Admission: RE | Admit: 2015-04-28 | Discharge: 2015-04-28 | Disposition: A | Discharge status: Home / Self Care | Payer: Medicare Other | Source: Ambulatory Visit | Attending: Cardiovascular Disease | Admitting: Cardiovascular Disease

## 2015-04-28 DIAGNOSIS — J449 Chronic obstructive pulmonary disease, unspecified: Secondary | ICD-10-CM | POA: Diagnosis not present

## 2015-05-01 ENCOUNTER — Encounter (HOSPITAL_COMMUNITY)
Admission: RE | Admit: 2015-05-01 | Discharge: 2015-05-01 | Disposition: A | Discharge status: Home / Self Care | Payer: Medicare Other | Source: Ambulatory Visit | Attending: Cardiovascular Disease | Admitting: Cardiovascular Disease

## 2015-05-01 DIAGNOSIS — J449 Chronic obstructive pulmonary disease, unspecified: Secondary | ICD-10-CM | POA: Diagnosis not present

## 2015-05-02 ENCOUNTER — Encounter (HOSPITAL_COMMUNITY): Payer: Medicare Other

## 2015-05-03 ENCOUNTER — Encounter (HOSPITAL_COMMUNITY): Payer: Medicare Other

## 2015-05-03 ENCOUNTER — Telehealth (HOSPITAL_COMMUNITY): Payer: Self-pay | Admitting: Internal Medicine

## 2015-05-05 ENCOUNTER — Telehealth (HOSPITAL_COMMUNITY): Payer: Self-pay | Admitting: Cardiac Rehabilitation

## 2015-05-05 ENCOUNTER — Encounter (HOSPITAL_COMMUNITY)
Admission: RE | Admit: 2015-05-05 | Discharge: 2015-05-05 | Disposition: A | Discharge status: Home / Self Care | Payer: Medicare Other | Source: Ambulatory Visit | Attending: Cardiovascular Disease | Admitting: Cardiovascular Disease

## 2015-05-05 DIAGNOSIS — J449 Chronic obstructive pulmonary disease, unspecified: Secondary | ICD-10-CM | POA: Diagnosis not present

## 2015-05-05 NOTE — Telephone Encounter (Signed)
At pt request phone call to [place] to verify benefits for cardiac rehab services.  Pt plan effective since 01/22/14.  Pt has no deductible.  Pt has $3200 OOP max which has been met for 2016 calender year.  Therefore cardiac rehab services 100% until 06/24/15.  Pt made aware. Understanding verbalized.

## 2015-05-08 ENCOUNTER — Encounter (HOSPITAL_COMMUNITY)
Admission: RE | Admit: 2015-05-08 | Discharge: 2015-05-08 | Disposition: A | Discharge status: Home / Self Care | Payer: Medicare Other | Source: Ambulatory Visit | Attending: Cardiovascular Disease | Admitting: Cardiovascular Disease

## 2015-05-08 DIAGNOSIS — J449 Chronic obstructive pulmonary disease, unspecified: Secondary | ICD-10-CM | POA: Diagnosis not present

## 2015-05-10 ENCOUNTER — Encounter (HOSPITAL_COMMUNITY)
Admission: RE | Admit: 2015-05-10 | Discharge: 2015-05-10 | Disposition: A | Discharge status: Home / Self Care | Payer: Medicare Other | Source: Ambulatory Visit | Attending: Cardiovascular Disease | Admitting: Cardiovascular Disease

## 2015-05-10 DIAGNOSIS — J449 Chronic obstructive pulmonary disease, unspecified: Secondary | ICD-10-CM | POA: Diagnosis not present

## 2015-05-12 ENCOUNTER — Ambulatory Visit (INDEPENDENT_AMBULATORY_CARE_PROVIDER_SITE_OTHER): Payer: Medicare Other | Admitting: Cardiovascular Disease

## 2015-05-12 ENCOUNTER — Telehealth: Payer: Self-pay

## 2015-05-12 ENCOUNTER — Encounter (HOSPITAL_COMMUNITY)
Admission: RE | Admit: 2015-05-12 | Discharge: 2015-05-12 | Disposition: A | Discharge status: Home / Self Care | Payer: Medicare Other | Source: Ambulatory Visit | Attending: Cardiovascular Disease | Admitting: Cardiovascular Disease

## 2015-05-12 ENCOUNTER — Encounter: Payer: Self-pay | Admitting: Cardiovascular Disease

## 2015-05-12 DIAGNOSIS — I519 Heart disease, unspecified: Secondary | ICD-10-CM

## 2015-05-12 DIAGNOSIS — E785 Hyperlipidemia, unspecified: Secondary | ICD-10-CM

## 2015-05-12 DIAGNOSIS — J449 Chronic obstructive pulmonary disease, unspecified: Secondary | ICD-10-CM | POA: Diagnosis not present

## 2015-05-12 MED ORDER — SACUBITRIL-VALSARTAN 24-26 MG PO TABS: 1.0000 | ORAL_TABLET | Freq: Two times a day (BID) | ORAL | Status: DC

## 2015-05-12 MED ORDER — FUROSEMIDE 20 MG PO TABS: 20.0000 mg | ORAL_TABLET | Freq: Every day | ORAL | Status: DC

## 2015-05-12 NOTE — Patient Instructions (Addendum)
Medication Instructions:  Your physician has recommended you make the following change in your medication:  1) STOP Losartan 2) DECREASE Lasix to 20 mg by mouth ONCE daily  3) START ENTRESTO 24/26 by mouth TWICE daily  IF YOU BECOME DIZZIY OR INCREASED WEIGHT LOSS (GREATER THAN 3 LBS OVER NIGHT OR 5 LBS IN A WEEK) STOP ALDACTONE.   Labwork: none  Testing/Procedures: none  Follow-Up: Your physician recommends that you schedule a follow-up appointment in: 2 weeks with Erasmo Downer - BP clinic  We request that you follow-up in: 3 months with an extender and in 6 months with Dr Andria Rhein will receive a reminder letter in the mail two months in advance. If you don't receive a letter, please call our office to schedule the follow-up appointment.    Any Other Special Instructions Will Be Listed Below (If Applicable).     If you need a refill on your cardiac medications before your next appointment, please call your pharmacy.

## 2015-05-12 NOTE — Assessment & Plan Note (Signed)
History of hyperlipidemia on simvastatin followed by his PCP

## 2015-05-12 NOTE — Telephone Encounter (Signed)
Opened in error

## 2015-05-12 NOTE — Telephone Encounter (Signed)
Prior auth for [place] 24-26 sent to [facility].

## 2015-05-12 NOTE — Assessment & Plan Note (Signed)
History of hypertension blood pressure measured to 94/66 on losartan and metoprolol as well as spironolactone. Continue current meds at current dosing

## 2015-05-12 NOTE — Progress Notes (Signed)
05/12/2015 Travis Ferguson   Jul 19, 1940  [id number]  Primary Physician Jerlyn Ly, MD Primary Cardiologist: Lorretta Harp MD Renae Gloss   HPI:  The patient is a very pleasant 74 year old moderately overweight married Caucasian male, father of 54, grandfather to 1 grandchild, who is a retired Catering manager. He is referred through the courtesy of Dr. Joylene Draft for cardiovascular evaluation because of chronic dyspnea and exertional chest pressure. I last saw him in the office a/24/16.  His cardiac risk factor profile is remarkable for remote discontinued tobacco abuse back in 1996. He smoked a pipe at that time. He has treated hypertension and hyperlipidemia. He does have a family history of heart disease with a brother who had bypass surgery in his mid 108s. He has never had a heart attack or stroke. His past surgical history is remarkable for subdural hematoma and brain surgery in the past. He has been worked up by Dr. Halford Chessman for question of lung CA, which has never been clinically documented. He did have a chest CT that showed, among other things, plaque and atherosclerosis in his coronary arteries.   The patient recently had a 2-D echocardiogram and Myoview stress test which showed normal left for function and no evidence of ischemia. He did have grade 1 diastolic dysfunction. He denies chest pain. 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  He had cholecystectomy in early May for gangrenous gallbladder with prolonged hospitalization. He was recently noted to be more short of breath in usual and a BNP was ordered that was in the 500 range. He was treated with oral diuretics which resulted in marked improvement in his symptoms.  A follow-up 2-D echocardiogram showed an EF of 20 or 25% with severe global hypokinesia as well as segmental wall motion abnormalities.  Because of the decline in his ejection fraction he underwent right and left heart cath by myself 01/23/15 revealing minimal CAD, elevated filling pressures and severe LV dysfunction. I offered him the option of having an ICD placed for primary prevention and referred him to Dr. Erline Hau he has happily decided not to pursue this. He is participating in cardiac rehabilitation.his most recent 2-D echo performed 04/19/15 revealed persistent LV dysfunction with an EF in the 30% range despite optimal medical therapy.   Current Outpatient Prescriptions  Medication Sig Dispense Refill  . albuterol (PROVENTIL) (2.5 MG/3ML) 0.083% nebulizer solution Take 3 mLs (2.5 mg total) by nebulization every 4 (four) hours as needed for wheezing or shortness of breath. 75 mL 12  . budesonide-formoterol (SYMBICORT) 160-4.5 MCG/ACT inhaler Inhale 2 puffs into the lungs at bedtime. 2 Inhaler 0  . finasteride (PROSCAR) 5 MG tablet Take 5 mg by mouth daily.     [name] FLUoxetine (PROZAC) 40 MG capsule Take 40 mg by mouth daily with breakfast.     . furosemide (LASIX) 20 MG tablet Take 1 tablet (20 mg total) by mouth daily. 30 tablet 4  . HYDROcodone-acetaminophen (NORCO/VICODIN) 5-325 MG per tablet Take 2 tablets by mouth 2 (two) times daily as needed (for pain). 30 tablet 0  . metoprolol tartrate (LOPRESSOR) 25 MG tablet Take 1 tablet (25 mg total) by mouth 2 (two) times daily. 90 tablet 3  . Multiple Vitamin (MULTIVITAMIN) tablet Take 1 tablet by mouth daily.    . OXYGEN Inhale 2 L/min into the lungs at bedtime.    [name]  PROAIR HFA 108 (90 BASE) MCG/ACT inhaler 1-2 PUFFS EVERY 4-6 HOURS AS NEEDED (Patient taking differently: 1-2 PUFFS EVERY 4-6 HOURS AS NEEDED for shortness of breath) 8.5 Inhaler 1  . simvastatin (ZOCOR) 20 MG tablet Take 1 tablet by mouth every evening.     . sodium chloride (OCEAN) 0.65 % SOLN nasal spray Place 1-2 sprays into both nostrils daily as needed for congestion.    [name] spironolactone (ALDACTONE) 25 MG  tablet Take 1 tablet (25 mg total) by mouth daily. 30 tablet 4  . traZODone (DESYREL) 100 MG tablet Take 1 tablet (100 mg total) by mouth at bedtime. 15 tablet 0   No current facility-administered medications for this visit.    Allergies  Allergen Reactions  . Statins Other (See Comments)    Muscle aches - tolerating Vytorin    Social History   Social History  . Marital Status: Married    Spouse Name: N/A  . Number of Children: N/A  . Years of Education: N/A   Occupational History  . retired Airline pilot in [place]  . Smoking status: Former Smoker -- 0.00 packs/day for 30 years    Types: Pipe    Quit date: 06/24/2008  . Smokeless tobacco: Never Used  . Alcohol Use: No  . Drug Use: No  . Sexual Activity: Not on file   Other Topics Concern  . Not on file   Social History Narrative     Review of Systems: General: negative for chills, fever, night sweats or weight changes.  Cardiovascular: negative for chest pain, dyspnea on exertion, edema, orthopnea, palpitations, paroxysmal nocturnal dyspnea or shortness of breath Dermatological: negative for rash Respiratory: negative for cough or wheezing Urologic: negative for hematuria Abdominal: negative for nausea, vomiting, diarrhea, bright red blood per rectum, melena, or hematemesis Neurologic: negative for visual changes, syncope, or dizziness All other systems reviewed and are otherwise negative except as noted above.    Blood pressure 94/66, pulse 74, height 6' (1.829 m), weight 245 lb (111.131 kg).  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 I'm going to transition him to [place].currently he appears euvolemic.  Hyperlipidemia History of hyperlipidemia on simvastatin followed by his PCP  Essential hypertension History of hypertension blood pressure measured to 94/66 on losartan and metoprolol as well as spironolactone. Continue current meds at current dosing  Coronary artery calcification seen on CAT scan History of minimal CAD at cardiac catheterization performed by myself performed 01/23/15      Lorretta Harp MD [facility], [facility] 05/12/2015 11:31 AM

## 2015-05-12 NOTE — Assessment & Plan Note (Signed)
history of nonischemic cardio myopathy with recent 2-D echo performed 04/19/15 revealing an ejection fraction of 30-35% which has not changed despite optimal medical therapy.he has declined ICD therapy for primary prevention. I'm going to transition him to [place].currently he appears euvolemic.

## 2015-05-12 NOTE — Assessment & Plan Note (Signed)
History of minimal CAD at cardiac catheterization performed by myself performed 01/23/15

## 2015-05-15 ENCOUNTER — Encounter (HOSPITAL_COMMUNITY): Payer: Medicare Other

## 2015-05-15 ENCOUNTER — Telehealth: Payer: Self-pay | Admitting: Cardiovascular Disease

## 2015-05-15 NOTE — Telephone Encounter (Signed)
I sent a fax back with one clinical question that was not previously answered. Also sent clinical notes. Denial letter received approx 30-40 minutes later. I have sent an expidited appeal just now.

## 2015-05-15 NOTE — Telephone Encounter (Signed)
[place]([facility]) is calling about a prior [place] for [employer] . Have some questions . Request may be denied today if they do not hear back. #reference [id number]. Please call   Thanks

## 2015-05-15 NOTE — Telephone Encounter (Signed)
This PA request came through urgently and I took responsibility to handle as triage RN. Will forward to [facility] LPN for follow up.  Called [facility] and gave reference number. Was informed after approx 10 mins on hold that prior authorization had already been denied due to insufficient information.  Pt is a medicare recipient and per their protocol we had 72 hrs (not 3 business days) to give supplemental information regarding diagnoses, meds already tried for condition, etc. Med was prescribed on Friday and denied Monday AM at some point between time of their call-in and my return call (I spoke to rep approx 11:30am) The representative I spoke to was not able to assist me in elevating this to higher level for attention or opening appeal process. She stated she lacked the credentials to be able to reverse the denial w/o appeal.  I expressed my concern that the oversight in this PA & denial would cause undue harm to patient due to not being able to get his medication. The representative put me through to VM for an administrator after stating an attempt to call for managerial support.  I left message requesting call and attention on this. The phone number provided to call back concerning this is [phone].

## 2015-05-16 ENCOUNTER — Other Ambulatory Visit: Payer: Self-pay | Admitting: Pulmonary Disease

## 2015-05-16 ENCOUNTER — Telehealth: Payer: Self-pay | Admitting: Cardiovascular Disease

## 2015-05-16 ENCOUNTER — Telehealth: Payer: Self-pay

## 2015-05-16 NOTE — Telephone Encounter (Signed)
Entresto approved through 05/14/2016. [place] #[id number].

## 2015-05-16 NOTE — Progress Notes (Signed)
(  late entry for 05/15/15)  60 day psychosocial assessement:  Pt with history of depression and PTSD. Pt mood is lighter with less anxiety.  Pt appears to enjoying rehab and is requesting to continue program beyond his scheduled 36 visits.  Pt reports he is able to do more activity at home than prior to beginning program.  Will continue to monitor.

## 2015-05-16 NOTE — Telephone Encounter (Signed)
Attempted to call. No voicemail set up yet.

## 2015-05-16 NOTE — Telephone Encounter (Signed)
New message      Calling to let you know that entresto has been approved

## 2015-05-17 ENCOUNTER — Encounter (HOSPITAL_COMMUNITY)
Admission: RE | Admit: 2015-05-17 | Discharge: 2015-05-17 | Disposition: A | Discharge status: Home / Self Care | Payer: Medicare Other | Source: Ambulatory Visit | Attending: Cardiovascular Disease | Admitting: Cardiovascular Disease

## 2015-05-17 DIAGNOSIS — J449 Chronic obstructive pulmonary disease, unspecified: Secondary | ICD-10-CM | POA: Diagnosis not present

## 2015-05-17 NOTE — Progress Notes (Signed)
Pt arrived at cardiac rehab reporting medication change DC losartan, change lasix to '20mg'$  daily and add entresto 24/'26mg'$  BID.  Pt started this on Monday. Medication list updated.

## 2015-05-22 ENCOUNTER — Encounter (HOSPITAL_COMMUNITY)
Admission: RE | Admit: 2015-05-22 | Discharge: 2015-05-22 | Disposition: A | Discharge status: Home / Self Care | Payer: Medicare Other | Source: Ambulatory Visit | Attending: Cardiovascular Disease | Admitting: Cardiovascular Disease

## 2015-05-22 DIAGNOSIS — J449 Chronic obstructive pulmonary disease, unspecified: Secondary | ICD-10-CM | POA: Diagnosis not present

## 2015-05-24 ENCOUNTER — Encounter (HOSPITAL_COMMUNITY)
Admission: RE | Admit: 2015-05-24 | Discharge: 2015-05-24 | Disposition: A | Discharge status: Home / Self Care | Payer: Medicare Other | Source: Ambulatory Visit | Attending: Cardiovascular Disease | Admitting: Cardiovascular Disease

## 2015-05-24 DIAGNOSIS — J449 Chronic obstructive pulmonary disease, unspecified: Secondary | ICD-10-CM | POA: Diagnosis not present

## 2015-05-25 ENCOUNTER — Telehealth: Payer: Self-pay | Admitting: Pulmonary Disease

## 2015-05-25 DIAGNOSIS — J438 Other emphysema: Secondary | ICD-10-CM

## 2015-05-25 NOTE — Telephone Encounter (Signed)
Order has been placed. Called spoke with spouse and she is aware. Nothing further needed

## 2015-05-25 NOTE — Telephone Encounter (Signed)
Okay to set him up with extension for pulmonary rehab.

## 2015-05-25 NOTE — Telephone Encounter (Signed)
Called and spoke with pt Pt states that he feels that pulmonary rehab has helped his breathing Pt states that he was told that rehab would be ending soon and need [facility] to authorize an extension on it Informed pt that i would send a message to [facility] to see if it is ok to extend rehab  Dr Halford Chessman, please advise. Thanks

## 2015-05-26 ENCOUNTER — Encounter (HOSPITAL_COMMUNITY)
Admission: RE | Admit: 2015-05-26 | Discharge: 2015-05-26 | Disposition: A | Discharge status: Home / Self Care | Payer: Medicare Other | Source: Ambulatory Visit | Attending: Cardiovascular Disease | Admitting: Cardiovascular Disease

## 2015-05-26 DIAGNOSIS — J449 Chronic obstructive pulmonary disease, unspecified: Secondary | ICD-10-CM | POA: Diagnosis present

## 2015-05-26 DIAGNOSIS — D869 Sarcoidosis, unspecified: Secondary | ICD-10-CM | POA: Diagnosis not present

## 2015-05-29 ENCOUNTER — Ambulatory Visit (INDEPENDENT_AMBULATORY_CARE_PROVIDER_SITE_OTHER): Payer: Medicare Other | Admitting: Pharmacist Clinician (PhC)/ Clinical Pharmacy Specialist

## 2015-05-29 ENCOUNTER — Telehealth (HOSPITAL_COMMUNITY): Payer: Self-pay | Admitting: Internal Medicine

## 2015-05-29 ENCOUNTER — Encounter: Payer: Self-pay | Admitting: Pharmacist Clinician (PhC)/ Clinical Pharmacy Specialist

## 2015-05-29 ENCOUNTER — Other Ambulatory Visit: Payer: Self-pay | Admitting: Cardiovascular Disease

## 2015-05-29 ENCOUNTER — Encounter (HOSPITAL_COMMUNITY): Payer: Medicare Other

## 2015-05-29 VITALS — BP 96/68 | HR 76 | Ht 72.0 in | Wt 249.1 lb

## 2015-05-29 DIAGNOSIS — I10 Essential (primary) hypertension: Secondary | ICD-10-CM

## 2015-05-29 NOTE — Patient Instructions (Signed)
Your blood pressure today is 96/68  Check your blood pressure and weight at home daily keep record of the readings.  Take your BP meds as follows: continue with current medications  Bring all of your meds, your BP cuff and your record of home blood pressures to your next appointment.  Exercise as you're able, try to walk approximately 30 minutes per day.  Keep salt intake to a minimum, especially watch canned and prepared boxed foods.  Eat more fresh fruits and vegetables and fewer canned items.  Avoid eating in fast food restaurants.    HOW TO TAKE YOUR BLOOD PRESSURE: . Rest 5 minutes before taking your blood pressure. .  Don't smoke or drink caffeinated beverages for at least 30 minutes before. . Take your blood pressure before (not after) you eat. . Sit comfortably with your back supported and both feet on the floor (don't cross your legs). . Elevate your arm to heart level on a table or a desk. . Use the proper sized cuff. It should fit smoothly and snugly around your bare upper arm. There should be enough room to slip a fingertip under the cuff. The bottom edge of the cuff should be 1 inch above the crease of the elbow. . Ideally, take 3 measurements at one sitting and record the average.

## 2015-05-29 NOTE — Progress Notes (Signed)
05/29/2015 Travis Ferguson 1940-08-02 [id number]   HPI:  Travis Ferguson is a 74 y.o. male patient of Travis Ferguson, with a PMH below who presents today for hypertension clinic evaluation and possible medication titration.  His losartan was discontinued at his visit with Travis. Gwenlyn Ferguson on Nov 18 and he was started on Entresto 24/26 twice daily for [facility] with an EF of 30-35%.  His BP at that visit was 94/66.  His furosemide was cut on the same day from 40 mg to 20 mg.  Since then he reports his weight has been stable within 5 pounds.    Cardiac Hx:  LVHF, EF 30-35%, hyperlipidemia, hypertension  Social Hx: no tobacco or alcohol, drinks coffee and soda, usually 1 each per day  Diet: no added salt, has done dietary review at cardiac rehab  Exercise: cardiac rehab three times weekly  Home BP readings: 92-125/46-74.  Patient reports no dizziness, lightheadedness or "head rush" feelings.  Current antihypertensive medications: Entresto 24/26 bid, spironolactone 25 mg qd, furosemide 20 mg qd   Wt Readings from Last 3 Encounters:  05/29/15 249 lb 1.6 oz (112.991 kg)  05/12/15 245 lb (111.131 kg)  03/13/15 241 lb 9.6 oz (109.589 kg)   BP Readings from Last 3 Encounters:  05/29/15 96/68  05/12/15 94/66  03/13/15 98/60   Pulse Readings from Last 3 Encounters:  05/29/15 76  05/12/15 74  03/13/15 81    Current Outpatient Prescriptions  Medication Sig Dispense Refill  . albuterol (PROVENTIL) (2.5 MG/3ML) 0.083% nebulizer solution Take 3 mLs (2.5 mg total) by nebulization every 4 (four) hours as needed for wheezing or shortness of breath. 75 mL 12  . budesonide-formoterol (SYMBICORT) 160-4.5 MCG/ACT inhaler Inhale 2 puffs into the lungs at bedtime. 2 Inhaler 0  . finasteride (PROSCAR) 5 MG tablet Take 5 mg by mouth daily.     [name] FLUoxetine (PROZAC) 40 MG capsule Take 40 mg by mouth daily with breakfast.     . furosemide (LASIX) 20 MG tablet Take 1 tablet (20 mg total) by mouth daily. 30 tablet 4    . HYDROcodone-acetaminophen (NORCO/VICODIN) 5-325 MG per tablet Take 2 tablets by mouth 2 (two) times daily as needed (for pain). 30 tablet 0  . metoprolol tartrate (LOPRESSOR) 25 MG tablet Take 1 tablet (25 mg total) by mouth 2 (two) times daily. 90 tablet 3  . Multiple Vitamin (MULTIVITAMIN) tablet Take 1 tablet by mouth daily.    . OXYGEN Inhale 2 L/min into the lungs at bedtime.    [name] PROAIR HFA 108 (90 BASE) MCG/ACT inhaler 1-2 PUFFS EVERY 4-6 HOURS AS NEEDED (Patient taking differently: 1-2 PUFFS EVERY 4-6 HOURS AS NEEDED for shortness of breath) 8.5 Inhaler 1  . sacubitril-valsartan (ENTRESTO) 24-26 MG Take 1 tablet by mouth 2 (two) times daily. 60 tablet 3  . simvastatin (ZOCOR) 20 MG tablet Take 1 tablet by mouth every evening.     . sodium chloride (OCEAN) 0.65 % SOLN nasal spray Place 1-2 sprays into both nostrils daily as needed for congestion.    [name] spironolactone (ALDACTONE) 25 MG tablet Take 1 tablet (25 mg total) by mouth daily. 30 tablet 4  . traZODone (DESYREL) 100 MG tablet Take 1 tablet (100 mg total) by mouth at bedtime. 15 tablet 0   No current facility-administered medications for this visit.    Allergies  Allergen Reactions  . Statins Other (See Comments)    Muscle aches - tolerating Vytorin    Past  Medical History  Diagnosis Date  . COPD (chronic obstructive pulmonary disease) ([place])     a. Former Airline pilot, also smoked a pipe.  . Depression   . PTSD (post-traumatic stress disorder)   . Migraine   . GERD (gastroesophageal reflux disease)   . Dyslipidemia   . Rhinitis   . Cervical disc disease   . Subdural hematoma ([place]) 11/10    a. 2010 s/p surgery - diagnosed several weeks after a fall.  . Hypertension   . Coronary artery calcification seen on CAT scan   . Sarcoidosis ([place])   . Atherosclerosis   . Dyspnea     chronic  . Back pain     chronic back pain/under pain management  . Inguinal hernia     right side  . Colon polyps     adenomatous and  hyperplastic  . Sepsis ([place])   . Gallstones   . Sensation problem     right side-lateral hip to knee" decreased sensation and tingling feeling" -has informed Travis. Joylene Draft, Travis. Henrene Pastor, Travis. Lucia Gaskins of this.  . Encounter for biliary drainage tube placement     remains with drainage bag to right side for  biliary drainage.  . Sinus congestion     10-27-14 at present some issues-"not bad"  . History of oxygen administration     @ 2 l/m nasally at bedtime.  . Hiatal hernia     sensation problem ("right lateral side hip to knee").  . OSA (obstructive sleep apnea)     does not use CPAP  . Pneumonia     age 46 or 72   . Anxiety   . Frequent urination   . Arthritis   . Chronic bronchitis ([place])   . Colon polyps 08/02/2014    Tubular adenoma x 3, Hyperplastic-1  . Diastolic dysfunction     grade 1 diastolic dysfunction on 2-D echo  . Left ventricular dysfunction     ejection fraction of 25-30% with regional wall motion abnormalities.  . Calculus of gallbladder with acute cholecystitis and obstruction   . Gangrenous cholecystitis 08/17/2014  . Steroid-induced hyperglycemia 02/25/2015  . Acute on chronic combined systolic and diastolic CHF (congestive heart failure) ([place]) 02/24/2015  . CAP (community acquired pneumonia) 02/24/2015    Blood pressure 96/68, pulse 76, height 6' (1.829 m), weight 249 lb 1.6 oz (112.991 kg).    Travis Ferguson PharmD CPP [facility]

## 2015-05-29 NOTE — Assessment & Plan Note (Signed)
BP doing well, patient reports no symptoms of hypotension and states he feels better now than in quite some time.  Will not push the Entresto dose up, as his BP would not tolerate that increase.  Patient aware to continue with regular home BP and weight checks, and to notify our office of any concerns.

## 2015-05-30 LAB — BASIC METABOLIC PANEL
BUN: 18 mg/dL (ref 7–25)
CALCIUM: 9.4 mg/dL (ref 8.6–10.3)
CO2: 23 mmol/L (ref 20–31)
Chloride: 103 mmol/L (ref 98–110)
Creat: 1 mg/dL (ref 0.70–1.18)
Glucose, Bld: 95 mg/dL (ref 65–99)
POTASSIUM: 4.5 mmol/L (ref 3.5–5.3)
SODIUM: 138 mmol/L (ref 135–146)

## 2015-05-31 ENCOUNTER — Encounter (HOSPITAL_COMMUNITY)
Admission: RE | Admit: 2015-05-31 | Discharge: 2015-05-31 | Disposition: A | Discharge status: Home / Self Care | Payer: Medicare Other | Source: Ambulatory Visit | Attending: Cardiovascular Disease | Admitting: Cardiovascular Disease

## 2015-05-31 DIAGNOSIS — J449 Chronic obstructive pulmonary disease, unspecified: Secondary | ICD-10-CM | POA: Diagnosis not present

## 2015-06-01 ENCOUNTER — Encounter: Payer: Self-pay | Admitting: Pulmonary Disease

## 2015-06-01 ENCOUNTER — Ambulatory Visit (INDEPENDENT_AMBULATORY_CARE_PROVIDER_SITE_OTHER): Payer: Medicare Other | Admitting: Pulmonary Disease

## 2015-06-01 VITALS — BP 100/70 | HR 80 | Ht 72.0 in | Wt 247.0 lb

## 2015-06-01 DIAGNOSIS — J449 Chronic obstructive pulmonary disease, unspecified: Secondary | ICD-10-CM | POA: Diagnosis not present

## 2015-06-01 DIAGNOSIS — D860 Sarcoidosis of lung: Secondary | ICD-10-CM

## 2015-06-01 DIAGNOSIS — J9611 Chronic respiratory failure with hypoxia: Secondary | ICD-10-CM

## 2015-06-01 NOTE — Patient Instructions (Signed)
Follow up in 4 months 

## 2015-06-01 NOTE — Progress Notes (Signed)
Chief Complaint  Patient presents with  . Follow-up    Pt following for Sarcoidosis: pt states hes had a major improvement in his breathing since he has last been here. pt does c/o ocassional " needle " pains accross his abdomen and when taking a deep breath at times his ches does feel a little tight. pt using O2 qhs 2LPM DME: [facility]    Current Outpatient Prescriptions on File Prior to Visit  Medication Sig  . albuterol (PROVENTIL) (2.5 MG/3ML) 0.083% nebulizer solution Take 3 mLs (2.5 mg total) by nebulization every 4 (four) hours as needed for wheezing or shortness of breath.  . budesonide-formoterol (SYMBICORT) 160-4.5 MCG/ACT inhaler Inhale 2 puffs into the lungs at bedtime.  . finasteride (PROSCAR) 5 MG tablet Take 5 mg by mouth daily.   [name] FLUoxetine (PROZAC) 40 MG capsule Take 40 mg by mouth daily with breakfast.   . furosemide (LASIX) 20 MG tablet Take 1 tablet (20 mg total) by mouth daily.  [name] HYDROcodone-acetaminophen (NORCO/VICODIN) 5-325 MG per tablet Take 2 tablets by mouth 2 (two) times daily as needed (for pain).  . metoprolol tartrate (LOPRESSOR) 25 MG tablet Take 1 tablet (25 mg total) by mouth 2 (two) times daily.  . Multiple Vitamin (MULTIVITAMIN) tablet Take 1 tablet by mouth daily.  . OXYGEN Inhale 2 L/min into the lungs at bedtime.  [name] PROAIR HFA 108 (90 BASE) MCG/ACT inhaler 1-2 PUFFS EVERY 4-6 HOURS AS NEEDED (Patient taking differently: 1-2 PUFFS EVERY 4-6 HOURS AS NEEDED for shortness of breath)  . sacubitril-valsartan (ENTRESTO) 24-26 MG Take 1 tablet by mouth 2 (two) times daily.  . simvastatin (ZOCOR) 20 MG tablet Take 1 tablet by mouth every evening.   . sodium chloride (OCEAN) 0.65 % SOLN nasal spray Place 1-2 sprays into both nostrils daily as needed for congestion.  [name] spironolactone (ALDACTONE) 25 MG tablet Take 1 tablet (25 mg total) by mouth daily.  . traZODone (DESYREL) 100 MG tablet Take 1 tablet (100 mg total) by mouth at bedtime.   No current  facility-administered medications on file prior to visit.    TESTS: PSG May 2008>> AHI 8  Spirometry 11/18/08>>FEV1 2.15 (61%), FEV1% 62  Bronchoscopy 07/28/12 >> focal granulomatous inflammation Spirometry 03/14/14 >> FEV1 2.10 (60%), FEV1% 57 Echo 08/19/14 >> EF 50 to 77%, grade 1 diastolic dysfx Echo 82/42/35 >> EF 30 to 35%   CHEST IMAGING: CT chest 05/19/12>>superior segment LLL infiltrate, b/l hilar and mediastinal LAN with calcification CT chest 07/13/12 >> atherosclerosis, Lt hilar adenopathy, LLL superior segment narrowing, mass like opacity superior segment LLL 5.4 x 1.9 cm, multiple nodular areas LLL, diffuse GGO LLL PET scan 07/22/12 >> mildly hypermetabolic mediastinal adenopathy (SUV 5.8), superior segment nodules hypermetabolic (SUV 36.1) CT chest 08/31/12 >> decreased size of lesions CT chest 12/02/12 >> borderline mediastinal/hilar LAN, nodular septal thickening b/l, no change superior segment LLL, centrilobular emphysema CT chest 03/19/13 >> ?slight increase in LLL consolidative change, borderline LAN CT chest 08/24/13 >> 1.9 cm Rt paratracheal LAN, calcified 2.1 cm subcarinal LAN, narrowing of mainstem bronchi, chronic infiltrate LLL CT chest 03/16/14 >> atherosclerosis, no change in mediastinal and b/l hilar LAN, no change in LLL mass like opacity, thickening of interstitium, scattered sub-pleural nodules, small Lt pleural effusion, gallstones CT chest 08/19/14 >> small b/l effusions, emphysema, no change LLL  PMHx >> Depression, PSTD, Migraines, combined CHF, CAD, HTN, HLD, Back pain, SDH 2010, Pseudomonal PNA 2016  PSHx, Medications, Allergies, Fhx, Shx reviewed.   BP 100/70  mmHg  Pulse 80  Ht 6' (1.829 m)  Wt 247 lb (112.038 kg)  BMI 33.49 kg/m2  SpO2 96%   History of Present Illness: Travis Ferguson is a 74 y.o. male former smoker with COPD, and sarcoidosis.  He has been doing well.  He is going to pulmonary rehab and this has helped.  He denies cough, wheeze, or  sputum.  He is using the bathroom frequently.  He denies fever.  He has to take pain medicine twice per day.  Physical Exam:  General - No distress ENT - no sinus tenderness, no oral exudate, no LAN Cardiac - s1s2 regular, no murmur Chest - prolonged exhalation, no wheeze/rales Back - No focal tenderness Abd - Soft, non-tender Ext - No edema Neuro - Normal strength Skin - No rashes Psych - normal mood, and behavior   Assessment/Plan:  COPD with chronic bronchitis. Plan: - continue symbicort - continue prn proair  Nocturnal hypoxia. Related to COPD, pulmonary sarcoidosis, presumed OSA. Plan: - continue 2 liters oxygen at night  Pulmonary sarcoidosis. Seems stable. Plan: - monitor clinically  Combined heart failure. Plan: - f/u with cardiology   Travis Mires, MD [facility] Pager:  [phone] 06/01/2015, 1:48 PM

## 2015-06-01 NOTE — Addendum Note (Signed)
Addended by: Chesley Mires on: 06/01/2015 02:31 PM   Modules accepted: Orders

## 2015-06-02 ENCOUNTER — Encounter (HOSPITAL_COMMUNITY)
Admission: RE | Admit: 2015-06-02 | Discharge: 2015-06-02 | Disposition: A | Discharge status: Home / Self Care | Payer: Medicare Other | Source: Ambulatory Visit | Attending: Cardiovascular Disease | Admitting: Cardiovascular Disease

## 2015-06-02 DIAGNOSIS — J449 Chronic obstructive pulmonary disease, unspecified: Secondary | ICD-10-CM | POA: Diagnosis not present

## 2015-06-05 ENCOUNTER — Encounter (HOSPITAL_COMMUNITY)
Admission: RE | Admit: 2015-06-05 | Discharge: 2015-06-05 | Disposition: A | Discharge status: Home / Self Care | Payer: Medicare Other | Source: Ambulatory Visit | Attending: Cardiovascular Disease | Admitting: Cardiovascular Disease

## 2015-06-05 DIAGNOSIS — J449 Chronic obstructive pulmonary disease, unspecified: Secondary | ICD-10-CM | POA: Diagnosis not present

## 2015-06-07 ENCOUNTER — Encounter (HOSPITAL_COMMUNITY)
Admission: RE | Admit: 2015-06-07 | Discharge: 2015-06-07 | Disposition: A | Discharge status: Home / Self Care | Payer: Medicare Other | Source: Ambulatory Visit | Attending: Cardiovascular Disease | Admitting: Cardiovascular Disease

## 2015-06-07 DIAGNOSIS — J449 Chronic obstructive pulmonary disease, unspecified: Secondary | ICD-10-CM | POA: Diagnosis not present

## 2015-06-08 ENCOUNTER — Telehealth (HOSPITAL_COMMUNITY): Payer: Self-pay

## 2015-06-08 NOTE — Telephone Encounter (Signed)
Called patient to discuss Pulmonary Rehab. Patient graduates from [place] on 06/16/15. Patient would like to start Pulmonary Rehab at the beginning of the year. Will check insurance to verify patient's financial responsibility.

## 2015-06-09 ENCOUNTER — Encounter (HOSPITAL_COMMUNITY): Payer: Medicare Other

## 2015-06-09 ENCOUNTER — Telehealth (HOSPITAL_COMMUNITY): Payer: Self-pay | Admitting: Cardiac Rehabilitation

## 2015-06-09 NOTE — Telephone Encounter (Signed)
pc received from pt reporting absence from cardiac rehab today due to shoulder injury.  Pt has been evaluated by his PCP and has further testing scheduled next week.  Pt will plan to return to cardiac rehab next week if able and not participate in activities involving his shoulder.  Pt verbalized understanding.

## 2015-06-12 ENCOUNTER — Encounter (HOSPITAL_COMMUNITY)
Admission: RE | Admit: 2015-06-12 | Discharge: 2015-06-12 | Disposition: A | Discharge status: Home / Self Care | Payer: Medicare Other | Source: Ambulatory Visit | Attending: Cardiovascular Disease | Admitting: Cardiovascular Disease

## 2015-06-12 ENCOUNTER — Encounter (HOSPITAL_COMMUNITY): Payer: Self-pay

## 2015-06-12 DIAGNOSIS — J449 Chronic obstructive pulmonary disease, unspecified: Secondary | ICD-10-CM | POA: Diagnosis not present

## 2015-06-12 NOTE — Progress Notes (Signed)
Pt graduated from cardiac rehab program today with completion of  30 exercise sessions in Phase II. Pt exited program early due to rotator cuff injury and associated shoulder pain.  Pt attendance has been limited by his physical condition.   Medication list reconciled. Repeat  PHQ score-9, this is higher than admission score.  However pt relates this to his history of PTSD and depression.  Pt feels his depression is increasing due to his physical condition declining. Pt is currently disappointed in his shoulder pain and associated disability.  Pt reports a strong faith and therefore expresses he is "realistic" about his life.  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   Pt feels his breathing has improved and he has more energy overall.   Pt plans to continue exercising on his own. Pt would like to return to pulmonary rehab program once his shoulder pain resolves.

## 2015-06-14 ENCOUNTER — Encounter (HOSPITAL_COMMUNITY): Payer: Medicare Other

## 2015-06-15 ENCOUNTER — Encounter: Payer: Self-pay | Admitting: Cardiovascular Disease

## 2015-06-16 ENCOUNTER — Encounter (HOSPITAL_COMMUNITY): Payer: Medicare Other

## 2015-06-21 ENCOUNTER — Telehealth: Payer: Self-pay | Admitting: Pulmonary Disease

## 2015-06-21 MED ORDER — AMOXICILLIN-POT CLAVULANATE 875-125 MG PO TABS: 1.0000 | ORAL_TABLET | Freq: Two times a day (BID) | ORAL | Status: DC

## 2015-06-21 MED ORDER — PREDNISONE 10 MG PO TABS: ORAL_TABLET | ORAL | Status: DC

## 2015-06-21 NOTE — Telephone Encounter (Signed)
Called spoke with pt and is aware of recs. RX's sent in. Nothing further needed

## 2015-06-21 NOTE — Telephone Encounter (Signed)
Sounds like he has sinus infection.    Please send script for augment 875 mg bid, dispense 14 pills with no refills.  Please script for prednisone 10 mg pills >> 3 pills daily for 2 days, 2 pills daily for 2 days, 1 pill daily for 2 days.  Dispense 12 pills with no refills.

## 2015-06-21 NOTE — Telephone Encounter (Signed)
Called and spoke with patient. He c/o chest congestion, prod cough with white mucus, sinus pressure/drainage, and wheezing x 1 week. Denies any SOB, fever, nausea or vomiting. Verified pharmacy as [facility] on [address]. Patient states he is currently not taking any OTC meds for these symptoms and would like VS's recs before starting any meds. I explained to him I would send the message to VS and return his call with VS's recs. Patient voiced understanding and had no further questions.   VS please advise

## 2015-06-22 ENCOUNTER — Telehealth (HOSPITAL_COMMUNITY): Payer: Self-pay

## 2015-06-22 NOTE — Telephone Encounter (Signed)
Called and discussed Pulmonary Rehab with patient. Patient is going to verify insurance and follow up with [place].

## 2015-07-11 ENCOUNTER — Telehealth: Payer: Self-pay | Admitting: Pulmonary Disease

## 2015-07-11 NOTE — Telephone Encounter (Signed)
I called spoke with pt. He reports he has an option of having rotator cuff surgery. He wants an appt soon with VS to discuss his COPD and the surgery. appt scheduled for Thursday 1/19. Nothing further needed

## 2015-07-13 ENCOUNTER — Ambulatory Visit (INDEPENDENT_AMBULATORY_CARE_PROVIDER_SITE_OTHER): Payer: Medicare Other | Admitting: Pulmonary Disease

## 2015-07-13 ENCOUNTER — Telehealth (HOSPITAL_COMMUNITY): Payer: Self-pay | Admitting: *Deleted

## 2015-07-13 ENCOUNTER — Encounter: Payer: Self-pay | Admitting: Pulmonary Disease

## 2015-07-13 VITALS — BP 102/70 | HR 77 | Ht 71.0 in | Wt 250.6 lb

## 2015-07-13 DIAGNOSIS — J9611 Chronic respiratory failure with hypoxia: Secondary | ICD-10-CM | POA: Diagnosis not present

## 2015-07-13 DIAGNOSIS — D860 Sarcoidosis of lung: Secondary | ICD-10-CM

## 2015-07-13 DIAGNOSIS — J449 Chronic obstructive pulmonary disease, unspecified: Secondary | ICD-10-CM

## 2015-07-13 DIAGNOSIS — Z01811 Encounter for preprocedural respiratory examination: Secondary | ICD-10-CM

## 2015-07-13 NOTE — Progress Notes (Signed)
Current Outpatient Prescriptions on File Prior to Visit  Medication Sig  . albuterol (PROVENTIL) (2.5 MG/3ML) 0.083% nebulizer solution Take 3 mLs (2.5 mg total) by nebulization every 4 (four) hours as needed for wheezing or shortness of breath.  . budesonide-formoterol (SYMBICORT) 160-4.5 MCG/ACT inhaler Inhale 2 puffs into the lungs at bedtime.  . finasteride (PROSCAR) 5 MG tablet Take 5 mg by mouth daily.   [name] FLUoxetine (PROZAC) 40 MG capsule Take 40 mg by mouth daily with breakfast.   . furosemide (LASIX) 20 MG tablet Take 1 tablet (20 mg total) by mouth daily.  [name] HYDROcodone-acetaminophen (NORCO/VICODIN) 5-325 MG per tablet Take 2 tablets by mouth 2 (two) times daily as needed (for pain).  . metoprolol tartrate (LOPRESSOR) 25 MG tablet Take 1 tablet (25 mg total) by mouth 2 (two) times daily.  . Multiple Vitamin (MULTIVITAMIN) tablet Take 1 tablet by mouth daily.  . OXYGEN Inhale 2 L/min into the lungs at bedtime.  [name] PROAIR HFA 108 (90 BASE) MCG/ACT inhaler 1-2 PUFFS EVERY 4-6 HOURS AS NEEDED (Patient taking differently: 1-2 PUFFS EVERY 4-6 HOURS AS NEEDED for shortness of breath)  . sacubitril-valsartan (ENTRESTO) 24-26 MG Take 1 tablet by mouth 2 (two) times daily.  . simvastatin (ZOCOR) 20 MG tablet Take 1 tablet by mouth every evening.   . sodium chloride (OCEAN) 0.65 % SOLN nasal spray Place 1-2 sprays into both nostrils daily as needed for congestion.  [name] spironolactone (ALDACTONE) 25 MG tablet Take 1 tablet (25 mg total) by mouth daily.  . traZODone (DESYREL) 100 MG tablet Take 1 tablet (100 mg total) by mouth at bedtime.   No current facility-administered medications on file prior to visit.    TESTS: PSG May 2008>> AHI 8  Spirometry 11/18/08>>FEV1 2.15 (61%), FEV1% 62  Bronchoscopy 07/28/12 >> focal granulomatous inflammation Spirometry 03/14/14 >> FEV1 2.10 (60%), FEV1% 57 Echo 08/19/14 >> EF 50 to 60%, grade 1 diastolic dysfx Echo 10/93/23 >> EF 30 to 35%   CHEST  IMAGING: CT chest 05/19/12>>superior segment LLL infiltrate, b/l hilar and mediastinal LAN with calcification CT chest 07/13/12 >> atherosclerosis, Lt hilar adenopathy, LLL superior segment narrowing, mass like opacity superior segment LLL 5.4 x 1.9 cm, multiple nodular areas LLL, diffuse GGO LLL PET scan 07/22/12 >> mildly hypermetabolic mediastinal adenopathy (SUV 5.8), superior segment nodules hypermetabolic (SUV 55.7) CT chest 08/31/12 >> decreased size of lesions CT chest 12/02/12 >> borderline mediastinal/hilar LAN, nodular septal thickening b/l, no change superior segment LLL, centrilobular emphysema CT chest 03/19/13 >> ?slight increase in LLL consolidative change, borderline LAN CT chest 08/24/13 >> 1.9 cm Rt paratracheal LAN, calcified 2.1 cm subcarinal LAN, narrowing of mainstem bronchi, chronic infiltrate LLL CT chest 03/16/14 >> atherosclerosis, no change in mediastinal and b/l hilar LAN, no change in LLL mass like opacity, thickening of interstitium, scattered sub-pleural nodules, small Lt pleural effusion, gallstones CT chest 08/19/14 >> small b/l effusions, emphysema, no change LLL  Chief Complaint  Patient presents with  . Follow-up    Tendons on top of rotator cuff can not be repaired. Pt would like to discuss if surgery would be safe.  Would like Symbicort sample if avaliable.     Past medical history Depression, PSTD, Migraines, combined CHF, CAD, HTN, HLD, Back pain, SDH 2010, Pseudomonal PNA 2016  PSHx, Medications, Allergies, Fhx, Shx reviewed.   BP 102/70 mmHg  Pulse 77  Ht '5\' 11"'$  (1.803 m)  Wt 250 lb 9.6 oz (113.671 kg)  BMI 34.97 kg/m2  SpO2  95%   History of Present Illness: Travis Ferguson is a 75 y.o. male former smoker with COPD, and sarcoidosis.  He has terrible left shoulder pain.  He has been seen by Dr. Veverly Fells with ortho and is being assessed for surgery.  His breathing has been okay.  He is not having cough, wheeze, or sputum.  He gets around with routine  activity with difficulty with his breathing.  His activity is mostly limited by shoulder and back pain.  He has appt with cardiology scheduled.  Physical Exam:  General - No distress ENT - no sinus tenderness, no oral exudate, no LAN Cardiac - s1s2 regular, no murmur Chest - prolonged exhalation, no wheeze/rales Back - No focal tenderness Abd - Soft, non-tender Ext - No edema Neuro - Normal strength Skin - No rashes Psych - normal mood, and behavior   Assessment/Plan:  COPD with chronic bronchitis. Plan: - continue symbicort - continue prn proair  Nocturnal hypoxia. Related to COPD, pulmonary sarcoidosis, presumed OSA. Plan: - continue 2 liters oxygen at night  Pulmonary sarcoidosis. Seems stable. Plan: - monitor clinically  Combined heart failure. Plan: - f/u with cardiology  Pre-operative respiratory assessment. Discussion: He his being assessed for left total shoulder replacement.  This is a low risk procedure, and his OR time is estimated to be 2.5 hours.  His shoulder symptoms are significantly impacting his quality of life.  I explained that he is at higher, but not prohibitive, risk for perioperative pulmonary complications. Plan: - he should continue inhaler therapy during perioperative period - he should have close monitoring of his oxygenation during post operative period - he might need monitoring in step down or ICU after surgery - he has not been on prednisone for sarcoidosis recently >> should not need stress dose steroids - can consult pulmonary medicine as needed after surgery - he will need cardiology assessment before he is cleared to proceed with surgery   Chesley Mires, MD [facility] Pager:  [phone] 07/13/2015, 3:16 PM

## 2015-07-13 NOTE — Patient Instructions (Signed)
Follow up in 4 months 

## 2015-07-28 ENCOUNTER — Telehealth: Payer: Self-pay

## 2015-07-28 NOTE — Telephone Encounter (Signed)
Requesting surgical clearance:   1. Type of surgery: left Shoulder: left shoulder reverse TSA  2. Surgeon: Dr. Veverly Fells  3. Surgical date: pending clearance  4. Medications that need to be held: none  5. CAD: Yes     6. I will defer to: [facility] Attn: Santiago Bur [phone] phone [phone] fax

## 2015-07-30 NOTE — Telephone Encounter (Signed)
Moderately elevated CV risk for surgery because of moderately severe LV dysfunction

## 2015-07-31 ENCOUNTER — Ambulatory Visit (INDEPENDENT_AMBULATORY_CARE_PROVIDER_SITE_OTHER): Payer: Medicare Other | Admitting: Physician Assistant

## 2015-07-31 ENCOUNTER — Encounter: Payer: Self-pay | Admitting: Physician Assistant

## 2015-07-31 ENCOUNTER — Telehealth: Payer: Self-pay

## 2015-07-31 VITALS — BP 110/60 | HR 82 | Ht 72.0 in | Wt 252.0 lb

## 2015-07-31 DIAGNOSIS — Z01818 Encounter for other preprocedural examination: Secondary | ICD-10-CM

## 2015-07-31 DIAGNOSIS — I5042 Chronic combined systolic (congestive) and diastolic (congestive) heart failure: Secondary | ICD-10-CM | POA: Diagnosis not present

## 2015-07-31 DIAGNOSIS — I428 Other cardiomyopathies: Secondary | ICD-10-CM

## 2015-07-31 DIAGNOSIS — I429 Cardiomyopathy, unspecified: Secondary | ICD-10-CM

## 2015-07-31 DIAGNOSIS — I5043 Acute on chronic combined systolic (congestive) and diastolic (congestive) heart failure: Secondary | ICD-10-CM | POA: Diagnosis not present

## 2015-07-31 NOTE — Progress Notes (Signed)
Cardiology Office Note   Date:  07/31/2015   ID:  Travis Ferguson, DOB Aug 27, 1940, MRN [id number]  PCP:  Jerlyn Ly, MD  Cardiologist:  Dr Stacy Gardner, PA-C   Chief Complaint  Patient presents with  . Follow-up    no chest pain, no shortness of breath, occassional edema, no pain or cramping in legs, occassional lightheadedness or dizziness    History of Present Illness: Travis Ferguson is a 75 y.o. male with a history of COPD, sarcoid, HTN, HLD, FH CAD, SDH, echo & MV 03/2014 w/ nl EF, grade 1 dd, no ischemia, chronic back pain.   Seen by Dr Gwenlyn Found 12/2014 after EF 20-25%, S-CHF, R/L cath 01/23/2015 w/ minimal CAD, EF remained 30%, pt seen by Dr Sallyanne Kuster but refused ICD. EF 30-35% 03/2015. On Entresto.  Pt needs shoulder surgery, cardiac evaluation requested.  Travis Ferguson presents for preoperative evaluation prior to rotator cuff surgery.  Mr. Bitner has records with him today indicating his blood pressure and heart rate have been well controlled. He states his weight has been stable and was 247 pounds on his scales. He feels his pulmonary status is at baseline and this is therefore his dry weight. He was going to pulmonary rehabilitation but after he tore his rotator cuff, shoulder pain Pattern has not been going. Dr. Maggie Font post surgery and he needs surgical clearance.  He has not had any chest pain. He has some chronic dyspnea on exertion has not changed recently. He has minimal daytime lower extremity edema and denies orthopnea or PND. He reiterates that he does not want an ICD. He is willing to have CPR but does not want to "live on a ventilator". He states that if he becomes in irreversible,, he wishes to be removed from the machines. He is not afraid of dying during the surgery but is afraid of or bit of tea, and having no quality of life.  His only current issue is night mares. He has been having them for several months on a regular basis. His trazodone was  decreased from 100 mg down to 50 mg and that has improved to nightmares. He thinks the trazodone may be responsible for them and is going to continue to work on tapering the dose and discontinuing it. Otherwise, he has no cardiac issues.   Past Medical History  Diagnosis Date  . COPD (chronic obstructive pulmonary disease) ([place])     a. Former Airline pilot, also smoked a pipe.  . Depression   . PTSD (post-traumatic stress disorder)   . Migraine   . GERD (gastroesophageal reflux disease)   . Dyslipidemia   . Rhinitis   . Cervical disc disease   . Subdural hematoma ([place]) 11/10    a. 2010 s/p surgery - diagnosed several weeks after a fall.  . Hypertension   . Coronary artery calcification seen on CAT scan   . Sarcoidosis ([place])   . Atherosclerosis   . Dyspnea     chronic  . Back pain     chronic back pain/under pain management  . Inguinal hernia     right side  . Colon polyps     adenomatous and hyperplastic  . Sepsis ([place])   . Gallstones   . Sensation problem     right side-lateral hip to knee" decreased sensation and tingling feeling" -has informed Dr. Joylene Draft, Dr. Henrene Pastor, Dr. Lucia Gaskins of this.  . Encounter for biliary drainage tube placement     remains  with drainage bag to right side for  biliary drainage.  . Sinus congestion     10-27-14 at present some issues-"not bad"  . History of oxygen administration     @ 2 l/m nasally at bedtime.  . Hiatal hernia     sensation problem ("right lateral side hip to knee").  . OSA (obstructive sleep apnea)     does not use CPAP  . Pneumonia     age 69 or 89   . Anxiety   . Frequent urination   . Arthritis   . Chronic bronchitis ([place])   . Colon polyps 08/02/2014    Tubular adenoma x 3, Hyperplastic-1  . Diastolic dysfunction     grade 1 diastolic dysfunction on 2-D echo  . Left ventricular dysfunction     ejection fraction of 25-30% with regional wall motion abnormalities.  . Calculus of gallbladder with acute cholecystitis and  obstruction   . Gangrenous cholecystitis 08/17/2014  . Steroid-induced hyperglycemia 02/25/2015  . Acute on chronic combined systolic and diastolic CHF (congestive heart failure) ([place]) 02/24/2015  . CAP (community acquired pneumonia) 02/24/2015    Past Surgical History  Procedure Laterality Date  . Craniotomy  11/10    right frontal"hemorrhage evacuation from fall injury"  . Video bronchoscopy  07/28/2012    Procedure: VIDEO BRONCHOSCOPY WITH FLUORO;  Surgeon: Chesley Mires, MD;  Location: [facility];  Service: Cardiopulmonary;  Laterality: Bilateral;  . Hernia repair Bilateral     right and left inguinal  . Knee arthroscopy Right   . Lumbar laminectomy/decompression microdiscectomy Right 12/15/2012    Procedure: LUMBAR LAMINECTOMY/DECOMPRESSION MICRODISCECTOMY 1 LEVEL;  Surgeon: Elaina Hoops, MD;  Location: [facility];  Service: Neurosurgery;  Laterality: Right;  Right Lumbar four-five laminectomy/foraminotomy  . Gallbladder surgery      drain tube placed  . Ercp N/A 11/07/2014    Procedure: ENDOSCOPIC RETROGRADE CHOLANGIOPANCREATOGRAPHY (ERCP);  Surgeon: Irene Shipper, MD;  Location: [facility];  Service: Endoscopy;  Laterality: N/A;  . Cholecystectomy N/A 11/08/2014    Procedure: LAPAROSCOPIC CHOLECYSTECTOMY ;  Surgeon: Alphonsa Overall, MD;  [facility]: [facility];  Service: General;  Laterality: N/A;  . Cardiac catheterization N/A 01/23/2015    Procedure: Right/Left Heart Cath and Coronary Angiography;  Surgeon: Lorretta Harp, MD;  Location: [facility];  Service: Cardiovascular;  Laterality: N/A;    Current Outpatient Prescriptions  Medication Sig Dispense Refill  . albuterol (PROVENTIL) (2.5 MG/3ML) 0.083% nebulizer solution Take 3 mLs (2.5 mg total) by nebulization every 4 (four) hours as needed for wheezing or shortness of breath. 75 mL 12  . budesonide-formoterol (SYMBICORT) 160-4.5 MCG/ACT inhaler Inhale 2 puffs into the lungs at bedtime. 2 Inhaler 0  . finasteride (PROSCAR) 5 MG  tablet Take 5 mg by mouth daily.     [name] FLUoxetine (PROZAC) 40 MG capsule Take 40 mg by mouth daily with breakfast.     . furosemide (LASIX) 20 MG tablet Take 1 tablet (20 mg total) by mouth daily. 30 tablet 4  . HYDROcodone-acetaminophen (NORCO/VICODIN) 5-325 MG per tablet Take 2 tablets by mouth 2 (two) times daily as needed (for pain). 30 tablet 0  . metoprolol tartrate (LOPRESSOR) 25 MG tablet Take 1 tablet (25 mg total) by mouth 2 (two) times daily. 90 tablet 3  . Multiple Vitamin (MULTIVITAMIN) tablet Take 1 tablet by mouth daily.    . OXYGEN Inhale 2 L/min into the lungs at bedtime.    [name] PROAIR HFA 108 (90 BASE) MCG/ACT  inhaler 1-2 PUFFS EVERY 4-6 HOURS AS NEEDED (Patient taking differently: 1-2 PUFFS EVERY 4-6 HOURS AS NEEDED for shortness of breath) 8.5 Inhaler 1  . sacubitril-valsartan (ENTRESTO) 24-26 MG Take 1 tablet by mouth 2 (two) times daily. 60 tablet 3  . simvastatin (ZOCOR) 20 MG tablet Take 1 tablet by mouth every evening.     . sodium chloride (OCEAN) 0.65 % SOLN nasal spray Place 1-2 sprays into both nostrils daily as needed for congestion.    [name] spironolactone (ALDACTONE) 25 MG tablet Take 1 tablet (25 mg total) by mouth daily. 30 tablet 4  . traZODone (DESYREL) 100 MG tablet Take 1 tablet (100 mg total) by mouth at bedtime. 15 tablet 0   No current facility-administered medications for this visit.    Allergies:   Statins    Social History:  The patient  reports that he quit smoking about 7 years ago. His smoking use included Pipe. He has never used smokeless tobacco. He reports that he does not drink alcohol or use illicit drugs.   Family History:  The patient's family history includes Colon polyps in his brother; Congestive Heart Failure in his brother; Heart disease in his father and mother; Stomach cancer in his paternal grandmother. There is no history of Colon cancer, Pancreatic cancer, Rectal cancer, or Esophageal cancer.    ROS:  Please see the history of present  illness. All other systems are reviewed and negative.    PHYSICAL EXAM: VS:  BP 110/60 mmHg  Pulse 82  Ht 6' (1.829 m)  Wt 252 lb (114.306 kg)  BMI 34.17 kg/m2 , BMI Body mass index is 34.17 kg/(m^2). GEN: Well nourished, well developed, male in no acute distress HEENT: normal for age  Neck: no JVD, no carotid bruit, no masses Cardiac: RRR; soft murmur, no rubs, or gallops Respiratory: Scattered dry rales bilaterally, normal work of breathing GI: soft, nontender, nondistended, + BS MS: no deformity or atrophy; no edema; distal pulses are 2+ in all 4 extremities  Skin: warm and dry, no rash Neuro:  Strength and sensation are intact Psych: euthymic mood, full affect   EKG:  EKG is ordered today. The ekg ordered today demonstrates sinus rhythm with occasional PVCs He has Q waves in lead 3 and aVF that are slightly larger than previous ECGs but no other changes   Recent Labs: 11/10/2014: Magnesium 1.8 12/29/2014: Pro B Natriuretic peptide (BNP) 534.0* 02/24/2015: ALT 17; B Natriuretic Peptide 424.0*; TSH 0.773 02/25/2015: Hemoglobin 13.8; Platelets 230 05/29/2015: BUN 18; Creat 1.00; Potassium 4.5; Sodium 138    Wt Readings from Last 3 Encounters:  07/31/15 252 lb (114.306 kg)  07/13/15 250 lb 9.6 oz (113.671 kg)  06/01/15 247 lb (112.038 kg)     Other studies Reviewed: Additional studies/ records that were reviewed today include: Office Notes and hospital records, other testing.  ASSESSMENT AND PLAN:  1.  Prehospital evaluation: He had a heart cath in August 2016 showing no significant CAD. He has a nonischemic cardiomyopathy. His volume status is currently at baseline. He is at increased risk because of his low EF, but there is no other workup or evaluation that needs to be done. He is at increased but acceptable risk for surgery.  2. Nonischemic cardiomyopathy: His blood pressure and heart rate are well controlled today. He is on Lasix, a beta blocker, Aldactone, and Entresto.  He is to continue these medications in the perioperative period. Care will have to be taken during the surgery not to  volume overloaded.  3. Chronic combined systolic and diastolic CHF: Patient states his weight has not increased recently on his home scales and he feels his volume status is at baseline. His weight is up slightly on our scales but he reiterated that he felt like his respiratory status was fine. He is to continue to track weights and let us know if he has a weight increase.   Current medicines are reviewed at length with the patient today.  The patient does not have concerns regarding medicines.  The following changes have been made:  no change  Labs/ tests ordered today include: ECG   Disposition:   FU with Dr. Gwenlyn Found  Signed, Lenoard Aden  07/31/2015 1:57 PM    [facility] [address], [place], [place]  [address] Phone: [phone]; Fax: [phone]

## 2015-07-31 NOTE — Telephone Encounter (Signed)
Left message for patient to call back. Patient left his BP Log sheet. Will leave copy at front desk for patient to pick-up if he needs it back.

## 2015-07-31 NOTE — Telephone Encounter (Signed)
Pt clearance faxed to [facility].

## 2015-07-31 NOTE — Patient Instructions (Addendum)
Medication Instructions:  Your physician recommends that you continue on your current medications as directed. Please refer to the Current Medication list given to you today.  Labwork: NONE  Testing/Procedures: Your physician has requested that you have an echocardiogram. Echocardiography is a painless test that uses sound waves to create images of your heart. It provides your doctor with information about the size and shape of your heart and how well your heart's chambers and valves are working. This procedure takes approximately one hour. There are no restrictions for this procedure.  Follow-Up: Your physician wants you to follow-up in: 3 months with Dr. Gwenlyn Found.   If you need a refill on your cardiac medications before your next appointment, please call your pharmacy.

## 2015-09-07 NOTE — H&P (Signed)
Travis Ferguson is an 75 y.o. male.    Chief Complaint: left shoulder pain  HPI: Pt is a 75 y.o. male complaining of left shoulder pain for multiple years. Pain had continually increased since the beginning. X-rays in the clinic show end-stage arthritic changes of the left shoulder. Pt has tried various conservative treatments which have failed to alleviate their symptoms, including injections and therapy. Various options are discussed with the patient. Risks, benefits and expectations were discussed with the patient. Patient understand the risks, benefits and expectations and wishes to proceed with surgery.   PCP:  Jerlyn Ly, MD  D/C Plans: Home  PMH: Past Medical History  Diagnosis Date  . COPD (chronic obstructive pulmonary disease) ([place])     a. Former Airline pilot, also smoked a pipe.  . Depression   . PTSD (post-traumatic stress disorder)   . Migraine   . GERD (gastroesophageal reflux disease)   . Dyslipidemia   . Rhinitis   . Cervical disc disease   . Subdural hematoma ([place]) 11/10    a. 2010 s/p surgery - diagnosed several weeks after a fall.  . Hypertension   . Coronary artery calcification seen on CAT scan   . Sarcoidosis ([place])   . Atherosclerosis   . Dyspnea     chronic  . Back pain     chronic back pain/under pain management  . Inguinal hernia     right side  . Colon polyps     adenomatous and hyperplastic  . Sepsis ([place])   . Gallstones   . Sensation problem     right side-lateral hip to knee" decreased sensation and tingling feeling" -has informed Dr. Joylene Draft, Dr. Henrene Pastor, Dr. Lucia Gaskins of this.  . Encounter for biliary drainage tube placement     remains with drainage bag to right side for  biliary drainage.  . Sinus congestion     10-27-14 at present some issues-"not bad"  . History of oxygen administration     @ 2 l/m nasally at bedtime.  . Hiatal hernia     sensation problem ("right lateral side hip to knee").  . OSA (obstructive sleep apnea)     does not  use CPAP  . Pneumonia     age 63 or 58   . Anxiety   . Frequent urination   . Arthritis   . Chronic bronchitis ([place])   . Colon polyps 08/02/2014    Tubular adenoma x 3, Hyperplastic-1  . Diastolic dysfunction     grade 1 diastolic dysfunction on 2-D echo  . Left ventricular dysfunction     ejection fraction of 25-30% with regional wall motion abnormalities.  . Calculus of gallbladder with acute cholecystitis and obstruction   . Gangrenous cholecystitis 08/17/2014  . Steroid-induced hyperglycemia 02/25/2015  . Acute on chronic combined systolic and diastolic CHF (congestive heart failure) ([place]) 02/24/2015  . CAP (community acquired pneumonia) 02/24/2015    PSH: Past Surgical History  Procedure Laterality Date  . Craniotomy  11/10    right frontal"hemorrhage evacuation from fall injury"  . Video bronchoscopy  07/28/2012    Procedure: VIDEO BRONCHOSCOPY WITH FLUORO;  Surgeon: Chesley Mires, MD;  [facility];  Service: Cardiopulmonary;  Laterality: Bilateral;  . Hernia repair Bilateral     right and left inguinal  . Knee arthroscopy Right   . Lumbar laminectomy/decompression microdiscectomy Right 12/15/2012    Procedure: LUMBAR LAMINECTOMY/DECOMPRESSION MICRODISCECTOMY 1 LEVEL;  Surgeon: Elaina Hoops, MD;  Location: [facility];  Service: Neurosurgery;  Laterality: Right;  Right Lumbar four-five laminectomy/foraminotomy  . Gallbladder surgery      drain tube placed  . Ercp N/A 11/07/2014    Procedure: ENDOSCOPIC RETROGRADE CHOLANGIOPANCREATOGRAPHY (ERCP);  Surgeon: Irene Shipper, MD;  Location: [facility];  Service: Endoscopy;  Laterality: N/A;  . Cholecystectomy N/A 11/08/2014    Procedure: LAPAROSCOPIC CHOLECYSTECTOMY ;  Surgeon: Alphonsa Overall, MD;  [facility];  Service: General;  Laterality: N/A;  . Cardiac catheterization N/A 01/23/2015    Procedure: Right/Left Heart Cath and Coronary Angiography;  Surgeon: Lorretta Harp, MD;  Location: [facility];  Service:  Cardiovascular;  Laterality: N/A;    Social History:  reports that he quit smoking about 7 years ago. His smoking use included Pipe. He has never used smokeless tobacco. He reports that he does not drink alcohol or use illicit drugs.  Allergies:  Allergies  Allergen Reactions  . Statins Other (See Comments)    Muscle aches - tolerating Vytorin    Medications: No current facility-administered medications for this encounter.   Current Outpatient Prescriptions  Medication Sig Dispense Refill  . albuterol (PROVENTIL) (2.5 MG/3ML) 0.083% nebulizer solution Take 3 mLs (2.5 mg total) by nebulization every 4 (four) hours as needed for wheezing or shortness of breath. 75 mL 12  . budesonide-formoterol (SYMBICORT) 160-4.5 MCG/ACT inhaler Inhale 2 puffs into the lungs at bedtime. 2 Inhaler 0  . finasteride (PROSCAR) 5 MG tablet Take 5 mg by mouth daily.     [name] FLUoxetine (PROZAC) 40 MG capsule Take 40 mg by mouth daily with breakfast.     . furosemide (LASIX) 20 MG tablet Take 1 tablet (20 mg total) by mouth daily. 30 tablet 4  . HYDROcodone-acetaminophen (NORCO/VICODIN) 5-325 MG per tablet Take 2 tablets by mouth 2 (two) times daily as needed (for pain). 30 tablet 0  . metoprolol tartrate (LOPRESSOR) 25 MG tablet Take 1 tablet (25 mg total) by mouth 2 (two) times daily. 90 tablet 3  . Multiple Vitamin (MULTIVITAMIN) tablet Take 1 tablet by mouth daily.    . OXYGEN Inhale 2 L/min into the lungs at bedtime.    [name] PROAIR HFA 108 (90 BASE) MCG/ACT inhaler 1-2 PUFFS EVERY 4-6 HOURS AS NEEDED (Patient taking differently: 1-2 PUFFS EVERY 4-6 HOURS AS NEEDED for shortness of breath) 8.5 Inhaler 1  . sacubitril-valsartan (ENTRESTO) 24-26 MG Take 1 tablet by mouth 2 (two) times daily. 60 tablet 3  . simvastatin (ZOCOR) 20 MG tablet Take 1 tablet by mouth every evening.     . sodium chloride (OCEAN) 0.65 % SOLN nasal spray Place 1-2 sprays into both nostrils daily as needed for congestion.    [name] spironolactone  (ALDACTONE) 25 MG tablet Take 1 tablet (25 mg total) by mouth daily. 30 tablet 4  . traZODone (DESYREL) 100 MG tablet Take 1 tablet (100 mg total) by mouth at bedtime. 15 tablet 0    No results found for this or any previous visit (from the past 48 hour(s)). No results found.  ROS: Pain with rom of the left upper extremity  Physical Exam:  Alert and oriented 75 y.o. male in no acute distress Cranial nerves 2-12 intact Cervical spine: full rom with no tenderness, nv intact distally Chest: active breath sounds bilaterally, no wheeze rhonchi or rales Heart: regular rate and rhythm, no murmur Abd: non tender non distended with active bowel sounds Hip is stable with rom  Left shoulder with moderate limitation with rom and weakness nv intact distally  No rashes   Assessment/Plan Assessment: left shoulder rotator cuff insufficiency  Plan: Patient will undergo a left reverse total shoulder by Dr. Veverly Fells at [facility]. Risks benefits and expectations were discussed with the patient. Patient understand risks, benefits and expectations and wishes to proceed.

## 2015-09-10 ENCOUNTER — Other Ambulatory Visit: Payer: Self-pay | Admitting: Cardiovascular Disease

## 2015-09-11 NOTE — Telephone Encounter (Signed)
Rx request sent to pharmacy.  

## 2015-09-13 NOTE — Pre-Procedure Instructions (Signed)
    DOMINYK LAW  09/13/2015      [id number] [address] [place] [address]Phone: [phone]: [phone]   Your procedure is scheduled on 09/22/15.  [facility]  Call this number if you have problems the morning of surgery:  [phone]   Remember:  Do not eat food or drink liquids after midnight.  Take these medicines the morning of surgery with A SIP OF WATER --all inhalers,proscar,prozac,hydrocodone,metoprolol   Do not wear jewelry, make-up or nail polish.  Do not wear lotions, powders, or perfumes.  You may wear deodorant.  Do not shave 48 hours prior to surgery.  Men may shave face and neck.  Do not bring valuables to the hospital.  [facility] belongings or [facility].  Contacts, dentures or bridgework may not be worn into surgery.  Leave your suitcase in the car.  After surgery it may be brought to your room.  For patients admitted to the hospital, discharge time will be determined by your treatment team.  Patients discharged the day of surgery will not be allowed to drive home.   Name and phone number of your driver:   Special instructions:    Please read over the following fact sheets that you were given. Pain Booklet, Coughing and Deep Breathing, Total Joint Packet, MRSA Information and Surgical Site Infection Prevention

## 2015-09-14 ENCOUNTER — Encounter (HOSPITAL_COMMUNITY): Payer: Self-pay

## 2015-09-14 ENCOUNTER — Encounter (HOSPITAL_COMMUNITY)
Admission: RE | Admit: 2015-09-14 | Discharge: 2015-09-14 | Disposition: A | Discharge status: Home / Self Care | Payer: Medicare Other | Source: Ambulatory Visit | Attending: Orthopedic Surgery | Admitting: Orthopedic Surgery

## 2015-09-14 DIAGNOSIS — Z79899 Other long term (current) drug therapy: Secondary | ICD-10-CM | POA: Insufficient documentation

## 2015-09-14 DIAGNOSIS — I428 Other cardiomyopathies: Secondary | ICD-10-CM | POA: Insufficient documentation

## 2015-09-14 DIAGNOSIS — Z01818 Encounter for other preprocedural examination: Secondary | ICD-10-CM | POA: Diagnosis present

## 2015-09-14 DIAGNOSIS — I110 Hypertensive heart disease with heart failure: Secondary | ICD-10-CM | POA: Insufficient documentation

## 2015-09-14 DIAGNOSIS — Z0183 Encounter for blood typing: Secondary | ICD-10-CM | POA: Insufficient documentation

## 2015-09-14 DIAGNOSIS — Z01812 Encounter for preprocedural laboratory examination: Secondary | ICD-10-CM | POA: Insufficient documentation

## 2015-09-14 DIAGNOSIS — D869 Sarcoidosis, unspecified: Secondary | ICD-10-CM | POA: Diagnosis not present

## 2015-09-14 DIAGNOSIS — J449 Chronic obstructive pulmonary disease, unspecified: Secondary | ICD-10-CM | POA: Diagnosis not present

## 2015-09-14 DIAGNOSIS — Z7951 Long term (current) use of inhaled steroids: Secondary | ICD-10-CM | POA: Insufficient documentation

## 2015-09-14 DIAGNOSIS — G4733 Obstructive sleep apnea (adult) (pediatric): Secondary | ICD-10-CM | POA: Insufficient documentation

## 2015-09-14 DIAGNOSIS — I509 Heart failure, unspecified: Secondary | ICD-10-CM | POA: Insufficient documentation

## 2015-09-14 DIAGNOSIS — E785 Hyperlipidemia, unspecified: Secondary | ICD-10-CM | POA: Diagnosis not present

## 2015-09-14 DIAGNOSIS — Z87891 Personal history of nicotine dependence: Secondary | ICD-10-CM | POA: Insufficient documentation

## 2015-09-14 DIAGNOSIS — M75102 Unspecified rotator cuff tear or rupture of left shoulder, not specified as traumatic: Secondary | ICD-10-CM | POA: Insufficient documentation

## 2015-09-14 LAB — CBC
HEMATOCRIT: 42.5 % (ref 39.0–52.0)
HEMOGLOBIN: 14 g/dL (ref 13.0–17.0)
MCH: 28.3 pg (ref 26.0–34.0)
MCHC: 32.9 g/dL (ref 30.0–36.0)
MCV: 86 fL (ref 78.0–100.0)
Platelets: 252 10*3/uL (ref 150–400)
RBC: 4.94 MIL/uL (ref 4.22–5.81)
RDW: 14.2 % (ref 11.5–15.5)
WBC: 9.7 10*3/uL (ref 4.0–10.5)

## 2015-09-14 LAB — BASIC METABOLIC PANEL
Anion gap: 9 (ref 5–15)
BUN: 20 mg/dL (ref 6–20)
CALCIUM: 9.1 mg/dL (ref 8.9–10.3)
CHLORIDE: 108 mmol/L (ref 101–111)
CO2: 21 mmol/L — AB (ref 22–32)
CREATININE: 0.96 mg/dL (ref 0.61–1.24)
GFR calc non Af Amer: >60 mL/min (ref >60)
GLUCOSE: 113 mg/dL — AB (ref 65–99)
Potassium: 4.2 mmol/L (ref 3.5–5.1)
Sodium: 138 mmol/L (ref 135–145)

## 2015-09-14 LAB — SURGICAL PCR SCREEN
MRSA, PCR: NEGATIVE
STAPHYLOCOCCUS AUREUS: NEGATIVE

## 2015-09-14 LAB — ABO/RH: ABO/RH(D): O POS

## 2015-09-14 LAB — TYPE AND SCREEN
ABO/RH(D): O POS
Antibody Screen: NEGATIVE

## 2015-09-15 NOTE — Progress Notes (Signed)
Anesthesia Chart Review:  Pt is a 75 year old male scheduled for L reverse total shoulder arthroplasty on 09/22/2015 with Dr. Veverly Fells.   PCP is Dr. Crist Infante. Cardiologist is Dr. Quay Burow. Pt was evaluated by Dr. Dani Gobble Croitoru who recommended ICD but pt has declined ICD. Pulmonologist is Dr. Chesley Mires.   PMH includes:  Coronary artery calcification on CT, nonischemic cardiomyopathy, CHF, LV dysfunction, OSA (no CPAP), COPD, subdural hematoma (2010), HTN, sarcoidosis, hyperlipidemia, PTSD. Uses 2L O2 at night. S/p cholecystectomy 11/08/14. Former smoker. BMI 33. S/p lumbar laminectomy 12/15/12.   Medications include: albuterol, symbicort, entresto, lasix, metoprolol, simvastatin, spironolactone.   Preoperative labs reviewed.    Chest x-ray 03/13/15 reviewed. Significantly improved bilateral lung opacities compared to prior exam. Minimal right basilar subsegmental atelectasis is noted, with mild residual left basilar opacity consistent with subsegmental atelectasis or residual pneumonia.  EKG 07/31/15: sinus rhythm with occasional PVCs, cannot rule out anterior infarct, age undetermined.   Echo 04/19/15:  - Left ventricle: The cavity size was normal. There was mild concentric hypertrophy. Systolic function was moderately to severely reduced. The estimated ejection fraction was in the range of 30% to 35%. There are multiple regional wall motion abnormalites noted but not all endocardial segments are adequately visualized. Cannot comment on inferolateral wall. Recommed definity contrast study to further evaluate. There is akinesis of the basal inferoseptal myocardium. There is severe hypokinesis of the mid-apicalinferoseptal myocardium. There is akinesis of the basal-midanteroseptal myocardium. There is severe hypokinesis of the apicalanterior myocardium. - Aorta: Aortic root dimension: 42 mm (ED). - Aortic root: The aortic root was mildly dilated.  Cardiac cath 01/23/15:   Ost RCA to Prox RCA  lesion, 40% stenosed.  Ost LAD to Prox LAD lesion, 40% stenosed.  Ost LM to LM lesion, 40% stenosed.  There is severe left ventricular systolic dysfunction.  Pt has cardiac clearance for surgery from Rosaria Ferries, PA (07/31/15) who notes "He is at increased risk because of his low EF, but there is no other workup or evaluation that needs to be done. He is at increased but acceptable risk for surgery. He is on Lasix, a beta blocker, Aldactone, and Entresto. He is to continue these medications in the perioperative period. Care will have to be taken during the surgery not to volume overloaded."  Dr. Halford Chessman has cleared pt for surgery (07/13/15) noting "he is at higher, but not prohibitive, risk for perioperative pulmonary complications. He should continue inhaler therapy during perioperative period. He should have close monitoring of his oxygenation during post operative period. He might need monitoring in step down or ICU after surgery. He has not been on prednisone for sarcoidosis recently >> should not need stress dose steroids"  If no changes, I anticipate pt can proceed with surgery as scheduled.   Willeen Cass, FNP-BC [facility]iology Phone: [phone] 09/15/2015 12:17 PM

## 2015-09-18 ENCOUNTER — Telehealth: Payer: Self-pay | Admitting: Pulmonary Disease

## 2015-09-18 NOTE — Telephone Encounter (Signed)
Called spoke with pt. He was calling to let Dr. Halford Chessman know he is scheduled for shoulder surgery on 09/22/15. FYI to Dr. Halford Chessman.

## 2015-09-18 NOTE — Telephone Encounter (Signed)
[phone] calling back

## 2015-09-18 NOTE — Telephone Encounter (Signed)
lmtcb X1 for pt  

## 2015-09-20 NOTE — Telephone Encounter (Signed)
Noted  

## 2015-09-22 ENCOUNTER — Inpatient Hospital Stay (HOSPITAL_COMMUNITY): Payer: Medicare Other | Admitting: Anesthesiology

## 2015-09-22 ENCOUNTER — Inpatient Hospital Stay (HOSPITAL_COMMUNITY): Payer: Medicare Other | Admitting: Emergency Medicine

## 2015-09-22 ENCOUNTER — Inpatient Hospital Stay (HOSPITAL_COMMUNITY)
Admission: RE | Admit: 2015-09-22 | Discharge: 2015-09-24 | DRG: 483 | Disposition: A | Discharge status: Home / Self Care | Payer: Medicare Other | Source: Ambulatory Visit | Attending: Orthopedic Surgery | Admitting: Orthopedic Surgery

## 2015-09-22 ENCOUNTER — Encounter (HOSPITAL_COMMUNITY)
Admission: RE | Disposition: A | Discharge status: Home / Self Care | Payer: Self-pay | Source: Ambulatory Visit | Attending: Orthopedic Surgery

## 2015-09-22 ENCOUNTER — Encounter (HOSPITAL_COMMUNITY): Payer: Self-pay | Admitting: General Practice

## 2015-09-22 ENCOUNTER — Inpatient Hospital Stay (HOSPITAL_COMMUNITY): Payer: Medicare Other

## 2015-09-22 DIAGNOSIS — F329 Major depressive disorder, single episode, unspecified: Secondary | ICD-10-CM | POA: Diagnosis present

## 2015-09-22 DIAGNOSIS — I110 Hypertensive heart disease with heart failure: Secondary | ICD-10-CM | POA: Diagnosis present

## 2015-09-22 DIAGNOSIS — Z96612 Presence of left artificial shoulder joint: Secondary | ICD-10-CM

## 2015-09-22 DIAGNOSIS — Z9981 Dependence on supplemental oxygen: Secondary | ICD-10-CM

## 2015-09-22 DIAGNOSIS — Z79899 Other long term (current) drug therapy: Secondary | ICD-10-CM

## 2015-09-22 DIAGNOSIS — Z888 Allergy status to other drugs, medicaments and biological substances status: Secondary | ICD-10-CM

## 2015-09-22 DIAGNOSIS — M549 Dorsalgia, unspecified: Secondary | ICD-10-CM | POA: Diagnosis present

## 2015-09-22 DIAGNOSIS — Z7951 Long term (current) use of inhaled steroids: Secondary | ICD-10-CM | POA: Diagnosis not present

## 2015-09-22 DIAGNOSIS — J449 Chronic obstructive pulmonary disease, unspecified: Secondary | ICD-10-CM | POA: Diagnosis present

## 2015-09-22 DIAGNOSIS — Z87891 Personal history of nicotine dependence: Secondary | ICD-10-CM | POA: Diagnosis not present

## 2015-09-22 DIAGNOSIS — F4310 Post-traumatic stress disorder, unspecified: Secondary | ICD-10-CM | POA: Diagnosis present

## 2015-09-22 DIAGNOSIS — I5043 Acute on chronic combined systolic (congestive) and diastolic (congestive) heart failure: Secondary | ICD-10-CM | POA: Diagnosis present

## 2015-09-22 DIAGNOSIS — M75102 Unspecified rotator cuff tear or rupture of left shoulder, not specified as traumatic: Secondary | ICD-10-CM | POA: Diagnosis present

## 2015-09-22 DIAGNOSIS — K219 Gastro-esophageal reflux disease without esophagitis: Secondary | ICD-10-CM | POA: Diagnosis present

## 2015-09-22 DIAGNOSIS — M25512 Pain in left shoulder: Secondary | ICD-10-CM | POA: Diagnosis present

## 2015-09-22 DIAGNOSIS — G4733 Obstructive sleep apnea (adult) (pediatric): Secondary | ICD-10-CM | POA: Diagnosis present

## 2015-09-22 DIAGNOSIS — E785 Hyperlipidemia, unspecified: Secondary | ICD-10-CM | POA: Diagnosis present

## 2015-09-22 DIAGNOSIS — M19012 Primary osteoarthritis, left shoulder: Secondary | ICD-10-CM | POA: Diagnosis present

## 2015-09-22 DIAGNOSIS — Z96619 Presence of unspecified artificial shoulder joint: Secondary | ICD-10-CM

## 2015-09-22 DIAGNOSIS — X58XXXA Exposure to other specified factors, initial encounter: Secondary | ICD-10-CM | POA: Diagnosis present

## 2015-09-22 DIAGNOSIS — G8929 Other chronic pain: Secondary | ICD-10-CM | POA: Diagnosis present

## 2015-09-22 HISTORY — DX: Other chronic pain: G89.29

## 2015-09-22 HISTORY — PX: REVERSE TOTAL SHOULDER ARTHROPLASTY: SHX2344

## 2015-09-22 HISTORY — DX: Low back pain: M54.5

## 2015-09-22 HISTORY — DX: Personal history of other medical treatment: Z92.89

## 2015-09-22 HISTORY — PX: REVERSE SHOULDER ARTHROPLASTY: SHX5054

## 2015-09-22 HISTORY — DX: Low back pain, unspecified: M54.50

## 2015-09-22 SURGERY — ARTHROPLASTY, SHOULDER, TOTAL, REVERSE
Anesthesia: Regional | Site: Shoulder | Laterality: Left

## 2015-09-22 MED ORDER — METOPROLOL TARTRATE 25 MG PO TABS
25.0000 mg | ORAL_TABLET | Freq: Two times a day (BID) | ORAL | Status: DC
  Administered 2015-09-22 – 2015-09-24 (×4): 25 mg via ORAL
  Filled 2015-09-22 (×5): qty 1

## 2015-09-22 MED ORDER — FENTANYL CITRATE (PF) 250 MCG/5ML IJ SOLN
INTRAMUSCULAR | Status: AC
  Filled 2015-09-22: qty 5

## 2015-09-22 MED ORDER — PROPOFOL 10 MG/ML IV BOLUS
INTRAVENOUS | Status: DC | PRN
  Administered 2015-09-22: 70 mg via INTRAVENOUS
  Administered 2015-09-22: 60 mg via INTRAVENOUS
  Administered 2015-09-22: 70 mg via INTRAVENOUS

## 2015-09-22 MED ORDER — HYDROCODONE-ACETAMINOPHEN 5-325 MG PO TABS
2.0000 | ORAL_TABLET | Freq: Two times a day (BID) | ORAL | Status: DC | PRN
  Administered 2015-09-22 – 2015-09-24 (×3): 2 via ORAL
  Filled 2015-09-22 (×3): qty 2

## 2015-09-22 MED ORDER — METHOCARBAMOL 500 MG PO TABS: 500.0000 mg | ORAL_TABLET | Freq: Three times a day (TID) | ORAL | Status: DC | PRN

## 2015-09-22 MED ORDER — METOCLOPRAMIDE HCL 5 MG/ML IJ SOLN: 5.0000 mg | Freq: Three times a day (TID) | INTRAMUSCULAR | Status: DC | PRN

## 2015-09-22 MED ORDER — LACTATED RINGERS IV SOLN
INTRAVENOUS | Status: DC
  Administered 2015-09-22 (×2): via INTRAVENOUS

## 2015-09-22 MED ORDER — BUPIVACAINE-EPINEPHRINE 0.25% -1:200000 IJ SOLN
INTRAMUSCULAR | Status: DC | PRN
  Administered 2015-09-22: 8 mL

## 2015-09-22 MED ORDER — SPIRONOLACTONE 25 MG PO TABS
25.0000 mg | ORAL_TABLET | Freq: Every day | ORAL | Status: DC
  Administered 2015-09-23 – 2015-09-24 (×2): 25 mg via ORAL
  Filled 2015-09-22 (×2): qty 1

## 2015-09-22 MED ORDER — POLYETHYLENE GLYCOL 3350 17 G PO PACK: 17.0000 g | PACK | Freq: Every day | ORAL | Status: DC | PRN

## 2015-09-22 MED ORDER — SIMVASTATIN 20 MG PO TABS
20.0000 mg | ORAL_TABLET | Freq: Every evening | ORAL | Status: DC
  Administered 2015-09-22 – 2015-09-23 (×2): 20 mg via ORAL
  Filled 2015-09-22 (×2): qty 1

## 2015-09-22 MED ORDER — MIDAZOLAM HCL 2 MG/2ML IJ SOLN
INTRAMUSCULAR | Status: AC
  Administered 2015-09-22: 1 mg
  Filled 2015-09-22: qty 2

## 2015-09-22 MED ORDER — ROCURONIUM BROMIDE 100 MG/10ML IV SOLN
INTRAVENOUS | Status: DC | PRN
  Administered 2015-09-22: 50 mg via INTRAVENOUS

## 2015-09-22 MED ORDER — FINASTERIDE 5 MG PO TABS
5.0000 mg | ORAL_TABLET | Freq: Every day | ORAL | Status: DC
  Administered 2015-09-23 – 2015-09-24 (×2): 5 mg via ORAL
  Filled 2015-09-22 (×2): qty 1

## 2015-09-22 MED ORDER — PHENYLEPHRINE HCL 10 MG/ML IJ SOLN
10.0000 mg | INTRAVENOUS | Status: DC | PRN
  Administered 2015-09-22: 15 ug/min via INTRAVENOUS

## 2015-09-22 MED ORDER — MENTHOL 3 MG MT LOZG: 1.0000 | LOZENGE | OROMUCOSAL | Status: DC | PRN

## 2015-09-22 MED ORDER — ONDANSETRON HCL 4 MG PO TABS: 4.0000 mg | ORAL_TABLET | Freq: Four times a day (QID) | ORAL | Status: DC | PRN

## 2015-09-22 MED ORDER — OXYCODONE HCL 5 MG PO TABS
5.0000 mg | ORAL_TABLET | ORAL | Status: DC | PRN
  Administered 2015-09-22 – 2015-09-24 (×8): 10 mg via ORAL
  Filled 2015-09-22 (×10): qty 2

## 2015-09-22 MED ORDER — LIDOCAINE HCL (CARDIAC) 20 MG/ML IV SOLN
INTRAVENOUS | Status: DC | PRN
  Administered 2015-09-22: 60 mg via INTRAVENOUS

## 2015-09-22 MED ORDER — FENTANYL CITRATE (PF) 100 MCG/2ML IJ SOLN
INTRAMUSCULAR | Status: AC
  Administered 2015-09-22: 100 ug
  Filled 2015-09-22: qty 2

## 2015-09-22 MED ORDER — SODIUM CHLORIDE 0.9 % IR SOLN
Status: DC | PRN
  Administered 2015-09-22: 1000 mL

## 2015-09-22 MED ORDER — OXYCODONE-ACETAMINOPHEN 5-325 MG PO TABS: 1.0000 | ORAL_TABLET | ORAL | Status: DC | PRN

## 2015-09-22 MED ORDER — PHENOL 1.4 % MT LIQD: 1.0000 | OROMUCOSAL | Status: DC | PRN

## 2015-09-22 MED ORDER — FUROSEMIDE 20 MG PO TABS
20.0000 mg | ORAL_TABLET | Freq: Every day | ORAL | Status: DC
  Administered 2015-09-23 – 2015-09-24 (×2): 20 mg via ORAL
  Filled 2015-09-22 (×2): qty 1

## 2015-09-22 MED ORDER — PROPOFOL 10 MG/ML IV BOLUS
INTRAVENOUS | Status: AC
  Filled 2015-09-22: qty 20

## 2015-09-22 MED ORDER — ADULT MULTIVITAMIN W/MINERALS CH
1.0000 | ORAL_TABLET | Freq: Every day | ORAL | Status: DC
  Administered 2015-09-23 – 2015-09-24 (×2): 1 via ORAL
  Filled 2015-09-22 (×2): qty 1

## 2015-09-22 MED ORDER — METHOCARBAMOL 500 MG PO TABS
500.0000 mg | ORAL_TABLET | Freq: Four times a day (QID) | ORAL | Status: DC | PRN
  Administered 2015-09-23: 500 mg via ORAL
  Filled 2015-09-22 (×3): qty 1

## 2015-09-22 MED ORDER — ALBUTEROL (5 MG/ML) CONTINUOUS INHALATION SOLN
3.0000 mL | INHALATION_SOLUTION | RESPIRATORY_TRACT | Status: DC | PRN
  Filled 2015-09-22: qty 20

## 2015-09-22 MED ORDER — METOCLOPRAMIDE HCL 5 MG PO TABS: 5.0000 mg | ORAL_TABLET | Freq: Three times a day (TID) | ORAL | Status: DC | PRN

## 2015-09-22 MED ORDER — BUPIVACAINE-EPINEPHRINE (PF) 0.5% -1:200000 IJ SOLN
INTRAMUSCULAR | Status: DC | PRN
  Administered 2015-09-22: 25 mL via PERINEURAL

## 2015-09-22 MED ORDER — ONDANSETRON HCL 4 MG/2ML IJ SOLN
INTRAMUSCULAR | Status: DC | PRN
  Administered 2015-09-22: 4 mg via INTRAVENOUS

## 2015-09-22 MED ORDER — FENTANYL CITRATE (PF) 100 MCG/2ML IJ SOLN
100.0000 ug | Freq: Once | INTRAMUSCULAR | Status: AC
  Administered 2015-09-22: 150 ug via INTRAVENOUS
  Administered 2015-09-22 (×2): 100 ug via INTRAVENOUS

## 2015-09-22 MED ORDER — CEFAZOLIN SODIUM-DEXTROSE 2-4 GM/100ML-% IV SOLN
2.0000 g | Freq: Four times a day (QID) | INTRAVENOUS | Status: AC
  Administered 2015-09-22 – 2015-09-23 (×3): 2 g via INTRAVENOUS
  Filled 2015-09-22 (×3): qty 100

## 2015-09-22 MED ORDER — CHLORHEXIDINE GLUCONATE 4 % EX LIQD: 60.0000 mL | Freq: Once | CUTANEOUS | Status: DC

## 2015-09-22 MED ORDER — MOMETASONE FURO-FORMOTEROL FUM 200-5 MCG/ACT IN AERO
2.0000 | INHALATION_SPRAY | Freq: Two times a day (BID) | RESPIRATORY_TRACT | Status: DC
  Administered 2015-09-23 – 2015-09-24 (×2): 2 via RESPIRATORY_TRACT
  Filled 2015-09-22: qty 8.8

## 2015-09-22 MED ORDER — EPHEDRINE SULFATE 50 MG/ML IJ SOLN
INTRAMUSCULAR | Status: DC | PRN
Start: 2015-09-22 — End: 2015-09-22
  Administered 2015-09-22: 10 mg via INTRAVENOUS

## 2015-09-22 MED ORDER — ONDANSETRON HCL 4 MG/2ML IJ SOLN: 4.0000 mg | Freq: Four times a day (QID) | INTRAMUSCULAR | Status: DC | PRN

## 2015-09-22 MED ORDER — FLUOXETINE HCL 20 MG PO CAPS
40.0000 mg | ORAL_CAPSULE | Freq: Every day | ORAL | Status: DC
  Administered 2015-09-23 – 2015-09-24 (×2): 40 mg via ORAL
  Filled 2015-09-22 (×2): qty 2

## 2015-09-22 MED ORDER — ALBUTEROL SULFATE (2.5 MG/3ML) 0.083% IN NEBU: 2.5000 mg | INHALATION_SOLUTION | RESPIRATORY_TRACT | Status: DC | PRN

## 2015-09-22 MED ORDER — HYDROMORPHONE HCL 1 MG/ML IJ SOLN: 1.0000 mg | INTRAMUSCULAR | Status: DC | PRN

## 2015-09-22 MED ORDER — BUPIVACAINE-EPINEPHRINE (PF) 0.25% -1:200000 IJ SOLN
INTRAMUSCULAR | Status: AC
  Filled 2015-09-22: qty 30

## 2015-09-22 MED ORDER — NEOSTIGMINE METHYLSULFATE 10 MG/10ML IV SOLN
INTRAVENOUS | Status: DC | PRN
  Administered 2015-09-22: 3 mg via INTRAVENOUS

## 2015-09-22 MED ORDER — GLYCOPYRROLATE 0.2 MG/ML IJ SOLN
INTRAMUSCULAR | Status: DC | PRN
  Administered 2015-09-22: 0.4 mg via INTRAVENOUS
  Administered 2015-09-22: 0.2 mg via INTRAVENOUS

## 2015-09-22 MED ORDER — ACETAMINOPHEN 325 MG PO TABS: 650.0000 mg | ORAL_TABLET | Freq: Four times a day (QID) | ORAL | Status: DC | PRN

## 2015-09-22 MED ORDER — DEXTROSE 5 % IV SOLN
500.0000 mg | Freq: Four times a day (QID) | INTRAVENOUS | Status: DC | PRN
  Filled 2015-09-22: qty 5

## 2015-09-22 MED ORDER — TRAZODONE HCL 100 MG PO TABS
100.0000 mg | ORAL_TABLET | Freq: Every day | ORAL | Status: DC
  Administered 2015-09-22 – 2015-09-23 (×2): 100 mg via ORAL
  Filled 2015-09-22 (×2): qty 1

## 2015-09-22 MED ORDER — DOCUSATE SODIUM 100 MG PO CAPS
100.0000 mg | ORAL_CAPSULE | Freq: Two times a day (BID) | ORAL | Status: DC
  Administered 2015-09-22 – 2015-09-24 (×3): 100 mg via ORAL
  Filled 2015-09-22 (×4): qty 1

## 2015-09-22 MED ORDER — MIDAZOLAM HCL 5 MG/ML IJ SOLN: 1.0000 mg | Freq: Once | INTRAMUSCULAR | Status: DC

## 2015-09-22 MED ORDER — SACUBITRIL-VALSARTAN 24-26 MG PO TABS
1.0000 | ORAL_TABLET | Freq: Two times a day (BID) | ORAL | Status: DC
  Administered 2015-09-22 – 2015-09-24 (×4): 1 via ORAL
  Filled 2015-09-22 (×5): qty 1

## 2015-09-22 MED ORDER — SODIUM CHLORIDE 0.9 % IV SOLN
INTRAVENOUS | Status: DC
  Administered 2015-09-22 – 2015-09-23 (×2): via INTRAVENOUS

## 2015-09-22 MED ORDER — ACETAMINOPHEN 650 MG RE SUPP: 650.0000 mg | Freq: Four times a day (QID) | RECTAL | Status: DC | PRN

## 2015-09-22 MED ORDER — SALINE SPRAY 0.65 % NA SOLN: 1.0000 | Freq: Every day | NASAL | Status: DC | PRN

## 2015-09-22 MED ORDER — CEFAZOLIN SODIUM-DEXTROSE 2-4 GM/100ML-% IV SOLN
2.0000 g | INTRAVENOUS | Status: AC
  Administered 2015-09-22: 2 g via INTRAVENOUS
  Filled 2015-09-22: qty 100

## 2015-09-22 MED ORDER — FENTANYL CITRATE (PF) 250 MCG/5ML IJ SOLN
INTRAMUSCULAR | Status: DC | PRN
  Administered 2015-09-22: 150 ug via INTRAVENOUS

## 2015-09-22 SURGICAL SUPPLY — 72 items
BIT DRILL 170X2.5X (BIT) IMPLANT
BIT DRILL 5/64X5 DISP (BIT) ×3 IMPLANT
BIT DRL 170X2.5X (BIT)
BLADE SAG 18X100X1.27 (BLADE) ×3 IMPLANT
CAPT SHLDR REVTOTAL 1 ×2 IMPLANT
CEMENT BONE DEPUY (Cement) ×2 IMPLANT
CLOSURE WOUND 1/2 X4 (GAUZE/BANDAGES/DRESSINGS) ×1
COVER SURGICAL LIGHT HANDLE (MISCELLANEOUS) ×3 IMPLANT
DRAPE IMP U-DRAPE 54X76 (DRAPES) ×6 IMPLANT
DRAPE INCISE IOBAN 66X45 STRL (DRAPES) ×3 IMPLANT
DRAPE ORTHO SPLIT 77X108 STRL (DRAPES) ×6
DRAPE SURG ORHT 6 SPLT 77X108 (DRAPES) ×2 IMPLANT
DRAPE U-SHAPE 47X51 STRL (DRAPES) ×3 IMPLANT
DRAPE X-RAY CASS 24X20 (DRAPES) IMPLANT
DRILL 2.5 (BIT)
DRSG ADAPTIC 3X8 NADH LF (GAUZE/BANDAGES/DRESSINGS) ×3 IMPLANT
DRSG PAD ABDOMINAL 8X10 ST (GAUZE/BANDAGES/DRESSINGS) ×3 IMPLANT
DURAPREP 26ML APPLICATOR (WOUND CARE) ×3 IMPLANT
ELECT BLADE 4.0 EZ CLEAN MEGAD (MISCELLANEOUS) ×3
ELECT NDL TIP 2.8 STRL (NEEDLE) ×1 IMPLANT
ELECT NEEDLE TIP 2.8 STRL (NEEDLE) ×3 IMPLANT
ELECT REM PT RETURN 9FT ADLT (ELECTROSURGICAL) ×3
ELECTRODE BLDE 4.0 EZ CLN MEGD (MISCELLANEOUS) ×1 IMPLANT
ELECTRODE REM PT RTRN 9FT ADLT (ELECTROSURGICAL) ×1 IMPLANT
GAUZE SPONGE 4X4 12PLY STRL (GAUZE/BANDAGES/DRESSINGS) ×3 IMPLANT
GLOVE BIOGEL PI ORTHO PRO 7.5 (GLOVE) ×2
GLOVE BIOGEL PI ORTHO PRO SZ8 (GLOVE) ×2
GLOVE ORTHO TXT STRL SZ7.5 (GLOVE) ×3 IMPLANT
GLOVE PI ORTHO PRO STRL 7.5 (GLOVE) ×1 IMPLANT
GLOVE PI ORTHO PRO STRL SZ8 (GLOVE) ×1 IMPLANT
GLOVE SURG ORTHO 8.5 STRL (GLOVE) ×3 IMPLANT
GOWN STRL REUS W/ TWL LRG LVL3 (GOWN DISPOSABLE) ×1 IMPLANT
GOWN STRL REUS W/ TWL XL LVL3 (GOWN DISPOSABLE) ×2 IMPLANT
GOWN STRL REUS W/TWL LRG LVL3 (GOWN DISPOSABLE) ×3
GOWN STRL REUS W/TWL XL LVL3 (GOWN DISPOSABLE) ×6
HANDPIECE INTERPULSE COAX TIP (DISPOSABLE)
KIT BASIN OR (CUSTOM PROCEDURE TRAY) ×3 IMPLANT
KIT ROOM TURNOVER OR (KITS) ×3 IMPLANT
MANIFOLD NEPTUNE II (INSTRUMENTS) ×3 IMPLANT
NDL 1/2 CIR MAYO (NEEDLE) ×1 IMPLANT
NEEDLE 1/2 CIR MAYO (NEEDLE) ×3 IMPLANT
NEEDLE HYPO 25GX1X1/2 BEV (NEEDLE) ×3 IMPLANT
NS IRRIG 1000ML POUR BTL (IV SOLUTION) ×3 IMPLANT
PACK SHOULDER (CUSTOM PROCEDURE TRAY) ×3 IMPLANT
PAD ARMBOARD 7.5X6 YLW CONV (MISCELLANEOUS) ×6 IMPLANT
PIN GUIDE 1.2 (PIN) IMPLANT
PIN GUIDE GLENOPHERE 1.5MX300M (PIN) IMPLANT
PIN METAGLENE 2.5 (PIN) IMPLANT
SET HNDPC FAN SPRY TIP SCT (DISPOSABLE) IMPLANT
SLING ARM LRG ADULT FOAM STRAP (SOFTGOODS) ×2 IMPLANT
SLING ARM MED ADULT FOAM STRAP (SOFTGOODS) IMPLANT
SPONGE LAP 18X18 X RAY DECT (DISPOSABLE) IMPLANT
SPONGE LAP 4X18 X RAY DECT (DISPOSABLE) ×3 IMPLANT
STRIP CLOSURE SKIN 1/2X4 (GAUZE/BANDAGES/DRESSINGS) ×2 IMPLANT
SUCTION FRAZIER HANDLE 10FR (MISCELLANEOUS) ×2
SUCTION TUBE FRAZIER 10FR DISP (MISCELLANEOUS) ×1 IMPLANT
SUT FIBERWIRE #2 38 T-5 BLUE (SUTURE) ×6
SUT MNCRL AB 4-0 PS2 18 (SUTURE) ×3 IMPLANT
SUT VIC AB 2-0 CT1 27 (SUTURE) ×3
SUT VIC AB 2-0 CT1 TAPERPNT 27 (SUTURE) ×1 IMPLANT
SUT VICRYL 0 CT 1 36IN (SUTURE) ×3 IMPLANT
SUTURE FIBERWR #2 38 T-5 BLUE (SUTURE) ×2 IMPLANT
SYR CONTROL 10ML LL (SYRINGE) ×3 IMPLANT
TAPE CLOTH SURG 6X10 WHT LF (GAUZE/BANDAGES/DRESSINGS) ×2 IMPLANT
TOWEL OR 17X24 6PK STRL BLUE (TOWEL DISPOSABLE) ×3 IMPLANT
TOWEL OR 17X26 10 PK STRL BLUE (TOWEL DISPOSABLE) ×3 IMPLANT
TOWER CARTRIDGE SMART MIX (DISPOSABLE) IMPLANT
TRAY FOLEY CATH 16FRSI W/METER (SET/KITS/TRAYS/PACK) IMPLANT
TUBE CONNECTING 12'X1/4 (SUCTIONS) ×1
TUBE CONNECTING 12X1/4 (SUCTIONS) ×1 IMPLANT
WATER STERILE IRR 1000ML POUR (IV SOLUTION) ×3 IMPLANT
YANKAUER SUCT BULB TIP NO VENT (SUCTIONS) ×5 IMPLANT

## 2015-09-22 NOTE — Progress Notes (Signed)
Utilization review completed.  

## 2015-09-22 NOTE — Anesthesia Postprocedure Evaluation (Signed)
Anesthesia Post Note  Patient: Travis Ferguson  Procedure(s) Performed: Procedure(s) (LRB): LEFT REVERSE TOTAL SHOULDER ARTHROPLASTY (Left)  Patient location during evaluation: PACU Anesthesia Type: General and Regional Level of consciousness: awake Pain management: pain level controlled Vital Signs Assessment: post-procedure vital signs reviewed and stable Respiratory status: spontaneous breathing and respiratory function stable Cardiovascular status: stable Postop Assessment: no signs of nausea or vomiting Anesthetic complications: no    Last Vitals:  Filed Vitals:   09/22/15 1550 09/22/15 1609  BP: 98/63 95/59  Pulse: 82 79  Temp:  36.3 C  Resp: 14 15    Last Pain:  Filed Vitals:   09/22/15 1611  PainSc: 0-No pain                 Starlynn Klinkner

## 2015-09-22 NOTE — Brief Op Note (Signed)
09/22/2015  2:15 PM  PATIENT:  Travis Ferguson  75 y.o. male  PRE-OPERATIVE DIAGNOSIS:  left shoulder Rotator Cuff Insufficiency  POST-OPERATIVE DIAGNOSIS:  left shoulder Rotator Cuff Insufficiency  PROCEDURE:  Procedure(s): LEFT REVERSE TOTAL SHOULDER ARTHROPLASTY (Left)  DePuy Delta Xtend  SURGEON:  Surgeon(s) and Role:    * Netta Cedars, MD - Primary  PHYSICIAN ASSISTANT:   ASSISTANTS: Ventura Bruns, PA-C   ANESTHESIA:   regional and general  EBL:  Total I/O In: 1000 [I.V.:1000] Out: -   BLOOD ADMINISTERED:none  DRAINS: none   LOCAL MEDICATIONS USED:  MARCAINE     SPECIMEN:  No Specimen  DISPOSITION OF SPECIMEN:  N/A  COUNTS:  YES  TOURNIQUET:  * No tourniquets in log *  DICTATION: .Other Dictation: Dictation Number [id number]  PLAN OF CARE: Admit to inpatient   PATIENT DISPOSITION:  PACU - hemodynamically stable.   Delay start of Pharmacological VTE agent (>24hrs) due to surgical blood loss or risk of bleeding: not applicable

## 2015-09-22 NOTE — Interval H&P Note (Signed)
History and Physical Interval Note:  09/22/2015 11:41 AM  Travis Ferguson  has presented today for surgery, with the diagnosis of left shoulder Rotator Cuff Insufficiency  The various methods of treatment have been discussed with the patient and family. After consideration of risks, benefits and other options for treatment, the patient has consented to  Procedure(s): LEFT REVERSE TOTAL SHOULDER ARTHROPLASTY (Left) as a surgical intervention .  The patient's history has been reviewed, patient examined, no change in status, stable for surgery.  I have reviewed the patient's chart and labs.  Questions were answered to the patient's satisfaction.     Donyell Ding,STEVEN R

## 2015-09-22 NOTE — Anesthesia Procedure Notes (Addendum)
Anesthesia Regional Block:  Interscalene brachial plexus block  Pre-Anesthetic Checklist: ,, timeout performed, Correct Patient, Correct Site, Correct Laterality, Correct Procedure, Correct Position, site marked, Risks and benefits discussed,  Surgical consent,  Pre-op evaluation,  At surgeon's request and post-op pain management  Laterality: Upper and Left  Prep: chloraprep       Needles:  Injection technique: Single-shot  Needle Type: Echogenic Stimulator Needle          Additional Needles:  Procedures: ultrasound guided (picture in chart) Interscalene brachial plexus block Narrative:  Injection made incrementally with aspirations every 5 mL.  Performed by: Personally  Anesthesiologist: Oleta Mouse  Additional Notes: H+P and labs reviewed, risks and benefits discussed with patient, procedure tolerated well without complications   Procedure Name: Intubation Date/Time: 09/22/2015 12:28 PM Performed by: Mariea Clonts Pre-anesthesia Checklist: Emergency Drugs available, Timeout performed, Suction available, Patient identified and Patient being monitored Patient Re-evaluated:Patient Re-evaluated prior to inductionOxygen Delivery Method: Circle system utilized Preoxygenation: Pre-oxygenation with 100% oxygen Intubation Type: IV induction Ventilation: Mask ventilation without difficulty and Oral airway inserted - appropriate to patient size Laryngoscope Size: Miller and 3 Grade View: Grade II Tube type: Oral Tube size: 8.0 mm Number of attempts: 1 Placement Confirmation: ETT inserted through vocal cords under direct vision,  breath sounds checked- equal and bilateral and positive ETCO2 Tube secured with: Tape Dental Injury: Teeth and Oropharynx as per pre-operative assessment

## 2015-09-22 NOTE — Discharge Instructions (Signed)
Ice to the shoulder as much as you can.  Keep the arm propped so that you can see your elbow( don't let the arm slide around behind you.)  Ok to remove the sling as you are able and move the arm as you can.  No heavy use of the arm yet.  Keep the incision clean and dry and covered, ok to get it wet in the shower in one week.  Follow up in the office in two weeks  [phone]

## 2015-09-22 NOTE — Anesthesia Preprocedure Evaluation (Signed)
Anesthesia Evaluation  Patient identified by MRN, date of birth, ID band Patient awake    Reviewed: Allergy & Precautions, NPO status , Patient's Chart, lab work & pertinent test results, reviewed documented beta blocker date and time   Airway Mallampati: III  TM Distance: >3 FB Neck ROM: Full    Dental  (+) Teeth Intact   Pulmonary shortness of breath and Long-Term Oxygen Therapy, sleep apnea , COPD, former smoker,     + decreased breath sounds      Cardiovascular hypertension, Pt. on medications and Pt. on home beta blockers (-) angina+ CAD, + Peripheral Vascular Disease and +CHF  (-) Past MI, (-) Cardiac Stents and (-) CABG  Rhythm:Regular     Neuro/Psych  Headaches, PSYCHIATRIC DISORDERS Anxiety Depression  Neuromuscular disease    GI/Hepatic Neg liver ROS, hiatal hernia, GERD  Controlled,  Endo/Other  Morbid obesity  Renal/GU negative Renal ROS     Musculoskeletal  (+) Arthritis ,   Abdominal   Peds  Hematology negative hematology ROS (+)   Anesthesia Other Findings sarcoidosis  Reproductive/Obstetrics                             Anesthesia Physical Anesthesia Plan  ASA: III  Anesthesia Plan: General and Regional   Post-op Pain Management:    Induction: Intravenous  Airway Management Planned: Oral ETT  Additional Equipment: None  Intra-op Plan:   Post-operative Plan: Extubation in OR  Informed Consent: I have reviewed the patients History and Physical, chart, labs and discussed the procedure including the risks, benefits and alternatives for the proposed anesthesia with the patient or authorized representative who has indicated his/her understanding and acceptance.   Dental advisory given  Plan Discussed with: CRNA and Surgeon  Anesthesia Plan Comments:         Anesthesia Quick Evaluation

## 2015-09-22 NOTE — Transfer of Care (Signed)
Immediate Anesthesia Transfer of Care Note  Patient: Travis Ferguson  Procedure(s) Performed: Procedure(s): LEFT REVERSE TOTAL SHOULDER ARTHROPLASTY (Left)  Patient Location: PACU  Anesthesia Type:GA combined with regional for post-op pain  Level of Consciousness: awake, alert  and oriented  Airway & Oxygen Therapy: Patient Spontanous Breathing and Patient connected to nasal cannula oxygen  Post-op Assessment: Report given to RN, Post -op Vital signs reviewed and stable and Patient moving all extremities X 4  Post vital signs: Reviewed and stable  Last Vitals:  Filed Vitals:   09/22/15 1155 09/22/15 1200  BP: 148/95 161/106  Pulse: 73 75  Temp:    Resp: 17 17    Complications: No apparent anesthesia complications

## 2015-09-23 LAB — BASIC METABOLIC PANEL
Anion gap: 6 (ref 5–15)
BUN: 10 mg/dL (ref 6–20)
CALCIUM: 8.7 mg/dL — AB (ref 8.9–10.3)
CO2: 24 mmol/L (ref 22–32)
CREATININE: 0.85 mg/dL (ref 0.61–1.24)
Chloride: 106 mmol/L (ref 101–111)
GFR calc non Af Amer: >60 mL/min (ref >60)
Glucose, Bld: 108 mg/dL — ABNORMAL HIGH (ref 65–99)
Potassium: 4.2 mmol/L (ref 3.5–5.1)
SODIUM: 136 mmol/L (ref 135–145)

## 2015-09-23 LAB — HEMOGLOBIN AND HEMATOCRIT, BLOOD
HEMATOCRIT: 35.6 % — AB (ref 39.0–52.0)
HEMOGLOBIN: 11.9 g/dL — AB (ref 13.0–17.0)

## 2015-09-23 NOTE — Evaluation (Signed)
Occupational Therapy Evaluation Patient Details Name: Travis Ferguson MRN: [id number] DOB: 01-05-1941 Today's Date: 09/23/2015    History of Present Illness Pt. 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 Pt. PMH: COPD, depression, PTSD, cervical disc dx, subdural hematoma with sx in 2010, HTN, lumbar lami 2014, chronic broncitis, anxiety, sarcodosis.    Clinical Impression   Pt. Was having 10/10 pain today and limited therapy. Pt. Was not able to tolerate pendulums. Pt. Was ed on proper technique to perform pendulums and handout given. Pt. Was able to tolerate FF of L SHLD to 15 degrees, ABD 20 degrees and ER to midline, all performed passively. Pt. Was ed on AROM for elbow, wrist, and digits. Pt. Required AAROM for performing L SHLD elbow flex/ext, and wrist flex/ext to full range. Pt. Able to perform AROM of L digits flex/ext. Pt. And wife need further training for all exercises and pendulums. Pt. Has hx of back problems and was lying in bed and using cross leg technique for dressing. Pt. May need ed on use of AE to assist with dressing or wife may need to assist. Pt. States that wife has bad knees. Pt. Wife will need instruction on how to assist with ADLs, sling management and exercises.     Follow Up Recommendations  No OT follow up    Equipment Recommendations  None recommended by OT    Recommendations for Other Services       Precautions / Restrictions Precautions Precautions: Shoulder Type of Shoulder Precautions:  (No active SHLD motion, pendulum and PROM only. sling sleep) Shoulder Interventions: Shoulder sling/immobilizer;For comfort Precaution Booklet Issued: Yes (comment) Precaution Comments: Pt. given SHLD protocol and  pendulum handout. Restrictions Weight Bearing Restrictions: Yes LUE Weight Bearing: Non weight bearing      Mobility Bed Mobility Overal bed mobility: Needs Assistance Bed Mobility: Supine to Sit     Supine to sit: Min assist        Transfers Overall transfer level: Needs assistance   Transfers: Stand Pivot Transfers   Stand pivot transfers: Min guard            Balance                                            ADL Overall ADL's : Needs assistance/impaired Eating/Feeding: Set up       Upper Body Bathing: Maximal assistance Upper Body Bathing Details (indicate cue type and reason):  (ed on SHLD protocol) Lower Body Bathing: Minimal assistance   Upper Body Dressing : Maximal assistance Upper Body Dressing Details (indicate cue type and reason): ed on SHLD protocol Lower Body Dressing: Moderate assistance;Maximal assistance   Toilet Transfer: Minimal assistance   Toileting- Clothing Manipulation and Hygiene: Moderate assistance       Functional mobility during ADLs: Minimal assistance General ADL Comments: Pt. provided sheet but will need demo with wife for compliance.     Vision     Perception     Praxis      Pertinent Vitals/Pain Pain Assessment: 0-10 Pain Score: 10-Worst pain ever Pain Location:  (L SHLD ) Pain Descriptors / Indicators: Burning;Constant Pain Intervention(s): Limited activity within patient's tolerance;Monitored during session;Premedicated before session     Hand Dominance Right   Extremity/Trunk Assessment Upper Extremity Assessment Upper Extremity Assessment: LUE deficits/detail LUE Deficits / Details:  (Pendulums L, PROM FF 90,  ABD 60, ER 30. AROM elbow - digits.)           Communication Communication Communication: No difficulties   Cognition Arousal/Alertness: Awake/alert Behavior During Therapy: WFL for tasks assessed/performed Overall Cognitive Status: Within Functional Limits for tasks assessed                     General Comments       Exercises Exercises: Shoulder     Shoulder Instructions Shoulder Instructions Donning/doffing shirt without moving shoulder: Maximal assistance Method for sponge bathing  under operated UE: Maximal assistance Donning/doffing sling/immobilizer: Maximal assistance Correct positioning of sling/immobilizer: Maximal assistance Pendulum exercises (written home exercise program): Maximal assistance ROM for elbow, wrist and digits of operated UE: Maximal assistance Sling wearing schedule (on at all times/off for ADL's): Maximal assistance Proper positioning of operated UE when showering: Maximal assistance Positioning of UE while sleeping: Maximal assistance    Home Living Family/patient expects to be discharged to:: Private residence Living Arrangements: Spouse/significant other Available Help at Discharge: Available 24 hours/day Type of Home: House Home Access: Stairs to enter [employer] of Steps:  (5) Entrance Stairs-Rails: Can reach both Home Layout: One level     Bathroom Shower/Tub: Tub/shower unit Shower/tub characteristics: Architectural technologist: Handicapped height Bathroom Accessibility: Yes How Accessible: Accessible via walker Home Equipment: [place] - single point;Bedside commode;Shower seat;Grab bars - toilet;Grab bars - tub/shower;Hand held shower head          Prior Functioning/Environment Level of Independence: Independent             OT Diagnosis: Acute pain   OT Problem List: Decreased strength;Decreased range of motion;Decreased activity tolerance;Decreased knowledge of use of DME or AE;Decreased knowledge of precautions;Impaired UE functional use;Pain   OT Treatment/Interventions: Self-care/ADL training;Therapeutic exercise;DME and/or AE instruction;Patient/family education    OT Goals(Current goals can be found in the care plan section) Acute Rehab OT Goals Patient Stated Goal:  (To get SHLD better, decrease pain  and go home.) OT Goal Formulation: With patient/family Time For Goal Achievement: 10/07/15 Potential to Achieve Goals: Good ADL Goals Pt Will Perform Upper Body Bathing: with supervision;sitting Pt  Will Perform Lower Body Bathing: with supervision;with adaptive equipment;sit to/from stand Pt Will Perform Upper Body Dressing: with mod assist;sitting;with caregiver independent in assisting Pt Will Perform Lower Body Dressing: with mod assist;with caregiver independent in assisting;with adaptive equipment;sit to/from stand Pt Will Transfer to Toilet: with modified independence;ambulating;grab bars Pt Will Perform Toileting - Clothing Manipulation and hygiene: with supervision;sit to/from stand Pt Will Perform Tub/Shower Transfer: with min guard assist;with caregiver independent in assisting;ambulating;shower seat;grab bars Pt/caregiver will Perform Home Exercise Program: Left upper extremity;With minimal assist;With written HEP provided  OT Frequency: Min 3X/week   Barriers to D/C:            Co-evaluation              End of Session Equipment Utilized During Treatment: Oxygen  Activity Tolerance: Patient limited by pain Patient left: in chair;with call bell/phone within reach   Time: 0850-1000 OT Time Calculation (min): 70 min Charges:  OT General Charges $OT Visit: 1 Procedure OT Evaluation $OT Eval Moderate Complexity: 1 Procedure OT Treatments $Self Care/Home Management : 8-22 mins $Therapeutic Exercise: 23-37 mins G-Codes:    Kisha Messman 2015/10/19, 10:54 AM

## 2015-09-23 NOTE — Progress Notes (Signed)
Subjective: 1 Day Post-Op Procedure(s) (LRB): LEFT REVERSE TOTAL SHOULDER ARTHROPLASTY (Left)  Patient reports pain as moderate to severe.  Tolerating POs well.  Admits to BM.  Denies fever, chills, N/V.  Reports that he does not think he can go home today due to pain.  Objective:   VITALS:  Temp:  [97.3 F (36.3 C)-98.6 F (37 C)] 98.6 F (37 C) (04/01 0518) Pulse Rate:  [32-99] 78 (04/01 0518) Resp:  [13-20] 16 (04/01 0518) BP: (87-161)/(59-106) 112/74 mmHg (04/01 0518) SpO2:  [92 %-98 %] 98 % (04/01 0807)  General: WDWN patient in NAD. Psych:  Appropriate mood and affect. Neuro:  A&O x 3, Moving all extremities, sensation intact to light touch HEENT:  EOMs intact Chest:  Even non-labored respirations Skin: Dressing C/D/I, no rashes or lesions Extremities: warm/dry, mild edema, no erythema, or echymosis.  No lymphadenopathy. Pulses: Radial 2+ MSK:  ROM: Full wrist ROM, MMT: 5/5 grip strength    LABS  Recent Labs  09/23/15 0615  HGB 11.9*    Recent Labs  09/23/15 0615  NA 136  K 4.2  CL 106  CO2 24  BUN 10  CREATININE 0.85  GLUCOSE 108*   No results for input(s): LABPT, INR in the last 72 hours.   Assessment/Plan: 1 Day Post-Op Procedure(s) (LRB): LEFT REVERSE TOTAL SHOULDER ARTHROPLASTY (Left)  Up with therapy  Plan for potential D/C tomorrow if pain better controlled. NWB LUE  Mechele Claude, PA-C, [facility] Office:  [phone]

## 2015-09-23 NOTE — Op Note (Signed)
NAME:  DEANGELO, BERNS NO.:  [id number]  MEDICAL RECORD NO.:  [id number]  LOCATION:  [id number]                        FACILITY:  [facility]  PHYSICIAN:  Doran Heater. Veverly Fells, M.D. 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  PROCEDURE PERFORMED:  Left reverse total shoulder arthroplasty using DePuy Delta Xtend prosthesis.  ATTENDING SURGEON:  Doran Heater. Veverly Fells, M.D.  ASSISTANT:  Abbott Pao. Dixon, PA-C, who was scrubbed the entire procedure and necessary for satisfactory completion of surgery.  ANESTHESIA:  General anesthesia was used plus interscalene block.  ESTIMATED BLOOD LOSS:  150 mL.  FLUID REPLACEMENT:  1500 mL crystalloid.  INSTRUMENT COUNTS:  Correct.  COMPLICATIONS:  There were no complications.  ANTIBIOTICS:  Perioperative antibiotics were given.  INDICATIONS:  The patient is a 75 year old male with worsening left shoulder pain secondary to end-stage arthritis and rotator cuff insufficiency.  The patient has lost fixed fulcrum mechanics and has disabling shoulder pain and declining function despite conservative management, he presents now for operative treatment to restore function and eliminate pain to the shoulder.  Informed consent obtained.  DESCRIPTION OF PROCEDURE:  After an adequate level of anesthesia achieved, the patient was positioned in the modified beach-chair position and left shoulder was correctly identified and sterilely prepped and draped in usual manner.  Time-out was called.  We initiated surgery through a deltopectoral incision starting at the coracoid process extending down to the anterior humerus.  Dissection was down through the subcutaneous  tissues using Bovie.  Identified the cephalic vein, took it laterally to the deltoid, pectoralis taken medially. Conjoint tendon was identified and retracted medially.  We then released the remaining subscapularis tendon, which was thin and atrophied off the lesser tuberosity and tagged for protection of the axillary nerve.  We made sure that the axillary nerve was protected during this portion of procedure.  We released the inferior capsule off the inferior humeral neck, progressively externally rotating the humerus.  We then took suture and went through the biceps tendon and through the pectoralis tendon insertion, tenodesing the biceps tendon in appropriate tension. We then released the biceps in the remaining rotator cuff back to teres minor, which we preserved.  We removed that atrophic, scarred and damaged rotator cuff, but teres minor was preserved.  We extended the shoulder, externally rotated and we were able to enter the proximal humerus with a 6-mm reamer.  We reamed up to a size 12 all the way down and then a 14 more than halfway, so we knew we would be using a 14 stem, which does taper to a 12.  We then went ahead and placed our intramedullary guide and resected our humerus at 10 degrees of retroversion or humeral head.  We then removed the excess osteophytes with a rongeur and then did our metaphyseal preparation, which was a size 2 centered and reamed to the appropriate depth.  We then impacted the trial stem, made  sure that it fit appropriately and then removed the trial stem.  We then subluxed the humerus posteriorly, did a 360-degree capsular release and removal and then the glenoid labrum was removed. We removed the remaining cartilage, which there was not much on the glenoid face with the Cobb elevator.  We next placed our center guidepin, centered low on the glenoid and then reamed for the metaglene. We then drilled our central peg hole, impacted the metaglene  into position and pleased with its location.  We then placed 48 screw inferiorly, 42 proximally into the base of coracoid and then 24 locked anteriorly and a nonlocked 18 posteriorly.  We had excellent purchase and security with that metaglene, and then used a 42 standard glenosphere and screwed that in position and impacted it.  We were pleased with the location of the glenosphere, gave good clearance inferiorly, checked our axillary nerve again.  We then went ahead and selected the real components so as a 14 HA stem with a 2 centered HA proximal metaphyseal component, set on the 0 setting, placed in 10 degrees of retroversion.  We used just one pack of DePuy 1 cement, vacuum mixed and hand packed just to augment our stability as the patient's bone was not in good shape, quite osteoporotic.  We did not want to lose any initial fixation, so we did do one bag of cement.  We hand packed some down the canal, but not at the proximal portion where we would like the HA to be ingrown.  Once that was impacted into position, we reduced the shoulder with a trial 42+ 3, which was perfect tension both in external rotation, internal rotation and also negative sulcus.  We removed the trial component, placed the 42+ 3 real poly, impacted that in position on the humerus and then reduced the shoulder with a nice little snap.  We then irrigated the shoulder thoroughly, once again checked our axillary nerve, which was free and clear and then, we resected the remaining subscapularis tissue, which could not have been repaired.  We irrigated again and then closed the deltopectoral interval with 0 Vicryl suture followed by 2-0 Vicryl subcutaneous closure and 4-0 Monocryl for skin.  Steri-Strips applied followed by sterile dressing.  The patient tolerated the surgery well.     Doran Heater. Veverly Fells, M.D.     SRN/MEDQ  D:  09/22/2015  T:  09/23/2015  Job:  [id number]

## 2015-09-24 NOTE — Progress Notes (Addendum)
Occupational Therapy Treatment Patient Details Name: Travis Ferguson MRN: [id number] DOB: Apr 13, 1941 Today's Date: 09/24/2015    History of present illness Pt. 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 Pt. PMH: COPD, depression, PTSD, cervical disc dx, subdural hematoma with sx in 2010, HTN, lumbar lami 2014, chronic broncitis, anxiety, sarcodosis.    OT comments  Pt. Feeling better, reporting less pain today.  Able to complete pendulum exercises when he was unable to tolerate yesterday.  Wife and son present for family education.  Wife is primary caregiver and was able to verbalize and return demonstration of sling management and LUE exercises.  Pt. Eager for d/c home, requesting home today.    Follow Up Recommendations  No OT follow up    Equipment Recommendations  None recommended by OT    Recommendations for Other Services      Precautions / Restrictions Precautions Precautions: Shoulder Shoulder Interventions: Shoulder sling/immobilizer;For comfort Restrictions LUE Weight Bearing: Non weight bearing       Mobility Bed Mobility Overal bed mobility: Needs Assistance Bed Mobility: Supine to Sit     Supine to sit: Min assist     General bed mobility comments: max cues not to weight bear through L UE while trying to get oob, (exits on left side at home).  required min a to bring trunk upright, wife states she "could" do that but also offered he sleep in the other bed at their house so he could exit on the right side  Transfers Overall transfer level: Needs assistance Equipment used: Straight cane Transfers: Sit to/from Stand;Stand Pivot Transfers Sit to Stand: Min guard Stand pivot transfers: Min guard            Balance                                   ADL Overall ADL's : Needs assistance/impaired           Upper Body Bathing Details (indicate cue type and reason): wife to assist as needed   Lower Body Bathing Details  (indicate cue type and reason): wife to assist as needed   Upper Body Dressing Details (indicate cue type and reason): wife able to don sling for pt.    Lower Body Dressing Details (indicate cue type and reason): pt. states he has "his own way of doing things" also reports and wife confirms she will help as needed               General ADL Comments: provided demonstration of all indicated exercises and sling management information with wife, she verbalized understanding and was able to return demo      Vision                     Perception     Praxis      Cognition   Behavior During Therapy: Anxious (interupting often and talking over therapist asst. repeating same questions over and over not listening for response) Overall Cognitive Status: Within Functional Limits for tasks assessed                       Extremity/Trunk Assessment               Exercises Shoulder Exercises Pendulum Exercise: 5 reps;Standing;Left Elbow Flexion: AAROM;Left;5 reps;Standing Elbow Extension: AAROM;Left;5 reps;Standing Wrist Flexion: AAROM;Left;5 reps;Standing Wrist Extension: AAROM;Left;5 reps;Standing Digit  Composite Flexion: AAROM;Left;5 reps;Standing Composite Extension: AAROM;Left;5 reps;Standing Donning/doffing sling/immobilizer: Caregiver independent with task;Patient able to independently direct caregiver Correct positioning of sling/immobilizer: Caregiver independent with task;Patient able to independently direct caregiver Pendulum exercises (written home exercise program): Caregiver independent with task;Patient able to independently direct caregiver ROM for elbow, wrist and digits of operated UE: Caregiver independent with task;Patient able to independently direct caregiver Proper positioning of operated UE when showering: Caregiver independent with task;Patient able to independently direct caregiver Positioning of UE while sleeping: Caregiver independent with  task;Patient able to independently direct caregiver   Shoulder Instructions Shoulder Instructions Donning/doffing sling/immobilizer: Caregiver independent with task;Patient able to independently direct caregiver Correct positioning of sling/immobilizer: Caregiver independent with task;Patient able to independently direct caregiver Pendulum exercises (written home exercise program): Caregiver independent with task;Patient able to independently direct caregiver ROM for elbow, wrist and digits of operated UE: Caregiver independent with task;Patient able to independently direct caregiver Proper positioning of operated UE when showering: Caregiver independent with task;Patient able to independently direct caregiver Positioning of UE while sleeping: Caregiver independent with task;Patient able to independently direct caregiver     General Comments      Pertinent Vitals/ Pain       Pain Assessment: No/denies pain  Home Living                                          Prior Functioning/Environment              Frequency Min 3X/week     Progress Toward Goals  OT Goals(current goals can now be found in the care plan section)  Progress towards OT goals: Progressing toward goals     Plan Discharge plan remains appropriate    Co-evaluation                 End of Session     Activity Tolerance Patient tolerated treatment well   Patient Left in chair;with call bell/phone within reach;with family/visitor present   Nurse Communication          Time: 9675-9163 OT Time Calculation (min): 29 min  Charges: OT General Charges $OT Visit: 1 Procedure OT Treatments $Self Care/Home Management : 8-22 mins $Therapeutic Exercise: 8-22 mins  Janice Coffin, COTA/L 09/24/2015, 10:56 AM

## 2015-09-24 NOTE — Progress Notes (Signed)
   Subjective:  Patient reports pain as mild to moderate.  No C/o. Wants to go home.  Objective:   VITALS:   Filed Vitals:   09/23/15 0807 09/23/15 1358 09/23/15 2238 09/24/15 0701  BP:  106/68 102/64 104/65  Pulse:  76 96 91  Temp:  98.4 F (36.9 C) 98.2 F (36.8 C) 98.7 F (37.1 C)  TempSrc:  Oral Oral Oral  Resp:  '16 16 16  '$ SpO2: 98% 98% 92% 91%    ABD soft Incision: dressing C/D/I + AIN/PIN/U. 2+ radial. SILT.   Lab Results  Component Value Date   WBC 9.7 09/14/2015   HGB 11.9* 09/23/2015   HCT 35.6* 09/23/2015   MCV 86.0 09/14/2015   PLT 252 09/14/2015   BMET    Component Value Date/Time   NA 136 09/23/2015 0615   K 4.2 09/23/2015 0615   CL 106 09/23/2015 0615   CO2 24 09/23/2015 0615   GLUCOSE 108* 09/23/2015 0615   BUN 10 09/23/2015 0615   CREATININE 0.85 09/23/2015 0615   CREATININE 1.00 05/29/2015 1516   CALCIUM 8.7* 09/23/2015 0615   GFRNONAA >60 09/23/2015 0615   GFRAA >60 09/23/2015 0615     Assessment/Plan: 2 Days Post-Op   Active Problems:   S/P shoulder replacement  Cont current care D/c home   Raygen Dahm, Alekai Pocock 09/24/2015, 12:04 PM   Rod Can, MD Cell [phone]

## 2015-09-25 ENCOUNTER — Encounter (HOSPITAL_COMMUNITY): Payer: Self-pay | Admitting: Orthopedic Surgery

## 2015-09-26 NOTE — Discharge Summary (Signed)
Physician Discharge Summary   Patient ID: Travis Ferguson MRN: [id number] DOB/AGE: [birth year] 75 y.o.  Admit date: 09/22/2015 Discharge date: 09/24/15  Admission Diagnoses:  Active Problems:   S/P shoulder replacement   Discharge Diagnoses:  Same   Surgeries: Procedure(s): LEFT REVERSE TOTAL SHOULDER ARTHROPLASTY on 09/22/2015   Consultants: PT/OT  Discharged Condition: Stable  Hospital Course: Travis Ferguson is an 75 y.o. male who was admitted 09/22/2015 with a chief complaint of No chief complaint on file. , and found to have a diagnosis of <principal problem not specified>.  They were brought to the operating room on 09/22/2015 and underwent the above named procedures.    The patient had an uncomplicated hospital course and was stable for discharge.  Recent vital signs:  Filed Vitals:   09/23/15 2238 09/24/15 0701  BP: 102/64 104/65  Pulse: 96 91  Temp: 98.2 F (36.8 C) 98.7 F (37.1 C)  Resp: 16 16    Recent laboratory studies:  Results for orders placed or performed during the hospital encounter of 09/22/15  Hemoglobin and hematocrit, blood  Result Value Ref Range   Hemoglobin 11.9 (L) 13.0 - 17.0 g/dL   HCT 35.6 (L) 39.0 - 72.0 %  Basic metabolic panel  Result Value Ref Range   Sodium 136 135 - 145 mmol/L   Potassium 4.2 3.5 - 5.1 mmol/L   Chloride 106 101 - 111 mmol/L   CO2 24 22 - 32 mmol/L   Glucose, Bld 108 (H) 65 - 99 mg/dL   BUN 10 6 - 20 mg/dL   Creatinine, Ser 0.85 0.61 - 1.24 mg/dL   Calcium 8.7 (L) 8.9 - 10.3 mg/dL   GFR calc non Af Amer >60 >60 mL/min   GFR calc Af Amer >60 >60 mL/min   Anion gap 6 5 - 15    Discharge Medications:     Medication List    TAKE these medications        budesonide-formoterol 160-4.5 MCG/ACT inhaler  Commonly known as:  SYMBICORT  Inhale 2 puffs into the lungs at bedtime.     ENTRESTO 24-26 MG  Generic drug:  sacubitril-valsartan  TAKE 1 TABLET BY MOUTH 2 (TWO) TIMES DAILY.     finasteride 5 MG tablet    Commonly known as:  PROSCAR  Take 5 mg by mouth daily.     FLUoxetine 40 MG capsule  Commonly known as:  PROZAC  Take 40 mg by mouth daily with breakfast.     furosemide 20 MG tablet  Commonly known as:  LASIX  Take 1 tablet (20 mg total) by mouth daily.     HYDROcodone-acetaminophen 5-325 MG tablet  Commonly known as:  NORCO/VICODIN  Take 2 tablets by mouth 2 (two) times daily as needed (for pain).     methocarbamol 500 MG tablet  Commonly known as:  ROBAXIN  Take 1 tablet (500 mg total) by mouth 3 (three) times daily as needed.     metoprolol tartrate 25 MG tablet  Commonly known as:  LOPRESSOR  TAKE 1 TABLET (25 MG TOTAL) BY MOUTH 2 (TWO) TIMES DAILY.     multivitamin tablet  Take 1 tablet by mouth daily.     oxyCODONE-acetaminophen 5-325 MG tablet  Commonly known as:  ROXICET  Take 1-2 tablets by mouth every 4 (four) hours as needed for severe pain.     OXYGEN  Inhale 2 L/min into the lungs at bedtime.     PROAIR HFA 108 (90 Base) MCG/ACT inhaler  Generic drug:  albuterol  1-2 PUFFS EVERY 4-6 HOURS AS NEEDED     albuterol (2.5 MG/3ML) 0.083% nebulizer solution  Commonly known as:  PROVENTIL  Take 3 mLs (2.5 mg total) by nebulization every 4 (four) hours as needed for wheezing or shortness of breath.     simvastatin 20 MG tablet  Commonly known as:  ZOCOR  Take 20 mg by mouth every evening.     sodium chloride 0.65 % Soln nasal spray  Commonly known as:  OCEAN  Place 1-2 sprays into both nostrils daily as needed for congestion.     spironolactone 25 MG tablet  Commonly known as:  ALDACTONE  Take 1 tablet (25 mg total) by mouth daily.     traZODone 100 MG tablet  Commonly known as:  DESYREL  Take 1 tablet (100 mg total) by mouth at bedtime.        Diagnostic Studies: Dg Shoulder Left Port  09/22/2015  CLINICAL DATA:  Status post left shoulder replacement EXAM: LEFT SHOULDER - 1 VIEW COMPARISON:  None. FINDINGS: Total left shoulder replacement  identified glenoid and humeral components in anticipated position. Mild soft tissue postoperative change noted over the shoulder joint as anticipated. IMPRESSION: Anticipated postoperative appearance Electronically Signed   By: Skipper Cliche M.D.   On: 09/22/2015 15:11    Disposition: 01-Home or Self Care      Discharge Instructions    Call MD / Call 911    Complete by:  As directed   If you experience chest pain or shortness of breath, CALL 911 and be transported to the hospital emergency room.  If you develope a fever above 101 F, pus (white drainage) or increased drainage or redness at the wound, or calf pain, call your surgeon's office.     Constipation Prevention    Complete by:  As directed   Drink plenty of fluids.  Prune juice may be helpful.  You may use a stool softener, such as Colace (over the counter) 100 mg twice a day.  Use MiraLax (over the counter) for constipation as needed.     Diet - low sodium heart healthy    Complete by:  As directed      Increase activity slowly as tolerated    Complete by:  As directed            Follow-up Information    Follow up with NORRIS,STEVEN R, MD. Call in 2 weeks.   Specialty:  Orthopedic Surgery   Why:  [phone]   Contact information:   [address] [place] [address] [phone]        Signed: Ventura Bruns 09/26/2015, 9:00 PM

## 2015-10-04 ENCOUNTER — Ambulatory Visit: Payer: Medicare Other | Admitting: Pulmonary Disease

## 2015-10-11 ENCOUNTER — Ambulatory Visit (INDEPENDENT_AMBULATORY_CARE_PROVIDER_SITE_OTHER): Payer: Medicare Other | Admitting: Pulmonary Disease

## 2015-10-11 ENCOUNTER — Encounter: Payer: Self-pay | Admitting: Pulmonary Disease

## 2015-10-11 VITALS — BP 122/64 | HR 77 | Ht 72.0 in | Wt 260.8 lb

## 2015-10-11 DIAGNOSIS — J9611 Chronic respiratory failure with hypoxia: Secondary | ICD-10-CM

## 2015-10-11 DIAGNOSIS — J449 Chronic obstructive pulmonary disease, unspecified: Secondary | ICD-10-CM

## 2015-10-11 DIAGNOSIS — J4489 Other specified chronic obstructive pulmonary disease: Secondary | ICD-10-CM

## 2015-10-11 NOTE — Progress Notes (Signed)
Current Outpatient Prescriptions on File Prior to Visit  Medication Sig  . albuterol (PROVENTIL) (2.5 MG/3ML) 0.083% nebulizer solution Take 3 mLs (2.5 mg total) by nebulization every 4 (four) hours as needed for wheezing or shortness of breath.  . budesonide-formoterol (SYMBICORT) 160-4.5 MCG/ACT inhaler Inhale 2 puffs into the lungs at bedtime.  [name] ENTRESTO 24-26 MG TAKE 1 TABLET BY MOUTH 2 (TWO) TIMES DAILY.  . finasteride (PROSCAR) 5 MG tablet Take 5 mg by mouth daily.   [name] FLUoxetine (PROZAC) 40 MG capsule Take 40 mg by mouth daily with breakfast.   . furosemide (LASIX) 20 MG tablet Take 1 tablet (20 mg total) by mouth daily.  [name] HYDROcodone-acetaminophen (NORCO/VICODIN) 5-325 MG per tablet Take 2 tablets by mouth 2 (two) times daily as needed (for pain). (Patient taking differently: Take 2 tablets by mouth every 6 (six) hours as needed (for pain). )  . metoprolol tartrate (LOPRESSOR) 25 MG tablet TAKE 1 TABLET (25 MG TOTAL) BY MOUTH 2 (TWO) TIMES DAILY.  . Multiple Vitamin (MULTIVITAMIN) tablet Take 1 tablet by mouth daily.  . OXYGEN Inhale 2 L/min into the lungs at bedtime.  [name] PROAIR HFA 108 (90 BASE) MCG/ACT inhaler 1-2 PUFFS EVERY 4-6 HOURS AS NEEDED (Patient taking differently: 1-2 PUFFS EVERY 4-6 HOURS AS NEEDED for shortness of breath)  . simvastatin (ZOCOR) 20 MG tablet Take 20 mg by mouth every evening.   . sodium chloride (OCEAN) 0.65 % SOLN nasal spray Place 1-2 sprays into both nostrils daily as needed for congestion.  [name] spironolactone (ALDACTONE) 25 MG tablet Take 1 tablet (25 mg total) by mouth daily.  . traZODone (DESYREL) 100 MG tablet Take 1 tablet (100 mg total) by mouth at bedtime.   No current facility-administered medications on file prior to visit.    Chief Complaint  Patient presents with  . Follow-up    Denies any current breathing issues. Pt would like referral to go back to [facility]   Tests PSG May 2008>> AHI 8  Spirometry 11/18/08>>FEV1 2.15 (61%), FEV1% 62   Bronchoscopy 07/28/12 >> focal granulomatous inflammation Spirometry 03/14/14 >> FEV1 2.10 (60%), FEV1% 57 Echo 08/19/14 >> EF 50 to 16%, grade 1 diastolic dysfx Echo 96/78/93 >> EF 30 to 35%  Chest imaging CT chest 05/19/12>>superior segment LLL infiltrate, b/l hilar and mediastinal LAN with calcification CT chest 07/13/12 >> atherosclerosis, Lt hilar adenopathy, LLL superior segment narrowing, mass like opacity superior segment LLL 5.4 x 1.9 cm, multiple nodular areas LLL, diffuse GGO LLL PET scan 07/22/12 >> mildly hypermetabolic mediastinal adenopathy (SUV 5.8), superior segment nodules hypermetabolic (SUV 81.0) CT chest 08/31/12 >> decreased size of lesions CT chest 12/02/12 >> borderline mediastinal/hilar LAN, nodular septal thickening b/l, no change superior segment LLL, centrilobular emphysema CT chest 03/19/13 >> ?slight increase in LLL consolidative change, borderline LAN CT chest 08/24/13 >> 1.9 cm Rt paratracheal LAN, calcified 2.1 cm subcarinal LAN, narrowing of mainstem bronchi, chronic infiltrate LLL CT chest 03/16/14 >> atherosclerosis, no change in mediastinal and b/l hilar LAN, no change in LLL mass like opacity, thickening of interstitium, scattered sub-pleural nodules, small Lt pleural effusion, gallstones CT chest 08/19/14 >> small b/l effusions, emphysema, no change LLL  Past medical history Depression, PSTD, Migraines, combined CHF, CAD, HTN, HLD, Back pain, SDH 2010, Pseudomonal PNA 2016  PSHx, Medications, Allergies, Fhx, Shx reviewed.  Vital signs BP 122/64 mmHg  Pulse 77  Ht 6' (1.829 m)  Wt 260 lb 12.8 oz (118.298 kg)  BMI 35.36 kg/m2  SpO2 96%   History of Present Illness: Travis AMBROCIO is a 75 y.o. male former smoker with COPD, and sarcoidosis.  Since I saw him last, he had left shoulder surgery.  He is still having quite a bit of pain, and has increased his pain medication dose.  He feels his functional level is slowing improving.  His breathing has been  doing well.  He is not having cough, wheeze, or chest tightness.  He continues to use supplemental oxygen at night.  Physical Exam:  General - No distress ENT - no sinus tenderness, no oral exudate, no LAN Cardiac - s1s2 regular, no murmur Chest - prolonged exhalation, no wheeze/rales Back - No focal tenderness Abd - Soft, non-tender Ext - No edema Neuro - Normal strength Skin - No rashes Psych - normal mood, and behavior   Assessment/Plan:  COPD with chronic bronchitis. Plan: - continue symbicort - continue prn proair - will arrange for referral back to pulmonary rehab  Chronic respiratory failure with hypoxia. Related to COPD, pulmonary sarcoidosis, presumed OSA. Plan: - continue 2 liters oxygen at night  Pulmonary sarcoidosis. Seems stable. Plan: - monitor clinically  Combined heart failure. Plan: - f/u with cardiology   Patient Instructions  Will arrange for referral to pulmonary rehab  Follow up in 6 months    Chesley Mires, MD [facility] Pager:  [phone] 10/11/2015, 3:01 PM

## 2015-10-11 NOTE — Patient Instructions (Signed)
Will arrange for referral to pulmonary rehab  Follow up in 6 months

## 2015-10-26 ENCOUNTER — Encounter (HOSPITAL_COMMUNITY): Payer: Self-pay | Admitting: Radiology

## 2015-10-26 NOTE — Progress Notes (Signed)
Called patient to confirm echocardiogram appointment. Patient reported that has been experiencing left hand and shoulder numbness and tightness post surgery. He states that Drs. Norris and Perini believe its not cardiac it is a "pinched" nerve. He states he not having dizziness or shortness of breath.

## 2015-10-27 ENCOUNTER — Ambulatory Visit (HOSPITAL_COMMUNITY): Payer: Medicare Other | Attending: Cardiology

## 2015-10-27 ENCOUNTER — Other Ambulatory Visit: Payer: Self-pay

## 2015-10-27 DIAGNOSIS — Z01810 Encounter for preprocedural cardiovascular examination: Secondary | ICD-10-CM | POA: Diagnosis not present

## 2015-10-27 DIAGNOSIS — I5042 Chronic combined systolic (congestive) and diastolic (congestive) heart failure: Secondary | ICD-10-CM | POA: Diagnosis not present

## 2015-10-27 DIAGNOSIS — I340 Nonrheumatic mitral (valve) insufficiency: Secondary | ICD-10-CM | POA: Diagnosis not present

## 2015-10-27 DIAGNOSIS — J449 Chronic obstructive pulmonary disease, unspecified: Secondary | ICD-10-CM | POA: Diagnosis not present

## 2015-10-27 DIAGNOSIS — Z01818 Encounter for other preprocedural examination: Secondary | ICD-10-CM

## 2015-10-27 DIAGNOSIS — I517 Cardiomegaly: Secondary | ICD-10-CM | POA: Diagnosis not present

## 2015-10-30 ENCOUNTER — Encounter (HOSPITAL_COMMUNITY): Payer: Self-pay

## 2015-10-30 ENCOUNTER — Encounter (HOSPITAL_COMMUNITY)
Admission: RE | Admit: 2015-10-30 | Discharge: 2015-10-30 | Disposition: A | Discharge status: Home / Self Care | Payer: Medicare Other | Source: Ambulatory Visit | Attending: Pulmonary Disease | Admitting: Pulmonary Disease

## 2015-10-30 VITALS — BP 124/84 | HR 81 | Resp 20 | Ht 70.0 in | Wt 264.1 lb

## 2015-10-30 DIAGNOSIS — E785 Hyperlipidemia, unspecified: Secondary | ICD-10-CM | POA: Diagnosis not present

## 2015-10-30 DIAGNOSIS — J9611 Chronic respiratory failure with hypoxia: Secondary | ICD-10-CM | POA: Diagnosis not present

## 2015-10-30 DIAGNOSIS — K219 Gastro-esophageal reflux disease without esophagitis: Secondary | ICD-10-CM | POA: Insufficient documentation

## 2015-10-30 DIAGNOSIS — Z79899 Other long term (current) drug therapy: Secondary | ICD-10-CM | POA: Diagnosis not present

## 2015-10-30 DIAGNOSIS — J449 Chronic obstructive pulmonary disease, unspecified: Secondary | ICD-10-CM | POA: Insufficient documentation

## 2015-10-30 NOTE — Progress Notes (Signed)
Travis Ferguson 75 y.o. male Pulmonary Rehab Orientation Note Patient arrived today in Cardiac and Pulmonary Rehab for orientation to Pulmonary Rehab. He was transported from [employer] via wheel chair. He does not carry portable oxygen. Per pt, he uses 2 liters oxygen continuously at HS for OSA. Color good, skin warm and dry. Patient is oriented to time and place. Patient's medical history, psychosocial health, and medications reviewed. Psychosocial assessment reveals pt lives with their spouse. She also has a chronic respiratory condition and presents today for orientation to pulmonary rehab. Pt is currently retired. Pt hobbies include gardening, reading, feeding birds, and spending time with his grand and great grandchildren. Pt reports his stress level is moderate. Areas of stress/anxiety include his Health, particularly the pain and immobility in his left shoulder from recent shoulder surgery.  Pt does not exhibit signs of depression. PHQ2/9 score 0/na. Pt shows good  coping skills with positive outlook. He was offered emotional support and reassurance. Physical assessment reveals heart rate is normal. Breath sounds clear to auscultation, no wheezes, rales, or rhonchi. Grip strength equal, strong. Distal pulses palpable. No edema noted. Patient reports he does take medications as prescribed. Patient states he follows a Low Sodium diet. The patient reports no specific efforts to gain or lose weight.. Patient's weight will be monitored closely. Demonstration and practice of PLB using pulse oximeter. Patient able to return demonstration satisfactorily. Safety and hand hygiene in the exercise area reviewed with patient. Patient voices understanding of the information reviewed. Department expectations discussed with patient and achievable goals were set. The patient shows enthusiasm about attending the program and we look forward to working with this nice gentleman. The patient is scheduled for a 6 min walk test  on Thursday 5/11 and to begin exercise on Tuesday 5/23 in the 1:30 class.   45 minutes was spent on a variety of activities such as assessment of the patient, obtaining baseline data including height, weight, BMI, and grip strength, verifying medical history, allergies, and current medications, and teaching patient strategies for performing tasks with less respiratory effort with emphasis on pursed lip breathing.

## 2015-10-31 ENCOUNTER — Encounter: Payer: Self-pay | Admitting: Cardiovascular Disease

## 2015-10-31 ENCOUNTER — Ambulatory Visit (INDEPENDENT_AMBULATORY_CARE_PROVIDER_SITE_OTHER): Payer: Medicare Other | Admitting: Cardiovascular Disease

## 2015-10-31 VITALS — BP 104/60 | HR 90 | Ht 72.0 in | Wt 262.0 lb

## 2015-10-31 DIAGNOSIS — E785 Hyperlipidemia, unspecified: Secondary | ICD-10-CM | POA: Diagnosis not present

## 2015-10-31 DIAGNOSIS — I10 Essential (primary) hypertension: Secondary | ICD-10-CM | POA: Diagnosis not present

## 2015-10-31 DIAGNOSIS — I5042 Chronic combined systolic (congestive) and diastolic (congestive) heart failure: Secondary | ICD-10-CM

## 2015-10-31 DIAGNOSIS — I519 Heart disease, unspecified: Secondary | ICD-10-CM | POA: Diagnosis not present

## 2015-10-31 NOTE — Progress Notes (Signed)
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 He is referred through the courtesy of Dr. Joylene Draft for cardiovascular evaluation because of chronic dyspnea and exertional chest pressure. I last saw him in the office 05/12/15.[name]  His cardiac risk factor profile is remarkable for remote discontinued tobacco abuse back in 1996. He smoked a pipe at that time. He has treated hypertension and hyperlipidemia. He does have a family history of heart disease with a brother who had bypass surgery in his mid 67s. He has never had a heart attack or stroke. His past surgical history is remarkable for subdural hematoma and brain surgery in the past. He has been worked up by Dr. Halford Chessman for question of lung CA, which has never been clinically documented. He did have a chest CT that showed, among other things, plaque and atherosclerosis in his coronary arteries.   The patient recently had a 2-D echocardiogram and Myoview stress test which showed normal left for function and no evidence of ischemia. He did have grade 1 diastolic dysfunction. He denies chest pain. 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  He had cholecystectomy in early May for gangrenous gallbladder with prolonged hospitalization. He was recently noted to be more short of breath in usual and a BNP was ordered that was in the 500 range. He was treated with oral diuretics which resulted in marked improvement in his symptoms.  A follow-up 2-D echocardiogram showed an EF of 20 or 25% with severe global hypokinesia as well as segmental wall motion abnormalities.  Because of the decline in his ejection fraction he underwent right and left heart cath by myself 01/23/15 revealing minimal CAD, elevated filling pressures and severe LV dysfunction. I offered him the option of having an ICD placed for primary prevention and referred him to Dr. Erline Hau he has happily decided not to pursue this. He is participating in cardiac rehabilitation.his most recent 2-D echo performed 04/19/15 revealed persistent LV dysfunction with an EF in the 30% range despite optimal medical therapy.  He recently underwent right shoulder repair by Dr. Laurina Bustle and has continued right arm pain. Also sounds like he has a cervical radiculopathy. He is participating in pulmonary rehabilitation. A recent 2-D echocardiogram performed 10/27/15 revealed slight improvement in his ejection fraction up to 35-40%.    Current Outpatient Prescriptions  Medication Sig Dispense Refill  . albuterol (PROVENTIL) (2.5 MG/3ML) 0.083% nebulizer solution Take 3 mLs (2.5 mg total) by nebulization every 4 (four) hours as needed for wheezing or shortness of breath. 75 mL 12  . budesonide-formoterol (SYMBICORT) 160-4.5 MCG/ACT inhaler Inhale 2 puffs into the lungs at bedtime. 2 Inhaler 0  . ENTRESTO 24-26 MG TAKE 1 TABLET BY MOUTH 2 (TWO) TIMES DAILY. 60 tablet 2  . finasteride (PROSCAR) 5 MG tablet Take 5 mg by mouth daily.     [name] FLUoxetine (PROZAC) 40 MG capsule Take 40 mg by mouth daily with breakfast.     . furosemide (LASIX) 20 MG tablet Take 1 tablet (20 mg total) by mouth daily. 30 tablet 4  . HYDROcodone-acetaminophen (NORCO/VICODIN) 5-325 MG per tablet Take 2  tablets by mouth 2 (two) times daily as needed (for pain). (Patient taking differently: Take 2 tablets by mouth every 6 (six) hours as needed (for pain). ) 30 tablet 0  . metoprolol tartrate (LOPRESSOR) 25 MG tablet TAKE 1 TABLET (25 MG TOTAL) BY MOUTH 2 (TWO) TIMES DAILY. 90 tablet 1  . Multiple Vitamin (MULTIVITAMIN) tablet Take 1 tablet by mouth  daily.    . OXYGEN Inhale 2 L/min into the lungs at bedtime.    [name] PROAIR HFA 108 (90 BASE) MCG/ACT inhaler 1-2 PUFFS EVERY 4-6 HOURS AS NEEDED (Patient taking differently: 1-2 PUFFS EVERY 4-6 HOURS AS NEEDED for shortness of breath) 8.5 Inhaler 1  . simvastatin (ZOCOR) 20 MG tablet Take 20 mg by mouth every evening.     . sodium chloride (OCEAN) 0.65 % SOLN nasal spray Place 1-2 sprays into both nostrils daily as needed for congestion.    [name] spironolactone (ALDACTONE) 25 MG tablet Take 1 tablet (25 mg total) by mouth daily. 30 tablet 4  . traZODone (DESYREL) 100 MG tablet Take 1 tablet (100 mg total) by mouth at bedtime. 15 tablet 0   No current facility-administered medications for this visit.    Allergies  Allergen Reactions  . Statins Other (See Comments)    Muscle aches - tolerating Vytorin    Social History   Social History  . Marital Status: Married    Spouse Name: N/A  . Number of Children: N/A  . Years of Education: N/A   Occupational History  . retired Airline pilot in [place]  . Smoking status: Former Smoker -- 0.00 packs/day for 30 years    Types: Pipe    Quit date: 06/24/2008  . Smokeless tobacco: Never Used  . Alcohol Use: No  . Drug Use: No  . Sexual Activity: Not Currently   Other Topics Concern  . Not on file   Social History Narrative     Review of Systems: General: negative for chills, fever, night sweats or weight changes.  Cardiovascular: negative for chest pain, dyspnea on exertion, edema, orthopnea, palpitations, paroxysmal nocturnal dyspnea or shortness of breath Dermatological: negative for rash Respiratory: negative for cough or wheezing Urologic: negative for hematuria Abdominal: negative for nausea, vomiting, diarrhea, bright red blood per rectum, melena, or hematemesis Neurologic: negative for visual changes, syncope, or dizziness All other systems reviewed and are otherwise negative except as noted  above.    Blood pressure 104/60, pulse 90, height 6' (1.829 m), weight 262 lb (118.842 kg).  General appearance: alert and no distress Neck: no adenopathy, no carotid bruit, no JVD, supple, symmetrical, trachea midline and thyroid not enlarged, symmetric, no tenderness/mass/nodules Lungs: clear to auscultation bilaterally Heart: regular rate and rhythm, S1, S2 normal, no murmur, click, rub or gallop Extremities: extremities normal, atraumatic, no cyanosis or edema  EKG not performed today  ASSESSMENT AND PLAN:   Essential hypertension History of hypertension with blood pressure measured today at 104/60. He is on metoprolol. Continue current meds at current dosing  Hyperlipidemia History of hyperlipidemia on statin therapy followed by his PCP  Left ventricular dysfunction History of left ventricular dysfunction with EF of 20% last year resulting in a heart cath 01/23/15 revealing minimal CAD with elevated filling pressures. He's been placed on October medical therapy and titrated. His CF currently is 35-45%. He has chronic systolic heart failure medically treated.      Lorretta Harp MD FACP,FACC,FAHA, [facility] 10/31/2015 11:14 AM

## 2015-10-31 NOTE — Patient Instructions (Addendum)
Medication Instructions:  Your physician recommends that you continue on your current medications as directed. Please refer to the Current Medication list given to you today.   Labwork: none  Testing/Procedures: Your physician has requested that you have an echocardiogram. Echocardiography is a painless test that uses sound waves to create images of your heart. It provides your doctor with information about the size and shape of your heart and how well your heart's chambers and valves are working. This procedure takes approximately one hour. There are no restrictions for this procedure.  IN 6 MONTHS.    Follow-Up: Your physician wants you to follow-up in: [place]. You will receive a reminder letter in the mail two months in advance. If you don't receive a letter, please call our office to schedule the follow-up appointment.   Any Other Special Instructions Will Be Listed Below (If Applicable).     If you need a refill on your cardiac medications before your next appointment, please call your pharmacy.

## 2015-10-31 NOTE — Assessment & Plan Note (Signed)
History of hyperlipidemia on statin therapy followed by his PCP 

## 2015-10-31 NOTE — Assessment & Plan Note (Signed)
History of left ventricular dysfunction with EF of 20% last year resulting in a heart cath 01/23/15 revealing minimal CAD with elevated filling pressures. He's been placed on October medical therapy and titrated. His CF currently is 35-45%. He has chronic systolic heart failure medically treated.

## 2015-10-31 NOTE — Assessment & Plan Note (Signed)
History of hypertension with blood pressure measured today at 104/60. He is on metoprolol. Continue current meds at current dosing

## 2015-11-02 ENCOUNTER — Encounter (HOSPITAL_COMMUNITY)
Admission: RE | Admit: 2015-11-02 | Discharge: 2015-11-02 | Disposition: A | Discharge status: Home / Self Care | Payer: Medicare Other | Source: Ambulatory Visit | Attending: Pulmonary Disease | Admitting: Pulmonary Disease

## 2015-11-02 DIAGNOSIS — J449 Chronic obstructive pulmonary disease, unspecified: Secondary | ICD-10-CM | POA: Diagnosis not present

## 2015-11-02 NOTE — Progress Notes (Signed)
Pulmonary Individual Treatment Plan  Patient Details  Name: Travis Ferguson MRN: [id number] Date of Birth: [birth year] Referring Provider:        Pulmonary Rehab Walk Test from 11/02/2015 in [place]   Referring Provider  Dr. Halford Chessman      Initial Encounter Date:       Pulmonary Rehab Walk Test from 11/02/2015 in [place]   Date  11/02/15   Referring Provider  Dr. Halford Chessman      Visit Diagnosis: No diagnosis found.  Patient's Home Medications on Admission:   Current outpatient prescriptions:  .  albuterol (PROVENTIL) (2.5 MG/3ML) 0.083% nebulizer solution, Take 3 mLs (2.5 mg total) by nebulization every 4 (four) hours as needed for wheezing or shortness of breath., Disp: 75 mL, Rfl: 12 .  budesonide-formoterol (SYMBICORT) 160-4.5 MCG/ACT inhaler, Inhale 2 puffs into the lungs at bedtime., Disp: 2 Inhaler, Rfl: 0 .  ENTRESTO 24-26 MG, TAKE 1 TABLET BY MOUTH 2 (TWO) TIMES DAILY., Disp: 60 tablet, Rfl: 2 .  finasteride (PROSCAR) 5 MG tablet, Take 5 mg by mouth daily. , Disp: , Rfl:  .  FLUoxetine (PROZAC) 40 MG capsule, Take 40 mg by mouth daily with breakfast. , Disp: , Rfl:  .  furosemide (LASIX) 20 MG tablet, Take 1 tablet (20 mg total) by mouth daily., Disp: 30 tablet, Rfl: 4 .  HYDROcodone-acetaminophen (NORCO/VICODIN) 5-325 MG per tablet, Take 2 tablets by mouth 2 (two) times daily as needed (for pain). (Patient taking differently: Take 2 tablets by mouth every 6 (six) hours as needed (for pain). ), Disp: 30 tablet, Rfl: 0 .  metoprolol tartrate (LOPRESSOR) 25 MG tablet, TAKE 1 TABLET (25 MG TOTAL) BY MOUTH 2 (TWO) TIMES DAILY., Disp: 90 tablet, Rfl: 1 .  Multiple Vitamin (MULTIVITAMIN) tablet, Take 1 tablet by mouth daily., Disp: , Rfl:  .  OXYGEN, Inhale 2 L/min into the lungs at bedtime., Disp: , Rfl:  .  PROAIR HFA 108 (90 BASE) MCG/ACT inhaler, 1-2 PUFFS EVERY 4-6 HOURS AS NEEDED (Patient taking differently: 1-2 PUFFS  EVERY 4-6 HOURS AS NEEDED for shortness of breath), Disp: 8.5 Inhaler, Rfl: 1 .  simvastatin (ZOCOR) 20 MG tablet, Take 20 mg by mouth every evening. , Disp: , Rfl:  .  sodium chloride (OCEAN) 0.65 % SOLN nasal spray, Place 1-2 sprays into both nostrils daily as needed for congestion., Disp: , Rfl:  .  spironolactone (ALDACTONE) 25 MG tablet, Take 1 tablet (25 mg total) by mouth daily., Disp: 30 tablet, Rfl: 4 .  traZODone (DESYREL) 100 MG tablet, Take 1 tablet (100 mg total) by mouth at bedtime., Disp: 15 tablet, Rfl: 0  Past Medical History: Past Medical History  Diagnosis Date  . Depression   . PTSD (post-traumatic stress disorder)   . GERD (gastroesophageal reflux disease)   . Dyslipidemia   . Rhinitis   . Cervical disc disease   . Subdural hematoma ([place]) 11/10    a. 2010 s/p surgery - diagnosed several weeks after a fall.  . Hypertension   . Coronary artery calcification seen on CAT scan   . Sarcoidosis ([place])   . Atherosclerosis   . Dyspnea     chronic  . Chronic lower back pain     chronic back pain/under pain management  . Inguinal hernia     right side  . Colon polyps     adenomatous and hyperplastic  . Sepsis ([place])   . Gallstones   .  Sensation problem     right side-lateral hip to knee" decreased sensation and tingling feeling" -has informed Dr. Joylene Draft, Dr. Henrene Pastor, Dr. Lucia Gaskins of this.  . Encounter for biliary drainage tube placement     remains with drainage bag to right side for  biliary drainage.  . Sinus congestion     10-27-14 at present some issues-"not bad"  . History of oxygen administration     @ 2 l/m nasally at bedtime.(09/22/2015)  . Hiatal hernia     sensation problem ("right lateral side hip to knee").  . Anxiety   . Frequent urination   . Colon polyps 08/02/2014    Tubular adenoma x 3, Hyperplastic-1  . Diastolic dysfunction     grade 1 diastolic dysfunction on 2-D echo  . Left ventricular dysfunction     ejection fraction of 25-30% with regional  wall motion abnormalities.  . Calculus of gallbladder with acute cholecystitis and obstruction   . Gangrenous cholecystitis 08/17/2014  . Steroid-induced hyperglycemia 02/25/2015  . Acute on chronic combined systolic and diastolic CHF (congestive heart failure) ([place]) 02/24/2015  . OSA (obstructive sleep apnea)     does not use CPAP; "just oxygen" (09/22/2015)  . Pneumonia ~ 1958  . CAP (community acquired pneumonia) 02/24/2015  . History of blood transfusion "numerous"  . COPD (chronic obstructive pulmonary disease) ([place])     a. Former Airline pilot, also smoked a pipe.  . Migraine     "years since I've had one" (09/22/2015)  . Arthritis     "about q joint I've got" (09/22/2015)    Tobacco Use: History  Smoking status  . Former Smoker -- 0.00 packs/day for 30 years  . Types: Pipe  . Quit date: 06/24/2008  Smokeless tobacco  . Never Used    Labs: Recent Review Flowsheet Data    Labs for ITP Cardiac and Pulmonary Rehab Latest Ref Rng 05/11/2009 08/19/2014 01/23/2015 01/23/2015 02/24/2015   PHART 7.350 - 7.450 7.375 7.373 7.409 - 7.295(L)   PCO2ART 35.0 - 45.0 mmHg 36.6 33.5(L) 34.6(L) - 44.0   HCO3 20.0 - 24.0 mEq/L 20.9 19.1(L) 21.9 23.7 20.8   TCO2 0 - 100 mmol/L 22.0 16.'9 23 25 '$ 18.7   ACIDBASEDEF 0.0 - 2.0 mmol/L 3.4(H) 4.8(H) 2.0 1.0 5.2(H)   O2SAT - 98.9 93.4 94.0 68.0 99.5      Capillary Blood Glucose: Lab Results  Component Value Date   GLUCAP 121* 02/27/2015   GLUCAP 170* 02/26/2015   GLUCAP 133* 02/26/2015   GLUCAP 146* 02/26/2015   GLUCAP 153* 02/26/2015     ADL UCSD:   Pulmonary Function Assessment:     Pulmonary Function Assessment - 10/30/15 0947    Breath   Bilateral Breath Sounds Clear   Shortness of Breath Limiting activity;Yes      Exercise Target Goals: Date: 11/02/15  Exercise Program Goal: Individual exercise prescription set with THRR, safety & activity barriers. Participant demonstrates ability to understand and report RPE using BORG scale, to  self-measure pulse accurately, and to acknowledge the importance of the exercise prescription.  Exercise Prescription Goal: Starting with aerobic activity 30 plus minutes a day, 3 days per week for initial exercise prescription. Provide home exercise prescription and guidelines that participant acknowledges understanding prior to discharge.  Activity Barriers & Risk Stratification:     Activity Barriers & Cardiac Risk Stratification - 10/30/15 0945    Activity Barriers & Cardiac Risk Stratification   Activity Barriers Deconditioning;Shortness of Breath;Balance Concerns;Assistive Device;Joint Problems  6 Minute Walk:     6 Minute Walk      11/02/15 1631       6 Minute Walk   Phase Initial     Distance 1050 feet     Walk Time 6 minutes     MPH 1.98     RPE 13     VO2 Peak 7.14     Symptoms Yes (comment)     Comments knee pain     Resting HR 90 bpm     Resting BP 120/60 mmHg     Max Ex. HR 123 bpm     Max Ex. BP 140/80 mmHg     Interval HR   3 Minute HR 123     Interval Heart Rate? Yes     Interval Oxygen   Interval Oxygen? Yes     Baseline Liters of Oxygen 0 L     1 Minute Liters of Oxygen 0 L     2 Minute Liters of Oxygen 0 L     3 Minute Liters of Oxygen 0 L     4 Minute Oxygen Saturation % 89 %     4 Minute Liters of Oxygen 0 L     5 Minute Liters of Oxygen 0 L     6 Minute Oxygen Saturation % 90 %     6 Minute Liters of Oxygen 0 L     2 Minute Post Liters of Oxygen 0 L        Initial Exercise Prescription:     Initial Exercise Prescription - 11/02/15 1600    Date of Initial Exercise RX and Referring Provider   Date 11/02/15   Referring Provider Dr. Halford Chessman   Oxygen   Oxygen --  room air   Bike   Level 0.5   Minutes 15   NuStep   Level 2   Minutes 15   METs 1.5   Track   Laps 5   Minutes 15   Prescription Details   Frequency (times per week) 2   Duration Progress to 45 minutes of aerobic exercise without signs/symptoms of physical distress    Intensity   THRR 40-80% of Max Heartrate 58-116   Ratings of Perceived Exertion 11-13   Perceived Dyspnea 0-4   Progression   Progression Continue progressive overload as per policy without signs/symptoms or physical distress.   Resistance Training   Training Prescription Yes   Weight blue bands   Reps 10-12      Perform Capillary Blood Glucose checks as needed.  Exercise Prescription Changes:   Exercise Comments:   Discharge Exercise Prescription (Final Exercise Prescription Changes):    Nutrition:  Target Goals: Understanding of nutrition guidelines, daily intake of sodium '1500mg'$ , cholesterol '200mg'$ , calories 30% from fat and 7% or less from saturated fats, daily to have 5 or more servings of fruits and vegetables.  Biometrics:     Pre Biometrics - 10/30/15 0947    Pre Biometrics   Grip Strength 28 kg       Nutrition Therapy Plan and Nutrition Goals:   Nutrition Discharge: Rate Your Plate Scores:   Psychosocial: Target Goals: Acknowledge presence or absence of depression, maximize coping skills, provide positive support system. Participant is able to verbalize types and ability to use techniques and skills needed for reducing stress and depression.  Initial Review & Psychosocial Screening:     Initial Psych Review & Screening - 10/30/15 0953    Initial Review   Current  issues with History of Depression;Current Stress Concerns   Source of Stress Concerns Chronic Illness;Unable to perform yard/household activities   [place]? Yes   Barriers   Psychosocial barriers to participate in program Psychosocial barriers identified (see note)   Screening Interventions   Interventions Encouraged to exercise      Quality of Life Scores:   PHQ-9:     Recent Review Flowsheet Data    Depression screen [facility] 2/9 10/30/2015 06/12/2015 03/13/2015 12/23/2014   Decreased Interest 0 0 0 0   Down, Depressed, Hopeless 0 '3 1 1   '$ PHQ - 2 Score  0 '3 1 1   '$ Altered sleeping - 3 - -   Tired, decreased energy - 3 - -   Change in appetite - 0 - -   Feeling bad or failure about yourself  - 0 - -   Trouble concentrating - 0 - -   Moving slowly or fidgety/restless - 0 - -   Suicidal thoughts - 0 - -   PHQ-9 Score - 9 - -   Difficult doing work/chores - Somewhat difficult - -      Psychosocial Evaluation and Intervention:     Psychosocial Evaluation - 10/30/15 0954    Psychosocial Evaluation & Interventions   Interventions Encouraged to exercise with the program and follow exercise prescription      Psychosocial Re-Evaluation:  Education: Education Goals: Education classes will be provided on a weekly basis, covering required topics. Participant will state understanding/return demonstration of topics presented.  Learning Barriers/Preferences:     Learning Barriers/Preferences - 10/30/15 0946    Learning Barriers/Preferences   Learning Barriers None   Learning Preferences Group Instruction;Individual Instruction;Verbal Instruction;Skilled Demonstration;Written Material      Education Topics: Risk Factor Reduction:  -Group instruction that is supported by a PowerPoint presentation. Instructor discusses the definition of a risk factor, different risk factors for pulmonary disease, and how the heart and lungs work together.     Nutrition for Pulmonary Patient:  -Group instruction provided by PowerPoint slides, verbal discussion, and written materials to support subject matter. The instructor gives an explanation and review of healthy diet recommendations, which includes a discussion on weight management, recommendations for fruit and vegetable consumption, as well as protein, fluid, caffeine, fiber, sodium, sugar, and alcohol. Tips for eating when patients are short of breath are discussed.   Pursed Lip Breathing:  -Group instruction that is supported by demonstration and informational handouts. Instructor discusses the  benefits of pursed lip and diaphragmatic breathing and detailed demonstration on how to preform both.     Oxygen Safety:  -Group instruction provided by PowerPoint, verbal discussion, and written material to support subject matter. There is an overview of "What is Oxygen" and "Why do we need it".  Instructor also reviews how to create a safe environment for oxygen use, the importance of using oxygen as prescribed, and the risks of noncompliance. There is a brief discussion on traveling with oxygen and resources the patient may utilize.   Oxygen Equipment:  -Group instruction provided by [facility] Staff utilizing handouts, written materials, and equipment demonstrations.   Signs and Symptoms:  -Group instruction provided by written material and verbal discussion to support subject matter. Warning signs and symptoms of infection, stroke, and heart attack are reviewed and when to call the physician/911 reinforced. Tips for preventing the spread of infection discussed.   Advanced Directives:  -Group instruction provided by verbal instruction and written material to  support subject matter. Instructor reviews Advanced Directive laws and proper instruction for filling out document.   Pulmonary Video:  -Group video education that reviews the importance of medication and oxygen compliance, exercise, good nutrition, pulmonary hygiene, and pursed lip and diaphragmatic breathing for the pulmonary patient.   Exercise for the Pulmonary Patient:  -Group instruction that is supported by a PowerPoint presentation. Instructor discusses benefits of exercise, core components of exercise, frequency, duration, and intensity of an exercise routine, importance of utilizing pulse oximetry during exercise, safety while exercising, and options of places to exercise outside of rehab.     Pulmonary Medications:  -Verbally interactive group education provided by instructor with focus on inhaled medications and  proper administration.   Anatomy and Physiology of the Respiratory System and Intimacy:  -Group instruction provided by PowerPoint, verbal discussion, and written material to support subject matter. Instructor reviews respiratory cycle and anatomical components of the respiratory system and their functions. Instructor also reviews differences in obstructive and restrictive respiratory diseases with examples of each. Intimacy, Sex, and Sexuality differences are reviewed with a discussion on how relationships can change when diagnosed with pulmonary disease. Common sexual concerns are reviewed.   Knowledge Questionnaire Score:   Core Components/Risk Factors/Patient Goals at Admission:     Personal Goals and Risk Factors at Admission - 10/30/15 0949    Core Components/Risk Factors/Patient Goals on Admission    Weight Management Obesity;Yes   Intervention Weight Management: Develop a combined nutrition and exercise program designed to reach desired caloric intake, while maintaining appropriate intake of nutrient and fiber, sodium and fats, and appropriate energy expenditure required for the weight goal.;Weight Management: Provide education and appropriate resources to help participant work on and attain dietary goals.;Weight Management/Obesity: Establish reasonable short term and long term weight goals.;Obesity: Provide education and appropriate resources to help participant work on and attain dietary goals.   Admit Weight 264 lb 1.8 oz (119.8 kg)   Expected Outcomes Short Term: Continue to assess and modify interventions until short term weight is achieved;Long Term: Adherence to nutrition and physical activity/exercise program aimed toward attainment of established weight goal   Sedentary Yes   Intervention Provide advice, education, support and counseling about physical activity/exercise needs.;Develop an individualized exercise prescription for aerobic and resistive training based on initial  evaluation findings, risk stratification, comorbidities and participant's personal goals.   Expected Outcomes Achievement of increased cardiorespiratory fitness and enhanced flexibility, muscular endurance and strength shown through measurements of functional capacity and personal statement of participant.   Increase Strength and Stamina Yes   Intervention Provide advice, education, support and counseling about physical activity/exercise needs.;Develop an individualized exercise prescription for aerobic and resistive training based on initial evaluation findings, risk stratification, comorbidities and participant's personal goals.   Expected Outcomes Achievement of increased cardiorespiratory fitness and enhanced flexibility, muscular endurance and strength shown through measurements of functional capacity and personal statement of participant.   Improve shortness of breath with ADL's Yes   Intervention Provide education, individualized exercise plan and daily activity instruction to help decrease symptoms of SOB with activities of daily living.   Expected Outcomes Short Term: Achieves a reduction of symptoms when performing activities of daily living.   Develop more efficient breathing techniques such as purse lipped breathing and diaphragmatic breathing; and practicing self-pacing with activity Yes   Intervention Provide education, demonstration and support about specific breathing techniuqes utilized for more efficient breathing. Include techniques such as pursed lipped breathing, diaphragmatic breathing and self-pacing activity.   Expected  Outcomes Short Term: Participant will be able to demonstrate and use breathing techniques as needed throughout daily activities.   Increase knowledge of respiratory medications and ability to use respiratory devices properly  Yes   Intervention Provide education and demonstration as needed of appropriate use of medications, inhalers, and oxygen therapy.   Expected  Outcomes Short Term: Achieves understanding of medications use. Understands that oxygen is a medication prescribed by physician. Demonstrates appropriate use of inhaler and oxygen therapy.   Heart Failure Yes   Intervention Provide a combined exercise and nutrition program that is supplemented with education, support and counseling about heart failure. Directed toward relieving symptoms such as shortness of breath, decreased exercise tolerance, and extremity edema.   Expected Outcomes Improve functional capacity of life;Short term: Attendance in program 2-3 days a week with increased exercise capacity. Reported lower sodium intake. Reported increased fruit and vegetable intake. Reports medication compliance.;Short term: Daily weights obtained and reported for increase. Utilizing diuretic protocols set by physician.;Long term: Adoption of self-care skills and reduction of barriers for early signs and symptoms recognition and intervention leading to self-care maintenance.   Personal Goal Other Yes   Personal Goal to become more mobile with less shortness of breath   Intervention increase work loads on equiptment as tolerated inorder to increase stamina and strength   Expected Outcomes patient will report he is able to do more physical activities with less shortness of breath      Core Components/Risk Factors/Patient Goals Review:    Core Components/Risk Factors/Patient Goals at Discharge (Final Review):    ITP Comments:   Comments:

## 2015-11-08 ENCOUNTER — Encounter (HOSPITAL_COMMUNITY): Payer: Self-pay | Admitting: *Deleted

## 2015-11-14 ENCOUNTER — Encounter (HOSPITAL_COMMUNITY)
Admission: RE | Admit: 2015-11-14 | Discharge: 2015-11-14 | Disposition: A | Discharge status: Home / Self Care | Payer: Medicare Other | Source: Ambulatory Visit | Attending: Pulmonary Disease | Admitting: Pulmonary Disease

## 2015-11-14 VITALS — Wt 261.9 lb

## 2015-11-14 DIAGNOSIS — J449 Chronic obstructive pulmonary disease, unspecified: Secondary | ICD-10-CM | POA: Diagnosis not present

## 2015-11-14 DIAGNOSIS — J9611 Chronic respiratory failure with hypoxia: Secondary | ICD-10-CM

## 2015-11-14 NOTE — Progress Notes (Signed)
Daily Session Note  Patient Details  Name: Travis Ferguson MRN: [id number] Date of Birth: 1940/12/14 Referring Provider:        Pulmonary Rehab Walk Test from 11/02/2015 in [place]   Referring Provider  Dr. Halford Chessman      Encounter Date: 11/14/2015  Check In:     Session Check In - 11/14/15 1511    Check-In   Location [facility]   Staff Present Su Hilt, MS, ACSM RCEP, Exercise Physiologist;Portia Rollene Rotunda, RN, BSN   Supervising physician immediately available to respond to emergencies [facility] immediately available   Physician(s) Dr. Marily Memos   Medication changes reported     No   Fall or balance concerns reported    No   Warm-up and Cool-down Performed as group-led instruction   Resistance Training Performed Yes   VAD Patient? No   Pain Assessment   Currently in Pain? No/denies   Multiple Pain Sites No      Capillary Blood Glucose: No results found for this or any previous visit (from the past 24 hour(s)).      Exercise Prescription Changes - 11/14/15 1500    Response to Exercise   Blood Pressure (Admit) 104/64 mmHg   Blood Pressure (Exercise) 116/74 mmHg   Blood Pressure (Exit) 112/60 mmHg   Heart Rate (Admit) 83 bpm   Heart Rate (Exercise) 85 bpm   Heart Rate (Exit) 77 bpm   Oxygen Saturation (Admit) 95 %   Oxygen Saturation (Exercise) 92 %   Oxygen Saturation (Exit) 97 %   Rating of Perceived Exertion (Exercise) 11   Perceived Dyspnea (Exercise) 1   Duration Progress to 45 minutes of aerobic exercise without signs/symptoms of physical distress   Intensity THRR unchanged   Progression   Progression Continue to progress workloads to maintain intensity without signs/symptoms of physical distress.   Resistance Training   Training Prescription Yes   Weight blue bands   Reps 10-12   Interval Training   Interval Training No   Bike   Level 0.5   Minutes 15   NuStep   Level 2   Minutes 15   METs  1.3   Track   Laps 8   Minutes 15     Goals Met:  Exercise tolerated well No report of cardiac concerns or symptoms Strength training completed today  Goals Unmet:  Not Applicable  Comments: Service time is from 1330 to 1500    Dr. Rush Farmer is Medical Director for Pulmonary Rehab at [facility].

## 2015-11-16 ENCOUNTER — Encounter (HOSPITAL_COMMUNITY)
Admission: RE | Admit: 2015-11-16 | Discharge: 2015-11-16 | Disposition: A | Discharge status: Home / Self Care | Payer: Medicare Other | Source: Ambulatory Visit | Attending: Pulmonary Disease | Admitting: Pulmonary Disease

## 2015-11-16 VITALS — Wt 260.4 lb

## 2015-11-16 DIAGNOSIS — J449 Chronic obstructive pulmonary disease, unspecified: Secondary | ICD-10-CM | POA: Diagnosis not present

## 2015-11-16 DIAGNOSIS — J9611 Chronic respiratory failure with hypoxia: Secondary | ICD-10-CM

## 2015-11-16 NOTE — Progress Notes (Signed)
Daily Session Note  Patient Details  Name: Travis Ferguson MRN: [id number] Date of Birth: 21-Jan-1941 Referring Provider:        Pulmonary Rehab Walk Test from 11/02/2015 in [place]   Referring Provider  Dr. Halford Chessman      Encounter Date: 11/16/2015  Check In:     Session Check In - 11/16/15 1435    Check-In   Location [facility]   Staff Present Rosebud Poles, RN, BSN;Molly diVincenzo, MS, ACSM RCEP, Exercise Physiologist;Jonell Brumbaugh Ysidro Evert, RN;Portia Rollene Rotunda, RN, BSN   Supervising physician immediately available to respond to emergencies [facility] immediately available   Physician(s) Dr. Eliseo Squires   Medication changes reported     No   Fall or balance concerns reported    No   Warm-up and Cool-down Performed as group-led instruction   Resistance Training Performed Yes   VAD Patient? No   Pain Assessment   Currently in Pain? No/denies   Multiple Pain Sites No      Capillary Blood Glucose: No results found for this or any previous visit (from the past 24 hour(s)).      Exercise Prescription Changes - 11/16/15 1600    Exercise Review   Progression Yes   Response to Exercise   Blood Pressure (Admit) 97/69 mmHg   Blood Pressure (Exercise) 112/60 mmHg   Blood Pressure (Exit) 95/64 mmHg   Heart Rate (Admit) 74 bpm   Heart Rate (Exercise) 92 bpm   Heart Rate (Exit) 80 bpm   Oxygen Saturation (Admit) 95 %   Oxygen Saturation (Exercise) 94 %   Oxygen Saturation (Exit) 96 %   Rating of Perceived Exertion (Exercise) 11   Perceived Dyspnea (Exercise) 0   Duration Progress to 45 minutes of aerobic exercise without signs/symptoms of physical distress   Intensity THRR unchanged   Progression   Progression Continue to progress workloads to maintain intensity without signs/symptoms of physical distress.   Resistance Training   Training Prescription Yes   Weight blue bands   Reps 10-12   Interval Training   Interval Training No   Recumbant Bike   Level 1   Minutes 15   NuStep   Level 3   Minutes 15   METs 1.5     Goals Met:  Exercise tolerated well No report of cardiac concerns or symptoms Strength training completed today  Goals Unmet:  Not Applicable  Comments: Service time is from 1330 to 1515    Dr. Rush Farmer is Medical Director for Pulmonary Rehab at [facility].

## 2015-11-21 ENCOUNTER — Encounter (HOSPITAL_COMMUNITY)
Admission: RE | Admit: 2015-11-21 | Discharge: 2015-11-21 | Disposition: A | Discharge status: Home / Self Care | Payer: Medicare Other | Source: Ambulatory Visit | Attending: Pulmonary Disease | Admitting: Pulmonary Disease

## 2015-11-21 VITALS — Wt 262.6 lb

## 2015-11-21 DIAGNOSIS — J449 Chronic obstructive pulmonary disease, unspecified: Secondary | ICD-10-CM | POA: Diagnosis not present

## 2015-11-21 DIAGNOSIS — J9611 Chronic respiratory failure with hypoxia: Secondary | ICD-10-CM

## 2015-11-21 DIAGNOSIS — D869 Sarcoidosis, unspecified: Secondary | ICD-10-CM

## 2015-11-21 NOTE — Progress Notes (Signed)
Daily Session Note  Patient Details  Name: Travis Ferguson MRN: [id number] Date of Birth: [birth year] Referring Provider:        Pulmonary Rehab Walk Test from 11/02/2015 in [place]   Referring Provider  Dr. Halford Chessman      Encounter Date: 11/21/2015  Check In:     Session Check In - 11/21/15 1331    Check-In   Location [facility]   Staff Present Rosebud Poles, RN, Luisa Hart, RN, BSN;Ramon Dredge, RN, MHA;Molly diVincenzo, MS, ACSM RCEP, Exercise Physiologist   Supervising physician immediately available to respond to emergencies [facility] immediately available   Physician(s) Dr. Marily Memos   Medication changes reported     No   Fall or balance concerns reported    No   Warm-up and Cool-down Performed as group-led instruction   Resistance Training Performed Yes   VAD Patient? No   Pain Assessment   Currently in Pain? No/denies   Multiple Pain Sites No      Capillary Blood Glucose: No results found for this or any previous visit (from the past 24 hour(s)).      Exercise Prescription Changes - 11/21/15 1520    Exercise Review   Progression Yes   Response to Exercise   Blood Pressure (Admit) 96/64 mmHg   Blood Pressure (Exercise) 104/60 mmHg   Blood Pressure (Exit) 104/60 mmHg   Heart Rate (Admit) 87 bpm   Heart Rate (Exercise) 90 bpm   Heart Rate (Exit) 87 bpm   Oxygen Saturation (Admit) 95 %   Oxygen Saturation (Exercise) 93 %   Oxygen Saturation (Exit) 96 %   Rating of Perceived Exertion (Exercise) 13   Perceived Dyspnea (Exercise) 2   Duration Progress to 45 minutes of aerobic exercise without signs/symptoms of physical distress   Intensity THRR unchanged   Progression   Progression Continue to progress workloads to maintain intensity without signs/symptoms of physical distress.   Resistance Training   Training Prescription Yes   Weight blue bands   Reps 10-12   Interval Training   Interval  Training No   Recumbant Bike   Level 2   Minutes 15   NuStep   Level 3   Minutes 15   METs 1.5   Track   Laps 10   Minutes 15     Goals Met:  Exercise tolerated well Queuing for purse lip breathing No report of cardiac concerns or symptoms Strength training completed today  Goals Unmet:  Not Applicable  Comments: Service time is from 1330 to 1500   Dr. Rush Farmer is Medical Director for Pulmonary Rehab at [facility].

## 2015-11-23 ENCOUNTER — Encounter (HOSPITAL_COMMUNITY)
Admission: RE | Admit: 2015-11-23 | Discharge: 2015-11-23 | Disposition: A | Discharge status: Home / Self Care | Payer: Medicare Other | Source: Ambulatory Visit | Attending: Pulmonary Disease | Admitting: Pulmonary Disease

## 2015-11-23 DIAGNOSIS — E785 Hyperlipidemia, unspecified: Secondary | ICD-10-CM | POA: Diagnosis not present

## 2015-11-23 DIAGNOSIS — Z79899 Other long term (current) drug therapy: Secondary | ICD-10-CM | POA: Diagnosis not present

## 2015-11-23 DIAGNOSIS — K219 Gastro-esophageal reflux disease without esophagitis: Secondary | ICD-10-CM | POA: Insufficient documentation

## 2015-11-23 DIAGNOSIS — J449 Chronic obstructive pulmonary disease, unspecified: Secondary | ICD-10-CM | POA: Insufficient documentation

## 2015-11-23 DIAGNOSIS — J9611 Chronic respiratory failure with hypoxia: Secondary | ICD-10-CM | POA: Diagnosis not present

## 2015-11-23 NOTE — Progress Notes (Signed)
Daily Session Note  Patient Details  Name: Travis Ferguson MRN: [id number] Date of Birth: 10/08/1940 Referring Provider:        Pulmonary Rehab Walk Test from 11/02/2015 in [place]   Referring Provider  Dr. Halford Chessman      Encounter Date: 11/23/2015  Check In:     Session Check In - 11/23/15 1355    Check-In   Location [facility]   Staff Present Rosebud Poles, RN, BSN;Lisa Ysidro Evert, RN;Portia Rollene Rotunda, RN, BSN;Ramon Dredge, RN, MHA;Molly diVincenzo, MS, ACSM RCEP, Exercise Physiologist   Supervising physician immediately available to respond to emergencies [facility] immediately available   Physician(s) Dr. Renaee Munda   Medication changes reported     No   Fall or balance concerns reported    No   Warm-up and Cool-down Performed as group-led instruction   Resistance Training Performed Yes   VAD Patient? No   Pain Assessment   Currently in Pain? No/denies   Multiple Pain Sites No      Capillary Blood Glucose: No results found for this or any previous visit (from the past 24 hour(s)).      Exercise Prescription Changes - 11/23/15 1600    Exercise Review   Progression Yes   Response to Exercise   Blood Pressure (Admit) 92/60 mmHg   Blood Pressure (Exercise) 112/80 mmHg   Blood Pressure (Exit) 106/66 mmHg   Heart Rate (Admit) 92 bpm   Heart Rate (Exercise) 84 bpm   Heart Rate (Exit) 86 bpm   Oxygen Saturation (Admit) 92 %   Oxygen Saturation (Exercise) 94 %   Oxygen Saturation (Exit) 96 %   Rating of Perceived Exertion (Exercise) 11   Perceived Dyspnea (Exercise) 0   Duration Progress to 45 minutes of aerobic exercise without signs/symptoms of physical distress   Intensity THRR unchanged   Progression   Progression Continue to progress workloads to maintain intensity without signs/symptoms of physical distress.   Resistance Training   Training Prescription Yes   Weight blue bands   Reps 10-12   Interval Training   Interval Training No   Recumbant Bike   Level 2   Minutes 15   NuStep   Level 3   Minutes 15   METs 1.6     Goals Met:  Exercise tolerated well Strength training completed today  Goals Unmet:  Not Applicable  Comments: Service time is from 1330 to 1530    Dr. Rush Farmer is Medical Director for Pulmonary Rehab at [facility].

## 2015-11-28 ENCOUNTER — Encounter (HOSPITAL_COMMUNITY)
Admission: RE | Admit: 2015-11-28 | Discharge: 2015-11-28 | Disposition: A | Discharge status: Home / Self Care | Payer: Medicare Other | Source: Ambulatory Visit | Attending: Pulmonary Disease | Admitting: Pulmonary Disease

## 2015-11-28 VITALS — Wt 261.5 lb

## 2015-11-28 DIAGNOSIS — J449 Chronic obstructive pulmonary disease, unspecified: Secondary | ICD-10-CM | POA: Diagnosis not present

## 2015-11-28 DIAGNOSIS — J9611 Chronic respiratory failure with hypoxia: Secondary | ICD-10-CM

## 2015-11-28 NOTE — Progress Notes (Signed)
Daily Session Note  Patient Details  Name: Travis Ferguson MRN: [id number] Date of Birth: 12/18/1940 Referring Provider:        Pulmonary Rehab Walk Test from 11/02/2015 in [facility]   Referring Provider  Dr. Sood      Encounter Date: 11/28/2015  Check In:     Session Check In - 11/28/15 1326    Check-In   Location [facility]   Staff Present Joan Behrens, RN, BSN; , RN;Portia Payne, RN, BSN;Annedrea Stackhouse, RN, MHA;Molly diVincenzo, MS, ACSM RCEP, Exercise Physiologist   Supervising physician immediately available to respond to emergencies [facility] immediately available   Physician(s) Dr. Merrell   Medication changes reported     No   Fall or balance concerns reported    No   Warm-up and Cool-down Performed as group-led instruction   Resistance Training Performed Yes   VAD Patient? No   Pain Assessment   Currently in Pain? No/denies   Multiple Pain Sites No      Capillary Blood Glucose: No results found for this or any previous visit (from the past 24 hour(s)).      Exercise Prescription Changes - 11/28/15 1500    Exercise Review   Progression Yes   Response to Exercise   Blood Pressure (Admit) 121/88 mmHg   Blood Pressure (Exercise) 110/60 mmHg   Blood Pressure (Exit) 111/73 mmHg   Heart Rate (Admit) 91 bpm   Heart Rate (Exercise) 93 bpm   Heart Rate (Exit) 86 bpm   Oxygen Saturation (Admit) 93 %   Oxygen Saturation (Exercise) 90 %   Oxygen Saturation (Exit) 96 %   Rating of Perceived Exertion (Exercise) 12   Perceived Dyspnea (Exercise) 1   Duration Progress to 45 minutes of aerobic exercise without signs/symptoms of physical distress   Intensity THRR unchanged   Progression   Progression Continue to progress workloads to maintain intensity without signs/symptoms of physical distress.   Resistance Training   Training Prescription Yes   Weight blue bands   Reps 10-12   Interval Training    Interval Training No   Recumbant Bike   Level 2   Minutes 15   NuStep   Level 4   Minutes 15   METs 1.5   Track   Laps 11   Minutes 15     Goals Met:  Exercise tolerated well No report of cardiac concerns or symptoms Strength training completed today  Goals Unmet:  Not Applicable  Comments: Service time is from 1330 to 1500    Dr. Wesam G. Yacoub is Medical Director for Pulmonary Rehab at [facility]. 

## 2015-11-30 ENCOUNTER — Encounter (HOSPITAL_COMMUNITY)
Admission: RE | Admit: 2015-11-30 | Discharge: 2015-11-30 | Disposition: A | Discharge status: Home / Self Care | Payer: Medicare Other | Source: Ambulatory Visit | Attending: Pulmonary Disease | Admitting: Pulmonary Disease

## 2015-11-30 VITALS — Wt 259.3 lb

## 2015-11-30 DIAGNOSIS — J9611 Chronic respiratory failure with hypoxia: Secondary | ICD-10-CM

## 2015-11-30 DIAGNOSIS — J449 Chronic obstructive pulmonary disease, unspecified: Secondary | ICD-10-CM | POA: Diagnosis not present

## 2015-11-30 NOTE — Progress Notes (Signed)
Daily Session Note  Patient Details  Name: Travis Ferguson MRN: [id number] Date of Birth: 1940-10-21 Referring Provider:        Pulmonary Rehab Walk Test from 11/02/2015 in [place]   Referring Provider  Dr. Halford Chessman      Encounter Date: 11/30/2015  Check In:     Session Check In - 11/30/15 1348    Check-In   Location [facility]   Staff Present Rosebud Poles, RN, BSN;Molly diVincenzo, MS, ACSM RCEP, Exercise Physiologist;Portia Rollene Rotunda, RN, BSN   Supervising physician immediately available to respond to emergencies [facility] immediately available   Physician(s) Dr. Marily Memos   Medication changes reported     No   Fall or balance concerns reported    No   Warm-up and Cool-down Performed as group-led instruction   Resistance Training Performed Yes   VAD Patient? No   Pain Assessment   Currently in Pain? No/denies   Multiple Pain Sites No      Capillary Blood Glucose: No results found for this or any previous visit (from the past 24 hour(s)).      Exercise Prescription Changes - 11/30/15 1600    Response to Exercise   Blood Pressure (Admit) 94/62 mmHg   Blood Pressure (Exercise) 100/60 mmHg   Blood Pressure (Exit) 92/58 mmHg   Heart Rate (Admit) 84 bpm   Heart Rate (Exercise) 89 bpm   Heart Rate (Exit) 82 bpm   Oxygen Saturation (Admit) 95 %   Oxygen Saturation (Exercise) 94 %   Oxygen Saturation (Exit) 94 %   Rating of Perceived Exertion (Exercise) 11   Perceived Dyspnea (Exercise) 2   Duration Progress to 45 minutes of aerobic exercise without signs/symptoms of physical distress   Intensity THRR unchanged   Progression   Progression Continue to progress workloads to maintain intensity without signs/symptoms of physical distress.   Resistance Training   Training Prescription Yes   Weight blue bands   Reps 10-12   Interval Training   Interval Training No   Recumbant Bike   Level 2   Minutes 15   NuStep   Level  4   Minutes 15   METs 1     Goals Met:  Exercise tolerated well No report of cardiac concerns or symptoms Strength training completed today  Goals Unmet:  Not Applicable  Comments: Service time is from 1330 to 1500    Dr. Rush Farmer is Medical Director for Pulmonary Rehab at [facility].

## 2015-11-30 NOTE — Progress Notes (Signed)
Travis Ferguson 75 y.o. male Nutrition Note Spoke with pt. Pt is obese. Pt reports he wants to lose wt but is not actively trying to lose wt. Pt with 19 lb wt gain over the past year. Per discussion, pt feels his mortality is limited given his medical history. Pt is the pre-contemplative state of change re: wt loss/improving eating habits. There are some ways the pt can make his eating habits healthier.  Pt's Rate Your Plate results reviewed with pt. Per nutrition screen, pt does not avoid salty food. However, pt's wife reports rarely using canned/convenience food.  Pt adds salt to food occasionally.  The role of sodium in lung disease reviewed with pt. Pt expressed understanding of the information reviewed via feedback method.    No results found for: HGBA1C  Nutrition Diagnosis ? Food-and nutrition-related knowledge deficit related to lack of exposure to information as related to diagnosis of pulmonary disease ? Oobesity related to excessive energy intake as evidenced by a BMI of 38 ?  Nutrition Rx/Est. Daily Nutrition Needs for: ? wt loss 1700-2200 Kcal  95-115 gm protein   1500 mg or less sodium Nutrition Intervention ? Pt's individual nutrition plan and goals reviewed with pt. ? Benefits of adopting healthy eating habits discussed when pt's Rate Your Plate reviewed. ? Pt to attend the Nutrition and Lung Disease class - met; 11/23/15 ? Continual client-centered nutrition education by RD, as part of interdisciplinary care. Goal(s) 1. Identify food quantities necessary to achieve wt loss of  -2# per week to a goal wt loss of 2.7-10.9 kg (6-24 lb) at graduation from pulmonary rehab. 2. Describe the benefit of including fruits, vegetables, whole grains, and low-fat dairy products in a healthy meal plan. Monitor and Evaluate progress toward nutrition goal with team.   Derek Mound, M.Ed, RD, LDN, CDE 11/30/2015 3:32 PM

## 2015-12-05 ENCOUNTER — Other Ambulatory Visit: Payer: Self-pay | Admitting: Cardiovascular Disease

## 2015-12-05 ENCOUNTER — Encounter (HOSPITAL_COMMUNITY)
Admission: RE | Admit: 2015-12-05 | Discharge: 2015-12-05 | Disposition: A | Discharge status: Home / Self Care | Payer: Medicare Other | Source: Ambulatory Visit | Attending: Pulmonary Disease | Admitting: Pulmonary Disease

## 2015-12-05 DIAGNOSIS — J449 Chronic obstructive pulmonary disease, unspecified: Secondary | ICD-10-CM | POA: Diagnosis not present

## 2015-12-05 NOTE — Progress Notes (Signed)
Pulmonary Individual Treatment Plan  Patient Details  Name: Travis Ferguson MRN: [id number] Date of Birth: 1940/07/26 Referring Provider:        Pulmonary Rehab Walk Test from 11/02/2015 in [place]   Referring Provider  Dr. Halford Chessman      Initial Encounter Date:       Pulmonary Rehab Walk Test from 11/02/2015 in [facility]   Date  11/02/15   Referring Provider  Dr. Halford Chessman      Visit Diagnosis: No diagnosis found.  Patient's Home Medications on Admission:   Current outpatient prescriptions:  .  albuterol (PROVENTIL) (2.5 MG/3ML) 0.083% nebulizer solution, Take 3 mLs (2.5 mg total) by nebulization every 4 (four) hours as needed for wheezing or shortness of breath., Disp: 75 mL, Rfl: 12 .  budesonide-formoterol (SYMBICORT) 160-4.5 MCG/ACT inhaler, Inhale 2 puffs into the lungs at bedtime., Disp: 2 Inhaler, Rfl: 0 .  ENTRESTO 24-26 MG, TAKE 1 TABLET BY MOUTH 2 (TWO) TIMES DAILY., Disp: 180 tablet, Rfl: 1 .  finasteride (PROSCAR) 5 MG tablet, Take 5 mg by mouth daily. , Disp: , Rfl:  .  FLUoxetine (PROZAC) 40 MG capsule, Take 40 mg by mouth daily with breakfast. , Disp: , Rfl:  .  furosemide (LASIX) 20 MG tablet, Take 1 tablet (20 mg total) by mouth daily., Disp: 30 tablet, Rfl: 4 .  HYDROcodone-acetaminophen (NORCO/VICODIN) 5-325 MG per tablet, Take 2 tablets by mouth 2 (two) times daily as needed (for pain). (Patient taking differently: Take 2 tablets by mouth every 6 (six) hours as needed (for pain). ), Disp: 30 tablet, Rfl: 0 .  metoprolol tartrate (LOPRESSOR) 25 MG tablet, TAKE 1 TABLET (25 MG TOTAL) BY MOUTH 2 (TWO) TIMES DAILY., Disp: 90 tablet, Rfl: 1 .  Multiple Vitamin (MULTIVITAMIN) tablet, Take 1 tablet by mouth daily., Disp: , Rfl:  .  OXYGEN, Inhale 2 L/min into the lungs at bedtime., Disp: , Rfl:  .  PROAIR HFA 108 (90 BASE) MCG/ACT inhaler, 1-2 PUFFS EVERY 4-6 HOURS AS NEEDED (Patient taking differently: 1-2 PUFFS  EVERY 4-6 HOURS AS NEEDED for shortness of breath), Disp: 8.5 Inhaler, Rfl: 1 .  simvastatin (ZOCOR) 20 MG tablet, Take 20 mg by mouth every evening. , Disp: , Rfl:  .  sodium chloride (OCEAN) 0.65 % SOLN nasal spray, Place 1-2 sprays into both nostrils daily as needed for congestion., Disp: , Rfl:  .  spironolactone (ALDACTONE) 25 MG tablet, Take 1 tablet (25 mg total) by mouth daily., Disp: 30 tablet, Rfl: 4 .  traZODone (DESYREL) 100 MG tablet, Take 1 tablet (100 mg total) by mouth at bedtime., Disp: 15 tablet, Rfl: 0  Past Medical History: Past Medical History  Diagnosis Date  . Depression   . PTSD (post-traumatic stress disorder)   . GERD (gastroesophageal reflux disease)   . Dyslipidemia   . Rhinitis   . Cervical disc disease   . Subdural hematoma ([place]) 11/10    a. 2010 s/p surgery - diagnosed several weeks after a fall.  . Hypertension   . Coronary artery calcification seen on CAT scan   . Sarcoidosis ([place])   . Atherosclerosis   . Dyspnea     chronic  . Chronic lower back pain     chronic back pain/under pain management  . Inguinal hernia     right side  . Colon polyps     adenomatous and hyperplastic  . Sepsis ([place])   . Gallstones   .  Sensation problem     right side-lateral hip to knee" decreased sensation and tingling feeling" -has informed Dr. Joylene Draft, Dr. Henrene Pastor, Dr. Lucia Gaskins of this.  . Encounter for biliary drainage tube placement     remains with drainage bag to right side for  biliary drainage.  . Sinus congestion     10-27-14 at present some issues-"not bad"  . History of oxygen administration     @ 2 l/m nasally at bedtime.(09/22/2015)  . Hiatal hernia     sensation problem ("right lateral side hip to knee").  . Anxiety   . Frequent urination   . Colon polyps 08/02/2014    Tubular adenoma x 3, Hyperplastic-1  . Diastolic dysfunction     grade 1 diastolic dysfunction on 2-D echo  . Left ventricular dysfunction     ejection fraction of 25-30% with regional  wall motion abnormalities.  . Calculus of gallbladder with acute cholecystitis and obstruction   . Gangrenous cholecystitis 08/17/2014  . Steroid-induced hyperglycemia 02/25/2015  . Acute on chronic combined systolic and diastolic CHF (congestive heart failure) ([place]) 02/24/2015  . OSA (obstructive sleep apnea)     does not use CPAP; "just oxygen" (09/22/2015)  . Pneumonia ~ 1958  . CAP (community acquired pneumonia) 02/24/2015  . History of blood transfusion "numerous"  . COPD (chronic obstructive pulmonary disease) ([place])     a. Former Airline pilot, also smoked a pipe.  . Migraine     "years since I've had one" (09/22/2015)  . Arthritis     "about q joint I've got" (09/22/2015)    Tobacco Use: History  Smoking status  . Former Smoker -- 0.00 packs/day for 30 years  . Types: Pipe  . Quit date: 06/24/2008  Smokeless tobacco  . Never Used    Labs:     Recent Review Flowsheet Data    Labs for ITP Cardiac and Pulmonary Rehab Latest Ref Rng 05/11/2009 08/19/2014 01/23/2015 01/23/2015 02/24/2015   PHART 7.350 - 7.450 7.375 7.373 7.409 - 7.295(L)   PCO2ART 35.0 - 45.0 mmHg 36.6 33.5(L) 34.6(L) - 44.0   HCO3 20.0 - 24.0 mEq/L 20.9 19.1(L) 21.9 23.7 20.8   TCO2 0 - 100 mmol/L 22.0 16.'9 23 25 '$ 18.7   ACIDBASEDEF 0.0 - 2.0 mmol/L 3.4(H) 4.8(H) 2.0 1.0 5.2(H)   O2SAT - 98.9 93.4 94.0 68.0 99.5      Capillary Blood Glucose: Lab Results  Component Value Date   GLUCAP 121* 02/27/2015   GLUCAP 170* 02/26/2015   GLUCAP 133* 02/26/2015   GLUCAP 146* 02/26/2015   GLUCAP 153* 02/26/2015     ADL UCSD:     Pulmonary Assessment Scores      11/08/15 1523       ADL UCSD   ADL Phase Entry     SOB Score total 15        Pulmonary Function Assessment:     Pulmonary Function Assessment - 10/30/15 0947    Breath   Bilateral Breath Sounds Clear   Shortness of Breath Limiting activity;Yes      Exercise Target Goals:    Exercise Program Goal: Individual exercise prescription set with THRR,  safety & activity barriers. Participant demonstrates ability to understand and report RPE using BORG scale, to self-measure pulse accurately, and to acknowledge the importance of the exercise prescription.  Exercise Prescription Goal: Starting with aerobic activity 30 plus minutes a day, 3 days per week for initial exercise prescription. Provide home exercise prescription and guidelines that participant acknowledges understanding prior to  discharge.  Activity Barriers & Risk Stratification:     Activity Barriers & Cardiac Risk Stratification - 10/30/15 0945    Activity Barriers & Cardiac Risk Stratification   Activity Barriers Deconditioning;Shortness of Breath;Balance Concerns;Assistive Device;Joint Problems      6 Minute Walk:     6 Minute Walk      11/02/15 1631       6 Minute Walk   Phase Initial     Distance 1050 feet     Walk Time 6 minutes     MPH 1.98     RPE 13     VO2 Peak 7.14     Symptoms Yes (comment)     Comments knee pain     Resting HR 90 bpm     Resting BP 120/60 mmHg     Max Ex. HR 123 bpm     Max Ex. BP 140/80 mmHg     Interval HR   3 Minute HR 123     Interval Heart Rate? Yes     Interval Oxygen   Interval Oxygen? Yes     Baseline Liters of Oxygen 0 L     1 Minute Liters of Oxygen 0 L     2 Minute Liters of Oxygen 0 L     3 Minute Liters of Oxygen 0 L     4 Minute Oxygen Saturation % 89 %     4 Minute Liters of Oxygen 0 L     5 Minute Liters of Oxygen 0 L     6 Minute Oxygen Saturation % 90 %     6 Minute Liters of Oxygen 0 L     2 Minute Post Liters of Oxygen 0 L        Initial Exercise Prescription:     Initial Exercise Prescription - 11/02/15 1600    Date of Initial Exercise RX and Referring Provider   Date 11/02/15   Referring Provider Dr. Halford Chessman   Oxygen   Oxygen --  room air   Bike   Level 0.5   Minutes 15   NuStep   Level 2   Minutes 15   METs 1.5   Track   Laps 5   Minutes 15   Prescription Details   Frequency (times  per week) 2   Duration Progress to 45 minutes of aerobic exercise without signs/symptoms of physical distress   Intensity   THRR 40-80% of Max Heartrate 58-116   Ratings of Perceived Exertion 11-13   Perceived Dyspnea 0-4   Progression   Progression Continue progressive overload as per policy without signs/symptoms or physical distress.   Resistance Training   Training Prescription Yes   Weight blue bands   Reps 10-12      Perform Capillary Blood Glucose checks as needed.  Exercise Prescription Changes:      Exercise Prescription Changes      11/14/15 1500 11/16/15 1600 11/21/15 1520 11/23/15 1600 11/28/15 1500   Exercise Review   Progression  Yes Yes Yes Yes   Response to Exercise   Blood Pressure (Admit) 104/64 mmHg 97/69 mmHg 96/64 mmHg 92/60 mmHg 121/88 mmHg   Blood Pressure (Exercise) 116/74 mmHg 112/60 mmHg 104/60 mmHg 112/80 mmHg 110/60 mmHg   Blood Pressure (Exit) 112/60 mmHg 95/64 mmHg 104/60 mmHg 106/66 mmHg 111/73 mmHg   Heart Rate (Admit) 83 bpm 74 bpm 87 bpm 92 bpm 91 bpm   Heart Rate (Exercise) 85 bpm 92 bpm 90 bpm 84 bpm 93  bpm   Heart Rate (Exit) 77 bpm 80 bpm 87 bpm 86 bpm 86 bpm   Oxygen Saturation (Admit) 95 % 95 % 95 % 92 % 93 %   Oxygen Saturation (Exercise) 92 % 94 % 93 % 94 % 90 %   Oxygen Saturation (Exit) 97 % 96 % 96 % 96 % 96 %   Rating of Perceived Exertion (Exercise) '11 11 13 11 12   '$ Perceived Dyspnea (Exercise) 1 0 2 0 1   Duration Progress to 45 minutes of aerobic exercise without signs/symptoms of physical distress Progress to 45 minutes of aerobic exercise without signs/symptoms of physical distress Progress to 45 minutes of aerobic exercise without signs/symptoms of physical distress Progress to 45 minutes of aerobic exercise without signs/symptoms of physical distress Progress to 45 minutes of aerobic exercise without signs/symptoms of physical distress   Intensity THRR unchanged THRR unchanged THRR unchanged THRR unchanged THRR unchanged    Progression   Progression Continue to progress workloads to maintain intensity without signs/symptoms of physical distress. Continue to progress workloads to maintain intensity without signs/symptoms of physical distress. Continue to progress workloads to maintain intensity without signs/symptoms of physical distress. Continue to progress workloads to maintain intensity without signs/symptoms of physical distress. Continue to progress workloads to maintain intensity without signs/symptoms of physical distress.   Resistance Training   Training Prescription Yes Yes Yes Yes Yes   Weight blue bands blue bands blue bands blue bands blue bands   Reps 10-12 10-12 10-12 10-12 10-12   Interval Training   Interval Training No No No No No   Bike   Level 0.5       Minutes 15       Recumbant Bike   Level  '1 2 2 2   '$ Minutes  '15 15 15 15   '$ NuStep   Level '2 3 3 3 4   '$ Minutes '15 15 15 15 15   '$ METs 1.3 1.5 1.5 1.6 1.5   Track   Laps '8  10  11   '$ Minutes '15  15  15     '$ 11/30/15 1500 11/30/15 1600 12/05/15 1600       Response to Exercise   Blood Pressure (Admit)  94/62 mmHg 108/54 mmHg     Blood Pressure (Exercise)  100/60 mmHg 100/60 mmHg     Blood Pressure (Exit)  92/58 mmHg 102/60 mmHg     Heart Rate (Admit)  84 bpm 99 bpm     Heart Rate (Exercise)  89 bpm 92 bpm     Heart Rate (Exit)  82 bpm 95 bpm     Oxygen Saturation (Admit)  95 % 93 %     Oxygen Saturation (Exercise)  94 % 93 %     Oxygen Saturation (Exit)  94 % 95 %     Rating of Perceived Exertion (Exercise)  11 12     Perceived Dyspnea (Exercise)  2 1     Comments reviewed home exercise program       Duration  Progress to 45 minutes of aerobic exercise without signs/symptoms of physical distress Progress to 45 minutes of aerobic exercise without signs/symptoms of physical distress     Intensity  THRR unchanged THRR unchanged     Progression   Progression  Continue to progress workloads to maintain intensity without signs/symptoms of  physical distress. Continue to progress workloads to maintain intensity without signs/symptoms of physical distress.     Horticulturist, commercial Prescription  Yes Yes     Weight  blue bands blue bands     Reps  10-12 10-12     Interval Training   Interval Training  No No     Recumbant Bike   Level  2 2     Minutes  15 15     NuStep   Level  4 4     Minutes  15 15     METs  1 1.5     Track   Laps   4     Minutes   15        Exercise Comments:      Exercise Comments      11/30/15 1525 12/07/15 0847         Exercise Comments Reviewed home exercise with patient. Patient will exercise at home 2-3 days a week for 30 minutes walk/bike.  Patient is limited in workload intensity due to orthopedic ailments. Still attempts to progress intensity. Will exercise at home with wife.          Discharge Exercise Prescription (Final Exercise Prescription Changes):     Exercise Prescription Changes - 12/05/15 1600    Response to Exercise   Blood Pressure (Admit) 108/54 mmHg   Blood Pressure (Exercise) 100/60 mmHg   Blood Pressure (Exit) 102/60 mmHg   Heart Rate (Admit) 99 bpm   Heart Rate (Exercise) 92 bpm   Heart Rate (Exit) 95 bpm   Oxygen Saturation (Admit) 93 %   Oxygen Saturation (Exercise) 93 %   Oxygen Saturation (Exit) 95 %   Rating of Perceived Exertion (Exercise) 12   Perceived Dyspnea (Exercise) 1   Duration Progress to 45 minutes of aerobic exercise without signs/symptoms of physical distress   Intensity THRR unchanged   Progression   Progression Continue to progress workloads to maintain intensity without signs/symptoms of physical distress.   Resistance Training   Training Prescription Yes   Weight blue bands   Reps 10-12   Interval Training   Interval Training No   Recumbant Bike   Level 2   Minutes 15   NuStep   Level 4   Minutes 15   METs 1.5   Track   Laps 4   Minutes 15       Nutrition:  Target Goals: Understanding of nutrition  guidelines, daily intake of sodium '1500mg'$ , cholesterol '200mg'$ , calories 30% from fat and 7% or less from saturated fats, daily to have 5 or more servings of fruits and vegetables.  Biometrics:     Pre Biometrics - 10/30/15 0947    Pre Biometrics   Grip Strength 28 kg       Nutrition Therapy Plan and Nutrition Goals:     Nutrition Therapy & Goals - 11/30/15 1538    Nutrition Therapy   Diet General, Healthful    Personal Nutrition Goals   Personal Goal #1 0.5-2 lb wt loss per week to a goal wt of 6-24 lb at graduation from [facility], educate and counsel regarding individualized specific dietary modifications aiming towards targeted core components such as weight, hypertension, lipid management, diabetes, heart failure and other comorbidities.   Expected Outcomes Short Term Goal: Understand basic principles of dietary content, such as calories, fat, sodium, cholesterol and nutrients.;Long Term Goal: Adherence to prescribed nutrition plan.      Nutrition Discharge: Rate Your Plate Scores:     Nutrition Assessments - 11/30/15 1539    MEDFICTS Scores  Pre Score 51      Psychosocial: Target Goals: Acknowledge presence or absence of depression, maximize coping skills, provide positive support system. Participant is able to verbalize types and ability to use techniques and skills needed for reducing stress and depression.  Initial Review & Psychosocial Screening:     Initial Psych Review & Screening - 10/30/15 0953    Initial Review   Current issues with History of Depression;Current Stress Concerns   Source of Stress Concerns Chronic Illness;Unable to perform yard/household activities   [place]? Yes   Barriers   Psychosocial barriers to participate in program Psychosocial barriers identified (see note)   Screening Interventions   Interventions Encouraged to exercise      Quality of Life  Scores:     Quality of Life - 11/08/15 1522    Quality of Life Scores   Health/Function Pre 15.47 %   Socioeconomic Pre 26.79 %   Psych/Spiritual Pre 28 %   Family Pre 22.38 %   GLOBAL Pre 21.36 %      PHQ-9:     Recent Review Flowsheet Data    Depression screen [facility] 2/9 10/30/2015 06/12/2015 03/13/2015 12/23/2014   Decreased Interest 0 0 0 0   Down, Depressed, Hopeless 0 '3 1 1   '$ PHQ - 2 Score 0 '3 1 1   '$ Altered sleeping - 3 - -   Tired, decreased energy - 3 - -   Change in appetite - 0 - -   Feeling bad or failure about yourself  - 0 - -   Trouble concentrating - 0 - -   Moving slowly or fidgety/restless - 0 - -   Suicidal thoughts - 0 - -   PHQ-9 Score - 9 - -   Difficult doing work/chores - Somewhat difficult - -      Psychosocial Evaluation and Intervention:     Psychosocial Evaluation - 10/30/15 0954    Psychosocial Evaluation & Interventions   Interventions Encouraged to exercise with the program and follow exercise prescription      Psychosocial Re-Evaluation:     Psychosocial Re-Evaluation      11/28/15 1638           Psychosocial Re-Evaluation   Interventions Encouraged to attend Pulmonary Rehabilitation for the exercise       Comments Mental outlook is more positive than in the past.  Exercise seems to have improved mental outlook.       Continued Psychosocial Services Needed Yes         Education: Education Goals: Education classes will be provided on a weekly basis, covering required topics. Participant will state understanding/return demonstration of topics presented.  Learning Barriers/Preferences:     Learning Barriers/Preferences - 10/30/15 0946    Learning Barriers/Preferences   Learning Barriers None   Learning Preferences Group Instruction;Individual Instruction;Verbal Instruction;Skilled Demonstration;Written Material      Education Topics: Risk Factor Reduction:  -Group instruction that is supported by a PowerPoint presentation.  Instructor discusses the definition of a risk factor, different risk factors for pulmonary disease, and how the heart and lungs work together.     Nutrition for Pulmonary Patient:  -Group instruction provided by PowerPoint slides, verbal discussion, and written materials to support subject matter. The instructor gives an explanation and review of healthy diet recommendations, which includes a discussion on weight management, recommendations for fruit and vegetable consumption, as well as protein, fluid, caffeine, fiber, sodium, sugar, and alcohol. Tips for eating  when patients are short of breath are discussed.      PULMONARY REHAB OTHER RESPIRATORY from 11/30/2015 in [place]   Date  11/23/15   Educator  RD   Instruction Review Code  2- meets goals/outcomes      Pursed Lip Breathing:  -Group instruction that is supported by demonstration and informational handouts. Instructor discusses the benefits of pursed lip and diaphragmatic breathing and detailed demonstration on how to preform both.        PULMONARY REHAB OTHER RESPIRATORY from 11/30/2015 in [place]   Date  11/16/15   Educator  EP   Instruction Review Code  2- meets goals/outcomes      Oxygen Safety:  -Group instruction provided by PowerPoint, verbal discussion, and written material to support subject matter. There is an overview of "What is Oxygen" and "Why do we need it".  Instructor also reviews how to create a safe environment for oxygen use, the importance of using oxygen as prescribed, and the risks of noncompliance. There is a brief discussion on traveling with oxygen and resources the patient may utilize.   Oxygen Equipment:  -Group instruction provided by [facility] Staff utilizing handouts, written materials, and equipment demonstrations.   Signs and Symptoms:  -Group instruction provided by written material and verbal discussion to support subject  matter. Warning signs and symptoms of infection, stroke, and heart attack are reviewed and when to call the physician/911 reinforced. Tips for preventing the spread of infection discussed.   Advanced Directives:  -Group instruction provided by verbal instruction and written material to support subject matter. Instructor reviews Advanced Directive laws and proper instruction for filling out document.   Pulmonary Video:  -Group video education that reviews the importance of medication and oxygen compliance, exercise, good nutrition, pulmonary hygiene, and pursed lip and diaphragmatic breathing for the pulmonary patient.          PULMONARY REHAB OTHER RESPIRATORY from 11/30/2015 in [place]   Date  11/30/15   Instruction Review Code  2- meets goals/outcomes      Exercise for the Pulmonary Patient:  -Group instruction that is supported by a PowerPoint presentation. Instructor discusses benefits of exercise, core components of exercise, frequency, duration, and intensity of an exercise routine, importance of utilizing pulse oximetry during exercise, safety while exercising, and options of places to exercise outside of rehab.     Pulmonary Medications:  -Verbally interactive group education provided by instructor with focus on inhaled medications and proper administration.   Anatomy and Physiology of the Respiratory System and Intimacy:  -Group instruction provided by PowerPoint, verbal discussion, and written material to support subject matter. Instructor reviews respiratory cycle and anatomical components of the respiratory system and their functions. Instructor also reviews differences in obstructive and restrictive respiratory diseases with examples of each. Intimacy, Sex, and Sexuality differences are reviewed with a discussion on how relationships can change when diagnosed with pulmonary disease. Common sexual concerns are reviewed.   Knowledge  Questionnaire Score:     Knowledge Questionnaire Score - 11/08/15 1522    Knowledge Questionnaire Score   Pre Score 10/13      Core Components/Risk Factors/Patient Goals at Admission:     Personal Goals and Risk Factors at Admission - 10/30/15 0949    Core Components/Risk Factors/Patient Goals on Admission    Weight Management Obesity;Yes   Intervention Weight Management: Develop a combined nutrition and exercise program designed to reach  desired caloric intake, while maintaining appropriate intake of nutrient and fiber, sodium and fats, and appropriate energy expenditure required for the weight goal.;Weight Management: Provide education and appropriate resources to help participant work on and attain dietary goals.;Weight Management/Obesity: Establish reasonable short term and long term weight goals.;Obesity: Provide education and appropriate resources to help participant work on and attain dietary goals.   Admit Weight 264 lb 1.8 oz (119.8 kg)   Expected Outcomes Short Term: Continue to assess and modify interventions until short term weight is achieved;Long Term: Adherence to nutrition and physical activity/exercise program aimed toward attainment of established weight goal   Sedentary Yes   Intervention Provide advice, education, support and counseling about physical activity/exercise needs.;Develop an individualized exercise prescription for aerobic and resistive training based on initial evaluation findings, risk stratification, comorbidities and participant's personal goals.   Expected Outcomes Achievement of increased cardiorespiratory fitness and enhanced flexibility, muscular endurance and strength shown through measurements of functional capacity and personal statement of participant.   Increase Strength and Stamina Yes   Intervention Provide advice, education, support and counseling about physical activity/exercise needs.;Develop an individualized exercise prescription for aerobic  and resistive training based on initial evaluation findings, risk stratification, comorbidities and participant's personal goals.   Expected Outcomes Achievement of increased cardiorespiratory fitness and enhanced flexibility, muscular endurance and strength shown through measurements of functional capacity and personal statement of participant.   Improve shortness of breath with ADL's Yes   Intervention Provide education, individualized exercise plan and daily activity instruction to help decrease symptoms of SOB with activities of daily living.   Expected Outcomes Short Term: Achieves a reduction of symptoms when performing activities of daily living.   Develop more efficient breathing techniques such as purse lipped breathing and diaphragmatic breathing; and practicing self-pacing with activity Yes   Intervention Provide education, demonstration and support about specific breathing techniuqes utilized for more efficient breathing. Include techniques such as pursed lipped breathing, diaphragmatic breathing and self-pacing activity.   Expected Outcomes Short Term: Participant will be able to demonstrate and use breathing techniques as needed throughout daily activities.   Increase knowledge of respiratory medications and ability to use respiratory devices properly  Yes   Intervention Provide education and demonstration as needed of appropriate use of medications, inhalers, and oxygen therapy.   Expected Outcomes Short Term: Achieves understanding of medications use. Understands that oxygen is a medication prescribed by physician. Demonstrates appropriate use of inhaler and oxygen therapy.   Heart Failure Yes   Intervention Provide a combined exercise and nutrition program that is supplemented with education, support and counseling about heart failure. Directed toward relieving symptoms such as shortness of breath, decreased exercise tolerance, and extremity edema.   Expected Outcomes Improve functional  capacity of life;Short term: Attendance in program 2-3 days a week with increased exercise capacity. Reported lower sodium intake. Reported increased fruit and vegetable intake. Reports medication compliance.;Short term: Daily weights obtained and reported for increase. Utilizing diuretic protocols set by physician.;Long term: Adoption of self-care skills and reduction of barriers for early signs and symptoms recognition and intervention leading to self-care maintenance.   Personal Goal Other Yes   Personal Goal to become more mobile with less shortness of breath   Intervention increase work loads on equiptment as tolerated inorder to increase stamina and strength   Expected Outcomes patient will report he is able to do more physical activities with less shortness of breath      Core Components/Risk Factors/Patient Goals Review:  Goals and Risk Factor Review      11/28/15 1633           Core Components/Risk Factors/Patient Goals Review   Personal Goals Review Increase Strength and Stamina;Improve shortness of breath with ADL's       Review Just started program, this is the 1st 30 days in program, no great change in strength and stamina yet       Expected Outcomes Should see a greater improvement in strength and stamina in the next 30 days with increasing workloads as           Core Components/Risk Factors/Patient Goals at Discharge (Final Review):      Goals and Risk Factor Review - 11/28/15 1633    Core Components/Risk Factors/Patient Goals Review   Personal Goals Review Increase Strength and Stamina;Improve shortness of breath with ADL's   Review Just started program, this is the 1st 30 days in program, no great change in strength and stamina yet   Expected Outcomes Should see a greater improvement in strength and stamina in the next 30 days with increasing workloads as       ITP Comments:   Comments: ITP REVIEW Pt is making expected progress toward personal goals after  completing 7 sessions.   Recommend continued exercise, life style modification, education, and utilization of breathing techniques to increase stamina and strength and decrease shortness of breath with exertion.

## 2015-12-05 NOTE — Progress Notes (Signed)
Daily Session Note  Patient Details  Name: ROMNEY COMPEAN MRN: [id number] Date of Birth: 05/06/1941 Referring Provider:        Pulmonary Rehab Walk Test from 11/02/2015 in [place]   Referring Provider  Dr. Halford Chessman      Encounter Date: 12/05/2015  Check In:     Session Check In - 12/05/15 1332    Check-In   Location [facility]   Staff Present Rodney Langton, Felipe Drone, RN, MHA;Haitham Dolinsky Leonia Reeves, RN, BSN;Molly diVincenzo, MS, ACSM RCEP, Exercise Physiologist;Portia Rollene Rotunda, RN, BSN   Supervising physician immediately available to respond to emergencies [facility] immediately available   Physician(s) Dr. Marily Memos   Medication changes reported     No   Fall or balance concerns reported    No   Warm-up and Cool-down Performed as group-led instruction   Resistance Training Performed Yes   VAD Patient? No   Pain Assessment   Currently in Pain? No/denies   Multiple Pain Sites No      Capillary Blood Glucose: No results found for this or any previous visit (from the past 24 hour(s)).      Exercise Prescription Changes - 12/05/15 1600    Response to Exercise   Blood Pressure (Admit) 108/54 mmHg   Blood Pressure (Exercise) 100/60 mmHg   Blood Pressure (Exit) 102/60 mmHg   Heart Rate (Admit) 99 bpm   Heart Rate (Exercise) 92 bpm   Heart Rate (Exit) 95 bpm   Oxygen Saturation (Admit) 93 %   Oxygen Saturation (Exercise) 93 %   Oxygen Saturation (Exit) 95 %   Rating of Perceived Exertion (Exercise) 12   Perceived Dyspnea (Exercise) 1   Duration Progress to 45 minutes of aerobic exercise without signs/symptoms of physical distress   Intensity THRR unchanged   Progression   Progression Continue to progress workloads to maintain intensity without signs/symptoms of physical distress.   Resistance Training   Training Prescription Yes   Weight blue bands   Reps 10-12   Interval Training   Interval Training No   Recumbant  Bike   Level 2   Minutes 15   NuStep   Level 4   Minutes 15   METs 1.5   Track   Laps 4   Minutes 15     Goals Met:  Using PLB without cueing & demonstrates good technique Exercise tolerated well Strength training completed today  Goals Unmet:  Not Applicable  Comments: Service time is from 1330 to 1500    Dr. Rush Farmer is Medical Director for Pulmonary Rehab at [facility].

## 2015-12-05 NOTE — Telephone Encounter (Signed)
Rx Refill

## 2015-12-07 ENCOUNTER — Encounter (HOSPITAL_COMMUNITY)
Admission: RE | Admit: 2015-12-07 | Discharge: 2015-12-07 | Disposition: A | Discharge status: Home / Self Care | Payer: Medicare Other | Source: Ambulatory Visit | Attending: Pulmonary Disease | Admitting: Pulmonary Disease

## 2015-12-07 VITALS — Wt 260.4 lb

## 2015-12-07 DIAGNOSIS — D869 Sarcoidosis, unspecified: Secondary | ICD-10-CM

## 2015-12-07 DIAGNOSIS — J449 Chronic obstructive pulmonary disease, unspecified: Secondary | ICD-10-CM | POA: Diagnosis not present

## 2015-12-07 DIAGNOSIS — J9611 Chronic respiratory failure with hypoxia: Secondary | ICD-10-CM

## 2015-12-07 NOTE — Progress Notes (Signed)
Daily Session Note  Patient Details  Name: Travis Ferguson MRN: [id number] Date of Birth: 1941/01/14 Referring Provider:        Pulmonary Rehab Walk Test from 11/02/2015 in [place]   Referring Provider  Dr. Halford Chessman      Encounter Date: 12/07/2015  Check In:     Session Check In - 12/07/15 1349    Check-In   [facility]   Staff Present Su Hilt, MS, ACSM RCEP, Exercise Physiologist;Kou Gucciardo Leonia Reeves, RN, Luisa Hart, RN, BSN   Supervising physician immediately available to respond to emergencies [facility] immediately available   Physician(s) Dr. Waldron Labs   Medication changes reported     No   Fall or balance concerns reported    No   Warm-up and Cool-down Performed as group-led instruction   Resistance Training Performed Yes   VAD Patient? No   Pain Assessment   Currently in Pain? No/denies   Multiple Pain Sites No      Capillary Blood Glucose: No results found for this or any previous visit (from the past 24 hour(s)).      Exercise Prescription Changes - 12/07/15 1700    Exercise Review   Progression Yes   Response to Exercise   Blood Pressure (Admit) 120/80 mmHg   Blood Pressure (Exercise) 112/70 mmHg   Blood Pressure (Exit) 100/60 mmHg   Heart Rate (Admit) 89 bpm   Heart Rate (Exercise) 94 bpm   Heart Rate (Exit) 90 bpm   Oxygen Saturation (Admit) 92 %   Oxygen Saturation (Exercise) 93 %   Oxygen Saturation (Exit) 94 %   Rating of Perceived Exertion (Exercise) 12   Perceived Dyspnea (Exercise) 0   Duration Progress to 45 minutes of aerobic exercise without signs/symptoms of physical distress   Intensity THRR unchanged   Progression   Progression Continue to progress workloads to maintain intensity without signs/symptoms of physical distress.   Resistance Training   Training Prescription Yes   Weight blue bands   Reps 10-12   Interval Training   Interval Training No   Recumbant Bike    Level 3   Minutes 15   Track   Laps 11   Minutes 15     Goals Met:  Improved SOB with ADL's Using PLB without cueing & demonstrates good technique Exercise tolerated well Strength training completed today  Goals Unmet:  Not Applicable  Comments: Service time is from 1330 to 1550    Dr. Rush Farmer is Medical Director for Pulmonary Rehab at [facility]

## 2015-12-08 ENCOUNTER — Other Ambulatory Visit: Payer: Self-pay | Admitting: Cardiovascular Disease

## 2015-12-12 ENCOUNTER — Encounter (HOSPITAL_COMMUNITY)
Admission: RE | Admit: 2015-12-12 | Discharge: 2015-12-12 | Disposition: A | Discharge status: Home / Self Care | Payer: Medicare Other | Source: Ambulatory Visit | Attending: Pulmonary Disease | Admitting: Pulmonary Disease

## 2015-12-12 VITALS — Wt 257.3 lb

## 2015-12-12 DIAGNOSIS — J449 Chronic obstructive pulmonary disease, unspecified: Secondary | ICD-10-CM | POA: Diagnosis not present

## 2015-12-12 DIAGNOSIS — J9611 Chronic respiratory failure with hypoxia: Secondary | ICD-10-CM

## 2015-12-12 DIAGNOSIS — D869 Sarcoidosis, unspecified: Secondary | ICD-10-CM

## 2015-12-12 NOTE — Progress Notes (Signed)
Daily Session Note  Patient Details  Name: Travis Ferguson MRN: [id number] Date of Birth: Jan 18, 1941 Referring Provider:        Pulmonary Rehab Walk Test from 11/02/2015 in [place]   Referring Provider  Dr. Halford Chessman      Encounter Date: 12/12/2015  Check In:     Session Check In - 12/12/15 1349    Check-In   [facility]   Staff Present Su Hilt, MS, ACSM RCEP, Exercise Physiologist;Joan Leonia Reeves, RN, Roque Cash, RN   Supervising physician immediately available to respond to emergencies [facility] immediately available   Physician(s) Dr. Marily Memos   Medication changes reported     No   Fall or balance concerns reported    No   Warm-up and Cool-down Not performed (comment)   Resistance Training Performed Yes   VAD Patient? No   Pain Assessment   Currently in Pain? No/denies   Multiple Pain Sites No      Capillary Blood Glucose: No results found for this or any previous visit (from the past 24 hour(s)).      Exercise Prescription Changes - 12/12/15 1500    Response to Exercise   Blood Pressure (Admit) 102/56 mmHg   Blood Pressure (Exercise) 100/60 mmHg   Blood Pressure (Exit) 102/60 mmHg   Heart Rate (Admit) 90 bpm   Heart Rate (Exercise) 90 bpm   Heart Rate (Exit) 86 bpm   Oxygen Saturation (Admit) 93 %   Oxygen Saturation (Exercise) 94 %   Oxygen Saturation (Exit) 94 %   Rating of Perceived Exertion (Exercise) 13   Perceived Dyspnea (Exercise) 0   Duration Progress to 45 minutes of aerobic exercise without signs/symptoms of physical distress   Intensity THRR unchanged   Progression   Progression Continue to progress workloads to maintain intensity without signs/symptoms of physical distress.   Resistance Training   Training Prescription Yes   Weight blue bands   Reps 10-12   Interval Training   Interval Training No   Recumbant Bike   Level 3   Minutes 15   NuStep   Level 4   Minutes  15   METs 1.7   Track   Laps 12   Minutes 15     Goals Met:  Exercise tolerated well No report of cardiac concerns or symptoms Strength training completed today  Goals Unmet:  Not Applicable  Comments: Service time is from 1330 to 1505    Dr. Rush Farmer is Medical Director for Pulmonary Rehab at [facility].

## 2015-12-14 ENCOUNTER — Encounter (HOSPITAL_COMMUNITY)
Admission: RE | Admit: 2015-12-14 | Discharge: 2015-12-14 | Disposition: A | Discharge status: Home / Self Care | Payer: Medicare Other | Source: Ambulatory Visit | Attending: Pulmonary Disease | Admitting: Pulmonary Disease

## 2015-12-14 VITALS — Wt 256.4 lb

## 2015-12-14 DIAGNOSIS — J9611 Chronic respiratory failure with hypoxia: Secondary | ICD-10-CM

## 2015-12-14 DIAGNOSIS — J449 Chronic obstructive pulmonary disease, unspecified: Secondary | ICD-10-CM | POA: Diagnosis not present

## 2015-12-14 NOTE — Progress Notes (Signed)
Daily Session Note  Patient Details  Name: Travis Ferguson MRN: [id number] Date of Birth: Dec 15, 1940 Referring Provider:        Pulmonary Rehab Walk Test from 11/02/2015 in [place]   Referring Provider  Dr. Halford Chessman      Encounter Date: 12/14/2015  Check In:     Session Check In - 12/14/15 1636    Check-In   [facility]   Staff Present Rosebud Poles, RN, BSN;Lisa Ysidro Evert, RN;Molly diVincenzo, MS, ACSM RCEP, Exercise Physiologist   Supervising physician immediately available to respond to emergencies [facility] immediately available   Physician(s) Dr. Marily Memos   Medication changes reported     No   Fall or balance concerns reported    No   Warm-up and Cool-down Performed as group-led instruction   Resistance Training Performed Yes   VAD Patient? No   Pain Assessment   Currently in Pain? No/denies   Multiple Pain Sites No      Capillary Blood Glucose: No results found for this or any previous visit (from the past 24 hour(s)).      Exercise Prescription Changes - 12/14/15 1600    Response to Exercise   Blood Pressure (Admit) 98/62 mmHg   Blood Pressure (Exercise) 124/82 mmHg   Blood Pressure (Exit) 104/64 mmHg   Heart Rate (Admit) 112 bpm   Heart Rate (Exercise) 109 bpm   Heart Rate (Exit) 97 bpm   Oxygen Saturation (Admit) 91 %   Oxygen Saturation (Exercise) 94 %   Oxygen Saturation (Exit) 95 %   Rating of Perceived Exertion (Exercise) 13   Perceived Dyspnea (Exercise) 0   Duration Progress to 45 minutes of aerobic exercise without signs/symptoms of physical distress   Intensity THRR unchanged   Progression   Progression Continue to progress workloads to maintain intensity without signs/symptoms of physical distress.   Resistance Training   Training Prescription Yes   Weight blue bands   Reps 10-12   Interval Training   Interval Training No   Recumbant Bike   Level 3   Minutes 15   NuStep   Level 4    Minutes 15   METs 1.6   Track   Laps 12   Minutes 15     Goals Met:  Improved SOB with ADL's Using PLB without cueing & demonstrates good technique Exercise tolerated well Strength training completed today  Goals Unmet:  Not Applicable  Comments: Service time is from 1330 to 1500   Dr. Rush Farmer is Medical Director for Pulmonary Rehab at [facility].

## 2015-12-19 ENCOUNTER — Encounter (HOSPITAL_COMMUNITY)
Admission: RE | Admit: 2015-12-19 | Discharge: 2015-12-19 | Disposition: A | Discharge status: Home / Self Care | Payer: Medicare Other | Source: Ambulatory Visit | Attending: Pulmonary Disease | Admitting: Pulmonary Disease

## 2015-12-19 VITALS — Wt 257.7 lb

## 2015-12-19 DIAGNOSIS — D869 Sarcoidosis, unspecified: Secondary | ICD-10-CM

## 2015-12-19 DIAGNOSIS — J449 Chronic obstructive pulmonary disease, unspecified: Secondary | ICD-10-CM | POA: Diagnosis not present

## 2015-12-19 DIAGNOSIS — J9611 Chronic respiratory failure with hypoxia: Secondary | ICD-10-CM

## 2015-12-19 NOTE — Progress Notes (Signed)
Daily Session Note  Patient Details  Name: SEAB AXEL MRN: [id number] Date of Birth: 1941/04/02 Referring Provider:        Pulmonary Rehab Walk Test from 11/02/2015 in [place]   Referring Provider  Dr. Halford Chessman      Encounter Date: 12/19/2015  Check In:     Session Check In - 12/19/15 1347    Check-In   [facility]   Staff Present Su Hilt, MS, ACSM RCEP, Exercise Physiologist;Portia Rollene Rotunda, RN, BSN;Lisa Ysidro Evert, RN;Hedaya Latendresse Leonia Reeves, RN, BSN   Supervising physician immediately available to respond to emergencies [facility] immediately available   Physician(s) Dr. Marily Memos   Medication changes reported     No   Fall or balance concerns reported    No   Warm-up and Cool-down Performed as group-led instruction   Resistance Training Performed Yes   VAD Patient? No   Pain Assessment   Currently in Pain? No/denies   Multiple Pain Sites No      Capillary Blood Glucose: No results found for this or any previous visit (from the past 24 hour(s)).      Exercise Prescription Changes - 12/19/15 1600    Response to Exercise   Blood Pressure (Admit) 90/64 mmHg   Blood Pressure (Exercise) 100/74 mmHg   Blood Pressure (Exit) 98/60 mmHg   Heart Rate (Admit) 92 bpm   Heart Rate (Exercise) 99 bpm   Heart Rate (Exit) 91 bpm   Oxygen Saturation (Admit) 95 %   Oxygen Saturation (Exercise) 94 %   Oxygen Saturation (Exit) 95 %   Rating of Perceived Exertion (Exercise) 13   Perceived Dyspnea (Exercise) 1   Duration Progress to 45 minutes of aerobic exercise without signs/symptoms of physical distress   Intensity THRR unchanged   Progression   Progression Continue to progress workloads to maintain intensity without signs/symptoms of physical distress.   Resistance Training   Training Prescription Yes   Weight blue bands   Reps 10-12   Interval Training   Interval Training No   Recumbant Bike   Level 3   Minutes 17    NuStep   Level 4   Minutes 17   METs 1.6   Track   Laps 11   Minutes 17     Goals Met:  Improved SOB with ADL's Using PLB without cueing & demonstrates good technique Exercise tolerated well Strength training completed today  Goals Unmet:  Not Applicable  Comments: Service time is from 1330 to 1505    Dr. Rush Farmer is Medical Director for Pulmonary Rehab at [facility]

## 2015-12-21 ENCOUNTER — Encounter (HOSPITAL_COMMUNITY): Payer: Medicare Other

## 2015-12-26 ENCOUNTER — Encounter (HOSPITAL_COMMUNITY): Payer: Medicare Other

## 2015-12-28 ENCOUNTER — Encounter (HOSPITAL_COMMUNITY)
Admission: RE | Admit: 2015-12-28 | Discharge: 2015-12-28 | Disposition: A | Discharge status: Home / Self Care | Payer: Medicare Other | Source: Ambulatory Visit | Attending: Pulmonary Disease | Admitting: Pulmonary Disease

## 2015-12-28 VITALS — Wt 258.4 lb

## 2015-12-28 DIAGNOSIS — Z79899 Other long term (current) drug therapy: Secondary | ICD-10-CM | POA: Insufficient documentation

## 2015-12-28 DIAGNOSIS — J449 Chronic obstructive pulmonary disease, unspecified: Secondary | ICD-10-CM | POA: Insufficient documentation

## 2015-12-28 DIAGNOSIS — J9611 Chronic respiratory failure with hypoxia: Secondary | ICD-10-CM | POA: Insufficient documentation

## 2015-12-28 DIAGNOSIS — K219 Gastro-esophageal reflux disease without esophagitis: Secondary | ICD-10-CM | POA: Insufficient documentation

## 2015-12-28 DIAGNOSIS — E785 Hyperlipidemia, unspecified: Secondary | ICD-10-CM | POA: Diagnosis not present

## 2015-12-28 NOTE — Progress Notes (Signed)
Daily Session Note  Patient Details  Name: Travis Ferguson MRN: [id number] Date of Birth: 22-Feb-1941 Referring Provider:        Pulmonary Rehab Walk Test from 11/02/2015 in [place]   Referring Provider  Dr. Halford Chessman      Encounter Date: 12/28/2015  Check In:     Session Check In - 12/28/15 1402    Check-In   [facility]   Staff Present Rosebud Poles, RN, BSN;Velna Hedgecock Ysidro Evert, Felipe Drone, RN, MHA;Molly diVincenzo, MS, ACSM RCEP, Exercise Physiologist   Supervising physician immediately available to respond to emergencies [facility] immediately available   Physician(s) Dr. Cruzita Lederer   Medication changes reported     No   Fall or balance concerns reported    No   Warm-up and Cool-down Performed as group-led instruction   Resistance Training Performed Yes   VAD Patient? No   Pain Assessment   Currently in Pain? No/denies   Multiple Pain Sites No      Capillary Blood Glucose: No results found for this or any previous visit (from the past 24 hour(s)).      Exercise Prescription Changes - 12/28/15 1600    Response to Exercise   Blood Pressure (Admit) 108/66 mmHg   Blood Pressure (Exercise) 102/66 mmHg   Blood Pressure (Exit) 100/64 mmHg   Heart Rate (Admit) 107 bpm   Heart Rate (Exercise) 100 bpm   Heart Rate (Exit) 95 bpm   Oxygen Saturation (Admit) 95 %   Oxygen Saturation (Exercise) 93 %   Oxygen Saturation (Exit) 95 %   Rating of Perceived Exertion (Exercise) 12   Perceived Dyspnea (Exercise) 1   Duration Progress to 45 minutes of aerobic exercise without signs/symptoms of physical distress   Intensity THRR unchanged   Progression   Progression Continue to progress workloads to maintain intensity without signs/symptoms of physical distress.   Resistance Training   Training Prescription Yes   Weight blue bands   Reps 10-12   Interval Training   Interval Training No   Recumbant Bike   Level 3   Minutes 17   NuStep   Level 4   Minutes 17   METs 1.7     Goals Met:  Exercise tolerated well No report of cardiac concerns or symptoms Strength training completed today  Goals Unmet:  Not Applicable  Comments: Service time is from 1330 to 1530    Dr. Rush Farmer is Medical Director for Pulmonary Rehab at [facility].

## 2016-01-02 ENCOUNTER — Encounter (HOSPITAL_COMMUNITY)
Admission: RE | Admit: 2016-01-02 | Discharge: 2016-01-02 | Disposition: A | Discharge status: Home / Self Care | Payer: Medicare Other | Source: Ambulatory Visit | Attending: Pulmonary Disease | Admitting: Pulmonary Disease

## 2016-01-02 VITALS — Wt 259.5 lb

## 2016-01-02 DIAGNOSIS — J449 Chronic obstructive pulmonary disease, unspecified: Secondary | ICD-10-CM | POA: Diagnosis not present

## 2016-01-02 DIAGNOSIS — J9611 Chronic respiratory failure with hypoxia: Secondary | ICD-10-CM

## 2016-01-02 DIAGNOSIS — D869 Sarcoidosis, unspecified: Secondary | ICD-10-CM

## 2016-01-02 NOTE — Progress Notes (Signed)
Daily Session Note  Patient Details  Name: Travis Ferguson MRN: [id number] Date of Birth: 12/17/1940 Referring Provider:        Pulmonary Rehab Walk Test from 11/02/2015 in [place]   Referring Provider  Dr. Halford Chessman      Encounter Date: 01/02/2016  Check In:     Session Check In - 01/02/16 1338    Check-In   Location [facility]   Staff Present Rodney Langton, RN;Joan Leonia Reeves, RN, Luisa Hart, RN, BSN;Ramon Dredge, RN, [facility]   Supervising physician immediately available to respond to emergencies [facility] immediately available   Physician(s) Dr. Marily Memos   Medication changes reported     No   Fall or balance concerns reported    No   Warm-up and Cool-down Performed as group-led instruction   Resistance Training Performed Yes   VAD Patient? No   Pain Assessment   Currently in Pain? No/denies   Multiple Pain Sites No      Capillary Blood Glucose: No results found for this or any previous visit (from the past 24 hour(s)).      Exercise Prescription Changes - 01/02/16 1500    Response to Exercise   Blood Pressure (Admit) 112/70 mmHg   Blood Pressure (Exercise) 120/80 mmHg   Blood Pressure (Exit) 84/60 mmHg  recheck BP 97/65   Heart Rate (Admit) 100 bpm   Heart Rate (Exercise) 110 bpm   Heart Rate (Exit) 88 bpm   Oxygen Saturation (Admit) 95 %   Oxygen Saturation (Exercise) 93 %   Oxygen Saturation (Exit) 95 %   Rating of Perceived Exertion (Exercise) 13   Perceived Dyspnea (Exercise) 0   Duration Progress to 45 minutes of aerobic exercise without signs/symptoms of physical distress   Intensity THRR unchanged   Progression   Progression Continue to progress workloads to maintain intensity without signs/symptoms of physical distress.   Resistance Training   Training Prescription Yes   Weight blue bands   Reps 10-12   Interval Training   Interval Training No   Recumbant Bike   Level 1  ot reduced  workload for today   Minutes 17   NuStep   Level 4   Minutes 17   METs 1.7   Track   Laps 11   Minutes 17     Goals Met:  Exercise tolerated well No report of cardiac concerns or symptoms Strength training completed today  Goals Unmet:  Not Applicable  Comments: Service time is from 1330 to 1515    Dr. Rush Farmer is Medical Director for Pulmonary Rehab at [facility].

## 2016-01-04 ENCOUNTER — Encounter (HOSPITAL_COMMUNITY)
Admission: RE | Admit: 2016-01-04 | Discharge: 2016-01-04 | Disposition: A | Discharge status: Home / Self Care | Payer: Medicare Other | Source: Ambulatory Visit | Attending: Pulmonary Disease | Admitting: Pulmonary Disease

## 2016-01-04 NOTE — Progress Notes (Signed)
Pulmonary Individual Treatment Plan  Patient Details  Name: Travis Ferguson MRN: [id number] Date of Birth: 1940-12-14 Referring Provider:        Pulmonary Rehab Walk Test from 11/02/2015 in [place]   Referring Provider  Dr. Halford Chessman      Initial Encounter Date:       Pulmonary Rehab Walk Test from 11/02/2015 in [place]   Date  11/02/15   Referring Provider  Dr. Halford Chessman      Visit Diagnosis: No diagnosis found.  Patient's Home Medications on Admission:   Current outpatient prescriptions:  .  albuterol (PROVENTIL) (2.5 MG/3ML) 0.083% nebulizer solution, Take 3 mLs (2.5 mg total) by nebulization every 4 (four) hours as needed for wheezing or shortness of breath., Disp: 75 mL, Rfl: 12 .  budesonide-formoterol (SYMBICORT) 160-4.5 MCG/ACT inhaler, Inhale 2 puffs into the lungs at bedtime., Disp: 2 Inhaler, Rfl: 0 .  ENTRESTO 24-26 MG, TAKE 1 TABLET BY MOUTH 2 (TWO) TIMES DAILY., Disp: 180 tablet, Rfl: 1 .  finasteride (PROSCAR) 5 MG tablet, Take 5 mg by mouth daily. , Disp: , Rfl:  .  FLUoxetine (PROZAC) 40 MG capsule, Take 40 mg by mouth daily with breakfast. , Disp: , Rfl:  .  furosemide (LASIX) 20 MG tablet, Take 1 tablet (20 mg total) by mouth daily., Disp: 30 tablet, Rfl: 4 .  HYDROcodone-acetaminophen (NORCO/VICODIN) 5-325 MG per tablet, Take 2 tablets by mouth 2 (two) times daily as needed (for pain). (Patient taking differently: Take 2 tablets by mouth every 6 (six) hours as needed (for pain). ), Disp: 30 tablet, Rfl: 0 .  metoprolol tartrate (LOPRESSOR) 25 MG tablet, TAKE 1 TABLET (25 MG TOTAL) BY MOUTH 2 (TWO) TIMES DAILY., Disp: 90 tablet, Rfl: 1 .  Multiple Vitamin (MULTIVITAMIN) tablet, Take 1 tablet by mouth daily., Disp: , Rfl:  .  OXYGEN, Inhale 2 L/min into the lungs at bedtime., Disp: , Rfl:  .  PROAIR HFA 108 (90 BASE) MCG/ACT inhaler, 1-2 PUFFS EVERY 4-6 HOURS AS NEEDED (Patient taking differently: 1-2 PUFFS  EVERY 4-6 HOURS AS NEEDED for shortness of breath), Disp: 8.5 Inhaler, Rfl: 1 .  simvastatin (ZOCOR) 20 MG tablet, Take 20 mg by mouth every evening. , Disp: , Rfl:  .  sodium chloride (OCEAN) 0.65 % SOLN nasal spray, Place 1-2 sprays into both nostrils daily as needed for congestion., Disp: , Rfl:  .  spironolactone (ALDACTONE) 25 MG tablet, Take 1 tablet (25 mg total) by mouth daily., Disp: 30 tablet, Rfl: 4 .  traZODone (DESYREL) 100 MG tablet, Take 1 tablet (100 mg total) by mouth at bedtime., Disp: 15 tablet, Rfl: 0  Past Medical History: Past Medical History  Diagnosis Date  . Depression   . PTSD (post-traumatic stress disorder)   . GERD (gastroesophageal reflux disease)   . Dyslipidemia   . Rhinitis   . Cervical disc disease   . Subdural hematoma ([place]) 11/10    a. 2010 s/p surgery - diagnosed several weeks after a fall.  . Hypertension   . Coronary artery calcification seen on CAT scan   . Sarcoidosis ([place])   . Atherosclerosis   . Dyspnea     chronic  . Chronic lower back pain     chronic back pain/under pain management  . Inguinal hernia     right side  . Colon polyps     adenomatous and hyperplastic  . Sepsis ([place])   . Gallstones   .  Sensation problem     right side-lateral hip to knee" decreased sensation and tingling feeling" -has informed Dr. Joylene Draft, Dr. Henrene Pastor, Dr. Lucia Gaskins of this.  . Encounter for biliary drainage tube placement     remains with drainage bag to right side for  biliary drainage.  . Sinus congestion     10-27-14 at present some issues-"not bad"  . History of oxygen administration     @ 2 l/m nasally at bedtime.(09/22/2015)  . Hiatal hernia     sensation problem ("right lateral side hip to knee").  . Anxiety   . Frequent urination   . Colon polyps 08/02/2014    Tubular adenoma x 3, Hyperplastic-1  . Diastolic dysfunction     grade 1 diastolic dysfunction on 2-D echo  . Left ventricular dysfunction     ejection fraction of 25-30% with regional  wall motion abnormalities.  . Calculus of gallbladder with acute cholecystitis and obstruction   . Gangrenous cholecystitis 08/17/2014  . Steroid-induced hyperglycemia 02/25/2015  . Acute on chronic combined systolic and diastolic CHF (congestive heart failure) ([place]) 02/24/2015  . OSA (obstructive sleep apnea)     does not use CPAP; "just oxygen" (09/22/2015)  . Pneumonia ~ 1958  . CAP (community acquired pneumonia) 02/24/2015  . History of blood transfusion "numerous"  . COPD (chronic obstructive pulmonary disease) ([place])     a. Former Airline pilot, also smoked a pipe.  . Migraine     "years since I've had one" (09/22/2015)  . Arthritis     "about q joint I've got" (09/22/2015)    Tobacco Use: History  Smoking status  . Former Smoker -- 0.00 packs/day for 30 years  . Types: Pipe  . Quit date: 06/24/2008  Smokeless tobacco  . Never Used    Labs: Recent Review Flowsheet Data    Labs for ITP Cardiac and Pulmonary Rehab Latest Ref Rng 05/11/2009 08/19/2014 01/23/2015 01/23/2015 02/24/2015   PHART 7.350 - 7.450 7.375 7.373 7.409 - 7.295(L)   PCO2ART 35.0 - 45.0 mmHg 36.6 33.5(L) 34.6(L) - 44.0   HCO3 20.0 - 24.0 mEq/L 20.9 19.1(L) 21.9 23.7 20.8   TCO2 0 - 100 mmol/L 22.0 16.'9 23 25 '$ 18.7   ACIDBASEDEF 0.0 - 2.0 mmol/L 3.4(H) 4.8(H) 2.0 1.0 5.2(H)   O2SAT - 98.9 93.4 94.0 68.0 99.5      Capillary Blood Glucose: Lab Results  Component Value Date   GLUCAP 121* 02/27/2015   GLUCAP 170* 02/26/2015   GLUCAP 133* 02/26/2015   GLUCAP 146* 02/26/2015   GLUCAP 153* 02/26/2015     ADL UCSD:     Pulmonary Assessment Scores      11/08/15 1523       ADL UCSD   ADL Phase Entry     SOB Score total 15        Pulmonary Function Assessment:     Pulmonary Function Assessment - 10/30/15 0947    Breath   Bilateral Breath Sounds Clear   Shortness of Breath Limiting activity;Yes      Exercise Target Goals:    Exercise Program Goal: Individual exercise prescription set with THRR, safety  & activity barriers. Participant demonstrates ability to understand and report RPE using BORG scale, to self-measure pulse accurately, and to acknowledge the importance of the exercise prescription.  Exercise Prescription Goal: Starting with aerobic activity 30 plus minutes a day, 3 days per week for initial exercise prescription. Provide home exercise prescription and guidelines that participant acknowledges understanding prior to discharge.  Activity Barriers &  Risk Stratification:     Activity Barriers & Cardiac Risk Stratification - 10/30/15 0945    Activity Barriers & Cardiac Risk Stratification   Activity Barriers Deconditioning;Shortness of Breath;Balance Concerns;Assistive Device;Joint Problems      6 Minute Walk:     6 Minute Walk      11/02/15 1631       6 Minute Walk   Phase Initial     Distance 1050 feet     Walk Time 6 minutes     MPH 1.98     RPE 13     VO2 Peak 7.14     Symptoms Yes (comment)     Comments knee pain     Resting HR 90 bpm     Resting BP 120/60 mmHg     Max Ex. HR 123 bpm     Max Ex. BP 140/80 mmHg     Interval HR   3 Minute HR 123     Interval Heart Rate? Yes     Interval Oxygen   Interval Oxygen? Yes     Baseline Liters of Oxygen 0 L     1 Minute Liters of Oxygen 0 L     2 Minute Liters of Oxygen 0 L     3 Minute Liters of Oxygen 0 L     4 Minute Oxygen Saturation % 89 %     4 Minute Liters of Oxygen 0 L     5 Minute Liters of Oxygen 0 L     6 Minute Oxygen Saturation % 90 %     6 Minute Liters of Oxygen 0 L     2 Minute Post Liters of Oxygen 0 L        Initial Exercise Prescription:     Initial Exercise Prescription - 11/02/15 1600    Date of Initial Exercise RX and Referring Provider   Date 11/02/15   Referring Provider Dr. Halford Chessman   Oxygen   Oxygen --  room air   Bike   Level 0.5   Minutes 15   NuStep   Level 2   Minutes 15   METs 1.5   Track   Laps 5   Minutes 15   Prescription Details   Frequency (times per  week) 2   Duration Progress to 45 minutes of aerobic exercise without signs/symptoms of physical distress   Intensity   THRR 40-80% of Max Heartrate 58-116   Ratings of Perceived Exertion 11-13   Perceived Dyspnea 0-4   Progression   Progression Continue progressive overload as per policy without signs/symptoms or physical distress.   Resistance Training   Training Prescription Yes   Weight blue bands   Reps 10-12      Perform Capillary Blood Glucose checks as needed.  Exercise Prescription Changes:     Exercise Prescription Changes      11/14/15 1500 11/16/15 1600 11/21/15 1520 11/23/15 1600 11/28/15 1500   Exercise Review   Progression  Yes Yes Yes Yes   Response to Exercise   Blood Pressure (Admit) 104/64 mmHg 97/69 mmHg 96/64 mmHg 92/60 mmHg 121/88 mmHg   Blood Pressure (Exercise) 116/74 mmHg 112/60 mmHg 104/60 mmHg 112/80 mmHg 110/60 mmHg   Blood Pressure (Exit) 112/60 mmHg 95/64 mmHg 104/60 mmHg 106/66 mmHg 111/73 mmHg   Heart Rate (Admit) 83 bpm 74 bpm 87 bpm 92 bpm 91 bpm   Heart Rate (Exercise) 85 bpm 92 bpm 90 bpm 84 bpm 93 bpm   Heart Rate (Exit)  77 bpm 80 bpm 87 bpm 86 bpm 86 bpm   Oxygen Saturation (Admit) 95 % 95 % 95 % 92 % 93 %   Oxygen Saturation (Exercise) 92 % 94 % 93 % 94 % 90 %   Oxygen Saturation (Exit) 97 % 96 % 96 % 96 % 96 %   Rating of Perceived Exertion (Exercise) '11 11 13 11 12   '$ Perceived Dyspnea (Exercise) 1 0 2 0 1   Duration Progress to 45 minutes of aerobic exercise without signs/symptoms of physical distress Progress to 45 minutes of aerobic exercise without signs/symptoms of physical distress Progress to 45 minutes of aerobic exercise without signs/symptoms of physical distress Progress to 45 minutes of aerobic exercise without signs/symptoms of physical distress Progress to 45 minutes of aerobic exercise without signs/symptoms of physical distress   Intensity THRR unchanged THRR unchanged THRR unchanged THRR unchanged THRR unchanged    Progression   Progression Continue to progress workloads to maintain intensity without signs/symptoms of physical distress. Continue to progress workloads to maintain intensity without signs/symptoms of physical distress. Continue to progress workloads to maintain intensity without signs/symptoms of physical distress. Continue to progress workloads to maintain intensity without signs/symptoms of physical distress. Continue to progress workloads to maintain intensity without signs/symptoms of physical distress.   Resistance Training   Training Prescription Yes Yes Yes Yes Yes   Weight blue bands blue bands blue bands blue bands blue bands   Reps 10-12 10-12 10-12 10-12 10-12   Interval Training   Interval Training No No No No No   Bike   Level 0.5       Minutes 15       Recumbant Bike   Level  '1 2 2 2   '$ Minutes  '15 15 15 15   '$ NuStep   Level '2 3 3 3 4   '$ Minutes '15 15 15 15 15   '$ METs 1.3 1.5 1.5 1.6 1.5   Track   Laps '8  10  11   '$ Minutes '15  15  15     '$ 11/30/15 1500 11/30/15 1600 12/05/15 1600 12/07/15 1700 12/12/15 1500   Exercise Review   Progression    Yes    Response to Exercise   Blood Pressure (Admit)  94/62 mmHg 108/54 mmHg 120/80 mmHg 102/56 mmHg   Blood Pressure (Exercise)  100/60 mmHg 100/60 mmHg 112/70 mmHg 100/60 mmHg   Blood Pressure (Exit)  92/58 mmHg 102/60 mmHg 100/60 mmHg 102/60 mmHg   Heart Rate (Admit)  84 bpm 99 bpm 89 bpm 90 bpm   Heart Rate (Exercise)  89 bpm 92 bpm 94 bpm 90 bpm   Heart Rate (Exit)  82 bpm 95 bpm 90 bpm 86 bpm   Oxygen Saturation (Admit)  95 % 93 % 92 % 93 %   Oxygen Saturation (Exercise)  94 % 93 % 93 % 94 %   Oxygen Saturation (Exit)  94 % 95 % 94 % 94 %   Rating of Perceived Exertion (Exercise)  '11 12 12 13   '$ Perceived Dyspnea (Exercise)  2 1 0 0   Comments reviewed home exercise program       Duration  Progress to 45 minutes of aerobic exercise without signs/symptoms of physical distress Progress to 45 minutes of aerobic exercise without  signs/symptoms of physical distress Progress to 45 minutes of aerobic exercise without signs/symptoms of physical distress Progress to 45 minutes of aerobic exercise without signs/symptoms of physical distress   Intensity  THRR  unchanged THRR unchanged THRR unchanged THRR unchanged   Progression   Progression  Continue to progress workloads to maintain intensity without signs/symptoms of physical distress. Continue to progress workloads to maintain intensity without signs/symptoms of physical distress. Continue to progress workloads to maintain intensity without signs/symptoms of physical distress. Continue to progress workloads to maintain intensity without signs/symptoms of physical distress.   Resistance Training   Training Prescription  Yes Yes Yes Yes   Weight  blue bands blue bands blue bands blue bands   Reps  10-12 10-12 10-12 10-12   Interval Training   Interval Training  No No No No   Recumbant Bike   Level  '2 2 3 3   '$ Minutes  '15 15 15 15   '$ NuStep   Level  '4 4  4   '$ Minutes  '15 15  15   '$ METs  1 1.5  1.7   Track   Laps   '4 11 12   '$ Minutes   '15 15 15     '$ 12/14/15 1600 12/19/15 1600 12/28/15 1600 01/02/16 1500     Response to Exercise   Blood Pressure (Admit) 98/62 mmHg 90/64 mmHg 108/66 mmHg 112/70 mmHg    Blood Pressure (Exercise) 124/82 mmHg 100/74 mmHg 102/66 mmHg 120/80 mmHg    Blood Pressure (Exit) 104/64 mmHg 98/60 mmHg 100/64 mmHg 84/60 mmHg  recheck BP 97/65    Heart Rate (Admit) 112 bpm 92 bpm 107 bpm 100 bpm    Heart Rate (Exercise) 109 bpm 99 bpm 100 bpm 110 bpm    Heart Rate (Exit) 97 bpm 91 bpm 95 bpm 88 bpm    Oxygen Saturation (Admit) 91 % 95 % 95 % 95 %    Oxygen Saturation (Exercise) 94 % 94 % 93 % 93 %    Oxygen Saturation (Exit) 95 % 95 % 95 % 95 %    Rating of Perceived Exertion (Exercise) '13 13 12 13    '$ Perceived Dyspnea (Exercise) 0 1 1 0    Duration Progress to 45 minutes of aerobic exercise without signs/symptoms of physical distress Progress to 45  minutes of aerobic exercise without signs/symptoms of physical distress Progress to 45 minutes of aerobic exercise without signs/symptoms of physical distress Progress to 45 minutes of aerobic exercise without signs/symptoms of physical distress    Intensity THRR unchanged THRR unchanged THRR unchanged THRR unchanged    Progression   Progression Continue to progress workloads to maintain intensity without signs/symptoms of physical distress. Continue to progress workloads to maintain intensity without signs/symptoms of physical distress. Continue to progress workloads to maintain intensity without signs/symptoms of physical distress. Continue to progress workloads to maintain intensity without signs/symptoms of physical distress.    Resistance Training   Training Prescription Yes Yes Yes Yes    Weight blue bands blue bands blue bands blue bands    Reps 10-12 10-12 10-12 10-12    Interval Training   Interval Training No No No No    Recumbant Bike   Level '3 3 3 1  '$ ot reduced workload for today    Minutes '15 17 17 17    '$ NuStep   Level '4 4 4 4    '$ Minutes '15 17 17 17    '$ METs 1.6 1.6 1.7 1.7    Track   Laps '12 11  11    '$ Minutes '15 17  17       '$ Exercise Comments:     Exercise Comments  11/30/15 1525 12/07/15 0847 01/04/16 0853       Exercise Comments Reviewed home exercise with patient. Patient will exercise at home 2-3 days a week for 30 minutes walk/bike.  Patient is limited in workload intensity due to orthopedic ailments. Still attempts to progress intensity. Will exercise at home with wife.  Exercising at home, has an appointment with orthopedic physician today to address shouler pain.  Still attempting to progress workloads        Discharge Exercise Prescription (Final Exercise Prescription Changes):     Exercise Prescription Changes - 01/02/16 1500    Response to Exercise   Blood Pressure (Admit) 112/70 mmHg   Blood Pressure (Exercise) 120/80 mmHg   Blood Pressure (Exit)  84/60 mmHg  recheck BP 97/65   Heart Rate (Admit) 100 bpm   Heart Rate (Exercise) 110 bpm   Heart Rate (Exit) 88 bpm   Oxygen Saturation (Admit) 95 %   Oxygen Saturation (Exercise) 93 %   Oxygen Saturation (Exit) 95 %   Rating of Perceived Exertion (Exercise) 13   Perceived Dyspnea (Exercise) 0   Duration Progress to 45 minutes of aerobic exercise without signs/symptoms of physical distress   Intensity THRR unchanged   Progression   Progression Continue to progress workloads to maintain intensity without signs/symptoms of physical distress.   Resistance Training   Training Prescription Yes   Weight blue bands   Reps 10-12   Interval Training   Interval Training No   Recumbant Bike   Level 1  ot reduced workload for today   Minutes 17   NuStep   Level 4   Minutes 17   METs 1.7   Track   Laps 11   Minutes 17       Nutrition:  Target Goals: Understanding of nutrition guidelines, daily intake of sodium '1500mg'$ , cholesterol '200mg'$ , calories 30% from fat and 7% or less from saturated fats, daily to have 5 or more servings of fruits and vegetables.  Biometrics:     Pre Biometrics - 10/30/15 0947    Pre Biometrics   Grip Strength 28 kg       Nutrition Therapy Plan and Nutrition Goals:     Nutrition Therapy & Goals - 11/30/15 1538    Nutrition Therapy   Diet General, Healthful    Personal Nutrition Goals   Personal Goal #1 0.5-2 lb wt loss per week to a goal wt of 6-24 lb at graduation from [place], educate and counsel regarding individualized specific dietary modifications aiming towards targeted core components such as weight, hypertension, lipid management, diabetes, heart failure and other comorbidities.   Expected Outcomes Short Term Goal: Understand basic principles of dietary content, such as calories, fat, sodium, cholesterol and nutrients.;Long Term Goal: Adherence to prescribed nutrition plan.       Nutrition Discharge: Rate Your Plate Scores:     Nutrition Assessments - 11/30/15 1539    MEDFICTS Scores   Pre Score 51      Psychosocial: Target Goals: Acknowledge presence or absence of depression, maximize coping skills, provide positive support system. Participant is able to verbalize types and ability to use techniques and skills needed for reducing stress and depression.  Initial Review & Psychosocial Screening:     Initial Psych Review & Screening - 10/30/15 0953    Initial Review   Current issues with History of Depression;Current Stress Concerns   Source of Stress Concerns Chronic Illness;Unable to perform yard/household activities  Family Dynamics   Good Support System? Yes   Barriers   Psychosocial barriers to participate in program Psychosocial barriers identified (see note)   Screening Interventions   Interventions Encouraged to exercise      Quality of Life Scores:     Quality of Life - 11/08/15 1522    Quality of Life Scores   Health/Function Pre 15.47 %   Socioeconomic Pre 26.79 %   Psych/Spiritual Pre 28 %   Family Pre 22.38 %   GLOBAL Pre 21.36 %      PHQ-9:     Recent Review Flowsheet Data    Depression screen [facility] 2/9 10/30/2015 06/12/2015 03/13/2015 12/23/2014   Decreased Interest 0 0 0 0   Down, Depressed, Hopeless 0 '3 1 1   '$ PHQ - 2 Score 0 '3 1 1   '$ Altered sleeping - 3 - -   Tired, decreased energy - 3 - -   Change in appetite - 0 - -   Feeling bad or failure about yourself  - 0 - -   Trouble concentrating - 0 - -   Moving slowly or fidgety/restless - 0 - -   Suicidal thoughts - 0 - -   PHQ-9 Score - 9 - -   Difficult doing work/chores - Somewhat difficult - -      Psychosocial Evaluation and Intervention:     Psychosocial Evaluation - 10/30/15 0954    Psychosocial Evaluation & Interventions   Interventions Encouraged to exercise with the program and follow exercise prescription      Psychosocial Re-Evaluation:      Psychosocial Re-Evaluation      11/28/15 1638 01/01/16 1208         Psychosocial Re-Evaluation   Interventions Encouraged to attend Pulmonary Rehabilitation for the exercise Encouraged to attend Pulmonary Rehabilitation for the exercise      Comments Mental outlook is more positive than in the past.  Exercise seems to have improved mental outlook. No psychosocial concerns identified at this time.      Continued Psychosocial Services Needed Yes No        Education: Education Goals: Education classes will be provided on a weekly basis, covering required topics. Participant will state understanding/return demonstration of topics presented.  Learning Barriers/Preferences:     Learning Barriers/Preferences - 10/30/15 0946    Learning Barriers/Preferences   Learning Barriers None   Learning Preferences Group Instruction;Individual Instruction;Verbal Instruction;Skilled Demonstration;Written Material      Education Topics: Risk Factor Reduction:  -Group instruction that is supported by a PowerPoint presentation. Instructor discusses the definition of a risk factor, different risk factors for pulmonary disease, and how the heart and lungs work together.     Nutrition for Pulmonary Patient:  -Group instruction provided by PowerPoint slides, verbal discussion, and written materials to support subject matter. The instructor gives an explanation and review of healthy diet recommendations, which includes a discussion on weight management, recommendations for fruit and vegetable consumption, as well as protein, fluid, caffeine, fiber, sodium, sugar, and alcohol. Tips for eating when patients are short of breath are discussed.          PULMONARY REHAB OTHER RESPIRATORY from 12/28/2015 in [place]   Date  11/23/15   Educator  RD   Instruction Review Code  2- meets goals/outcomes      Pursed Lip Breathing:  -Group instruction that is supported by  demonstration and informational handouts. Instructor discusses the benefits of pursed lip and diaphragmatic  breathing and detailed demonstration on how to preform both.        PULMONARY REHAB OTHER RESPIRATORY from 12/28/2015 in [place]   Date  11/16/15   Educator  EP   Instruction Review Code  2- meets goals/outcomes      Oxygen Safety:  -Group instruction provided by PowerPoint, verbal discussion, and written material to support subject matter. There is an overview of "What is Oxygen" and "Why do we need it".  Instructor also reviews how to create a safe environment for oxygen use, the importance of using oxygen as prescribed, and the risks of noncompliance. There is a brief discussion on traveling with oxygen and resources the patient may utilize.      PULMONARY REHAB OTHER RESPIRATORY from 12/28/2015 in [place]   Date  12/07/15   Educator  RN   Instruction Review Code  2- meets goals/outcomes      Oxygen Equipment:  -Group instruction provided by [facility] Staff utilizing handouts, written materials, and equipment demonstrations.   Signs and Symptoms:  -Group instruction provided by written material and verbal discussion to support subject matter. Warning signs and symptoms of infection, stroke, and heart attack are reviewed and when to call the physician/911 reinforced. Tips for preventing the spread of infection discussed.   Advanced Directives:  -Group instruction provided by verbal instruction and written material to support subject matter. Instructor reviews Advanced Directive laws and proper instruction for filling out document.   Pulmonary Video:  -Group video education that reviews the importance of medication and oxygen compliance, exercise, good nutrition, pulmonary hygiene, and pursed lip and diaphragmatic breathing for the pulmonary patient.      PULMONARY REHAB OTHER RESPIRATORY from 12/28/2015 in [place]   Date  11/30/15   Instruction Review Code  2- meets goals/outcomes      Exercise for the Pulmonary Patient:  -Group instruction that is supported by a PowerPoint presentation. Instructor discusses benefits of exercise, core components of exercise, frequency, duration, and intensity of an exercise routine, importance of utilizing pulse oximetry during exercise, safety while exercising, and options of places to exercise outside of rehab.        PULMONARY REHAB OTHER RESPIRATORY from 12/28/2015 in [place]   Date  12/28/15   Educator  EP   Instruction Review Code  2- meets goals/outcomes      Pulmonary Medications:  -Verbally interactive group education provided by instructor with focus on inhaled medications and proper administration.   Anatomy and Physiology of the Respiratory System and Intimacy:  -Group instruction provided by PowerPoint, verbal discussion, and written material to support subject matter. Instructor reviews respiratory cycle and anatomical components of the respiratory system and their functions. Instructor also reviews differences in obstructive and restrictive respiratory diseases with examples of each. Intimacy, Sex, and Sexuality differences are reviewed with a discussion on how relationships can change when diagnosed with pulmonary disease. Common sexual concerns are reviewed.   Knowledge Questionnaire Score:     Knowledge Questionnaire Score - 11/08/15 1522    Knowledge Questionnaire Score   Pre Score 10/13      Core Components/Risk Factors/Patient Goals at Admission:     Personal Goals and Risk Factors at Admission - 10/30/15 0949    Core Components/Risk Factors/Patient Goals on Admission    Weight Management Obesity;Yes   Intervention Weight Management: Develop a combined nutrition and exercise program designed  to reach desired caloric intake, while maintaining appropriate intake of  nutrient and fiber, sodium and fats, and appropriate energy expenditure required for the weight goal.;Weight Management: Provide education and appropriate resources to help participant work on and attain dietary goals.;Weight Management/Obesity: Establish reasonable short term and long term weight goals.;Obesity: Provide education and appropriate resources to help participant work on and attain dietary goals.   Admit Weight 264 lb 1.8 oz (119.8 kg)   Expected Outcomes Short Term: Continue to assess and modify interventions until short term weight is achieved;Long Term: Adherence to nutrition and physical activity/exercise program aimed toward attainment of established weight goal   Sedentary Yes   Intervention Provide advice, education, support and counseling about physical activity/exercise needs.;Develop an individualized exercise prescription for aerobic and resistive training based on initial evaluation findings, risk stratification, comorbidities and participant's personal goals.   Expected Outcomes Achievement of increased cardiorespiratory fitness and enhanced flexibility, muscular endurance and strength shown through measurements of functional capacity and personal statement of participant.   Increase Strength and Stamina Yes   Intervention Provide advice, education, support and counseling about physical activity/exercise needs.;Develop an individualized exercise prescription for aerobic and resistive training based on initial evaluation findings, risk stratification, comorbidities and participant's personal goals.   Expected Outcomes Achievement of increased cardiorespiratory fitness and enhanced flexibility, muscular endurance and strength shown through measurements of functional capacity and personal statement of participant.   Improve shortness of breath with ADL's Yes   Intervention Provide education, individualized exercise plan and daily activity instruction to help decrease symptoms of SOB  with activities of daily living.   Expected Outcomes Short Term: Achieves a reduction of symptoms when performing activities of daily living.   Develop more efficient breathing techniques such as purse lipped breathing and diaphragmatic breathing; and practicing self-pacing with activity Yes   Intervention Provide education, demonstration and support about specific breathing techniuqes utilized for more efficient breathing. Include techniques such as pursed lipped breathing, diaphragmatic breathing and self-pacing activity.   Expected Outcomes Short Term: Participant will be able to demonstrate and use breathing techniques as needed throughout daily activities.   Increase knowledge of respiratory medications and ability to use respiratory devices properly  Yes   Intervention Provide education and demonstration as needed of appropriate use of medications, inhalers, and oxygen therapy.   Expected Outcomes Short Term: Achieves understanding of medications use. Understands that oxygen is a medication prescribed by physician. Demonstrates appropriate use of inhaler and oxygen therapy.   Heart Failure Yes   Intervention Provide a combined exercise and nutrition program that is supplemented with education, support and counseling about heart failure. Directed toward relieving symptoms such as shortness of breath, decreased exercise tolerance, and extremity edema.   Expected Outcomes Improve functional capacity of life;Short term: Attendance in program 2-3 days a week with increased exercise capacity. Reported lower sodium intake. Reported increased fruit and vegetable intake. Reports medication compliance.;Short term: Daily weights obtained and reported for increase. Utilizing diuretic protocols set by physician.;Long term: Adoption of self-care skills and reduction of barriers for early signs and symptoms recognition and intervention leading to self-care maintenance.   Personal Goal Other Yes   Personal Goal to  become more mobile with less shortness of breath   Intervention increase work loads on equiptment as tolerated inorder to increase stamina and strength   Expected Outcomes patient will report he is able to do more physical activities with less shortness of breath      Core Components/Risk Factors/Patient Goals Review:  Goals and Risk Factor Review      11/28/15 1633 01/01/16 1203         Core Components/Risk Factors/Patient Goals Review   Personal Goals Review Increase Strength and Stamina;Improve shortness of breath with ADL's Increase Strength and Stamina;Improve shortness of breath with ADL's      Review Just started program, this is the 1st 30 days in program, no great change in strength and stamina yet Definite improvement in strength and stamina, half-way through program, improvement in mental attitude with exercise      Expected Outcomes Should see a greater improvement in strength and stamina in the next 30 days with increasing workloads as  Continue to increase workloads as tolerated the next 30 days to continue to see improvements.         Core Components/Risk Factors/Patient Goals at Discharge (Final Review):      Goals and Risk Factor Review - 01/01/16 1203    Core Components/Risk Factors/Patient Goals Review   Personal Goals Review Increase Strength and Stamina;Improve shortness of breath with ADL's   Review Definite improvement in strength and stamina, half-way through program, improvement in mental attitude with exercise   Expected Outcomes Continue to increase workloads as tolerated the next 30 days to continue to see improvements.      ITP Comments:   Comments: ITP REVIEW Pt is making expected progress toward personal goals after completing 13 sessions.   Recommend continued exercise, life style modification, education, and utilization of breathing techniques to increase stamina and strength and decrease shortness of breath with exertion.

## 2016-01-09 ENCOUNTER — Encounter (HOSPITAL_COMMUNITY)
Admission: RE | Admit: 2016-01-09 | Discharge: 2016-01-09 | Disposition: A | Discharge status: Home / Self Care | Payer: Medicare Other | Source: Ambulatory Visit | Attending: Pulmonary Disease | Admitting: Pulmonary Disease

## 2016-01-09 VITALS — Wt 260.4 lb

## 2016-01-09 DIAGNOSIS — J449 Chronic obstructive pulmonary disease, unspecified: Secondary | ICD-10-CM | POA: Diagnosis not present

## 2016-01-09 DIAGNOSIS — J9611 Chronic respiratory failure with hypoxia: Secondary | ICD-10-CM

## 2016-01-09 NOTE — Progress Notes (Signed)
Daily Session Note  Patient Details  Name: Travis Ferguson MRN: [id number] Date of Birth: [birth year] Referring Provider:        Pulmonary Rehab Walk Test from 11/02/2015 in [place]   Referring Provider  Dr. Halford Chessman      Encounter Date: 01/09/2016  Check In:     Session Check In - 01/09/16 1606    Check-In   Location [facility]   Staff Present Rosebud Poles, RN, BSN;Lisa Ysidro Evert, RN;Portia Rollene Rotunda, RN, BSN;Ramon Dredge, RN, MHA;Molly diVincenzo, MS, ACSM RCEP, Exercise Physiologist   Supervising physician immediately available to respond to emergencies [facility] immediately available   Physician(s) Dr. Marily Memos   Medication changes reported     No   Fall or balance concerns reported    No   Warm-up and Cool-down Performed as group-led instruction   Resistance Training Performed Yes   VAD Patient? No   Pain Assessment   Currently in Pain? No/denies   Multiple Pain Sites No      Capillary Blood Glucose: No results found for this or any previous visit (from the past 24 hour(s)).      Exercise Prescription Changes - 01/09/16 1600    Response to Exercise   Blood Pressure (Admit) 110/70 mmHg   Blood Pressure (Exercise) 100/60 mmHg   Blood Pressure (Exit) 100/60 mmHg   Heart Rate (Admit) 110 bpm   Heart Rate (Exercise) 112 bpm   Heart Rate (Exit) 113 bpm   Oxygen Saturation (Admit) 96 %   Oxygen Saturation (Exercise) 92 %   Oxygen Saturation (Exit) 97 %   Rating of Perceived Exertion (Exercise) 13   Perceived Dyspnea (Exercise) 0   Duration Progress to 45 minutes of aerobic exercise without signs/symptoms of physical distress   Intensity THRR unchanged   Progression   Progression Continue to progress workloads to maintain intensity without signs/symptoms of physical distress.   Resistance Training   Training Prescription Yes   Weight blue bands   Reps 10-12  10 minutes of strength training   Interval Training    Interval Training No   Recumbant Bike   Level 1  reduced workload d/t being tired and not sleeping last night   Minutes 17   NuStep   Level 4   Minutes 17   METs 1.6   Track   Laps 9   Minutes 17     Goals Met:  Exercise tolerated well Strength training completed today  Goals Unmet:  Not Applicable  Comments: Service time is from 1330 to 1500    Dr. Rush Farmer is Medical Director for Pulmonary Rehab at [facility].

## 2016-01-11 ENCOUNTER — Encounter (HOSPITAL_COMMUNITY)
Admission: RE | Admit: 2016-01-11 | Discharge: 2016-01-11 | Disposition: A | Discharge status: Home / Self Care | Payer: Medicare Other | Source: Ambulatory Visit | Attending: Pulmonary Disease | Admitting: Pulmonary Disease

## 2016-01-11 VITALS — Wt 259.5 lb

## 2016-01-11 DIAGNOSIS — J449 Chronic obstructive pulmonary disease, unspecified: Secondary | ICD-10-CM | POA: Diagnosis not present

## 2016-01-11 DIAGNOSIS — J9611 Chronic respiratory failure with hypoxia: Secondary | ICD-10-CM

## 2016-01-11 NOTE — Progress Notes (Signed)
Daily Session Note  Patient Details  Name: [name] MRN: [id number] Date of Birth: October 21, 1940 Referring Provider:        Pulmonary Rehab Walk Test from 11/02/2015 in [place]   Referring Provider  Dr. Halford Chessman      Encounter Date: 01/11/2016  Check In:     Session Check In - 01/11/16 1356    Check-In   [facility]   Staff Present Rosebud Poles, RN, BSN;Lisa Ysidro Evert, RN;Brevan Luberto Rollene Rotunda, RN, BSN;Ramon Dredge, RN, MHA;Molly diVincenzo, MS, ACSM RCEP, Exercise Physiologist   Supervising physician immediately available to respond to emergencies [facility] immediately available   Physician(s) Dr. Cruzita Lederer   Medication changes reported     No   Fall or balance concerns reported    No   Warm-up and Cool-down Performed as group-led instruction   Resistance Training Performed Yes   VAD Patient? No   Pain Assessment   Currently in Pain? No/denies   Multiple Pain Sites No      Capillary Blood Glucose: No results found for this or any previous visit (from the past 24 hour(s)).      Exercise Prescription Changes - 01/11/16 1647    Response to Exercise   Blood Pressure (Admit) 100/56 mmHg   Blood Pressure (Exercise) 110/70 mmHg   Blood Pressure (Exit) 100/60 mmHg   Heart Rate (Admit) 106 bpm   Heart Rate (Exercise) 116 bpm   Heart Rate (Exit) 100 bpm   Oxygen Saturation (Admit) 95 %   Oxygen Saturation (Exercise) 94 %   Oxygen Saturation (Exit) 95 %   Rating of Perceived Exertion (Exercise) 13   Perceived Dyspnea (Exercise) 1   Duration Progress to 45 minutes of aerobic exercise without signs/symptoms of physical distress   Intensity THRR unchanged   Progression   Progression Continue to progress workloads to maintain intensity without signs/symptoms of physical distress.   Resistance Training   Training Prescription Yes   Weight blue bands   Reps 10-12  10 minutes of strength training   Interval Training    Interval Training No   Recumbant Bike   Level 3   Minutes 17   Track   Laps 9   Minutes 17     Goals Met:  Improved SOB with ADL's Using PLB without cueing & demonstrates good technique Exercise tolerated well No report of cardiac concerns or symptoms Strength training completed today  Goals Unmet:  Not Applicable  Comments: Service time is from 1330 to 1540   Dr. Rush Farmer is Medical Director for Pulmonary Rehab at [facility].

## 2016-01-16 ENCOUNTER — Encounter (HOSPITAL_COMMUNITY)
Admission: RE | Admit: 2016-01-16 | Discharge: 2016-01-16 | Disposition: A | Discharge status: Home / Self Care | Payer: Medicare Other | Source: Ambulatory Visit | Attending: Pulmonary Disease | Admitting: Pulmonary Disease

## 2016-01-16 VITALS — Wt 261.5 lb

## 2016-01-16 DIAGNOSIS — J9611 Chronic respiratory failure with hypoxia: Secondary | ICD-10-CM

## 2016-01-16 DIAGNOSIS — J449 Chronic obstructive pulmonary disease, unspecified: Secondary | ICD-10-CM | POA: Diagnosis not present

## 2016-01-16 DIAGNOSIS — D869 Sarcoidosis, unspecified: Secondary | ICD-10-CM

## 2016-01-16 NOTE — Progress Notes (Signed)
Daily Session Note  Patient Details  Name: Travis Ferguson MRN: [id number] Date of Birth: 1941/05/09 Referring Provider:   April Manson Pulmonary Rehab Walk Test from 11/02/2015 in [place]  Referring Provider  Dr. Halford Chessman      Encounter Date: 01/16/2016  Check In:   Capillary Blood Glucose: No results found for this or any previous visit (from the past 24 hour(s)).      Exercise Prescription Changes - 01/16/16 1500      Response to Exercise   Blood Pressure (Admit) 100/64   Blood Pressure (Exercise) 110/74   Blood Pressure (Exit) 100/60   Heart Rate (Admit) 101 bpm   Heart Rate (Exercise) 100 bpm   Heart Rate (Exit) 97 bpm   Oxygen Saturation (Admit) 92 %   Oxygen Saturation (Exercise) 95 %   Oxygen Saturation (Exit) 95 %   Rating of Perceived Exertion (Exercise) 13   Perceived Dyspnea (Exercise) 1   Duration Progress to 45 minutes of aerobic exercise without signs/symptoms of physical distress   Intensity THRR unchanged     Progression   Progression Continue to progress workloads to maintain intensity without signs/symptoms of physical distress.     Resistance Training   Training Prescription Yes   Weight blue bands   Reps 10-12  10 minutes of strength training     Recumbant Bike   Level 3   Minutes 17     NuStep   Level 4   Minutes 17   METs 1.3     Track   Laps 9   Minutes 17     Goals Met:  Exercise tolerated well No report of cardiac concerns or symptoms Strength training completed today  Goals Unmet:  Not Applicable  Comments: Service time is from 1330 to 1510    Dr. Rush Farmer is Medical Director for Pulmonary Rehab at [facility].

## 2016-01-18 ENCOUNTER — Encounter (HOSPITAL_COMMUNITY)
Admission: RE | Admit: 2016-01-18 | Discharge: 2016-01-18 | Disposition: A | Discharge status: Home / Self Care | Payer: Medicare Other | Source: Ambulatory Visit | Attending: Pulmonary Disease | Admitting: Pulmonary Disease

## 2016-01-18 VITALS — Wt 262.1 lb

## 2016-01-18 DIAGNOSIS — J449 Chronic obstructive pulmonary disease, unspecified: Secondary | ICD-10-CM | POA: Diagnosis not present

## 2016-01-18 DIAGNOSIS — D869 Sarcoidosis, unspecified: Secondary | ICD-10-CM

## 2016-01-18 DIAGNOSIS — J9611 Chronic respiratory failure with hypoxia: Secondary | ICD-10-CM

## 2016-01-18 NOTE — Progress Notes (Signed)
Daily Session Note  Patient Details  Name: Travis Ferguson MRN: [id number] Date of Birth: February 09, 1941 Referring Provider:   April Manson Pulmonary Rehab Walk Test from 11/02/2015 in [place]  Referring Provider  Dr. Halford Chessman      Encounter Date: 01/18/2016  Check In:     Session Check In - 01/18/16 1333      Check-In   Location [facility]   Staff Present Rosebud Poles, RN, BSN;Molly diVincenzo, MS, ACSM RCEP, Exercise Physiologist;Annedrea Rosezella [place], RN, MHA;Portia Rollene Rotunda, RN, BSN   Supervising physician immediately available to respond to emergencies [facility] immediately available   Physician(s) Dr. Waldron Labs   Medication changes reported     No   Fall or balance concerns reported    No   Warm-up and Cool-down Performed as group-led instruction   Resistance Training Performed Yes   VAD Patient? No     Pain Assessment   Currently in Pain? No/denies   Multiple Pain Sites No      Capillary Blood Glucose: No results found for this or any previous visit (from the past 24 hour(s)).      Exercise Prescription Changes - 01/18/16 1500      Response to Exercise   Blood Pressure (Admit) 105/71   Blood Pressure (Exercise) 104/60   Blood Pressure (Exit) 100/60   Heart Rate (Admit) 93 bpm   Heart Rate (Exercise) 96 bpm   Heart Rate (Exit) 99 bpm   Oxygen Saturation (Admit) 94 %   Oxygen Saturation (Exercise) 95 %   Oxygen Saturation (Exit) 96 %   Rating of Perceived Exertion (Exercise) 11   Perceived Dyspnea (Exercise) 0   Duration Progress to 45 minutes of aerobic exercise without signs/symptoms of physical distress   Intensity THRR unchanged     Progression   Progression Continue to progress workloads to maintain intensity without signs/symptoms of physical distress.     Resistance Training   Training Prescription Yes   Weight blue bands   Reps 10-12  10 minutes of strength training     Interval Training   Interval Training No     Recumbant Bike   Level 3   Minutes 17     NuStep   Level 4   Minutes 17   METs 1.7     Goals Met:  Improved SOB with ADL's Exercise tolerated well Strength training completed today  Goals Unmet:  Not Applicable  Comments: Service time is from 1330 to 1505    Dr. Rush Farmer is Medical Director for Pulmonary Rehab at [facility].

## 2016-01-23 ENCOUNTER — Encounter (HOSPITAL_COMMUNITY)
Admission: RE | Admit: 2016-01-23 | Discharge: 2016-01-23 | Disposition: A | Discharge status: Home / Self Care | Payer: Medicare Other | Source: Ambulatory Visit | Attending: Pulmonary Disease | Admitting: Pulmonary Disease

## 2016-01-23 VITALS — Wt 260.6 lb

## 2016-01-23 DIAGNOSIS — D869 Sarcoidosis, unspecified: Secondary | ICD-10-CM

## 2016-01-23 DIAGNOSIS — J449 Chronic obstructive pulmonary disease, unspecified: Secondary | ICD-10-CM | POA: Insufficient documentation

## 2016-01-23 DIAGNOSIS — J9611 Chronic respiratory failure with hypoxia: Secondary | ICD-10-CM | POA: Diagnosis not present

## 2016-01-23 DIAGNOSIS — E785 Hyperlipidemia, unspecified: Secondary | ICD-10-CM | POA: Insufficient documentation

## 2016-01-23 DIAGNOSIS — K219 Gastro-esophageal reflux disease without esophagitis: Secondary | ICD-10-CM | POA: Diagnosis not present

## 2016-01-23 DIAGNOSIS — Z79899 Other long term (current) drug therapy: Secondary | ICD-10-CM | POA: Diagnosis not present

## 2016-01-23 NOTE — Progress Notes (Signed)
Daily Session Note  Patient Details  Name: Travis Ferguson MRN: [id number] Date of Birth: 1941-03-06 Referring Provider:   April Manson Pulmonary Rehab Walk Test from 11/02/2015 in [place]  Referring Provider  Dr. Halford Chessman      Encounter Date: 01/23/2016  Check In:     Session Check In - 01/23/16 1336      Check-In   [facility]   Staff Present Rosebud Poles, RN, BSN;Lisa Ysidro Evert, RN;Portia Rollene Rotunda, RN, BSN;Molly diVincenzo, MS, ACSM RCEP, Exercise Physiologist   Supervising physician immediately available to respond to emergencies [facility] immediately available   Physician(s) Dr. Marily Memos   Medication changes reported     No   Fall or balance concerns reported    No   Warm-up and Cool-down Performed as group-led instruction   Resistance Training Performed Yes   VAD Patient? No     Pain Assessment   Currently in Pain? No/denies   Multiple Pain Sites No      Capillary Blood Glucose: No results found for this or any previous visit (from the past 24 hour(s)).      Exercise Prescription Changes - 01/23/16 1500      Response to Exercise   Blood Pressure (Admit) 96/60   Blood Pressure (Exercise) 96/68   Blood Pressure (Exit) 92/60   Heart Rate (Admit) 102 bpm   Heart Rate (Exercise) 107 bpm   Heart Rate (Exit) 108 bpm   Oxygen Saturation (Admit) 93 %   Oxygen Saturation (Exercise) 92 %   Oxygen Saturation (Exit) 95 %   Rating of Perceived Exertion (Exercise) 13   Perceived Dyspnea (Exercise) 0   Duration Progress to 45 minutes of aerobic exercise without signs/symptoms of physical distress   Intensity THRR unchanged     Progression   Progression Continue to progress workloads to maintain intensity without signs/symptoms of physical distress.     Resistance Training   Training Prescription Yes   Weight blue bands   Reps 10-12  10 minutes of strength training     Interval Training   Interval Training  No     Recumbant Bike   Level 3   Minutes 17     NuStep   Level 4   Minutes 17   METs 2.1     Track   Laps 9   Minutes 17     Goals Met:  Improved SOB with ADL's Using PLB without cueing & demonstrates good technique Exercise tolerated well Strength training completed today  Goals Unmet:  Not Applicable  Comments: Service time is from 1330 to 1500    Dr. Rush Farmer is Medical Director for Pulmonary Rehab at [facility].

## 2016-01-25 ENCOUNTER — Encounter (HOSPITAL_COMMUNITY)
Admission: RE | Admit: 2016-01-25 | Discharge: 2016-01-25 | Disposition: A | Discharge status: Home / Self Care | Payer: Medicare Other | Source: Ambulatory Visit | Attending: Pulmonary Disease | Admitting: Pulmonary Disease

## 2016-01-25 VITALS — Wt 261.9 lb

## 2016-01-25 DIAGNOSIS — J449 Chronic obstructive pulmonary disease, unspecified: Secondary | ICD-10-CM | POA: Diagnosis not present

## 2016-01-25 DIAGNOSIS — D869 Sarcoidosis, unspecified: Secondary | ICD-10-CM

## 2016-01-25 DIAGNOSIS — J9611 Chronic respiratory failure with hypoxia: Secondary | ICD-10-CM

## 2016-01-25 NOTE — Progress Notes (Signed)
Daily Session Note  Patient Details  Name: Travis Ferguson MRN: [id number] Date of Birth: 07-17-1940 Referring Provider:   April Manson Pulmonary Rehab Walk Test from 11/02/2015 in [place]  Referring Provider  Dr. Halford Chessman      Encounter Date: 01/25/2016  Check In:     Session Check In - 01/25/16 1347      Check-In   Location [facility]   Staff Present Rosebud Poles, RN, BSN;Molly diVincenzo, MS, ACSM RCEP, Exercise Physiologist;Portia Rollene Rotunda, RN, BSN   Supervising physician immediately available to respond to emergencies [facility] immediately available   Physician(s) Dr. Marily Memos   Medication changes reported     No   Fall or balance concerns reported    No   Warm-up and Cool-down Performed as group-led instruction   Resistance Training Performed Yes   VAD Patient? No     Pain Assessment   Currently in Pain? No/denies   Multiple Pain Sites No      Capillary Blood Glucose: No results found for this or any previous visit (from the past 24 hour(s)).      Exercise Prescription Changes - 01/25/16 1500      Response to Exercise   Blood Pressure (Admit) 112/64   Blood Pressure (Exercise) 116/80   Blood Pressure (Exit) 108/60   Heart Rate (Admit) 100 bpm   Heart Rate (Exercise) 101 bpm   Heart Rate (Exit) 93 bpm   Oxygen Saturation (Admit) 93 %   Oxygen Saturation (Exercise) 93 %   Oxygen Saturation (Exit) 97 %   Rating of Perceived Exertion (Exercise) 11   Perceived Dyspnea (Exercise) 0   Duration Progress to 45 minutes of aerobic exercise without signs/symptoms of physical distress   Intensity THRR unchanged     Progression   Progression Continue to progress workloads to maintain intensity without signs/symptoms of physical distress.     Resistance Training   Training Prescription Yes   Weight blue bands   Reps 10-12  10 minutes of strength training     Interval Training   Interval Training No     NuStep    Level 4   Minutes 17   METs 1.6     Track   Laps 9   Minutes 17     Goals Met:  Exercise tolerated well Strength training completed today  Goals Unmet:  Not Applicable  Comments: Service time is from 1330 to 1510    Dr. Rush Farmer is Medical Director for Pulmonary Rehab at [facility].

## 2016-01-26 ENCOUNTER — Telehealth: Payer: Self-pay | Admitting: *Deleted

## 2016-01-26 NOTE — Telephone Encounter (Signed)
Requesting surgical clearance:   1. Type of surgery: Left Elbow ulnar nerve release and/or transposition  2. Surgeon: Dr Iran Planas  3. Surgical date: 02/14/16  4. Medications that need to be held: n/a  5. CAD: Yes     6. I will defer to: Dr Leanne Chang [facility] Attn Santiago Bur Fax- [phone] Phone [phone]

## 2016-01-27 NOTE — Telephone Encounter (Signed)
Cleared for surgery at moderately increased CV risk due to severe LV dysfunction

## 2016-01-29 NOTE — Telephone Encounter (Signed)
Clearance routed to number provided.  

## 2016-01-30 ENCOUNTER — Encounter (HOSPITAL_COMMUNITY)
Admission: RE | Admit: 2016-01-30 | Discharge: 2016-01-30 | Disposition: A | Discharge status: Home / Self Care | Payer: Medicare Other | Source: Ambulatory Visit | Attending: Pulmonary Disease | Admitting: Pulmonary Disease

## 2016-01-30 VITALS — Wt 258.2 lb

## 2016-01-30 DIAGNOSIS — J9611 Chronic respiratory failure with hypoxia: Secondary | ICD-10-CM

## 2016-01-30 DIAGNOSIS — J449 Chronic obstructive pulmonary disease, unspecified: Secondary | ICD-10-CM | POA: Diagnosis not present

## 2016-01-30 DIAGNOSIS — D869 Sarcoidosis, unspecified: Secondary | ICD-10-CM

## 2016-01-30 NOTE — Progress Notes (Signed)
Daily Session Note  Patient Details  Name: Travis Ferguson MRN: [id number] Date of Birth: 11-14-1940 Referring Provider:   April Manson Pulmonary Rehab Walk Test from 11/02/2015 in [place]  Referring Provider  Dr. Halford Chessman      Encounter Date: 01/30/2016  Check In:     Session Check In - 01/30/16 1324      Check-In   Location [facility]   Staff Present Rosebud Poles, RN, BSN;Alley Neils Ysidro Evert, Felipe Drone, RN, MHA;Molly diVincenzo, MS, ACSM RCEP, Exercise Physiologist   Supervising physician immediately available to respond to emergencies [facility] immediately available   Physician(s) Dr. Marily Memos   Medication changes reported     No   Fall or balance concerns reported    No   Warm-up and Cool-down Performed as group-led instruction   Resistance Training Performed Yes   VAD Patient? No     Pain Assessment   Currently in Pain? No/denies   Multiple Pain Sites No      Capillary Blood Glucose: No results found for this or any previous visit (from the past 24 hour(s)).      Exercise Prescription Changes - 01/30/16 1500      Response to Exercise   Blood Pressure (Admit) 100/60   Blood Pressure (Exercise) 104/60   Blood Pressure (Exit) 100/70   Heart Rate (Admit) 96 bpm   Heart Rate (Exercise) 93 bpm   Heart Rate (Exit) 91 bpm   Oxygen Saturation (Admit) 95 %   Oxygen Saturation (Exercise) 93 %   Oxygen Saturation (Exit) 95 %   Rating of Perceived Exertion (Exercise) 13   Perceived Dyspnea (Exercise) 0   Duration Progress to 45 minutes of aerobic exercise without signs/symptoms of physical distress   Intensity THRR unchanged     Progression   Progression Continue to progress workloads to maintain intensity without signs/symptoms of physical distress.     Resistance Training   Training Prescription Yes   Weight blue bands   Reps 10-12  10 minutes of strength training     Interval Training   Interval  Training No     Recumbant Bike   Level 3   Minutes 17     NuStep   Level 4   Minutes 17   METs 1.6     Track   Laps 8   Minutes 17     Goals Met:  Exercise tolerated well No report of cardiac concerns or symptoms Strength training completed today  Goals Unmet:  Not Applicable  Comments: Service time is from 1330 to 1500    Dr. Rush Farmer is Medical Director for Pulmonary Rehab at [facility].

## 2016-01-31 ENCOUNTER — Telehealth: Payer: Self-pay | Admitting: Pulmonary Disease

## 2016-01-31 NOTE — Telephone Encounter (Signed)
Called spoke with pt. 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 Since he was having pain daily and was referred to pain management. Pt was taken off hydrocodone. Pt was RX'd nucynta 50 mg 1 tab po TID PRN.  Pt has not started taking this medication yet bc label states to discuss with your doctor if you have COPD/OSA. Pt reports PCP has okay 'd for him to take this but is wanting Dr. Juanetta Gosling opinion on him taking this. Please advise Dr. Halford Chessman thanks

## 2016-02-01 ENCOUNTER — Encounter (HOSPITAL_COMMUNITY)
Admission: RE | Admit: 2016-02-01 | Discharge: 2016-02-01 | Disposition: A | Discharge status: Home / Self Care | Payer: Medicare Other | Source: Ambulatory Visit | Attending: Pulmonary Disease | Admitting: Pulmonary Disease

## 2016-02-01 DIAGNOSIS — J9611 Chronic respiratory failure with hypoxia: Secondary | ICD-10-CM

## 2016-02-01 DIAGNOSIS — D869 Sarcoidosis, unspecified: Secondary | ICD-10-CM

## 2016-02-01 NOTE — Telephone Encounter (Signed)
Spoke with pt.  Advised it should be okay for him to take nucynta.  He should monitor for any changes in his breathing or alertness, and call if he has any changes.

## 2016-02-01 NOTE — Progress Notes (Signed)
Pulmonary Individual Treatment Plan  Patient Details  Name: Travis Ferguson MRN: [id number] Date of Birth: 25-Jul-1940 Referring Provider:   April Manson Pulmonary Rehab Walk Test from 11/02/2015 in [place]  Referring Provider  Dr. Halford Chessman      Initial Encounter Date:  Flowsheet Row Pulmonary Rehab Walk Test from 11/02/2015 in [facility]  Date  11/02/15  Referring Provider  Dr. Halford Chessman      Visit Diagnosis: Chronic respiratory failure with hypoxia ([place])  Sarcoidosis ([place])  Patient's Home Medications on Admission:   Current Outpatient Prescriptions:  .  albuterol (PROVENTIL) (2.5 MG/3ML) 0.083% nebulizer solution, Take 3 mLs (2.5 mg total) by nebulization every 4 (four) hours as needed for wheezing or shortness of breath., Disp: 75 mL, Rfl: 12 .  budesonide-formoterol (SYMBICORT) 160-4.5 MCG/ACT inhaler, Inhale 2 puffs into the lungs at bedtime., Disp: 2 Inhaler, Rfl: 0 .  ENTRESTO 24-26 MG, TAKE 1 TABLET BY MOUTH 2 (TWO) TIMES DAILY., Disp: 180 tablet, Rfl: 1 .  finasteride (PROSCAR) 5 MG tablet, Take 5 mg by mouth daily. , Disp: , Rfl:  .  FLUoxetine (PROZAC) 40 MG capsule, Take 40 mg by mouth daily with breakfast. , Disp: , Rfl:  .  furosemide (LASIX) 20 MG tablet, Take 1 tablet (20 mg total) by mouth daily., Disp: 30 tablet, Rfl: 4 .  HYDROcodone-acetaminophen (NORCO/VICODIN) 5-325 MG per tablet, Take 2 tablets by mouth 2 (two) times daily as needed (for pain). (Patient taking differently: Take 2 tablets by mouth every 6 (six) hours as needed (for pain). ), Disp: 30 tablet, Rfl: 0 .  metoprolol tartrate (LOPRESSOR) 25 MG tablet, TAKE 1 TABLET (25 MG TOTAL) BY MOUTH 2 (TWO) TIMES DAILY., Disp: 90 tablet, Rfl: 1 .  Multiple Vitamin (MULTIVITAMIN) tablet, Take 1 tablet by mouth daily., Disp: , Rfl:  .  OXYGEN, Inhale 2 L/min into the lungs at bedtime., Disp: , Rfl:  .  PROAIR HFA 108 (90 BASE) MCG/ACT inhaler, 1-2 PUFFS EVERY  4-6 HOURS AS NEEDED (Patient taking differently: 1-2 PUFFS EVERY 4-6 HOURS AS NEEDED for shortness of breath), Disp: 8.5 Inhaler, Rfl: 1 .  simvastatin (ZOCOR) 20 MG tablet, Take 20 mg by mouth every evening. , Disp: , Rfl:  .  sodium chloride (OCEAN) 0.65 % SOLN nasal spray, Place 1-2 sprays into both nostrils daily as needed for congestion., Disp: , Rfl:  .  spironolactone (ALDACTONE) 25 MG tablet, Take 1 tablet (25 mg total) by mouth daily., Disp: 30 tablet, Rfl: 4 .  traZODone (DESYREL) 100 MG tablet, Take 1 tablet (100 mg total) by mouth at bedtime., Disp: 15 tablet, Rfl: 0  Past Medical History: Past Medical History:  Diagnosis Date  . Acute on chronic combined systolic and diastolic CHF (congestive heart failure) ([place]) 02/24/2015  . Anxiety   . Arthritis    "about q joint I've got" (09/22/2015)  . Atherosclerosis   . Calculus of gallbladder with acute cholecystitis and obstruction   . CAP (community acquired pneumonia) 02/24/2015  . Cervical disc disease   . Chronic lower back pain    chronic back pain/under pain management  . Colon polyps    adenomatous and hyperplastic  . Colon polyps 08/02/2014   Tubular adenoma x 3, Hyperplastic-1  . COPD (chronic obstructive pulmonary disease) ([place])    a. Former Airline pilot, also smoked a pipe.  . Coronary artery calcification seen on CAT scan   . Depression   . Diastolic dysfunction  grade 1 diastolic dysfunction on 2-D echo  . Dyslipidemia   . Dyspnea    chronic  . Encounter for biliary drainage tube placement    remains with drainage bag to right side for  biliary drainage.  . Frequent urination   . Gallstones   . Gangrenous cholecystitis 08/17/2014  . GERD (gastroesophageal reflux disease)   . Hiatal hernia    sensation problem ("right lateral side hip to knee").  . History of blood transfusion "numerous"  . History of oxygen administration    @ 2 l/m nasally at bedtime.(09/22/2015)  . Hypertension   . Inguinal hernia    right  side  . Left ventricular dysfunction    ejection fraction of 25-30% with regional wall motion abnormalities.  . Migraine    "years since I've had one" (09/22/2015)  . OSA (obstructive sleep apnea)    does not use CPAP; "just oxygen" (09/22/2015)  . Pneumonia ~ 1958  . PTSD (post-traumatic stress disorder)   . Rhinitis   . Sarcoidosis ([place])   . Sensation problem    right side-lateral hip to knee" decreased sensation and tingling feeling" -has informed Dr. Joylene Draft, Dr. Henrene Pastor, Dr. Lucia Gaskins of this.  . Sepsis ([place])   . Sinus congestion    10-27-14 at present some issues-"not bad"  . Steroid-induced hyperglycemia 02/25/2015  . Subdural hematoma ([place]) 11/10   a. 2010 s/p surgery - diagnosed several weeks after a fall.    Tobacco Use: History  Smoking Status  . Former Smoker  . Packs/day: 0.00  . Years: 30.00  . Types: Pipe  . Quit date: 06/24/2008  Smokeless Tobacco  . Never Used    Labs: Recent Review Flowsheet Data    Labs for ITP Cardiac and Pulmonary Rehab Latest Ref Rng & Units 05/11/2009 08/19/2014 01/23/2015 01/23/2015 02/24/2015   PHART 7.350 - 7.450 7.375 7.373 7.409 - 7.295(L)   PCO2ART 35.0 - 45.0 mmHg 36.6 33.5(L) 34.6(L) - 44.0   HCO3 20.0 - 24.0 mEq/L 20.9 19.1(L) 21.9 23.7 20.8   TCO2 0 - 100 mmol/L 22.0 16.'9 23 25 '$ 18.7   ACIDBASEDEF 0.0 - 2.0 mmol/L 3.4(H) 4.8(H) 2.0 1.0 5.2(H)   O2SAT % 98.9 93.4 94.0 68.0 99.5      Capillary Blood Glucose: Lab Results  Component Value Date   GLUCAP 121 (H) 02/27/2015   GLUCAP 170 (H) 02/26/2015   GLUCAP 133 (H) 02/26/2015   GLUCAP 146 (H) 02/26/2015   GLUCAP 153 (H) 02/26/2015     ADL UCSD:   Pulmonary Function Assessment:     Pulmonary Function Assessment - 10/30/15 0947      Breath   Bilateral Breath Sounds Clear   Shortness of Breath Limiting activity;Yes      Exercise Target Goals:    Exercise Program Goal: Individual exercise prescription set with THRR, safety & activity barriers. Participant demonstrates  ability to understand and report RPE using BORG scale, to self-measure pulse accurately, and to acknowledge the importance of the exercise prescription.  Exercise Prescription Goal: Starting with aerobic activity 30 plus minutes a day, 3 days per week for initial exercise prescription. Provide home exercise prescription and guidelines that participant acknowledges understanding prior to discharge.  Activity Barriers & Risk Stratification:     Activity Barriers & Cardiac Risk Stratification - 10/30/15 0945      Activity Barriers & Cardiac Risk Stratification   Activity Barriers Deconditioning;Shortness of Breath;Balance Concerns;Assistive Device;Joint Problems      6 Minute Walk:     6  Minute Walk    Row Name 11/02/15 1631         6 Minute Walk   Phase Initial     Distance 1050 feet     Walk Time 6 minutes     MPH 1.98     RPE 13     VO2 Peak 7.14     Symptoms Yes (comment)     Comments knee pain     Resting HR 90 bpm     Resting BP 120/60     Max Ex. HR 123 bpm     Max Ex. BP 140/80       Interval HR   3 Minute HR 123     Interval Heart Rate? Yes       Interval Oxygen   Interval Oxygen? Yes     Baseline Liters of Oxygen 0 L     1 Minute Liters of Oxygen 0 L     2 Minute Liters of Oxygen 0 L     3 Minute Liters of Oxygen 0 L     4 Minute Oxygen Saturation % 89 %     4 Minute Liters of Oxygen 0 L     5 Minute Liters of Oxygen 0 L     6 Minute Oxygen Saturation % 90 %     6 Minute Liters of Oxygen 0 L     2 Minute Post Liters of Oxygen 0 L        Initial Exercise Prescription:     Initial Exercise Prescription - 11/02/15 1600      Date of Initial Exercise RX and Referring Provider   Date 11/02/15   Referring Provider Dr. Halford Chessman     Oxygen   Oxygen --  room air     Bike   Level 0.5   Minutes 15     NuStep   Level 2   Minutes 15   METs 1.5     Track   Laps 5   Minutes 15     Prescription Details   Frequency (times per week) 2   Duration  Progress to 45 minutes of aerobic exercise without signs/symptoms of physical distress     Intensity   THRR 40-80% of Max Heartrate 58-116   Ratings of Perceived Exertion 11-13   Perceived Dyspnea 0-4     Progression   Progression Continue progressive overload as per policy without signs/symptoms or physical distress.     Resistance Training   Training Prescription Yes   Weight blue bands   Reps 10-12      Perform Capillary Blood Glucose checks as needed.  Exercise Prescription Changes:     Exercise Prescription Changes    Row Name 11/14/15 1500 11/16/15 1600 11/21/15 1520 11/23/15 1600 11/28/15 1500     Exercise Review   Progression  - Yes Yes Yes Yes     Response to Exercise   Blood Pressure (Admit) 1'04/64 97/69 96/64 '$ 92/60 121/88   Blood Pressure (Exercise) 116/74 112/60 104/60 112/80 110/60   Blood Pressure (Exit) 112/60 95/64 104/60 106/66 111/73   Heart Rate (Admit) 83 bpm 74 bpm 87 bpm 92 bpm 91 bpm   Heart Rate (Exercise) 85 bpm 92 bpm 90 bpm 84 bpm 93 bpm   Heart Rate (Exit) 77 bpm 80 bpm 87 bpm 86 bpm 86 bpm   Oxygen Saturation (Admit) 95 % 95 % 95 % 92 % 93 %   Oxygen Saturation (Exercise) 92 % 94 % 93 %  94 % 90 %   Oxygen Saturation (Exit) 97 % 96 % 96 % 96 % 96 %   Rating of Perceived Exertion (Exercise) '11 11 13 11 12   '$ Perceived Dyspnea (Exercise) 1 0 2 0 1   Duration Progress to 45 minutes of aerobic exercise without signs/symptoms of physical distress Progress to 45 minutes of aerobic exercise without signs/symptoms of physical distress Progress to 45 minutes of aerobic exercise without signs/symptoms of physical distress Progress to 45 minutes of aerobic exercise without signs/symptoms of physical distress Progress to 45 minutes of aerobic exercise without signs/symptoms of physical distress   Intensity THRR unchanged THRR unchanged THRR unchanged THRR unchanged THRR unchanged     Progression   Progression Continue to progress workloads to maintain  intensity without signs/symptoms of physical distress. Continue to progress workloads to maintain intensity without signs/symptoms of physical distress. Continue to progress workloads to maintain intensity without signs/symptoms of physical distress. Continue to progress workloads to maintain intensity without signs/symptoms of physical distress. Continue to progress workloads to maintain intensity without signs/symptoms of physical distress.     Resistance Training   Training Prescription Yes Yes Yes Yes Yes   Weight blue bands blue bands blue bands blue bands blue bands   Reps 10-12 10-12 10-12 10-12 10-12     Interval Training   Interval Training No No No No No     Bike   Level 0.5  -  -  -  -   Minutes 15  -  -  -  -     Recumbant Bike   Level  - '1 2 2 2   '$ Minutes  - '15 15 15 15     '$ NuStep   Level '2 3 3 3 4   '$ Minutes '15 15 15 15 15   '$ METs 1.3 1.5 1.5 1.6 1.5     Track   Laps 8  - 10  - 11   Minutes 15  - 15  - 15   Row Name 11/30/15 1500 11/30/15 1600 12/05/15 1600 12/07/15 1700 12/12/15 1500     Exercise Review   Progression  -  -  - Yes  -     Response to Exercise   Blood Pressure (Admit)  - 94/62 108/54 120/80 102/56   Blood Pressure (Exercise)  - 100/60 100/60 112/70 100/60   Blood Pressure (Exit)  - 92/58 102/60 100/60 102/60   Heart Rate (Admit)  - 84 bpm 99 bpm 89 bpm 90 bpm   Heart Rate (Exercise)  - 89 bpm 92 bpm 94 bpm 90 bpm   Heart Rate (Exit)  - 82 bpm 95 bpm 90 bpm 86 bpm   Oxygen Saturation (Admit)  - 95 % 93 % 92 % 93 %   Oxygen Saturation (Exercise)  - 94 % 93 % 93 % 94 %   Oxygen Saturation (Exit)  - 94 % 95 % 94 % 94 %   Rating of Perceived Exertion (Exercise)  - '11 12 12 13   '$ Perceived Dyspnea (Exercise)  - 2 1 0 0   Comments reviewed home exercise program  -  -  -  -   Duration  - Progress to 45 minutes of aerobic exercise without signs/symptoms of physical distress Progress to 45 minutes of aerobic exercise without signs/symptoms of physical  distress Progress to 45 minutes of aerobic exercise without signs/symptoms of physical distress Progress to 45 minutes of aerobic exercise without signs/symptoms of physical distress  Intensity  - THRR unchanged THRR unchanged THRR unchanged THRR unchanged     Progression   Progression  - Continue to progress workloads to maintain intensity without signs/symptoms of physical distress. Continue to progress workloads to maintain intensity without signs/symptoms of physical distress. Continue to progress workloads to maintain intensity without signs/symptoms of physical distress. Continue to progress workloads to maintain intensity without signs/symptoms of physical distress.     Resistance Training   Training Prescription  - Yes Yes Yes Yes   Weight  - blue bands blue bands blue bands blue bands   Reps  - 10-12 10-12 10-12 10-12     Interval Training   Interval Training  - No No No No     Recumbant Bike   Level  - '2 2 3 3   '$ Minutes  - '15 15 15 15     '$ NuStep   Level  - 4 4  - 4   Minutes  - 15 15  - 15   METs  - 1 1.5  - 1.7     Track   Laps  -  - '4 11 12   '$ Minutes  -  - '15 15 15   '$ Row Name 12/14/15 1600 12/19/15 1600 12/28/15 1600 01/02/16 1500 01/09/16 1600     Response to Exercise   Blood Pressure (Admit) 98/62 90/64 108/66 112/70 110/70   Blood Pressure (Exercise) 124/82 100/74 102/66 120/80 100/60   Blood Pressure (Exit) 104/64 98/60 100/64 84/60  recheck BP 97/65 100/60   Heart Rate (Admit) 112 bpm 92 bpm 107 bpm 100 bpm 110 bpm   Heart Rate (Exercise) 109 bpm 99 bpm 100 bpm 110 bpm 112 bpm   Heart Rate (Exit) 97 bpm 91 bpm 95 bpm 88 bpm 113 bpm   Oxygen Saturation (Admit) 91 % 95 % 95 % 95 % 96 %   Oxygen Saturation (Exercise) 94 % 94 % 93 % 93 % 92 %   Oxygen Saturation (Exit) 95 % 95 % 95 % 95 % 97 %   Rating of Perceived Exertion (Exercise) '13 13 12 13 13   '$ Perceived Dyspnea (Exercise) 0 1 1 0 0   Duration Progress to 45 minutes of aerobic exercise without  signs/symptoms of physical distress Progress to 45 minutes of aerobic exercise without signs/symptoms of physical distress Progress to 45 minutes of aerobic exercise without signs/symptoms of physical distress Progress to 45 minutes of aerobic exercise without signs/symptoms of physical distress Progress to 45 minutes of aerobic exercise without signs/symptoms of physical distress   Intensity THRR unchanged THRR unchanged THRR unchanged THRR unchanged THRR unchanged     Progression   Progression Continue to progress workloads to maintain intensity without signs/symptoms of physical distress. Continue to progress workloads to maintain intensity without signs/symptoms of physical distress. Continue to progress workloads to maintain intensity without signs/symptoms of physical distress. Continue to progress workloads to maintain intensity without signs/symptoms of physical distress. Continue to progress workloads to maintain intensity without signs/symptoms of physical distress.     Resistance Training   Training Prescription Yes Yes Yes Yes Yes   Weight blue bands blue bands blue bands blue bands blue bands   Reps 10-12 10-12 10-12 10-12 10-12  10 minutes of strength training     Interval Training   Interval Training No No No No No     Recumbant Bike   Level '3 3 3 1  '$ ot reduced workload for today 1  reduced workload d/t  being tired and not sleeping last night   Minutes '15 17 17 17 17     '$ NuStep   Level '4 4 4 4 4   '$ Minutes '15 17 17 17 17   '$ METs 1.6 1.6 1.7 1.7 1.6     Track   Laps 12 11  - 11 9   Minutes 15 17  - 38 17   Row Name 01/11/16 1647 01/16/16 1500 01/18/16 1500 01/23/16 1500 01/25/16 1500     Response to Exercise   Blood Pressure (Admit) 100/56 100/64 105/71 96/60 112/64   Blood Pressure (Exercise) 110/70 110/74 104/60 96/68 116/80   Blood Pressure (Exit) 100/60 100/60 100/60 92/60 108/60   Heart Rate (Admit) 106 bpm 101 bpm 93 bpm 102 bpm 100 bpm   Heart Rate (Exercise)  116 bpm 100 bpm 96 bpm 107 bpm 101 bpm   Heart Rate (Exit) 100 bpm 97 bpm 99 bpm 108 bpm 93 bpm   Oxygen Saturation (Admit) 95 % 92 % 94 % 93 % 93 %   Oxygen Saturation (Exercise) 94 % 95 % 95 % 92 % 93 %   Oxygen Saturation (Exit) 95 % 95 % 96 % 95 % 97 %   Rating of Perceived Exertion (Exercise) '13 13 11 13 11   '$ Perceived Dyspnea (Exercise) 1 1 0 0 0   Duration Progress to 45 minutes of aerobic exercise without signs/symptoms of physical distress Progress to 45 minutes of aerobic exercise without signs/symptoms of physical distress Progress to 45 minutes of aerobic exercise without signs/symptoms of physical distress Progress to 45 minutes of aerobic exercise without signs/symptoms of physical distress Progress to 45 minutes of aerobic exercise without signs/symptoms of physical distress   Intensity THRR unchanged THRR unchanged THRR unchanged THRR unchanged THRR unchanged     Progression   Progression Continue to progress workloads to maintain intensity without signs/symptoms of physical distress. Continue to progress workloads to maintain intensity without signs/symptoms of physical distress. Continue to progress workloads to maintain intensity without signs/symptoms of physical distress. Continue to progress workloads to maintain intensity without signs/symptoms of physical distress. Continue to progress workloads to maintain intensity without signs/symptoms of physical distress.     Resistance Training   Training Prescription Yes Yes Yes Yes Yes   Weight blue bands blue bands blue bands blue bands blue bands   Reps 10-12  10 minutes of strength training 10-12  10 minutes of strength training 10-12  10 minutes of strength training 10-12  10 minutes of strength training 10-12  10 minutes of strength training     Interval Training   Interval Training No  - No No No     Recumbant Bike   Level '3 3 3 3  '$ -   Minutes '17 17 17 17  '$ -     NuStep   Level  - '4 4 4 4   '$ Minutes  - '17 17 17 17    '$ METs  - 1.3 1.7 2.1 1.6     Track   Laps 9 9  - 9 9   Minutes 17 17  - 17 17   Row Name 01/30/16 1500             Response to Exercise   Blood Pressure (Admit) 100/60       Blood Pressure (Exercise) 104/60       Blood Pressure (Exit) 100/70       Heart Rate (Admit) 96 bpm  Heart Rate (Exercise) 93 bpm       Heart Rate (Exit) 91 bpm       Oxygen Saturation (Admit) 95 %       Oxygen Saturation (Exercise) 93 %       Oxygen Saturation (Exit) 95 %       Rating of Perceived Exertion (Exercise) 13       Perceived Dyspnea (Exercise) 0       Duration Progress to 45 minutes of aerobic exercise without signs/symptoms of physical distress       Intensity THRR unchanged         Progression   Progression Continue to progress workloads to maintain intensity without signs/symptoms of physical distress.         Resistance Training   Training Prescription Yes       Weight blue bands       Reps 10-12  10 minutes of strength training         Interval Training   Interval Training No         Recumbant Bike   Level 3       Minutes 17         NuStep   Level 4       Minutes 17       METs 1.6         Track   Laps 8       Minutes 17          Exercise Comments:     Exercise Comments    Row Name 11/30/15 1525 12/07/15 0847 01/04/16 0853 02/01/16 0817     Exercise Comments Reviewed home exercise with patient. Patient will exercise at home 2-3 days a week for 30 minutes walk/bike.  Patient is limited in workload intensity due to orthopedic ailments. Still attempts to progress intensity. Will exercise at home with wife.  Exercising at home, has an appointment with orthopedic physician today to address shouler pain.  Still attempting to progress workloads Patient has chronic pain and has a hard time increasing workloads. Will continue to monitor and attempt to increase workloads.       Discharge Exercise Prescription (Final Exercise Prescription Changes):     Exercise  Prescription Changes - 01/30/16 1500      Response to Exercise   Blood Pressure (Admit) 100/60   Blood Pressure (Exercise) 104/60   Blood Pressure (Exit) 100/70   Heart Rate (Admit) 96 bpm   Heart Rate (Exercise) 93 bpm   Heart Rate (Exit) 91 bpm   Oxygen Saturation (Admit) 95 %   Oxygen Saturation (Exercise) 93 %   Oxygen Saturation (Exit) 95 %   Rating of Perceived Exertion (Exercise) 13   Perceived Dyspnea (Exercise) 0   Duration Progress to 45 minutes of aerobic exercise without signs/symptoms of physical distress   Intensity THRR unchanged     Progression   Progression Continue to progress workloads to maintain intensity without signs/symptoms of physical distress.     Resistance Training   Training Prescription Yes   Weight blue bands   Reps 10-12  10 minutes of strength training     Interval Training   Interval Training No     Recumbant Bike   Level 3   Minutes 17     NuStep   Level 4   Minutes 17   METs 1.6     Track   Laps 8   Minutes 17       Nutrition:  Target Goals: Understanding of nutrition guidelines, daily intake of sodium '1500mg'$ , cholesterol '200mg'$ , calories 30% from fat and 7% or less from saturated fats, daily to have 5 or more servings of fruits and vegetables.  Biometrics:     Pre Biometrics - 10/30/15 0947      Pre Biometrics   Grip Strength 28 kg       Nutrition Therapy Plan and Nutrition Goals:     Nutrition Therapy & Goals - 11/30/15 1538      Nutrition Therapy   Diet General, Healthful      Personal Nutrition Goals   Personal Goal #1 0.5-2 lb wt loss per week to a goal wt of 6-24 lb at graduation from [facility], educate and counsel regarding individualized specific dietary modifications aiming towards targeted core components such as weight, hypertension, lipid management, diabetes, heart failure and other comorbidities.   Expected Outcomes Short Term Goal: Understand  basic principles of dietary content, such as calories, fat, sodium, cholesterol and nutrients.;Long Term Goal: Adherence to prescribed nutrition plan.      Nutrition Discharge: Rate Your Plate Scores:     Nutrition Assessments - 11/30/15 1539      MEDFICTS Scores   Pre Score 51      Psychosocial: Target Goals: Acknowledge presence or absence of depression, maximize coping skills, provide positive support system. Participant is able to verbalize types and ability to use techniques and skills needed for reducing stress and depression.  Initial Review & Psychosocial Screening:     Initial Psych Review & Screening - 10/30/15 0953      Initial Review   Current issues with History of Depression;Current Stress Concerns   Source of Stress Concerns Chronic Illness;Unable to perform yard/household activities     [place]? Yes     Barriers   Psychosocial barriers to participate in program Psychosocial barriers identified (see note)     Screening Interventions   Interventions Encouraged to exercise      Quality of Life Scores:   PHQ-9: Recent Review Flowsheet Data    Depression screen [facility] 2/9 10/30/2015 06/12/2015 03/13/2015 12/23/2014   Decreased Interest 0 0 0 0   Down, Depressed, Hopeless 0 '3 1 1   '$ PHQ - 2 Score 0 '3 1 1   '$ Altered sleeping - 3 - -   Tired, decreased energy - 3 - -   Change in appetite - 0 - -   Feeling bad or failure about yourself  - 0 - -   Trouble concentrating - 0 - -   Moving slowly or fidgety/restless - 0 - -   Suicidal thoughts - 0 - -   PHQ-9 Score - 9 - -   Difficult doing work/chores - Somewhat difficult - -      Psychosocial Evaluation and Intervention:     Psychosocial Evaluation - 10/30/15 0954      Psychosocial Evaluation & Interventions   Interventions Encouraged to exercise with the program and follow exercise prescription      Psychosocial Re-Evaluation:     Psychosocial Re-Evaluation    Row Name  11/28/15 1638 01/01/16 1208 01/30/16 0831         Psychosocial Re-Evaluation   Interventions Encouraged to attend Pulmonary Rehabilitation for the exercise Encouraged to attend Pulmonary Rehabilitation for the exercise Encouraged to attend Pulmonary Rehabilitation for the exercise     Comments Mental outlook is more positive than  in the past.  Exercise seems to have improved mental outlook. No psychosocial concerns identified at this time. -  no psychosocial concerns identified     Continued Psychosocial Services Needed Yes No No       Education: Education Goals: Education classes will be provided on a weekly basis, covering required topics. Participant will state understanding/return demonstration of topics presented.  Learning Barriers/Preferences:     Learning Barriers/Preferences - 10/30/15 0946      Learning Barriers/Preferences   Learning Barriers None   Learning Preferences Group Instruction;Individual Instruction;Verbal Instruction;Skilled Demonstration;Written Material      Education Topics: Risk Factor Reduction:  -Group instruction that is supported by a PowerPoint presentation. Instructor discusses the definition of a risk factor, different risk factors for pulmonary disease, and how the heart and lungs work together.     Nutrition for Pulmonary Patient:  -Group instruction provided by PowerPoint slides, verbal discussion, and written materials to support subject matter. The instructor gives an explanation and review of healthy diet recommendations, which includes a discussion on weight management, recommendations for fruit and vegetable consumption, as well as protein, fluid, caffeine, fiber, sodium, sugar, and alcohol. Tips for eating when patients are short of breath are discussed. Flowsheet Row PULMONARY REHAB OTHER RESPIRATORY from 01/18/2016 in [place]  Date  11/23/15  Educator  RD  Instruction Review Code  2- meets goals/outcomes       Pursed Lip Breathing:  -Group instruction that is supported by demonstration and informational handouts. Instructor discusses the benefits of pursed lip and diaphragmatic breathing and detailed demonstration on how to preform both.   Flowsheet Row PULMONARY REHAB OTHER RESPIRATORY from 01/18/2016 in [place]  Date  01/18/16  Educator  EP  Instruction Review Code  R- Review/reinforce      Oxygen Safety:  -Group instruction provided by PowerPoint, verbal discussion, and written material to support subject matter. There is an overview of "What is Oxygen" and "Why do we need it".  Instructor also reviews how to create a safe environment for oxygen use, the importance of using oxygen as prescribed, and the risks of noncompliance. There is a brief discussion on traveling with oxygen and resources the patient may utilize. Flowsheet Row PULMONARY REHAB OTHER RESPIRATORY from 01/18/2016 in [place]  Date  12/07/15  Educator  RN  Instruction Review Code  2- meets goals/outcomes      Oxygen Equipment:  -Group instruction provided by [facility] Staff utilizing handouts, written materials, and equipment demonstrations.   Signs and Symptoms:  -Group instruction provided by written material and verbal discussion to support subject matter. Warning signs and symptoms of infection, stroke, and heart attack are reviewed and when to call the physician/911 reinforced. Tips for preventing the spread of infection discussed.   Advanced Directives:  -Group instruction provided by verbal instruction and written material to support subject matter. Instructor reviews Advanced Directive laws and proper instruction for filling out document.   Pulmonary Video:  -Group video education that reviews the importance of medication and oxygen compliance, exercise, good nutrition, pulmonary hygiene, and pursed lip and diaphragmatic breathing for the  pulmonary patient. Flowsheet Row PULMONARY REHAB OTHER RESPIRATORY from 01/18/2016 in [place]  Date  11/30/15  Instruction Review Code  2- meets goals/outcomes      Exercise for the Pulmonary Patient:  -Group instruction that is supported by a PowerPoint presentation. Instructor discusses benefits of  exercise, core components of exercise, frequency, duration, and intensity of an exercise routine, importance of utilizing pulse oximetry during exercise, safety while exercising, and options of places to exercise outside of rehab.   Flowsheet Row PULMONARY REHAB OTHER RESPIRATORY from 01/18/2016 in [place]  Date  12/28/15  Educator  EP  Instruction Review Code  2- meets goals/outcomes      Pulmonary Medications:  -Verbally interactive group education provided by instructor with focus on inhaled medications and proper administration.   Anatomy and Physiology of the Respiratory System and Intimacy:  -Group instruction provided by PowerPoint, verbal discussion, and written material to support subject matter. Instructor reviews respiratory cycle and anatomical components of the respiratory system and their functions. Instructor also reviews differences in obstructive and restrictive respiratory diseases with examples of each. Intimacy, Sex, and Sexuality differences are reviewed with a discussion on how relationships can change when diagnosed with pulmonary disease. Common sexual concerns are reviewed. Flowsheet Row PULMONARY REHAB OTHER RESPIRATORY from 01/18/2016 in [place]  Date  01/11/16  Educator  RN  Instruction Review Code  2- meets goals/outcomes      Knowledge Questionnaire Score:   Core Components/Risk Factors/Patient Goals at Admission:     Personal Goals and Risk Factors at Admission - 10/30/15 0949      Core Components/Risk Factors/Patient Goals on Admission    Weight  Management Obesity;Yes   Intervention Weight Management: Develop a combined nutrition and exercise program designed to reach desired caloric intake, while maintaining appropriate intake of nutrient and fiber, sodium and fats, and appropriate energy expenditure required for the weight goal.;Weight Management: Provide education and appropriate resources to help participant work on and attain dietary goals.;Weight Management/Obesity: Establish reasonable short term and long term weight goals.;Obesity: Provide education and appropriate resources to help participant work on and attain dietary goals.   Admit Weight 264 lb 1.8 oz (119.8 kg)   Expected Outcomes Short Term: Continue to assess and modify interventions until short term weight is achieved;Long Term: Adherence to nutrition and physical activity/exercise program aimed toward attainment of established weight goal   Sedentary Yes   Intervention Provide advice, education, support and counseling about physical activity/exercise needs.;Develop an individualized exercise prescription for aerobic and resistive training based on initial evaluation findings, risk stratification, comorbidities and participant's personal goals.   Expected Outcomes Achievement of increased cardiorespiratory fitness and enhanced flexibility, muscular endurance and strength shown through measurements of functional capacity and personal statement of participant.   Increase Strength and Stamina Yes   Intervention Provide advice, education, support and counseling about physical activity/exercise needs.;Develop an individualized exercise prescription for aerobic and resistive training based on initial evaluation findings, risk stratification, comorbidities and participant's personal goals.   Expected Outcomes Achievement of increased cardiorespiratory fitness and enhanced flexibility, muscular endurance and strength shown through measurements of functional capacity and personal statement  of participant.   Improve shortness of breath with ADL's Yes   Intervention Provide education, individualized exercise plan and daily activity instruction to help decrease symptoms of SOB with activities of daily living.   Expected Outcomes Short Term: Achieves a reduction of symptoms when performing activities of daily living.   Develop more efficient breathing techniques such as purse lipped breathing and diaphragmatic breathing; and practicing self-pacing with activity Yes   Intervention Provide education, demonstration and support about specific breathing techniuqes utilized for more efficient breathing. Include techniques such as pursed lipped breathing, diaphragmatic breathing and self-pacing activity.  Expected Outcomes Short Term: Participant will be able to demonstrate and use breathing techniques as needed throughout daily activities.   Increase knowledge of respiratory medications and ability to use respiratory devices properly  Yes   Intervention Provide education and demonstration as needed of appropriate use of medications, inhalers, and oxygen therapy.   Expected Outcomes Short Term: Achieves understanding of medications use. Understands that oxygen is a medication prescribed by physician. Demonstrates appropriate use of inhaler and oxygen therapy.   Heart Failure Yes   Intervention Provide a combined exercise and nutrition program that is supplemented with education, support and counseling about heart failure. Directed toward relieving symptoms such as shortness of breath, decreased exercise tolerance, and extremity edema.   Expected Outcomes Improve functional capacity of life;Short term: Attendance in program 2-3 days a week with increased exercise capacity. Reported lower sodium intake. Reported increased fruit and vegetable intake. Reports medication compliance.;Short term: Daily weights obtained and reported for increase. Utilizing diuretic protocols set by physician.;Long term:  Adoption of self-care skills and reduction of barriers for early signs and symptoms recognition and intervention leading to self-care maintenance.   Personal Goal Other Yes   Personal Goal to become more mobile with less shortness of breath   Intervention increase work loads on equiptment as tolerated inorder to increase stamina and strength   Expected Outcomes patient will report he is able to do more physical activities with less shortness of breath      Core Components/Risk Factors/Patient Goals Review:      Goals and Risk Factor Review    Row Name 11/28/15 1633 01/01/16 1203 01/30/16 0829         Core Components/Risk Factors/Patient Goals Review   Personal Goals Review Increase Strength and Stamina;Improve shortness of breath with ADL's Increase Strength and Stamina;Improve shortness of breath with ADL's  -     Review Just started program, this is the 1st 30 days in program, no great change in strength and stamina yet Definite improvement in strength and stamina, half-way through program, improvement in mental attitude with exercise left shoulder and arm are being evaluated for a nerve conduction problem, that has limited him from progressing due to constant pain     Expected Outcomes Should see a greater improvement in strength and stamina in the next 30 days with increasing workloads as  Continue to increase workloads as tolerated the next 30 days to continue to see improvements. increase workloads as tolerated, has 5 exercise sessions left.        Core Components/Risk Factors/Patient Goals at Discharge (Final Review):      Goals and Risk Factor Review - 01/30/16 0829      Core Components/Risk Factors/Patient Goals Review   Review left shoulder and arm are being evaluated for a nerve conduction problem, that has limited him from progressing due to constant pain   Expected Outcomes increase workloads as tolerated, has 5 exercise sessions left.      ITP Comments:   Comments:  ITP REVIEW Pt is making expected progress toward personal goals after completing 20 sessions.   Recommend continued exercise, life style modification, education, and utilization of breathing techniques to increase stamina and strength and decrease shortness of breath with exertion.

## 2016-02-06 ENCOUNTER — Encounter (HOSPITAL_COMMUNITY)
Admission: RE | Admit: 2016-02-06 | Discharge: 2016-02-06 | Disposition: A | Discharge status: Home / Self Care | Payer: Medicare Other | Source: Ambulatory Visit | Attending: Pulmonary Disease | Admitting: Pulmonary Disease

## 2016-02-06 VITALS — Wt 262.1 lb

## 2016-02-06 DIAGNOSIS — J9611 Chronic respiratory failure with hypoxia: Secondary | ICD-10-CM

## 2016-02-06 DIAGNOSIS — J449 Chronic obstructive pulmonary disease, unspecified: Secondary | ICD-10-CM | POA: Diagnosis not present

## 2016-02-06 DIAGNOSIS — D869 Sarcoidosis, unspecified: Secondary | ICD-10-CM

## 2016-02-06 NOTE — Progress Notes (Signed)
Daily Session Note  Patient Details  Name: NAIF ALABI MRN: [id number] Date of Birth: 1941/01/24 Referring Provider:   April Manson Pulmonary Rehab Walk Test from 11/02/2015 in [place]  Referring Provider  Dr. Halford Chessman      Encounter Date: 02/06/2016  Check In:     Session Check In - 02/06/16 1345      Check-In   Location [facility]   Staff Present Rosebud Poles, RN, BSN;Lisa Ysidro Evert, RN;Molly diVincenzo, MS, ACSM RCEP, Exercise Physiologist;Annedrea Rosezella [place], RN, MHA;Portia Rollene Rotunda, RN, BSN   Supervising physician immediately available to respond to emergencies [facility] immediately available   Physician(s) Dr. Marily Memos   Medication changes reported     No   Fall or balance concerns reported    No   Warm-up and Cool-down Performed as group-led instruction   Resistance Training Performed Yes   VAD Patient? No     Pain Assessment   Currently in Pain? No/denies   Multiple Pain Sites No      Capillary Blood Glucose: No results found for this or any previous visit (from the past 24 hour(s)).      Exercise Prescription Changes - 02/06/16 1500      Response to Exercise   Blood Pressure (Admit) 104/66   Blood Pressure (Exercise) 100/60   Blood Pressure (Exit) 110/60   Heart Rate (Admit) 85 bpm   Heart Rate (Exercise) 98 bpm   Heart Rate (Exit) 95 bpm   Oxygen Saturation (Admit) 95 %   Oxygen Saturation (Exercise) 93 %   Oxygen Saturation (Exit) 97 %   Rating of Perceived Exertion (Exercise) 13   Perceived Dyspnea (Exercise) 2   Duration Progress to 45 minutes of aerobic exercise without signs/symptoms of physical distress   Intensity THRR unchanged     Progression   Progression Continue to progress workloads to maintain intensity without signs/symptoms of physical distress.     Resistance Training   Training Prescription Yes   Weight blue bands   Reps 10-12  10 minutes of strength training     Interval  Training   Interval Training No     Recumbant Bike   Level 3   Minutes 17     NuStep   Level 4   Minutes 17     Track   Laps 9   Minutes 17     Goals Met:  Exercise tolerated well Strength training completed today  Goals Unmet:  Not Applicable  Comments: Service time is from 1330 to 1510    Dr. Rush [place] is Medical Director for Pulmonary Rehab at [facility].

## 2016-02-08 ENCOUNTER — Encounter (HOSPITAL_COMMUNITY)
Admission: RE | Admit: 2016-02-08 | Discharge: 2016-02-08 | Disposition: A | Discharge status: Home / Self Care | Payer: Medicare Other | Source: Ambulatory Visit | Attending: Pulmonary Disease | Admitting: Pulmonary Disease

## 2016-02-08 VITALS — Wt 261.2 lb

## 2016-02-08 DIAGNOSIS — D869 Sarcoidosis, unspecified: Secondary | ICD-10-CM

## 2016-02-08 DIAGNOSIS — J9611 Chronic respiratory failure with hypoxia: Secondary | ICD-10-CM

## 2016-02-08 DIAGNOSIS — J449 Chronic obstructive pulmonary disease, unspecified: Secondary | ICD-10-CM | POA: Diagnosis not present

## 2016-02-08 NOTE — Progress Notes (Signed)
Daily Session Note  Patient Details  Name: Parvin L Fatula MRN: [id number] Date of Birth: 03/08/1941 Referring Provider:   Flowsheet Row Pulmonary Rehab Walk Test from 11/02/2015 in [facility]  Referring Provider  Dr. Sood      Encounter Date: 02/08/2016  Check In:     Session Check In - 02/08/16 1240      Check-In   Location [facility]   Staff Present Joan Behrens, RN, BSN;Molly diVincenzo, MS, ACSM RCEP, Exercise Physiologist; , RN;Portia Payne, RN, BSN   Supervising physician immediately available to respond to emergencies [facility] immediately available   Physician(s) Dr. Ogbata   Medication changes reported     No   Fall or balance concerns reported    No   Warm-up and Cool-down Performed as group-led instruction   Resistance Training Performed Yes   VAD Patient? No     Pain Assessment   Currently in Pain? No/denies   Multiple Pain Sites No      Capillary Blood Glucose: No results found for this or any previous visit (from the past 24 hour(s)).      Exercise Prescription Changes - 02/08/16 1500      Response to Exercise   Blood Pressure (Admit) 104/66   Blood Pressure (Exercise) 100/66   Blood Pressure (Exit) 110/60   Heart Rate (Admit) 96 bpm   Heart Rate (Exercise) 91 bpm   Heart Rate (Exit) 91 bpm   Oxygen Saturation (Admit) 98 %   Oxygen Saturation (Exercise) 93 %   Oxygen Saturation (Exit) 97 %   Rating of Perceived Exertion (Exercise) 13   Perceived Dyspnea (Exercise) 0   Duration Progress to 45 minutes of aerobic exercise without signs/symptoms of physical distress   Intensity THRR unchanged     Progression   Progression Continue to progress workloads to maintain intensity without signs/symptoms of physical distress.     Resistance Training   Training Prescription Yes   Weight blue bands   Reps 10-12  10 minutes of strength training     Interval Training   Interval Training  No     NuStep   Level 4   Minutes 17   METs 1.6     Track   Laps 9   Minutes 17     Goals Met:  Exercise tolerated well No report of cardiac concerns or symptoms Strength training completed today  Goals Unmet:  Not Applicable  Comments: Service time is from 1215 to 1445 Attended question and answer class with Dr. Yacoub     Dr. Wesam G. Yacoub is Medical Director for Pulmonary Rehab at [facility]. 

## 2016-02-13 ENCOUNTER — Encounter (HOSPITAL_COMMUNITY)
Admission: RE | Admit: 2016-02-13 | Discharge: 2016-02-13 | Disposition: A | Discharge status: Home / Self Care | Payer: Medicare Other | Source: Ambulatory Visit | Attending: Pulmonary Disease | Admitting: Pulmonary Disease

## 2016-02-13 DIAGNOSIS — J449 Chronic obstructive pulmonary disease, unspecified: Secondary | ICD-10-CM | POA: Diagnosis not present

## 2016-02-13 DIAGNOSIS — J9611 Chronic respiratory failure with hypoxia: Secondary | ICD-10-CM

## 2016-02-13 DIAGNOSIS — D869 Sarcoidosis, unspecified: Secondary | ICD-10-CM

## 2016-02-15 ENCOUNTER — Encounter (HOSPITAL_COMMUNITY): Admission: RE | Admit: 2016-02-15 | Payer: Medicare Other | Source: Ambulatory Visit

## 2016-02-20 ENCOUNTER — Encounter (HOSPITAL_COMMUNITY): Payer: Medicare Other

## 2016-02-22 ENCOUNTER — Encounter (HOSPITAL_COMMUNITY): Payer: Medicare Other

## 2016-02-27 ENCOUNTER — Encounter (HOSPITAL_COMMUNITY): Payer: Medicare Other

## 2016-02-29 ENCOUNTER — Encounter (HOSPITAL_COMMUNITY): Payer: Medicare Other

## 2016-03-11 NOTE — Progress Notes (Signed)
Discharge Summary  Patient Details  Name: Travis Ferguson MRN: [id number] Date of Birth: 08-01-1940 Referring Provider:   [facility] Walk Test from 11/02/2015 in [place]  Referring Provider  Dr. Halford Chessman       Number of Visits: 22   Reason for Discharge:  Patient independent in their exercise.  Smoking History:  History  Smoking Status  . Former Smoker  . Packs/day: 0.00  . Years: 30.00  . Types: Pipe  . Quit date: 06/24/2008  Smokeless Tobacco  . Never Used    Diagnosis:  Chronic respiratory failure with hypoxia ([place])  Sarcoidosis ([place])  ADL [facility]:     Pulmonary Assessment Scores    Row Name 02/08/16 1457         ADL [facility]   ADL Phase Exit     SOB Score total 17        Initial Exercise Prescription:     Initial Exercise Prescription - 11/02/15 1600      Date of Initial Exercise RX and Referring Provider   Date 11/02/15   Referring Provider Dr. Halford Chessman     Oxygen   Oxygen --  room air     Bike   Level 0.5   Minutes 15     NuStep   Level 2   Minutes 15   METs 1.5     Track   Laps 5   Minutes 15     Prescription Details   Frequency (times per week) 2   Duration Progress to 45 minutes of aerobic exercise without signs/symptoms of physical distress     Intensity   THRR 40-80% of Max Heartrate 58-116   Ratings of Perceived Exertion 11-13   Perceived Dyspnea 0-4     Progression   Progression Continue progressive overload as per policy without signs/symptoms or physical distress.     Resistance Training   Training Prescription Yes   Weight blue bands   Reps 10-12      Discharge Exercise Prescription (Final Exercise Prescription Changes):     Exercise Prescription Changes - 02/08/16 1500      Response to Exercise   Blood Pressure (Admit) 104/66   Blood Pressure (Exercise) 100/66   Blood Pressure (Exit) 110/60   Heart Rate (Admit) 96 bpm   Heart Rate (Exercise) 91 bpm   Heart Rate  (Exit) 91 bpm   Oxygen Saturation (Admit) 98 %   Oxygen Saturation (Exercise) 93 %   Oxygen Saturation (Exit) 97 %   Rating of Perceived Exertion (Exercise) 13   Perceived Dyspnea (Exercise) 0   Duration Progress to 45 minutes of aerobic exercise without signs/symptoms of physical distress   Intensity THRR unchanged     Progression   Progression Continue to progress workloads to maintain intensity without signs/symptoms of physical distress.     Resistance Training   Training Prescription Yes   Weight blue bands   Reps 10-12  10 minutes of strength training     Interval Training   Interval Training No     NuStep   Level 4   Minutes 17   METs 1.6     Track   Laps 9   Minutes 17      Functional Capacity:     [place] Name 11/02/15 1631 02/13/16 1520       6 Minute Walk   Phase Initial Discharge    Distance 1050 feet 900 feet  Walk Time 6 minutes 6 minutes    # of Rest Breaks  - 0    MPH 1.98 1.7    METS  - 2.3    RPE 13 11    Perceived Dyspnea   - 1    VO2 Peak 7.14  -    Symptoms Yes (comment) No    Comments knee pain  -    Resting HR 90 bpm 98 bpm    Resting BP 120/60 106/73    Max Ex. HR 123 bpm 113 bpm    Max Ex. BP 140/80 120/80      Interval HR   Baseline HR  - 98    1 Minute HR  - 111    2 Minute HR  - 111    3 Minute HR 123 113    4 Minute HR  - 113    5 Minute HR  - 112    6 Minute HR  - 110    2 Minute Post HR  - 96    Interval Heart Rate? Yes Yes      Interval Oxygen   Interval Oxygen? Yes Yes    Baseline Oxygen Saturation %  - 95 %    Baseline Liters of Oxygen 0 L 0 L    1 Minute Oxygen Saturation %  - 92 %    1 Minute Liters of Oxygen 0 L 0 L    2 Minute Oxygen Saturation %  - 90 %    2 Minute Liters of Oxygen 0 L 0 L    3 Minute Oxygen Saturation %  - 91 %    3 Minute Liters of Oxygen 0 L 0 L    4 Minute Oxygen Saturation % 89 % 91 %    4 Minute Liters of Oxygen 0 L 0 L    5 Minute Oxygen Saturation %  - 90 %     5 Minute Liters of Oxygen 0 L 0 L    6 Minute Oxygen Saturation % 90 % 93 %    6 Minute Liters of Oxygen 0 L 0 L    2 Minute Post Oxygen Saturation %  - 97 %    2 Minute Post Liters of Oxygen 0 L 0 L       Psychological, QOL, Others - Outcomes: PHQ 2/9: Depression screen [facility] 2/9 10/30/2015 06/12/2015 03/13/2015 12/23/2014  Decreased Interest 0 0 0 0  Down, Depressed, Hopeless 0 '3 1 1  '$ PHQ - 2 Score 0 '3 1 1  '$ Altered sleeping - 3 - -  Tired, decreased energy - 3 - -  Change in appetite - 0 - -  Feeling bad or failure about yourself  - 0 - -  Trouble concentrating - 0 - -  Moving slowly or fidgety/restless - 0 - -  Suicidal thoughts - 0 - -  PHQ-9 Score - 9 - -  Difficult doing work/chores - Somewhat difficult - -  Some recent data might be hidden    Quality of Life:     Quality of Life - 02/08/16 1457      Quality of Life Scores   Health/Function Post 16.8 %   Socioeconomic Post 23.43 %   Psych/Spiritual Post 23.14 %   Family Post 16.38 %   GLOBAL Post 19.5 %      Personal Goals: Goals established at orientation with interventions provided to work toward goal.     Personal Goals and Risk Factors  at Admission - 10/30/15 0949      Core Components/Risk Factors/Patient Goals on Admission    Weight Management Obesity;Yes   Intervention Weight Management: Develop a combined nutrition and exercise program designed to reach desired caloric intake, while maintaining appropriate intake of nutrient and fiber, sodium and fats, and appropriate energy expenditure required for the weight goal.;Weight Management: Provide education and appropriate resources to help participant work on and attain dietary goals.;Weight Management/Obesity: Establish reasonable short term and long term weight goals.;Obesity: Provide education and appropriate resources to help participant work on and attain dietary goals.   Admit Weight 264 lb 1.8 oz (119.8 kg)   Expected Outcomes Short Term: Continue to assess  and modify interventions until short term weight is achieved;Long Term: Adherence to nutrition and physical activity/exercise program aimed toward attainment of established weight goal   Sedentary Yes   Intervention Provide advice, education, support and counseling about physical activity/exercise needs.;Develop an individualized exercise prescription for aerobic and resistive training based on initial evaluation findings, risk stratification, comorbidities and participant's personal goals.   Expected Outcomes Achievement of increased cardiorespiratory fitness and enhanced flexibility, muscular endurance and strength shown through measurements of functional capacity and personal statement of participant.   Increase Strength and Stamina Yes   Intervention Provide advice, education, support and counseling about physical activity/exercise needs.;Develop an individualized exercise prescription for aerobic and resistive training based on initial evaluation findings, risk stratification, comorbidities and participant's personal goals.   Expected Outcomes Achievement of increased cardiorespiratory fitness and enhanced flexibility, muscular endurance and strength shown through measurements of functional capacity and personal statement of participant.   Improve shortness of breath with ADL's Yes   Intervention Provide education, individualized exercise plan and daily activity instruction to help decrease symptoms of SOB with activities of daily living.   Expected Outcomes Short Term: Achieves a reduction of symptoms when performing activities of daily living.   Develop more efficient breathing techniques such as purse lipped breathing and diaphragmatic breathing; and practicing self-pacing with activity Yes   Intervention Provide education, demonstration and support about specific breathing techniuqes utilized for more efficient breathing. Include techniques such as pursed lipped breathing, diaphragmatic breathing  and self-pacing activity.   Expected Outcomes Short Term: Participant will be able to demonstrate and use breathing techniques as needed throughout daily activities.   Increase knowledge of respiratory medications and ability to use respiratory devices properly  Yes   Intervention Provide education and demonstration as needed of appropriate use of medications, inhalers, and oxygen therapy.   Expected Outcomes Short Term: Achieves understanding of medications use. Understands that oxygen is a medication prescribed by physician. Demonstrates appropriate use of inhaler and oxygen therapy.   Heart Failure Yes   Intervention Provide a combined exercise and nutrition program that is supplemented with education, support and counseling about heart failure. Directed toward relieving symptoms such as shortness of breath, decreased exercise tolerance, and extremity edema.   Expected Outcomes Improve functional capacity of life;Short term: Attendance in program 2-3 days a week with increased exercise capacity. Reported lower sodium intake. Reported increased fruit and vegetable intake. Reports medication compliance.;Short term: Daily weights obtained and reported for increase. Utilizing diuretic protocols set by physician.;Long term: Adoption of self-care skills and reduction of barriers for early signs and symptoms recognition and intervention leading to self-care maintenance.   Personal Goal Other Yes   Personal Goal to become more mobile with less shortness of breath   Intervention increase work loads on equiptment as tolerated inorder to  increase stamina and strength   Expected Outcomes patient will report he is able to do more physical activities with less shortness of breath       Personal Goals Discharge:     Goals and Risk Factor Review    Row Name 11/28/15 1633 01/01/16 1203 01/30/16 0829         Core Components/Risk Factors/Patient Goals Review   Personal Goals Review Increase Strength and  Stamina;Improve shortness of breath with ADL's Increase Strength and Stamina;Improve shortness of breath with ADL's  -     Review Just started program, this is the 1st 30 days in program, no great change in strength and stamina yet Definite improvement in strength and stamina, half-way through program, improvement in mental attitude with exercise left shoulder and arm are being evaluated for a nerve conduction problem, that has limited him from progressing due to constant pain     Expected Outcomes Should see a greater improvement in strength and stamina in the next 30 days with increasing workloads as  Continue to increase workloads as tolerated the next 30 days to continue to see improvements. increase workloads as tolerated, has 5 exercise sessions left.        Nutrition & Weight - Outcomes:     Pre Biometrics - 10/30/15 0947      Pre Biometrics   Grip Strength 28 kg       Nutrition:     Nutrition Therapy & Goals - 11/30/15 1538      Nutrition Therapy   Diet General, Healthful      Personal Nutrition Goals   Personal Goal #1 0.5-2 lb wt loss per week to a goal wt of 6-24 lb at graduation from [place], educate and counsel regarding individualized specific dietary modifications aiming towards targeted core components such as weight, hypertension, lipid management, diabetes, heart failure and other comorbidities.   Expected Outcomes Short Term Goal: Understand basic principles of dietary content, such as calories, fat, sodium, cholesterol and nutrients.;Long Term Goal: Adherence to prescribed nutrition plan.      Nutrition Discharge:     Nutrition Assessments - 02/22/16 1040      Rate Your Plate Scores   Pre Score 51   Post Score 61      Education Questionnaire Score:     Knowledge Questionnaire Score - 02/08/16 1456      Knowledge Questionnaire Score   Pre Score 10/13   Post Score 12/13      Goals reviewed  with patient; copy given to patient.

## 2016-03-11 NOTE — Addendum Note (Signed)
Encounter addended by: Lance Morin, RN on: 03/11/2016  9:51 AM<BR>    Actions taken: Sign clinical note

## 2016-03-11 NOTE — Addendum Note (Signed)
Encounter addended by: Lance Morin, RN on: 03/11/2016  9:56 AM<BR>    Actions taken: Episode resolved

## 2016-04-17 ENCOUNTER — Ambulatory Visit (INDEPENDENT_AMBULATORY_CARE_PROVIDER_SITE_OTHER): Payer: Medicare Other | Admitting: Pulmonary Disease

## 2016-04-17 ENCOUNTER — Encounter: Payer: Self-pay | Admitting: Pulmonary Disease

## 2016-04-17 VITALS — BP 118/78 | HR 85 | Ht 71.0 in | Wt 263.8 lb

## 2016-04-17 DIAGNOSIS — D860 Sarcoidosis of lung: Secondary | ICD-10-CM | POA: Diagnosis not present

## 2016-04-17 DIAGNOSIS — J449 Chronic obstructive pulmonary disease, unspecified: Secondary | ICD-10-CM | POA: Diagnosis not present

## 2016-04-17 DIAGNOSIS — J9611 Chronic respiratory failure with hypoxia: Secondary | ICD-10-CM | POA: Diagnosis not present

## 2016-04-17 MED ORDER — ARFORMOTEROL TARTRATE 15 MCG/2ML IN NEBU: 15.0000 ug | INHALATION_SOLUTION | Freq: Two times a day (BID) | RESPIRATORY_TRACT | 11 refills | Status: DC

## 2016-04-17 MED ORDER — BUDESONIDE-FORMOTEROL FUMARATE 160-4.5 MCG/ACT IN AERO: 2.0000 | INHALATION_SPRAY | Freq: Two times a day (BID) | RESPIRATORY_TRACT | 0 refills | Status: DC

## 2016-04-17 MED ORDER — BUDESONIDE 0.5 MG/2ML IN SUSP: 0.5000 mg | Freq: Two times a day (BID) | RESPIRATORY_TRACT | 12 refills | Status: DC

## 2016-04-17 NOTE — Addendum Note (Signed)
Addended by: Virl Cagey on: 04/17/2016 05:16 PM   Modules accepted: Orders

## 2016-04-17 NOTE — Patient Instructions (Signed)
Pulmicort and Brovana one vial each nebulized twice per day, and rinse mouth after each use  Follow up in 6 months

## 2016-04-17 NOTE — Progress Notes (Signed)
Current Outpatient Prescriptions on File Prior to Visit  Medication Sig  . albuterol (PROVENTIL) (2.5 MG/3ML) 0.083% nebulizer solution Take 3 mLs (2.5 mg total) by nebulization every 4 (four) hours as needed for wheezing or shortness of breath.  [name] ENTRESTO 24-26 MG TAKE 1 TABLET BY MOUTH 2 (TWO) TIMES DAILY.  . finasteride (PROSCAR) 5 MG tablet Take 5 mg by mouth daily.   [name] FLUoxetine (PROZAC) 40 MG capsule Take 40 mg by mouth daily with breakfast.   . furosemide (LASIX) 20 MG tablet Take 1 tablet (20 mg total) by mouth daily.  . metoprolol tartrate (LOPRESSOR) 25 MG tablet TAKE 1 TABLET (25 MG TOTAL) BY MOUTH 2 (TWO) TIMES DAILY.  . Multiple Vitamin (MULTIVITAMIN) tablet Take 1 tablet by mouth daily.  . OXYGEN Inhale 2 L/min into the lungs at bedtime.  [name] PROAIR HFA 108 (90 BASE) MCG/ACT inhaler 1-2 PUFFS EVERY 4-6 HOURS AS NEEDED (Patient taking differently: 1-2 PUFFS EVERY 4-6 HOURS AS NEEDED for shortness of breath)  . simvastatin (ZOCOR) 20 MG tablet Take 20 mg by mouth every evening.   . sodium chloride (OCEAN) 0.65 % SOLN nasal spray Place 1-2 sprays into both nostrils daily as needed for congestion.  [name] spironolactone (ALDACTONE) 25 MG tablet Take 1 tablet (25 mg total) by mouth daily.   No current facility-administered medications on file prior to visit.     Chief Complaint  Patient presents with  . Follow-up    Pt c/o chest tightness with inhalation x 2-3 weeks. Pt also notes some little cough.    Sleep tests PSG May 2008>> AHI 8   Pulmonary tests Spirometry 11/18/08>>FEV1 2.15 (61%), FEV1% 62  Bronchoscopy 07/28/12 >> focal granulomatous inflammation Spirometry 03/14/14 >> FEV1 2.10 (60%), FEV1% 57  Cardiac tests Echo 08/19/14 >> EF 50 to 55%, grade 1 DD Echo 04/19/15 >> EF 30 to 35% Echo 10/27/15 >> EF 35 to 40%, grade 1 DD  Chest imaging CT chest 05/19/12>>superior segment LLL infiltrate, b/l hilar and mediastinal LAN with calcification CT chest 07/13/12 >>  atherosclerosis, Lt hilar adenopathy, LLL superior segment narrowing, mass like opacity superior segment LLL 5.4 x 1.9 cm, multiple nodular areas LLL, diffuse GGO LLL PET scan 07/22/12 >> mildly hypermetabolic mediastinal adenopathy (SUV 5.8), superior segment nodules hypermetabolic (SUV 35.0) CT chest 08/31/12 >> decreased size of lesions CT chest 12/02/12 >> borderline mediastinal/hilar LAN, nodular septal thickening b/l, no change superior segment LLL, centrilobular emphysema CT chest 03/19/13 >> ?slight increase in LLL consolidative change, borderline LAN CT chest 08/24/13 >> 1.9 cm Rt paratracheal LAN, calcified 2.1 cm subcarinal LAN, narrowing of mainstem bronchi, chronic infiltrate LLL CT chest 03/16/14 >> atherosclerosis, no change in mediastinal and b/l hilar LAN, no change in LLL mass like opacity, thickening of interstitium, scattered sub-pleural nodules, small Lt pleural effusion, gallstones CT chest 08/19/14 >> small b/l effusions, emphysema, no change LLL  Past medical history Depression, PSTD, Migraines, combined CHF, CAD, HTN, HLD, Back pain, SDH 2010, Pseudomonal PNA 2016  Past surgical history, Family history, Social history, Allergies reviewed  Vital signs BP 118/78 (BP Location: Left Arm, Cuff Size: Normal)   Pulse 85   Ht '5\' 11"'$  (1.803 m)   Wt 263 lb 12.8 oz (119.7 kg)   SpO2 96%   BMI 36.79 kg/m   History of Present Illness: Travis Ferguson is a 75 y.o. male former smoker with COPD, and sarcoidosis.  He has been frustrated about his pain and shoulder issues.  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  He is concerned about expense of medicines.  As a result he has been rationing his inhaler use.  He has noticed more wheezing over the past few weeks.  He resumed his symbicort on regular basis, and this has helped.  Physical Exam:  General - No distress ENT - no sinus tenderness, no oral exudate, no LAN Cardiac - s1s2 regular, no  murmur Chest - prolonged exhalation, no wheeze/rales Back - No focal tenderness Abd - Soft, non-tender Ext - No edema Neuro - Normal strength Skin - No rashes Psych - normal mood, and behavior   Assessment/Plan:  COPD with chronic bronchitis. - will change to nebulized pulmicort and brovana to see if this is less expensive >> advised he should run this through medicare part b - prn albuterol  Chronic respiratory failure with hypoxia. - Related to COPD, pulmonary sarcoidosis, presumed OSA - continue 2 liters oxygen at night  Pulmonary sarcoidosis. - Seems stable - monitor clinically  Combined heart failure. - f/u with cardiology   Patient Instructions  Pulmicort and Brovana one vial each nebulized twice per day, and rinse mouth after each use  Follow up in 6 months   Chesley Mires, MD  Pulmonary/Critical Care/Sleep Pager:  [phone] 04/17/2016, 2:41 PM

## 2016-05-03 ENCOUNTER — Ambulatory Visit (HOSPITAL_COMMUNITY): Payer: Medicare Other | Attending: Cardiovascular Disease

## 2016-05-03 ENCOUNTER — Other Ambulatory Visit: Payer: Self-pay

## 2016-05-03 DIAGNOSIS — Z87891 Personal history of nicotine dependence: Secondary | ICD-10-CM | POA: Insufficient documentation

## 2016-05-03 DIAGNOSIS — I10 Essential (primary) hypertension: Secondary | ICD-10-CM | POA: Diagnosis not present

## 2016-05-03 DIAGNOSIS — I110 Hypertensive heart disease with heart failure: Secondary | ICD-10-CM | POA: Insufficient documentation

## 2016-05-03 DIAGNOSIS — I5042 Chronic combined systolic (congestive) and diastolic (congestive) heart failure: Secondary | ICD-10-CM | POA: Diagnosis not present

## 2016-05-03 DIAGNOSIS — J449 Chronic obstructive pulmonary disease, unspecified: Secondary | ICD-10-CM | POA: Insufficient documentation

## 2016-05-10 ENCOUNTER — Telehealth: Payer: Self-pay | Admitting: Cardiovascular Disease

## 2016-05-10 NOTE — Telephone Encounter (Signed)
New Message  Pt call requesting to speak with RN about medication Entresto 24-'26mg'$ . Please call back to discuss

## 2016-05-10 NOTE — Telephone Encounter (Signed)
Spoke with pt, his co-pay for entresto is $499. 90 days after the first of the year he will already be in the donut hole. Based on the recent echo echo that there has been no change, he wonders if there is something else he can take that is comparable to entresto and would be cheaper. Will forward for dr berry's review and advise

## 2016-05-12 NOTE — Telephone Encounter (Signed)
Appointment with Cyril Mourning to discuss. ? ARB  JJB

## 2016-05-13 NOTE — Telephone Encounter (Signed)
Spoke with pt, aware of dr berry's recommendations. Follow up scheduled with the [facility].

## 2016-05-14 ENCOUNTER — Other Ambulatory Visit: Payer: Self-pay | Admitting: Cardiovascular Disease

## 2016-05-30 ENCOUNTER — Ambulatory Visit (INDEPENDENT_AMBULATORY_CARE_PROVIDER_SITE_OTHER): Payer: Medicare Other | Admitting: Pharmacist Clinician (PhC)/ Clinical Pharmacy Specialist

## 2016-05-30 DIAGNOSIS — I5043 Acute on chronic combined systolic (congestive) and diastolic (congestive) heart failure: Secondary | ICD-10-CM

## 2016-05-30 NOTE — Patient Instructions (Signed)
Give [place] a call about 2-3 weeks before you run out of the [place].  [phone] (ask for Erasmo Downer or Raquel)  Your blood pressure today is   Check your blood pressure at home daily and keep record of the readings.  Take your BP meds as follows:  Continue Entresto until gone  Bring all of your meds, your BP cuff and your record of home blood pressures to your next appointment.  Exercise as you're able, try to walk approximately 30 minutes per day.  Keep salt intake to a minimum, especially watch canned and prepared boxed foods.  Eat more fresh fruits and vegetables and fewer canned items.  Avoid eating in fast food restaurants.    HOW TO TAKE YOUR BLOOD PRESSURE: . Rest 5 minutes before taking your blood pressure. .  Don't smoke or drink caffeinated beverages for at least 30 minutes before. . Take your blood pressure before (not after) you eat. . Sit comfortably with your back supported and both feet on the floor (don't cross your legs). . Elevate your arm to heart level on a table or a desk. . Use the proper sized cuff. It should fit smoothly and snugly around your bare upper arm. There should be enough room to slip a fingertip under the cuff. The bottom edge of the cuff should be 1 inch above the crease of the elbow. . Ideally, take 3 measurements at one sitting and record the average.

## 2016-05-30 NOTE — Assessment & Plan Note (Signed)
Patient wanting to get off Entresto due to cost concerns.  He currently has a 3-4 month supply at home.  He will continue with this and is to call our office when he is down to a 2 week supply.   We will then convert him to valsartan 80 mg once daily.  Will have him return to the office about 10-14 days after switching to ensure that he is tolerating valsartan without problems.

## 2016-05-30 NOTE — Progress Notes (Signed)
05/30/2016 Travis Ferguson 05/20/1941 [id number]   HPI:  Travis Ferguson is a 75 y.o. male patient of Dr Gwenlyn Found, with a PMH below who presents today for heart failure medication concerns.  He has been taking Entresto 24/26 since November of 2016, but we were never able to increase his dose beyond the 24/26 due to his stable low blood pressure.  He states he has not noticed any changes since starting this, and he is frustrated by the pricing.  He uses multiple inhalers and nebulizer treatments for his COPD, and with the Entresto and his pain medications, he hits the "donut hole" usually by April each year.  He just paid $500 for a 3 month supply of Entresto and because he doesn't feel as though it helps, would like to get on something cheaper.    Blood Pressure Goal:  130/80  Current Medications:  Entresto 24/26  Metoprolol 25 mg bid  Cardiac Hx:  LV dysfunction with EF 35-45%, hyperlipidemia,   Family Hx:  Father died with CHF at 51, brother with CHF for 8 years  Social Hx:  No tobacco, no alcohol; drinks 2-4+ liters of diet Coke per day as well as 1-2 cups of coffee daily  Diet:  Adds salt to foods occasionally, "just when I feel like it"; wife does not add salt when cooking  Exercise:  None due to pain issues and COPD  Home BP readings:  Did not bring cuff; states most home readings 100-120/60-80.  No readings > 262 systolic  Intolerances:   None  Wt Readings from Last 3 Encounters:  04/17/16 263 lb 12.8 oz (119.7 kg)  02/08/16 261 lb 3.9 oz (118.5 kg)  02/06/16 262 lb 2 oz (118.9 kg)   BP Readings from Last 3 Encounters:  05/30/16 132/78  04/17/16 118/78  10/31/15 104/60   Pulse Readings from Last 3 Encounters:  05/30/16 96  04/17/16 85  10/31/15 90    Current Outpatient Prescriptions  Medication Sig Dispense Refill  . albuterol (PROVENTIL) (2.5 MG/3ML) 0.083% nebulizer solution Take 3 mLs (2.5 mg total) by nebulization every 4 (four) hours as needed for  wheezing or shortness of breath. 75 mL 12  . arformoterol (BROVANA) 15 MCG/2ML NEBU Take 2 mLs (15 mcg total) by nebulization 2 (two) times daily. 120 mL 11  . budesonide-formoterol (SYMBICORT) 160-4.5 MCG/ACT inhaler Inhale 2 puffs into the lungs 2 (two) times daily. 1 Inhaler 0  . ENTRESTO 24-26 MG TAKE 1 TABLET BY MOUTH 2 (TWO) TIMES DAILY. 180 tablet 1  . finasteride (PROSCAR) 5 MG tablet Take 5 mg by mouth daily.     [name] FLUoxetine (PROZAC) 40 MG capsule Take 40 mg by mouth daily with breakfast.     . furosemide (LASIX) 20 MG tablet Take 1 tablet (20 mg total) by mouth daily. 30 tablet 4  . metoprolol tartrate (LOPRESSOR) 25 MG tablet TAKE 1 TABLET (25 MG TOTAL) BY MOUTH 2 (TWO) TIMES DAILY. 90 tablet 1  . Multiple Vitamin (MULTIVITAMIN) tablet Take 1 tablet by mouth daily.    . NUCYNTA 50 MG tablet Take 1 tablet by mouth daily.    . OXYGEN Inhale 2 L/min into the lungs at bedtime.    [name] PROAIR HFA 108 (90 BASE) MCG/ACT inhaler 1-2 PUFFS EVERY 4-6 HOURS AS NEEDED (Patient taking differently: 1-2 PUFFS EVERY 4-6 HOURS AS NEEDED for shortness of breath) 8.5 Inhaler 1  . simvastatin (ZOCOR) 20 MG tablet Take 20 mg by mouth every  evening.     . sodium chloride (OCEAN) 0.65 % SOLN nasal spray Place 1-2 sprays into both nostrils daily as needed for congestion.    [name] spironolactone (ALDACTONE) 25 MG tablet Take 1 tablet (25 mg total) by mouth daily. 30 tablet 4  . budesonide (PULMICORT) 0.5 MG/2ML nebulizer solution Take 2 mLs (0.5 mg total) by nebulization 2 (two) times daily. 60 mL 12   No current facility-administered medications for this visit.     Allergies  Allergen Reactions  . Statins Other (See Comments)    Muscle aches - tolerating Vytorin    Past Medical History:  Diagnosis Date  . Acute on chronic combined systolic and diastolic CHF (congestive heart failure) ([place]) 02/24/2015  . Anxiety   . Arthritis    "about q joint I've got" (09/22/2015)  . Atherosclerosis   . Calculus of  gallbladder with acute cholecystitis and obstruction   . CAP (community acquired pneumonia) 02/24/2015  . Cervical disc disease   . Chronic lower back pain    chronic back pain/under pain management  . Colon polyps    adenomatous and hyperplastic  . Colon polyps 08/02/2014   Tubular adenoma x 3, Hyperplastic-1  . COPD (chronic obstructive pulmonary disease) ([place])    a. Former Airline pilot, also smoked a pipe.  . Coronary artery calcification seen on CAT scan   . Depression   . Diastolic dysfunction    grade 1 diastolic dysfunction on 2-D echo  . Dyslipidemia   . Dyspnea    chronic  . Encounter for biliary drainage tube placement    remains with drainage bag to right side for  biliary drainage.  . Frequent urination   . Gallstones   . Gangrenous cholecystitis 08/17/2014  . GERD (gastroesophageal reflux disease)   . Hiatal hernia    sensation problem ("right lateral side hip to knee").  . History of blood transfusion "numerous"  . History of oxygen administration    @ 2 l/m nasally at bedtime.(09/22/2015)  . Hypertension   . Inguinal hernia    right side  . Left ventricular dysfunction    ejection fraction of 25-30% with regional wall motion abnormalities.  . Migraine    "years since I've had one" (09/22/2015)  . OSA (obstructive sleep apnea)    does not use CPAP; "just oxygen" (09/22/2015)  . Pneumonia ~ 1958  . PTSD (post-traumatic stress disorder)   . Rhinitis   . Sarcoidosis ([place])   . Sensation problem    right side-lateral hip to knee" decreased sensation and tingling feeling" -has informed Dr. Joylene Draft, Dr. Henrene Pastor, Dr. Lucia Gaskins of this.  . Sepsis ([place])   . Sinus congestion    10-27-14 at present some issues-"not bad"  . Steroid-induced hyperglycemia 02/25/2015  . Subdural hematoma ([place]) 11/10   a. 2010 s/p surgery - diagnosed several weeks after a fall.    Blood pressure 132/78, pulse 96.  No problem-specific Assessment & Plan notes found for this encounter.   Tommy Medal PharmD CPP [facility]

## 2016-05-31 ENCOUNTER — Telehealth: Payer: Self-pay | Admitting: Cardiovascular Disease

## 2016-05-31 NOTE — Telephone Encounter (Signed)
RN Coralie Keens  ES % is needed for this pt if you would give her a call back please  Pt want to participate in the CHF program.

## 2016-05-31 NOTE — Telephone Encounter (Signed)
Discussed w [employer] case manager w/ patient permission,  Last EF via echo and VS were reviewed and discussed. Aware to call if we may be of further assistance.

## 2016-06-10 ENCOUNTER — Ambulatory Visit (INDEPENDENT_AMBULATORY_CARE_PROVIDER_SITE_OTHER)
Admission: RE | Admit: 2016-06-10 | Discharge: 2016-06-10 | Disposition: A | Discharge status: Home / Self Care | Payer: Medicare Other | Source: Ambulatory Visit | Attending: Adult Health | Admitting: Adult Health

## 2016-06-10 ENCOUNTER — Ambulatory Visit (INDEPENDENT_AMBULATORY_CARE_PROVIDER_SITE_OTHER): Payer: Medicare Other | Admitting: Adult Health

## 2016-06-10 ENCOUNTER — Encounter: Payer: Self-pay | Admitting: Adult Health

## 2016-06-10 VITALS — BP 124/74 | HR 103 | Ht 71.0 in | Wt 260.6 lb

## 2016-06-10 DIAGNOSIS — J449 Chronic obstructive pulmonary disease, unspecified: Secondary | ICD-10-CM

## 2016-06-10 DIAGNOSIS — J441 Chronic obstructive pulmonary disease with (acute) exacerbation: Secondary | ICD-10-CM | POA: Diagnosis not present

## 2016-06-10 DIAGNOSIS — J9611 Chronic respiratory failure with hypoxia: Secondary | ICD-10-CM | POA: Diagnosis not present

## 2016-06-10 MED ORDER — AMOXICILLIN-POT CLAVULANATE 875-125 MG PO TABS: 1.0000 | ORAL_TABLET | Freq: Two times a day (BID) | ORAL | 0 refills | Status: AC

## 2016-06-10 MED ORDER — BUDESONIDE-FORMOTEROL FUMARATE 160-4.5 MCG/ACT IN AERO: 2.0000 | INHALATION_SPRAY | Freq: Two times a day (BID) | RESPIRATORY_TRACT | 0 refills | Status: DC

## 2016-06-10 MED ORDER — PREDNISONE 10 MG PO TABS: ORAL_TABLET | ORAL | 0 refills | Status: DC

## 2016-06-10 MED ORDER — LEVALBUTEROL HCL 0.63 MG/3ML IN NEBU
0.6300 mg | INHALATION_SOLUTION | Freq: Once | RESPIRATORY_TRACT | Status: AC
  Administered 2016-06-10: 0.63 mg via RESPIRATORY_TRACT

## 2016-06-10 NOTE — Progress Notes (Signed)
Subjective:    Patient ID: [name], male    DOB: [birth year], 75 y.o.   MRN: [id number]  HPI 75 yo former smoker with COPD, mild OSA, and sarcoidosis.   06/10/2016 Acute OV  Pt presents for an acute office visit. Complains of cough, congestion with thick yellow mucus , sinus drainage and wheezing for 5 days . Hs stuffy nose and congestion.  Denies chest pain, orthopnea or increased edema.  Appetite is okay , no n/v.   On Symbicort for his COPD doing okay until last week.   On o2 At bedtime  . Feels it helps.       TESTS: PSG May 2008>> AHI 8  Spirometry 11/18/08>>FEV1 2.15 (61%), FEV1% 62  CT chest 05/19/12>>superior segment LLL infiltrate, b/l hilar and mediastinal LAN with calcification CT chest 07/13/12 >> atherosclerosis, Lt hilar adenopathy, LLL superior segment narrowing, mass like opacity superior segment LLL 5.4 x 1.9 cm, multiple nodular areas LLL, diffuse GGO LLL PET scan 07/22/12 >> mildly hypermetabolic mediastinal adenopathy (SUV 5.8), superior segment nodules hypermetabolic (SUV 17.4) Bronchoscopy 07/28/12 >> focal granulomatous inflammation 07/31/12 >>start prednisone trial; ACE 37 CT chest 08/31/12 >> decreased size of lesions CT chest 12/02/12 >> borderline mediastinal/hilar LAN, nodular septal thickening b/l, no change superior segment LLL, centrilobular emphysema CT chest 03/19/13 >> ?slight increase in LLL consolidative change, borderline LAN CT chest 08/24/13 >> 1.9 cm Rt paratracheal LAN, calcified 2.1 cm subcarinal LAN, narrowing of mainstem bronchi, chronic infiltrate LLL CT chest 03/16/14 >> atherosclerosis, no change in mediastinal and b/l hilar LAN, no change in LLL mass like opacity, thickening of interstitium, scattered sub-pleural nodules, small Lt pleural effusion, gallstones CT chest 08/19/14 >> small b/l effusions, emphysema, no change LLL  Sleep tests PSG May 2008>> AHI 8   Pulmonary tests Spirometry 11/18/08>>FEV1 2.15 (61%), FEV1% 62    Bronchoscopy 07/28/12 >> focal granulomatous inflammation Spirometry 03/14/14 >> FEV1 2.10 (60%), FEV1% 57  Cardiac tests Echo 08/19/14 >> EF 50 to 55%, grade 1 DD Echo 04/19/15 >> EF 30 to 35% Echo 10/27/15 >> EF 35 to 40%, grade 1 DD  Review of Systems Constitutional:   No  weight loss, night sweats,  Fevers, chills,  +fatigue, or  lassitude.  HEENT:   No headaches,  Difficulty swallowing,  Tooth/dental problems, or  Sore throat,                No sneezing, itching, ear ache,  +nasal congestion, post nasal drip,   CV:  No chest pain,  Orthopnea, PND, swelling in lower extremities, anasarca, dizziness, palpitations, syncope.   GI  No heartburn, indigestion, abdominal pain, nausea, vomiting, diarrhea, change in bowel habits, loss of appetite, bloody stools.   Resp:    No chest wall deformity  Skin: no rash or lesions.  GU: no dysuria, change in color of urine, no urgency or frequency.  No flank pain, no hematuria   MS:  No joint pain or swelling.  No decreased range of motion.  No back pain.  Psych:  No change in mood or affect. No depression or anxiety.  No memory loss.            Objective:   Physical Exam   Vitals:   06/10/16 1545  BP: 124/74  Pulse: (!) 103  SpO2: 95%  Weight: 260 lb 9.6 oz (118.2 kg)  Height: '5\' 11"'$  (1.803 m)  GEN: A/Ox3; pleasant , NAD, elderly    HEENT:  [place]/AT,  EACs-clear, TMs-wnl, NOSE-clear,  THROAT-clear, no lesions, no postnasal drip or exudate noted.   NECK:  Supple w/ fair ROM; no JVD; normal carotid impulses w/o bruits; no thyromegaly or nodules palpated; no lymphadenopathy.    RESP  Decreased BS in bases w/ few trace rhonchi  no accessory muscle use, no dullness to percussion  CARD:  RRR, no m/r/g  , no peripheral edema, pulses intact, no cyanosis or clubbing.  GI:   Soft & nt; nml bowel sounds; no organomegaly or masses detected.   Musco: Warm bil, no deformities or joint swelling noted.   Neuro: alert, no focal deficits  noted.    Skin: Warm, no lesions or rashes    Annlee Glandon NP-C  [facility]  06/10/2016

## 2016-06-10 NOTE — Patient Instructions (Addendum)
Augmentin '875mg'$  Twice daily  For 10 days , take w/ food  Mucinex DM Twice daily  As needed  Cough/congestion  Prednisone taper over next week.  Chest xray today .  Saline nasal rinses As needed   Please contact office for sooner follow up if symptoms do not improve or worsen or seek emergency care  Follow up with Dr. Halford Chessman in 3 months and As needed

## 2016-06-11 NOTE — Progress Notes (Signed)
Reviewed and agree with assessment/plan.  Chesley Mires, MD [facility] Pulmonary/Critical Care 06/11/2016, 12:02 AM Pager:  [phone]

## 2016-06-13 ENCOUNTER — Other Ambulatory Visit: Payer: Self-pay | Admitting: Adult Health

## 2016-06-14 ENCOUNTER — Telehealth: Payer: Self-pay | Admitting: Pulmonary Disease

## 2016-06-14 DIAGNOSIS — J449 Chronic obstructive pulmonary disease, unspecified: Secondary | ICD-10-CM

## 2016-06-14 NOTE — Assessment & Plan Note (Signed)
Cont on O2 At bedtime   

## 2016-06-14 NOTE — Progress Notes (Signed)
LMTCB

## 2016-06-14 NOTE — Assessment & Plan Note (Signed)
Flare  Check cxr today   Plan  Patient Instructions  Augmentin '875mg'$  Twice daily  For 10 days , take w/ food  Mucinex DM Twice daily  As needed  Cough/congestion  Prednisone taper over next week.  Chest xray today .  Saline nasal rinses As needed   Please contact office for sooner follow up if symptoms do not improve or worsen or seek emergency care  Follow up with Dr. Halford Chessman in 3 months and As needed

## 2016-06-14 NOTE — Telephone Encounter (Signed)
Pt aware of results and voiced his understanding. Pt scheduled for 07/05/16 with TP. cxr ordered. Nothing further needed.

## 2016-07-05 ENCOUNTER — Ambulatory Visit (INDEPENDENT_AMBULATORY_CARE_PROVIDER_SITE_OTHER): Payer: Medicare Other | Admitting: Adult Health

## 2016-07-05 ENCOUNTER — Encounter: Payer: Self-pay | Admitting: Adult Health

## 2016-07-05 ENCOUNTER — Ambulatory Visit (INDEPENDENT_AMBULATORY_CARE_PROVIDER_SITE_OTHER)
Admission: RE | Admit: 2016-07-05 | Discharge: 2016-07-05 | Disposition: A | Discharge status: Home / Self Care | Payer: Medicare Other | Source: Ambulatory Visit | Attending: Adult Health | Admitting: Adult Health

## 2016-07-05 DIAGNOSIS — J9611 Chronic respiratory failure with hypoxia: Secondary | ICD-10-CM

## 2016-07-05 DIAGNOSIS — J449 Chronic obstructive pulmonary disease, unspecified: Secondary | ICD-10-CM | POA: Diagnosis not present

## 2016-07-05 DIAGNOSIS — J441 Chronic obstructive pulmonary disease with (acute) exacerbation: Secondary | ICD-10-CM | POA: Diagnosis not present

## 2016-07-05 MED ORDER — IPRATROPIUM BROMIDE 0.02 % IN SOLN: 0.5000 mg | Freq: Four times a day (QID) | RESPIRATORY_TRACT | 12 refills | Status: DC

## 2016-07-05 MED ORDER — PREDNISONE 10 MG PO TABS: ORAL_TABLET | ORAL | 0 refills | Status: DC

## 2016-07-05 NOTE — Assessment & Plan Note (Signed)
Slow to resolve  CXR shows chronic interstitial changes -will follow up CT chest to look to see if these changes have progressed.   Plan  Patient Instructions  Set up for HRCT chest .  Prednisone taper over next week.  Mucinex DM Twice daily  As needed  Cough/congestion  Add Atrovent Neb .Four times a day  .  Continue on Symbicort Twice daily , rinse after use.  follow up Dr. Halford Chessman  In 2 months and As needed   Please contact office for sooner follow up if symptoms do not improve or worsen or seek emergency care

## 2016-07-05 NOTE — Progress Notes (Signed)
I have reviewed and agree with assessment/plan.  Chesley Mires, MD [facility] Pulmonary/Critical Care 07/05/2016, 4:14 PM Pager:  [phone]

## 2016-07-05 NOTE — Progress Notes (Signed)
$'@Patient'F$  ID: Travis Ferguson, male    DOB: 1941/04/11, 76 y.o.   MRN: [id number]  Chief Complaint  Patient presents with  . Follow-up    COPD     Referring provider: Crist Infante, MD  HPI: 76 yo former smoker with COPD, mild OSA, and sarcoidosis.  TEST  TESTS: PSG May 2008>> AHI 8  Spirometry 11/18/08>>FEV1 2.15 (61%), FEV1% 62  CT chest 05/19/12>>superior segment LLL infiltrate, b/l hilar and mediastinal LAN with calcification CT chest 07/13/12 >> atherosclerosis, Lt hilar adenopathy, LLL superior segment narrowing, mass like opacity superior segment LLL 5.4 x 1.9 cm, multiple nodular areas LLL, diffuse GGO LLL PET scan 07/22/12 >> mildly hypermetabolic mediastinal adenopathy (SUV 5.8), superior segment nodules hypermetabolic (SUV 03.4) Bronchoscopy 07/28/12 >> focal granulomatous inflammation 07/31/12 >>start prednisone trial; ACE 37 CT chest 08/31/12 >> decreased size of lesions CT chest 12/02/12 >> borderline mediastinal/hilar LAN, nodular septal thickening b/l, no change superior segment LLL, centrilobular emphysema CT chest 03/19/13 >> ?slight increase in LLL consolidative change, borderline LAN CT chest 08/24/13 >> 1.9 cm Rt paratracheal LAN, calcified 2.1 cm subcarinal LAN, narrowing of mainstem bronchi, chronic infiltrate LLL CT chest 03/16/14 >> atherosclerosis, no change in mediastinal and b/l hilar LAN, no change in LLL mass like opacity, thickening of interstitium, scattered sub-pleural nodules, small Lt pleural effusion, gallstones CT chest 08/19/14 >> small b/l effusions, emphysema, no change LLL  Sleep tests PSG May 2008>>AHI 8   Pulmonary tests Spirometry 11/18/08>>FEV1 2.15 (61%), FEV1% 62  Bronchoscopy 07/28/12 >> focal granulomatous inflammation Spirometry 03/14/14 >> FEV1 2.10 (60%), FEV1% 57  Cardiac tests Echo 08/19/14 >>EF 50 to 55%, grade 1 DD Echo 04/19/15 >> EF 30 to 35% Echo 10/27/15 >> EF 35 to 40%, grade 1 DD Echo 04/2016 >EF 40-45%, gr 1 DD , PAP 29    07/05/2016 Follow up : COPD  Pt returns for 3 week follow up . Seen last ov with COPD flare , tx w/ Augmentin and Prednisone taper .  CXXR showed BB atx vs increased interstitial marking R>L . Since last ov he is feeling he is not feeling any better still gets winded very easily w/ minimal activity . Has intermittent wheezing and cough . Cough may be some better. Feels worn out with no energy.  He did feel better on Abx and steroid.  Repeat CXR today shows BB interstitial prominence ?chronic ILD changes  He denies chest pain orthopnea or edmea. No calf pain or swelling .  Appetite is ok w/ no n/v.  Has upcoming ov with Neurology to check for possible Parkinson.   On O2 at At bedtime  . Does drop O2 with walking 85-88%.O2 . Refuses to go on O2 during daytime.   On Symbicort Twice daily  . No longer taking brovana/pulmicort . Has tried Spiriva in past. Can not afford any inhaler due to cost of all his meds .   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 Former smoker quit 1995. From local area. No unusual hobbies/pets/travel.   Allergies  Allergen Reactions  . Statins Other (See Comments)    Muscle aches - tolerating Vytorin    Immunization History  Administered Date(s) Administered  . Influenza Split 01/23/2011, 03/24/2013, 04/05/2014, 03/06/2015  . Influenza Whole 03/27/2009, 03/16/2010, 02/23/2012  . Influenza, High Dose Seasonal PF 02/19/2016  . Pneumococcal Polysaccharide-23 04/27/2007, 10/22/2012    Past Medical History:  Diagnosis Date  . Acute on chronic combined systolic and diastolic CHF (congestive heart failure) ([place]) 02/24/2015  .  Anxiety   . Arthritis    "about q joint I've got" (09/22/2015)  . Atherosclerosis   . Calculus of gallbladder with acute cholecystitis and obstruction   . CAP (community acquired pneumonia) 02/24/2015  . Cervical disc disease   . Chronic lower back pain    chronic back pain/under pain management  . Colon polyps    adenomatous and  hyperplastic  . Colon polyps 08/02/2014   Tubular adenoma x 3, Hyperplastic-1  . COPD (chronic obstructive pulmonary disease) ([place])    a. Former Airline pilot, also smoked a pipe.  . Coronary artery calcification seen on CAT scan   . Depression   . Diastolic dysfunction    grade 1 diastolic dysfunction on 2-D echo  . Dyslipidemia   . Dyspnea    chronic  . Encounter for biliary drainage tube placement    remains with drainage bag to right side for  biliary drainage.  . Frequent urination   . Gallstones   . Gangrenous cholecystitis 08/17/2014  . GERD (gastroesophageal reflux disease)   . Hiatal hernia    sensation problem ("right lateral side hip to knee").  . History of blood transfusion "numerous"  . History of oxygen administration    @ 2 l/m nasally at bedtime.(09/22/2015)  . Hypertension   . Inguinal hernia    right side  . Left ventricular dysfunction    ejection fraction of 25-30% with regional wall motion abnormalities.  . Migraine    "years since I've had one" (09/22/2015)  . OSA (obstructive sleep apnea)    does not use CPAP; "just oxygen" (09/22/2015)  . Pneumonia ~ 1958  . PTSD (post-traumatic stress disorder)   . Rhinitis   . Sarcoidosis ([place])   . Sensation problem    right side-lateral hip to knee" decreased sensation and tingling feeling" -has informed Dr. Joylene Draft, Dr. Henrene Pastor, Dr. Lucia Gaskins of this.  . Sepsis ([place])   . Sinus congestion    10-27-14 at present some issues-"not bad"  . Steroid-induced hyperglycemia 02/25/2015  . Subdural hematoma ([place]) 11/10   a. 2010 s/p surgery - diagnosed several weeks after a fall.    Tobacco History: History  Smoking Status  . Former Smoker  . Packs/day: 0.00  . Years: 30.00  . Types: Pipe  . Quit date: 06/24/2008  Smokeless Tobacco  . Never Used   Counseling given: Not Answered   Outpatient Encounter Prescriptions as of 07/05/2016  Medication Sig  . albuterol (PROVENTIL) (2.5 MG/3ML) 0.083% nebulizer solution Take 3 mLs  (2.5 mg total) by nebulization every 4 (four) hours as needed for wheezing or shortness of breath.  . budesonide-formoterol (SYMBICORT) 160-4.5 MCG/ACT inhaler Inhale 2 puffs into the lungs 2 (two) times daily.  . DULoxetine (CYMBALTA) 30 MG capsule Take 30 mg by mouth daily.  [name] ENTRESTO 24-26 MG TAKE 1 TABLET BY MOUTH 2 (TWO) TIMES DAILY.  . finasteride (PROSCAR) 5 MG tablet Take 5 mg by mouth daily.   [name] FLUoxetine (PROZAC) 40 MG capsule Take 40 mg by mouth daily with breakfast.   . furosemide (LASIX) 20 MG tablet Take 1 tablet (20 mg total) by mouth daily.  [name] HYDROcodone-acetaminophen (NORCO/VICODIN) 5-325 MG tablet Take 1 tablet by mouth every 6 (six) hours as needed for moderate pain.  . metoprolol tartrate (LOPRESSOR) 25 MG tablet TAKE 1 TABLET (25 MG TOTAL) BY MOUTH 2 (TWO) TIMES DAILY.  . Multiple Vitamin (MULTIVITAMIN) tablet Take 1 tablet by mouth daily.  . OXYGEN Inhale 2 L/min into the lungs  at bedtime.  [name] PROAIR HFA 108 (90 BASE) MCG/ACT inhaler 1-2 PUFFS EVERY 4-6 HOURS AS NEEDED (Patient taking differently: 1-2 PUFFS EVERY 4-6 HOURS AS NEEDED for shortness of breath)  . simvastatin (ZOCOR) 20 MG tablet Take 20 mg by mouth every evening.   . sodium chloride (OCEAN) 0.65 % SOLN nasal spray Place 1-2 sprays into both nostrils daily as needed for congestion.  [name] spironolactone (ALDACTONE) 25 MG tablet Take 1 tablet (25 mg total) by mouth daily.  . budesonide (PULMICORT) 0.5 MG/2ML nebulizer solution Take 2 mLs (0.5 mg total) by nebulization 2 (two) times daily. (Patient not taking: Reported on 07/05/2016)  . NUCYNTA 50 MG tablet Take 1 tablet by mouth daily.  . predniSONE (DELTASONE) 10 MG tablet 4 tabs for 2 days, then 3 tabs for 2 days, 2 tabs for 2 days, then 1 tab for 2 days, then stop  . [DISCONTINUED] arformoterol (BROVANA) 15 MCG/2ML NEBU Take 2 mLs (15 mcg total) by nebulization 2 (two) times daily. (Patient not taking: Reported on 07/05/2016)  . [DISCONTINUED] budesonide-formoterol  (SYMBICORT) 160-4.5 MCG/ACT inhaler Inhale 2 puffs into the lungs 2 (two) times daily.  . [DISCONTINUED] predniSONE (DELTASONE) 10 MG tablet 4 tabs for 2 days, then 3 tabs for 2 days, 2 tabs for 2 days, then 1 tab for 2 days, then stop   No facility-administered encounter medications on file as of 07/05/2016.      Review of Systems  Constitutional:   No  weight loss, night sweats,  Fevers, chills,  +fatigue, or  lassitude.  HEENT:   No headaches,  Difficulty swallowing,  Tooth/dental problems, or  Sore throat,                No sneezing, itching, ear ache, +nasal congestion, post nasal drip,   CV:  No chest pain,  Orthopnea, PND, swelling in lower extremities, anasarca, dizziness, palpitations, syncope.   GI  No heartburn, indigestion, abdominal pain, nausea, vomiting, diarrhea, change in bowel habits, loss of appetite, bloody stools.   Resp:   No chest wall deformity  Skin: no rash or lesions.  GU: no dysuria, change in color of urine, no urgency or frequency.  No flank pain, no hematuria   MS:  No joint pain or swelling.  No decreased range of motion.  No back pain.    Physical Exam  BP 120/70 (BP Location: Left Arm, Cuff Size: Normal)   Pulse (!) 118   Temp 97.9 F (36.6 C) (Oral)   Ht '5\' 11"'$  (1.803 m)   Wt 261 lb 12.8 oz (118.8 kg)   SpO2 90%   BMI 36.51 kg/m   GEN: A/Ox3; pleasant , NAD, elderly , chronically ill appearing    HEENT:  [place]/AT,  EACs-clear, TMs-wnl, NOSE-clear, THROAT-clear, no lesions, no postnasal drip or exudate noted.   NECK:  Supple w/ fair ROM; no JVD; normal carotid impulses w/o bruits; no thyromegaly or nodules palpated; no lymphadenopathy.    RESP  Decreased BS in bases . no accessory muscle use, no dullness to percussion  CARD:  RRR, no m/r/g, no peripheral edema, pulses intact, no cyanosis or clubbing.  GI:   Soft & nt; nml bowel sounds; no organomegaly or masses detected.   Musco: Warm bil, no deformities or joint swelling noted.    Neuro: alert, no focal deficits noted.    Skin: Warm, no lesions or rashes  Psych:  No change in mood or affect. No depression or anxiety.  No memory loss.  Lab Results:  CBC    Component Value Date/Time   WBC 9.7 09/14/2015 1427   RBC 4.94 09/14/2015 1427   HGB 11.9 (L) 09/23/2015 0615   HCT 35.6 (L) 09/23/2015 0615   PLT 252 09/14/2015 1427   MCV 86.0 09/14/2015 1427   MCH 28.3 09/14/2015 1427   MCHC 32.9 09/14/2015 1427   RDW 14.2 09/14/2015 1427   LYMPHSABS 4.5 (H) 02/24/2015 0717   MONOABS 1.4 (H) 02/24/2015 0717   EOSABS 0.4 02/24/2015 0717   BASOSABS 0.0 02/24/2015 0717    BMET    Component Value Date/Time   NA 136 09/23/2015 0615   K 4.2 09/23/2015 0615   CL 106 09/23/2015 0615   CO2 24 09/23/2015 0615   GLUCOSE 108 (H) 09/23/2015 0615   BUN 10 09/23/2015 0615   CREATININE 0.85 09/23/2015 0615   CREATININE 1.00 05/29/2015 1516   CALCIUM 8.7 (L) 09/23/2015 0615   GFRNONAA >60 09/23/2015 0615   GFRAA >60 09/23/2015 0615    BNP    Component Value Date/Time   BNP 424.0 (H) 02/24/2015 0717   BNP 425.7 (H) 01/12/2015 1009    ProBNP    Component Value Date/Time   PROBNP 534.0 (H) 12/29/2014 1539    Imaging: Dg Chest 2 View  Result Date: 07/05/2016 CLINICAL DATA:  Increasing cough, congestion EXAM: CHEST  2 VIEW COMPARISON:  06/10/2016 FINDINGS: There is bibasilar interstitial prominence likely reflecting chronic interstitial disease. There is no focal consolidation. There is no pleural effusion or pneumothorax. The heart mediastinum are stable. 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 IMPRESSION: No active cardiopulmonary disease. Electronically Signed   By: Kathreen Devoid   On: 07/05/2016 13:45   Dg Chest 2 View  Result Date: 06/11/2016 CLINICAL DATA:  Severe shortness of breath, chest tightness, cough and chest congestion for the 5 days. History of sarcoidosis and CHF. EXAM: CHEST  2 VIEW COMPARISON:  PA and lateral chest x-ray of  March 13, 2015. FINDINGS: The lungs are less well inflated today. The interstitial markings remain increased predominantly at the lung bases. This is slightly more conspicuous especially on the right. There is no pleural effusion. The heart is top-normal in size. The pulmonary vascularity is not engorged. There is calcification in the wall of the aortic arch. There is multilevel degenerative disc disease of the thoracic spine. There is a prosthetic left shoulder joint. IMPRESSION: Mild hypoinflation. Increased bibasilar atelectasis or interstitial infiltrate greatest on the right. No alveolar pneumonia nor pulmonary edema. Followup PA and lateral chest X-ray is recommended in 3-4 weeks following trial of antibiotic therapy to ensure resolution and exclude underlying malignancy. Thoracic aortic atherosclerosis. Electronically Signed   By: David  Martinique M.D.   On: 06/11/2016 07:20     Assessment & Plan:   COPD with acute exacerbation Slow to resolve  CXR shows chronic interstitial changes -will follow up CT chest to look to see if these changes have progressed.   Plan  Patient Instructions  Set up for HRCT chest .  Prednisone taper over next week.  Mucinex DM Twice daily  As needed  Cough/congestion  Add Atrovent Neb .Four times a day  .  Continue on Symbicort Twice daily , rinse after use.  follow up Dr. Halford Chessman  In 2 months and As needed   Please contact office for sooner follow up if symptoms do not improve or worsen or seek emergency care       Chronic respiratory failure ([place]) Cont on o2  At bedtime   Would like for him to start on o2 w/ act but he refuses.       Rexene Edison, NP 07/05/2016

## 2016-07-05 NOTE — Addendum Note (Signed)
Addended by: Doroteo Glassman D on: 07/05/2016 02:56 PM   Modules accepted: Orders

## 2016-07-05 NOTE — Patient Instructions (Addendum)
Set up for HRCT chest .  Prednisone taper over next week.  Mucinex DM Twice daily  As needed  Cough/congestion  Add Atrovent Neb .Four times a day  .  Continue on Symbicort Twice daily , rinse after use.  follow up Dr. Halford Chessman  In 2 months and As needed   Please contact office for sooner follow up if symptoms do not improve or worsen or seek emergency care

## 2016-07-05 NOTE — Assessment & Plan Note (Signed)
Cont on o2 At bedtime   Would like for him to start on o2 w/ act but he refuses.

## 2016-07-08 ENCOUNTER — Encounter: Payer: Self-pay | Admitting: Neurology

## 2016-07-08 ENCOUNTER — Ambulatory Visit (INDEPENDENT_AMBULATORY_CARE_PROVIDER_SITE_OTHER): Payer: Medicare Other | Admitting: Neurology

## 2016-07-08 VITALS — BP 116/70 | HR 82 | Resp 20 | Ht 71.0 in | Wt 256.0 lb

## 2016-07-08 DIAGNOSIS — Z8679 Personal history of other diseases of the circulatory system: Secondary | ICD-10-CM

## 2016-07-08 DIAGNOSIS — R251 Tremor, unspecified: Secondary | ICD-10-CM

## 2016-07-08 DIAGNOSIS — F439 Reaction to severe stress, unspecified: Secondary | ICD-10-CM | POA: Diagnosis not present

## 2016-07-08 NOTE — Patient Instructions (Addendum)
Your left hand tremor is mild and you have a slight right hand tremor, but your exam is not in keeping with parkinsonism.  Please remember, that any kind of tremor may be exacerbated by anxiety, anger, nervousness, excitement, dehydration, sleep deprivation, by caffeine, and low blood sugar values or blood sugar fluctuations. Some medications, especially some antidepressants and lithium can cause or exacerbate tremors. Tremors may temporarily calm down or subside with the use of a benzodiazepine such as Valium or related medications and with alcohol. Be aware, however, that drinking alcohol is not an approved or appropriate treatment for tremor control and long-term use of benzodiazepines such as Valium, lorazepam, alprazolam, or clonazepam can cause habit formation, physical and psychological addiction. There are very few medications that symptomatically help with tremor reduction, none are without potential side effects.   As discussed, your tremor may be in part related to your prednisone, prozac and your inhalers/nebulizer, plus may easily be made worse with tension/anxiety.  We will do a brain scan, called MRI and call you with the test results. We will have to schedule you for this on a separate date. This test requires authorization from your insurance, and we will take care of the insurance process.

## 2016-07-08 NOTE — Progress Notes (Signed)
Subjective:    Patient ID: Travis Ferguson is a 76 y.o. male.  HPI     Star Age, MD, PhD [facility] [address], [address] [place],  [address]   Dear Dr. Veverly Fells,  I saw your patient, Travis Ferguson, upon your kind request in my neurologic clinic today for initial consultation of his hand tremors. The patient is unaccompanied today. As you know, Travis Ferguson is a 76 year old right-handed gentleman with an underlying complicated medical history of COPD, on oxygen treatment at night, sarcoidosis, former smoking, mild obstructive sleep apnea, history of subdural hematoma post fall in 2010, requiring surgery, hypertension, reflux disease and hiatal hernia, diastolic dysfunction, degenerative neck disease and low back pain, arthritis, anxiety, depression, diastolic dysfunction, hyperlipidemia, status post lumbar laminectomy, history of traumatic rotator cuff tear on the left, status post left total shoulder replacement in April 2017, status post left ulnar nerve transposition in August 2017, who reports a tremor in his LUE for the past several months, L more than R and he feels like the tremor started after his left shoulder surgery. he is worried about developing Parkinson's disease. He does admit to a tremendous amount of stress and history of anxiety and depression, also inpatient treatment for his depression before. He has been on Prozac generic 40 mg daily and recently we started a prednisone taper for his COPD. Last time he was on prednisone was a few weeks ago and he restarted prednisone at 40 mg yesterday with taper over the course of about 8 days. He lives with his wife. He is a retired Airline pilot. He has 2 grown children.  He quit smoking the pipe in 2010, does not drink alcohol. I reviewed your office note from 01/23/2016 as well as from 05/31/2016. He had a brain MRI wo contrast on 05/09/2009, which I reviewed: IMPRESSION: Moderate to large subacute  subdural hematoma in the right frontal parietal lobe with mass effect and mild midline shift.   His Past Medical History Is Significant For: Past Medical History:  Diagnosis Date  . Acute on chronic combined systolic and diastolic CHF (congestive heart failure) ([place]) 02/24/2015  . Anxiety   . Arthritis    "about q joint I've got" (09/22/2015)  . Atherosclerosis   . Calculus of gallbladder with acute cholecystitis and obstruction   . CAP (community acquired pneumonia) 02/24/2015  . Cervical disc disease   . Chronic lower back pain    chronic back pain/under pain management  . Colon polyps    adenomatous and hyperplastic  . Colon polyps 08/02/2014   Tubular adenoma x 3, Hyperplastic-1  . COPD (chronic obstructive pulmonary disease) ([place])    a. Former Airline pilot, also smoked a pipe.  . Coronary artery calcification seen on CAT scan   . Depression   . Diastolic dysfunction    grade 1 diastolic dysfunction on 2-D echo  . Dyslipidemia   . Dyspnea    chronic  . Encounter for biliary drainage tube placement    remains with drainage bag to right side for  biliary drainage.  . Frequent urination   . Gallstones   . Gangrenous cholecystitis 08/17/2014  . GERD (gastroesophageal reflux disease)   . Hiatal hernia    sensation problem ("right lateral side hip to knee").  . History of blood transfusion "numerous"  . History of oxygen administration    @ 2 l/m nasally at bedtime.(09/22/2015)  . Hypertension   . Inguinal hernia    right side  .  Left ventricular dysfunction    ejection fraction of 25-30% with regional wall motion abnormalities.  . Migraine    "years since I've had one" (09/22/2015)  . OSA (obstructive sleep apnea)    does not use CPAP; "just oxygen" (09/22/2015)  . Pneumonia ~ 1958  . PTSD (post-traumatic stress disorder)   . Rhinitis   . Sarcoidosis ([place])   . Sensation problem    right side-lateral hip to knee" decreased sensation and tingling feeling" -has informed Dr.  Joylene Draft, Dr. Henrene Pastor, Dr. Lucia Gaskins of this.  . Sepsis ([place])   . Sinus congestion    10-27-14 at present some issues-"not bad"  . Steroid-induced hyperglycemia 02/25/2015  . Subdural hematoma ([place]) 11/10   a. 2010 s/p surgery - diagnosed several weeks after a fall.    His Past Surgical History Is Significant For: Past Surgical History:  Procedure Laterality Date  . BACK SURGERY    . BRAIN SURGERY  04/2009   right frontal"hemorrhage evacuation from fall injury"  . CARDIAC CATHETERIZATION N/A 01/23/2015   Procedure: Right/Left Heart Cath and Coronary Angiography;  Surgeon: Lorretta Harp, MD;  Location: [facility];  Service: Cardiovascular;  Laterality: N/A;  . CHOLECYSTECTOMY N/A 11/08/2014   Procedure: LAPAROSCOPIC CHOLECYSTECTOMY ;  Surgeon: Alphonsa Overall, MD;  Location: [facility];  Service: General;  Laterality: N/A;  . ERCP N/A 11/07/2014   Procedure: ENDOSCOPIC RETROGRADE CHOLANGIOPANCREATOGRAPHY (ERCP);  Surgeon: Irene Shipper, MD;  Location: [facility];  Service: Endoscopy;  Laterality: N/A;  . GALLBLADDER SURGERY  09/2014   drain tube placed  . INGUINAL HERNIA REPAIR Left   . KNEE ARTHROSCOPY Right   . LUMBAR LAMINECTOMY/DECOMPRESSION MICRODISCECTOMY Right 12/15/2012   Procedure: LUMBAR LAMINECTOMY/DECOMPRESSION MICRODISCECTOMY 1 LEVEL;  Surgeon: Elaina Hoops, MD;  Location: [facility];  Service: Neurosurgery;  Laterality: Right;  Right Lumbar four-five laminectomy/foraminotomy  . REVERSE SHOULDER ARTHROPLASTY Left 09/22/2015   Procedure: LEFT REVERSE TOTAL SHOULDER ARTHROPLASTY;  Surgeon: Netta Cedars, MD;  Location: [facility];  Service: Orthopedics;  Laterality: Left;  . REVERSE TOTAL SHOULDER ARTHROPLASTY Left 09/22/2015  . VIDEO BRONCHOSCOPY  07/28/2012   Procedure: VIDEO BRONCHOSCOPY WITH FLUORO;  Surgeon: Chesley Mires, MD;  Location: [facility];  Service: Cardiopulmonary;  Laterality: Bilateral;    His Family History Is Significant For: Family History  Problem Relation Age of Onset   . Heart disease Mother   . Heart disease Father   . Colon polyps Brother   . Congestive Heart Failure Brother   . Coronary artery disease    . Stomach cancer Paternal Grandmother   . Colon cancer Neg Hx   . Pancreatic cancer Neg Hx   . Rectal cancer Neg Hx   . Esophageal cancer Neg Hx     His Social History Is Significant For: Social History   Social History  . Marital status: Married    Spouse name: N/A  . Number of children: 2  . Years of education: 14   Occupational History  . retired Airline pilot in [place]  . Smoking status: Former Smoker    Packs/day: 0.00    Years: 30.00    Types: Pipe    Quit date: 06/24/2008  . Smokeless tobacco: Never Used  . Alcohol use No  . Drug use: No  . Sexual activity: Not Currently   Other Topics Concern  . None   Social History Narrative   Drinks about 6-7 caffeine drinks a day  His Allergies Are:  Allergies  Allergen Reactions  . Statins Other (See Comments)    Muscle aches - tolerating Vytorin  :   His Current Medications Are:  Outpatient Encounter Prescriptions as of 07/08/2016  Medication Sig  . budesonide-formoterol (SYMBICORT) 160-4.5 MCG/ACT inhaler Inhale 2 puffs into the lungs 2 (two) times daily.  . DULoxetine (CYMBALTA) 30 MG capsule Take 30 mg by mouth daily.  [name] ENTRESTO 24-26 MG TAKE 1 TABLET BY MOUTH 2 (TWO) TIMES DAILY.  . finasteride (PROSCAR) 5 MG tablet Take 5 mg by mouth daily.   [name] FLUoxetine (PROZAC) 40 MG capsule Take 40 mg by mouth daily with breakfast.   . furosemide (LASIX) 20 MG tablet Take 1 tablet (20 mg total) by mouth daily.  [name] HYDROcodone-acetaminophen (NORCO/VICODIN) 5-325 MG tablet Take 1 tablet by mouth every 6 (six) hours as needed for moderate pain.  [name] ipratropium (ATROVENT) 0.02 % nebulizer solution Take 2.5 mLs (0.5 mg total) by nebulization 4 (four) times daily.  . metoprolol tartrate (LOPRESSOR) 25 MG tablet TAKE 1 TABLET (25 MG TOTAL) BY MOUTH 2 (TWO)  TIMES DAILY.  . Multiple Vitamin (MULTIVITAMIN) tablet Take 1 tablet by mouth daily.  . OXYGEN Inhale 2 L/min into the lungs at bedtime.  . predniSONE (DELTASONE) 10 MG tablet 4 tabs for 2 days, then 3 tabs for 2 days, 2 tabs for 2 days, then 1 tab for 2 days, then stop  . PROAIR HFA 108 (90 BASE) MCG/ACT inhaler 1-2 PUFFS EVERY 4-6 HOURS AS NEEDED (Patient taking differently: 1-2 PUFFS EVERY 4-6 HOURS AS NEEDED for shortness of breath)  . simvastatin (ZOCOR) 20 MG tablet Take 20 mg by mouth every evening.   . sodium chloride (OCEAN) 0.65 % SOLN nasal spray Place 1-2 sprays into both nostrils daily as needed for congestion.  [name] spironolactone (ALDACTONE) 25 MG tablet Take 1 tablet (25 mg total) by mouth daily.  . [DISCONTINUED] albuterol (PROVENTIL) (2.5 MG/3ML) 0.083% nebulizer solution Take 3 mLs (2.5 mg total) by nebulization every 4 (four) hours as needed for wheezing or shortness of breath.  . [DISCONTINUED] NUCYNTA 50 MG tablet Take 1 tablet by mouth daily.   No facility-administered encounter medications on file as of 07/08/2016.   :   Review of Systems:  Out of a complete 14 point review of systems, all are reviewed and negative with the exception of these symptoms as listed below:   Review of Systems  Neurological:       Patient had L shoulder surgery and "realignment of the nerve in his L arm". Patient states that he now has tremors in his L hand. Pain in L arm.       Objective:  Neurologic Exam  Physical Exam Physical Examination:   Vitals:   07/08/16 1414  BP: 116/70  Pulse: 82  Resp: 20    General Examination: The patient is a very pleasant 76 y.o. male in no acute distress. He appears well-developed and well-nourished and well groomed. He is mildly anxious appearing.  HEENT: Normocephalic, atraumatic, pupils are equal, round and reactive to light and accommodation. Extraocular tracking is good without limitation to gaze excursion or nystagmus noted. Normal smooth  pursuit is noted. Hearing is grossly intact. Face is symmetric with normal facial animation and normal facial sensation. Speech is clear with no dysarthria noted. There is no hypophonia. There is no lip, neck/head, jaw or voice tremor. Neck is supple with full range of passive and active motion. There are no carotid  bruits on auscultation. Oropharynx exam reveals: mild mouth dryness. Tongue protrudes centrally and palate elevates symmetrically.   Chest: Clear to auscultation with mild wheezing, no rhonchi or crackles noted.  Heart: S1+S2+0, regular and normal without murmurs, rubs or gallops noted.   Abdomen: Soft, non-tender and non-distended with normal bowel sounds appreciated on auscultation.  Extremities: There is trace pitting edema in the distal lower extremities bilaterally.   Skin: Warm and dry without trophic changes noted. Bruising of skin noted.   Musculoskeletal: Decreased range of motion left shoulder and left elbow, mild thenar and hyperthenar muscle wasting both hands. Reports left knee pain, mild swelling of left knee noted.   Neurologically:  Mental status: The patient is awake, alert and oriented in all 4 spheres. His immediate and remote memory, attention, language skills and fund of knowledge are appropriate. There is no evidence of aphasia, agnosia, apraxia or anomia. Speech is clear with normal prosody and enunciation. Thought process is linear. Mood is normal and affect is normal.  Cranial nerves II - XII are as described above under HEENT exam. In addition: shoulder shrug is normal with equal shoulder height noted. Motor exam: Normal bulk, strength and tone is noted, with the exception of bilateral grip strength at 4 out of 5. There is no drift, or rebound. There is no resting tremor. On Archimedes spiral drawing he has mild tremulousness bilaterally, left more than right. Handwriting is not tremulous and legible, not micrographic. He has a mild postural and action tremor in  both upper extremities, left side slightly more pronounced than right. Tremor fairly fast and low amplitude. Fine motor skills are overall mildly impaired globally. Romberg is negative. Reflexes are 1+ in the upper extremities, trace in the knees bilaterally and absent in the ankles bilaterally.  Cerebellar testing: No dysmetria or intention tremor on finger to nose testing on the right, not possible on the left.  Sensory exam: intact to light touch in the upper and lower extremities.  Gait, station and balance: He stands with  mild difficulty and reports left knee pain. He walks with preserved arm swing on the right and mildly decreased arm swing on the left, but in keeping with decreased range of motion of the left shoulder, he turns slowly, posture is age-appropriate. Tandem walk is not possible for him.   Assessment and Plan:   In summary, Travis Ferguson is a very pleasant 76 y.o.-year old male with an underlying complicated medical history of COPD, on oxygen treatment at night, sarcoidosis, former smoking, mild obstructive sleep apnea, history of subdural hematoma post fall in 2010, requiring surgery, hypertension, reflux disease and hiatal hernia, diastolic dysfunction, degenerative neck disease and low back pain, arthritis, anxiety, depression, diastolic dysfunction, hyperlipidemia, status post lumbar laminectomy, history of traumatic rotator cuff tear on the left, status post left total shoulder replacement in April 2017, status post left ulnar nerve transposition in August 2017, who reports a tremor in his left hand for the past few months, noted first after his shoulder surgery. On examination he has a bilateral upper extremity tremor, with  tremor presentation not in keeping with parkinsonism, overall his exam is not telltale for parkinsonism and I reassured them in that regard. Tremor is in his case most likely due to a combination of factors including medication induced from his COPD inhalers,  being on prednisone, being on an antidepressant which can exacerbate tremors and also possibly exacerbated by stress/anxiety. I suggested watchful waiting and clinical observation, no new medication at  this time. I would like to proceed with a brain MRI without contrast for comparison. He had a previous MRI at the time of his head injury which showed the large subdural hematoma. We will call him with his MRI results. I suggested a six-month checkup, sooner as needed. I answered all his questions today and the patient was in agreement. Thank you very much for allowing me to participate in the care of this nice patient. If I can be of any further assistance to you please do not hesitate to call me at [phone].  Sincerely,   Star Age, MD, PhD

## 2016-07-12 ENCOUNTER — Ambulatory Visit (INDEPENDENT_AMBULATORY_CARE_PROVIDER_SITE_OTHER)
Admission: RE | Admit: 2016-07-12 | Discharge: 2016-07-12 | Disposition: A | Discharge status: Home / Self Care | Payer: Medicare Other | Source: Ambulatory Visit | Attending: Adult Health | Admitting: Adult Health

## 2016-07-12 DIAGNOSIS — J441 Chronic obstructive pulmonary disease with (acute) exacerbation: Secondary | ICD-10-CM | POA: Diagnosis not present

## 2016-07-12 DIAGNOSIS — J9611 Chronic respiratory failure with hypoxia: Secondary | ICD-10-CM

## 2016-07-18 ENCOUNTER — Telehealth: Payer: Self-pay | Admitting: Pulmonary Disease

## 2016-07-18 NOTE — Telephone Encounter (Signed)
Notes Recorded by Melvenia Needles, NP on 07/17/2016 at 1:23 PM EST Mod emphysema  Interstitial changes decreased. , stable changes on LLL,  Decreased nodularity on left .  overal stable  Discuss in detail on return  No changes , cont w/ ov recs       Called and spoke with pt and he is aware of ct results per TP.  Nothing further is needed.

## 2016-07-20 ENCOUNTER — Ambulatory Visit
Admission: RE | Admit: 2016-07-20 | Discharge: 2016-07-20 | Disposition: A | Discharge status: Home / Self Care | Payer: Medicare Other | Source: Ambulatory Visit | Attending: Neurology | Admitting: Neurology

## 2016-07-20 DIAGNOSIS — R251 Tremor, unspecified: Secondary | ICD-10-CM

## 2016-07-23 NOTE — Progress Notes (Signed)
Please call patient regarding the recent brain MRI: The brain scan showed a normal structure of the brain and mild to moderate volume loss which we call atrophy. There were changes in the deeper structures of the brain, which we call white matter changes or microvascular changes. These were reported as mild in His case. These are tiny white spots, that occur with time and are seen in a variety of conditions, including with normal aging, chronic hypertension, chronic headaches, especially migraine HAs, chronic diabetes, chronic hyperlipidemia. These are not strokes and no mass or lesion were seen which is reassuring.  No evidence of new hemorrhage, old subdural hemorrhage that was apparent on his MRI from 05/09/2009 has resolved, no evidence of recurrent bleed, postsurgical changes were evident from before.  Again, there were no acute findings, such as a stroke, or mass or blood products. No further action is required on this test at this time, other than re-enforcing the importance of good blood pressure control, good cholesterol control, good blood sugar control, and weight management. Please remind patient to keep any upcoming appointments or tests and to call us with any interim questions, concerns, problems or updates. Thanks,  Star Age, MD, PhD

## 2016-07-24 ENCOUNTER — Telehealth: Payer: Self-pay

## 2016-07-24 NOTE — Telephone Encounter (Signed)
LM for patient to call us back for results.

## 2016-07-24 NOTE — Telephone Encounter (Signed)
-----   Message from Star Age, MD sent at 07/23/2016  7:41 PM EST ----- Please call patient regarding the recent brain MRI: The brain scan showed a normal structure of the brain and mild to moderate volume loss which we call atrophy. There were changes in the deeper structures of the brain, which we call white matter changes or microvascular changes. These were reported as mild in His case. These are tiny white spots, that occur with time and are seen in a variety of conditions, including with normal aging, chronic hypertension, chronic headaches, especially migraine HAs, chronic diabetes, chronic hyperlipidemia. These are not strokes and no mass or lesion were seen which is reassuring.  No evidence of new hemorrhage, old subdural hemorrhage that was apparent on his MRI from 05/09/2009 has resolved, no evidence of recurrent bleed, postsurgical changes were evident from before.  Again, there were no acute findings, such as a stroke, or mass or blood products. No further action is required on this test at this time, other than re-enforcing the importance of good blood pressure control, good cholesterol control, good blood sugar control, and weight management. Please remind patient to keep any upcoming appointments or tests and to call us with any interim questions, concerns, problems or updates. Thanks,  Star Age, MD, PhD

## 2016-07-24 NOTE — Telephone Encounter (Signed)
I spoke to patient and he is aware of results and recommendations.

## 2016-09-03 ENCOUNTER — Encounter: Payer: Self-pay | Admitting: Pulmonary Disease

## 2016-09-03 ENCOUNTER — Ambulatory Visit (INDEPENDENT_AMBULATORY_CARE_PROVIDER_SITE_OTHER): Payer: Medicare Other | Admitting: Pulmonary Disease

## 2016-09-03 VITALS — BP 110/70 | HR 106 | Ht 71.0 in | Wt 259.6 lb

## 2016-09-03 DIAGNOSIS — D860 Sarcoidosis of lung: Secondary | ICD-10-CM

## 2016-09-03 DIAGNOSIS — J449 Chronic obstructive pulmonary disease, unspecified: Secondary | ICD-10-CM

## 2016-09-03 DIAGNOSIS — J9611 Chronic respiratory failure with hypoxia: Secondary | ICD-10-CM | POA: Diagnosis not present

## 2016-09-03 MED ORDER — BUDESONIDE-FORMOTEROL FUMARATE 160-4.5 MCG/ACT IN AERO: 2.0000 | INHALATION_SPRAY | Freq: Two times a day (BID) | RESPIRATORY_TRACT | 0 refills | Status: DC

## 2016-09-03 NOTE — Patient Instructions (Signed)
Follow up in 6 months 

## 2016-09-03 NOTE — Progress Notes (Signed)
Current Outpatient Prescriptions on File Prior to Visit  Medication Sig  . budesonide-formoterol (SYMBICORT) 160-4.5 MCG/ACT inhaler Inhale 2 puffs into the lungs 2 (two) times daily.  . DULoxetine (CYMBALTA) 30 MG capsule Take 30 mg by mouth daily.  [name] ENTRESTO 24-26 MG TAKE 1 TABLET BY MOUTH 2 (TWO) TIMES DAILY.  . finasteride (PROSCAR) 5 MG tablet Take 5 mg by mouth daily.   [name] FLUoxetine (PROZAC) 40 MG capsule Take 40 mg by mouth daily with breakfast.   . furosemide (LASIX) 20 MG tablet Take 1 tablet (20 mg total) by mouth daily.  [name] HYDROcodone-acetaminophen (NORCO/VICODIN) 5-325 MG tablet Take 1 tablet by mouth every 6 (six) hours as needed for moderate pain.  [name] ipratropium (ATROVENT) 0.02 % nebulizer solution Take 2.5 mLs (0.5 mg total) by nebulization 4 (four) times daily.  . metoprolol tartrate (LOPRESSOR) 25 MG tablet TAKE 1 TABLET (25 MG TOTAL) BY MOUTH 2 (TWO) TIMES DAILY.  . Multiple Vitamin (MULTIVITAMIN) tablet Take 1 tablet by mouth daily.  . OXYGEN Inhale 2 L/min into the lungs at bedtime.  [name] PROAIR HFA 108 (90 BASE) MCG/ACT inhaler 1-2 PUFFS EVERY 4-6 HOURS AS NEEDED (Patient taking differently: 1-2 PUFFS EVERY 4-6 HOURS AS NEEDED for shortness of breath)  . simvastatin (ZOCOR) 20 MG tablet Take 20 mg by mouth every evening.   . sodium chloride (OCEAN) 0.65 % SOLN nasal spray Place 1-2 sprays into both nostrils daily as needed for congestion.  [name] spironolactone (ALDACTONE) 25 MG tablet Take 1 tablet (25 mg total) by mouth daily.   No current facility-administered medications on file prior to visit.     Chief Complaint  Patient presents with  . Follow-up    Pt had HRCT 07/12/16. Refused daytime O2 at last OV. Still using O2 at night @ 2 liters. Pt states that his breathing has been okay since last seen. Taking all meds as directed. Pt states that he has applied for [name] extension.    Sleep tests PSG May 2008>> AHI 8   Pulmonary tests Spirometry 11/18/08>>FEV1  2.15 (61%), FEV1% 62  Bronchoscopy 07/28/12 >> focal granulomatous inflammation Spirometry 03/14/14 >> FEV1 2.10 (60%), FEV1% 57  Cardiac tests Echo 08/19/14 >> EF 50 to 55%, grade 1 DD Echo 04/19/15 >> EF 30 to 35% Echo 10/27/15 >> EF 35 to 40%, grade 1 DD  Chest imaging CT chest 05/19/12>>superior segment LLL infiltrate, b/l hilar and mediastinal LAN with calcification CT chest 07/13/12 >> atherosclerosis, Lt hilar adenopathy, LLL superior segment narrowing, mass like opacity superior segment LLL 5.4 x 1.9 cm, multiple nodular areas LLL, diffuse GGO LLL PET scan 07/22/12 >> mildly hypermetabolic mediastinal adenopathy (SUV 5.8), superior segment nodules hypermetabolic (SUV 42.5) CT chest 08/31/12 >> decreased size of lesions CT chest 12/02/12 >> borderline mediastinal/hilar LAN, nodular septal thickening b/l, no change superior segment LLL, centrilobular emphysema CT chest 03/19/13 >> ?slight increase in LLL consolidative change, borderline LAN CT chest 08/24/13 >> 1.9 cm Rt paratracheal LAN, calcified 2.1 cm subcarinal LAN, narrowing of mainstem bronchi, chronic infiltrate LLL CT chest 03/16/14 >> atherosclerosis, no change in mediastinal and b/l hilar LAN, no change in LLL mass like opacity, thickening of interstitium, scattered sub-pleural nodules, small Lt pleural effusion, gallstones CT chest 08/19/14 >> small b/l effusions, emphysema, no change LLL  Past medical history Depression, PSTD, Migraines, combined CHF, CAD, HTN, HLD, Back pain, SDH 2010, Pseudomonal PNA 2016  Past surgical history, Family history, Social history, Allergies reviewed  Vital signs BP  110/70 (BP Location: Left Arm, Cuff Size: Normal)   Pulse (!) 106   Ht '5\' 11"'$  (1.803 m)   Wt 259 lb 9.6 oz (117.8 kg)   SpO2 94%   BMI 36.21 kg/m   History of Present Illness: Travis Ferguson is a 76 y.o. male former smoker with COPD, and sarcoidosis.  He had CT chest in January.  Changes of sarcoid decreased.  No significant  progression of emphysema.  He uses symbicort.  Hasn't been using albuterol much.  Using oxygen at night.  Has occasional cough and clear sputum.  Main issues related to joint pains.  Physical Exam:  General - pleasant Eyes - wears glasses ENT - no sinus tenderness, no oral exudate, no LAN Cardiac - regular, no murmur Chest - no wheeze Back - no tenderness Abd - soft, non tender Ext - no edema Neuro - normal strength Skin - no rashes Psych - normal mood   Assessment/Plan:  COPD with chronic bronchitis. - continue symbicort and prn albuterol  Chronic respiratory failure with hypoxia. - Related to COPD, pulmonary sarcoidosis, presumed OSA - continue 2 liters oxygen at night  Pulmonary sarcoidosis. - stable to slightly improved on most recent imaging studies  Combined heart failure. - f/u with cardiology   Patient Instructions  Follow up in 6 months    Chesley Mires, MD [place] [facility] Pager:  [phone] 09/03/2016, 2:03 PM

## 2016-09-09 ENCOUNTER — Ambulatory Visit: Payer: Medicare Other | Admitting: Pulmonary Disease

## 2016-09-23 ENCOUNTER — Telehealth: Payer: Self-pay | Admitting: Pulmonary Disease

## 2016-09-23 DIAGNOSIS — J438 Other emphysema: Secondary | ICD-10-CM

## 2016-09-23 NOTE — Telephone Encounter (Signed)
Spoke with Travis Ferguson, pt is requesting a referral from [facility] to come back to [facility].   Dr Halford Chessman, please advise if okay to place order for [facility]. I could not remember if you wanted him to follow up with Cardiology prior to starting back with [facility] or if he was cleared to restart at this time. Thanks.

## 2016-09-23 NOTE — Telephone Encounter (Signed)
Travis Ferguson [phone]) from pulmonary rehab returned call.Travis Ferguson

## 2016-09-23 NOTE — Telephone Encounter (Signed)
Callback number listed below for pulm rehab is incorrect- number is [phone]. lmtcb X1 for Molly at pulm rehab.

## 2016-09-29 NOTE — Telephone Encounter (Signed)
Okay to send order for referral back to pulmonary rehab.

## 2016-09-30 NOTE — Telephone Encounter (Signed)
Called pulmonary rehab and spoke to [place]. Verified this is another regular pulmonary rehab referral and not for maintenance. She confirmed; order placed for referral. Molly verbalized understanding and denied any further questions or concerns at this time.

## 2016-10-14 ENCOUNTER — Encounter (HOSPITAL_COMMUNITY): Payer: Self-pay

## 2016-10-14 ENCOUNTER — Encounter (HOSPITAL_COMMUNITY)
Admission: RE | Admit: 2016-10-14 | Discharge: 2016-10-14 | Disposition: A | Discharge status: Home / Self Care | Payer: Medicare Other | Source: Ambulatory Visit | Attending: Pulmonary Disease | Admitting: Pulmonary Disease

## 2016-10-14 VITALS — BP 125/84 | HR 80 | Ht 72.0 in | Wt 261.5 lb

## 2016-10-14 DIAGNOSIS — J438 Other emphysema: Secondary | ICD-10-CM | POA: Insufficient documentation

## 2016-10-14 NOTE — Progress Notes (Signed)
Travis Ferguson 75 y.o. male Pulmonary Rehab Orientation Note Patient arrived today in Cardiac and Pulmonary Rehab for orientation to Pulmonary Rehab. He is well known to me and has attended pulmonary rehab and cardiac rehab in the past. He was transported from [employer] via wheel chair. He does not carry portable oxygen. Per pt, he uses 2 L of oxygen at night when going to sleep due to sleep apnea.  Color good, skin warm and dry. Patient is oriented to time and place. Patient's medical history, psychosocial health, and medications reviewed. Psychosocial assessment reveals pt lives with their spouse. Pt is currently retired as a IT trainer. Pt states he has no hobbies. Pt reports his stress level is high. Areas of stress/anxiety include Health. He does seem to concentrate on his health issues to the point of repeating facts and diagnoses that are in the past. Pt does not exhibit signs of depression.  PHQ2/9 score 0/0. Pt shows fair  coping skills with negative outlook .  Offered emotional support and reassurance. Will continue to monitor and evaluate progress toward psychosocial goal(s) of getting patient engaged in exercise to aid his energy level and keep him occupied. He has chronic pain in his left shoulder and left leg, is presently not taking codeine and is seeing a pain specialist.  Physical assessment reveals heart rate is normal, breath sounds clear to auscultation, no wheezes, rales, or rhonchi. Grip strength equal, strong. Distal pulses . Patient reports he does take medications as prescribed. Patient states he follows a Low Sodium diet. The patient reports no specific efforts to gain or lose weight.. Patient's weight will be monitored closely demonstration and practice of PLB using pulse oximeter. Patient able to return demonstration satisfactorily. Safety and hand hygiene in the exercise area reviewed with patient. Patient voices understanding of the information reviewed. Department expectations  discussed with patient and achievable goals were set. The patient shows enthusiasm about attending the program and we look forward to working with this nice gentleman. The patient is scheduled for a 6 min walk test on Thursday, October 17, 2016 @ 3:30 pm and to begin exercise on Thursday, Oct 24, 2016 in the 1:30 pm class.  1200-1330

## 2016-10-15 ENCOUNTER — Ambulatory Visit: Payer: Medicare Other | Admitting: Pulmonary Disease

## 2016-10-17 ENCOUNTER — Encounter (HOSPITAL_COMMUNITY)
Admission: RE | Admit: 2016-10-17 | Discharge: 2016-10-17 | Disposition: A | Discharge status: Home / Self Care | Payer: Medicare Other | Source: Ambulatory Visit | Attending: Pulmonary Disease | Admitting: Pulmonary Disease

## 2016-10-17 DIAGNOSIS — J438 Other emphysema: Secondary | ICD-10-CM

## 2016-10-18 NOTE — Progress Notes (Signed)
Pulmonary Individual Treatment Plan  Patient Details  Name: Travis Ferguson MRN: [id number] Date of Birth: 05-05-1941 Referring Provider:     Pulmonary Rehab Walk Test from 10/17/2016 in [place]  Referring Provider  Dr. Halford Chessman      Initial Encounter Date:    Pulmonary Rehab Walk Test from 10/17/2016 in [place]  Date  10/18/16  Referring Provider  Dr. Halford Chessman      Visit Diagnosis: Other emphysema ([place])  Patient's Home Medications on Admission:   Current Outpatient Prescriptions:  .  budesonide-formoterol (SYMBICORT) 160-4.5 MCG/ACT inhaler, Inhale 2 puffs into the lungs 2 (two) times daily., Disp: 1 Inhaler, Rfl: 0 .  budesonide-formoterol (SYMBICORT) 160-4.5 MCG/ACT inhaler, Inhale 2 puffs into the lungs 2 (two) times daily., Disp: 2 Inhaler, Rfl: 0 .  DULoxetine (CYMBALTA) 30 MG capsule, Take 30 mg by mouth daily., Disp: , Rfl:  .  ENTRESTO 24-26 MG, TAKE 1 TABLET BY MOUTH 2 (TWO) TIMES DAILY., Disp: 180 tablet, Rfl: 1 .  finasteride (PROSCAR) 5 MG tablet, Take 5 mg by mouth daily. , Disp: , Rfl:  .  FLUoxetine (PROZAC) 40 MG capsule, Take 40 mg by mouth daily with breakfast. , Disp: , Rfl:  .  furosemide (LASIX) 20 MG tablet, Take 1 tablet (20 mg total) by mouth daily., Disp: 30 tablet, Rfl: 4 .  HYDROcodone-acetaminophen (NORCO/VICODIN) 5-325 MG tablet, Take 1 tablet by mouth every 6 (six) hours as needed for moderate pain., Disp: , Rfl:  .  ipratropium (ATROVENT) 0.02 % nebulizer solution, Take 2.5 mLs (0.5 mg total) by nebulization 4 (four) times daily., Disp: 300 mL, Rfl: 12 .  metoprolol tartrate (LOPRESSOR) 25 MG tablet, TAKE 1 TABLET (25 MG TOTAL) BY MOUTH 2 (TWO) TIMES DAILY., Disp: 90 tablet, Rfl: 1 .  Multiple Vitamin (MULTIVITAMIN) tablet, Take 1 tablet by mouth daily., Disp: , Rfl:  .  OXYGEN, Inhale 2 L/min into the lungs at bedtime., Disp: , Rfl:  .  PROAIR HFA 108 (90 BASE) MCG/ACT inhaler, 1-2 PUFFS  EVERY 4-6 HOURS AS NEEDED (Patient taking differently: 1-2 PUFFS EVERY 4-6 HOURS AS NEEDED for shortness of breath), Disp: 8.5 Inhaler, Rfl: 1 .  simvastatin (ZOCOR) 20 MG tablet, Take 20 mg by mouth every evening. , Disp: , Rfl:  .  sodium chloride (OCEAN) 0.65 % SOLN nasal spray, Place 1-2 sprays into both nostrils daily as needed for congestion., Disp: , Rfl:  .  spironolactone (ALDACTONE) 25 MG tablet, Take 1 tablet (25 mg total) by mouth daily., Disp: 30 tablet, Rfl: 4  Past Medical History: Past Medical History:  Diagnosis Date  . Acute on chronic combined systolic and diastolic CHF (congestive heart failure) ([place]) 02/24/2015  . Anxiety   . Arthritis    "about q joint I've got" (09/22/2015)  . Atherosclerosis   . Calculus of gallbladder with acute cholecystitis and obstruction   . CAP (community acquired pneumonia) 02/24/2015  . Cervical disc disease   . Chronic lower back pain    chronic back pain/under pain management  . Colon polyps    adenomatous and hyperplastic  . Colon polyps 08/02/2014   Tubular adenoma x 3, Hyperplastic-1  . COPD (chronic obstructive pulmonary disease) ([place])    a. Former Airline pilot, also smoked a pipe.  . Coronary artery calcification seen on CAT scan   . Depression   . Diastolic dysfunction    grade 1 diastolic dysfunction on 2-D echo  . Dyslipidemia   .  Dyspnea    chronic  . Encounter for biliary drainage tube placement    remains with drainage bag to right side for  biliary drainage.  . Frequent urination   . Gallstones   . Gangrenous cholecystitis 08/17/2014  . GERD (gastroesophageal reflux disease)   . Hiatal hernia    sensation problem ("right lateral side hip to knee").  . History of blood transfusion "numerous"  . History of oxygen administration    @ 2 l/m nasally at bedtime.(09/22/2015)  . Hypertension   . Inguinal hernia    right side  . Left ventricular dysfunction    ejection fraction of 25-30% with regional wall motion  abnormalities.  . Migraine    "years since I've had one" (09/22/2015)  . OSA (obstructive sleep apnea)    does not use CPAP; "just oxygen" (09/22/2015)  . Pneumonia ~ 1958  . PTSD (post-traumatic stress disorder)   . Rhinitis   . Sarcoidosis   . Sensation problem    right side-lateral hip to knee" decreased sensation and tingling feeling" -has informed Dr. Joylene Draft, Dr. Henrene Pastor, Dr. Lucia Gaskins of this.  . Sepsis ([place])   . Sinus congestion    10-27-14 at present some issues-"not bad"  . Steroid-induced hyperglycemia 02/25/2015  . Subdural hematoma ([place]) 11/10   a. 2010 s/p surgery - diagnosed several weeks after a fall.    Tobacco Use: History  Smoking Status  . Former Smoker  . Packs/day: 0.00  . Years: 30.00  . Types: Pipe  . Quit date: 06/24/2008  Smokeless Tobacco  . Never Used    Labs: Recent Review Flowsheet Data    Labs for ITP Cardiac and Pulmonary Rehab Latest Ref Rng & Units 05/11/2009 08/19/2014 01/23/2015 01/23/2015 02/24/2015   PHART 7.350 - 7.450 7.375 7.373 7.409 - 7.295(L)   PCO2ART 35.0 - 45.0 mmHg 36.6 33.5(L) 34.6(L) - 44.0   HCO3 20.0 - 24.0 mEq/L 20.9 19.1(L) 21.9 23.7 20.8   TCO2 0 - 100 mmol/L 22.0 16.'9 23 25 '$ 18.7   ACIDBASEDEF 0.0 - 2.0 mmol/L 3.4(H) 4.8(H) 2.0 1.0 5.2(H)   O2SAT % 98.9 93.4 94.0 68.0 99.5      Capillary Blood Glucose: Lab Results  Component Value Date   GLUCAP 121 (H) 02/27/2015   GLUCAP 170 (H) 02/26/2015   GLUCAP 133 (H) 02/26/2015   GLUCAP 146 (H) 02/26/2015   GLUCAP 153 (H) 02/26/2015     ADL UCSD:   Pulmonary Function Assessment:     Pulmonary Function Assessment - 10/14/16 1245      Breath   Bilateral Breath Sounds Clear   Shortness of Breath No;Limiting activity      Exercise Target Goals: Date: 10/18/16  Exercise Program Goal: Individual exercise prescription set with THRR, safety & activity barriers. Participant demonstrates ability to understand and report RPE using BORG scale, to self-measure pulse accurately, and to  acknowledge the importance of the exercise prescription.  Exercise Prescription Goal: Starting with aerobic activity 30 plus minutes a day, 3 days per week for initial exercise prescription. Provide home exercise prescription and guidelines that participant acknowledges understanding prior to discharge.  Activity Barriers & Risk Stratification:   6 Minute Walk:     6 Minute Walk    Row Name 10/18/16 0822         6 Minute Walk   Phase Initial     Distance 1030 feet     Walk Time 6 minutes     # of Rest Breaks 0  MPH 1.95     METS 2.53     RPE 11     Perceived Dyspnea  1     Symptoms Yes (comment)     Comments Chronic pain     Resting HR 67 bpm     Resting BP 100/70     Max Ex. HR 109 bpm     Max Ex. BP 132/80       Interval HR   Baseline HR 67     1 Minute HR 105     2 Minute HR 88     3 Minute HR 107     4 Minute HR 89     5 Minute HR 101     6 Minute HR 109     2 Minute Post HR 100     Interval Heart Rate? Yes       Interval Oxygen   Interval Oxygen? Yes     Baseline Oxygen Saturation % 92 %     Baseline Liters of Oxygen 0 L     1 Minute Oxygen Saturation % 90 %     1 Minute Liters of Oxygen 0 L     2 Minute Oxygen Saturation % 89 %     2 Minute Liters of Oxygen 0 L     3 Minute Oxygen Saturation % 89 %     3 Minute Liters of Oxygen 0 L     4 Minute Oxygen Saturation % 89 %     4 Minute Liters of Oxygen 0 L     5 Minute Oxygen Saturation % 88 %     5 Minute Liters of Oxygen 0 L     6 Minute Oxygen Saturation % 89 %     6 Minute Liters of Oxygen 0 L     2 Minute Post Oxygen Saturation % 92 %     2 Minute Post Liters of Oxygen 0 L        Oxygen Initial Assessment:     Oxygen Initial Assessment - 10/14/16 1249      Home Oxygen   Home Oxygen Device Home Concentrator;E-Tanks   Sleep Oxygen Prescription Pulsed   Liters per minute 2   Home Exercise Oxygen Prescription None   Home at Rest Exercise Oxygen Prescription None   Compliance with Home  Oxygen Use Yes     Intervention   Short Term Goals To learn and exhibit compliance with exercise, home and travel O2 prescription      Oxygen Re-Evaluation:   Oxygen Discharge (Final Oxygen Re-Evaluation):   Initial Exercise Prescription:     Initial Exercise Prescription - 10/18/16 0800      Date of Initial Exercise RX and Referring Provider   Date 10/18/16   Referring Provider Dr. Halford Chessman     NuStep   Level 2   Minutes 34   METs 1.5     Track   Laps 5   Minutes 17     Prescription Details   Frequency (times per week) 2   Duration Progress to 45 minutes of aerobic exercise without signs/symptoms of physical distress     Intensity   THRR 40-80% of Max Heartrate 58-115   Ratings of Perceived Exertion 11-13   Perceived Dyspnea 0-4     Progression   Progression Continue progressive overload as per policy without signs/symptoms or physical distress.     Resistance Training   Training Prescription Yes   Weight orange bands  Reps 10-15      Perform Capillary Blood Glucose checks as needed.  Exercise Prescription Changes:   Exercise Comments:   Exercise Goals and Review:   Exercise Goals Re-Evaluation :   Discharge Exercise Prescription (Final Exercise Prescription Changes):   Nutrition:  Target Goals: Understanding of nutrition guidelines, daily intake of sodium '1500mg'$ , cholesterol '200mg'$ , calories 30% from fat and 7% or less from saturated fats, daily to have 5 or more servings of fruits and vegetables.  Biometrics:     Pre Biometrics - 10/14/16 1251      Pre Biometrics   Grip Strength 25 kg       Nutrition Therapy Plan and Nutrition Goals:   Nutrition Discharge: Rate Your Plate Scores:   Nutrition Goals Re-Evaluation:   Nutrition Goals Discharge (Final Nutrition Goals Re-Evaluation):   Psychosocial: Target Goals: Acknowledge presence or absence of significant depression and/or stress, maximize coping skills, provide positive  support system. Participant is able to verbalize types and ability to use techniques and skills needed for reducing stress and depression.  Initial Review & Psychosocial Screening:     Initial Psych Review & Screening - 10/14/16 1254      Initial Review   Current issues with None Identified     Family Dynamics   Good Support System? Yes     Barriers   Psychosocial barriers to participate in program There are no identifiable barriers or psychosocial needs.     Screening Interventions   Interventions Encouraged to exercise      Quality of Life Scores:   PHQ-9: Recent Review Flowsheet Data    Depression screen [facility] 2/9 10/14/2016 10/30/2015 06/12/2015 03/13/2015 12/23/2014   Decreased Interest 0 0 0 0 0   Down, Depressed, Hopeless 0 0 '3 1 1   '$ PHQ - 2 Score 0 0 '3 1 1   '$ Altered sleeping - - 3 - -   Tired, decreased energy - - 3 - -   Change in appetite - - 0 - -   Feeling bad or failure about yourself  - - 0 - -   Trouble concentrating - - 0 - -   Moving slowly or fidgety/restless - - 0 - -   Suicidal thoughts - - 0 - -   PHQ-9 Score - - 9 - -   Difficult doing work/chores - - Somewhat difficult - -     Interpretation of Total Score  Total Score Depression Severity:  1-4 = Minimal depression, 5-9 = Mild depression, 10-14 = Moderate depression, 15-19 = Moderately severe depression, 20-27 = Severe depression   Psychosocial Evaluation and Intervention:     Psychosocial Evaluation - 10/14/16 1255      Psychosocial Evaluation & Interventions   Interventions Encouraged to exercise with the program and follow exercise prescription   Continue Psychosocial Services  No Follow up required      Psychosocial Re-Evaluation:   Psychosocial Discharge (Final Psychosocial Re-Evaluation):   Education: Education Goals: Education classes will be provided on a weekly basis, covering required topics. Participant will state understanding/return demonstration of topics  presented.  Learning Barriers/Preferences:     Learning Barriers/Preferences - 10/14/16 1244      Learning Barriers/Preferences   Learning Barriers None   Learning Preferences Written Material;Video;Pictoral;Computer/Internet      Education Topics: Risk Factor Reduction:  -Group instruction that is supported by a PowerPoint presentation. Instructor discusses the definition of a risk factor, different risk factors for pulmonary disease, and how the heart and lungs  work together.     Nutrition for Pulmonary Patient:  -Group instruction provided by PowerPoint slides, verbal discussion, and written materials to support subject matter. The instructor gives an explanation and review of healthy diet recommendations, which includes a discussion on weight management, recommendations for fruit and vegetable consumption, as well as protein, fluid, caffeine, fiber, sodium, sugar, and alcohol. Tips for eating when patients are short of breath are discussed.   PULMONARY REHAB OTHER RESPIRATORY from 01/18/2016 in [place]  Date  11/23/15  Educator  RD  Instruction Review Code  2- meets goals/outcomes      Pursed Lip Breathing:  -Group instruction that is supported by demonstration and informational handouts. Instructor discusses the benefits of pursed lip and diaphragmatic breathing and detailed demonstration on how to preform both.     PULMONARY REHAB OTHER RESPIRATORY from 01/18/2016 in [place]  Date  01/18/16  Educator  EP  Instruction Review Code  R- Review/reinforce      Oxygen Safety:  -Group instruction provided by PowerPoint, verbal discussion, and written material to support subject matter. There is an overview of "What is Oxygen" and "Why do we need it".  Instructor also reviews how to create a safe environment for oxygen use, the importance of using oxygen as prescribed, and the risks of noncompliance. There is a  brief discussion on traveling with oxygen and resources the patient may utilize.   PULMONARY REHAB OTHER RESPIRATORY from 01/18/2016 in [place]  Date  12/07/15  Educator  RN  Instruction Review Code  2- meets goals/outcomes      Oxygen Equipment:  -Group instruction provided by [facility] Staff utilizing handouts, written materials, and equipment demonstrations.   Signs and Symptoms:  -Group instruction provided by written material and verbal discussion to support subject matter. Warning signs and symptoms of infection, stroke, and heart attack are reviewed and when to call the physician/911 reinforced. Tips for preventing the spread of infection discussed.   Advanced Directives:  -Group instruction provided by verbal instruction and written material to support subject matter. Instructor reviews Advanced Directive laws and proper instruction for filling out document.   Pulmonary Video:  -Group video education that reviews the importance of medication and oxygen compliance, exercise, good nutrition, pulmonary hygiene, and pursed lip and diaphragmatic breathing for the pulmonary patient.   PULMONARY REHAB OTHER RESPIRATORY from 01/18/2016 in [place]  Date  11/30/15  Instruction Review Code  2- meets goals/outcomes      Exercise for the Pulmonary Patient:  -Group instruction that is supported by a PowerPoint presentation. Instructor discusses benefits of exercise, core components of exercise, frequency, duration, and intensity of an exercise routine, importance of utilizing pulse oximetry during exercise, safety while exercising, and options of places to exercise outside of rehab.     PULMONARY REHAB OTHER RESPIRATORY from 01/18/2016 in [place]  Date  12/28/15  Educator  EP  Instruction Review Code  2- meets goals/outcomes      Pulmonary Medications:  -Verbally interactive group  education provided by instructor with focus on inhaled medications and proper administration.   Anatomy and Physiology of the Respiratory System and Intimacy:  -Group instruction provided by PowerPoint, verbal discussion, and written material to support subject matter. Instructor reviews respiratory cycle and anatomical components of the respiratory system and their functions. Instructor also reviews differences in obstructive and restrictive respiratory diseases  with examples of each. Intimacy, Sex, and Sexuality differences are reviewed with a discussion on how relationships can change when diagnosed with pulmonary disease. Common sexual concerns are reviewed.   PULMONARY REHAB OTHER RESPIRATORY from 01/18/2016 in [place]  Date  01/11/16  Educator  RN  Instruction Review Code  2- meets goals/outcomes      Knowledge Questionnaire Score:   Core Components/Risk Factors/Patient Goals at Admission:     Personal Goals and Risk Factors at Admission - 10/14/16 1253      Core Components/Risk Factors/Patient Goals on Admission   Improve shortness of breath with ADL's Yes   Intervention Provide education, individualized exercise plan and daily activity instruction to help decrease symptoms of SOB with activities of daily living.   Expected Outcomes Short Term: Achieves a reduction of symptoms when performing activities of daily living.   Develop more efficient breathing techniques such as purse lipped breathing and diaphragmatic breathing; and practicing self-pacing with activity Yes   Intervention Provide education, demonstration and support about specific breathing techniuqes utilized for more efficient breathing. Include techniques such as pursed lipped breathing, diaphragmatic breathing and self-pacing activity.   Expected Outcomes Short Term: Participant will be able to demonstrate and use breathing techniques as needed throughout daily activities.   Increase  knowledge of respiratory medications and ability to use respiratory devices properly  Yes   Intervention Provide education and demonstration as needed of appropriate use of medications, inhalers, and oxygen therapy.   Expected Outcomes Short Term: Achieves understanding of medications use. Understands that oxygen is a medication prescribed by physician. Demonstrates appropriate use of inhaler and oxygen therapy.      Core Components/Risk Factors/Patient Goals Review:    Core Components/Risk Factors/Patient Goals at Discharge (Final Review):    ITP Comments:   Comments:

## 2016-10-24 ENCOUNTER — Encounter (HOSPITAL_COMMUNITY)
Admission: RE | Admit: 2016-10-24 | Discharge: 2016-10-24 | Disposition: A | Discharge status: Home / Self Care | Payer: Medicare Other | Source: Ambulatory Visit | Attending: Pulmonary Disease | Admitting: Pulmonary Disease

## 2016-10-24 VITALS — Wt 262.8 lb

## 2016-10-24 DIAGNOSIS — J438 Other emphysema: Secondary | ICD-10-CM | POA: Diagnosis not present

## 2016-10-24 DIAGNOSIS — D869 Sarcoidosis, unspecified: Secondary | ICD-10-CM

## 2016-10-24 DIAGNOSIS — J9611 Chronic respiratory failure with hypoxia: Secondary | ICD-10-CM

## 2016-10-24 NOTE — Progress Notes (Signed)
Daily Session Note  Patient Details  Name: Travis Ferguson MRN: [id number] Date of Birth: [birth year] Referring Provider:     Pulmonary Rehab Walk Test from 10/17/2016 in [place]  Referring Provider  Dr. Halford Chessman      Encounter Date: 10/24/2016  Check In:     Session Check In - 10/24/16 1347      Check-In   Location [facility]   Staff Present Rosebud Poles, RN, BSN;Lisa Ysidro Evert, RN;Portia Rollene Rotunda, RN, BSN   Supervising physician immediately available to respond to emergencies [facility] immediately available   Physician(s) Dr. Sloan Leiter   Medication changes reported     No   Fall or balance concerns reported    No   Tobacco Cessation No Change   Warm-up and Cool-down Performed as group-led instruction   Resistance Training Performed Yes   VAD Patient? No     Pain Assessment   Currently in Pain? No/denies   Multiple Pain Sites No      Capillary Blood Glucose: No results found for this or any previous visit (from the past 24 hour(s)).      Exercise Prescription Changes - 10/24/16 1500      Response to Exercise   Blood Pressure (Admit) 96/60   Blood Pressure (Exercise) 120/70   Blood Pressure (Exit) 110/60   Heart Rate (Admit) 102 bpm   Heart Rate (Exercise) 109 bpm   Heart Rate (Exit) 105 bpm   Oxygen Saturation (Admit) 95 %   Oxygen Saturation (Exercise) 95 %   Oxygen Saturation (Exit) 95 %   Rating of Perceived Exertion (Exercise) 11   Perceived Dyspnea (Exercise) 2   Duration Progress to 45 minutes of aerobic exercise without signs/symptoms of physical distress     Progression   Progression Continue to progress workloads to maintain intensity without signs/symptoms of physical distress.     Resistance Training   Training Prescription Yes   Weight orange bands   Reps 10-15     NuStep   Level 2   Minutes 17   METs 1.5     Track   Laps 10   Minutes 17      History  Smoking Status  . Former Smoker   . Packs/day: 0.00  . Years: 30.00  . Types: Pipe  . Quit date: 06/24/2008  Smokeless Tobacco  . Never Used    Goals Met:  Exercise tolerated well Strength training completed today  Goals Unmet:  Not Applicable  Comments: Service time is from1330  to [place]    Dr. Rush Farmer is Medical Director for Pulmonary Rehab at [facility].

## 2016-10-29 ENCOUNTER — Encounter (HOSPITAL_COMMUNITY)
Admission: RE | Admit: 2016-10-29 | Discharge: 2016-10-29 | Disposition: A | Discharge status: Home / Self Care | Payer: Medicare Other | Source: Ambulatory Visit | Attending: Pulmonary Disease | Admitting: Pulmonary Disease

## 2016-10-29 VITALS — Wt 261.7 lb

## 2016-10-29 DIAGNOSIS — J438 Other emphysema: Secondary | ICD-10-CM | POA: Diagnosis not present

## 2016-10-29 DIAGNOSIS — J9611 Chronic respiratory failure with hypoxia: Secondary | ICD-10-CM

## 2016-10-29 NOTE — Progress Notes (Signed)
Pulmonary Individual Treatment Plan  Patient Details  Name: Travis Ferguson MRN: [id number] Date of Birth: 05-22-1941 Referring Provider:     Pulmonary Rehab Walk Test from 10/17/2016 in [place]  Referring Provider  Dr. Halford Chessman      Initial Encounter Date:    Pulmonary Rehab Walk Test from 10/17/2016 in [facility]  Date  10/18/16  Referring Provider  Dr. Halford Chessman      Visit Diagnosis: Chronic respiratory failure with hypoxia ([place])  Other emphysema ([place])  Patient's Home Medications on Admission:   Current Outpatient Prescriptions:  .  budesonide-formoterol (SYMBICORT) 160-4.5 MCG/ACT inhaler, Inhale 2 puffs into the lungs 2 (two) times daily., Disp: 1 Inhaler, Rfl: 0 .  budesonide-formoterol (SYMBICORT) 160-4.5 MCG/ACT inhaler, Inhale 2 puffs into the lungs 2 (two) times daily., Disp: 2 Inhaler, Rfl: 0 .  DULoxetine (CYMBALTA) 30 MG capsule, Take 30 mg by mouth daily., Disp: , Rfl:  .  ENTRESTO 24-26 MG, TAKE 1 TABLET BY MOUTH 2 (TWO) TIMES DAILY., Disp: 180 tablet, Rfl: 1 .  finasteride (PROSCAR) 5 MG tablet, Take 5 mg by mouth daily. , Disp: , Rfl:  .  FLUoxetine (PROZAC) 40 MG capsule, Take 40 mg by mouth daily with breakfast. , Disp: , Rfl:  .  furosemide (LASIX) 20 MG tablet, Take 1 tablet (20 mg total) by mouth daily., Disp: 30 tablet, Rfl: 4 .  HYDROcodone-acetaminophen (NORCO/VICODIN) 5-325 MG tablet, Take 1 tablet by mouth every 6 (six) hours as needed for moderate pain., Disp: , Rfl:  .  ipratropium (ATROVENT) 0.02 % nebulizer solution, Take 2.5 mLs (0.5 mg total) by nebulization 4 (four) times daily., Disp: 300 mL, Rfl: 12 .  metoprolol tartrate (LOPRESSOR) 25 MG tablet, TAKE 1 TABLET (25 MG TOTAL) BY MOUTH 2 (TWO) TIMES DAILY., Disp: 90 tablet, Rfl: 1 .  Multiple Vitamin (MULTIVITAMIN) tablet, Take 1 tablet by mouth daily., Disp: , Rfl:  .  OXYGEN, Inhale 2 L/min into the lungs at bedtime., Disp: , Rfl:  .  PROAIR  HFA 108 (90 BASE) MCG/ACT inhaler, 1-2 PUFFS EVERY 4-6 HOURS AS NEEDED (Patient taking differently: 1-2 PUFFS EVERY 4-6 HOURS AS NEEDED for shortness of breath), Disp: 8.5 Inhaler, Rfl: 1 .  simvastatin (ZOCOR) 20 MG tablet, Take 20 mg by mouth every evening. , Disp: , Rfl:  .  sodium chloride (OCEAN) 0.65 % SOLN nasal spray, Place 1-2 sprays into both nostrils daily as needed for congestion., Disp: , Rfl:  .  spironolactone (ALDACTONE) 25 MG tablet, Take 1 tablet (25 mg total) by mouth daily., Disp: 30 tablet, Rfl: 4  Past Medical History: Past Medical History:  Diagnosis Date  . Acute on chronic combined systolic and diastolic CHF (congestive heart failure) ([place]) 02/24/2015  . Anxiety   . Arthritis    "about q joint I've got" (09/22/2015)  . Atherosclerosis   . Calculus of gallbladder with acute cholecystitis and obstruction   . CAP (community acquired pneumonia) 02/24/2015  . Cervical disc disease   . Chronic lower back pain    chronic back pain/under pain management  . Colon polyps    adenomatous and hyperplastic  . Colon polyps 08/02/2014   Tubular adenoma x 3, Hyperplastic-1  . COPD (chronic obstructive pulmonary disease) ([place])    a. Former Air[employer] pilot, also smoked a pipe.  . Coronary artery calcification seen on CAT scan   . Depression   . Diastolic dysfunction    grade 1 diastolic dysfunction  on 2-D echo  . Dyslipidemia   . Dyspnea    chronic  . Encounter for biliary drainage tube placement    remains with drainage bag to right side for  biliary drainage.  . Frequent urination   . Gallstones   . Gangrenous cholecystitis 08/17/2014  . GERD (gastroesophageal reflux disease)   . Hiatal hernia    sensation problem ("right lateral side hip to knee").  . History of blood transfusion "numerous"  . History of oxygen administration    @ 2 l/m nasally at bedtime.(09/22/2015)  . Hypertension   . Inguinal hernia    right side  . Left ventricular dysfunction    ejection fraction of  25-30% with regional wall motion abnormalities.  . Migraine    "years since I've had one" (09/22/2015)  . OSA (obstructive sleep apnea)    does not use CPAP; "just oxygen" (09/22/2015)  . Pneumonia ~ 1958  . PTSD (post-traumatic stress disorder)   . Rhinitis   . Sarcoidosis   . Sensation problem    right side-lateral hip to knee" decreased sensation and tingling feeling" -has informed Dr. Joylene Draft, Dr. Henrene Pastor, Dr. Lucia Gaskins of this.  . Sepsis ([place])   . Sinus congestion    10-27-14 at present some issues-"not bad"  . Steroid-induced hyperglycemia 02/25/2015  . Subdural hematoma ([place]) 11/10   a. 2010 s/p surgery - diagnosed several weeks after a fall.    Tobacco Use: History  Smoking Status  . Former Smoker  . Packs/day: 0.00  . Years: 30.00  . Types: Pipe  . Quit date: 06/24/2008  Smokeless Tobacco  . Never Used    Labs: Recent Review Flowsheet Data    Labs for ITP Cardiac and Pulmonary Rehab Latest Ref Rng & Units 05/11/2009 08/19/2014 01/23/2015 01/23/2015 02/24/2015   PHART 7.350 - 7.450 7.375 7.373 7.409 - 7.295(L)   PCO2ART 35.0 - 45.0 mmHg 36.6 33.5(L) 34.6(L) - 44.0   HCO3 20.0 - 24.0 mEq/L 20.9 19.1(L) 21.9 23.7 20.8   TCO2 0 - 100 mmol/L 22.0 16.'9 23 25 '$ 18.7   ACIDBASEDEF 0.0 - 2.0 mmol/L 3.4(H) 4.8(H) 2.0 1.0 5.2(H)   O2SAT % 98.9 93.4 94.0 68.0 99.5      Capillary Blood Glucose: Lab Results  Component Value Date   GLUCAP 121 (H) 02/27/2015   GLUCAP 170 (H) 02/26/2015   GLUCAP 133 (H) 02/26/2015   GLUCAP 146 (H) 02/26/2015   GLUCAP 153 (H) 02/26/2015     ADL UCSD:   Pulmonary Function Assessment:     Pulmonary Function Assessment - 10/14/16 1245      Breath   Bilateral Breath Sounds Clear   Shortness of Breath No;Limiting activity      Exercise Target Goals:    Exercise Program Goal: Individual exercise prescription set with THRR, safety & activity barriers. Participant demonstrates ability to understand and report RPE using BORG scale, to self-measure  pulse accurately, and to acknowledge the importance of the exercise prescription.  Exercise Prescription Goal: Starting with aerobic activity 30 plus minutes a day, 3 days per week for initial exercise prescription. Provide home exercise prescription and guidelines that participant acknowledges understanding prior to discharge.  Activity Barriers & Risk Stratification:   6 Minute Walk:     6 Minute Walk    Row Name 10/18/16 0822         6 Minute Walk   Phase Initial     Distance 1030 feet     Walk Time 6 minutes     #  of Rest Breaks 0     MPH 1.95     METS 2.53     RPE 11     Perceived Dyspnea  1     Symptoms Yes (comment)     Comments Chronic pain     Resting HR 67 bpm     Resting BP 100/70     Max Ex. HR 109 bpm     Max Ex. BP 132/80       Interval HR   Baseline HR 67     1 Minute HR 105     2 Minute HR 88     3 Minute HR 107     4 Minute HR 89     5 Minute HR 101     6 Minute HR 109     2 Minute Post HR 100     Interval Heart Rate? Yes       Interval Oxygen   Interval Oxygen? Yes     Baseline Oxygen Saturation % 92 %     Baseline Liters of Oxygen 0 L     1 Minute Oxygen Saturation % 90 %     1 Minute Liters of Oxygen 0 L     2 Minute Oxygen Saturation % 89 %     2 Minute Liters of Oxygen 0 L     3 Minute Oxygen Saturation % 89 %     3 Minute Liters of Oxygen 0 L     4 Minute Oxygen Saturation % 89 %     4 Minute Liters of Oxygen 0 L     5 Minute Oxygen Saturation % 88 %     5 Minute Liters of Oxygen 0 L     6 Minute Oxygen Saturation % 89 %     6 Minute Liters of Oxygen 0 L     2 Minute Post Oxygen Saturation % 92 %     2 Minute Post Liters of Oxygen 0 L        Oxygen Initial Assessment:     Oxygen Initial Assessment - 10/18/16 1114      Initial 6 min Walk   Oxygen Used None   Resting Oxygen Saturation  during 6 min walk 92 %   Exercise Oxygen Saturation  during 6 min walk 88 %      Oxygen Re-Evaluation:     Oxygen Re-Evaluation     Row Name 10/29/16 0914 10/29/16 0915           Program Oxygen Prescription   Program Oxygen Prescription None  -        Home Oxygen   Home Oxygen Device  - Home Concentrator;E-Tanks      Sleep Oxygen Prescription  - Continuous      Liters per minute  - 2      Home Exercise Oxygen Prescription  - None      Home at Rest Exercise Oxygen Prescription  - None      Compliance with Home Oxygen Use  - Yes        Goals/Expected Outcomes   Long  Term Goals  - Exhibits compliance with exercise, home and travel O2 prescription      Comments  - follows home oxygen when sleeping at night      Goals/Expected Outcomes  - continued compliance         Oxygen Discharge (Final Oxygen Re-Evaluation):     Oxygen Re-Evaluation - 10/29/16 0915  Home Oxygen   Home Oxygen Device Home Concentrator;E-Tanks   Sleep Oxygen Prescription Continuous   Liters per minute 2   Home Exercise Oxygen Prescription None   Home at Rest Exercise Oxygen Prescription None   Compliance with Home Oxygen Use Yes     Goals/Expected Outcomes   Long  Term Goals Exhibits compliance with exercise, home and travel O2 prescription   Comments follows home oxygen when sleeping at night   Goals/Expected Outcomes continued compliance      Initial Exercise Prescription:     Initial Exercise Prescription - 10/18/16 0800      Date of Initial Exercise RX and Referring Provider   Date 10/18/16   Referring Provider Dr. Halford Chessman     NuStep   Level 2   Minutes 34   METs 1.5     Track   Laps 5   Minutes 17     Prescription Details   Frequency (times per week) 2   Duration Progress to 45 minutes of aerobic exercise without signs/symptoms of physical distress     Intensity   THRR 40-80% of Max Heartrate 58-115   Ratings of Perceived Exertion 11-13   Perceived Dyspnea 0-4     Progression   Progression Continue progressive overload as per policy without signs/symptoms or physical distress.     Resistance Training    Training Prescription Yes   Weight orange bands   Reps 10-15      Perform Capillary Blood Glucose checks as needed.  Exercise Prescription Changes:     Exercise Prescription Changes    Row Name 10/24/16 1500             Response to Exercise   Blood Pressure (Admit) 96/60       Blood Pressure (Exercise) 120/70       Blood Pressure (Exit) 110/60       Heart Rate (Admit) 102 bpm       Heart Rate (Exercise) 109 bpm       Heart Rate (Exit) 105 bpm       Oxygen Saturation (Admit) 95 %       Oxygen Saturation (Exercise) 95 %       Oxygen Saturation (Exit) 95 %       Rating of Perceived Exertion (Exercise) 11       Perceived Dyspnea (Exercise) 2       Duration Progress to 45 minutes of aerobic exercise without signs/symptoms of physical distress         Progression   Progression Continue to progress workloads to maintain intensity without signs/symptoms of physical distress.         Resistance Training   Training Prescription Yes       Weight orange bands       Reps 10-15         NuStep   Level 2       Minutes 17       METs 1.5         Track   Laps 10       Minutes 17          Exercise Comments:   Exercise Goals and Review:   Exercise Goals Re-Evaluation :     Exercise Goals Re-Evaluation    Row Name 10/28/16 1259             Exercise Goal Re-Evaluation   Exercise Goals Review Increase Physical Activity;Increase Strenth and Stamina       Comments  Patient has only attended one exercise session. Will cont. to monitor and progress as able.        Expected Outcomes Through exercising at rehab and at home, the patient will be able to increase strength and stamina which will make ADL's easier to preform.          Discharge Exercise Prescription (Final Exercise Prescription Changes):     Exercise Prescription Changes - 10/24/16 1500      Response to Exercise   Blood Pressure (Admit) 96/60   Blood Pressure (Exercise) 120/70   Blood Pressure (Exit)  110/60   Heart Rate (Admit) 102 bpm   Heart Rate (Exercise) 109 bpm   Heart Rate (Exit) 105 bpm   Oxygen Saturation (Admit) 95 %   Oxygen Saturation (Exercise) 95 %   Oxygen Saturation (Exit) 95 %   Rating of Perceived Exertion (Exercise) 11   Perceived Dyspnea (Exercise) 2   Duration Progress to 45 minutes of aerobic exercise without signs/symptoms of physical distress     Progression   Progression Continue to progress workloads to maintain intensity without signs/symptoms of physical distress.     Resistance Training   Training Prescription Yes   Weight orange bands   Reps 10-15     NuStep   Level 2   Minutes 17   METs 1.5     Track   Laps 10   Minutes 17      Nutrition:  Target Goals: Understanding of nutrition guidelines, daily intake of sodium '1500mg'$ , cholesterol '200mg'$ , calories 30% from fat and 7% or less from saturated fats, daily to have 5 or more servings of fruits and vegetables.  Biometrics:     Pre Biometrics - 10/14/16 1251      Pre Biometrics   Grip Strength 25 kg       Nutrition Therapy Plan and Nutrition Goals:   Nutrition Discharge: Rate Your Plate Scores:   Nutrition Goals Re-Evaluation:   Nutrition Goals Discharge (Final Nutrition Goals Re-Evaluation):   Psychosocial: Target Goals: Acknowledge presence or absence of significant depression and/or stress, maximize coping skills, provide positive support system. Participant is able to verbalize types and ability to use techniques and skills needed for reducing stress and depression.  Initial Review & Psychosocial Screening:     Initial Psych Review & Screening - 10/14/16 1254      Initial Review   Current issues with None Identified     Family Dynamics   Good Support System? Yes     Barriers   Psychosocial barriers to participate in program There are no identifiable barriers or psychosocial needs.     Screening Interventions   Interventions Encouraged to exercise       Quality of Life Scores:   PHQ-9: Recent Review Flowsheet Data    Depression screen [facility] 2/9 10/14/2016 10/30/2015 06/12/2015 03/13/2015 12/23/2014   Decreased Interest 0 0 0 0 0   Down, Depressed, Hopeless 0 0 '3 1 1   '$ PHQ - 2 Score 0 0 '3 1 1   '$ Altered sleeping - - 3 - -   Tired, decreased energy - - 3 - -   Change in appetite - - 0 - -   Feeling bad or failure about yourself  - - 0 - -   Trouble concentrating - - 0 - -   Moving slowly or fidgety/restless - - 0 - -   Suicidal thoughts - - 0 - -   PHQ-9 Score - - 9 - -  Difficult doing work/chores - - Somewhat difficult - -     Interpretation of Total Score  Total Score Depression Severity:  1-4 = Minimal depression, 5-9 = Mild depression, 10-14 = Moderate depression, 15-19 = Moderately severe depression, 20-27 = Severe depression   Psychosocial Evaluation and Intervention:     Psychosocial Evaluation - 10/14/16 1255      Psychosocial Evaluation & Interventions   Interventions Encouraged to exercise with the program and follow exercise prescription   Continue Psychosocial Services  No Follow up required      Psychosocial Re-Evaluation:     Psychosocial Re-Evaluation    [place] Name 10/29/16 0918             Psychosocial Re-Evaluation   Current issues with None Identified       Interventions Encouraged to attend Pulmonary Rehabilitation for the exercise       Continue Psychosocial Services  No Follow up required          Psychosocial Discharge (Final Psychosocial Re-Evaluation):     Psychosocial Re-Evaluation - 10/29/16 0918      Psychosocial Re-Evaluation   Current issues with None Identified   Interventions Encouraged to attend Pulmonary Rehabilitation for the exercise   Continue Psychosocial Services  No Follow up required      Education: Education Goals: Education classes will be provided on a weekly basis, covering required topics. Participant will state understanding/return demonstration of topics  presented.  Learning Barriers/Preferences:     Learning Barriers/Preferences - 10/14/16 1244      Learning Barriers/Preferences   Learning Barriers None   Learning Preferences Written Material;Video;Pictoral;Computer/Internet      Education Topics: Risk Factor Reduction:  -Group instruction that is supported by a PowerPoint presentation. Instructor discusses the definition of a risk factor, different risk factors for pulmonary disease, and how the heart and lungs work together.     Nutrition for Pulmonary Patient:  -Group instruction provided by PowerPoint slides, verbal discussion, and written materials to support subject matter. The instructor gives an explanation and review of healthy diet recommendations, which includes a discussion on weight management, recommendations for fruit and vegetable consumption, as well as protein, fluid, caffeine, fiber, sodium, sugar, and alcohol. Tips for eating when patients are short of breath are discussed.   PULMONARY REHAB OTHER RESPIRATORY from 01/18/2016 in [facility]  Date  11/23/15  Educator  RD  Instruction Review Code  2- meets goals/outcomes      Pursed Lip Breathing:  -Group instruction that is supported by demonstration and informational handouts. Instructor discusses the benefits of pursed lip and diaphragmatic breathing and detailed demonstration on how to preform both.     PULMONARY REHAB OTHER RESPIRATORY from 01/18/2016 in [place]  Date  01/18/16  Educator  EP  Instruction Review Code  R- Review/reinforce      Oxygen Safety:  -Group instruction provided by PowerPoint, verbal discussion, and written material to support subject matter. There is an overview of "What is Oxygen" and "Why do we need it".  Instructor also reviews how to create a safe environment for oxygen use, the importance of using oxygen as prescribed, and the risks of noncompliance. There is a  brief discussion on traveling with oxygen and resources the patient may utilize.   PULMONARY REHAB OTHER RESPIRATORY from 01/18/2016 in [place]  Date  12/07/15  Educator  RN  Instruction Review Code  2- meets goals/outcomes  Oxygen Equipment:  -Group instruction provided by [facility] Staff utilizing handouts, written materials, and equipment demonstrations.   Signs and Symptoms:  -Group instruction provided by written material and verbal discussion to support subject matter. Warning signs and symptoms of infection, stroke, and heart attack are reviewed and when to call the physician/911 reinforced. Tips for preventing the spread of infection discussed.   PULMONARY REHAB OTHER RESPIRATORY from 10/24/2016 in [place]  Date  10/24/16  Educator  RN  Instruction Review Code  2- meets goals/outcomes      Advanced Directives:  -Group instruction provided by verbal instruction and written material to support subject matter. Instructor reviews Advanced Directive laws and proper instruction for filling out document.   Pulmonary Video:  -Group video education that reviews the importance of medication and oxygen compliance, exercise, good nutrition, pulmonary hygiene, and pursed lip and diaphragmatic breathing for the pulmonary patient.   PULMONARY REHAB OTHER RESPIRATORY from 01/18/2016 in [place]  Date  11/30/15  Instruction Review Code  2- meets goals/outcomes      Exercise for the Pulmonary Patient:  -Group instruction that is supported by a PowerPoint presentation. Instructor discusses benefits of exercise, core components of exercise, frequency, duration, and intensity of an exercise routine, importance of utilizing pulse oximetry during exercise, safety while exercising, and options of places to exercise outside of rehab.     PULMONARY REHAB OTHER RESPIRATORY from 01/18/2016 in [place]  Date  12/28/15  Educator  EP  Instruction Review Code  2- meets goals/outcomes      Pulmonary Medications:  -Verbally interactive group education provided by instructor with focus on inhaled medications and proper administration.   Anatomy and Physiology of the Respiratory System and Intimacy:  -Group instruction provided by PowerPoint, verbal discussion, and written material to support subject matter. Instructor reviews respiratory cycle and anatomical components of the respiratory system and their functions. Instructor also reviews differences in obstructive and restrictive respiratory diseases with examples of each. Intimacy, Sex, and Sexuality differences are reviewed with a discussion on how relationships can change when diagnosed with pulmonary disease. Common sexual concerns are reviewed.   PULMONARY REHAB OTHER RESPIRATORY from 01/18/2016 in [place]  Date  01/11/16  Educator  RN  Instruction Review Code  2- meets goals/outcomes      Knowledge Questionnaire Score:   Core Components/Risk Factors/Patient Goals at Admission:     Personal Goals and Risk Factors at Admission - 10/14/16 1253      Core Components/Risk Factors/Patient Goals on Admission   Improve shortness of breath with ADL's Yes   Intervention Provide education, individualized exercise plan and daily activity instruction to help decrease symptoms of SOB with activities of daily living.   Expected Outcomes Short Term: Achieves a reduction of symptoms when performing activities of daily living.   Develop more efficient breathing techniques such as purse lipped breathing and diaphragmatic breathing; and practicing self-pacing with activity Yes   Intervention Provide education, demonstration and support about specific breathing techniuqes utilized for more efficient breathing. Include techniques such as pursed lipped breathing, diaphragmatic  breathing and self-pacing activity.   Expected Outcomes Short Term: Participant will be able to demonstrate and use breathing techniques as needed throughout daily activities.   Increase knowledge of respiratory medications and ability to use respiratory devices properly  Yes   Intervention Provide education and demonstration as needed of appropriate use of  medications, inhalers, and oxygen therapy.   Expected Outcomes Short Term: Achieves understanding of medications use. Understands that oxygen is a medication prescribed by physician. Demonstrates appropriate use of inhaler and oxygen therapy.      Core Components/Risk Factors/Patient Goals Review:      Goals and Risk Factor Review    Row Name 10/29/16 0917             Core Components/Risk Factors/Patient Goals Review   Personal Goals Review Develop more efficient breathing techniques such as purse lipped breathing and diaphragmatic breathing and practicing self-pacing with activity.;Increase knowledge of respiratory medications and ability to use respiratory devices properly.;Improve shortness of breath with ADL's       Review has only attended 1 exercise session, too early to see progression to goals       Expected Outcomes should see improvement in the next full 30 days          Core Components/Risk Factors/Patient Goals at Discharge (Final Review):      Goals and Risk Factor Review - 10/29/16 0917      Core Components/Risk Factors/Patient Goals Review   Personal Goals Review Develop more efficient breathing techniques such as purse lipped breathing and diaphragmatic breathing and practicing self-pacing with activity.;Increase knowledge of respiratory medications and ability to use respiratory devices properly.;Improve shortness of breath with ADL's   Review has only attended 1 exercise session, too early to see progression to goals   Expected Outcomes should see improvement in the next full 30 days      ITP  Comments:   Comments:ITP REVIEW Pt is making expected progress toward pulmonary rehab goals after completing 1 sessions. Recommend continued exercise, life style modification, education, and utilization of breathing techniques to increase stamina and strength and decrease shortness of breath with exertion.

## 2016-10-29 NOTE — Progress Notes (Signed)
Daily Session Note  Patient Details  Name: Travis Ferguson MRN: [id number] Date of Birth: May 04, 1941 Referring Provider:     Pulmonary Rehab Walk Test from 10/17/2016 in [place]  Referring Provider  Dr. Halford Chessman      Encounter Date: 10/29/2016  Check In:     Session Check In - 10/29/16 1331      Check-In   Location [facility]   Staff Present Trish Fountain, RN, BSN;Molly diVincenzo, MS, ACSM RCEP, Exercise Physiologist;Lisa Ysidro Evert, RN;Joan Leonia Reeves, RN, BSN   Supervising physician immediately available to respond to emergencies [facility] immediately available   Physician(s) Dr. Almetta Lovely   Medication changes reported     No   Fall or balance concerns reported    No   Tobacco Cessation No Change   Warm-up and Cool-down Performed as group-led instruction   Resistance Training Performed Yes   VAD Patient? No     Pain Assessment   Currently in Pain? No/denies   Multiple Pain Sites No      Capillary Blood Glucose: No results found for this or any previous visit (from the past 24 hour(s)).      Exercise Prescription Changes - 10/29/16 1602      Response to Exercise   Blood Pressure (Admit) 108/64   Blood Pressure (Exercise) 124/70   Blood Pressure (Exit) 112/60   Heart Rate (Admit) 91 bpm   Heart Rate (Exercise) 111 bpm   Heart Rate (Exit) 93 bpm   Oxygen Saturation (Admit) 96 %   Oxygen Saturation (Exercise) 91 %   Oxygen Saturation (Exit) 95 %   Rating of Perceived Exertion (Exercise) 11   Perceived Dyspnea (Exercise) 0   Duration Progress to 45 minutes of aerobic exercise without signs/symptoms of physical distress     Progression   Progression Continue to progress workloads to maintain intensity without signs/symptoms of physical distress.     Resistance Training   Training Prescription Yes   Weight orange bands   Reps 10-15     NuStep   Level 3   Minutes 17   METs 1.7     Track   Laps 10   Minutes  17      History  Smoking Status  . Former Smoker  . Packs/day: 0.00  . Years: 30.00  . Types: Pipe  . Quit date: 06/24/2008  Smokeless Tobacco  . Never Used    Goals Met:  Exercise tolerated well Queuing for purse lip breathing No report of cardiac concerns or symptoms Strength training completed today  Goals Unmet:  Not Applicable  Comments: Service time is from 1330 to 1515   Dr. Rush Farmer is Medical Director for Pulmonary Rehab at [facility].

## 2016-10-31 ENCOUNTER — Encounter (HOSPITAL_COMMUNITY): Payer: Medicare Other

## 2016-11-05 ENCOUNTER — Encounter (HOSPITAL_COMMUNITY)
Admission: RE | Admit: 2016-11-05 | Discharge: 2016-11-05 | Disposition: A | Discharge status: Home / Self Care | Payer: Medicare Other | Source: Ambulatory Visit | Attending: Pulmonary Disease | Admitting: Pulmonary Disease

## 2016-11-05 VITALS — Wt 263.2 lb

## 2016-11-05 DIAGNOSIS — J9611 Chronic respiratory failure with hypoxia: Secondary | ICD-10-CM

## 2016-11-05 DIAGNOSIS — J438 Other emphysema: Secondary | ICD-10-CM

## 2016-11-05 DIAGNOSIS — D869 Sarcoidosis, unspecified: Secondary | ICD-10-CM

## 2016-11-05 NOTE — Progress Notes (Signed)
Daily Session Note  Patient Details  Name: Travis Ferguson MRN: [id number] Date of Birth: 05/23/1941 Referring Provider:     Pulmonary Rehab Walk Test from 10/17/2016 in [facility]  Referring Provider  Dr. Sood      Encounter Date: 11/05/2016  Check In:     Session Check In - 11/05/16 1509      Check-In   Medication changes reported     No   Fall or balance concerns reported    No   Tobacco Cessation No Change   Warm-up and Cool-down Performed as group-led instruction   Resistance Training Performed Yes   VAD Patient? No     Pain Assessment   Currently in Pain? No/denies   Multiple Pain Sites No      Capillary Blood Glucose: No results found for this or any previous visit (from the past 24 hour(s)).      Exercise Prescription Changes - 11/05/16 1513      Response to Exercise   Blood Pressure (Admit) 102/70   Blood Pressure (Exercise) 120/76   Blood Pressure (Exit) 112/72   Heart Rate (Admit) 91 bpm   Heart Rate (Exercise) 91 bpm   Heart Rate (Exit) 87 bpm   Oxygen Saturation (Admit) 96 %   Oxygen Saturation (Exercise) 93 %   Oxygen Saturation (Exit) 96 %   Rating of Perceived Exertion (Exercise) 11   Perceived Dyspnea (Exercise) 0   Duration Progress to 45 minutes of aerobic exercise without signs/symptoms of physical distress     Progression   Progression Continue to progress workloads to maintain intensity without signs/symptoms of physical distress.     Resistance Training   Training Prescription Yes   Weight orange bands   Reps 10-15     NuStep   Level 3   Minutes 34   METs 1.5     Track   Laps 9   Minutes 17      History  Smoking Status  . Former Smoker  . Packs/day: 0.00  . Years: 30.00  . Types: Pipe  . Quit date: 06/24/2008  Smokeless Tobacco  . Never Used    Goals Met:  Improved SOB with ADL's Exercise tolerated well Personal goals reviewed No report of cardiac concerns or symptoms Strength  training completed today  Goals Unmet:  Not Applicable  Comments: Service time is from 1330 to 1500   Dr. Wesam G. Yacoub is Medical Director for Pulmonary Rehab at [facility]. 

## 2016-11-06 ENCOUNTER — Other Ambulatory Visit: Payer: Self-pay | Admitting: Pulmonary Disease

## 2016-11-07 ENCOUNTER — Encounter (HOSPITAL_COMMUNITY)
Admission: RE | Admit: 2016-11-07 | Discharge: 2016-11-07 | Disposition: A | Discharge status: Home / Self Care | Payer: Medicare Other | Source: Ambulatory Visit | Attending: Pulmonary Disease | Admitting: Pulmonary Disease

## 2016-11-07 VITALS — Wt 260.4 lb

## 2016-11-07 DIAGNOSIS — J438 Other emphysema: Secondary | ICD-10-CM

## 2016-11-07 DIAGNOSIS — D869 Sarcoidosis, unspecified: Secondary | ICD-10-CM

## 2016-11-07 DIAGNOSIS — J9611 Chronic respiratory failure with hypoxia: Secondary | ICD-10-CM

## 2016-11-07 NOTE — Progress Notes (Signed)
Daily Session Note  Patient Details  Name: Travis Ferguson MRN: [id number] Date of Birth: [birth year] Referring Provider:     Pulmonary Rehab Walk Test from 10/17/2016 in [place]  Referring Provider  Dr. Halford Chessman      Encounter Date: 11/07/2016  Check In:     Session Check In - 11/07/16 1339      Check-In   Location [facility]   Staff Present Su Hilt, MS, ACSM RCEP, Exercise Physiologist;Latayvia Mandujano Leonia Reeves, RN, Roque Cash, RN   Supervising physician immediately available to respond to emergencies [facility] immediately available   Physician(s) Dr. Broadus John   Medication changes reported     No   Fall or balance concerns reported    No   Tobacco Cessation No Change   Warm-up and Cool-down Performed as group-led instruction   Resistance Training Performed Yes   VAD Patient? No     Pain Assessment   Currently in Pain? No/denies   Multiple Pain Sites No      Capillary Blood Glucose: No results found for this or any previous visit (from the past 24 hour(s)).      Exercise Prescription Changes - 11/07/16 1500      Response to Exercise   Blood Pressure (Admit) 100/60   Blood Pressure (Exercise) 100/66   Blood Pressure (Exit) 100/62   Heart Rate (Admit) 99 bpm   Heart Rate (Exercise) 106 bpm   Heart Rate (Exit) 98 bpm   Oxygen Saturation (Admit) 92 %   Oxygen Saturation (Exercise) 92 %   Oxygen Saturation (Exit) 94 %   Rating of Perceived Exertion (Exercise) 11   Perceived Dyspnea (Exercise) 0   Duration Progress to 45 minutes of aerobic exercise without signs/symptoms of physical distress   Intensity THRR unchanged     Progression   Progression Continue to progress workloads to maintain intensity without signs/symptoms of physical distress.     Resistance Training   Training Prescription Yes   Weight orange bands   Reps 10-15   Time --  10 minutes     Interval Training   Interval Training No     NuStep   Level 3   Minutes 17   METs 1.5     Track   Laps 8   Minutes 17      History  Smoking Status  . Former Smoker  . Packs/day: 0.00  . Years: 30.00  . Types: Pipe  . Quit date: 06/24/2008  Smokeless Tobacco  . Never Used    Goals Met:  Exercise tolerated well Strength training completed today  Goals Unmet:  Not Applicable  Comments: Service time is from 1330 to 1510.    Dr. Rush Farmer is Medical Director for Pulmonary Rehab at [facility].

## 2016-11-12 ENCOUNTER — Encounter (HOSPITAL_COMMUNITY): Payer: Medicare Other

## 2016-11-14 ENCOUNTER — Encounter (HOSPITAL_COMMUNITY)
Admission: RE | Admit: 2016-11-14 | Discharge: 2016-11-14 | Disposition: A | Discharge status: Home / Self Care | Payer: Medicare Other | Source: Ambulatory Visit | Attending: Pulmonary Disease | Admitting: Pulmonary Disease

## 2016-11-14 VITALS — Wt 258.6 lb

## 2016-11-14 DIAGNOSIS — D869 Sarcoidosis, unspecified: Secondary | ICD-10-CM

## 2016-11-14 DIAGNOSIS — J9611 Chronic respiratory failure with hypoxia: Secondary | ICD-10-CM

## 2016-11-14 DIAGNOSIS — J438 Other emphysema: Secondary | ICD-10-CM | POA: Diagnosis not present

## 2016-11-14 NOTE — Progress Notes (Signed)
Daily Session Note  Patient Details  Name: Travis Ferguson MRN: [id number] Date of Birth: 01-30-1941 Referring Provider:     Pulmonary Rehab Walk Test from 10/17/2016 in [place]  Referring Provider  Dr. Halford Chessman      Encounter Date: 11/14/2016  Check In:     Session Check In - 11/14/16 1330      Check-In   Location [facility]   Staff Present Ramon Dredge, RN, MHA;Joan Leonia Reeves, RN, Luisa Hart, RN, BSN   Supervising physician immediately available to respond to emergencies [facility] immediately available   Physician(s) Dr. Karleen Hampshire   Medication changes reported     No   Fall or balance concerns reported    No   Tobacco Cessation No Change   Warm-up and Cool-down Performed as group-led instruction   Resistance Training Performed Yes   VAD Patient? No     Pain Assessment   Currently in Pain? No/denies   Multiple Pain Sites No      Capillary Blood Glucose: No results found for this or any previous visit (from the past 24 hour(s)).      Exercise Prescription Changes - 11/14/16 1611      Response to Exercise   Blood Pressure (Admit) 90/50   Blood Pressure (Exercise) 108/60   Blood Pressure (Exit) 100/60   Heart Rate (Admit) 72 bpm   Heart Rate (Exercise) 87 bpm   Heart Rate (Exit) 72 bpm   Oxygen Saturation (Admit) 98 %   Oxygen Saturation (Exercise) 97 %   Oxygen Saturation (Exit) 97 %   Rating of Perceived Exertion (Exercise) 12   Perceived Dyspnea (Exercise) 1   Duration Progress to 45 minutes of aerobic exercise without signs/symptoms of physical distress   Intensity THRR unchanged     Progression   Progression Continue to progress workloads to maintain intensity without signs/symptoms of physical distress.     Resistance Training   Training Prescription Yes   Weight orange bands   Reps 10-15   Time --  10 minutes     Interval Training   Interval Training No     Track   Laps 8   Minutes 34      History  Smoking Status  . Former Smoker  . Packs/day: 0.00  . Years: 30.00  . Types: Pipe  . Quit date: 06/24/2008  Smokeless Tobacco  . Never Used    Goals Met:  Using PLB without cueing & demonstrates good technique Exercise tolerated well No report of cardiac concerns or symptoms Strength training completed today  Goals Unmet:  Not Applicable  Comments: Service time is from 1330 to 1530   Dr. Rush Farmer is Medical Director for Pulmonary Rehab at [facility].

## 2016-11-19 ENCOUNTER — Encounter (HOSPITAL_COMMUNITY): Payer: Self-pay

## 2016-11-19 ENCOUNTER — Encounter (HOSPITAL_COMMUNITY)
Admission: RE | Admit: 2016-11-19 | Discharge: 2016-11-19 | Disposition: A | Discharge status: Home / Self Care | Payer: Medicare Other | Source: Ambulatory Visit | Attending: Pulmonary Disease | Admitting: Pulmonary Disease

## 2016-11-19 VITALS — Wt 262.6 lb

## 2016-11-19 DIAGNOSIS — J438 Other emphysema: Secondary | ICD-10-CM | POA: Diagnosis not present

## 2016-11-19 DIAGNOSIS — J9611 Chronic respiratory failure with hypoxia: Secondary | ICD-10-CM

## 2016-11-19 DIAGNOSIS — D869 Sarcoidosis, unspecified: Secondary | ICD-10-CM

## 2016-11-19 NOTE — Progress Notes (Signed)
Daily Session Note  Patient Details  Name: Travis Ferguson MRN: [id number] Date of Birth: 1940/12/04 Referring Provider:     Pulmonary Rehab Walk Test from 10/17/2016 in [place]  Referring Provider  Dr. Halford Chessman      Encounter Date: 11/19/2016  Check In:     Session Check In - 11/19/16 1325      Check-In   Location [facility]   Staff Present Rosebud Poles, RN, BSN;Rozelia Catapano, MS, ACSM RCEP, Exercise Physiologist;Portia Rollene Rotunda, RN, BSN   Supervising physician immediately available to respond to emergencies [facility] immediately available   Physician(s) Dr. Karleen Hampshire   Medication changes reported     No   Fall or balance concerns reported    No   Tobacco Cessation No Change   Warm-up and Cool-down Performed as group-led instruction   Resistance Training Performed Yes   VAD Patient? No     Pain Assessment   Currently in Pain? No/denies   Multiple Pain Sites No      Capillary Blood Glucose: No results found for this or any previous visit (from the past 24 hour(s)).      Exercise Prescription Changes - 11/19/16 1500      Response to Exercise   Blood Pressure (Admit) 100/70   Blood Pressure (Exercise) 114/60   Blood Pressure (Exit) 98/62   Heart Rate (Admit) 98 bpm   Heart Rate (Exercise) 95 bpm   Heart Rate (Exit) 82 bpm   Oxygen Saturation (Admit) 94 %   Oxygen Saturation (Exercise) 91 %   Oxygen Saturation (Exit) 93 %   Rating of Perceived Exertion (Exercise) 13   Perceived Dyspnea (Exercise) 0   Duration Progress to 45 minutes of aerobic exercise without signs/symptoms of physical distress   Intensity THRR unchanged     Progression   Progression Continue to progress workloads to maintain intensity without signs/symptoms of physical distress.     Resistance Training   Training Prescription Yes   Weight orange bands   Reps 10-15   Time --  10 minutes     Interval Training   Interval Training No     NuStep   Level 4   Minutes 34   METs 1.4     Track   Laps 7   Minutes 17      History  Smoking Status  . Former Smoker  . Packs/day: 0.00  . Years: 30.00  . Types: Pipe  . Quit date: 06/24/2008  Smokeless Tobacco  . Never Used    Goals Met:  Exercise tolerated well No report of cardiac concerns or symptoms Strength training completed today  Goals Unmet:  Not Applicable  Comments: Service time is from 1:30p to 3:10p    Dr. Rush Farmer is Medical Director for Pulmonary Rehab at [facility].

## 2016-11-21 ENCOUNTER — Encounter (HOSPITAL_COMMUNITY)
Admission: RE | Admit: 2016-11-21 | Discharge: 2016-11-21 | Disposition: A | Discharge status: Home / Self Care | Payer: Medicare Other | Source: Ambulatory Visit | Attending: Pulmonary Disease | Admitting: Pulmonary Disease

## 2016-11-21 VITALS — Wt 263.9 lb

## 2016-11-21 DIAGNOSIS — J438 Other emphysema: Secondary | ICD-10-CM

## 2016-11-21 DIAGNOSIS — J9611 Chronic respiratory failure with hypoxia: Secondary | ICD-10-CM

## 2016-11-21 DIAGNOSIS — D869 Sarcoidosis, unspecified: Secondary | ICD-10-CM

## 2016-11-21 NOTE — Progress Notes (Signed)
Daily Session Note  Patient Details  Name: Travis Ferguson MRN: [id number] Date of Birth: [birth year] Referring Provider:     Pulmonary Rehab Walk Test from 10/17/2016 in [place]  Referring Provider  Dr. Halford Chessman      Encounter Date: 11/21/2016  Check In:     Session Check In - 11/21/16 1344      Check-In   Location [facility]   Staff Present Trish Fountain, RN, BSN;Gurbani Figge, MS, ACSM RCEP, Exercise Physiologist   Supervising physician immediately available to respond to emergencies [facility] immediately available   Physician(s) Dr. Broadus John   Medication changes reported     No   Fall or balance concerns reported    No   Tobacco Cessation No Change   Warm-up and Cool-down Performed as group-led instruction   Resistance Training Performed Yes   VAD Patient? No     Pain Assessment   Currently in Pain? No/denies   Multiple Pain Sites No      Capillary Blood Glucose: No results found for this or any previous visit (from the past 24 hour(s)).      Exercise Prescription Changes - 11/21/16 1600      Response to Exercise   Blood Pressure (Admit) 98/66   Blood Pressure (Exercise) 132/74   Blood Pressure (Exit) 102/64   Heart Rate (Admit) 80 bpm   Heart Rate (Exercise) 89 bpm   Heart Rate (Exit) 83 bpm   Oxygen Saturation (Admit) 94 %   Oxygen Saturation (Exercise) 92 %   Oxygen Saturation (Exit) 93 %   Rating of Perceived Exertion (Exercise) 12   Perceived Dyspnea (Exercise) 1   Duration Progress to 45 minutes of aerobic exercise without signs/symptoms of physical distress   Intensity THRR unchanged     Progression   Progression Continue to progress workloads to maintain intensity without signs/symptoms of physical distress.     Resistance Training   Training Prescription Yes   Weight orange bands   Reps 10-15   Time --  10 minutes     Interval Training   Interval Training No     NuStep   Level 4   Minutes 17   METs 1.5     Track   Laps 6   Minutes 17      History  Smoking Status  . Former Smoker  . Packs/day: 0.00  . Years: 30.00  . Types: Pipe  . Quit date: 06/24/2008  Smokeless Tobacco  . Never Used    Goals Met:  Exercise tolerated well No report of cardiac concerns or symptoms Strength training completed today  Goals Unmet:  Not Applicable  Comments: Service time is from 1:30p to 3:10p    Dr. Rush Farmer is Medical Director for Pulmonary Rehab at [facility].

## 2016-11-26 ENCOUNTER — Encounter (HOSPITAL_COMMUNITY)
Admission: RE | Admit: 2016-11-26 | Discharge: 2016-11-26 | Disposition: A | Discharge status: Home / Self Care | Payer: Medicare Other | Source: Ambulatory Visit | Attending: Pulmonary Disease | Admitting: Pulmonary Disease

## 2016-11-26 DIAGNOSIS — J9611 Chronic respiratory failure with hypoxia: Secondary | ICD-10-CM

## 2016-11-26 DIAGNOSIS — J438 Other emphysema: Secondary | ICD-10-CM | POA: Insufficient documentation

## 2016-11-26 NOTE — Progress Notes (Signed)
Pulmonary Individual Treatment Plan  Patient Details  Name: Travis Ferguson MRN: [id number] Date of Birth: 1940-11-01 Referring Provider:     Pulmonary Rehab Walk Test from 10/17/2016 in [place]  Referring Provider  Dr. Halford Chessman      Initial Encounter Date:    Pulmonary Rehab Walk Test from 10/17/2016 in [place]  Date  10/18/16  Referring Provider  Dr. Halford Chessman      Visit Diagnosis: Chronic respiratory failure with hypoxia ([place])  Other emphysema ([place])  Patient's Home Medications on Admission:   Current Outpatient Prescriptions:  .  budesonide-formoterol (SYMBICORT) 160-4.5 MCG/ACT inhaler, Inhale 2 puffs into the lungs 2 (two) times daily., Disp: 1 Inhaler, Rfl: 0 .  budesonide-formoterol (SYMBICORT) 160-4.5 MCG/ACT inhaler, Inhale 2 puffs into the lungs 2 (two) times daily., Disp: 2 Inhaler, Rfl: 0 .  DULoxetine (CYMBALTA) 30 MG capsule, Take 30 mg by mouth daily., Disp: , Rfl:  .  ENTRESTO 24-26 MG, TAKE 1 TABLET BY MOUTH 2 (TWO) TIMES DAILY., Disp: 180 tablet, Rfl: 1 .  finasteride (PROSCAR) 5 MG tablet, Take 5 mg by mouth daily. , Disp: , Rfl:  .  FLUoxetine (PROZAC) 40 MG capsule, Take 40 mg by mouth daily with breakfast. , Disp: , Rfl:  .  furosemide (LASIX) 20 MG tablet, Take 1 tablet (20 mg total) by mouth daily., Disp: 30 tablet, Rfl: 4 .  HYDROcodone-acetaminophen (NORCO/VICODIN) 5-325 MG tablet, Take 1 tablet by mouth every 6 (six) hours as needed for moderate pain., Disp: , Rfl:  .  ipratropium (ATROVENT) 0.02 % nebulizer solution, Take 2.5 mLs (0.5 mg total) by nebulization 4 (four) times daily., Disp: 300 mL, Rfl: 12 .  metoprolol tartrate (LOPRESSOR) 25 MG tablet, TAKE 1 TABLET (25 MG TOTAL) BY MOUTH 2 (TWO) TIMES DAILY., Disp: 90 tablet, Rfl: 1 .  Multiple Vitamin (MULTIVITAMIN) tablet, Take 1 tablet by mouth daily., Disp: , Rfl:  .  OXYGEN, Inhale 2 L/min into the lungs at bedtime., Disp: , Rfl:  .  PROAIR  HFA 108 (90 BASE) MCG/ACT inhaler, 1-2 PUFFS EVERY 4-6 HOURS AS NEEDED (Patient taking differently: 1-2 PUFFS EVERY 4-6 HOURS AS NEEDED for shortness of breath), Disp: 8.5 Inhaler, Rfl: 1 .  simvastatin (ZOCOR) 20 MG tablet, Take 20 mg by mouth every evening. , Disp: , Rfl:  .  sodium chloride (OCEAN) 0.65 % SOLN nasal spray, Place 1-2 sprays into both nostrils daily as needed for congestion., Disp: , Rfl:  .  spironolactone (ALDACTONE) 25 MG tablet, Take 1 tablet (25 mg total) by mouth daily., Disp: 30 tablet, Rfl: 4 .  SYMBICORT 160-4.5 MCG/ACT inhaler, INHALE 2 PUFFS INTO THE LUNGS 2 (TWO) TIMES DAILY., Disp: 20.4 g, Rfl: 5  Past Medical History: Past Medical History:  Diagnosis Date  . Acute on chronic combined systolic and diastolic CHF (congestive heart failure) ([place]) 02/24/2015  . Anxiety   . Arthritis    "about q joint I've got" (09/22/2015)  . Atherosclerosis   . Calculus of gallbladder with acute cholecystitis and obstruction   . CAP (community acquired pneumonia) 02/24/2015  . Cervical disc disease   . Chronic lower back pain    chronic back pain/under pain management  . Colon polyps    adenomatous and hyperplastic  . Colon polyps 08/02/2014   Tubular adenoma x 3, Hyperplastic-1  . COPD (chronic obstructive pulmonary disease) ([place])    a. Former Airline pilot, also smoked a pipe.  . Coronary artery  calcification seen on CAT scan   . Depression   . Diastolic dysfunction    grade 1 diastolic dysfunction on 2-D echo  . Dyslipidemia   . Dyspnea    chronic  . Encounter for biliary drainage tube placement    remains with drainage bag to right side for  biliary drainage.  . Frequent urination   . Gallstones   . Gangrenous cholecystitis 08/17/2014  . GERD (gastroesophageal reflux disease)   . Hiatal hernia    sensation problem ("right lateral side hip to knee").  . History of blood transfusion "numerous"  . History of oxygen administration    @ 2 l/m nasally at bedtime.(09/22/2015)   . Hypertension   . Inguinal hernia    right side  . Left ventricular dysfunction    ejection fraction of 25-30% with regional wall motion abnormalities.  . Migraine    "years since I've had one" (09/22/2015)  . OSA (obstructive sleep apnea)    does not use CPAP; "just oxygen" (09/22/2015)  . Pneumonia ~ 1958  . PTSD (post-traumatic stress disorder)   . Rhinitis   . Sarcoidosis   . Sensation problem    right side-lateral hip to knee" decreased sensation and tingling feeling" -has informed Dr. Joylene Draft, Dr. Henrene Pastor, Dr. Lucia Gaskins of this.  . Sepsis ([place])   . Sinus congestion    10-27-14 at present some issues-"not bad"  . Steroid-induced hyperglycemia 02/25/2015  . Subdural hematoma ([place]) 11/10   a. 2010 s/p surgery - diagnosed several weeks after a fall.    Tobacco Use: History  Smoking Status  . Former Smoker  . Packs/day: 0.00  . Years: 30.00  . Types: Pipe  . Quit date: 06/24/2008  Smokeless Tobacco  . Never Used    Labs: Recent Review Flowsheet Data    Labs for ITP Cardiac and Pulmonary Rehab Latest Ref Rng & Units 05/11/2009 08/19/2014 01/23/2015 01/23/2015 02/24/2015   PHART 7.350 - 7.450 7.375 7.373 7.409 - 7.295(L)   PCO2ART 35.0 - 45.0 mmHg 36.6 33.5(L) 34.6(L) - 44.0   HCO3 20.0 - 24.0 mEq/L 20.9 19.1(L) 21.9 23.7 20.8   TCO2 0 - 100 mmol/L 22.0 16.9 23 25  18.7   ACIDBASEDEF 0.0 - 2.0 mmol/L 3.4(H) 4.8(H) 2.0 1.0 5.2(H)   O2SAT % 98.9 93.4 94.0 68.0 99.5      Capillary Blood Glucose: Lab Results  Component Value Date   GLUCAP 121 (H) 02/27/2015   GLUCAP 170 (H) 02/26/2015   GLUCAP 133 (H) 02/26/2015   GLUCAP 146 (H) 02/26/2015   GLUCAP 153 (H) 02/26/2015     ADL UCSD:   Pulmonary Function Assessment:     Pulmonary Function Assessment - 10/14/16 1245      Breath   Bilateral Breath Sounds Clear   Shortness of Breath No;Limiting activity      Exercise Target Goals:    Exercise Program Goal: Individual exercise prescription set with THRR, safety & activity  barriers. Participant demonstrates ability to understand and report RPE using BORG scale, to self-measure pulse accurately, and to acknowledge the importance of the exercise prescription.  Exercise Prescription Goal: Starting with aerobic activity 30 plus minutes a day, 3 days per week for initial exercise prescription. Provide home exercise prescription and guidelines that participant acknowledges understanding prior to discharge.  Activity Barriers & Risk Stratification:   6 Minute Walk:     6 Minute Walk    Row Name 10/18/16 0822         6 Minute Walk  Phase Initial     Distance 1030 feet     Walk Time 6 minutes     # of Rest Breaks 0     MPH 1.95     METS 2.53     RPE 11     Perceived Dyspnea  1     Symptoms Yes (comment)     Comments Chronic pain     Resting HR 67 bpm     Resting BP 100/70     Max Ex. HR 109 bpm     Max Ex. BP 132/80       Interval HR   Baseline HR 67     1 Minute HR 105     2 Minute HR 88     3 Minute HR 107     4 Minute HR 89     5 Minute HR 101     6 Minute HR 109     2 Minute Post HR 100     Interval Heart Rate? Yes       Interval Oxygen   Interval Oxygen? Yes     Baseline Oxygen Saturation % 92 %     Baseline Liters of Oxygen 0 L     1 Minute Oxygen Saturation % 90 %     1 Minute Liters of Oxygen 0 L     2 Minute Oxygen Saturation % 89 %     2 Minute Liters of Oxygen 0 L     3 Minute Oxygen Saturation % 89 %     3 Minute Liters of Oxygen 0 L     4 Minute Oxygen Saturation % 89 %     4 Minute Liters of Oxygen 0 L     5 Minute Oxygen Saturation % 88 %     5 Minute Liters of Oxygen 0 L     6 Minute Oxygen Saturation % 89 %     6 Minute Liters of Oxygen 0 L     2 Minute Post Oxygen Saturation % 92 %     2 Minute Post Liters of Oxygen 0 L        Oxygen Initial Assessment:     Oxygen Initial Assessment - 10/18/16 1114      Initial 6 min Walk   Oxygen Used None   Resting Oxygen Saturation  during 6 min walk 92 %   Exercise  Oxygen Saturation  during 6 min walk 88 %      Oxygen Re-Evaluation:     Oxygen Re-Evaluation    Row Name 10/29/16 0914 10/29/16 0915 11/26/16 0945         Program Oxygen Prescription   Program Oxygen Prescription None  - None       Home Oxygen   Home Oxygen Device  - Home Concentrator;E-Tanks Home Concentrator;E-Tanks  sleeps with oxygen     Sleep Oxygen Prescription  - Continuous Continuous     Liters per minute  - 2 2     Home Exercise Oxygen Prescription  - None None     Home at Rest Exercise Oxygen Prescription  - None None     Compliance with Home Oxygen Use  - Yes Yes       Goals/Expected Outcomes   Short Term Goals  -  - To learn and exhibit compliance with exercise, home and travel O2 prescription     Long  Term Goals  - Exhibits compliance with exercise, home and travel O2 prescription  Exhibits compliance with exercise, home and travel O2 prescription     Comments  - follows home oxygen when sleeping at night compliant with oxygen at night     Goals/Expected Outcomes  - continued compliance continued compliance        Oxygen Discharge (Final Oxygen Re-Evaluation):     Oxygen Re-Evaluation - 11/26/16 0945      Program Oxygen Prescription   Program Oxygen Prescription None     Home Oxygen   Home Oxygen Device Home Concentrator;E-Tanks  sleeps with oxygen   Sleep Oxygen Prescription Continuous   Liters per minute 2   Home Exercise Oxygen Prescription None   Home at Rest Exercise Oxygen Prescription None   Compliance with Home Oxygen Use Yes     Goals/Expected Outcomes   Short Term Goals To learn and exhibit compliance with exercise, home and travel O2 prescription   Long  Term Goals Exhibits compliance with exercise, home and travel O2 prescription   Comments compliant with oxygen at night   Goals/Expected Outcomes continued compliance      Initial Exercise Prescription:     Initial Exercise Prescription - 10/18/16 0800      Date of Initial  Exercise RX and Referring Provider   Date 10/18/16   Referring Provider Dr. Halford Chessman     NuStep   Level 2   Minutes 34   METs 1.5     Track   Laps 5   Minutes 17     Prescription Details   Frequency (times per week) 2   Duration Progress to 45 minutes of aerobic exercise without signs/symptoms of physical distress     Intensity   THRR 40-80% of Max Heartrate 58-115   Ratings of Perceived Exertion 11-13   Perceived Dyspnea 0-4     Progression   Progression Continue progressive overload as per policy without signs/symptoms or physical distress.     Resistance Training   Training Prescription Yes   Weight orange bands   Reps 10-15      Perform Capillary Blood Glucose checks as needed.  Exercise Prescription Changes:     Exercise Prescription Changes    Row Name 10/24/16 1500 10/29/16 1602 11/05/16 1513 11/07/16 1500 11/14/16 1611     Response to Exercise   Blood Pressure (Admit) 96/60 108/64 102/70 100/60 90/50   Blood Pressure (Exercise) 120/70 124/70 120/76 100/66 108/60   Blood Pressure (Exit) 110/60 112/60 112/72 100/62 100/60   Heart Rate (Admit) 102 bpm 91 bpm 91 bpm 99 bpm 72 bpm   Heart Rate (Exercise) 109 bpm 111 bpm 91 bpm 106 bpm 87 bpm   Heart Rate (Exit) 105 bpm 93 bpm 87 bpm 98 bpm 72 bpm   Oxygen Saturation (Admit) 95 % 96 % 96 % 92 % 98 %   Oxygen Saturation (Exercise) 95 % 91 % 93 % 92 % 97 %   Oxygen Saturation (Exit) 95 % 95 % 96 % 94 % 97 %   Rating of Perceived Exertion (Exercise) 11 11 11 11 12    Perceived Dyspnea (Exercise) 2 0 0 0 1   Duration Progress to 45 minutes of aerobic exercise without signs/symptoms of physical distress Progress to 45 minutes of aerobic exercise without signs/symptoms of physical distress Progress to 45 minutes of aerobic exercise without signs/symptoms of physical distress Progress to 45 minutes of aerobic exercise without signs/symptoms of physical distress Progress to 45 minutes of aerobic exercise without  signs/symptoms of physical distress   Intensity  -  -  -  THRR unchanged THRR unchanged     Progression   Progression Continue to progress workloads to maintain intensity without signs/symptoms of physical distress. Continue to progress workloads to maintain intensity without signs/symptoms of physical distress. Continue to progress workloads to maintain intensity without signs/symptoms of physical distress. Continue to progress workloads to maintain intensity without signs/symptoms of physical distress. Continue to progress workloads to maintain intensity without signs/symptoms of physical distress.     Resistance Training   Training Prescription Yes Yes Yes Yes Yes   Weight orange bands orange bands orange bands orange bands orange bands   Reps 10-15 10-15 10-15 10-15 10-15   Time  -  -  - -  10 minutes -  10 minutes     Interval Training   Interval Training  -  -  - No No     NuStep   Level 2 3 3 3   -   Minutes 17 17 34 17  -   METs 1.5 1.7 1.5 1.5  -     Track   Laps 10 10 9 8 8    Minutes 17 17 17 17  34   Row Name 11/19/16 1500 11/21/16 1600           Response to Exercise   Blood Pressure (Admit) 100/70 98/66      Blood Pressure (Exercise) 114/60 132/74      Blood Pressure (Exit) 98/62 102/64      Heart Rate (Admit) 98 bpm 80 bpm      Heart Rate (Exercise) 95 bpm 89 bpm      Heart Rate (Exit) 82 bpm 83 bpm      Oxygen Saturation (Admit) 94 % 94 %      Oxygen Saturation (Exercise) 91 % 92 %      Oxygen Saturation (Exit) 93 % 93 %      Rating of Perceived Exertion (Exercise) 13 12      Perceived Dyspnea (Exercise) 0 1      Duration Progress to 45 minutes of aerobic exercise without signs/symptoms of physical distress Progress to 45 minutes of aerobic exercise without signs/symptoms of physical distress      Intensity THRR unchanged THRR unchanged        Progression   Progression Continue to progress workloads to maintain intensity without signs/symptoms of physical  distress. Continue to progress workloads to maintain intensity without signs/symptoms of physical distress.        Resistance Training   Training Prescription Yes Yes      Weight orange bands orange bands      Reps 10-15 10-15      Time -  10 minutes -  10 minutes        Interval Training   Interval Training No No        NuStep   Level 4 4      Minutes 34 17      METs 1.4 1.5        Track   Laps 7 6      Minutes 17 17         Exercise Comments:   Exercise Goals and Review:   Exercise Goals Re-Evaluation :     Exercise Goals Re-Evaluation    Row Name 10/28/16 1259 11/25/16 1146           Exercise Goal Re-Evaluation   Exercise Goals Review Increase Physical Activity;Increase Strenth and Stamina Increase Physical Activity;Increase Strenth and Stamina      Comments  Patient has only attended one exercise session. Will cont. to monitor and progress as able.  Patient is progressing low and slow. MET levels average about 1.5. Patient is limited by chronic pain. Will cont. to monitor and progress as able.       Expected Outcomes Through exercising at rehab and at home, the patient will be able to increase strength and stamina which will make ADL's easier to preform. Through exercising at rehab and at home, patient will increase physical strength and stamina and find ADL's easier to perform.          Discharge Exercise Prescription (Final Exercise Prescription Changes):     Exercise Prescription Changes - 11/21/16 1600      Response to Exercise   Blood Pressure (Admit) 98/66   Blood Pressure (Exercise) 132/74   Blood Pressure (Exit) 102/64   Heart Rate (Admit) 80 bpm   Heart Rate (Exercise) 89 bpm   Heart Rate (Exit) 83 bpm   Oxygen Saturation (Admit) 94 %   Oxygen Saturation (Exercise) 92 %   Oxygen Saturation (Exit) 93 %   Rating of Perceived Exertion (Exercise) 12   Perceived Dyspnea (Exercise) 1   Duration Progress to 45 minutes of aerobic exercise without  signs/symptoms of physical distress   Intensity THRR unchanged     Progression   Progression Continue to progress workloads to maintain intensity without signs/symptoms of physical distress.     Resistance Training   Training Prescription Yes   Weight orange bands   Reps 10-15   Time --  10 minutes     Interval Training   Interval Training No     NuStep   Level 4   Minutes 17   METs 1.5     Track   Laps 6   Minutes 17      Nutrition:  Target Goals: Understanding of nutrition guidelines, daily intake of sodium <1527m, cholesterol <2064m calories 30% from fat and 7% or less from saturated fats, daily to have 5 or more servings of fruits and vegetables.  Biometrics:     Pre Biometrics - 10/14/16 1251      Pre Biometrics   Grip Strength 25 kg       Nutrition Therapy Plan and Nutrition Goals:   Nutrition Discharge: Rate Your Plate Scores:   Nutrition Goals Re-Evaluation:   Nutrition Goals Discharge (Final Nutrition Goals Re-Evaluation):   Psychosocial: Target Goals: Acknowledge presence or absence of significant depression and/or stress, maximize coping skills, provide positive support system. Participant is able to verbalize types and ability to use techniques and skills needed for reducing stress and depression.  Initial Review & Psychosocial Screening:     Initial Psych Review & Screening - 10/14/16 1254      Initial Review   Current issues with None Identified     Family Dynamics   Good Support System? Yes     Barriers   Psychosocial barriers to participate in program There are no identifiable barriers or psychosocial needs.     Screening Interventions   Interventions Encouraged to exercise      Quality of Life Scores:   PHQ-9: Recent Review Flowsheet Data    [facility] 10/14/2016 10/30/2015 06/12/2015 03/13/2015 12/23/2014   Decreased Interest 0 0 0 0 0   Down, Depressed, Hopeless 0 0 3 1 1    PHQ - 2 Score 0 0 3 1 1     Altered sleeping - - 3 - -   Tired, decreased energy - -  3 - -   Change in appetite - - 0 - -   Feeling bad or failure about yourself  - - 0 - -   Trouble concentrating - - 0 - -   Moving slowly or fidgety/restless - - 0 - -   Suicidal thoughts - - 0 - -   PHQ-9 Score - - 9 - -   Difficult doing work/chores - - Somewhat difficult - -     Interpretation of Total Score  Total Score Depression Severity:  1-4 = Minimal depression, 5-9 = Mild depression, 10-14 = Moderate depression, 15-19 = Moderately severe depression, 20-27 = Severe depression   Psychosocial Evaluation and Intervention:     Psychosocial Evaluation - 10/14/16 1255      Psychosocial Evaluation & Interventions   Interventions Encouraged to exercise with the program and follow exercise prescription   Continue Psychosocial Services  No Follow up required      Psychosocial Re-Evaluation:     Psychosocial Re-Evaluation    Row Name 10/29/16 0918 11/26/16 0950           Psychosocial Re-Evaluation   Current issues with None Identified None Identified      Interventions Encouraged to attend Pulmonary Rehabilitation for the exercise Encouraged to attend Pulmonary Rehabilitation for the exercise      Continue Psychosocial Services  No Follow up required No Follow up required         Psychosocial Discharge (Final Psychosocial Re-Evaluation):     Psychosocial Re-Evaluation - 11/26/16 0950      Psychosocial Re-Evaluation   Current issues with None Identified   Interventions Encouraged to attend Pulmonary Rehabilitation for the exercise   Continue Psychosocial Services  No Follow up required      Education: Education Goals: Education classes will be provided on a weekly basis, covering required topics. Participant will state understanding/return demonstration of topics presented.  Learning Barriers/Preferences:     Learning Barriers/Preferences - 10/14/16 1244      Learning Barriers/Preferences   Learning  Barriers None   Learning Preferences Written Material;Video;Pictoral;Computer/Internet      Education Topics: Risk Factor Reduction:  -Group instruction that is supported by a PowerPoint presentation. Instructor discusses the definition of a risk factor, different risk factors for pulmonary disease, and how the heart and lungs work together.     Nutrition for Pulmonary Patient:  -Group instruction provided by PowerPoint slides, verbal discussion, and written materials to support subject matter. The instructor gives an explanation and review of healthy diet recommendations, which includes a discussion on weight management, recommendations for fruit and vegetable consumption, as well as protein, fluid, caffeine, fiber, sodium, sugar, and alcohol. Tips for eating when patients are short of breath are discussed.   PULMONARY REHAB OTHER RESPIRATORY from 01/18/2016 in [place]  Date  11/23/15  Educator  RD  Instruction Review Code  2- meets goals/outcomes      Pursed Lip Breathing:  -Group instruction that is supported by demonstration and informational handouts. Instructor discusses the benefits of pursed lip and diaphragmatic breathing and detailed demonstration on how to preform both.     PULMONARY REHAB OTHER RESPIRATORY from 01/18/2016 in [place]  Date  01/18/16  Educator  EP  Instruction Review Code  R- Review/reinforce      Oxygen Safety:  -Group instruction provided by PowerPoint, verbal discussion, and written material to support subject matter. There is an overview of "What is Oxygen" and "Why  do we need it".  Instructor also reviews how to create a safe environment for oxygen use, the importance of using oxygen as prescribed, and the risks of noncompliance. There is a brief discussion on traveling with oxygen and resources the patient may utilize.   PULMONARY REHAB OTHER RESPIRATORY from 01/18/2016 in [place]  Date  12/07/15  Educator  RN  Instruction Review Code  2- meets goals/outcomes      Oxygen Equipment:  -Group instruction provided by [facility] Staff utilizing handouts, written materials, and equipment demonstrations.   PULMONARY REHAB OTHER RESPIRATORY from 11/21/2016 in [place]  Date  11/14/16  Educator  Ace Gins  Instruction Review Code  2- meets goals/outcomes      Signs and Symptoms:  -Group instruction provided by written material and verbal discussion to support subject matter. Warning signs and symptoms of infection, stroke, and heart attack are reviewed and when to call the physician/911 reinforced. Tips for preventing the spread of infection discussed.   PULMONARY REHAB OTHER RESPIRATORY from 11/21/2016 in [place]  Date  10/24/16  Educator  RN  Instruction Review Code  2- meets goals/outcomes      Advanced Directives:  -Group instruction provided by verbal instruction and written material to support subject matter. Instructor reviews Advanced Directive laws and proper instruction for filling out document.   Pulmonary Video:  -Group video education that reviews the importance of medication and oxygen compliance, exercise, good nutrition, pulmonary hygiene, and pursed lip and diaphragmatic breathing for the pulmonary patient.   PULMONARY REHAB OTHER RESPIRATORY from 01/18/2016 in [place]  Date  11/30/15  Instruction Review Code  2- meets goals/outcomes      Exercise for the Pulmonary Patient:  -Group instruction that is supported by a PowerPoint presentation. Instructor discusses benefits of exercise, core components of exercise, frequency, duration, and intensity of an exercise routine, importance of utilizing pulse oximetry during exercise, safety while exercising, and options of places to exercise outside of rehab.     PULMONARY  REHAB OTHER RESPIRATORY from 01/18/2016 in [place]  Date  12/28/15  Educator  EP  Instruction Review Code  2- meets goals/outcomes      Pulmonary Medications:  -Verbally interactive group education provided by instructor with focus on inhaled medications and proper administration.   PULMONARY REHAB OTHER RESPIRATORY from 11/21/2016 in [place]  Date  11/07/16  Educator  Pharm  Instruction Review Code  2- meets goals/outcomes      Anatomy and Physiology of the Respiratory System and Intimacy:  -Group instruction provided by PowerPoint, verbal discussion, and written material to support subject matter. Instructor reviews respiratory cycle and anatomical components of the respiratory system and their functions. Instructor also reviews differences in obstructive and restrictive respiratory diseases with examples of each. Intimacy, Sex, and Sexuality differences are reviewed with a discussion on how relationships can change when diagnosed with pulmonary disease. Common sexual concerns are reviewed.   PULMONARY REHAB OTHER RESPIRATORY from 01/18/2016 in [place]  Date  01/11/16  Educator  RN  Instruction Review Code  2- meets goals/outcomes      MD DAY -A group question and answer session with a medical doctor that allows participants to ask questions that relate to their pulmonary disease state.   OTHER EDUCATION -Group or individual verbal, written, or video instructions  that support the educational goals of the pulmonary rehab program.   Knowledge Questionnaire Score:   Core Components/Risk Factors/Patient Goals at Admission:     Personal Goals and Risk Factors at Admission - 10/14/16 1253      Core Components/Risk Factors/Patient Goals on Admission   Improve shortness of breath with ADL's Yes   Intervention Provide education, individualized exercise plan and daily activity  instruction to help decrease symptoms of SOB with activities of daily living.   Expected Outcomes Short Term: Achieves a reduction of symptoms when performing activities of daily living.   Develop more efficient breathing techniques such as purse lipped breathing and diaphragmatic breathing; and practicing self-pacing with activity Yes   Intervention Provide education, demonstration and support about specific breathing techniuqes utilized for more efficient breathing. Include techniques such as pursed lipped breathing, diaphragmatic breathing and self-pacing activity.   Expected Outcomes Short Term: Participant will be able to demonstrate and use breathing techniques as needed throughout daily activities.   Increase knowledge of respiratory medications and ability to use respiratory devices properly  Yes   Intervention Provide education and demonstration as needed of appropriate use of medications, inhalers, and oxygen therapy.   Expected Outcomes Short Term: Achieves understanding of medications use. Understands that oxygen is a medication prescribed by physician. Demonstrates appropriate use of inhaler and oxygen therapy.      Core Components/Risk Factors/Patient Goals Review:      Goals and Risk Factor Review    Row Name 10/29/16 0917 11/26/16 0947           Core Components/Risk Factors/Patient Goals Review   Personal Goals Review Develop more efficient breathing techniques such as purse lipped breathing and diaphragmatic breathing and practicing self-pacing with activity.;Increase knowledge of respiratory medications and ability to use respiratory devices properly.;Improve shortness of breath with ADL's Develop more efficient breathing techniques such as purse lipped breathing and diaphragmatic breathing and practicing self-pacing with activity.;Improve shortness of breath with ADL's;Increase knowledge of respiratory medications and ability to use respiratory devices properly.      Review  has only attended 1 exercise session, too early to see progression to goals progressing with workload increases, does well in a group setting to keep him occupied 2 days per week      Expected Outcomes should see improvement in the next full 30 days continue to increase workloads as tolerated, continue with group support          Core Components/Risk Factors/Patient Goals at Discharge (Final Review):      Goals and Risk Factor Review - 11/26/16 0947      Core Components/Risk Factors/Patient Goals Review   Personal Goals Review Develop more efficient breathing techniques such as purse lipped breathing and diaphragmatic breathing and practicing self-pacing with activity.;Improve shortness of breath with ADL's;Increase knowledge of respiratory medications and ability to use respiratory devices properly.   Review progressing with workload increases, does well in a group setting to keep him occupied 2 days per week   Expected Outcomes continue to increase workloads as tolerated, continue with group support       ITP Comments:   Comments: ITP REVIEW Pt is making expected progress toward pulmonary rehab goals after completing 7 sessions. Recommend continued exercise, life style modification, education, and utilization of breathing techniques to increase stamina and strength and decrease shortness of breath with exertion.

## 2016-11-28 ENCOUNTER — Encounter (HOSPITAL_COMMUNITY)
Admission: RE | Admit: 2016-11-28 | Discharge: 2016-11-28 | Disposition: A | Discharge status: Home / Self Care | Payer: Medicare Other | Source: Ambulatory Visit | Attending: Pulmonary Disease | Admitting: Pulmonary Disease

## 2016-11-28 VITALS — Wt 261.9 lb

## 2016-11-28 DIAGNOSIS — J438 Other emphysema: Secondary | ICD-10-CM | POA: Diagnosis not present

## 2016-11-28 DIAGNOSIS — D869 Sarcoidosis, unspecified: Secondary | ICD-10-CM

## 2016-11-28 DIAGNOSIS — J9611 Chronic respiratory failure with hypoxia: Secondary | ICD-10-CM

## 2016-11-28 NOTE — Progress Notes (Signed)
Daily Session Note  Patient Details  Name: Travis Ferguson MRN: [id number] Date of Birth: 04-27-1941 Referring Provider:     Pulmonary Rehab Walk Test from 10/17/2016 in [place]  Referring Provider  Dr. Halford Chessman      Encounter Date: 11/28/2016  Check In:     Session Check In - 11/28/16 1330      Check-In   Location [facility]   Staff Present Rosebud Poles, RN, BSN;Molly diVincenzo, MS, ACSM RCEP, Exercise Physiologist;Lisa Ysidro Evert, RN   Supervising physician immediately available to respond to emergencies [facility] immediately available   Physician(s) Dr. Posey Pronto   Medication changes reported     No   Fall or balance concerns reported    No   Tobacco Cessation No Change   Warm-up and Cool-down Performed as group-led instruction   Resistance Training Performed Yes   VAD Patient? No     Pain Assessment   Currently in Pain? No/denies   Multiple Pain Sites No      Capillary Blood Glucose: No results found for this or any previous visit (from the past 24 hour(s)).      Exercise Prescription Changes - 11/28/16 1456      Response to Exercise   Blood Pressure (Admit) 110/66   Blood Pressure (Exercise) 102/60   Blood Pressure (Exit) 102/60   Heart Rate (Admit) 86 bpm   Heart Rate (Exercise) 71 bpm   Heart Rate (Exit) 82 bpm   Oxygen Saturation (Admit) 94 %   Oxygen Saturation (Exercise) 94 %   Oxygen Saturation (Exit) 96 %   Rating of Perceived Exertion (Exercise) 11   Perceived Dyspnea (Exercise) 0   Duration Progress to 45 minutes of aerobic exercise without signs/symptoms of physical distress   Intensity THRR unchanged     Progression   Progression Continue to progress workloads to maintain intensity without signs/symptoms of physical distress.     Resistance Training   Training Prescription Yes   Weight orange bands   Reps 10-15   Time --  10 minutes     Interval Training   Interval Training No     NuStep   Level 4   Minutes 51   METs 1.6      History  Smoking Status  . Former Smoker  . Packs/day: 0.00  . Years: 30.00  . Types: Pipe  . Quit date: 06/24/2008  Smokeless Tobacco  . Never Used    Goals Met:  Independence with exercise equipment Improved SOB with ADL's Exercise tolerated well No report of cardiac concerns or symptoms Strength training completed today  Goals Unmet:  Not Applicable  Comments: Service time is from 1330 to 1450   Dr. Rush Farmer is Medical Director for Pulmonary Rehab at [facility]

## 2016-11-28 NOTE — Progress Notes (Signed)
I have reviewed a Home Exercise Prescription with Travis Ferguson . Travis Ferguson is not currently exercising at home.  The patient was advised to walk 1 day a week for 20-30 minutes.  Travis Ferguson and I discussed how to progress their exercise prescription.  The patient stated that their goals were maintain independence and get back into silver sneakers. The patient stated that he really doesn't have time to exercise outside of rehab because he has a busy social schedule  The patient stated that they understand the exercise prescription.  We reviewed exercise guidelines, target heart rate during exercise, oxygen use, weather, home pulse oximeter, endpoints for exercise, and goals.  Patient is encouraged to come to me with any questions. I will continue to follow up with the patient to assist them with progression and safety.

## 2016-12-03 ENCOUNTER — Encounter (HOSPITAL_COMMUNITY)
Admission: RE | Admit: 2016-12-03 | Discharge: 2016-12-03 | Disposition: A | Discharge status: Home / Self Care | Payer: Medicare Other | Source: Ambulatory Visit | Attending: Pulmonary Disease | Admitting: Pulmonary Disease

## 2016-12-03 VITALS — Wt 262.3 lb

## 2016-12-03 DIAGNOSIS — J438 Other emphysema: Secondary | ICD-10-CM | POA: Diagnosis not present

## 2016-12-03 DIAGNOSIS — J9611 Chronic respiratory failure with hypoxia: Secondary | ICD-10-CM

## 2016-12-03 NOTE — Progress Notes (Signed)
Daily Session Note  Patient Details  Name: Travis Ferguson MRN: [id number] Date of Birth: 1940-11-01 Referring Provider:     Pulmonary Rehab Walk Test from 10/17/2016 in [place]  Referring Provider  Dr. Halford Chessman      Encounter Date: 12/03/2016  Check In:     Session Check In - 12/03/16 1330      Check-In   [facility]   Staff Present Rosebud Poles, RN, BSN;Molly diVincenzo, MS, ACSM RCEP, Exercise Physiologist;Mirel Hundal Rollene Rotunda, RN, BSN   Supervising physician immediately available to respond to emergencies [facility] immediately available   Physician(s) Dr. Posey Pronto   Medication changes reported     No   Fall or balance concerns reported    No   Tobacco Cessation No Change   Warm-up and Cool-down Performed as group-led instruction   Resistance Training Performed Yes   VAD Patient? No     Pain Assessment   Currently in Pain? No/denies   Multiple Pain Sites No      Capillary Blood Glucose: No results found for this or any previous visit (from the past 24 hour(s)).      Exercise Prescription Changes - 12/03/16 1619      Response to Exercise   Blood Pressure (Admit) 100/60   Blood Pressure (Exercise) 122/66   Blood Pressure (Exit) 110/62   Heart Rate (Admit) 90 bpm   Heart Rate (Exercise) 91 bpm   Heart Rate (Exit) 83 bpm   Oxygen Saturation (Admit) 94 %   Oxygen Saturation (Exercise) 91 %   Oxygen Saturation (Exit) 95 %   Rating of Perceived Exertion (Exercise) 13   Perceived Dyspnea (Exercise) 0   Duration Progress to 45 minutes of aerobic exercise without signs/symptoms of physical distress   Intensity THRR unchanged     Progression   Progression Continue to progress workloads to maintain intensity without signs/symptoms of physical distress.     Resistance Training   Training Prescription Yes   Weight orange bands   Reps 10-15   Time --  10 minutes     Interval Training   Interval Training No      NuStep   Level 4   Minutes 34   METs 1.5     Track   Laps 7   Minutes 17      History  Smoking Status  . Former Smoker  . Packs/day: 0.00  . Years: 30.00  . Types: Pipe  . Quit date: 06/24/2008  Smokeless Tobacco  . Never Used    Goals Met:  Independence with exercise equipment Using PLB without cueing & demonstrates good technique Exercise tolerated well No report of cardiac concerns or symptoms Strength training completed today  Goals Unmet:  Not Applicable  Comments: Service time is from 1330 to 1500   Dr. Rush Farmer is Medical Director for Pulmonary Rehab at [facility].

## 2016-12-05 ENCOUNTER — Encounter (HOSPITAL_COMMUNITY)
Admission: RE | Admit: 2016-12-05 | Discharge: 2016-12-05 | Disposition: A | Discharge status: Home / Self Care | Payer: Medicare Other | Source: Ambulatory Visit | Attending: Pulmonary Disease | Admitting: Pulmonary Disease

## 2016-12-05 VITALS — Wt 261.9 lb

## 2016-12-05 DIAGNOSIS — J438 Other emphysema: Secondary | ICD-10-CM | POA: Diagnosis not present

## 2016-12-05 DIAGNOSIS — D869 Sarcoidosis, unspecified: Secondary | ICD-10-CM

## 2016-12-05 DIAGNOSIS — J9611 Chronic respiratory failure with hypoxia: Secondary | ICD-10-CM

## 2016-12-05 NOTE — Progress Notes (Signed)
Daily Session Note  Patient Details  Name: Travis Ferguson MRN: [id number] Date of Birth: 07-16-1940 Referring Provider:     Pulmonary Rehab Walk Test from 10/17/2016 in [place]  Referring Provider  Dr. Halford Chessman      Encounter Date: 12/05/2016  Check In:     Session Check In - 12/05/16 1340      Check-In   [facility]   Staff Present Su Hilt, MS, ACSM RCEP, Exercise Physiologist;Armaan Pond Rollene Rotunda, RN, BSN   Supervising physician immediately available to respond to emergencies [facility] immediately available   Physician(s) Dr. Sloan Leiter   Medication changes reported     No   Fall or balance concerns reported    No   Tobacco Cessation No Change   Warm-up and Cool-down Performed as group-led instruction   Resistance Training Performed Yes   VAD Patient? No     Pain Assessment   Currently in Pain? No/denies   Multiple Pain Sites No      Capillary Blood Glucose: No results found for this or any previous visit (from the past 24 hour(s)).      Exercise Prescription Changes - 12/05/16 1603      Response to Exercise   Blood Pressure (Admit) 97/58   Blood Pressure (Exercise) 110/66   Blood Pressure (Exit) 104/64   Heart Rate (Admit) 87 bpm   Heart Rate (Exercise) 90 bpm   Heart Rate (Exit) 93 bpm   Oxygen Saturation (Admit) 94 %   Oxygen Saturation (Exercise) 95 %   Oxygen Saturation (Exit) 94 %   Rating of Perceived Exertion (Exercise) 11   Perceived Dyspnea (Exercise) 0   Duration Progress to 45 minutes of aerobic exercise without signs/symptoms of physical distress   Intensity THRR unchanged     Progression   Progression Continue to progress workloads to maintain intensity without signs/symptoms of physical distress.     Resistance Training   Training Prescription Yes   Weight orange bands   Reps 10-15   Time --  10 minutes     Interval Training   Interval Training No     NuStep   Level 4    Minutes 34   METs 1.6      History  Smoking Status  . Former Smoker  . Packs/day: 0.00  . Years: 30.00  . Types: Pipe  . Quit date: 06/24/2008  Smokeless Tobacco  . Never Used    Goals Met:  Independence with exercise equipment Improved SOB with ADL's Exercise tolerated well No report of cardiac concerns or symptoms Strength training completed today  Goals Unmet:  Not Applicable  Comments: Service time is from 1330 to 1530   Dr. Rush Farmer is Medical Director for Pulmonary Rehab at [facility]

## 2016-12-10 ENCOUNTER — Telehealth (HOSPITAL_COMMUNITY): Payer: Self-pay | Admitting: Internal Medicine

## 2016-12-10 ENCOUNTER — Encounter (HOSPITAL_COMMUNITY): Payer: Medicare Other

## 2016-12-12 ENCOUNTER — Encounter (HOSPITAL_COMMUNITY)
Admission: RE | Admit: 2016-12-12 | Discharge: 2016-12-12 | Disposition: A | Discharge status: Home / Self Care | Payer: Medicare Other | Source: Ambulatory Visit | Attending: Pulmonary Disease | Admitting: Pulmonary Disease

## 2016-12-12 VITALS — Wt 261.2 lb

## 2016-12-12 DIAGNOSIS — J9611 Chronic respiratory failure with hypoxia: Secondary | ICD-10-CM

## 2016-12-12 DIAGNOSIS — J438 Other emphysema: Secondary | ICD-10-CM | POA: Diagnosis not present

## 2016-12-12 DIAGNOSIS — D869 Sarcoidosis, unspecified: Secondary | ICD-10-CM

## 2016-12-12 NOTE — Progress Notes (Signed)
Daily Session Note  Patient Details  Name: Travis Ferguson MRN: [id number] Date of Birth: 11/10/1940 Referring Provider:     Pulmonary Rehab Walk Test from 10/17/2016 in [place]  Referring Provider  Dr. Halford Chessman      Encounter Date: 12/12/2016  Check In:     Session Check In - 12/12/16 1349      Check-In   Location [facility]   Staff Present Su Hilt, MS, ACSM RCEP, Exercise Physiologist;Kannon Granderson Leonia Reeves, RN, Luisa Hart, RN, BSN   Supervising physician immediately available to respond to emergencies [facility] immediately available   Physician(s) Dr. Allyson Sabal   Medication changes reported     No   Fall or balance concerns reported    No   Tobacco Cessation No Change   Warm-up and Cool-down Performed as group-led instruction   Resistance Training Performed Yes   VAD Patient? No     Pain Assessment   Currently in Pain? No/denies   Multiple Pain Sites No      Capillary Blood Glucose: No results found for this or any previous visit (from the past 24 hour(s)).      Exercise Prescription Changes - 12/12/16 1600      Response to Exercise   Blood Pressure (Admit) 104/60   Blood Pressure (Exercise) 110/60   Blood Pressure (Exit) 100/60   Heart Rate (Admit) 93 bpm   Heart Rate (Exercise) 100 bpm   Heart Rate (Exit) 62 bpm   Oxygen Saturation (Admit) 94 %   Oxygen Saturation (Exercise) 93 %   Oxygen Saturation (Exit) 93 %   Rating of Perceived Exertion (Exercise) 11   Perceived Dyspnea (Exercise) 0   Duration Progress to 45 minutes of aerobic exercise without signs/symptoms of physical distress   Intensity THRR unchanged     Progression   Progression Continue to progress workloads to maintain intensity without signs/symptoms of physical distress.     Resistance Training   Training Prescription Yes   Weight orange bands   Reps 10-15   Time --  10 minutes     Interval Training   Interval Training No      NuStep   Level 4   Minutes 17   METs 1.5     Track   Laps 10   Minutes 17      History  Smoking Status  . Former Smoker  . Packs/day: 0.00  . Years: 30.00  . Types: Pipe  . Quit date: 06/24/2008  Smokeless Tobacco  . Never Used    Goals Met:  Exercise tolerated well Strength training completed today  Goals Unmet:  Not Applicable  Comments: Service time is from 1330 to 1520    Dr. Rush Farmer is Medical Director for Pulmonary Rehab at [facility].

## 2016-12-17 ENCOUNTER — Encounter (HOSPITAL_COMMUNITY)
Admission: RE | Admit: 2016-12-17 | Discharge: 2016-12-17 | Disposition: A | Discharge status: Home / Self Care | Payer: Medicare Other | Source: Ambulatory Visit | Attending: Pulmonary Disease | Admitting: Pulmonary Disease

## 2016-12-17 VITALS — Wt 261.9 lb

## 2016-12-17 DIAGNOSIS — J438 Other emphysema: Secondary | ICD-10-CM | POA: Diagnosis not present

## 2016-12-17 NOTE — Progress Notes (Signed)
Daily Session Note  Patient Details  Name: Travis Ferguson MRN: [id number] Date of Birth: [birth year] Referring Provider:     Pulmonary Rehab Walk Test from 10/17/2016 in [place]  Referring Provider  Dr. Halford Chessman      Encounter Date: 12/17/2016  Check In:   Capillary Blood Glucose: No results found for this or any previous visit (from the past 24 hour(s)).      Exercise Prescription Changes - 12/17/16 1600      Response to Exercise   Blood Pressure (Admit) 110/70   Blood Pressure (Exercise) 116/72   Blood Pressure (Exit) 104/68   Heart Rate (Admit) 93 bpm   Heart Rate (Exercise) 102 bpm   Heart Rate (Exit) 93 bpm   Oxygen Saturation (Admit) 95 %   Oxygen Saturation (Exercise) 92 %   Oxygen Saturation (Exit) 96 %   Rating of Perceived Exertion (Exercise) 11   Perceived Dyspnea (Exercise) 1   Duration Progress to 45 minutes of aerobic exercise without signs/symptoms of physical distress   Intensity THRR unchanged     Progression   Progression Continue to progress workloads to maintain intensity without signs/symptoms of physical distress.     Resistance Training   Training Prescription Yes   Weight orange bands   Reps 10-15   Time 10 Minutes     Interval Training   Interval Training No     NuStep   Level 4   Minutes 17   METs 1.5     Track   Laps 9   Minutes 17      History  Smoking Status  . Former Smoker  . Packs/day: 0.00  . Years: 30.00  . Types: Pipe  . Quit date: 06/24/2008  Smokeless Tobacco  . Never Used    Goals Met:  No report of cardiac concerns or symptoms Strength training completed today  Goals Unmet:  Not Applicable  Comments: Service time is from 1330 to 1500    Dr. Rush Farmer is Medical Director for Pulmonary Rehab at [facility].

## 2016-12-19 ENCOUNTER — Encounter (HOSPITAL_COMMUNITY)
Admission: RE | Admit: 2016-12-19 | Discharge: 2016-12-19 | Disposition: A | Discharge status: Home / Self Care | Payer: Medicare Other | Source: Ambulatory Visit | Attending: Pulmonary Disease | Admitting: Pulmonary Disease

## 2016-12-19 VITALS — Wt 262.3 lb

## 2016-12-19 DIAGNOSIS — D869 Sarcoidosis, unspecified: Secondary | ICD-10-CM

## 2016-12-19 DIAGNOSIS — J438 Other emphysema: Secondary | ICD-10-CM | POA: Diagnosis not present

## 2016-12-19 DIAGNOSIS — J9611 Chronic respiratory failure with hypoxia: Secondary | ICD-10-CM

## 2016-12-19 NOTE — Progress Notes (Signed)
Daily Session Note  Patient Details  Name: Travis Ferguson MRN: [id number] Date of Birth: Jan 23, 1941 Referring Provider:     Pulmonary Rehab Walk Test from 10/17/2016 in [place]  Referring Provider  Dr. Halford Chessman      Encounter Date: 12/19/2016  Check In:     Session Check In - 12/19/16 1358      Check-In   Location [facility]   Staff Present Su Hilt, MS, ACSM RCEP, Exercise Physiologist;Akasia Ahmad Colletta [place], RN, [place] physician immediately available to respond to emergencies [facility] immediately available   Physician(s) Dr. Allyson Sabal   Medication changes reported     No   Fall or balance concerns reported    No   Tobacco Cessation No Change   Warm-up and Cool-down Performed as group-led instruction   Resistance Training Performed Yes   VAD Patient? No     Pain Assessment   Currently in Pain? No/denies   Multiple Pain Sites No      Capillary Blood Glucose: No results found for this or any previous visit (from the past 24 hour(s)).      Exercise Prescription Changes - 12/19/16 1600      Response to Exercise   Blood Pressure (Admit) 110/60   Blood Pressure (Exercise) 108/64   Blood Pressure (Exit) 104/70   Heart Rate (Admit) 92 bpm   Heart Rate (Exercise) 89 bpm   Heart Rate (Exit) 90 bpm   Oxygen Saturation (Admit) 96 %   Oxygen Saturation (Exercise) 96 %   Oxygen Saturation (Exit) 93 %   Rating of Perceived Exertion (Exercise) 11   Perceived Dyspnea (Exercise) 1   Duration Progress to 45 minutes of aerobic exercise without signs/symptoms of physical distress   Intensity THRR unchanged     Progression   Progression Continue to progress workloads to maintain intensity without signs/symptoms of physical distress.     Resistance Training   Training Prescription Yes   Weight orange bands   Reps 10-15   Time 10 Minutes     Interval Training   Interval Training No     NuStep   Level 4   Minutes 34   METs 1.5      History  Smoking Status  . Former Smoker  . Packs/day: 0.00  . Years: 30.00  . Types: Pipe  . Quit date: 06/24/2008  Smokeless Tobacco  . Never Used    Goals Met:  Exercise tolerated well No report of cardiac concerns or symptoms Strength training completed today  Goals Unmet:  Not Applicable  Comments: Service time is from 1330 to 1505    Dr. Rush Farmer is Medical Director for Pulmonary Rehab at [facility].

## 2016-12-24 ENCOUNTER — Encounter (HOSPITAL_COMMUNITY)
Admission: RE | Admit: 2016-12-24 | Discharge: 2016-12-24 | Disposition: A | Discharge status: Home / Self Care | Payer: Medicare Other | Source: Ambulatory Visit | Attending: Pulmonary Disease | Admitting: Pulmonary Disease

## 2016-12-24 VITALS — Wt 262.6 lb

## 2016-12-24 DIAGNOSIS — J438 Other emphysema: Secondary | ICD-10-CM | POA: Diagnosis present

## 2016-12-24 NOTE — Progress Notes (Signed)
Pulmonary Individual Treatment Plan  Patient Details  Name: Travis Ferguson MRN: [id number] Date of Birth: Oct 28, 1940 Referring Provider:     Pulmonary Rehab Walk Test from 10/17/2016 in [place] [facility]  Referring Provider  Dr. Halford Chessman      Initial Encounter Date:    Pulmonary Rehab Walk Test from 10/17/2016 in [place]  Date  10/18/16  Referring Provider  Dr. Halford Chessman      Visit Diagnosis: Other emphysema ([place])  Patient's Home Medications on Admission:   Current Outpatient Prescriptions:  .  budesonide-formoterol (SYMBICORT) 160-4.5 MCG/ACT inhaler, Inhale 2 puffs into the lungs 2 (two) times daily., Disp: 1 Inhaler, Rfl: 0 .  budesonide-formoterol (SYMBICORT) 160-4.5 MCG/ACT inhaler, Inhale 2 puffs into the lungs 2 (two) times daily., Disp: 2 Inhaler, Rfl: 0 .  DULoxetine (CYMBALTA) 30 MG capsule, Take 30 mg by mouth daily., Disp: , Rfl:  .  ENTRESTO 24-26 MG, TAKE 1 TABLET BY MOUTH 2 (TWO) TIMES DAILY., Disp: 180 tablet, Rfl: 1 .  finasteride (PROSCAR) 5 MG tablet, Take 5 mg by mouth daily. , Disp: , Rfl:  .  FLUoxetine (PROZAC) 40 MG capsule, Take 40 mg by mouth daily with breakfast. , Disp: , Rfl:  .  furosemide (LASIX) 20 MG tablet, Take 1 tablet (20 mg total) by mouth daily., Disp: 30 tablet, Rfl: 4 .  HYDROcodone-acetaminophen (NORCO/VICODIN) 5-325 MG tablet, Take 1 tablet by mouth every 6 (six) hours as needed for moderate pain., Disp: , Rfl:  .  ipratropium (ATROVENT) 0.02 % nebulizer solution, Take 2.5 mLs (0.5 mg total) by nebulization 4 (four) times daily., Disp: 300 mL, Rfl: 12 .  metoprolol tartrate (LOPRESSOR) 25 MG tablet, TAKE 1 TABLET (25 MG TOTAL) BY MOUTH 2 (TWO) TIMES DAILY., Disp: 90 tablet, Rfl: 1 .  Multiple Vitamin (MULTIVITAMIN) tablet, Take 1 tablet by mouth daily., Disp: , Rfl:  .  OXYGEN, Inhale 2 L/min into the lungs at bedtime., Disp: , Rfl:  .  PROAIR HFA 108 (90 BASE) MCG/ACT inhaler, 1-2 PUFFS  EVERY 4-6 HOURS AS NEEDED (Patient taking differently: 1-2 PUFFS EVERY 4-6 HOURS AS NEEDED for shortness of breath), Disp: 8.5 Inhaler, Rfl: 1 .  simvastatin (ZOCOR) 20 MG tablet, Take 20 mg by mouth every evening. , Disp: , Rfl:  .  sodium chloride (OCEAN) 0.65 % SOLN nasal spray, Place 1-2 sprays into both nostrils daily as needed for congestion., Disp: , Rfl:  .  spironolactone (ALDACTONE) 25 MG tablet, Take 1 tablet (25 mg total) by mouth daily., Disp: 30 tablet, Rfl: 4 .  SYMBICORT 160-4.5 MCG/ACT inhaler, INHALE 2 PUFFS INTO THE LUNGS 2 (TWO) TIMES DAILY., Disp: 20.4 g, Rfl: 5  Past Medical History: Past Medical History:  Diagnosis Date  . Acute on chronic combined systolic and diastolic CHF (congestive heart failure) ([place]) 02/24/2015  . Anxiety   . Arthritis    "about q joint I've got" (09/22/2015)  . Atherosclerosis   . Calculus of gallbladder with acute cholecystitis and obstruction   . CAP (community acquired pneumonia) 02/24/2015  . Cervical disc disease   . Chronic lower back pain    chronic back pain/under pain management  . Colon polyps    adenomatous and hyperplastic  . Colon polyps 08/02/2014   Tubular adenoma x 3, Hyperplastic-1  . COPD (chronic obstructive pulmonary disease) ([place])    a. Former Airline pilot, also smoked a pipe.  . Coronary artery calcification seen on CAT scan   .  Depression   . Diastolic dysfunction    grade 1 diastolic dysfunction on 2-D echo  . Dyslipidemia   . Dyspnea    chronic  . Encounter for biliary drainage tube placement    remains with drainage bag to right side for  biliary drainage.  . Frequent urination   . Gallstones   . Gangrenous cholecystitis 08/17/2014  . GERD (gastroesophageal reflux disease)   . Hiatal hernia    sensation problem ("right lateral side hip to knee").  . History of blood transfusion "numerous"  . History of oxygen administration    @ 2 l/m nasally at bedtime.(09/22/2015)  . Hypertension   . Inguinal hernia     right side  . Left ventricular dysfunction    ejection fraction of 25-30% with regional wall motion abnormalities.  . Migraine    "years since I've had one" (09/22/2015)  . OSA (obstructive sleep apnea)    does not use CPAP; "just oxygen" (09/22/2015)  . Pneumonia ~ 1958  . PTSD (post-traumatic stress disorder)   . Rhinitis   . Sarcoidosis   . Sensation problem    right side-lateral hip to knee" decreased sensation and tingling feeling" -has informed Dr. Joylene Draft, Dr. Henrene Pastor, Dr. Lucia Gaskins of this.  . Sepsis ([place])   . Sinus congestion    10-27-14 at present some issues-"not bad"  . Steroid-induced hyperglycemia 02/25/2015  . Subdural hematoma ([place]) 11/10   a. 2010 s/p surgery - diagnosed several weeks after a fall.    Tobacco Use: History  Smoking Status  . Former Smoker  . Packs/day: 0.00  . Years: 30.00  . Types: Pipe  . Quit date: 06/24/2008  Smokeless Tobacco  . Never Used    Labs: Recent Review Flowsheet Data    Labs for ITP Cardiac and Pulmonary Rehab Latest Ref Rng & Units 05/11/2009 08/19/2014 01/23/2015 01/23/2015 02/24/2015   PHART 7.350 - 7.450 7.375 7.373 7.409 - 7.295(L)   PCO2ART 35.0 - 45.0 mmHg 36.6 33.5(L) 34.6(L) - 44.0   HCO3 20.0 - 24.0 mEq/L 20.9 19.1(L) 21.9 23.7 20.8   TCO2 0 - 100 mmol/L 22.0 16.9 23 25  18.7   ACIDBASEDEF 0.0 - 2.0 mmol/L 3.4(H) 4.8(H) 2.0 1.0 5.2(H)   O2SAT % 98.9 93.4 94.0 68.0 99.5      Capillary Blood Glucose: Lab Results  Component Value Date   GLUCAP 121 (H) 02/27/2015   GLUCAP 170 (H) 02/26/2015   GLUCAP 133 (H) 02/26/2015   GLUCAP 146 (H) 02/26/2015   GLUCAP 153 (H) 02/26/2015     ADL UCSD:     Pulmonary Assessment Scores    Row Name 11/27/16 1410 11/27/16 1411       ADL UCSD   ADL Phase Entry  -    SOB Score total 14  -      CAT Score   CAT Score  - 22  Pre       Pulmonary Function Assessment:     Pulmonary Function Assessment - 10/14/16 1245      Breath   Bilateral Breath Sounds Clear   Shortness of Breath  No;Limiting activity      Exercise Target Goals:    Exercise Program Goal: Individual exercise prescription set with THRR, safety & activity barriers. Participant demonstrates ability to understand and report RPE using BORG scale, to self-measure pulse accurately, and to acknowledge the importance of the exercise prescription.  Exercise Prescription Goal: Starting with aerobic activity 30 plus minutes a day, 3 days per week for initial  exercise prescription. Provide home exercise prescription and guidelines that participant acknowledges understanding prior to discharge.  Activity Barriers & Risk Stratification:   6 Minute Walk:     6 Minute Walk    Row Name 10/18/16 0822         6 Minute Walk   Phase Initial     Distance 1030 feet     Walk Time 6 minutes     # of Rest Breaks 0     MPH 1.95     METS 2.53     RPE 11     Perceived Dyspnea  1     Symptoms Yes (comment)     Comments Chronic pain     Resting HR 67 bpm     Resting BP 100/70     Max Ex. HR 109 bpm     Max Ex. BP 132/80       Interval HR   Baseline HR 67     1 Minute HR 105     2 Minute HR 88     3 Minute HR 107     4 Minute HR 89     5 Minute HR 101     6 Minute HR 109     2 Minute Post HR 100     Interval Heart Rate? Yes       Interval Oxygen   Interval Oxygen? Yes     Baseline Oxygen Saturation % 92 %     Baseline Liters of Oxygen 0 L     1 Minute Oxygen Saturation % 90 %     1 Minute Liters of Oxygen 0 L     2 Minute Oxygen Saturation % 89 %     2 Minute Liters of Oxygen 0 L     3 Minute Oxygen Saturation % 89 %     3 Minute Liters of Oxygen 0 L     4 Minute Oxygen Saturation % 89 %     4 Minute Liters of Oxygen 0 L     5 Minute Oxygen Saturation % 88 %     5 Minute Liters of Oxygen 0 L     6 Minute Oxygen Saturation % 89 %     6 Minute Liters of Oxygen 0 L     2 Minute Post Oxygen Saturation % 92 %     2 Minute Post Liters of Oxygen 0 L        Oxygen Initial Assessment:      Oxygen Initial Assessment - 10/18/16 1114      Initial 6 min Walk   Oxygen Used None   Resting Oxygen Saturation  during 6 min walk 92 %   Exercise Oxygen Saturation  during 6 min walk 88 %      Oxygen Re-Evaluation:     Oxygen Re-Evaluation    Row Name 10/29/16 0914 10/29/16 0915 11/26/16 0945 12/24/16 0949       Program Oxygen Prescription   Program Oxygen Prescription None  - None None      Home Oxygen   Home Oxygen Device  - Home Concentrator;E-Tanks Home Concentrator;E-Tanks  sleeps with oxygen Home Concentrator;E-Tanks    Sleep Oxygen Prescription  - Continuous Continuous Continuous    Liters per minute  - 2 2 2     Home Exercise Oxygen Prescription  - None None None    Home at Rest Exercise Oxygen Prescription  - None None None    Compliance with Home  Oxygen Use  - Yes Yes Yes      Goals/Expected Outcomes   Short Term Goals  -  - To learn and exhibit compliance with exercise, home and travel O2 prescription To learn and exhibit compliance with exercise, home and travel O2 prescription    Long  Term Goals  - Exhibits compliance with exercise, home and travel O2 prescription Exhibits compliance with exercise, home and travel O2 prescription Exhibits compliance with exercise, home and travel O2 prescription    Comments  - follows home oxygen when sleeping at night compliant with oxygen at night compliant with oxygen at night    Goals/Expected Outcomes  - continued compliance continued compliance continued compliance       Oxygen Discharge (Final Oxygen Re-Evaluation):     Oxygen Re-Evaluation - 12/24/16 0949      Program Oxygen Prescription   Program Oxygen Prescription None     Home Oxygen   Home Oxygen Device Home Concentrator;E-Tanks   Sleep Oxygen Prescription Continuous   Liters per minute 2   Home Exercise Oxygen Prescription None   Home at Rest Exercise Oxygen Prescription None   Compliance with Home Oxygen Use Yes     Goals/Expected Outcomes    Short Term Goals To learn and exhibit compliance with exercise, home and travel O2 prescription   Long  Term Goals Exhibits compliance with exercise, home and travel O2 prescription   Comments compliant with oxygen at night   Goals/Expected Outcomes continued compliance      Initial Exercise Prescription:     Initial Exercise Prescription - 10/18/16 0800      Date of Initial Exercise RX and Referring Provider   Date 10/18/16   Referring Provider Dr. Halford Chessman     NuStep   Level 2   Minutes 34   METs 1.5     Track   Laps 5   Minutes 17     Prescription Details   Frequency (times per week) 2   Duration Progress to 45 minutes of aerobic exercise without signs/symptoms of physical distress     Intensity   THRR 40-80% of Max Heartrate 58-115   Ratings of Perceived Exertion 11-13   Perceived Dyspnea 0-4     Progression   Progression Continue progressive overload as per policy without signs/symptoms or physical distress.     Resistance Training   Training Prescription Yes   Weight orange bands   Reps 10-15      Perform Capillary Blood Glucose checks as needed.  Exercise Prescription Changes:     Exercise Prescription Changes    Row Name 10/24/16 1500 10/29/16 1602 11/05/16 1513 11/07/16 1500 11/14/16 1611     Response to Exercise   Blood Pressure (Admit) 96/60 108/64 102/70 100/60 90/50   Blood Pressure (Exercise) 120/70 124/70 120/76 100/66 108/60   Blood Pressure (Exit) 110/60 112/60 112/72 100/62 100/60   Heart Rate (Admit) 102 bpm 91 bpm 91 bpm 99 bpm 72 bpm   Heart Rate (Exercise) 109 bpm 111 bpm 91 bpm 106 bpm 87 bpm   Heart Rate (Exit) 105 bpm 93 bpm 87 bpm 98 bpm 72 bpm   Oxygen Saturation (Admit) 95 % 96 % 96 % 92 % 98 %   Oxygen Saturation (Exercise) 95 % 91 % 93 % 92 % 97 %   Oxygen Saturation (Exit) 95 % 95 % 96 % 94 % 97 %   Rating of Perceived Exertion (Exercise) 11 11 11 11 12    Perceived Dyspnea (Exercise)  2 0 0 0 1   Duration Progress to 45  minutes of aerobic exercise without signs/symptoms of physical distress Progress to 45 minutes of aerobic exercise without signs/symptoms of physical distress Progress to 45 minutes of aerobic exercise without signs/symptoms of physical distress Progress to 45 minutes of aerobic exercise without signs/symptoms of physical distress Progress to 45 minutes of aerobic exercise without signs/symptoms of physical distress   Intensity  -  -  - THRR unchanged THRR unchanged     Progression   Progression Continue to progress workloads to maintain intensity without signs/symptoms of physical distress. Continue to progress workloads to maintain intensity without signs/symptoms of physical distress. Continue to progress workloads to maintain intensity without signs/symptoms of physical distress. Continue to progress workloads to maintain intensity without signs/symptoms of physical distress. Continue to progress workloads to maintain intensity without signs/symptoms of physical distress.     Resistance Training   Training Prescription Yes Yes Yes Yes Yes   Weight orange bands orange bands orange bands orange bands orange bands   Reps 10-15 10-15 10-15 10-15 10-15   Time  -  -  - -  10 minutes -  10 minutes     Interval Training   Interval Training  -  -  - No No     NuStep   Level 2 3 3 3   -   Minutes 17 17 34 17  -   METs 1.5 1.7 1.5 1.5  -     Track   Laps 10 10 9 8 8    Minutes 17 17 17 17  34   Row Name 11/19/16 1500 11/21/16 1600 11/28/16 1456 12/03/16 1619 12/05/16 1603     Response to Exercise   Blood Pressure (Admit) 100/70 98/66 110/66 100/60 97/58   Blood Pressure (Exercise) 114/60 132/74 102/60 122/66 110/66   Blood Pressure (Exit) 98/62 102/64 102/60 110/62 104/64   Heart Rate (Admit) 98 bpm 80 bpm 86 bpm 90 bpm 87 bpm   Heart Rate (Exercise) 95 bpm 89 bpm 71 bpm 91 bpm 90 bpm   Heart Rate (Exit) 82 bpm 83 bpm 82 bpm 83 bpm 93 bpm   Oxygen Saturation (Admit) 94 % 94 % 94 % 94 % 94 %    Oxygen Saturation (Exercise) 91 % 92 % 94 % 91 % 95 %   Oxygen Saturation (Exit) 93 % 93 % 96 % 95 % 94 %   Rating of Perceived Exertion (Exercise) 13 12 11 13 11    Perceived Dyspnea (Exercise) 0 1 0 0 0   Duration Progress to 45 minutes of aerobic exercise without signs/symptoms of physical distress Progress to 45 minutes of aerobic exercise without signs/symptoms of physical distress Progress to 45 minutes of aerobic exercise without signs/symptoms of physical distress Progress to 45 minutes of aerobic exercise without signs/symptoms of physical distress Progress to 45 minutes of aerobic exercise without signs/symptoms of physical distress   Intensity THRR unchanged THRR unchanged THRR unchanged THRR unchanged THRR unchanged     Progression   Progression Continue to progress workloads to maintain intensity without signs/symptoms of physical distress. Continue to progress workloads to maintain intensity without signs/symptoms of physical distress. Continue to progress workloads to maintain intensity without signs/symptoms of physical distress. Continue to progress workloads to maintain intensity without signs/symptoms of physical distress. Continue to progress workloads to maintain intensity without signs/symptoms of physical distress.     Resistance Training   Training Prescription Yes Yes Yes Yes Yes   Weight  orange bands orange bands orange bands orange bands orange bands   Reps 10-15 10-15 10-15 10-15 10-15   Time -  10 minutes -  10 minutes -  10 minutes -  10 minutes -  10 minutes     Interval Training   Interval Training No No No No No     NuStep   Level 4 4 4 4 4    Minutes 34 17 51 34 34   METs 1.4 1.5 1.6 1.5 1.6     Track   Laps 7 6  - 7  -   Minutes 17 17  - 17  -     Home Exercise Plan   Plans to continue exercise at  -  - Home (comment)  -  -   Frequency  -  - Add 1 additional day to program exercise sessions.  -  -   Row Name 12/12/16 1600 12/17/16 1600 12/19/16  1600         Response to Exercise   Blood Pressure (Admit) 104/60 110/70 110/60     Blood Pressure (Exercise) 110/60 116/72 108/64     Blood Pressure (Exit) 100/60 104/68 104/70     Heart Rate (Admit) 93 bpm 93 bpm 92 bpm     Heart Rate (Exercise) 100 bpm 102 bpm 89 bpm     Heart Rate (Exit) 62 bpm 93 bpm 90 bpm     Oxygen Saturation (Admit) 94 % 95 % 96 %     Oxygen Saturation (Exercise) 93 % 92 % 96 %     Oxygen Saturation (Exit) 93 % 96 % 93 %     Rating of Perceived Exertion (Exercise) 11 11 11      Perceived Dyspnea (Exercise) 0 1 1     Duration Progress to 45 minutes of aerobic exercise without signs/symptoms of physical distress Progress to 45 minutes of aerobic exercise without signs/symptoms of physical distress Progress to 45 minutes of aerobic exercise without signs/symptoms of physical distress     Intensity THRR unchanged THRR unchanged THRR unchanged       Progression   Progression Continue to progress workloads to maintain intensity without signs/symptoms of physical distress. Continue to progress workloads to maintain intensity without signs/symptoms of physical distress. Continue to progress workloads to maintain intensity without signs/symptoms of physical distress.       Resistance Training   Training Prescription Yes Yes Yes     Weight orange bands orange bands orange bands     Reps 10-15 10-15 10-15     Time -  10 minutes 10 Minutes 10 Minutes       Interval Training   Interval Training No No No       NuStep   Level 4 4 4      Minutes 17 17 34     METs 1.5 1.5 1.5       Track   Laps 10 9  -     Minutes 17 17  -        Exercise Comments:     Exercise Comments    Row Name 11/28/16 1559           Exercise Comments Home exercise completed          Exercise Goals and Review:   Exercise Goals Re-Evaluation :     Exercise Goals Re-Evaluation    Row Name 10/28/16 1259 11/25/16 1146 12/23/16 1113         Exercise Goal Re-Evaluation  Exercise Goals Review Increase Physical Activity;Increase Strenth and Stamina Increase Physical Activity;Increase Strenth and Stamina Increase Physical Activity;Increase Strenth and Stamina     Comments Patient has only attended one exercise session. Will cont. to monitor and progress as able.  Patient is progressing low and slow. MET levels average about 1.5. Patient is limited by chronic pain. Will cont. to monitor and progress as able.  Patient is progressing low and slow. MET levels average about 1.5. Patient is limited by chronic pain. Will cont. to monitor and progress as able.      Expected Outcomes Through exercising at rehab and at home, the patient will be able to increase strength and stamina which will make ADL's easier to preform. Through exercising at rehab and at home, patient will increase physical strength and stamina and find ADL's easier to perform.  Through exercising at rehab and at home, patient will increase physical strength and stamina and find ADL's easier to perform.         Discharge Exercise Prescription (Final Exercise Prescription Changes):     Exercise Prescription Changes - 12/19/16 1600      Response to Exercise   Blood Pressure (Admit) 110/60   Blood Pressure (Exercise) 108/64   Blood Pressure (Exit) 104/70   Heart Rate (Admit) 92 bpm   Heart Rate (Exercise) 89 bpm   Heart Rate (Exit) 90 bpm   Oxygen Saturation (Admit) 96 %   Oxygen Saturation (Exercise) 96 %   Oxygen Saturation (Exit) 93 %   Rating of Perceived Exertion (Exercise) 11   Perceived Dyspnea (Exercise) 1   Duration Progress to 45 minutes of aerobic exercise without signs/symptoms of physical distress   Intensity THRR unchanged     Progression   Progression Continue to progress workloads to maintain intensity without signs/symptoms of physical distress.     Resistance Training   Training Prescription Yes   Weight orange bands   Reps 10-15   Time 10 Minutes     Interval Training    Interval Training No     NuStep   Level 4   Minutes 34   METs 1.5      Nutrition:  Target Goals: Understanding of nutrition guidelines, daily intake of sodium <1572m, cholesterol <2028m calories 30% from fat and 7% or less from saturated fats, daily to have 5 or more servings of fruits and vegetables.  Biometrics:     Pre Biometrics - 10/14/16 1251      Pre Biometrics   Grip Strength 25 kg       Nutrition Therapy Plan and Nutrition Goals:   Nutrition Discharge: Rate Your Plate Scores:   Nutrition Goals Re-Evaluation:   Nutrition Goals Discharge (Final Nutrition Goals Re-Evaluation):   Psychosocial: Target Goals: Acknowledge presence or absence of significant depression and/or stress, maximize coping skills, provide positive support system. Participant is able to verbalize types and ability to use techniques and skills needed for reducing stress and depression.  Initial Review & Psychosocial Screening:     Initial Psych Review & Screening - 10/14/16 1254      Initial Review   Current issues with None Identified     Family Dynamics   Good Support System? Yes     Barriers   Psychosocial barriers to participate in program There are no identifiable barriers or psychosocial needs.     Screening Interventions   Interventions Encouraged to exercise      Quality of Life Scores:   PHQ-9: Recent Review Flowsheet Data  Depression screen [facility] 2/9 10/14/2016 10/30/2015 06/12/2015 03/13/2015 12/23/2014   Decreased Interest 0 0 0 0 0   Down, Depressed, Hopeless 0 0 3 1 1    PHQ - 2 Score 0 0 3 1 1    Altered sleeping - - 3 - -   Tired, decreased energy - - 3 - -   Change in appetite - - 0 - -   Feeling bad or failure about yourself  - - 0 - -   Trouble concentrating - - 0 - -   Moving slowly or fidgety/restless - - 0 - -   Suicidal thoughts - - 0 - -   PHQ-9 Score - - 9 - -   Difficult doing work/chores - - Somewhat difficult - -     Interpretation of Total  Score  Total Score Depression Severity:  1-4 = Minimal depression, 5-9 = Mild depression, 10-14 = Moderate depression, 15-19 = Moderately severe depression, 20-27 = Severe depression   Psychosocial Evaluation and Intervention:     Psychosocial Evaluation - 10/14/16 1255      Psychosocial Evaluation & Interventions   Interventions Encouraged to exercise with the program and follow exercise prescription   Continue Psychosocial Services  No Follow up required      Psychosocial Re-Evaluation:     Psychosocial Re-Evaluation    Row Name 10/29/16 [phone] 11/26/16 0950 12/24/16 0951         Psychosocial Re-Evaluation   Current issues with None Identified None Identified None Identified     Interventions Encouraged to attend Pulmonary Rehabilitation for the exercise Encouraged to attend Pulmonary Rehabilitation for the exercise Encouraged to attend Pulmonary Rehabilitation for the exercise     Continue Psychosocial Services  No Follow up required No Follow up required No Follow up required        Psychosocial Discharge (Final Psychosocial Re-Evaluation):     Psychosocial Re-Evaluation - 12/24/16 0951      Psychosocial Re-Evaluation   Current issues with None Identified   Interventions Encouraged to attend Pulmonary Rehabilitation for the exercise   Continue Psychosocial Services  No Follow up required      Education: Education Goals: Education classes will be provided on a weekly basis, covering required topics. Participant will state understanding/return demonstration of topics presented.  Learning Barriers/Preferences:     Learning Barriers/Preferences - 10/14/16 1244      Learning Barriers/Preferences   Learning Barriers None   Learning Preferences Written Material;Video;Pictoral;Computer/Internet      Education Topics: Risk Factor Reduction:  -Group instruction that is supported by a PowerPoint presentation. Instructor discusses the definition of a risk factor,  different risk factors for pulmonary disease, and how the heart and lungs work together.     Nutrition for Pulmonary Patient:  -Group instruction provided by PowerPoint slides, verbal discussion, and written materials to support subject matter. The instructor gives an explanation and review of healthy diet recommendations, which includes a discussion on weight management, recommendations for fruit and vegetable consumption, as well as protein, fluid, caffeine, fiber, sodium, sugar, and alcohol. Tips for eating when patients are short of breath are discussed.   PULMONARY REHAB OTHER RESPIRATORY from 12/19/2016 in [place]  Date  12/12/16  Educator  [place]  Instruction Review Code  2- meets goals/outcomes      Pursed Lip Breathing:  -Group instruction that is supported by demonstration and informational handouts. Instructor discusses the benefits of pursed lip and diaphragmatic breathing and detailed demonstration on how  to preform both.     PULMONARY REHAB OTHER RESPIRATORY from 01/18/2016 in [place]  Date  01/18/16  Educator  EP  Instruction Review Code  R- Review/reinforce      Oxygen Safety:  -Group instruction provided by PowerPoint, verbal discussion, and written material to support subject matter. There is an overview of "What is Oxygen" and "Why do we need it".  Instructor also reviews how to create a safe environment for oxygen use, the importance of using oxygen as prescribed, and the risks of noncompliance. There is a brief discussion on traveling with oxygen and resources the patient may utilize.   PULMONARY REHAB OTHER RESPIRATORY from 12/19/2016 in [place]  Date  12/05/16  Educator  Truddie Crumble  Instruction Review Code  2- meets goals/outcomes      Oxygen Equipment:  -Group instruction provided by [employer] Staff utilizing handouts, written materials, and equipment  demonstrations.   PULMONARY REHAB OTHER RESPIRATORY from 12/19/2016 in [place]  Date  11/14/16  Educator  Ace Gins  Instruction Review Code  2- meets goals/outcomes      Signs and Symptoms:  -Group instruction provided by written material and verbal discussion to support subject matter. Warning signs and symptoms of infection, stroke, and heart attack are reviewed and when to call the physician/911 reinforced. Tips for preventing the spread of infection discussed.   PULMONARY REHAB OTHER RESPIRATORY from 12/19/2016 in [place]  Date  10/24/16  Educator  RN  Instruction Review Code  2- meets goals/outcomes      Advanced Directives:  -Group instruction provided by verbal instruction and written material to support subject matter. Instructor reviews Advanced Directive laws and proper instruction for filling out document.   Pulmonary Video:  -Group video education that reviews the importance of medication and oxygen compliance, exercise, good nutrition, pulmonary hygiene, and pursed lip and diaphragmatic breathing for the pulmonary patient.   PULMONARY REHAB OTHER RESPIRATORY from 01/18/2016 in [place]  Date  11/30/15  Instruction Review Code  2- meets goals/outcomes      Exercise for the Pulmonary Patient:  -Group instruction that is supported by a PowerPoint presentation. Instructor discusses benefits of exercise, core components of exercise, frequency, duration, and intensity of an exercise routine, importance of utilizing pulse oximetry during exercise, safety while exercising, and options of places to exercise outside of rehab.     PULMONARY REHAB OTHER RESPIRATORY from 12/19/2016 in [place]  Date  12/19/16  Educator  EP  Instruction Review Code  2- meets goals/outcomes      Pulmonary Medications:  -Verbally interactive group education provided  by instructor with focus on inhaled medications and proper administration.   PULMONARY REHAB OTHER RESPIRATORY from 12/19/2016 in [place]  Date  11/07/16  Educator  Pharm  Instruction Review Code  2- meets goals/outcomes      Anatomy and Physiology of the Respiratory System and Intimacy:  -Group instruction provided by PowerPoint, verbal discussion, and written material to support subject matter. Instructor reviews respiratory cycle and anatomical components of the respiratory system and their functions. Instructor also reviews differences in obstructive and restrictive respiratory diseases with examples of each. Intimacy, Sex, and Sexuality differences are reviewed with a discussion on how relationships can change when diagnosed with pulmonary disease. Common sexual concerns are reviewed.   PULMONARY REHAB OTHER RESPIRATORY  from 01/18/2016 in [place]  Date  01/11/16  Educator  RN  Instruction Review Code  2- meets goals/outcomes      MD DAY -A group question and answer session with a medical doctor that allows participants to ask questions that relate to their pulmonary disease state.   OTHER EDUCATION -Group or individual verbal, written, or video instructions that support the educational goals of the pulmonary rehab program.   Knowledge Questionnaire Score:     Knowledge Questionnaire Score - 11/27/16 1410      Knowledge Questionnaire Score   Pre Score 11/13      Core Components/Risk Factors/Patient Goals at Admission:     Personal Goals and Risk Factors at Admission - 10/14/16 1253      Core Components/Risk Factors/Patient Goals on Admission   Improve shortness of breath with ADL's Yes   Intervention Provide education, individualized exercise plan and daily activity instruction to help decrease symptoms of SOB with activities of daily living.   Expected Outcomes Short Term: Achieves a reduction of  symptoms when performing activities of daily living.   Develop more efficient breathing techniques such as purse lipped breathing and diaphragmatic breathing; and practicing self-pacing with activity Yes   Intervention Provide education, demonstration and support about specific breathing techniuqes utilized for more efficient breathing. Include techniques such as pursed lipped breathing, diaphragmatic breathing and self-pacing activity.   Expected Outcomes Short Term: Participant will be able to demonstrate and use breathing techniques as needed throughout daily activities.   Increase knowledge of respiratory medications and ability to use respiratory devices properly  Yes   Intervention Provide education and demonstration as needed of appropriate use of medications, inhalers, and oxygen therapy.   Expected Outcomes Short Term: Achieves understanding of medications use. Understands that oxygen is a medication prescribed by physician. Demonstrates appropriate use of inhaler and oxygen therapy.      Core Components/Risk Factors/Patient Goals Review:      Goals and Risk Factor Review    Row Name 10/29/16 [phone] 11/26/16 0947 12/24/16 0949         Core Components/Risk Factors/Patient Goals Review   Personal Goals Review Develop more efficient breathing techniques such as purse lipped breathing and diaphragmatic breathing and practicing self-pacing with activity.;Increase knowledge of respiratory medications and ability to use respiratory devices properly.;Improve shortness of breath with ADL's Develop more efficient breathing techniques such as purse lipped breathing and diaphragmatic breathing and practicing self-pacing with activity.;Improve shortness of breath with ADL's;Increase knowledge of respiratory medications and ability to use respiratory devices properly. Develop more efficient breathing techniques such as purse lipped breathing and diaphragmatic breathing and practicing self-pacing with  activity.;Improve shortness of breath with ADL's;Increase knowledge of respiratory medications and ability to use respiratory devices properly.     Review has only attended 1 exercise session, too early to see progression to goals progressing with workload increases, does well in a group setting to keep him occupied 2 days per week experiences chronic pain in shoulder, back, and legs.  Due to pain his progression is slow, but he is benefiting from the social aspect of the program     Expected Outcomes should see improvement in the next full 30 days continue to increase workloads as tolerated, continue with group support  continue to increase workloads as tolerated, continue with group support         Core Components/Risk Factors/Patient Goals at Discharge (Final Review):      Goals and Risk Factor  Review - 12/24/16 0949      Core Components/Risk Factors/Patient Goals Review   Personal Goals Review Develop more efficient breathing techniques such as purse lipped breathing and diaphragmatic breathing and practicing self-pacing with activity.;Improve shortness of breath with ADL's;Increase knowledge of respiratory medications and ability to use respiratory devices properly.   Review experiences chronic pain in shoulder, back, and legs.  Due to pain his progression is slow, but he is benefiting from the social aspect of the program   Expected Outcomes continue to increase workloads as tolerated, continue with group support       ITP Comments:   Comments: ITP REVIEW Pt is making expected progress toward pulmonary rehab goals after completing 13 sessions. Recommend continued exercise, life style modification, education, and utilization of breathing techniques to increase stamina and strength and decrease shortness of breath with exertion.

## 2016-12-24 NOTE — Progress Notes (Signed)
Daily Session Note  Patient Details  Name: Travis Ferguson MRN: [id number] Date of Birth: 07-Jul-1940 Referring Provider:     Pulmonary Rehab Walk Test from 10/17/2016 in [place]  Referring Provider  Dr. Halford Chessman      Encounter Date: 12/24/2016  Check In:     Session Check In - 12/24/16 1330      Check-In   Location [facility]   Staff Present Su Hilt, MS, ACSM RCEP, Exercise Physiologist;Aristides Luckey Ysidro Evert, Felipe Drone, RN, MHA;Portia Rollene Rotunda, RN, BSN   Supervising physician immediately available to respond to emergencies [facility] immediately available   Physician(s) Dr. Allyson Sabal   Medication changes reported     No   Fall or balance concerns reported    No   Tobacco Cessation No Change   Warm-up and Cool-down Performed as group-led instruction   Resistance Training Performed Yes   VAD Patient? No     Pain Assessment   Currently in Pain? No/denies   Multiple Pain Sites No      Capillary Blood Glucose: No results found for this or any previous visit (from the past 24 hour(s)).      Exercise Prescription Changes - 12/24/16 1500      Response to Exercise   Blood Pressure (Admit) 110/56   Blood Pressure (Exercise) 120/64   Blood Pressure (Exit) 112/64   Heart Rate (Admit) 99 bpm   Heart Rate (Exercise) 102 bpm   Heart Rate (Exit) 99 bpm   Oxygen Saturation (Admit) 95 %   Oxygen Saturation (Exercise) 92 %   Oxygen Saturation (Exit) 93 %   Rating of Perceived Exertion (Exercise) 11   Perceived Dyspnea (Exercise) 1   Duration Progress to 45 minutes of aerobic exercise without signs/symptoms of physical distress   Intensity THRR unchanged     Progression   Progression Continue to progress workloads to maintain intensity without signs/symptoms of physical distress.     Resistance Training   Training Prescription Yes   Weight orange bands   Reps 10-15   Time 10 Minutes     Interval Training   Interval Training No     NuStep   Level 4   Minutes 34   METs 1.4     Track   Laps 10   Minutes 17      History  Smoking Status  . Former Smoker  . Packs/day: 0.00  . Years: 30.00  . Types: Pipe  . Quit date: 06/24/2008  Smokeless Tobacco  . Never Used    Goals Met:  Exercise tolerated well No report of cardiac concerns or symptoms Strength training completed today  Goals Unmet:  Not Applicable  Comments: Service time is from 1330 to 1455    Dr. Rush Farmer is Medical Director for Pulmonary Rehab at [facility].

## 2016-12-24 NOTE — Progress Notes (Signed)
Travis Ferguson 76 y.o. male 27-Jun-1940   Nutrition Note Spoke with pt. Pt well-known to me from previous admission. Pt is obese. Pt reports he wants to lose wt but is not actively trying to lose wt. Pt has maintained his wt around 256-264 lb over the past year. Per discussion, pt continues to feel his mortality is limited given his medical history. Pt continues to be in the pre-contemplative state of change re: wt loss/improving eating habits. There are some ways the pt can make his eating habits healthier.  Pt's Rate Your Plate results reviewed with pt. Pt states he has started to look at sodium content of food and tries to avoid salty food, which is an improvement from previous admission. Pt expressed understanding of the information reviewed via feedback method.    Nutrition Diagnosis   Food-and nutrition-related knowledge deficit related to lack of exposure to information as related to diagnosis of pulmonary disease  Obesity related to excessive energy intake as evidenced by a BMI of 35.5   Nutrition Intervention   Pt's individual nutrition plan and goals reviewed with pt.  Benefits of adopting healthy eating habits discussed when pt's Rate Your Plate reviewed.  Pt to attend the Nutrition and Lung Disease class   Continual client-centered nutrition education by RD, as part of interdisciplinary care.  Goal(s) 1. Identify food quantities necessary to achieve wt loss of  -2# per week to a goal wt loss of 2.7-10.9 kg (6-24 lb) at graduation from pulmonary rehab.  Monitor and Evaluate progress toward nutrition goal with team.  Jewel Baize, RD 12/24/2016 3:47 PM.

## 2016-12-26 ENCOUNTER — Encounter (HOSPITAL_COMMUNITY)
Admission: RE | Admit: 2016-12-26 | Discharge: 2016-12-26 | Disposition: A | Discharge status: Home / Self Care | Payer: Medicare Other | Source: Ambulatory Visit | Attending: Pulmonary Disease | Admitting: Pulmonary Disease

## 2016-12-26 DIAGNOSIS — J438 Other emphysema: Secondary | ICD-10-CM | POA: Diagnosis not present

## 2016-12-26 DIAGNOSIS — D869 Sarcoidosis, unspecified: Secondary | ICD-10-CM

## 2016-12-26 DIAGNOSIS — J9611 Chronic respiratory failure with hypoxia: Secondary | ICD-10-CM

## 2016-12-26 NOTE — Progress Notes (Signed)
Daily Session Note  Patient Details  Name: Travis Ferguson MRN: [id number] Date of Birth: 07/18/1940 Referring Provider:     Pulmonary Rehab Walk Test from 10/17/2016 in [facility]  Referring Provider  Dr. Sood      Encounter Date: 12/24/2016  Check In:     Session Check In - 12/26/16 1328      Check-In   Location [facility]   Staff Present  , RN, BSN;Joan Behrens, RN, BSN;Molly diVincenzo, MS, ACSM RCEP, Exercise Physiologist;Lisa Hughes, RN   Supervising physician immediately available to respond to emergencies [facility] immediately available   Physician(s) Dr. Vega   Medication changes reported     No   Fall or balance concerns reported    No   Tobacco Cessation No Change   Warm-up and Cool-down Performed as group-led instruction   Resistance Training Performed Yes   VAD Patient? No     Pain Assessment   Currently in Pain? No/denies   Multiple Pain Sites No      Capillary Blood Glucose: No results found for this or any previous visit (from the past 24 hour(s)).      Exercise Prescription Changes - 12/26/16 1544      Response to Exercise   Blood Pressure (Admit) 110/62   Blood Pressure (Exercise) 140/80   Blood Pressure (Exit) 110/70   Heart Rate (Admit) 83 bpm   Heart Rate (Exercise) 96 bpm   Heart Rate (Exit) 57 bpm   Oxygen Saturation (Admit) 94 %   Oxygen Saturation (Exercise) 94 %   Oxygen Saturation (Exit) 94 %   Rating of Perceived Exertion (Exercise) 11   Perceived Dyspnea (Exercise) 0   Duration Progress to 45 minutes of aerobic exercise without signs/symptoms of physical distress   Intensity THRR unchanged     Progression   Progression Continue to progress workloads to maintain intensity without signs/symptoms of physical distress.     Resistance Training   Training Prescription Yes   Weight orange bands   Reps 10-15   Time 10 Minutes     Interval Training   Interval  Training No     NuStep   Level 6   Minutes 17   METs 1.6     Track   Laps 15   Minutes 17      History  Smoking Status  . Former Smoker  . Packs/day: 0.00  . Years: 30.00  . Types: Pipe  . Quit date: 06/24/2008  Smokeless Tobacco  . Never Used    Goals Met:  Independence with exercise equipment Improved SOB with ADL's Using PLB without cueing & demonstrates good technique Exercise tolerated well No report of cardiac concerns or symptoms Strength training completed today  Goals Unmet:  Not Applicable  Comments: Service time is from 1330 to 1500   Dr. Wesam G. Yacoub is Medical Director for Pulmonary Rehab at [facility]. 

## 2016-12-31 ENCOUNTER — Encounter (HOSPITAL_COMMUNITY)
Admission: RE | Admit: 2016-12-31 | Discharge: 2016-12-31 | Disposition: A | Discharge status: Home / Self Care | Payer: Medicare Other | Source: Ambulatory Visit | Attending: Pulmonary Disease | Admitting: Pulmonary Disease

## 2016-12-31 VITALS — Wt 263.0 lb

## 2016-12-31 DIAGNOSIS — J438 Other emphysema: Secondary | ICD-10-CM

## 2016-12-31 DIAGNOSIS — J9611 Chronic respiratory failure with hypoxia: Secondary | ICD-10-CM

## 2016-12-31 DIAGNOSIS — D869 Sarcoidosis, unspecified: Secondary | ICD-10-CM

## 2016-12-31 NOTE — Progress Notes (Signed)
Daily Session Note  Patient Details  Name: Travis Ferguson MRN: [id number] Date of Birth: [birth year] Referring Provider:     Pulmonary Rehab Walk Test from 10/17/2016 in [place]  Referring Provider  Dr. Halford Chessman      Encounter Date: 12/31/2016  Check In:     Session Check In - 12/31/16 1330      Check-In   Location [facility]   Staff Present Rosebud Poles, RN, BSN;Justo Hengel Ysidro Evert, RN;Portia Rollene Rotunda, RN, BSN   Supervising physician immediately available to respond to emergencies [facility] immediately available   Physician(s) Dr. Doyle Askew   Medication changes reported     No   Fall or balance concerns reported    No   Tobacco Cessation No Change   Warm-up and Cool-down Performed as group-led instruction   Resistance Training Performed Yes   VAD Patient? No     Pain Assessment   Currently in Pain? No/denies   Multiple Pain Sites No      Capillary Blood Glucose: No results found for this or any previous visit (from the past 24 hour(s)).      Exercise Prescription Changes - 12/31/16 1500      Response to Exercise   Blood Pressure (Admit) 110/64   Blood Pressure (Exercise) 114/80   Blood Pressure (Exit) 98/66   Heart Rate (Admit) 90 bpm   Heart Rate (Exercise) 98 bpm   Heart Rate (Exit) 83 bpm   Oxygen Saturation (Admit) 95 %   Oxygen Saturation (Exercise) 94 %   Oxygen Saturation (Exit) 95 %   Rating of Perceived Exertion (Exercise) 11   Perceived Dyspnea (Exercise) 0   Duration Progress to 45 minutes of aerobic exercise without signs/symptoms of physical distress   Intensity THRR unchanged     Progression   Progression Continue to progress workloads to maintain intensity without signs/symptoms of physical distress.     Resistance Training   Training Prescription Yes   Weight orange bands   Reps 10-15   Time 10 Minutes     Interval Training   Interval Training No     NuStep   Level 4   Minutes 34   METs 1.6      Track   Laps 9   Minutes 17      History  Smoking Status  . Former Smoker  . Packs/day: 0.00  . Years: 30.00  . Types: Pipe  . Quit date: 06/24/2008  Smokeless Tobacco  . Never Used    Goals Met:  Exercise tolerated well No report of cardiac concerns or symptoms Strength training completed today  Goals Unmet:  Not Applicable  Comments: Service time is from 1330 to 1455    Dr. Rush Farmer is Medical Director for Pulmonary Rehab at [facility].

## 2017-01-02 ENCOUNTER — Encounter (HOSPITAL_COMMUNITY)
Admission: RE | Admit: 2017-01-02 | Discharge: 2017-01-02 | Disposition: A | Discharge status: Home / Self Care | Payer: Medicare Other | Source: Ambulatory Visit | Attending: Pulmonary Disease | Admitting: Pulmonary Disease

## 2017-01-02 VITALS — Wt 263.0 lb

## 2017-01-02 DIAGNOSIS — J9611 Chronic respiratory failure with hypoxia: Secondary | ICD-10-CM

## 2017-01-02 DIAGNOSIS — J438 Other emphysema: Secondary | ICD-10-CM

## 2017-01-02 DIAGNOSIS — D869 Sarcoidosis, unspecified: Secondary | ICD-10-CM

## 2017-01-02 NOTE — Progress Notes (Signed)
Daily Session Note  Patient Details  Name: Travis Ferguson MRN: [id number] Date of Birth: 02-24-1941 Referring Provider:     Pulmonary Rehab Walk Test from 10/17/2016 in [place]  Referring Provider  Dr. Halford Chessman      Encounter Date: 01/02/2017  Check In:     Session Check In - 01/02/17 1342      Check-In   Location [facility]   Staff Present Trish Fountain, RN, Maxcine Ham, RN, Roque Cash, RN   Supervising physician immediately available to respond to emergencies [facility] immediately available   Physician(s) Dr. [place] Pink   Medication changes reported     No   Fall or balance concerns reported    No   Tobacco Cessation No Change   Warm-up and Cool-down Performed as group-led instruction   Resistance Training Performed Yes   VAD Patient? No     Pain Assessment   Currently in Pain? No/denies   Multiple Pain Sites No      Capillary Blood Glucose: No results found for this or any previous visit (from the past 24 hour(s)).      Exercise Prescription Changes - 01/02/17 1500      Response to Exercise   Blood Pressure (Admit) 100/60   Blood Pressure (Exercise) 110/60   Blood Pressure (Exit) 102/60   Heart Rate (Admit) 94 bpm   Heart Rate (Exercise) 105 bpm   Heart Rate (Exit) 102 bpm   Oxygen Saturation (Admit) 94 %   Oxygen Saturation (Exercise) 92 %   Oxygen Saturation (Exit) 94 %   Rating of Perceived Exertion (Exercise) 13   Perceived Dyspnea (Exercise) 0   Duration Progress to 45 minutes of aerobic exercise without signs/symptoms of physical distress   Intensity THRR unchanged     Progression   Progression Continue to progress workloads to maintain intensity without signs/symptoms of physical distress.     Resistance Training   Training Prescription Yes   Weight orange bands   Reps 10-15   Time 10 Minutes     Interval Training   Interval Training No     NuStep   Level 4   Minutes 17   METs 1.5     Track   Laps 10   Minutes 17      History  Smoking Status  . Former Smoker  . Packs/day: 0.00  . Years: 30.00  . Types: Pipe  . Quit date: 06/24/2008  Smokeless Tobacco  . Never Used    Goals Met:  Exercise tolerated well Strength training completed today  Goals Unmet:  Not Applicable  Comments: Service time is from 1330 to 1510    Dr. Rush Farmer is Medical Director for Pulmonary Rehab at [facility].

## 2017-01-06 ENCOUNTER — Encounter: Payer: Self-pay | Admitting: Neurology

## 2017-01-06 ENCOUNTER — Ambulatory Visit (INDEPENDENT_AMBULATORY_CARE_PROVIDER_SITE_OTHER): Payer: Medicare Other | Admitting: Neurology

## 2017-01-06 VITALS — BP 107/72 | HR 101 | Ht 71.0 in | Wt 260.0 lb

## 2017-01-06 DIAGNOSIS — R251 Tremor, unspecified: Secondary | ICD-10-CM | POA: Diagnosis not present

## 2017-01-06 NOTE — Patient Instructions (Addendum)
Your exam is stable. No signs for parkinsonism.  Please reduce your caffeine intake, as it can cause increase in urination and can increase tremors.

## 2017-01-06 NOTE — Progress Notes (Signed)
Subjective:    Patient ID: Travis Ferguson is a 76 y.o. male.  HPI      Interim history:   Travis Ferguson is a 76 year old right-handed gentleman with an underlying complicated medical history of COPD, on supplemental oxygen treatment at night, sarcoidosis, former smoking (quit in 2010), mild obstructive sleep apnea (never on CPAP), history of subdural hematoma post fall in 2010, requiring surgery, hypertension, reflux disease and hiatal hernia, diastolic dysfunction, degenerative neck disease and low back pain, arthritis, anxiety, depression, diastolic dysfunction, hyperlipidemia, status post lumbar laminectomy, history of traumatic rotator cuff tear on the left, status post left total shoulder replacement in April 2017, status post left ulnar nerve transposition in August 2017, who presents for follow-up consultation of his bilateral hand tremor. The patient is accompanied by his wife today. I first met him on 07/08/2016 at the request of Dr. Veverly Fells in orthopedics, at which time he reported a several month history of left upper extremity tremor, reported his tremor started after his left shoulder surgery. He was worried about parkinsonism. He did not have any telltale signs or symptoms of parkinsonism and I reassured them in that regard. He had a slight tremor in the right upper extremity as well. I suggested monitoring symptoms and exam. I ordered a brain MRI without contrast. He had a brain MRI without contrast on 07/20/2016 which I reviewed: IMPRESSION:  This MRI of the brain without contrast shows the following: 1.   Mild to  moderate cortical atrophy that is more pronounced in the mesial temporal lobes. 2.   Mild chronic microvascular ischemic change 3.   There is evidence of prior right frontal craniotomy and the subdural hematoma present on the 05/09/2009 MRI has resolved. 4.   There are no acute findings.   We called him with his test results.   Today, 01/06/2017 (all dictated new, as well as  above notes, some dictation done in note pad or Word, outside of chart, may appear as copied):   He reports doing okay, tremor intermittent, stable. Mostly bothered by pain in various areas of the body. On hydrocodone every 4 to 6 hours, managed by Dr. Nelva Bush. No recent injections. Saw another specialist on [place] and was offered a procedure, but patient opted out of it. He has been in pulmonary rehabilitation for the past couple of months, feels, he is doing better.  He does not always hydrate well with water. Lots of sodas, with caffeine, diet. He takes tylenol too, does not exceed 2 g per day, he states. He has tried to go off the pain meds, but had flare up in pain. He has never been on CPAP, but was given O2 at night instead.   The patient's allergies, current medications, family history, past medical history, past social history, past surgical history and problem list were reviewed and updated as appropriate.   Previously (copied from previous notes for reference):   07/08/2016: (He) reports a tremor in his LUE for the past several months, L more than R and he feels like the tremor started after his left shoulder surgery. he is worried about developing Parkinson's disease. He does admit to a tremendous amount of stress and history of anxiety and depression, also inpatient treatment for his depression before. He has been on Prozac generic 40 mg daily and recently we started a prednisone taper for his COPD. Last time he was on prednisone was a few weeks ago and he restarted prednisone at 40 mg yesterday with taper over  the course of about 8 days. He lives with his wife. He is a retired Airline pilot. He has 2 grown children.  He quit smoking the pipe in 2010, does not drink alcohol. I reviewed your office note from 01/23/2016 as well as from 05/31/2016. He had a brain MRI wo contrast on 05/09/2009, which I reviewed: IMPRESSION: Moderate to large subacute subdural hematoma in the right  frontal parietal lobe with mass effect and mild midline shift.  His Past Medical History Is Significant For: Past Medical History:  Diagnosis Date  . Acute on chronic combined systolic and diastolic CHF (congestive heart failure) ([place]) 02/24/2015  . Anxiety   . Arthritis    "about q joint I've got" (09/22/2015)  . Atherosclerosis   . Calculus of gallbladder with acute cholecystitis and obstruction   . CAP (community acquired pneumonia) 02/24/2015  . Cervical disc disease   . Chronic lower back pain    chronic back pain/under pain management  . Colon polyps    adenomatous and hyperplastic  . Colon polyps 08/02/2014   Tubular adenoma x 3, Hyperplastic-1  . COPD (chronic obstructive pulmonary disease) ([place])    a. Former Airline pilot, also smoked a pipe.  . Coronary artery calcification seen on CAT scan   . Depression   . Diastolic dysfunction    grade 1 diastolic dysfunction on 2-D echo  . Dyslipidemia   . Dyspnea    chronic  . Encounter for biliary drainage tube placement    remains with drainage bag to right side for  biliary drainage.  . Frequent urination   . Gallstones   . Gangrenous cholecystitis 08/17/2014  . GERD (gastroesophageal reflux disease)   . Hiatal hernia    sensation problem ("right lateral side hip to knee").  . History of blood transfusion "numerous"  . History of oxygen administration    @ 2 l/m nasally at bedtime.(09/22/2015)  . Hypertension   . Inguinal hernia    right side  . Left ventricular dysfunction    ejection fraction of 25-30% with regional wall motion abnormalities.  . Migraine    "years since I've had one" (09/22/2015)  . OSA (obstructive sleep apnea)    does not use CPAP; "just oxygen" (09/22/2015)  . Pneumonia ~ 1958  . PTSD (post-traumatic stress disorder)   . Rhinitis   . Sarcoidosis   . Sensation problem    right side-lateral hip to knee" decreased sensation and tingling feeling" -has informed Dr. Joylene Draft, Dr. Henrene Pastor, Dr. Lucia Gaskins of this.   . Sepsis ([place])   . Sinus congestion    10-27-14 at present some issues-"not bad"  . Steroid-induced hyperglycemia 02/25/2015  . Subdural hematoma ([place]) 11/10   a. 2010 s/p surgery - diagnosed several weeks after a fall.    His Past Surgical History Is Significant For: Past Surgical History:  Procedure Laterality Date  . BACK SURGERY    . BRAIN SURGERY  04/2009   right frontal"hemorrhage evacuation from fall injury"  . CARDIAC CATHETERIZATION N/A 01/23/2015   Procedure: Right/Left Heart Cath and Coronary Angiography;  Surgeon: Lorretta Harp, MD;  Location: [facility];  Service: Cardiovascular;  Laterality: N/A;  . CHOLECYSTECTOMY N/A 11/08/2014   Procedure: LAPAROSCOPIC CHOLECYSTECTOMY ;  Surgeon: Alphonsa Overall, MD;  Location: [facility];  Service: General;  Laterality: N/A;  . ERCP N/A 11/07/2014   Procedure: ENDOSCOPIC RETROGRADE CHOLANGIOPANCREATOGRAPHY (ERCP);  Surgeon: Irene Shipper, MD;  Location: [facility];  Service: Endoscopy;  Laterality: N/A;  . GALLBLADDER SURGERY  09/2014   drain tube placed  . INGUINAL HERNIA REPAIR Left   . KNEE ARTHROSCOPY Right   . LUMBAR LAMINECTOMY/DECOMPRESSION MICRODISCECTOMY Right 12/15/2012   Procedure: LUMBAR LAMINECTOMY/DECOMPRESSION MICRODISCECTOMY 1 LEVEL;  Surgeon: Elaina Hoops, MD;  Location: [facility];  Service: Neurosurgery;  Laterality: Right;  Right Lumbar four-five laminectomy/foraminotomy  . REVERSE SHOULDER ARTHROPLASTY Left 09/22/2015   Procedure: LEFT REVERSE TOTAL SHOULDER ARTHROPLASTY;  Surgeon: Netta Cedars, MD;  Location: [facility];  Service: Orthopedics;  Laterality: Left;  . REVERSE TOTAL SHOULDER ARTHROPLASTY Left 09/22/2015  . VIDEO BRONCHOSCOPY  07/28/2012   Procedure: VIDEO BRONCHOSCOPY WITH FLUORO;  Surgeon: Chesley Mires, MD;  Location: [facility];  Service: Cardiopulmonary;  Laterality: Bilateral;    His Family History Is Significant For: Family History  Problem Relation Age of Onset  . Heart disease Mother   . Heart  disease Father   . Colon polyps Brother   . Congestive Heart Failure Brother   . Coronary artery disease Unknown   . Stomach cancer Paternal Grandmother   . Colon cancer Neg Hx   . Pancreatic cancer Neg Hx   . Rectal cancer Neg Hx   . Esophageal cancer Neg Hx     His Social History Is Significant For: Social History   Social History  . Marital status: Married    Spouse name: N/A  . Number of children: 2  . Years of education: 14   Occupational History  . retired Airline pilot in [place]  . Smoking status: Former Smoker    Packs/day: 0.00    Years: 30.00    Types: Pipe    Quit date: 06/24/2008  . Smokeless tobacco: Never Used  . Alcohol use No  . Drug use: No  . Sexual activity: Not Currently   Other Topics Concern  . None   Social History Narrative   Drinks about 6-7 caffeine drinks a day     His Allergies Are:  Allergies  Allergen Reactions  . Statins Other (See Comments)    Muscle aches - tolerating Vytorin  :   His Current Medications Are:  Outpatient Encounter Prescriptions as of 01/06/2017  Medication Sig  . DULoxetine (CYMBALTA) 30 MG capsule Take 30 mg by mouth daily.  [name] ENTRESTO 24-26 MG TAKE 1 TABLET BY MOUTH 2 (TWO) TIMES DAILY.  . finasteride (PROSCAR) 5 MG tablet Take 5 mg by mouth daily.   [name] FLUoxetine (PROZAC) 40 MG capsule Take 40 mg by mouth daily with breakfast.   . furosemide (LASIX) 20 MG tablet Take 1 tablet (20 mg total) by mouth daily.  [name] HYDROcodone-acetaminophen (NORCO/VICODIN) 5-325 MG tablet Take 1 tablet by mouth every 6 (six) hours as needed for moderate pain.  [name] ipratropium (ATROVENT) 0.02 % nebulizer solution Take 2.5 mLs (0.5 mg total) by nebulization 4 (four) times daily.  . metoprolol tartrate (LOPRESSOR) 25 MG tablet TAKE 1 TABLET (25 MG TOTAL) BY MOUTH 2 (TWO) TIMES DAILY.  . Multiple Vitamin (MULTIVITAMIN) tablet Take 1 tablet by mouth daily.  . OXYGEN Inhale 2 L/min into the lungs at bedtime.  [name]  PROAIR HFA 108 (90 BASE) MCG/ACT inhaler 1-2 PUFFS EVERY 4-6 HOURS AS NEEDED (Patient taking differently: 1-2 PUFFS EVERY 4-6 HOURS AS NEEDED for shortness of breath)  . simvastatin (ZOCOR) 20 MG tablet Take 20 mg by mouth every evening.   . sodium chloride (OCEAN) 0.65 % SOLN nasal spray Place 1-2 sprays into both nostrils daily as needed for  congestion.  [name] spironolactone (ALDACTONE) 25 MG tablet Take 1 tablet (25 mg total) by mouth daily.  . SYMBICORT 160-4.5 MCG/ACT inhaler INHALE 2 PUFFS INTO THE LUNGS 2 (TWO) TIMES DAILY.  . tamsulosin (FLOMAX) 0.4 MG CAPS capsule Take 0.4 mg by mouth daily.  . [DISCONTINUED] budesonide-formoterol (SYMBICORT) 160-4.5 MCG/ACT inhaler Inhale 2 puffs into the lungs 2 (two) times daily.  . [DISCONTINUED] budesonide-formoterol (SYMBICORT) 160-4.5 MCG/ACT inhaler Inhale 2 puffs into the lungs 2 (two) times daily.   No facility-administered encounter medications on file as of 01/06/2017.   : Review of Systems:  Out of a complete 14 point review of systems, all are reviewed and negative with the exception of these symptoms as listed below:  Review of Systems  Neurological:       Pt presents today to discuss his tremor. Pt is noticing the tremor in his left hand, with some numbness in his left pinky finger. Pt is also complaining of a burning sensation on the soles of his feet. Pt is also complaining of headaches every other day that feels like burning on his temples.    Objective:  Neurological Exam  Physical Exam Physical Examination:   Vitals:   01/06/17 1448  BP: 107/72  Pulse: (!) 101   General Examination: The patient is a very pleasant 76 y.o. male in no acute distress. He appears well-developed and well-nourished and well groomed.   HEENT: Normocephalic, atraumatic, pupils are equal, round and reactive to light and accommodation. Extraocular tracking is good without limitation to gaze excursion or nystagmus noted. Hearing is grossly intact. Face is  symmetric with normal facial animation and normal facial sensation. Speech is clear with no dysarthria noted. Slightly pressured speech. There is no lip, neck/head, jaw or voice tremor. Oropharynx exam reveals: moderate mouth dryness. Tongue protrudes centrally and palate elevates symmetrically.   Chest: Clear to auscultation with mild wheezing, no rhonchi or crackles noted.  Heart: S1+S2+0, regular and normal without murmurs, rubs or gallops noted.   Abdomen: Soft, non-tender and non-distended with normal bowel sounds appreciated on auscultation.  Extremities: There is trace pitting edema in the R ankle.   Skin: Warm and dry without trophic changes noted. Mild bruising of skin noted.   Musculoskeletal: Decreased range of motion left shoulder and left elbow, mild thenar and hyperthenar muscle wasting both hands.   Neurologically:  Mental status: The patient is awake, alert and oriented in all 4 spheres. His immediate and remote memory, attention, language skills and fund of knowledge are appropriate. There is no evidence of aphasia, agnosia, apraxia or anomia. Speech is slightly pressured, needs a little redirection at times. Mood is normal and affect is decreased range.   Cranial nerves II - XII are as described above under HEENT exam.  Motor exam: Normal bulk, normal tone is noted, strength about the same. No resting tremor, mild postural and action tremor in both upper extremities, left side slightly more pronounced than right. Stable findings. Fine motor skills are overall mildly impaired globally. Reflexes are 1+ in the upper extremities, trace in the knees bilaterally and absent in the ankles bilaterally.  Cerebellar testing: No dysmetria or intention tremor, finger to nose not possible on the left.  Sensory exam: intact to light touch in the upper and lower extremities.  Gait, station and balance: He stands with mild difficulty and pushes up with both hands. He walks with preserved  arm swing on the right and mildly decreased arm swing on the left, but in  keeping with decreased range of motion of the left shoulder, he turns slowly, posture is age-appropriate. Tandem walk is not possible for him. Did not bring his cane.    Assessment and Plan:   In summary, Travis Ferguson is a very pleasant 77 year old male with an underlying complicated medical history of COPD, on oxygen treatment at night, sarcoidosis, former smoking, obstructive sleep apnea (not on CPAP), history of subdural hematoma post fall in 2010, requiring surgery, hypertension, reflux disease and hiatal hernia, diastolic dysfunction, degenerative neck disease and low back pain, arthritis, anxiety, depression, diastolic dysfunction, hyperlipidemia, status post lumbar laminectomy, history of traumatic rotator cuff tear on the left, status post left total shoulder replacement in April 2017, status post left ulnar nerve transposition in August 2017, chronic pain, on narcotic pain medication, who presents for follow-up consultation of his hand tremor. Exam is stable, no telltale signs of parkinsonism. Brain MRI showed nonspecific findings of mild to moderate atrophy, status post surgical changes from his subdural hematoma surgery. I explained his brain MRI findings to the patient and his wife. Tremor could be multifactorial in nature. At this juncture, I suggested he reduce his caffeine intake as he can drink multiple cans of soda per day, does not drink water very much at all. He is furthermore advised to talk to his primary care physician and his pain management physician about the amount of Tylenol that may be safe for him to take as he takes hydrocodone with Tylenol as well as Tylenol separately but reports that he does not exceed 2 g per day. Nevertheless, on a day-to-day basis this may be quite a bit of Tylenol in his system. He is advised to use his cane for gait safety at all times. I suggested as needed follow-up with me. I  answered all their questions today and the patient and his wife were in agreement. I spent 20 minutes in total face-to-face time with the patient, more than 50% of which was spent in counseling and coordination of care, reviewing test results, reviewing medication and discussing or reviewing the diagnosis of tremor, its prognosis and treatment options. Pertinent laboratory and imaging test results that were available during this visit with the patient were reviewed by me and considered in my medical decision making (see chart for details).

## 2017-01-07 ENCOUNTER — Encounter (HOSPITAL_COMMUNITY): Admission: RE | Admit: 2017-01-07 | Payer: Medicare Other | Source: Ambulatory Visit

## 2017-01-09 ENCOUNTER — Encounter (HOSPITAL_COMMUNITY)
Admission: RE | Admit: 2017-01-09 | Discharge: 2017-01-09 | Disposition: A | Discharge status: Home / Self Care | Payer: Medicare Other | Source: Ambulatory Visit | Attending: Pulmonary Disease | Admitting: Pulmonary Disease

## 2017-01-09 VITALS — Wt 264.8 lb

## 2017-01-09 DIAGNOSIS — J9611 Chronic respiratory failure with hypoxia: Secondary | ICD-10-CM

## 2017-01-09 DIAGNOSIS — J438 Other emphysema: Secondary | ICD-10-CM

## 2017-01-09 DIAGNOSIS — D869 Sarcoidosis, unspecified: Secondary | ICD-10-CM

## 2017-01-09 NOTE — Progress Notes (Signed)
Daily Session Note  Patient Details  Name: Travis Ferguson MRN: [id number] Date of Birth: 16-Oct-1940 Referring Provider:     Pulmonary Rehab Walk Test from 10/17/2016 in [place]  Referring Provider  Dr. Halford Chessman      Encounter Date: 01/09/2017  Check In:     Session Check In - 01/09/17 1354      Check-In   Location [facility]   Staff Present Su Hilt, MS, ACSM RCEP, Exercise Physiologist;Joan [place] Reeves, RN, BSN;Lisa Hughes, RN;Eain Mullendore Rollene Rotunda, RN, BSN   Supervising physician immediately available to respond to emergencies [facility] immediately available   Physician(s) Dr. Burnis Medin   Medication changes reported     No   Fall or balance concerns reported    No   Tobacco Cessation No Change   Warm-up and Cool-down Performed as group-led instruction   Resistance Training Performed Yes   VAD Patient? No     Pain Assessment   Currently in Pain? No/denies   Multiple Pain Sites No      Capillary Blood Glucose: No results found for this or any previous visit (from the past 24 hour(s)).      Exercise Prescription Changes - 01/09/17 1610      Response to Exercise   Blood Pressure (Admit) 100/64   Blood Pressure (Exercise) 106/70   Blood Pressure (Exit) 94/62   Heart Rate (Admit) 101 bpm   Heart Rate (Exercise) 98 bpm   Heart Rate (Exit) 90 bpm   Oxygen Saturation (Admit) 92 %   Oxygen Saturation (Exercise) 94 %   Oxygen Saturation (Exit) 95 %   Rating of Perceived Exertion (Exercise) 13   Perceived Dyspnea (Exercise) 1   Duration Progress to 45 minutes of aerobic exercise without signs/symptoms of physical distress   Intensity THRR unchanged     Progression   Progression Continue to progress workloads to maintain intensity without signs/symptoms of physical distress.     Resistance Training   Training Prescription Yes   Weight orange bands   Reps 10-15   Time 10 Minutes     Interval Training   Interval  Training No     NuStep   Level 4   Minutes 34   METs 1.5      History  Smoking Status  . Former Smoker  . Packs/day: 0.00  . Years: 30.00  . Types: Pipe  . Quit date: 06/24/2008  Smokeless Tobacco  . Never Used    Goals Met:  Independence with exercise equipment Improved SOB with ADL's Using PLB without cueing & demonstrates good technique Exercise tolerated well No report of cardiac concerns or symptoms Strength training completed today  Goals Unmet:  Not Applicable  Comments: Service time is from 1330 to 1530   Dr. Rush Farmer is Medical Director for Pulmonary Rehab at [facility].

## 2017-01-14 ENCOUNTER — Encounter (HOSPITAL_COMMUNITY): Payer: Medicare Other

## 2017-01-16 ENCOUNTER — Encounter (HOSPITAL_COMMUNITY)
Admission: RE | Admit: 2017-01-16 | Discharge: 2017-01-16 | Disposition: A | Discharge status: Home / Self Care | Payer: Medicare Other | Source: Ambulatory Visit | Attending: Pulmonary Disease | Admitting: Pulmonary Disease

## 2017-01-16 DIAGNOSIS — J9611 Chronic respiratory failure with hypoxia: Secondary | ICD-10-CM

## 2017-01-16 DIAGNOSIS — J438 Other emphysema: Secondary | ICD-10-CM | POA: Diagnosis not present

## 2017-01-16 NOTE — Progress Notes (Signed)
Daily Session Note  Patient Details  Name: Travis Ferguson MRN: [id number] Date of Birth: 1940/09/20 Referring Provider:     Pulmonary Rehab Walk Test from 10/17/2016 in [place]  Referring Provider  Dr. Halford Chessman      Encounter Date: 01/16/2017  Check In:     Session Check In - 01/16/17 1330      Check-In   Location [facility]   Staff Present Rosebud Poles, RN, BSN;Molly diVincenzo, MS, ACSM RCEP, Exercise Physiologist;Lisa Ysidro Evert, RN   Supervising physician immediately available to respond to emergencies [facility] immediately available   Physician(s) Dr. [place] Pink   Medication changes reported     No   Fall or balance concerns reported    No   Tobacco Cessation No Change   Warm-up and Cool-down Performed as group-led instruction   Resistance Training Performed Yes   VAD Patient? No     Pain Assessment   Currently in Pain? No/denies   Multiple Pain Sites No      Capillary Blood Glucose: No results found for this or any previous visit (from the past 24 hour(s)).      Exercise Prescription Changes - 01/16/17 1500      Response to Exercise   Blood Pressure (Admit) 110/60   Blood Pressure (Exercise) 130/80   Blood Pressure (Exit) 110/62   Heart Rate (Admit) 66 bpm   Heart Rate (Exercise) 104 bpm   Heart Rate (Exit) 74 bpm   Oxygen Saturation (Admit) 94 %   Oxygen Saturation (Exercise) 93 %   Oxygen Saturation (Exit) 95 %   Rating of Perceived Exertion (Exercise) 9   Perceived Dyspnea (Exercise) 0   Duration Progress to 45 minutes of aerobic exercise without signs/symptoms of physical distress   Intensity THRR unchanged     Progression   Progression Continue to progress workloads to maintain intensity without signs/symptoms of physical distress.     Resistance Training   Training Prescription Yes   Weight orange bands   Reps 10-15   Time 10 Minutes     Interval Training   Interval Training No     NuStep    Level 4   Minutes 17   METs 1.4     Track   Laps 9   Minutes 17      History  Smoking Status  . Former Smoker  . Packs/day: 0.00  . Years: 30.00  . Types: Pipe  . Quit date: 06/24/2008  Smokeless Tobacco  . Never Used    Goals Met:  Exercise tolerated well Strength training completed today  Goals Unmet:  Not Applicable  Comments: Service time is from 1330 to 1500    Dr. Rush Farmer is Medical Director for Pulmonary Rehab at [facility].

## 2017-01-21 ENCOUNTER — Telehealth (HOSPITAL_COMMUNITY): Payer: Self-pay | Admitting: *Deleted

## 2017-01-21 ENCOUNTER — Encounter (HOSPITAL_COMMUNITY): Payer: Self-pay

## 2017-01-21 ENCOUNTER — Encounter (HOSPITAL_COMMUNITY)
Admission: RE | Admit: 2017-01-21 | Discharge: 2017-01-21 | Disposition: A | Discharge status: Home / Self Care | Payer: Medicare Other | Source: Ambulatory Visit | Attending: Pulmonary Disease | Admitting: Pulmonary Disease

## 2017-01-21 DIAGNOSIS — J438 Other emphysema: Secondary | ICD-10-CM

## 2017-01-21 DIAGNOSIS — J9611 Chronic respiratory failure with hypoxia: Secondary | ICD-10-CM

## 2017-01-21 NOTE — Progress Notes (Signed)
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 in [place]  Date  12/19/16  Educator  EP  Instruction Review Code  2- meets goals/outcomes      Pulmonary Medications:   -Verbally interactive group education provided by instructor with focus on inhaled medications and proper administration.   PULMONARY REHAB OTHER RESPIRATORY from 01/16/2017 in [place]  Date  11/07/16  Educator  Pharm  Instruction Review Code  2- meets goals/outcomes      Anatomy and Physiology of the Respiratory System and Intimacy:  -Group instruction provided by PowerPoint, verbal discussion, and written material to support subject matter. Instructor reviews respiratory cycle and anatomical components of the respiratory system and their  functions. Instructor also reviews differences in obstructive and restrictive respiratory diseases with examples of each. Intimacy, Sex, and Sexuality differences are reviewed with a discussion on how relationships can change when diagnosed with pulmonary disease. Common sexual concerns are reviewed.   PULMONARY REHAB OTHER RESPIRATORY from 01/16/2017 in [place]  Date  01/09/17  Educator  rn  Instruction Review Code  2- meets goals/outcomes      MD DAY -A group question and answer session with a medical doctor that allows participants to ask questions that relate to their pulmonary disease state.   PULMONARY REHAB OTHER RESPIRATORY from 01/16/2017 in [place]  Date  12/26/16  Educator  yacoub  Instruction Review Code  2- meets goals/outcomes      OTHER EDUCATION -Group or individual verbal, written, or video instructions that support the educational goals of the pulmonary rehab program.   Knowledge Questionnaire Score:     Knowledge Questionnaire Score - 11/27/16 1410      Knowledge Questionnaire Score   Pre Score 11/13      Core Components/Risk Factors/Patient Goals at Admission:   Core Components/Risk Factors/Patient Goals Review:      Goals and Risk Factor Review    Row Name 11/26/16 0947 12/24/16 0949 01/21/17 1952          Core Components/Risk Factors/Patient Goals Review   Personal Goals Review Develop more efficient breathing techniques such as purse lipped breathing and diaphragmatic breathing and practicing self-pacing with activity.;Improve shortness of breath with ADL's;Increase knowledge of respiratory medications and ability to use respiratory devices properly. Develop more efficient breathing techniques such as purse lipped breathing and diaphragmatic breathing and practicing self-pacing with activity.;Improve shortness of breath with ADL's;Increase knowledge of respiratory medications and ability to use respiratory devices properly. Develop more efficient breathing techniques such as purse lipped breathing and diaphragmatic breathing and practicing self-pacing with activity.;Improve shortness of breath with ADL's;Increase knowledge of respiratory medications and ability to use respiratory devices properly.     Review progressing with workload increases, does well in a group setting to keep him occupied 2 days per week experiences chronic pain in shoulder, back, and legs.  Due to pain his progression is slow, but he is benefiting from the social aspect of the program patient states that he is seeing an improvement in his shortness of breath with home ADLs however the improvement is overshadowed by the constant pain in his left shoulder. This is his second time in the program and he has significantly done better physically and socially/emotionally. He may be at a maintenance phase at this point. Fearful that he will not continue his home exercise post discharge related to his constant pain. will need lost of social support and encouragement prior to graduation.     Expected Outcomes continue to increase workloads as tolerated, continue with group support  continue to increase workloads as tolerated, continue with group support  continue to increase workloads as tolerated, continue with group support         Core  Components/Risk Factors/Patient Goals at Discharge (Final Review):      Goals and Risk Factor Review - 01/21/17 1952      Core Components/Risk Factors/Patient Goals Review   Personal Goals Review Develop more efficient breathing techniques such as purse lipped breathing and diaphragmatic breathing and practicing self-pacing with activity.;Improve shortness of breath with ADL's;Increase knowledge of respiratory medications and ability to use respiratory devices properly.   Review patient states  that he is seeing an improvement in his shortness of breath with home ADLs however the improvement is overshadowed by the constant pain in his left shoulder. This is his second time in the program and he has significantly done better physically and socially/emotionally. He may be at a maintenance phase at this point. Fearful that he will not continue his home exercise post discharge related to his constant pain. will need lost of social support and encouragement prior to graduation.   Expected Outcomes continue to increase workloads as tolerated, continue with group support       ITP Comments:   Comments: ITP REVIEW Pt is making slow but expected progress toward pulmonary rehab goals after completing 19 sessions. He will need lost of encouragement and social support at discharge in order to continue his home exercise.Recommend continued exercise, life style modification, education, and utilization of breathing techniques to increase stamina and strength and decrease shortness of breath with exertion.

## 2017-01-23 ENCOUNTER — Encounter (HOSPITAL_COMMUNITY): Admission: RE | Admit: 2017-01-23 | Payer: Medicare Other | Source: Ambulatory Visit

## 2017-01-28 ENCOUNTER — Encounter (HOSPITAL_COMMUNITY)
Admission: RE | Admit: 2017-01-28 | Discharge: 2017-01-28 | Disposition: A | Discharge status: Home / Self Care | Payer: Medicare Other | Source: Ambulatory Visit | Attending: Pulmonary Disease | Admitting: Pulmonary Disease

## 2017-01-28 VITALS — Wt 263.9 lb

## 2017-01-28 DIAGNOSIS — J9611 Chronic respiratory failure with hypoxia: Secondary | ICD-10-CM

## 2017-01-28 DIAGNOSIS — J438 Other emphysema: Secondary | ICD-10-CM | POA: Insufficient documentation

## 2017-01-28 NOTE — Progress Notes (Signed)
Daily Session Note  Patient Details  Name: Travis Ferguson MRN: [id number] Date of Birth: 04-Feb-1941 Referring Provider:     Pulmonary Rehab Walk Test from 10/17/2016 in [place]  Referring Provider  Dr. Halford Chessman      Encounter Date: 01/28/2017  Check In:     Session Check In - 01/28/17 1330      Check-In   Location [facility]   Staff Present Su Hilt, MS, ACSM RCEP, Exercise Physiologist;Lisa Ysidro Evert, RN;Portia Rollene Rotunda, Therapist, sports, BSN;Ramon Dredge, RN, [facility]   Supervising physician immediately available to respond to emergencies [facility] immediately available   Physician(s) Dr. Allyson Sabal   Medication changes reported     No   Fall or balance concerns reported    No   Tobacco Cessation No Change   Warm-up and Cool-down Performed as group-led instruction   Resistance Training Performed Yes   VAD Patient? No     Pain Assessment   Currently in Pain? No/denies   Multiple Pain Sites No      Capillary Blood Glucose: No results found for this or any previous visit (from the past 24 hour(s)).      Exercise Prescription Changes - 01/28/17 1500      Response to Exercise   Blood Pressure (Admit) 92/64   Blood Pressure (Exercise) 116/72   Blood Pressure (Exit) 98/60   Heart Rate (Admit) 99 bpm   Heart Rate (Exercise) 100 bpm   Heart Rate (Exit) 99 bpm   Oxygen Saturation (Admit) 96 %   Oxygen Saturation (Exercise) 95 %   Oxygen Saturation (Exit) 95 %   Rating of Perceived Exertion (Exercise) 11   Perceived Dyspnea (Exercise) 1   Duration Progress to 45 minutes of aerobic exercise without signs/symptoms of physical distress   Intensity THRR unchanged     Progression   Progression Continue to progress workloads to maintain intensity without signs/symptoms of physical distress.     Resistance Training   Training Prescription Yes   Weight orange bands   Reps 10-15   Time 10 Minutes     Interval Training   Interval  Training No     NuStep   Level 4   Minutes 34   METs 1.9     Track   Laps 9   Minutes 17      History  Smoking Status  . Former Smoker  . Packs/day: 0.00  . Years: 30.00  . Types: Pipe  . Quit date: 06/24/2008  Smokeless Tobacco  . Never Used    Goals Met:  Exercise tolerated well No report of cardiac concerns or symptoms Strength training completed today  Goals Unmet:  Not Applicable  Comments: Service time is from 1:30P to 3:10P    Dr. Rush Farmer is Medical Director for Pulmonary Rehab at [facility].

## 2017-01-30 ENCOUNTER — Encounter (HOSPITAL_COMMUNITY)
Admission: RE | Admit: 2017-01-30 | Discharge: 2017-01-30 | Disposition: A | Discharge status: Home / Self Care | Payer: Medicare Other | Source: Ambulatory Visit | Attending: Pulmonary Disease | Admitting: Pulmonary Disease

## 2017-01-30 VITALS — Wt 262.8 lb

## 2017-01-30 DIAGNOSIS — J438 Other emphysema: Secondary | ICD-10-CM | POA: Diagnosis not present

## 2017-01-30 DIAGNOSIS — D869 Sarcoidosis, unspecified: Secondary | ICD-10-CM

## 2017-01-30 DIAGNOSIS — J9611 Chronic respiratory failure with hypoxia: Secondary | ICD-10-CM

## 2017-01-30 NOTE — Progress Notes (Signed)
Daily Session Note  Patient Details  Name: Travis Ferguson MRN: [id number] Date of Birth: 01/02/1941 Referring Provider:     Pulmonary Rehab Walk Test from 10/17/2016 in [place]  Referring Provider  Dr. Halford Chessman      Encounter Date: 01/30/2017  Check In:     Session Check In - 01/30/17 1344      Check-In   [facility]   Staff Present Su Hilt, MS, ACSM RCEP, Exercise Physiologist;Lisa Ysidro Evert, RN;Olyver Hawes Rollene Rotunda, RN, BSN   Supervising physician immediately available to respond to emergencies [facility] immediately available   Physician(s) Dr. Clementeen Graham   Medication changes reported     No   Fall or balance concerns reported    No   Tobacco Cessation No Change   Warm-up and Cool-down Performed as group-led instruction   Resistance Training Performed Yes   VAD Patient? No     Pain Assessment   Currently in Pain? No/denies   Multiple Pain Sites No      Capillary Blood Glucose: No results found for this or any previous visit (from the past 24 hour(s)).      Exercise Prescription Changes - 01/30/17 1559      Response to Exercise   Blood Pressure (Admit) 98/62   Blood Pressure (Exercise) 100/60   Blood Pressure (Exit) 128/70   Heart Rate (Admit) 99 bpm   Heart Rate (Exercise) 116 bpm   Heart Rate (Exit) 107 bpm   Oxygen Saturation (Admit) 95 %   Oxygen Saturation (Exercise) 95 %   Oxygen Saturation (Exit) 95 %   Rating of Perceived Exertion (Exercise) 11   Perceived Dyspnea (Exercise) 0   Duration Progress to 45 minutes of aerobic exercise without signs/symptoms of physical distress   Intensity THRR unchanged     Progression   Progression Continue to progress workloads to maintain intensity without signs/symptoms of physical distress.     Resistance Training   Training Prescription Yes   Weight orange bands   Reps 10-15   Time 10 Minutes     Interval Training   Interval Training No     NuStep    Level 5   Minutes 17   METs 1.7     Track   Laps 9   Minutes 17      History  Smoking Status  . Former Smoker  . Packs/day: 0.00  . Years: 30.00  . Types: Pipe  . Quit date: 06/24/2008  Smokeless Tobacco  . Never Used    Goals Met:  Independence with exercise equipment Improved SOB with ADL's Using PLB without cueing & demonstrates good technique Exercise tolerated well No report of cardiac concerns or symptoms Strength training completed today  Goals Unmet:  Not Applicable  Comments: Service time is from 1330 to 1500   Dr. Rush Farmer is Medical Director for Pulmonary Rehab at [facility].

## 2017-02-04 ENCOUNTER — Encounter (HOSPITAL_COMMUNITY)
Admission: RE | Admit: 2017-02-04 | Discharge: 2017-02-04 | Disposition: A | Discharge status: Home / Self Care | Payer: Medicare Other | Source: Ambulatory Visit | Attending: Pulmonary Disease | Admitting: Pulmonary Disease

## 2017-02-04 VITALS — Wt 262.8 lb

## 2017-02-04 DIAGNOSIS — J438 Other emphysema: Secondary | ICD-10-CM | POA: Diagnosis not present

## 2017-02-04 DIAGNOSIS — J9611 Chronic respiratory failure with hypoxia: Secondary | ICD-10-CM

## 2017-02-04 DIAGNOSIS — D869 Sarcoidosis, unspecified: Secondary | ICD-10-CM

## 2017-02-04 NOTE — Progress Notes (Signed)
Daily Session Note  Patient Details  Name: Travis Ferguson MRN: [id number] Date of Birth: [birth year] Referring Provider:     Pulmonary Rehab Walk Test from 10/17/2016 in [place]  Referring Provider  Dr. Halford Chessman      Encounter Date: 02/04/2017  Check In:     Session Check In - 02/04/17 1543      Check-In   [facility]   Staff Present Su Hilt, MS, ACSM RCEP, Exercise Physiologist;Woodrow Drab Ysidro Evert, RN;Olinty Celesta Aver, MS, ACSM CEP, Exercise Physiologist;Annedrea Rosezella [place], RN, [place] physician immediately available to respond to emergencies [facility] immediately available   Physician(s) Dr. Clementeen Graham   Medication changes reported     No   Fall or balance concerns reported    No   Tobacco Cessation No Change   Warm-up and Cool-down Performed as group-led instruction   Resistance Training Performed Yes   VAD Patient? No     Pain Assessment   Currently in Pain? No/denies      Capillary Blood Glucose: No results found for this or any previous visit (from the past 24 hour(s)).      Exercise Prescription Changes - 02/04/17 1500      Response to Exercise   Blood Pressure (Admit) 100/60   Blood Pressure (Exercise) 98/62   Blood Pressure (Exit) 102/60   Heart Rate (Admit) 103 bpm   Heart Rate (Exercise) 105 bpm   Heart Rate (Exit) 95 bpm   Oxygen Saturation (Admit) 95 %   Oxygen Saturation (Exercise) 92 %   Oxygen Saturation (Exit) 95 %   Rating of Perceived Exertion (Exercise) 11   Perceived Dyspnea (Exercise) 0   Duration Progress to 45 minutes of aerobic exercise without signs/symptoms of physical distress   Intensity THRR unchanged     Progression   Progression Continue to progress workloads to maintain intensity without signs/symptoms of physical distress.     Resistance Training   Training Prescription Yes   Weight orange bands   Reps 10-15   Time 10 Minutes     Interval Training   Interval Training No     NuStep   Level 4   Minutes 34     Track   Laps 9   Minutes 17      History  Smoking Status  . Former Smoker  . Packs/day: 0.00  . Years: 30.00  . Types: Pipe  . Quit date: 06/24/2008  Smokeless Tobacco  . Never Used    Goals Met:  Exercise tolerated well No report of cardiac concerns or symptoms Strength training completed today  Goals Unmet:  Not Applicable  Comments: Service time is from 1330 to 1500    Dr. Rush Farmer is Medical Director for Pulmonary Rehab at [facility].

## 2017-02-06 ENCOUNTER — Encounter (HOSPITAL_COMMUNITY)
Admission: RE | Admit: 2017-02-06 | Discharge: 2017-02-06 | Disposition: A | Discharge status: Home / Self Care | Payer: Medicare Other | Source: Ambulatory Visit | Attending: Pulmonary Disease | Admitting: Pulmonary Disease

## 2017-02-06 VITALS — Wt 263.9 lb

## 2017-02-06 DIAGNOSIS — J9611 Chronic respiratory failure with hypoxia: Secondary | ICD-10-CM

## 2017-02-06 DIAGNOSIS — D869 Sarcoidosis, unspecified: Secondary | ICD-10-CM

## 2017-02-06 DIAGNOSIS — J438 Other emphysema: Secondary | ICD-10-CM | POA: Diagnosis not present

## 2017-02-06 NOTE — Progress Notes (Signed)
Daily Session Note  Patient Details  Name: Travis Ferguson MRN: [id number] Date of Birth: 1940/11/14 Referring Provider:     Pulmonary Rehab Walk Test from 10/17/2016 in [place]  Referring Provider  Dr. Halford Chessman      Encounter Date: 02/06/2017  Check In:     Session Check In - 02/06/17 1527      Check-In   [facility]   Staff Present Trish Fountain, RN, BSN;Martha Soltys Ysidro Evert, RN;Molly diVincenzo, MS, ACSM RCEP, Exercise Physiologist   Supervising physician immediately available to respond to emergencies [facility] immediately available   Physician(s) Dr. Wendee Beavers   Medication changes reported     No   Fall or balance concerns reported    No   Tobacco Cessation No Change   Warm-up and Cool-down Performed as group-led instruction   Resistance Training Performed Yes   VAD Patient? No     Pain Assessment   Currently in Pain? No/denies   Multiple Pain Sites No      Capillary Blood Glucose: No results found for this or any previous visit (from the past 24 hour(s)).      Exercise Prescription Changes - 02/06/17 1500      Response to Exercise   Blood Pressure (Admit) 96/50   Blood Pressure (Exercise) 132/88   Blood Pressure (Exit) 96/62   Heart Rate (Admit) 100 bpm   Heart Rate (Exercise) 105 bpm   Heart Rate (Exit) 97 bpm   Oxygen Saturation (Admit) 96 %   Oxygen Saturation (Exercise) 94 %   Oxygen Saturation (Exit) 95 %   Rating of Perceived Exertion (Exercise) 9   Perceived Dyspnea (Exercise) 0   Duration Progress to 45 minutes of aerobic exercise without signs/symptoms of physical distress   Intensity THRR unchanged     Progression   Progression Continue to progress workloads to maintain intensity without signs/symptoms of physical distress.     Resistance Training   Training Prescription Yes   Weight orange bands   Reps 10-15   Time 10 Minutes     Interval Training   Interval Training No     NuStep   Level 5   Minutes 34   METs 1.6     Track   Laps 6   Minutes 17      History  Smoking Status  . Former Smoker  . Packs/day: 0.00  . Years: 30.00  . Types: Pipe  . Quit date: 06/24/2008  Smokeless Tobacco  . Never Used    Goals Met:  Exercise tolerated well No report of cardiac concerns or symptoms Strength training completed today  Goals Unmet:  Not Applicable  Comments: Service time is from 1330 to 1500    Dr. Rush Farmer is Medical Director for Pulmonary Rehab at [facility].

## 2017-02-11 ENCOUNTER — Encounter (HOSPITAL_COMMUNITY)
Admission: RE | Admit: 2017-02-11 | Discharge: 2017-02-11 | Disposition: A | Discharge status: Home / Self Care | Payer: Medicare Other | Source: Ambulatory Visit | Attending: Pulmonary Disease | Admitting: Pulmonary Disease

## 2017-02-11 DIAGNOSIS — J438 Other emphysema: Secondary | ICD-10-CM | POA: Diagnosis not present

## 2017-02-11 DIAGNOSIS — D869 Sarcoidosis, unspecified: Secondary | ICD-10-CM

## 2017-02-11 DIAGNOSIS — J9611 Chronic respiratory failure with hypoxia: Secondary | ICD-10-CM

## 2017-02-25 ENCOUNTER — Telehealth: Payer: Self-pay | Admitting: Pulmonary Disease

## 2017-02-25 ENCOUNTER — Encounter (HOSPITAL_COMMUNITY): Payer: Self-pay

## 2017-02-25 DIAGNOSIS — J438 Other emphysema: Secondary | ICD-10-CM

## 2017-02-25 DIAGNOSIS — D869 Sarcoidosis, unspecified: Secondary | ICD-10-CM

## 2017-02-25 DIAGNOSIS — J9611 Chronic respiratory failure with hypoxia: Secondary | ICD-10-CM

## 2017-02-25 DIAGNOSIS — J449 Chronic obstructive pulmonary disease, unspecified: Secondary | ICD-10-CM

## 2017-02-25 NOTE — Telephone Encounter (Signed)
Order has been placed for the pt to get set up with the cont pulmonary rehab.

## 2017-02-25 NOTE — Progress Notes (Signed)
Discharge Progress Report  Patient Details  Name: Travis Ferguson MRN: [id number] Date of Birth: [birth year] Referring Provider:     Pulmonary Rehab Walk Test from 10/17/2016 in [place]  Referring Provider  Dr. Halford Chessman       Number of Visits: 24  Reason for Discharge:  Patient reached a stable level of exercise. Patient independent in their exercise. Patient has met program and personal goals.  Smoking History:  History  Smoking Status  . Former Smoker  . Packs/day: 0.00  . Years: 30.00  . Types: Pipe  . Quit date: 06/24/2008  Smokeless Tobacco  . Never Used    Diagnosis:  Sarcoidosis  Chronic respiratory failure with hypoxia ([place])  Other emphysema ([place])  ADL [facility]:     Pulmonary Assessment Scores    Row Name 02/06/17 1644 02/11/17 1557       ADL [facility]   ADL Phase Exit Exit    SOB Score total 10  -      CAT Score   CAT Score 26  Exit  -      mMRC Score   mMRC Score  - 1       Initial Exercise Prescription:   Discharge Exercise Prescription (Final Exercise Prescription Changes):     Exercise Prescription Changes - 02/06/17 1500      Response to Exercise   Blood Pressure (Admit) 96/50   Blood Pressure (Exercise) 132/88   Blood Pressure (Exit) 96/62   Heart Rate (Admit) 100 bpm   Heart Rate (Exercise) 105 bpm   Heart Rate (Exit) 97 bpm   Oxygen Saturation (Admit) 96 %   Oxygen Saturation (Exercise) 94 %   Oxygen Saturation (Exit) 95 %   Rating of Perceived Exertion (Exercise) 9   Perceived Dyspnea (Exercise) 0   Duration Progress to 45 minutes of aerobic exercise without signs/symptoms of physical distress   Intensity THRR unchanged     Progression   Progression Continue to progress workloads to maintain intensity without signs/symptoms of physical distress.     Resistance Training   Training Prescription Yes   Weight orange bands   Reps 10-15   Time 10 Minutes     Interval Training   Interval Training No      NuStep   Level 5   Minutes 34   METs 1.6     Track   Laps 6   Minutes 17      Functional Capacity:     6 Minute Walk    Row Name 02/11/17 1555         6 Minute Walk   Phase Discharge     Distance 1030 feet     Walk Time 6 minutes     # of Rest Breaks 0     MPH 1.95     METS 2.53     RPE 11     Perceived Dyspnea  0     Symptoms Yes (comment)     Comments chronic pain     Resting HR 105 bpm     Resting BP 98/54     Max Ex. HR 110 bpm     Max Ex. BP 100/60       Interval HR   Baseline HR (retired) 105     1 Minute HR 107     2 Minute HR 106     3 Minute HR 109     4 Minute HR 110  5 Minute HR 109     6 Minute HR 109     2 Minute Post HR 102     Interval Heart Rate? Yes       Interval Oxygen   Interval Oxygen? Yes     Baseline Oxygen Saturation % 91 %     Resting Liters of Oxygen 0 L     1 Minute Oxygen Saturation % 91 %     1 Minute Liters of Oxygen 0 L     2 Minute Oxygen Saturation % 90 %     2 Minute Liters of Oxygen 0 L     3 Minute Oxygen Saturation % 90 %     3 Minute Liters of Oxygen 0 L     4 Minute Oxygen Saturation % 90 %     4 Minute Liters of Oxygen 0 L     5 Minute Oxygen Saturation % 91 %     5 Minute Liters of Oxygen 0 L     6 Minute Oxygen Saturation % 91 %     6 Minute Liters of Oxygen 0 L     2 Minute Post Oxygen Saturation % 96 %     2 Minute Post Liters of Oxygen 0 L        Psychological, QOL, Others - Outcomes: PHQ 2/9: Depression screen [facility] 2/9 02/11/2017 10/14/2016 10/30/2015 06/12/2015 03/13/2015  Decreased Interest 0 0 0 0 0  Down, Depressed, Hopeless 0 0 0 3 1  PHQ - 2 Score 0 0 0 3 1  Altered sleeping - - - 3 -  Tired, decreased energy - - - 3 -  Change in appetite - - - 0 -  Feeling bad or failure about yourself  - - - 0 -  Trouble concentrating - - - 0 -  Moving slowly or fidgety/restless - - - 0 -  Suicidal thoughts - - - 0 -  PHQ-9 Score - - - 9 -  Difficult doing work/chores - - - Somewhat difficult -   Some recent data might be hidden    Quality of Life:   Personal Goals: Goals established at orientation with interventions provided to work toward goal.    Personal Goals Discharge:     Goals and Risk Factor Review    Row Name 01/21/17 1952 02/25/17 0736           Core Components/Risk Factors/Patient Goals Review   Personal Goals Review Develop more efficient breathing techniques such as purse lipped breathing and diaphragmatic breathing and practicing self-pacing with activity.;Improve shortness of breath with ADL's;Increase knowledge of respiratory medications and ability to use respiratory devices properly.  -      Review patient states that he is seeing an improvement in his shortness of breath with home ADLs however the improvement is overshadowed by the constant pain in his left shoulder. This is his second time in the program and he has significantly done better physically and socially/emotionally. He may be at a maintenance phase at this point. Fearful that he will not continue his home exercise post discharge related to his constant pain. will need lost of social support and encouragement prior to graduation. goals met      Expected Outcomes continue to increase workloads as tolerated, continue with group support   -         Exercise Goals and Review:   Nutrition & Weight - Outcomes:    Nutrition:   Nutrition Discharge:  Nutrition Assessments - 02/17/17 1048      Rate Your Plate Scores   Pre Score 51   Post Score 50      Education Questionnaire Score:     Knowledge Questionnaire Score - 02/06/17 1644      Knowledge Questionnaire Score   Post Score 13/13

## 2017-03-03 ENCOUNTER — Encounter (HOSPITAL_COMMUNITY)
Admission: RE | Admit: 2017-03-03 | Discharge: 2017-03-03 | Disposition: A | Discharge status: Home / Self Care | Payer: Medicare Other | Source: Ambulatory Visit | Attending: Pulmonary Disease | Admitting: Pulmonary Disease

## 2017-03-03 DIAGNOSIS — J438 Other emphysema: Secondary | ICD-10-CM | POA: Insufficient documentation

## 2017-03-05 ENCOUNTER — Encounter (HOSPITAL_COMMUNITY): Payer: Medicare Other

## 2017-03-07 ENCOUNTER — Encounter (HOSPITAL_COMMUNITY): Payer: Medicare Other

## 2017-03-10 ENCOUNTER — Encounter (HOSPITAL_COMMUNITY): Payer: Medicare Other

## 2017-03-12 ENCOUNTER — Encounter (HOSPITAL_COMMUNITY): Payer: Medicare Other

## 2017-03-12 ENCOUNTER — Encounter: Payer: Self-pay | Admitting: Pulmonary Disease

## 2017-03-12 ENCOUNTER — Ambulatory Visit (INDEPENDENT_AMBULATORY_CARE_PROVIDER_SITE_OTHER): Payer: Medicare Other | Admitting: Pulmonary Disease

## 2017-03-12 VITALS — BP 130/74 | HR 103 | Ht 71.0 in | Wt 268.0 lb

## 2017-03-12 DIAGNOSIS — J449 Chronic obstructive pulmonary disease, unspecified: Secondary | ICD-10-CM | POA: Diagnosis not present

## 2017-03-12 DIAGNOSIS — J9611 Chronic respiratory failure with hypoxia: Secondary | ICD-10-CM

## 2017-03-12 DIAGNOSIS — R05 Cough: Secondary | ICD-10-CM | POA: Diagnosis not present

## 2017-03-12 DIAGNOSIS — R058 Other specified cough: Secondary | ICD-10-CM

## 2017-03-12 DIAGNOSIS — D860 Sarcoidosis of lung: Secondary | ICD-10-CM | POA: Diagnosis not present

## 2017-03-12 DIAGNOSIS — J438 Other emphysema: Secondary | ICD-10-CM | POA: Diagnosis not present

## 2017-03-12 DIAGNOSIS — J4489 Other specified chronic obstructive pulmonary disease: Secondary | ICD-10-CM

## 2017-03-12 MED ORDER — BUDESONIDE-FORMOTEROL FUMARATE 160-4.5 MCG/ACT IN AERO: 2.0000 | INHALATION_SPRAY | Freq: Two times a day (BID) | RESPIRATORY_TRACT | 0 refills | Status: DC

## 2017-03-12 NOTE — Patient Instructions (Signed)
Use nasacort or flonase 1 spray in each nostril nightly  Keep using symbicort until you feel better  Follow up in 6 months

## 2017-03-12 NOTE — Progress Notes (Signed)
Current Outpatient Prescriptions on File Prior to Visit  Medication Sig  . DULoxetine (CYMBALTA) 30 MG capsule Take 30 mg by mouth daily.  [name] ENTRESTO 24-26 MG TAKE 1 TABLET BY MOUTH 2 (TWO) TIMES DAILY.  . finasteride (PROSCAR) 5 MG tablet Take 5 mg by mouth daily.   [name] FLUoxetine (PROZAC) 40 MG capsule Take 40 mg by mouth daily with breakfast.   . furosemide (LASIX) 20 MG tablet Take 1 tablet (20 mg total) by mouth daily.  [name] HYDROcodone-acetaminophen (NORCO/VICODIN) 5-325 MG tablet Take 1 tablet by mouth every 6 (six) hours as needed for moderate pain.  [name] ipratropium (ATROVENT) 0.02 % nebulizer solution Take 2.5 mLs (0.5 mg total) by nebulization 4 (four) times daily.  . metoprolol tartrate (LOPRESSOR) 25 MG tablet TAKE 1 TABLET (25 MG TOTAL) BY MOUTH 2 (TWO) TIMES DAILY.  . Multiple Vitamin (MULTIVITAMIN) tablet Take 1 tablet by mouth daily.  . OXYGEN Inhale 2 L/min into the lungs at bedtime.  [name] PROAIR HFA 108 (90 BASE) MCG/ACT inhaler 1-2 PUFFS EVERY 4-6 HOURS AS NEEDED (Patient taking differently: 1-2 PUFFS EVERY 4-6 HOURS AS NEEDED for shortness of breath)  . simvastatin (ZOCOR) 20 MG tablet Take 20 mg by mouth every evening.   . sodium chloride (OCEAN) 0.65 % SOLN nasal spray Place 1-2 sprays into both nostrils daily as needed for congestion.  [name] spironolactone (ALDACTONE) 25 MG tablet Take 1 tablet (25 mg total) by mouth daily.  . SYMBICORT 160-4.5 MCG/ACT inhaler INHALE 2 PUFFS INTO THE LUNGS 2 (TWO) TIMES DAILY.  . tamsulosin (FLOMAX) 0.4 MG CAPS capsule Take 0.4 mg by mouth daily.   No current facility-administered medications on file prior to visit.     Chief Complaint  Patient presents with  . Follow-up    Pt wheezing has worsen over last week. Pt at O2 on 2 liters at nightly only. Pt has minor coughing, undergoing severe body pain, and tired more often than usual.     Sleep tests PSG May 2008>> AHI 8   Pulmonary tests Spirometry 11/18/08>>FEV1 2.15 (61%), FEV1% 62   Bronchoscopy 07/28/12 >> focal granulomatous inflammation Spirometry 03/14/14 >> FEV1 2.10 (60%), FEV1% 57  Cardiac tests Echo 08/19/14 >> EF 50 to 55%, grade 1 DD Echo 04/19/15 >> EF 30 to 35% Echo 10/27/15 >> EF 35 to 40%, grade 1 DD  Chest imaging CT chest 05/19/12>>superior segment LLL infiltrate, b/l hilar and mediastinal LAN with calcification CT chest 07/13/12 >> atherosclerosis, Lt hilar adenopathy, LLL superior segment narrowing, mass like opacity superior segment LLL 5.4 x 1.9 cm, multiple nodular areas LLL, diffuse GGO LLL PET scan 07/22/12 >> mildly hypermetabolic mediastinal adenopathy (SUV 5.8), superior segment nodules hypermetabolic (SUV 09.3) CT chest 08/31/12 >> decreased size of lesions CT chest 12/02/12 >> borderline mediastinal/hilar LAN, nodular septal thickening b/l, no change superior segment LLL, centrilobular emphysema CT chest 03/19/13 >> ?slight increase in LLL consolidative change, borderline LAN CT chest 08/24/13 >> 1.9 cm Rt paratracheal LAN, calcified 2.1 cm subcarinal LAN, narrowing of mainstem bronchi, chronic infiltrate LLL CT chest 03/16/14 >> atherosclerosis, no change in mediastinal and b/l hilar LAN, no change in LLL mass like opacity, thickening of interstitium, scattered sub-pleural nodules, small Lt pleural effusion, gallstones CT chest 08/19/14 >> small b/l effusions, emphysema, no change LLL HRCT chest 07/12/16 >> mild/mod centrilobular emphysema, interstitial thickening, confluent nodularity LLL stable, calcified mediastinal and b/l hilar LAN  Past medical history Depression, PSTD, Migraines, combined CHF, CAD, HTN, HLD, Back pain, SDH  2010, Pseudomonal PNA 2016  Past surgical history, Family history, Social history, Allergies reviewed  Vital signs BP 130/74 (BP Location: Left Arm, Cuff Size: Normal)   Pulse (!) 103   Ht 5\' 11"  (1.803 m)   Wt 268 lb (121.6 kg)   SpO2 93%   BMI 37.38 kg/m   History of Present Illness: Travis Ferguson is a 76 y.o.  male former smoker with COPD, and sarcoidosis.  He has noticed more trouble with a cough.  He has been getting sinus congestion and post nasal drip.  He has noticed more wheezing, but this seems to be coming more from his throat.  However, he is also getting some wheeze in his chest.  He is not having fever, and not much sputum.  He continues to have significant pain symptoms.    Physical Exam:  General - pleasant Eyes - pupils reactive, wears glasses ENT - no sinus tenderness, no oral exudate, no LAN, raspy voice Cardiac - regular, no murmur Chest - no wheeze, rales Abd - soft, non tender Ext - no edema Skin - no rashes Neuro - normal strength Psych - normal mood    Assessment/Plan:  COPD with chronic bronchitis. - advised him to use symbicort on regular basis for now - continue prn albuterol  Chronic respiratory failure with hypoxia. - Related to COPD, pulmonary sarcoidosis, presumed OSA - continue 2 liters oxygen at night  Pulmonary sarcoidosis. - stable on most recent imaging studies  Upper airway cough syndrome with post nasal drip. - advised him to use flonase  Combined heart failure. - f/u with cardiology   Patient Instructions  Use nasacort or flonase 1 spray in each nostril nightly  Keep using symbicort until you feel better  Follow up in 6 months    Travis Mires, MD [facility] Pager:  [phone] 03/12/2017, 1:46 PM

## 2017-03-14 ENCOUNTER — Encounter (HOSPITAL_COMMUNITY): Payer: Medicare Other

## 2017-03-17 ENCOUNTER — Encounter (HOSPITAL_COMMUNITY): Payer: Medicare Other

## 2017-03-18 ENCOUNTER — Encounter (HOSPITAL_COMMUNITY): Payer: Self-pay | Admitting: *Deleted

## 2017-03-18 NOTE — Progress Notes (Signed)
Travis Ferguson came in for orientation 03/03/17 and participated in exercise that day which he tolerated well. He then decided that he would not participate in the program due to the parking situation.

## 2017-03-19 ENCOUNTER — Encounter (HOSPITAL_COMMUNITY): Payer: Medicare Other

## 2017-03-21 ENCOUNTER — Encounter (HOSPITAL_COMMUNITY): Payer: Medicare Other

## 2017-03-24 ENCOUNTER — Encounter (HOSPITAL_COMMUNITY): Payer: Medicare Other

## 2017-03-26 ENCOUNTER — Encounter (HOSPITAL_COMMUNITY): Payer: Medicare Other

## 2017-03-28 ENCOUNTER — Encounter (HOSPITAL_COMMUNITY): Payer: Medicare Other

## 2017-03-28 ENCOUNTER — Ambulatory Visit (INDEPENDENT_AMBULATORY_CARE_PROVIDER_SITE_OTHER): Payer: Medicare Other | Admitting: Cardiovascular Disease

## 2017-03-28 ENCOUNTER — Encounter: Payer: Self-pay | Admitting: Cardiovascular Disease

## 2017-03-28 VITALS — BP 124/84 | HR 100 | Wt 265.6 lb

## 2017-03-28 DIAGNOSIS — G4733 Obstructive sleep apnea (adult) (pediatric): Secondary | ICD-10-CM | POA: Diagnosis not present

## 2017-03-28 DIAGNOSIS — I2510 Atherosclerotic heart disease of native coronary artery without angina pectoris: Secondary | ICD-10-CM

## 2017-03-28 DIAGNOSIS — I10 Essential (primary) hypertension: Secondary | ICD-10-CM

## 2017-03-28 DIAGNOSIS — I519 Heart disease, unspecified: Secondary | ICD-10-CM | POA: Diagnosis not present

## 2017-03-28 DIAGNOSIS — E7800 Pure hypercholesterolemia, unspecified: Secondary | ICD-10-CM | POA: Diagnosis not present

## 2017-03-28 NOTE — Patient Instructions (Signed)

## 2017-03-28 NOTE — Assessment & Plan Note (Signed)
History of hyperlipidemia not on statin therapy followed by his PCP 

## 2017-03-28 NOTE — Assessment & Plan Note (Signed)
History of essential hypertension blood pressure measures 124/84. He is on metoprolol and Entresto s well as Aldactone. Continue current meds at current dosing

## 2017-03-28 NOTE — Assessment & Plan Note (Signed)
History of obstructive sleep apnea not on CPAP but he is on 2 L nasal oxygen daily at bedtime

## 2017-03-28 NOTE — Assessment & Plan Note (Signed)
History of moderate LV dysfunction with EF measured by 2-D echo 05/03/16 of 40-45% moderate LVH.

## 2017-03-28 NOTE — Assessment & Plan Note (Signed)
History of mild CAD by cath which I performed 01/23/15.

## 2017-03-28 NOTE — Progress Notes (Signed)
03/28/2017 Travis Ferguson   04/19/1941  [id number]  Primary Physician Crist Infante, MD Primary Cardiologist: Lorretta Harp MD Lupe Carney, [place]  HPI:  Travis Ferguson is a 76 y.o. male  moderately overweight married Caucasian male, father of 42, grandfather to 1 grandchild, who is a retired Catering manager. He is referred through the courtesy of Dr. Joylene Draft for cardiovascular evaluation because of chronic dyspnea and exertional chest pressure. I last saw him in the office 10/31/15.[name]  His cardiac risk factor profile is remarkable for remote discontinued tobacco abuse back in 1996. He smoked a pipe at that time. He has treated hypertension and hyperlipidemia. He does have a family history of heart disease with a brother who had bypass surgery in his mid 6s. He has never had a heart attack or stroke. His past surgical history is remarkable for subdural hematoma and brain surgery in the past. He has been worked up by Dr. Halford Chessman for question of lung CA, which has never been clinically documented. He did have a chest CT that showed, among other things, plaque and atherosclerosis in his coronary arteries.   The patient recently had a 2-D echocardiogram and Myoview stress test which showed normal left for function and no evidence of ischemia. He did have grade 1 diastolic dysfunction. He denies chest pain. 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  He had cholecystectomy in early May for gangrenous gallbladder with prolonged hospitalization. He was recently noted to be more short of breath in usual and a BNP was ordered that was in the 500 range. He was treated with oral diuretics which resulted in marked improvement in his symptoms.  A follow-up 2-D echocardiogram showed an EF of 20 or 25% with severe global hypokinesia as well as segmental wall motion abnormalities. Because of  the decline in his ejection fraction he underwent right and left heart cath by myself 01/23/15 revealing minimal CAD, elevated filling pressures and severe LV dysfunction. I offered him the option of having an ICD placed for primary prevention and referred him to Dr. Erline Hau he has happily decided not to pursue this. He is participating in cardiac rehabilitation.his most recent 2-D echo performed 04/19/15 revealed persistent LV dysfunction with an EF in the 30% range despite optimal medical therapy.  He recently underwent right shoulder repair by Dr. Veverly Fells and has continued right arm pain. Also sounds like he has a cervical radiculopathy. He is participating in pulmonary rehabilitation. A recent 2-D echocardiogram performed 05/03/16 revealed slight improvement in his ejection fraction up to 40-45%.  Since I saw him back here ago he's remained stable. He is in a lot of pain from bursitis and various arthritic complaints. He denies chest pain. He is chronically short of breath. He has COPD and pulmonary sarcoid as well.    Current Meds  Medication Sig  . budesonide-formoterol (SYMBICORT) 160-4.5 MCG/ACT inhaler Inhale 2 puffs into the lungs 2 (two) times daily.  . DULoxetine (CYMBALTA) 30 MG capsule Take 30 mg by mouth daily.  [name] ENTRESTO 24-26 MG TAKE 1 TABLET BY MOUTH 2 (TWO) TIMES DAILY.  . finasteride (PROSCAR) 5 MG tablet Take 5 mg by mouth daily.   [name] FLUoxetine (PROZAC) 40 MG capsule Take 40 mg by mouth daily with breakfast.   . furosemide (LASIX) 20 MG tablet Take 1 tablet (20 mg total) by  mouth daily.  [name] HYDROcodone-acetaminophen (NORCO/VICODIN) 5-325 MG tablet Take 1 tablet by mouth every 6 (six) hours as needed for moderate pain.  [name] ipratropium (ATROVENT) 0.02 % nebulizer solution Take 2.5 mLs (0.5 mg total) by nebulization 4 (four) times daily.  . metoprolol tartrate (LOPRESSOR) 25 MG tablet TAKE 1 TABLET (25 MG TOTAL) BY MOUTH 2 (TWO) TIMES DAILY.  . Multiple Vitamin  (MULTIVITAMIN) tablet Take 1 tablet by mouth daily.  . OXYGEN Inhale 2 L/min into the lungs at bedtime.  [name] PROAIR HFA 108 (90 BASE) MCG/ACT inhaler 1-2 PUFFS EVERY 4-6 HOURS AS NEEDED (Patient taking differently: 1-2 PUFFS EVERY 4-6 HOURS AS NEEDED for shortness of breath)  . simvastatin (ZOCOR) 20 MG tablet Take 20 mg by mouth every evening.   . sodium chloride (OCEAN) 0.65 % SOLN nasal spray Place 1-2 sprays into both nostrils daily as needed for congestion.  [name] spironolactone (ALDACTONE) 25 MG tablet Take 1 tablet (25 mg total) by mouth daily.  . SYMBICORT 160-4.5 MCG/ACT inhaler INHALE 2 PUFFS INTO THE LUNGS 2 (TWO) TIMES DAILY.  . tamsulosin (FLOMAX) 0.4 MG CAPS capsule Take 0.4 mg by mouth daily.     Allergies  Allergen Reactions  . Statins Other (See Comments)    Muscle aches - tolerating Vytorin    Social History   Social History  . Marital status: Married    Spouse name: N/A  . Number of children: 2  . Years of education: 14   Occupational History  . retired Airline pilot in [place]  . Smoking status: Former Smoker    Packs/day: 0.00    Years: 30.00    Types: Pipe    Quit date: 06/24/2008  . Smokeless tobacco: Never Used  . Alcohol use No  . Drug use: No  . Sexual activity: Not Currently   Other Topics Concern  . Not on file   Social History Narrative   Drinks about 6-7 caffeine drinks a day      Review of Systems: General: negative for chills, fever, night sweats or weight changes.  Cardiovascular: negative for chest pain, dyspnea on exertion, edema, orthopnea, palpitations, paroxysmal nocturnal dyspnea or shortness of breath Dermatological: negative for rash Respiratory: negative for cough or wheezing Urologic: negative for hematuria Abdominal: negative for nausea, vomiting, diarrhea, bright red blood per rectum, melena, or hematemesis Neurologic: negative for visual changes, syncope, or dizziness All other systems reviewed and are  otherwise negative except as noted above.    Blood pressure 124/84, pulse 100, weight 265 lb 9.6 oz (120.5 kg).  General appearance: alert and no distress Neck: no adenopathy, no carotid bruit, no JVD, supple, symmetrical, trachea midline and thyroid not enlarged, symmetric, no tenderness/mass/nodules Lungs: clear to auscultation bilaterally Heart: regular rate and rhythm, S1, S2 normal, no murmur, click, rub or gallop Extremities: extremities normal, atraumatic, no cyanosis or edema Pulses: 2+ and symmetric Skin: Skin color, texture, turgor normal. No rashes or lesions Neurologic: Alert and oriented X 3, normal strength and tone. Normal symmetric reflexes. Normal coordination and gait  EKG sinus tachycardia 100 with PACs and PVCs. I personally reviewed this EKG.  ASSESSMENT AND PLAN:   Obstructive sleep apnea History of obstructive sleep apnea not on CPAP but he is on 2 L nasal oxygen daily at bedtime  Coronary artery calcification seen on CAT scan History of mild CAD by cath which I performed 01/23/15.  Essential hypertension History of essential hypertension blood pressure measures 124/84. He  is on metoprolol and Entresto s well as Aldactone. Continue current meds at current dosing  Hyperlipidemia History of hyperlipidemia not on statin therapy followed by his PCP  Left ventricular dysfunction History of moderate LV dysfunction with EF measured by 2-D echo 05/03/16 of 40-45% moderate LVH.      Lorretta Harp MD FACP,FACC,FAHA, [facility] 03/28/2017 4:09 PM

## 2017-03-31 ENCOUNTER — Encounter (HOSPITAL_COMMUNITY): Payer: Medicare Other

## 2017-04-01 ENCOUNTER — Telehealth: Payer: Self-pay | Admitting: Pulmonary Disease

## 2017-04-01 NOTE — Telephone Encounter (Signed)
Spoke with patient. He stated that he has been coughing non stop for the past few days. Not able to sleep at night due to the cough. He is currently dealing bursitis in one of his knees and had a cyst removed from his buttlocks last week. He is willing to come in tomorrow.   He does have a history of COPD. TP has a RNA spot tomorrow morning at 945. Jess, is it ok to use this spot? Please advise. Thanks!

## 2017-04-01 NOTE — Telephone Encounter (Signed)
Pt has been scheduled for acute visit with TP on 04/02/17 @ 9:30. Nothing further needed.

## 2017-04-01 NOTE — Telephone Encounter (Signed)
Yes that is fine to see tomorrow.  Please contact office for sooner follow up if symptoms do not improve or worsen or seek emergency care

## 2017-04-02 ENCOUNTER — Encounter: Payer: Self-pay | Admitting: Adult Health

## 2017-04-02 ENCOUNTER — Encounter (HOSPITAL_COMMUNITY): Payer: Medicare Other

## 2017-04-02 ENCOUNTER — Ambulatory Visit (INDEPENDENT_AMBULATORY_CARE_PROVIDER_SITE_OTHER): Payer: Medicare Other | Admitting: Adult Health

## 2017-04-02 DIAGNOSIS — J9611 Chronic respiratory failure with hypoxia: Secondary | ICD-10-CM

## 2017-04-02 DIAGNOSIS — J449 Chronic obstructive pulmonary disease, unspecified: Secondary | ICD-10-CM | POA: Diagnosis not present

## 2017-04-02 MED ORDER — DOXYCYCLINE HYCLATE 100 MG PO TABS: 100.0000 mg | ORAL_TABLET | Freq: Two times a day (BID) | ORAL | 0 refills | Status: DC

## 2017-04-02 MED ORDER — PREDNISONE 10 MG PO TABS: ORAL_TABLET | ORAL | 0 refills | Status: DC

## 2017-04-02 NOTE — Assessment & Plan Note (Signed)
Cont on O2 At bedtime   

## 2017-04-02 NOTE — Progress Notes (Signed)
@Patient  ID: Travis Ferguson, male    DOB: [birth year], 76 y.o.   MRN: [id number]  Chief Complaint  Patient presents with  . Acute Visit    'cough     Referring provider: Crist Infante, MD  HPI: 76 year old male former smoker followed for COPD and sarcoidosis, chronic respiratory failure with hypoxemia on nocturnal oxygen  CHF hx   Sleep tests PSG May 2008>> AHI 8   Pulmonary tests Spirometry 11/18/08>>FEV1 2.15 (61%), FEV1% 62  Bronchoscopy 07/28/12 >> focal granulomatous inflammation Spirometry 03/14/14 >> FEV1 2.10 (60%), FEV1% 57  Cardiac tests Echo 08/19/14 >> EF 50 to 55%, grade 1 DD Echo 04/19/15 >> EF 30 to 35% Echo 10/27/15 >> EF 35 to 40%, grade 1 DD  Chest imaging CT chest 05/19/12>>superior segment LLL infiltrate, b/l hilar and mediastinal LAN with calcification CT chest 07/13/12 >> atherosclerosis, Lt hilar adenopathy, LLL superior segment narrowing, mass like opacity superior segment LLL 5.4 x 1.9 cm, multiple nodular areas LLL, diffuse GGO LLL PET scan 07/22/12 >> mildly hypermetabolic mediastinal adenopathy (SUV 5.8), superior segment nodules hypermetabolic (SUV 62.1) CT chest 08/31/12 >> decreased size of lesions CT chest 12/02/12 >> borderline mediastinal/hilar LAN, nodular septal thickening b/l, no change superior segment LLL, centrilobular emphysema CT chest 03/19/13 >> ?slight increase in LLL consolidative change, borderline LAN CT chest 08/24/13 >> 1.9 cm Rt paratracheal LAN, calcified 2.1 cm subcarinal LAN, narrowing of mainstem bronchi, chronic infiltrate LLL CT chest 03/16/14 >> atherosclerosis, no change in mediastinal and b/l hilar LAN, no change in LLL mass like opacity, thickening of interstitium, scattered sub-pleural nodules, small Lt pleural effusion, gallstones CT chest 08/19/14 >> small b/l effusions, emphysema, no change LLL HRCT chest 07/12/16 >> mild/mod centrilobular emphysema, interstitial thickening, confluent nodularity LLL stable, calcified  mediastinal and b/l hilar LAN   04/02/2017 Acute OV : Cough /COPD  Patient presents for an acute office visit. Patient complains of 2 weeks of cough , minimally productive,  severe coughing fits, nasal drainage/stuffiness, wheezing . Worse for last 3 days.  Using mucinex DM without much help.  Remains on Symbicort 2 puffs twice daily. Previously on Atrovent nebs, did not feel it helped.  On oxygen 2l/m At bedtime   Had cyst removed last week on day 6/7 Doxycycline.  Good appetite with no n/v/d.   On ENTRESTO for CHF.  Allergies  Allergen Reactions  . Statins Other (See Comments)    Muscle aches - tolerating Vytorin    Immunization History  Administered Date(s) Administered  . Influenza Split 01/23/2011, 03/24/2013, 04/05/2014, 03/06/2015  . Influenza Whole 03/27/2009, 03/16/2010, 02/23/2012  . Influenza, High Dose Seasonal PF 02/19/2016  . Influenza-Unspecified 03/05/2017  . Pneumococcal Polysaccharide-23 04/27/2007, 10/22/2012    Past Medical History:  Diagnosis Date  . Acute on chronic combined systolic and diastolic CHF (congestive heart failure) ([place]) 02/24/2015  . Anxiety   . Arthritis    "about q joint I've got" (09/22/2015)  . Atherosclerosis   . Calculus of gallbladder with acute cholecystitis and obstruction   . CAP (community acquired pneumonia) 02/24/2015  . Cervical disc disease   . Chronic lower back pain    chronic back pain/under pain management  . Colon polyps    adenomatous and hyperplastic  . Colon polyps 08/02/2014   Tubular adenoma x 3, Hyperplastic-1  . COPD (chronic obstructive pulmonary disease) ([place])    a. Former Airline pilot, also smoked a pipe.  . Coronary artery calcification seen on CAT scan   . Depression   .  Diastolic dysfunction    grade 1 diastolic dysfunction on 2-D echo  . Dyslipidemia   . Dyspnea    chronic  . Encounter for biliary drainage tube placement    remains with drainage bag to right side for  biliary drainage.  . Frequent  urination   . Gallstones   . Gangrenous cholecystitis 08/17/2014  . GERD (gastroesophageal reflux disease)   . Hiatal hernia    sensation problem ("right lateral side hip to knee").  . History of blood transfusion "numerous"  . History of oxygen administration    @ 2 l/m nasally at bedtime.(09/22/2015)  . Hypertension   . Inguinal hernia    right side  . Left ventricular dysfunction    ejection fraction of 25-30% with regional wall motion abnormalities.  . Migraine    "years since I've had one" (09/22/2015)  . OSA (obstructive sleep apnea)    does not use CPAP; "just oxygen" (09/22/2015)  . Pneumonia ~ 1958  . PTSD (post-traumatic stress disorder)   . Rhinitis   . Sarcoidosis   . Sensation problem    right side-lateral hip to knee" decreased sensation and tingling feeling" -has informed Dr. Joylene Draft, Dr. Henrene Pastor, Dr. Lucia Gaskins of this.  . Sepsis ([place])   . Sinus congestion    10-27-14 at present some issues-"not bad"  . Steroid-induced hyperglycemia 02/25/2015  . Subdural hematoma ([place]) 11/10   a. 2010 s/p surgery - diagnosed several weeks after a fall.    Tobacco History: History  Smoking Status  . Former Smoker  . Packs/day: 0.00  . Years: 30.00  . Types: Pipe  . Quit date: 06/24/2008  Smokeless Tobacco  . Never Used   Counseling given: Not Answered   Outpatient Encounter Prescriptions as of 04/02/2017  Medication Sig  . budesonide-formoterol (SYMBICORT) 160-4.5 MCG/ACT inhaler Inhale 2 puffs into the lungs 2 (two) times daily.  . DULoxetine (CYMBALTA) 30 MG capsule Take 30 mg by mouth daily.  [name] ENTRESTO 24-26 MG TAKE 1 TABLET BY MOUTH 2 (TWO) TIMES DAILY.  . finasteride (PROSCAR) 5 MG tablet Take 5 mg by mouth daily.   [name] FLUoxetine (PROZAC) 40 MG capsule Take 40 mg by mouth daily with breakfast.   . furosemide (LASIX) 20 MG tablet Take 1 tablet (20 mg total) by mouth daily.  [name] HYDROcodone-acetaminophen (NORCO/VICODIN) 5-325 MG tablet Take 1 tablet by mouth every 6 (six) hours  as needed for moderate pain.  [name] ipratropium (ATROVENT) 0.02 % nebulizer solution Take 2.5 mLs (0.5 mg total) by nebulization 4 (four) times daily.  . metoprolol tartrate (LOPRESSOR) 25 MG tablet TAKE 1 TABLET (25 MG TOTAL) BY MOUTH 2 (TWO) TIMES DAILY.  . Multiple Vitamin (MULTIVITAMIN) tablet Take 1 tablet by mouth daily.  . OXYGEN Inhale 2 L/min into the lungs at bedtime.  [name] PROAIR HFA 108 (90 BASE) MCG/ACT inhaler 1-2 PUFFS EVERY 4-6 HOURS AS NEEDED (Patient taking differently: 1-2 PUFFS EVERY 4-6 HOURS AS NEEDED for shortness of breath)  . simvastatin (ZOCOR) 20 MG tablet Take 20 mg by mouth every evening.   . sodium chloride (OCEAN) 0.65 % SOLN nasal spray Place 1-2 sprays into both nostrils daily as needed for congestion.  [name] spironolactone (ALDACTONE) 25 MG tablet Take 1 tablet (25 mg total) by mouth daily.  . SYMBICORT 160-4.5 MCG/ACT inhaler INHALE 2 PUFFS INTO THE LUNGS 2 (TWO) TIMES DAILY.  . tamsulosin (FLOMAX) 0.4 MG CAPS capsule Take 0.4 mg by mouth daily.  [name] doxycycline (VIBRA-TABS) 100 MG tablet Take  1 tablet (100 mg total) by mouth 2 (two) times daily.  . predniSONE (DELTASONE) 10 MG tablet 4 tabs for 2 days, then 3 tabs for 2 days, 2 tabs for 2 days, then 1 tab for 2 days, then stop   No facility-administered encounter medications on file as of 04/02/2017.      Review of Systems  Constitutional:   No  weight loss, night sweats,  Fevers, chills, fatigue, or  lassitude.  HEENT:   No headaches,  Difficulty swallowing,  Tooth/dental problems, or  Sore throat,                No sneezing, itching, ear ache, + nasal congestion, post nasal drip,   CV:  No chest pain,  Orthopnea, PND, swelling in lower extremities, anasarca, dizziness, palpitations, syncope.   GI  No heartburn, indigestion, abdominal pain, nausea, vomiting, diarrhea, change in bowel habits, loss of appetite, bloody stools.   Resp:    No chest wall deformity  Skin: no rash or lesions.  GU: no dysuria, change  in color of urine, no urgency or frequency.  No flank pain, no hematuria   MS:  +chronic joint pain    Physical Exam  BP 124/72 (BP Location: Left Arm, Cuff Size: Normal)   Pulse 100   Ht 5\' 11"  (1.803 m)   Wt 264 lb 12.8 oz (120.1 kg)   SpO2 95%   BMI 36.93 kg/m   GEN: A/Ox3; pleasant , NAD, obese , walks with cane    HEENT:  [place]/AT,  EACs-clear, TMs-wnl, NOSE-clear, THROAT-clear, no lesions, no postnasal drip or exudate noted.   NECK:  Supple w/ fair ROM; no JVD; normal carotid impulses w/o bruits; no thyromegaly or nodules palpated; no lymphadenopathy.    RESP  Few trace wheezes , no  accessory muscle use, no dullness to percussion  CARD:  RRR, no m/r/g, tr  peripheral edema, pulses intact, no cyanosis or clubbing.  GI:   Soft & nt; nml bowel sounds; no organomegaly or masses detected.   Musco: Warm bil, no deformities or joint swelling noted.   Neuro: alert, no focal deficits noted.    Skin: Warm, no lesions or rashes    Lab Results:  CBC  BMET  Imaging: No results found.   Assessment & Plan:   COPD with chronic bronchitis Exacerbation   Plan Patient Instructions  Extend Doxycycline for 3 days . Take with food.  Mucinex DM Twice daily  As needed  Cough/congestion  Prednisone taper over next week.  Saline nasal rinses As needed   Please contact office for sooner follow up if symptoms do not improve or worsen or seek emergency care  Follow up with Dr. Halford Chessman in 6 months and As needed         Rexene Edison, NP 04/02/2017

## 2017-04-02 NOTE — Assessment & Plan Note (Signed)
Exacerbation   Plan Patient Instructions  Extend Doxycycline for 3 days . Take with food.  Mucinex DM Twice daily  As needed  Cough/congestion  Prednisone taper over next week.  Saline nasal rinses As needed   Please contact office for sooner follow up if symptoms do not improve or worsen or seek emergency care  Follow up with Dr. Halford Chessman in 6 months and As needed

## 2017-04-02 NOTE — Patient Instructions (Signed)
Extend Doxycycline for 3 days . Take with food.  Mucinex DM Twice daily  As needed  Cough/congestion  Prednisone taper over next week.  Saline nasal rinses As needed   Please contact office for sooner follow up if symptoms do not improve or worsen or seek emergency care  Follow up with Dr. Halford Chessman in 6 months and As needed

## 2017-04-02 NOTE — Progress Notes (Signed)
I have reviewed and agree with assessment/plan.  Chesley Mires, MD [facility] Pulmonary/Critical Care 04/02/2017, 12:44 PM Pager:  [phone]

## 2017-04-04 ENCOUNTER — Encounter (HOSPITAL_COMMUNITY): Payer: Medicare Other

## 2017-04-07 ENCOUNTER — Encounter (HOSPITAL_COMMUNITY): Payer: Medicare Other

## 2017-04-09 ENCOUNTER — Encounter (HOSPITAL_COMMUNITY): Payer: Medicare Other

## 2017-04-11 ENCOUNTER — Encounter (HOSPITAL_COMMUNITY): Payer: Medicare Other

## 2017-04-14 ENCOUNTER — Encounter (HOSPITAL_COMMUNITY): Payer: Medicare Other

## 2017-04-16 ENCOUNTER — Encounter (HOSPITAL_COMMUNITY): Payer: Medicare Other

## 2017-04-18 ENCOUNTER — Encounter (HOSPITAL_COMMUNITY): Payer: Medicare Other

## 2017-04-21 ENCOUNTER — Encounter (HOSPITAL_COMMUNITY): Payer: Medicare Other

## 2017-04-23 ENCOUNTER — Encounter (HOSPITAL_COMMUNITY): Payer: Medicare Other

## 2017-04-25 ENCOUNTER — Encounter (HOSPITAL_COMMUNITY): Payer: Medicare Other

## 2017-04-28 ENCOUNTER — Encounter (HOSPITAL_COMMUNITY): Payer: Medicare Other

## 2017-04-30 ENCOUNTER — Encounter (HOSPITAL_COMMUNITY): Payer: Medicare Other

## 2017-05-02 ENCOUNTER — Encounter (HOSPITAL_COMMUNITY): Payer: Medicare Other

## 2017-05-05 ENCOUNTER — Encounter (HOSPITAL_COMMUNITY): Payer: Medicare Other

## 2017-05-07 ENCOUNTER — Encounter (HOSPITAL_COMMUNITY): Payer: Medicare Other

## 2017-05-09 ENCOUNTER — Encounter (HOSPITAL_COMMUNITY): Payer: Medicare Other

## 2017-07-15 ENCOUNTER — Encounter: Payer: Self-pay | Admitting: Adult Health

## 2017-07-15 ENCOUNTER — Ambulatory Visit: Payer: Medicare Other | Admitting: Adult Health

## 2017-07-15 VITALS — BP 108/64 | Ht 71.0 in | Wt 257.2 lb

## 2017-07-15 DIAGNOSIS — J9611 Chronic respiratory failure with hypoxia: Secondary | ICD-10-CM

## 2017-07-15 DIAGNOSIS — J441 Chronic obstructive pulmonary disease with (acute) exacerbation: Secondary | ICD-10-CM | POA: Diagnosis not present

## 2017-07-15 DIAGNOSIS — J449 Chronic obstructive pulmonary disease, unspecified: Secondary | ICD-10-CM

## 2017-07-15 MED ORDER — PREDNISONE 10 MG PO TABS: ORAL_TABLET | ORAL | 0 refills | Status: DC

## 2017-07-15 MED ORDER — IPRATROPIUM BROMIDE 0.02 % IN SOLN: 0.5000 mg | Freq: Four times a day (QID) | RESPIRATORY_TRACT | 12 refills | Status: AC

## 2017-07-15 MED ORDER — AMOXICILLIN-POT CLAVULANATE 875-125 MG PO TABS: 1.0000 | ORAL_TABLET | Freq: Two times a day (BID) | ORAL | 0 refills | Status: AC

## 2017-07-15 MED ORDER — LEVALBUTEROL HCL 0.63 MG/3ML IN NEBU
0.6300 mg | INHALATION_SOLUTION | Freq: Once | RESPIRATORY_TRACT | Status: AC
Start: 2017-07-15 — End: 2017-07-15
  Administered 2017-07-15: 0.63 mg via RESPIRATORY_TRACT

## 2017-07-15 MED ORDER — BUDESONIDE-FORMOTEROL FUMARATE 160-4.5 MCG/ACT IN AERO: 2.0000 | INHALATION_SPRAY | Freq: Two times a day (BID) | RESPIRATORY_TRACT | 0 refills | Status: DC

## 2017-07-15 NOTE — Patient Instructions (Addendum)
Augmentin 875mg  Twice daily  For 1 week , take with food  Mucinex DM Twice daily  As needed  Cough/congestion  Prednisone taper over next week.  Restart Ipratropium Neb Four times a day  .  Continue on Symbicort 2 puffs Twice daily  , rinse after use.  Please contact office for sooner follow up if symptoms do not improve or worsen or seek emergency care  Follow up with Dr. Halford Chessman in 3 months and As needed

## 2017-07-15 NOTE — Assessment & Plan Note (Signed)
Cont on O2 At bedtime   

## 2017-07-15 NOTE — Assessment & Plan Note (Signed)
Exacerbation  Plan  Patient Instructions  Augmentin 875mg  Twice daily  For 1 week , take with food  Mucinex DM Twice daily  As needed  Cough/congestion  Prednisone taper over next week.  Restart Ipratropium Neb Four times a day  .  Continue on Symbicort 2 puffs Twice daily  , rinse after use.  Please contact office for sooner follow up if symptoms do not improve or worsen or seek emergency care  Follow up with Dr. Halford Chessman in 3 months and As needed

## 2017-07-15 NOTE — Progress Notes (Signed)
77 y/o M followed for COPD, Sarcoidosis, chronic respiratory failure with hypoxemia on 2L [place] nocturnal oxygen.  Hx of CHF, HTN, GERD, OSA.  Presents today with complaints of cough, SOB at rest and with exertion over 2 months and has worsened in the last 2 weeks.  States he is coughing up thick clear mucus at times, the cough is ongoing day and night.  States he hasn't tried any OTC cough medications. No longer taking Atrovent Nebs.  Taking Symbicort as scheduled.  Endorses increasing fatigue, wheezing off and on, trace of edema to bilateral lower extremities, and sleeps with head elevated on pillows.  Continues to use 2L [place] at night.  Denies chest pain, palpitations or heart burn.  He is requesting a refill of his inhalers today. Last saw cardiology in November of 2018 for his CHF and was told this was under control.

## 2017-07-15 NOTE — Progress Notes (Signed)
@Patient  ID: Travis Ferguson, male    DOB: Aug 08, 1940, 77 y.o.   MRN: [id number]  Chief Complaint  Patient presents with  . Acute Visit    COPD    Referring provider: Crist Infante, MD  HPI: 77 year old male former smoker followed for COPD and sarcoidosis, chronic respiratory failure with hypoxemia on nocturnal oxygen  CHF hx   Sleep tests PSG May 2008>>AHI 8   Pulmonary tests Spirometry 11/18/08>>FEV1 2.15 (61%), FEV1% 62  Bronchoscopy 07/28/12 >> focal granulomatous inflammation Spirometry 03/14/14 >> FEV1 2.10 (60%), FEV1% 57  Cardiac tests Echo 08/19/14 >> EF 50 to 55%, grade 1 DD Echo 04/19/15 >> EF 30 to 35% Echo 10/27/15 >> EF 35 to 40%, grade 1 DD  Chest imaging CT chest 05/19/12>>superior segment LLL infiltrate, b/l hilar and mediastinal LAN with calcification CT chest 07/13/12 >> atherosclerosis, Lt hilar adenopathy, LLL superior segment narrowing, mass like opacity superior segment LLL 5.4 x 1.9 cm, multiple nodular areas LLL, diffuse GGO LLL PET scan 07/22/12 >> mildly hypermetabolic mediastinal adenopathy (SUV 5.8), superior segment nodules hypermetabolic (SUV 81.8) CT chest 08/31/12 >> decreased size of lesions CT chest 12/02/12 >> borderline mediastinal/hilar LAN, nodular septal thickening b/l, no change superior segment LLL, centrilobular emphysema CT chest 03/19/13 >> ?slight increase in LLL consolidative change, borderline LAN CT chest 08/24/13 >> 1.9 cm Rt paratracheal LAN, calcified 2.1 cm subcarinal LAN, narrowing of mainstem bronchi, chronic infiltrate LLL CT chest 03/16/14 >> atherosclerosis, no change in mediastinal and b/l hilar LAN, no change in LLL mass like opacity, thickening of interstitium, scattered sub-pleural nodules, small Lt pleural effusion, gallstones CT chest 08/19/14 >> small b/l effusions, emphysema, no change LLL HRCT chest 07/12/16 >> mild/mod centrilobular emphysema, interstitial thickening, confluent nodularity LLL stable, calcified  mediastinal and b/l hilar LAN  07/15/2017 Acute OV : COPD  Patient presents for an acute office visit.  He complains of increased cough congestion thick mucus wheezing and shortness of breath.  Patient says he coughs so hard at times that he feels that he could pass out.  He denies any hemoptysis orthopnea PND or leg swelling. He remains on Symbicort twice daily.  He is supposed to be using on ipratropium nebulizers 4 times daily.  However he says he has not been taking.  He does not like to do nebulizers on a consistent basis.     Allergies  Allergen Reactions  . Statins Other (See Comments)    Muscle aches - tolerating Vytorin    Immunization History  Administered Date(s) Administered  . Influenza Split 01/23/2011, 03/24/2013, 04/05/2014, 03/06/2015  . Influenza Whole 03/27/2009, 03/16/2010, 02/23/2012  . Influenza, High Dose Seasonal PF 02/19/2016  . Influenza-Unspecified 03/05/2017  . Pneumococcal Polysaccharide-23 04/27/2007, 10/22/2012    Past Medical History:  Diagnosis Date  . Acute on chronic combined systolic and diastolic CHF (congestive heart failure) ([place]) 02/24/2015  . Anxiety   . Arthritis    "about q joint I've got" (09/22/2015)  . Atherosclerosis   . Calculus of gallbladder with acute cholecystitis and obstruction   . CAP (community acquired pneumonia) 02/24/2015  . Cervical disc disease   . Chronic lower back pain    chronic back pain/under pain management  . Colon polyps    adenomatous and hyperplastic  . Colon polyps 08/02/2014   Tubular adenoma x 3, Hyperplastic-1  . COPD (chronic obstructive pulmonary disease) ([place])    a. Former Airline pilot, also smoked a pipe.  . Coronary artery calcification seen on CAT scan   .  Depression   . Diastolic dysfunction    grade 1 diastolic dysfunction on 2-D echo  . Dyslipidemia   . Dyspnea    chronic  . Encounter for biliary drainage tube placement    remains with drainage bag to right side for  biliary drainage.  .  Frequent urination   . Gallstones   . Gangrenous cholecystitis 08/17/2014  . GERD (gastroesophageal reflux disease)   . Hiatal hernia    sensation problem ("right lateral side hip to knee").  . History of blood transfusion "numerous"  . History of oxygen administration    @ 2 l/m nasally at bedtime.(09/22/2015)  . Hypertension   . Inguinal hernia    right side  . Left ventricular dysfunction    ejection fraction of 25-30% with regional wall motion abnormalities.  . Migraine    "years since I've had one" (09/22/2015)  . OSA (obstructive sleep apnea)    does not use CPAP; "just oxygen" (09/22/2015)  . Pneumonia ~ 1958  . PTSD (post-traumatic stress disorder)   . Rhinitis   . Sarcoidosis   . Sensation problem    right side-lateral hip to knee" decreased sensation and tingling feeling" -has informed Dr. Joylene Draft, Dr. Henrene Pastor, Dr. Lucia Gaskins of this.  . Sepsis ([place])   . Sinus congestion    10-27-14 at present some issues-"not bad"  . Steroid-induced hyperglycemia 02/25/2015  . Subdural hematoma ([place]) 11/10   a. 2010 s/p surgery - diagnosed several weeks after a fall.    Tobacco History: Social History   Tobacco Use  Smoking Status Former Smoker  . Packs/day: 0.00  . Years: 30.00  . Pack years: 0.00  . Types: Pipe  . Last attempt to quit: 06/24/2008  . Years since quitting: 9.0  Smokeless Tobacco Never Used   Counseling given: Not Answered   Outpatient Encounter Medications as of 07/15/2017  Medication Sig  . budesonide-formoterol (SYMBICORT) 160-4.5 MCG/ACT inhaler Inhale 2 puffs into the lungs 2 (two) times daily.  . DULoxetine (CYMBALTA) 30 MG capsule Take 30 mg by mouth daily.  [name] ENTRESTO 24-26 MG TAKE 1 TABLET BY MOUTH 2 (TWO) TIMES DAILY.  . finasteride (PROSCAR) 5 MG tablet Take 5 mg by mouth daily.   [name] FLUoxetine (PROZAC) 40 MG capsule Take 40 mg by mouth daily with breakfast.   . furosemide (LASIX) 20 MG tablet Take 1 tablet (20 mg total) by mouth daily.  [name]  HYDROcodone-acetaminophen (NORCO/VICODIN) 5-325 MG tablet Take 1 tablet by mouth every 6 (six) hours as needed for moderate pain.  . metoprolol tartrate (LOPRESSOR) 25 MG tablet TAKE 1 TABLET (25 MG TOTAL) BY MOUTH 2 (TWO) TIMES DAILY.  . Multiple Vitamin (MULTIVITAMIN) tablet Take 1 tablet by mouth daily.  . OXYGEN Inhale 2 L/min into the lungs at bedtime.  [name] PROAIR HFA 108 (90 BASE) MCG/ACT inhaler 1-2 PUFFS EVERY 4-6 HOURS AS NEEDED (Patient taking differently: 1-2 PUFFS EVERY 4-6 HOURS AS NEEDED for shortness of breath)  . simvastatin (ZOCOR) 20 MG tablet Take 20 mg by mouth every evening.   . sodium chloride (OCEAN) 0.65 % SOLN nasal spray Place 1-2 sprays into both nostrils daily as needed for congestion.  [name] spironolactone (ALDACTONE) 25 MG tablet Take 1 tablet (25 mg total) by mouth daily.  . SYMBICORT 160-4.5 MCG/ACT inhaler INHALE 2 PUFFS INTO THE LUNGS 2 (TWO) TIMES DAILY.  . tamsulosin (FLOMAX) 0.4 MG CAPS capsule Take 0.4 mg by mouth daily.  . [DISCONTINUED] budesonide-formoterol (SYMBICORT) 160-4.5 MCG/ACT inhaler Inhale 2  puffs into the lungs 2 (two) times daily.  [name] amoxicillin-clavulanate (AUGMENTIN) 875-125 MG tablet Take 1 tablet by mouth 2 (two) times daily for 7 days.  [name] ipratropium (ATROVENT) 0.02 % nebulizer solution Take 2.5 mLs (0.5 mg total) by nebulization 4 (four) times daily.  . predniSONE (DELTASONE) 10 MG tablet 4 tabs for 2 days, then 3 tabs for 2 days, 2 tabs for 2 days, then 1 tab for 2 days, then stop  . [DISCONTINUED] doxycycline (VIBRA-TABS) 100 MG tablet Take 1 tablet (100 mg total) by mouth 2 (two) times daily. (Patient not taking: Reported on 07/15/2017)  . [DISCONTINUED] ipratropium (ATROVENT) 0.02 % nebulizer solution Take 2.5 mLs (0.5 mg total) by nebulization 4 (four) times daily. (Patient not taking: Reported on 07/15/2017)  . [DISCONTINUED] predniSONE (DELTASONE) 10 MG tablet 4 tabs for 2 days, then 3 tabs for 2 days, 2 tabs for 2 days, then 1 tab for 2 days,  then stop (Patient not taking: Reported on 07/15/2017)  . [EXPIRED] levalbuterol (XOPENEX) nebulizer solution 0.63 mg    No facility-administered encounter medications on file as of 07/15/2017.      Review of Systems  Constitutional:   No  weight loss, night sweats,  Fevers, chills, fatigue, or  lassitude.  HEENT:   No headaches,  Difficulty swallowing,  Tooth/dental problems, or  Sore throat,                No sneezing, itching, ear ache,  +nasal congestion, post nasal drip,   CV:  No chest pain,  Orthopnea, PND, swelling in lower extremities, anasarca, dizziness, palpitations, syncope.   GI  No heartburn, indigestion, abdominal pain, nausea, vomiting, diarrhea, change in bowel habits, loss of appetite, bloody stools.   Resp:   No chest wall deformity  Skin: no rash or lesions.  GU: no dysuria, change in color of urine, no urgency or frequency.  No flank pain, no hematuria   MS chronic joint pain    Physical Exam  BP 108/64 (BP Location: Left Arm, Cuff Size: Normal)   Ht 5\' 11"  (1.803 m)   Wt 257 lb 3.2 oz (116.7 kg)   SpO2 94%   BMI 35.87 kg/m   GEN: A/Ox3; pleasant , NAD, elderly   HEENT:  [place]/AT,  EACs-clear, TMs-wnl, NOSE-clear, THROAT-clear, no lesions, no postnasal drip or exudate noted.   NECK:  Supple w/ fair ROM; no JVD; normal carotid impulses w/o bruits; no thyromegaly or nodules palpated; no lymphadenopathy.    RESP a few scattered rhonchi  no accessory muscle use, no dullness to percussion  CARD:  RRR, no m/r/g, no peripheral edema, pulses intact, no cyanosis or clubbing.  GI:   Soft & nt; nml bowel sounds; no organomegaly or masses detected.   Musco: Warm bil, no deformities or joint swelling noted.   Neuro: alert, no focal deficits noted.    Skin: Warm, no lesions or rashes    Lab Results:  CBC    BNP   Imaging: No results found.   Assessment & Plan:   COPD with acute exacerbation Exacerbation  Plan  Patient Instructions    Augmentin 875mg  Twice daily  For 1 week , take with food  Mucinex DM Twice daily  As needed  Cough/congestion  Prednisone taper over next week.  Restart Ipratropium Neb Four times a day  .  Continue on Symbicort 2 puffs Twice daily  , rinse after use.  Please contact office for sooner follow up if symptoms do not  improve or worsen or seek emergency care  Follow up with Dr. Halford Chessman in 3 months and As needed        Chronic respiratory failure ([place]) Cont on O2 At bedtime       Rexene Edison, NP 07/15/2017

## 2017-07-15 NOTE — Addendum Note (Signed)
Addended by: Rocky Morel D on: 07/15/2017 02:15 PM   Modules accepted: Orders

## 2017-08-08 ENCOUNTER — Ambulatory Visit (INDEPENDENT_AMBULATORY_CARE_PROVIDER_SITE_OTHER)
Admission: RE | Admit: 2017-08-08 | Discharge: 2017-08-08 | Disposition: A | Discharge status: Home / Self Care | Payer: Medicare Other | Source: Ambulatory Visit | Attending: Pulmonary Disease | Admitting: Pulmonary Disease

## 2017-08-08 ENCOUNTER — Other Ambulatory Visit (INDEPENDENT_AMBULATORY_CARE_PROVIDER_SITE_OTHER): Payer: Medicare Other

## 2017-08-08 ENCOUNTER — Telehealth: Payer: Self-pay | Admitting: Pulmonary Disease

## 2017-08-08 ENCOUNTER — Encounter: Payer: Self-pay | Admitting: Pulmonary Disease

## 2017-08-08 ENCOUNTER — Ambulatory Visit: Payer: Medicare Other | Admitting: Pulmonary Disease

## 2017-08-08 VITALS — BP 128/64 | HR 125 | Ht 71.0 in | Wt 263.0 lb

## 2017-08-08 DIAGNOSIS — R0602 Shortness of breath: Secondary | ICD-10-CM

## 2017-08-08 DIAGNOSIS — J441 Chronic obstructive pulmonary disease with (acute) exacerbation: Secondary | ICD-10-CM | POA: Diagnosis not present

## 2017-08-08 DIAGNOSIS — J449 Chronic obstructive pulmonary disease, unspecified: Secondary | ICD-10-CM

## 2017-08-08 DIAGNOSIS — J9611 Chronic respiratory failure with hypoxia: Secondary | ICD-10-CM

## 2017-08-08 DIAGNOSIS — D860 Sarcoidosis of lung: Secondary | ICD-10-CM

## 2017-08-08 LAB — COMPREHENSIVE METABOLIC PANEL
ALBUMIN: 3.6 g/dL (ref 3.5–5.2)
ALT: 8 U/L (ref 0–53)
AST: 9 U/L (ref 0–37)
Alkaline Phosphatase: 64 U/L (ref 39–117)
BUN: 16 mg/dL (ref 6–23)
CHLORIDE: 99 meq/L (ref 96–112)
CO2: 26 mEq/L (ref 19–32)
Calcium: 9.1 mg/dL (ref 8.4–10.5)
Creatinine, Ser: 0.98 mg/dL (ref 0.40–1.50)
GFR: 78.83 mL/min (ref >60.00)
Glucose, Bld: 176 mg/dL — ABNORMAL HIGH (ref 70–99)
POTASSIUM: 4.1 meq/L (ref 3.5–5.1)
Sodium: 135 mEq/L (ref 135–145)
Total Bilirubin: 1.1 mg/dL (ref 0.2–1.2)
Total Protein: 6.9 g/dL (ref 6.0–8.3)

## 2017-08-08 LAB — CBC WITH DIFFERENTIAL/PLATELET
BASOS PCT: 0.1 % (ref 0.0–3.0)
Basophils Absolute: 0 10*3/uL (ref 0.0–0.1)
EOS ABS: 0.3 10*3/uL (ref 0.0–0.7)
EOS PCT: 1.9 % (ref 0.0–5.0)
HEMATOCRIT: 39.2 % (ref 39.0–52.0)
HEMOGLOBIN: 13.6 g/dL (ref 13.0–17.0)
LYMPHS PCT: 9 % — AB (ref 12.0–46.0)
Lymphs Abs: 1.2 10*3/uL (ref 0.7–4.0)
MCHC: 34.6 g/dL (ref 30.0–36.0)
MCV: 82.5 fl (ref 78.0–100.0)
MONOS PCT: 6.3 % (ref 3.0–12.0)
Monocytes Absolute: 0.9 10*3/uL (ref 0.1–1.0)
Neutro Abs: 11.3 10*3/uL — ABNORMAL HIGH (ref 1.4–7.7)
Neutrophils Relative %: 82.7 % — ABNORMAL HIGH (ref 43.0–77.0)
Platelets: 347 10*3/uL (ref 150.0–400.0)
RBC: 4.75 Mil/uL (ref 4.22–5.81)
RDW: 13.7 % (ref 11.5–15.5)
WBC: 13.7 10*3/uL — AB (ref 4.0–10.5)

## 2017-08-08 LAB — SEDIMENTATION RATE: Sed Rate: 53 mm/hr — ABNORMAL HIGH (ref 0–20)

## 2017-08-08 LAB — BRAIN NATRIURETIC PEPTIDE: Pro B Natriuretic peptide (BNP): 92 pg/mL (ref 0.0–100.0)

## 2017-08-08 MED ORDER — ALBUTEROL SULFATE HFA 108 (90 BASE) MCG/ACT IN AERS: INHALATION_SPRAY | RESPIRATORY_TRACT | 1 refills | Status: DC

## 2017-08-08 MED ORDER — BUDESONIDE-FORMOTEROL FUMARATE 160-4.5 MCG/ACT IN AERO: 2.0000 | INHALATION_SPRAY | Freq: Two times a day (BID) | RESPIRATORY_TRACT | 0 refills | Status: DC

## 2017-08-08 MED ORDER — PREDNISONE 10 MG PO TABS: ORAL_TABLET | ORAL | 0 refills | Status: DC

## 2017-08-08 NOTE — Telephone Encounter (Signed)
Order has been sent to [place] looks like that is who they used last

## 2017-08-08 NOTE — Progress Notes (Signed)
[facility], [facility], and Sleep [facility]  Chief Complaint  Patient presents with  . Acute Visit    Pt having hard time breathing, chest pain and tightness, SOB at rest and w/exertion. Pt has swelling in legs and hands, and pt states when he bends over cannot breath.    Vital signs: BP 128/64 (BP Location: Left Arm, Cuff Size: Normal)   Pulse (!) 125   Ht 5\' 11"  (1.803 m)   Wt 263 lb (119.3 kg)   SpO2 90%   BMI 36.68 kg/m   History of Present Illness: Travis Ferguson is a 77 y.o. male former smoker with COPD, and sarcoidosis.  His breathing has been much worse over the past several weeks.  He was seen by Rexene Edison and given course of Abx and prednisone.  Had marginal improvement.  His wife reports that he drinks 6 to 10 bottles of soda a day (12 ounces).  He also eats cookies all day.  His feels short of breath if he bends over or lays down.  He is going to the bathroom frequently.  He has gained about 4 to 5 lbs.  He is not have sputum, fever, or hemoptysis.  He is using oxygen at night.  He does not want to use oxygen during the day.  He can't walk more than 50 feet before getting short of breath.  Physical Exam:  General - pausing for breath between sentences Eyes - pupils reactive ENT - no sinus tenderness, no oral exudate, no LAN Cardiac - regular, no murmur Chest - decreased breath sounds, no rales or dullness Abd - soft, non tender Ext - no edema Skin - no rashes Neuro - normal strength Psych - normal mood   CMP Latest Ref Rng & Units 08/08/2017 09/23/2015 09/14/2015  Glucose 70 - 99 mg/dL 176(H) 108(H) 113(H)  BUN 6 - 23 mg/dL 16 10 20   Creatinine 0.40 - 1.50 mg/dL 0.98 0.85 0.96  Sodium 135 - 145 mEq/L 135 136 138  Potassium 3.5 - 5.1 mEq/L 4.1 4.2 4.2  Chloride 96 - 112 mEq/L 99 106 108  CO2 19 - 32 mEq/L 26 24 21(L)  Calcium 8.4 - 10.5 mg/dL 9.1 8.7(L) 9.1  Total Protein 6.0 - 8.3 g/dL 6.9 - -  Total Bilirubin 0.2 - 1.2 mg/dL 1.1 - -  Alkaline Phos  39 - 117 U/L 64 - -  AST 0 - 37 U/L 9 - -  ALT 0 - 53 U/L 8 - -    CBC Latest Ref Rng & Units 08/08/2017 09/23/2015 09/14/2015  WBC 4.0 - 10.5 K/uL 13.7(H) - 9.7  Hemoglobin 13.0 - 17.0 g/dL 13.6 11.9(L) 14.0  Hematocrit 39.0 - 52.0 % 39.2 35.6(L) 42.5  Platelets 150.0 - 400.0 K/uL 347.0 - 252    Lab Results  Component Value Date   ESRSEDRATE 53 (H) 08/08/2017    ProBNP (last 3 results) Recent Labs    08/08/17 0958  PROBNP 92.0    Dg Chest 2 View  Result Date: 08/08/2017 CLINICAL DATA:  Shortness of breath with cough and congestion EXAM: CHEST  2 VIEW COMPARISON:  Chest radiograph July 05, 2016 and chest CT July 12, 2016 FINDINGS: There is patchy fibrosis in the lung bases. There is no edema or consolidation. Heart is upper normal in size with pulmonary vascularity within normal limits. No adenopathy. There is aortic atherosclerosis. There is degenerative change in the thoracic spine. There is a total shoulder replacement on the left. IMPRESSION: Patchy  bibasilar fibrosis. No edema or consolidation. Heart size normal. There is aortic atherosclerosis. Aortic Atherosclerosis (ICD10-I70.0). Electronically Signed   By: Lowella Grip III M.D.   On: 08/08/2017 10:03    Discussion: He has progressive symptoms of dyspnea.  His CXR did not show edema and BNP low.  Therefore, don't think this is related to heart failure.  I think this is most likely related to his COPD, and might also have component of sarcoidosis flare.  Assessment/Plan:  COPD with chronic bronchitis. - add prednisone and keep on 10 mg daily until next visit - continue symbicort >> might need to transition to nebulized budesonide and brovana if he is still having trouble with his breathing - continue albuterol prn - I don't think he has bacterial infection that would necessitate antibiotics at this time  Chronic respiratory failure with hypoxia. - Related to COPD, pulmonary sarcoidosis, presumed OSA - continue  2 liters oxygen at night - advised he could try using during the day as needed when his breathing gets worse and his pulse ox reading is below 90%  Pulmonary sarcoidosis. - will give trial of prednisone - depending on response will determine if he needs repeat CT chest  Combined heart failure. - f/u with cardiology   Patient Instructions  Prednisone 10 mg pills >> 3 pills daily for 3 days, 2 pills daily for 3 days, then 1 pill daily until next visit  Follow up 3 weeks with Dr. Halford Ferguson or Nurse Practitioner   Chesley Mires, MD [place] 08/08/2017, 11:55 AM Pager:  [phone]  Flow Sheet  Sleep tests: PSG May 2008>> AHI 8   Pulmonary tests Spirometry 11/18/08>>FEV1 2.15 (61%), FEV1% 62  Bronchoscopy 07/28/12 >> focal granulomatous inflammation Spirometry 03/14/14 >> FEV1 2.10 (60%), FEV1% 57  Cardiac tests Echo 08/19/14 >> EF 50 to 55%, grade 1 DD Echo 04/19/15 >> EF 30 to 35% Echo 10/27/15 >> EF 35 to 40%, grade 1 DD  Chest imaging CT chest 05/19/12>>superior segment LLL infiltrate, b/l hilar and mediastinal LAN with calcification CT chest 07/13/12 >> atherosclerosis, Lt hilar adenopathy, LLL superior segment narrowing, mass like opacity superior segment LLL 5.4 x 1.9 cm, multiple nodular areas LLL, diffuse GGO LLL PET scan 07/22/12 >> mildly hypermetabolic mediastinal adenopathy (SUV 5.8), superior segment nodules hypermetabolic (SUV 25.3) CT chest 08/31/12 >> decreased size of lesions CT chest 12/02/12 >> borderline mediastinal/hilar LAN, nodular septal thickening b/l, no change superior segment LLL, centrilobular emphysema CT chest 03/19/13 >> ?slight increase in LLL consolidative change, borderline LAN CT chest 08/24/13 >> 1.9 cm Rt paratracheal LAN, calcified 2.1 cm subcarinal LAN, narrowing of mainstem bronchi, chronic infiltrate LLL CT chest 03/16/14 >> atherosclerosis, no change in mediastinal and b/l hilar LAN, no change in LLL mass like opacity,  thickening of interstitium, scattered sub-pleural nodules, small Lt pleural effusion, gallstones CT chest 08/19/14 >> small b/l effusions, emphysema, no change LLL HRCT chest 07/12/16 >> mild/mod centrilobular emphysema, interstitial thickening, confluent nodularity LLL stable, calcified mediastinal and b/l hilar LAN  Past Medical History: He  has a past medical history of Acute on chronic combined systolic and diastolic CHF (congestive heart failure) ([place]) (02/24/2015), Anxiety, Arthritis, Atherosclerosis, Calculus of gallbladder with acute cholecystitis and obstruction, CAP (community acquired pneumonia) (02/24/2015), Cervical disc disease, Chronic lower back pain, Colon polyps, Colon polyps (08/02/2014), COPD (chronic obstructive pulmonary disease) ([place]), Coronary artery calcification seen on CAT scan, Depression, Diastolic dysfunction, Dyslipidemia, Dyspnea, Encounter for biliary drainage tube placement, Frequent urination, Gallstones, Gangrenous cholecystitis (08/17/2014),  GERD (gastroesophageal reflux disease), Hiatal hernia, History of blood transfusion ("numerous"), History of oxygen administration, Hypertension, Inguinal hernia, Left ventricular dysfunction, Migraine, OSA (obstructive sleep apnea), Pneumonia (~ 1958), PTSD (post-traumatic stress disorder), Rhinitis, Sarcoidosis, Sensation problem, Sepsis ([place]), Sinus congestion, Steroid-induced hyperglycemia (02/25/2015), and Subdural hematoma ([place]) (11/10).  Past Surgical History: He  has a past surgical history that includes Video bronchoscopy (07/28/2012); Knee arthroscopy (Right); Lumbar laminectomy/decompression microdiscectomy (Right, 12/15/2012); Gallbladder surgery (09/2014); ERCP (N/A, 11/07/2014); Cholecystectomy (N/A, 11/08/2014); Cardiac catheterization (N/A, 01/23/2015); Reverse total shoulder arthroplasty (Left, 09/22/2015); Inguinal hernia repair (Left); Back surgery; Brain surgery (04/2009); and Reverse shoulder arthroplasty (Left,  09/22/2015).  Family History: His family history includes Colon polyps in his brother; Congestive Heart Failure in his brother; Coronary artery disease in his unknown relative; Heart disease in his father and mother; Stomach cancer in his paternal grandmother.  Social History: He  reports that he quit smoking about 9 years ago. His smoking use included pipe. He smoked 0.00 packs per day for 30.00 years. he has never used smokeless tobacco. He reports that he does not drink alcohol or use drugs.  Medications: Allergies as of 08/08/2017      Reactions   Statins Other (See Comments)   Muscle aches - tolerating Vytorin      Medication List        Accurate as of 08/08/17 11:55 AM. Always use your most recent med list.          albuterol 108 (90 Base) MCG/ACT inhaler Commonly known as:  PROAIR HFA 1-2 PUFFS EVERY 4-6 HOURS AS NEEDED for shortness of breath   DULoxetine 30 MG capsule Commonly known as:  CYMBALTA Take 30 mg by mouth daily.   ENTRESTO 24-26 MG Generic drug:  sacubitril-valsartan TAKE 1 TABLET BY MOUTH 2 (TWO) TIMES DAILY.   finasteride 5 MG tablet Commonly known as:  PROSCAR Take 5 mg by mouth daily.   FLUoxetine 40 MG capsule Commonly known as:  PROZAC Take 40 mg by mouth daily with breakfast.   furosemide 20 MG tablet Commonly known as:  LASIX Take 1 tablet (20 mg total) by mouth daily.   HYDROcodone-acetaminophen 5-325 MG tablet Commonly known as:  NORCO/VICODIN Take 1 tablet by mouth every 6 (six) hours as needed for moderate pain.   ipratropium 0.02 % nebulizer solution Commonly known as:  ATROVENT Take 2.5 mLs (0.5 mg total) by nebulization 4 (four) times daily.   metoprolol tartrate 25 MG tablet Commonly known as:  LOPRESSOR TAKE 1 TABLET (25 MG TOTAL) BY MOUTH 2 (TWO) TIMES DAILY.   multivitamin tablet Take 1 tablet by mouth daily.   OXYGEN Inhale 2 L/min into the lungs at bedtime.   predniSONE 10 MG tablet Commonly known as:   DELTASONE 3 pills daily for 3 days, 2 pills daily for 3 days, then 1 pill daily   simvastatin 20 MG tablet Commonly known as:  ZOCOR Take 20 mg by mouth every evening.   sodium chloride 0.65 % Soln nasal spray Commonly known as:  OCEAN Place 1-2 sprays into both nostrils daily as needed for congestion.   spironolactone 25 MG tablet Commonly known as:  ALDACTONE Take 1 tablet (25 mg total) by mouth daily.   SYMBICORT 160-4.5 MCG/ACT inhaler Generic drug:  budesonide-formoterol INHALE 2 PUFFS INTO THE LUNGS 2 (TWO) TIMES DAILY.   budesonide-formoterol 160-4.5 MCG/ACT inhaler Commonly known as:  SYMBICORT Inhale 2 puffs into the lungs 2 (two) times daily.   budesonide-formoterol 160-4.5 MCG/ACT inhaler Commonly known  as:  SYMBICORT Inhale 2 puffs into the lungs 2 (two) times daily.   tamsulosin 0.4 MG Caps capsule Commonly known as:  FLOMAX Take 0.4 mg by mouth daily.

## 2017-08-08 NOTE — Patient Instructions (Signed)
Prednisone 10 mg pills >> 3 pills daily for 3 days, 2 pills daily for 3 days, then 1 pill daily until next visit  Follow up 3 weeks with Dr. Halford Chessman or Nurse Practitioner

## 2017-08-08 NOTE — Addendum Note (Signed)
Addended by: Georjean Mode on: 08/08/2017 05:26 PM   Modules accepted: Orders

## 2017-08-11 ENCOUNTER — Telehealth: Payer: Self-pay | Admitting: Pulmonary Disease

## 2017-08-11 NOTE — Telephone Encounter (Signed)
Called patient, unable to reach left message to give us a call back. 

## 2017-08-12 NOTE — Telephone Encounter (Signed)
Advised pt of results. Pt understood and nothing further is needed.   

## 2017-08-12 NOTE — Telephone Encounter (Signed)
Spoke with pt, requesting lab results.  States he saw his lab results on MyChart and notes that he ate an oatmeal cookie prior to his lab draw, and wonders if this could cause his high blood glucose levels.  VS please advise.  Thanks!

## 2017-08-12 NOTE — Telephone Encounter (Signed)
Please let him know that his lab tests were consistent with inflammation in his body likely from COPD exacerbation and/or sarcoidosis.  He should continue taking prednisone.

## 2017-09-01 ENCOUNTER — Encounter: Payer: Self-pay | Admitting: Pulmonary Disease

## 2017-09-01 ENCOUNTER — Ambulatory Visit: Payer: Medicare Other | Admitting: Pulmonary Disease

## 2017-09-01 VITALS — BP 122/80 | HR 108 | Ht 71.0 in | Wt 258.2 lb

## 2017-09-01 DIAGNOSIS — R05 Cough: Secondary | ICD-10-CM

## 2017-09-01 DIAGNOSIS — J449 Chronic obstructive pulmonary disease, unspecified: Secondary | ICD-10-CM | POA: Diagnosis not present

## 2017-09-01 DIAGNOSIS — R058 Other specified cough: Secondary | ICD-10-CM

## 2017-09-01 DIAGNOSIS — J441 Chronic obstructive pulmonary disease with (acute) exacerbation: Secondary | ICD-10-CM

## 2017-09-01 DIAGNOSIS — J9611 Chronic respiratory failure with hypoxia: Secondary | ICD-10-CM | POA: Diagnosis not present

## 2017-09-01 DIAGNOSIS — R29818 Other symptoms and signs involving the nervous system: Secondary | ICD-10-CM

## 2017-09-01 DIAGNOSIS — D860 Sarcoidosis of lung: Secondary | ICD-10-CM | POA: Diagnosis not present

## 2017-09-01 MED ORDER — BUDESONIDE-FORMOTEROL FUMARATE 160-4.5 MCG/ACT IN AERO: 2.0000 | INHALATION_SPRAY | Freq: Two times a day (BID) | RESPIRATORY_TRACT | 0 refills | Status: DC

## 2017-09-01 NOTE — Patient Instructions (Signed)
Will schedule home sleep study  Change prednisone to 5 mg daily for two weeks, and then stop prednisone  Follow up in 6 weeks

## 2017-09-01 NOTE — Progress Notes (Signed)
Pulmonary, Critical Care, and Sleep Medicine  Chief Complaint  Patient presents with  . Follow-up    Pt still having SOB at rest and with exertion, wheezing, and some chest tightness. Pt feels he is sufficating at night.Pt having stomach pain and having some blood in urine in last week and half.    Vital signs: BP 122/80 (BP Location: Left Arm, Cuff Size: Normal)   Pulse (!) 108   Ht 5\' 11"  (1.803 m)   Wt 258 lb 3.2 oz (117.1 kg)   SpO2 92%   BMI 36.01 kg/m   History of Present Illness: Travis Ferguson is a 77 y.o. male former smoker with COPD, and sarcoidosis.  He is having more trouble with his sleep.  He feels like he smothers at times.  He is using oxygen at night, but not helping as much.  He gets winded easily with activity, but then recovers after resting.  He hasn't been very active recently.  He isn't having as much cough, wheeze, or sputum.  He feels prednisone has helped, but doesn't feel like he needs this now.  He would like to try cutting down the dose.   Physical Exam:  General - pleasant Eyes - pupils reactive ENT - no sinus tenderness, no oral exudate, no LAN, raspy voice Cardiac - regular, no murmur Chest - no wheeze, rales Abd - soft, non tender Ext - no edema Skin - no rashes Neuro - normal strength Psych - normal mood  Discussion: Respiratory symptoms have improved after recent course of prednisone.  I think his symptoms are from COPD.  Less concerned about sarcoid.  Cardiac status seems stable.  He also has upper airway irritation.  He has noticed more snoring, sleep disruption, and waking up feeling like he can't breath.  I am concerned his sleep apnea has progressed and he is now willing to try CPAP if he needs to.    Assessment/Plan:  COPD with chronic bronchitis. - change prednisone to 5 mg daily for two weeks, and then stop prednisone - continue symbicort and prn albuterol  Chronic respiratory failure with hypoxia. - Related to  COPD, pulmonary sarcoidosis, presumed OSA - maintained SpO2 on room air with exertion today - continue 2 liters oxygen at night for now and reassess after review of his sleep study  Pulmonary sarcoidosis. - monitor clinically - defer CT chest/PFT for now  Suspected sleep apnea. - will arrange for home sleep study to further assess    Patient Instructions  Will schedule home sleep study  Change prednisone to 5 mg daily for two weeks, and then stop prednisone  Follow up in 6 weeks   Chesley Mires, MD [place] 09/01/2017, 1:01 PM Pager:  [phone]  Flow Sheet  Sleep tests: PSG May 2008>> AHI 8   Pulmonary tests Spirometry 11/18/08>>FEV1 2.15 (61%), FEV1% 62  Bronchoscopy 07/28/12 >> focal granulomatous inflammation Spirometry 03/14/14 >> FEV1 2.10 (60%), FEV1% 57  Cardiac tests Echo 08/19/14 >> EF 50 to 55%, grade 1 DD Echo 04/19/15 >> EF 30 to 35% Echo 10/27/15 >> EF 35 to 40%, grade 1 DD  Chest imaging CT chest 05/19/12>>superior segment LLL infiltrate, b/l hilar and mediastinal LAN with calcification CT chest 07/13/12 >> atherosclerosis, Lt hilar adenopathy, LLL superior segment narrowing, mass like opacity superior segment LLL 5.4 x 1.9 cm, multiple nodular areas LLL, diffuse GGO LLL PET scan 07/22/12 >> mildly hypermetabolic mediastinal adenopathy (SUV 5.8), superior segment nodules hypermetabolic (SUV 25.0) CT chest 08/31/12 >>  decreased size of lesions CT chest 12/02/12 >> borderline mediastinal/hilar LAN, nodular septal thickening b/l, no change superior segment LLL, centrilobular emphysema CT chest 03/19/13 >> ?slight increase in LLL consolidative change, borderline LAN CT chest 08/24/13 >> 1.9 cm Rt paratracheal LAN, calcified 2.1 cm subcarinal LAN, narrowing of mainstem bronchi, chronic infiltrate LLL CT chest 03/16/14 >> atherosclerosis, no change in mediastinal and b/l hilar LAN, no change in LLL mass like opacity, thickening of interstitium,  scattered sub-pleural nodules, small Lt pleural effusion, gallstones CT chest 08/19/14 >> small b/l effusions, emphysema, no change LLL HRCT chest 07/12/16 >> mild/mod centrilobular emphysema, interstitial thickening, confluent nodularity LLL stable, calcified mediastinal and b/l hilar LAN  Past Medical History: He  has a past medical history of Acute on chronic combined systolic and diastolic CHF (congestive heart failure) ([place]) (02/24/2015), Anxiety, Arthritis, Atherosclerosis, Calculus of gallbladder with acute cholecystitis and obstruction, CAP (community acquired pneumonia) (02/24/2015), Cervical disc disease, Chronic lower back pain, Colon polyps, Colon polyps (08/02/2014), COPD (chronic obstructive pulmonary disease) ([place]), Coronary artery calcification seen on CAT scan, Depression, Diastolic dysfunction, Dyslipidemia, Dyspnea, Encounter for biliary drainage tube placement, Frequent urination, Gallstones, Gangrenous cholecystitis (08/17/2014), GERD (gastroesophageal reflux disease), Hiatal hernia, History of blood transfusion ("numerous"), History of oxygen administration, Hypertension, Inguinal hernia, Left ventricular dysfunction, Migraine, OSA (obstructive sleep apnea), Pneumonia (~ 1958), PTSD (post-traumatic stress disorder), Rhinitis, Sarcoidosis, Sensation problem, Sepsis ([place]), Sinus congestion, Steroid-induced hyperglycemia (02/25/2015), and Subdural hematoma ([place]) (11/10).  Past Surgical History: He  has a past surgical history that includes Video bronchoscopy (07/28/2012); Knee arthroscopy (Right); Lumbar laminectomy/decompression microdiscectomy (Right, 12/15/2012); Gallbladder surgery (09/2014); ERCP (N/A, 11/07/2014); Cholecystectomy (N/A, 11/08/2014); Cardiac catheterization (N/A, 01/23/2015); Reverse total shoulder arthroplasty (Left, 09/22/2015); Inguinal hernia repair (Left); Back surgery; Brain surgery (04/2009); and Reverse shoulder arthroplasty (Left, 09/22/2015).  Family History: His family  history includes Colon polyps in his brother; Congestive Heart Failure in his brother; Coronary artery disease in his unknown relative; Heart disease in his father and mother; Stomach cancer in his paternal grandmother.  Social History: He  reports that he quit smoking about 9 years ago. His smoking use included pipe. He smoked 0.00 packs per day for 30.00 years. he has never used smokeless tobacco. He reports that he does not drink alcohol or use drugs.  Medications: Allergies as of 09/01/2017      Reactions   Statins Other (See Comments)   Muscle aches - tolerating Vytorin      Medication List        Accurate as of 09/01/17  1:01 PM. Always use your most recent med list.          albuterol 108 (90 Base) MCG/ACT inhaler Commonly known as:  PROAIR HFA 1-2 PUFFS EVERY 4-6 HOURS AS NEEDED for shortness of breath   DULoxetine 30 MG capsule Commonly known as:  CYMBALTA Take 30 mg by mouth daily.   ENTRESTO 24-26 MG Generic drug:  sacubitril-valsartan TAKE 1 TABLET BY MOUTH 2 (TWO) TIMES DAILY.   finasteride 5 MG tablet Commonly known as:  PROSCAR Take 5 mg by mouth daily.   FLUoxetine 40 MG capsule Commonly known as:  PROZAC Take 40 mg by mouth daily with breakfast.   furosemide 20 MG tablet Commonly known as:  LASIX Take 1 tablet (20 mg total) by mouth daily.   HYDROcodone-acetaminophen 5-325 MG tablet Commonly known as:  NORCO/VICODIN Take 1 tablet by mouth every 6 (six) hours as needed for moderate pain.   ipratropium 0.02 % nebulizer solution Commonly  known as:  ATROVENT Take 2.5 mLs (0.5 mg total) by nebulization 4 (four) times daily.   metoprolol tartrate 25 MG tablet Commonly known as:  LOPRESSOR TAKE 1 TABLET (25 MG TOTAL) BY MOUTH 2 (TWO) TIMES DAILY.   multivitamin tablet Take 1 tablet by mouth daily.   OXYGEN Inhale 2 L/min into the lungs at bedtime.   predniSONE 10 MG tablet Commonly known as:  DELTASONE 3 pills daily for 3 days, 2 pills daily for  3 days, then 1 pill daily   simvastatin 20 MG tablet Commonly known as:  ZOCOR Take 20 mg by mouth every evening.   sodium chloride 0.65 % Soln nasal spray Commonly known as:  OCEAN Place 1-2 sprays into both nostrils daily as needed for congestion.   spironolactone 25 MG tablet Commonly known as:  ALDACTONE Take 1 tablet (25 mg total) by mouth daily.   SYMBICORT 160-4.5 MCG/ACT inhaler Generic drug:  budesonide-formoterol INHALE 2 PUFFS INTO THE LUNGS 2 (TWO) TIMES DAILY.   budesonide-formoterol 160-4.5 MCG/ACT inhaler Commonly known as:  SYMBICORT Inhale 2 puffs into the lungs 2 (two) times daily.   budesonide-formoterol 160-4.5 MCG/ACT inhaler Commonly known as:  SYMBICORT Inhale 2 puffs into the lungs 2 (two) times daily.   budesonide-formoterol 160-4.5 MCG/ACT inhaler Commonly known as:  SYMBICORT Inhale 2 puffs into the lungs 2 (two) times daily.

## 2017-09-02 ENCOUNTER — Other Ambulatory Visit: Payer: Self-pay

## 2017-09-02 ENCOUNTER — Inpatient Hospital Stay (HOSPITAL_COMMUNITY)
Admission: EM | Admit: 2017-09-02 | Discharge: 2017-09-08 | DRG: 871 | Disposition: A | Discharge status: Home / Self Care | Payer: Medicare Other | Attending: Internal Medicine | Admitting: Internal Medicine

## 2017-09-02 ENCOUNTER — Emergency Department (HOSPITAL_COMMUNITY): Payer: Medicare Other

## 2017-09-02 ENCOUNTER — Encounter (HOSPITAL_COMMUNITY): Payer: Self-pay

## 2017-09-02 DIAGNOSIS — I5042 Chronic combined systolic (congestive) and diastolic (congestive) heart failure: Secondary | ICD-10-CM | POA: Diagnosis not present

## 2017-09-02 DIAGNOSIS — K449 Diaphragmatic hernia without obstruction or gangrene: Secondary | ICD-10-CM | POA: Diagnosis present

## 2017-09-02 DIAGNOSIS — M1990 Unspecified osteoarthritis, unspecified site: Secondary | ICD-10-CM | POA: Diagnosis present

## 2017-09-02 DIAGNOSIS — I110 Hypertensive heart disease with heart failure: Secondary | ICD-10-CM | POA: Diagnosis present

## 2017-09-02 DIAGNOSIS — I5043 Acute on chronic combined systolic (congestive) and diastolic (congestive) heart failure: Secondary | ICD-10-CM | POA: Diagnosis present

## 2017-09-02 DIAGNOSIS — I428 Other cardiomyopathies: Secondary | ICD-10-CM | POA: Diagnosis not present

## 2017-09-02 DIAGNOSIS — J90 Pleural effusion, not elsewhere classified: Secondary | ICD-10-CM

## 2017-09-02 DIAGNOSIS — Y95 Nosocomial condition: Secondary | ICD-10-CM | POA: Diagnosis present

## 2017-09-02 DIAGNOSIS — M545 Low back pain: Secondary | ICD-10-CM | POA: Diagnosis present

## 2017-09-02 DIAGNOSIS — I4892 Unspecified atrial flutter: Secondary | ICD-10-CM | POA: Diagnosis present

## 2017-09-02 DIAGNOSIS — N390 Urinary tract infection, site not specified: Secondary | ICD-10-CM | POA: Diagnosis present

## 2017-09-02 DIAGNOSIS — A419 Sepsis, unspecified organism: Principal | ICD-10-CM | POA: Diagnosis present

## 2017-09-02 DIAGNOSIS — J9621 Acute and chronic respiratory failure with hypoxia: Secondary | ICD-10-CM | POA: Diagnosis not present

## 2017-09-02 DIAGNOSIS — J449 Chronic obstructive pulmonary disease, unspecified: Secondary | ICD-10-CM | POA: Diagnosis present

## 2017-09-02 DIAGNOSIS — Z9049 Acquired absence of other specified parts of digestive tract: Secondary | ICD-10-CM

## 2017-09-02 DIAGNOSIS — J440 Chronic obstructive pulmonary disease with acute lower respiratory infection: Secondary | ICD-10-CM | POA: Diagnosis present

## 2017-09-02 DIAGNOSIS — F329 Major depressive disorder, single episode, unspecified: Secondary | ICD-10-CM | POA: Diagnosis present

## 2017-09-02 DIAGNOSIS — Z87891 Personal history of nicotine dependence: Secondary | ICD-10-CM

## 2017-09-02 DIAGNOSIS — J9611 Chronic respiratory failure with hypoxia: Secondary | ICD-10-CM | POA: Diagnosis not present

## 2017-09-02 DIAGNOSIS — Z825 Family history of asthma and other chronic lower respiratory diseases: Secondary | ICD-10-CM

## 2017-09-02 DIAGNOSIS — J189 Pneumonia, unspecified organism: Secondary | ICD-10-CM | POA: Diagnosis present

## 2017-09-02 DIAGNOSIS — I10 Essential (primary) hypertension: Secondary | ICD-10-CM | POA: Diagnosis not present

## 2017-09-02 DIAGNOSIS — G4733 Obstructive sleep apnea (adult) (pediatric): Secondary | ICD-10-CM | POA: Diagnosis present

## 2017-09-02 DIAGNOSIS — E785 Hyperlipidemia, unspecified: Secondary | ICD-10-CM

## 2017-09-02 DIAGNOSIS — K219 Gastro-esophageal reflux disease without esophagitis: Secondary | ICD-10-CM | POA: Diagnosis present

## 2017-09-02 DIAGNOSIS — I471 Supraventricular tachycardia: Secondary | ICD-10-CM | POA: Diagnosis present

## 2017-09-02 DIAGNOSIS — Z9981 Dependence on supplemental oxygen: Secondary | ICD-10-CM | POA: Diagnosis not present

## 2017-09-02 DIAGNOSIS — F4310 Post-traumatic stress disorder, unspecified: Secondary | ICD-10-CM | POA: Diagnosis present

## 2017-09-02 DIAGNOSIS — Z888 Allergy status to other drugs, medicaments and biological substances status: Secondary | ICD-10-CM

## 2017-09-02 DIAGNOSIS — Z86010 Personal history of colonic polyps: Secondary | ICD-10-CM

## 2017-09-02 DIAGNOSIS — D860 Sarcoidosis of lung: Secondary | ICD-10-CM | POA: Diagnosis not present

## 2017-09-02 DIAGNOSIS — R000 Tachycardia, unspecified: Secondary | ICD-10-CM

## 2017-09-02 DIAGNOSIS — I483 Typical atrial flutter: Secondary | ICD-10-CM

## 2017-09-02 DIAGNOSIS — G894 Chronic pain syndrome: Secondary | ICD-10-CM | POA: Diagnosis not present

## 2017-09-02 DIAGNOSIS — R0602 Shortness of breath: Secondary | ICD-10-CM

## 2017-09-02 DIAGNOSIS — Z96612 Presence of left artificial shoulder joint: Secondary | ICD-10-CM | POA: Diagnosis present

## 2017-09-02 DIAGNOSIS — E7800 Pure hypercholesterolemia, unspecified: Secondary | ICD-10-CM | POA: Diagnosis not present

## 2017-09-02 DIAGNOSIS — I959 Hypotension, unspecified: Secondary | ICD-10-CM | POA: Diagnosis present

## 2017-09-02 DIAGNOSIS — Z8371 Family history of colonic polyps: Secondary | ICD-10-CM

## 2017-09-02 DIAGNOSIS — J181 Lobar pneumonia, unspecified organism: Secondary | ICD-10-CM | POA: Diagnosis present

## 2017-09-02 DIAGNOSIS — Z8249 Family history of ischemic heart disease and other diseases of the circulatory system: Secondary | ICD-10-CM

## 2017-09-02 DIAGNOSIS — Z7901 Long term (current) use of anticoagulants: Secondary | ICD-10-CM

## 2017-09-02 DIAGNOSIS — Z800 Family history of malignant neoplasm of digestive organs: Secondary | ICD-10-CM

## 2017-09-02 DIAGNOSIS — I4891 Unspecified atrial fibrillation: Secondary | ICD-10-CM | POA: Diagnosis present

## 2017-09-02 DIAGNOSIS — J441 Chronic obstructive pulmonary disease with (acute) exacerbation: Secondary | ICD-10-CM | POA: Diagnosis present

## 2017-09-02 DIAGNOSIS — I272 Pulmonary hypertension, unspecified: Secondary | ICD-10-CM | POA: Diagnosis present

## 2017-09-02 LAB — URINALYSIS, ROUTINE W REFLEX MICROSCOPIC
Bilirubin Urine: NEGATIVE
GLUCOSE, UA: NEGATIVE mg/dL
HGB URINE DIPSTICK: NEGATIVE
Ketones, ur: NEGATIVE mg/dL
LEUKOCYTES UA: NEGATIVE
Nitrite: NEGATIVE
PROTEIN: NEGATIVE mg/dL
pH: 5 (ref 5.0–8.0)

## 2017-09-02 LAB — COMPREHENSIVE METABOLIC PANEL
ALT: 17 U/L (ref 17–63)
ANION GAP: 14 (ref 5–15)
AST: 19 U/L (ref 15–41)
Albumin: 2.6 g/dL — ABNORMAL LOW (ref 3.5–5.0)
Alkaline Phosphatase: 70 U/L (ref 38–126)
BUN: 26 mg/dL — ABNORMAL HIGH (ref 6–20)
CHLORIDE: 100 mmol/L — AB (ref 101–111)
CO2: 18 mmol/L — ABNORMAL LOW (ref 22–32)
Calcium: 9.4 mg/dL (ref 8.9–10.3)
Creatinine, Ser: 1.45 mg/dL — ABNORMAL HIGH (ref 0.61–1.24)
GFR, EST AFRICAN AMERICAN: 52 mL/min — AB (ref >60)
GFR, EST NON AFRICAN AMERICAN: 45 mL/min — AB (ref >60)
Glucose, Bld: 171 mg/dL — ABNORMAL HIGH (ref 65–99)
POTASSIUM: 4.5 mmol/L (ref 3.5–5.1)
Sodium: 132 mmol/L — ABNORMAL LOW (ref 135–145)
Total Bilirubin: 1.5 mg/dL — ABNORMAL HIGH (ref 0.3–1.2)
Total Protein: 6.6 g/dL (ref 6.5–8.1)

## 2017-09-02 LAB — PROCALCITONIN: PROCALCITONIN: 0.39 ng/mL

## 2017-09-02 LAB — TSH: TSH: 0.894 u[IU]/mL (ref 0.350–4.500)

## 2017-09-02 LAB — LACTIC ACID, PLASMA
LACTIC ACID, VENOUS: 1.3 mmol/L (ref 0.5–1.9)
LACTIC ACID, VENOUS: 0.8 mmol/L (ref 0.5–1.9)

## 2017-09-02 LAB — CBC
HCT: 45.1 % (ref 39.0–52.0)
Hemoglobin: 15.3 g/dL (ref 13.0–17.0)
MCH: 28.9 pg (ref 26.0–34.0)
MCHC: 33.9 g/dL (ref 30.0–36.0)
MCV: 85.3 fL (ref 78.0–100.0)
Platelets: 494 10*3/uL — ABNORMAL HIGH (ref 150–400)
RBC: 5.29 MIL/uL (ref 4.22–5.81)
RDW: 14.6 % (ref 11.5–15.5)
WBC: 21 10*3/uL — ABNORMAL HIGH (ref 4.0–10.5)

## 2017-09-02 LAB — TROPONIN I: Troponin I: <0.03 ng/mL (ref <0.03)

## 2017-09-02 LAB — I-STAT CG4 LACTIC ACID, ED
Lactic Acid, Venous: 2.73 mmol/L (ref 0.5–1.9)
Lactic Acid, Venous: 1.43 mmol/L (ref 0.5–1.9)

## 2017-09-02 LAB — I-STAT TROPONIN, ED: TROPONIN I, POC: 0.01 ng/mL (ref 0.00–0.08)

## 2017-09-02 LAB — BRAIN NATRIURETIC PEPTIDE: B NATRIURETIC PEPTIDE 5: 184.2 pg/mL — AB (ref 0.0–100.0)

## 2017-09-02 MED ORDER — SODIUM CHLORIDE 0.9 % IV BOLUS (SEPSIS)
1000.0000 mL | Freq: Once | INTRAVENOUS | Status: AC
  Administered 2017-09-02: 1000 mL via INTRAVENOUS

## 2017-09-02 MED ORDER — IPRATROPIUM BROMIDE 0.02 % IN SOLN
0.5000 mg | Freq: Four times a day (QID) | RESPIRATORY_TRACT | Status: DC
  Filled 2017-09-02: qty 2.5

## 2017-09-02 MED ORDER — FLUOXETINE HCL 20 MG PO CAPS
40.0000 mg | ORAL_CAPSULE | Freq: Every day | ORAL | Status: DC
  Administered 2017-09-03 – 2017-09-07 (×5): 40 mg via ORAL
  Filled 2017-09-02 (×5): qty 2

## 2017-09-02 MED ORDER — HYDROCODONE-ACETAMINOPHEN 5-325 MG PO TABS
1.0000 | ORAL_TABLET | Freq: Three times a day (TID) | ORAL | Status: DC | PRN
  Administered 2017-09-03 – 2017-09-08 (×8): 1 via ORAL
  Filled 2017-09-02 (×10): qty 1

## 2017-09-02 MED ORDER — SODIUM CHLORIDE 0.9 % IV SOLN: 500.0000 mg | INTRAVENOUS | Status: DC

## 2017-09-02 MED ORDER — SIMVASTATIN 20 MG PO TABS
20.0000 mg | ORAL_TABLET | Freq: Every evening | ORAL | Status: DC
  Administered 2017-09-03: 20 mg via ORAL
  Filled 2017-09-02: qty 1

## 2017-09-02 MED ORDER — ALBUTEROL SULFATE (2.5 MG/3ML) 0.083% IN NEBU: 2.5000 mg | INHALATION_SOLUTION | RESPIRATORY_TRACT | Status: DC | PRN

## 2017-09-02 MED ORDER — ENOXAPARIN SODIUM 40 MG/0.4ML ~~LOC~~ SOLN: 40.0000 mg | SUBCUTANEOUS | Status: DC

## 2017-09-02 MED ORDER — IOPAMIDOL (ISOVUE-300) INJECTION 61%
INTRAVENOUS | Status: AC
  Administered 2017-09-02: 80 mL
  Filled 2017-09-02: qty 100

## 2017-09-02 MED ORDER — METHYLPREDNISOLONE SODIUM SUCC 125 MG IJ SOLR
60.0000 mg | Freq: Two times a day (BID) | INTRAMUSCULAR | Status: DC
  Administered 2017-09-02 – 2017-09-06 (×8): 60 mg via INTRAVENOUS
  Filled 2017-09-02 (×8): qty 2

## 2017-09-02 MED ORDER — SODIUM CHLORIDE 0.9 % IV SOLN
1.0000 g | INTRAVENOUS | Status: DC
  Administered 2017-09-02: 1 g via INTRAVENOUS
  Filled 2017-09-02: qty 10

## 2017-09-02 MED ORDER — BUDESONIDE 0.5 MG/2ML IN SUSP: 0.5000 mg | Freq: Two times a day (BID) | RESPIRATORY_TRACT | Status: DC

## 2017-09-02 MED ORDER — DULOXETINE HCL 30 MG PO CPEP
30.0000 mg | ORAL_CAPSULE | Freq: Every day | ORAL | Status: DC
Start: 2017-09-03 — End: 2017-09-08
  Administered 2017-09-03 – 2017-09-08 (×6): 30 mg via ORAL
  Filled 2017-09-02 (×6): qty 1

## 2017-09-02 MED ORDER — AMIODARONE HCL IN DEXTROSE 360-4.14 MG/200ML-% IV SOLN
30.0000 mg/h | INTRAVENOUS | Status: DC
  Administered 2017-09-03: 30 mg/h via INTRAVENOUS
  Filled 2017-09-02 (×3): qty 200

## 2017-09-02 MED ORDER — IPRATROPIUM-ALBUTEROL 0.5-2.5 (3) MG/3ML IN SOLN
3.0000 mL | Freq: Four times a day (QID) | RESPIRATORY_TRACT | Status: DC
  Administered 2017-09-03: 3 mL via RESPIRATORY_TRACT
  Filled 2017-09-02: qty 3

## 2017-09-02 MED ORDER — APIXABAN 5 MG PO TABS
5.0000 mg | ORAL_TABLET | Freq: Two times a day (BID) | ORAL | Status: DC
  Administered 2017-09-02 – 2017-09-05 (×6): 5 mg via ORAL
  Filled 2017-09-02 (×7): qty 1

## 2017-09-02 MED ORDER — ACETAMINOPHEN 325 MG PO TABS
650.0000 mg | ORAL_TABLET | Freq: Four times a day (QID) | ORAL | Status: DC | PRN
  Administered 2017-09-03 – 2017-09-08 (×4): 650 mg via ORAL
  Filled 2017-09-02 (×4): qty 2

## 2017-09-02 MED ORDER — SODIUM CHLORIDE 0.9 % IV SOLN
1250.0000 mg | INTRAVENOUS | Status: DC
  Administered 2017-09-03: 1250 mg via INTRAVENOUS
  Filled 2017-09-02: qty 1250

## 2017-09-02 MED ORDER — AMIODARONE LOAD VIA INFUSION
150.0000 mg | Freq: Once | INTRAVENOUS | Status: AC
  Administered 2017-09-02: 150 mg via INTRAVENOUS
  Filled 2017-09-02: qty 83.34

## 2017-09-02 MED ORDER — SODIUM CHLORIDE 0.9 % IV SOLN: 1.0000 g | INTRAVENOUS | Status: DC

## 2017-09-02 MED ORDER — ONDANSETRON HCL 4 MG/2ML IJ SOLN: 4.0000 mg | Freq: Four times a day (QID) | INTRAMUSCULAR | Status: DC | PRN

## 2017-09-02 MED ORDER — PIPERACILLIN-TAZOBACTAM 3.375 G IVPB
3.3750 g | Freq: Three times a day (TID) | INTRAVENOUS | Status: DC
  Administered 2017-09-02 – 2017-09-05 (×8): 3.375 g via INTRAVENOUS
  Filled 2017-09-02 (×9): qty 50

## 2017-09-02 MED ORDER — SODIUM CHLORIDE 0.9 % IV SOLN
INTRAVENOUS | Status: DC
  Administered 2017-09-02 – 2017-09-03 (×2): via INTRAVENOUS

## 2017-09-02 MED ORDER — FINASTERIDE 5 MG PO TABS
5.0000 mg | ORAL_TABLET | Freq: Every day | ORAL | Status: DC
  Administered 2017-09-03 – 2017-09-08 (×6): 5 mg via ORAL
  Filled 2017-09-02 (×6): qty 1

## 2017-09-02 MED ORDER — IPRATROPIUM-ALBUTEROL 0.5-2.5 (3) MG/3ML IN SOLN: 3.0000 mL | RESPIRATORY_TRACT | Status: DC

## 2017-09-02 MED ORDER — VANCOMYCIN HCL 10 G IV SOLR
2000.0000 mg | Freq: Once | INTRAVENOUS | Status: AC
  Administered 2017-09-02: 2000 mg via INTRAVENOUS
  Filled 2017-09-02: qty 2000

## 2017-09-02 MED ORDER — ONDANSETRON HCL 4 MG/2ML IJ SOLN
4.0000 mg | Freq: Once | INTRAMUSCULAR | Status: AC
  Administered 2017-09-02: 4 mg via INTRAVENOUS
  Filled 2017-09-02: qty 2

## 2017-09-02 MED ORDER — ADENOSINE 6 MG/2ML IV SOLN
INTRAVENOUS | Status: AC
  Administered 2017-09-02: 12 mg
  Filled 2017-09-02: qty 4

## 2017-09-02 MED ORDER — SACUBITRIL-VALSARTAN 24-26 MG PO TABS
1.0000 | ORAL_TABLET | Freq: Two times a day (BID) | ORAL | Status: DC
  Administered 2017-09-02 – 2017-09-08 (×12): 1 via ORAL
  Filled 2017-09-02 (×12): qty 1

## 2017-09-02 MED ORDER — MORPHINE SULFATE (PF) 4 MG/ML IV SOLN: 2.0000 mg | INTRAVENOUS | Status: DC | PRN

## 2017-09-02 MED ORDER — MORPHINE SULFATE (PF) 4 MG/ML IV SOLN: 4.0000 mg | Freq: Once | INTRAVENOUS | Status: DC

## 2017-09-02 MED ORDER — AZITHROMYCIN 500 MG IV SOLR
500.0000 mg | Freq: Once | INTRAVENOUS | Status: AC
  Administered 2017-09-02: 500 mg via INTRAVENOUS
  Filled 2017-09-02: qty 500

## 2017-09-02 MED ORDER — AMIODARONE HCL IN DEXTROSE 360-4.14 MG/200ML-% IV SOLN
60.0000 mg/h | INTRAVENOUS | Status: AC
  Administered 2017-09-02 (×2): 60 mg/h via INTRAVENOUS
  Filled 2017-09-02 (×2): qty 200

## 2017-09-02 MED ORDER — SODIUM CHLORIDE 0.9 % IV SOLN: 1.0000 g | Freq: Once | INTRAVENOUS | Status: DC

## 2017-09-02 NOTE — Consult Note (Addendum)
Cardiology Consultation:   Patient ID: Travis Ferguson; [id number]; 03-16-1941   Admit date: 09/02/2017 Date of Consult: 09/02/2017  Primary Care Provider: Crist Infante, MD Primary Cardiologist: Dr Gwenlyn Found Primary Electrophysiologist:  n/a   Patient Profile:   Travis Ferguson is a 77 y.o. male with a hx of HTN, HLD, FH CAD, obesity, SDH after a fall, nl MV 2015, echo 2017, sarcoid, OSA on pm O2, COPD, who is being seen today for the evaluation of tachycardia at the request of Dr Lorin Mercy.  History of Present Illness:   Travis Ferguson has not been very active recently due to MS issues, but was not having cardiac issues. He has done pulmonary rehab in the past, but not recently.   Three days ago, pt noticed possible hematuria. He noticed increasing DOE. He was following his HR, which has been trending up. He was 114 this am. His O2 sats were lower than usual as well.   Today, he went to Dr Joylene Draft and his O2 sats were low. He was sent to the ER. He was diagnosed with PNA and has a pleural effusion.   His HR was in the 160s and ECG was not clear, possible SVT vs ST. Cards asked to see.  Travis Ferguson is unaware of the arrhythmia. He has not had palpitations, no CP, no presyncope or syncope. No awareness of elevated HR other than his HR readings.  He is currently on BiPAP and his breathing has improved.  However, his heart rate is still very elevated.   Past Medical History:  Diagnosis Date  . Acute on chronic combined systolic and diastolic CHF (congestive heart failure) ([place]) 02/24/2015  . Anxiety   . Arthritis    "about q joint I've got" (09/22/2015)  . Atherosclerosis   . CAP (community acquired pneumonia) 02/24/2015  . Cervical disc disease   . Chronic lower back pain    chronic back pain/under pain management  . Colon polyps 08/02/2014   Tubular adenoma x 3, Hyperplastic-1  . COPD (chronic obstructive pulmonary disease) ([place])    a. Former Airline pilot, also smoked a pipe.  . Coronary artery  calcification seen on CAT scan   . Depression   . Dyslipidemia   . Dyspnea    chronic  . GERD (gastroesophageal reflux disease)   . Hiatal hernia    sensation problem ("right lateral side hip to knee").  . History of blood transfusion "numerous"  . History of oxygen administration    @ 2 l/m nasally at bedtime.(09/22/2015)  . Hypertension   . Inguinal hernia    right side  . Migraine    "years since I've had one" (09/22/2015)  . OSA (obstructive sleep apnea)    does not use CPAP; "just oxygen" (09/22/2015)  . PTSD (post-traumatic stress disorder)   . Rhinitis   . Sarcoidosis   . Sensation problem    right side-lateral hip to knee" decreased sensation and tingling feeling" -has informed Dr. Joylene Draft, Dr. Henrene Pastor, Dr. Lucia Gaskins of this.  . Sepsis ([place])   . Steroid-induced hyperglycemia 02/25/2015  . Subdural hematoma ([place]) 11/10   a. 2010 s/p surgery - diagnosed several weeks after a fall.    Past Surgical History:  Procedure Laterality Date  . BACK SURGERY    . BRAIN SURGERY  04/2009   right frontal"hemorrhage evacuation from fall injury"  . CARDIAC CATHETERIZATION N/A 01/23/2015   Procedure: Right/Left Heart Cath and Coronary Angiography;  Surgeon: Lorretta Harp, MD;  Location: [facility];  Service: Cardiovascular;  Laterality: N/A;  . CHOLECYSTECTOMY N/A 11/08/2014   Procedure: LAPAROSCOPIC CHOLECYSTECTOMY ;  Surgeon: Alphonsa Overall, MD;  Location: [facility];  Service: General;  Laterality: N/A;  . ERCP N/A 11/07/2014   Procedure: ENDOSCOPIC RETROGRADE CHOLANGIOPANCREATOGRAPHY (ERCP);  Surgeon: Irene Shipper, MD;  Location: [facility];  Service: Endoscopy;  Laterality: N/A;  . GALLBLADDER SURGERY  09/2014   drain tube placed  . INGUINAL HERNIA REPAIR Left   . KNEE ARTHROSCOPY Right   . LUMBAR LAMINECTOMY/DECOMPRESSION MICRODISCECTOMY Right 12/15/2012   Procedure: LUMBAR LAMINECTOMY/DECOMPRESSION MICRODISCECTOMY 1 LEVEL;  Surgeon: Elaina Hoops, MD;  Location: [facility];  Service:  Neurosurgery;  Laterality: Right;  Right Lumbar four-five laminectomy/foraminotomy  . REVERSE SHOULDER ARTHROPLASTY Left 09/22/2015   Procedure: LEFT REVERSE TOTAL SHOULDER ARTHROPLASTY;  Surgeon: Netta Cedars, MD;  Location: [facility];  Service: Orthopedics;  Laterality: Left;  . REVERSE TOTAL SHOULDER ARTHROPLASTY Left 09/22/2015  . VIDEO BRONCHOSCOPY  07/28/2012   Procedure: VIDEO BRONCHOSCOPY WITH FLUORO;  Surgeon: Chesley Mires, MD;  Location: [facility];  Service: Cardiopulmonary;  Laterality: Bilateral;     Prior to Admission medications   Medication Sig Start Date End Date Taking? Authorizing Provider  albuterol (PROAIR HFA) 108 (90 Base) MCG/ACT inhaler 1-2 PUFFS EVERY 4-6 HOURS AS NEEDED for shortness of breath 08/08/17  Yes Chesley Mires, MD  budesonide-formoterol (SYMBICORT) 160-4.5 MCG/ACT inhaler Inhale 2 puffs into the lungs 2 (two) times daily. 07/15/17  Yes Parrett, Tammy S, NP  DULoxetine (CYMBALTA) 30 MG capsule Take 30 mg by mouth daily.   Yes [provider]  ENTRESTO 24-26 MG TAKE 1 TABLET BY MOUTH 2 (TWO) TIMES DAILY. 05/14/16  Yes Lorretta Harp, MD  finasteride (PROSCAR) 5 MG tablet Take 5 mg by mouth daily.  01/07/13  Yes [provider]  FLUoxetine (PROZAC) 40 MG capsule Take 40 mg by mouth daily with breakfast.    Yes [provider]  furosemide (LASIX) 20 MG tablet Take 1 tablet (20 mg total) by mouth daily. 05/12/15  Yes Lorretta Harp, MD  HYDROcodone-acetaminophen (NORCO/VICODIN) 5-325 MG tablet Take 1 tablet by mouth every 8 (eight) hours as needed for moderate pain.    Yes [provider]  ipratropium (ATROVENT) 0.02 % nebulizer solution Take 2.5 mLs (0.5 mg total) by nebulization 4 (four) times daily. 07/15/17  Yes Parrett, Tammy S, NP  metoprolol tartrate (LOPRESSOR) 25 MG tablet TAKE 1 TABLET (25 MG TOTAL) BY MOUTH 2 (TWO) TIMES DAILY. 09/11/15  Yes Lorretta Harp, MD  Multiple Vitamin (MULTIVITAMIN) tablet Take 1 tablet by mouth  daily.   Yes [provider]  predniSONE (DELTASONE) 10 MG tablet 3 pills daily for 3 days, 2 pills daily for 3 days, then 1 pill daily 08/08/17  Yes Chesley Mires, MD  simvastatin (ZOCOR) 20 MG tablet Take 20 mg by mouth every evening.  04/10/14  Yes [provider]  sodium chloride (OCEAN) 0.65 % SOLN nasal spray Place 1-2 sprays into both nostrils daily as needed for congestion.   Yes [provider]  spironolactone (ALDACTONE) 25 MG tablet Take 1 tablet (25 mg total) by mouth daily. 02/27/15  Yes Rama, Venetia Maxon, MD  OXYGEN Inhale 2 L/min into the lungs at bedtime.    [provider]  SYMBICORT 160-4.5 MCG/ACT inhaler INHALE 2 PUFFS INTO THE LUNGS 2 (TWO) TIMES DAILY. Patient not taking: Reported on 09/02/2017 11/06/16   Chesley Mires, MD    Inpatient Medications: Scheduled Meds: .  methylPREDNISolone (SOLU-MEDROL) injection  60 mg Intravenous Q12H  .  morphine injection  4 mg Intravenous Once   Continuous Infusions: . cefTRIAXone (ROCEPHIN)  IV 1 g (09/02/17 1716)  . [START ON 09/03/2017] vancomycin    . vancomycin 2,000 mg (09/02/17 1737)   PRN Meds: acetaminophen, morphine injection, ondansetron (ZOFRAN) IV  Allergies:    Allergies  Allergen Reactions  . Statins Other (See Comments)    Muscle aches - tolerating Vytorin    Social History:   Social History   Socioeconomic History  . Marital status: Married    Spouse name: Not on file  . Number of children: 2  . Years of education: 37  . Highest education level: Not on file  Social Needs  . Financial resource strain: Not on file  . Food insecurity - worry: Not on file  . Food insecurity - inability: Not on file  . Transportation needs - medical: Not on file  . Transportation needs - non-medical: Not on file  Occupational History  . 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 Smoking status: Former Smoker    Packs/day: 0.00    Years: 30.00    Pack years: 0.00    Types:  Pipe    Last attempt to quit: 06/24/1998    Years since quitting: 19.2  . Smokeless tobacco: Never Used  Substance and Sexual Activity  . Alcohol use: No  . Drug use: No  . Sexual activity: Not Currently  Other Topics Concern  . Not on file  Social History Narrative   Drinks about 6-7 caffeine drinks a day     Family History:   Family History  Problem Relation Age of Onset  . Heart disease Mother   . Heart disease Father   . Colon polyps Brother   . Congestive Heart Failure Brother   . Coronary artery disease Unknown   . Stomach cancer Paternal Grandmother   . Colon cancer Neg Hx   . Pancreatic cancer Neg Hx   . Rectal cancer Neg Hx   . Esophageal cancer Neg Hx    Family Status:  Family Status  Relation Name Status  . Mother  Deceased  . Father  Deceased  . Brother  Alive  . Unknown  (Not Specified)  . PGM  (Not Specified)  . Neg Hx  (Not Specified)    ROS:  Please see the history of present illness.  All other ROS reviewed and negative.     Physical Exam/Data:   Vitals:   09/02/17 1700 09/02/17 1715 09/02/17 1733 09/02/17 1734  BP: 108/90 97/70 94/73  94/73  Pulse: (!) 55 (!) 164 (!) 168 (!) 139  Resp:    (!) 27  Temp:      TempSrc:      SpO2: 97% 94% 100% 98%  Weight:      Height:        Intake/Output Summary (Last 24 hours) at 09/02/2017 1744 Last data filed at 09/02/2017 1737 Gross per 24 hour  Intake 1250 ml  Output -  Net 1250 ml   Filed Weights   09/02/17 1257  Weight: 259 lb (117.5 kg)   Body mass index is 35.13 kg/m.  General:  Well nourished, well developed, in no acute distress HEENT: normal Lymph: no adenopathy Neck: no JVD seen, difficult to assess 2nd body habitus Endocrine:  No thryomegaly Vascular: No carotid bruits; 4/4 extremity pulses 1-2+, without bruits  Cardiac:  normal S1, S2; rapid RRR; no murmur  Lungs: L>R rales bases bilaterally, no wheezing, rhonchi   Abd: soft, nontender, no hepatomegaly  Ext: no  edema Musculoskeletal:  No deformities, BUE and BLE strength normal and equal Skin: warm and dry  Neuro:  CNs 2-12 intact, no focal abnormalities noted Psych:  Normal affect   EKG:  The EKG was personally reviewed and demonstrates:  SVT vs Atrial flutter vs ST, HR 160 Telemetry:  Telemetry was personally reviewed and demonstrates:  Narrow complex tachycardia, HR 160, with adenosine, heart rate slowed and flutter waves were clearly visible.  Relevant CV Studies:  ECHO: 05/03/2016 - Left ventricle: The cavity size was normal. There was moderate   hypertrophy of the septum with mild posterior wall hypertrophy.   Systolic function was mildly to moderately reduced. The estimated   ejection fraction was in the range of 40% to 45%. Wall motion was   normal; there were no regional wall motion abnormalities. Doppler   parameters are consistent with abnormal left ventricular   relaxation (grade 1 diastolic dysfunction). - Aortic valve: Transvalvular velocity was within the normal range.   There was no stenosis. There was trivial regurgitation. - Mitral valve: Transvalvular velocity was within the normal range.   There was no evidence for stenosis. There was trivial   regurgitation. - Left atrium: The atrium was mildly dilated. - Right ventricle: Systolic function was normal. RV systolic   pressure (S, est): 29 mm Hg. - Atrial septum: No defect or patent foramen ovale was identified   by color flow Doppler. - Tricuspid valve: There was trivial regurgitation. - Pulmonic valve: There was trivial regurgitation. - Pulmonary arteries: Systolic pressure was within the normal   range. PA peak pressure: 29 mm Hg (S). Impressions: - Compared with echo 10/2015, LVEF is relatively unchaged. Systolic   function is difficult to assess and may be underestimated due to   frequent PVCs.  CATH: 01/23/2015  Ost RCA to Prox RCA lesion, 40% stenosed.  Ost LAD to Prox LAD lesion, 40% stenosed.  Ost LM  to LM lesion, 40% stenosed.  There is severe left ventricular systolic dysfunction. 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 He does have what appears to be a kink in his left main with probably 40% obstruction and otherwise mild CAD in his proximal LAD and RCA but did not appear obstructive. He has severe global LV dysfunction with elevated filling pressure and high wedge and RA pressure. I believe medical therapy is warranted at this time. She is removed and pressure held to achieve hemostasis. The patient left the lab in stable condition. He will be hydrated for the next several hours and discharged home. I'll see him back in the office in several weeks for follow-up  Laboratory Data:  Chemistry Recent Labs  Lab 09/02/17 1315  NA 132*  K 4.5  CL 100*  CO2 18*  GLUCOSE 171*  BUN 26*  CREATININE 1.45*  CALCIUM 9.4  GFRNONAA 45*  GFRAA 52*  ANIONGAP 14    Lab Results  Component Value Date   ALT 17 09/02/2017   AST 19 09/02/2017   ALKPHOS 70 09/02/2017   BILITOT 1.5 (H) 09/02/2017   Hematology Recent Labs  Lab 09/02/17 1315  WBC 21.0*  RBC 5.29  HGB 15.3  HCT 45.1  MCV 85.3  MCH 28.9  MCHC 33.9  RDW 14.6  PLT 494*   Cardiac Enzymes Recent Labs  Lab 09/02/17 1317  TROPIPOC 0.01    BNP Recent Labs  Lab 09/02/17 1315  BNP 184.2*  Radiology/Studies:  Ct Abdomen Pelvis W Contrast  Result Date: 09/02/2017 CLINICAL DATA:  Hematuria for 2 days. Diagnosed with urinary tract infection today. Hypotensive. On prednisone taper for breathing difficulties. History of CHF, cholecystectomy, hypertension, sarcoidosis. EXAM: CT ABDOMEN AND PELVIS WITH CONTRAST TECHNIQUE: Multidetector CT imaging of the abdomen and pelvis was performed using the standard protocol following bolus administration of intravenous contrast. CONTRAST:  76mL ISOVUE-300 IOPAMIDOL (ISOVUE-300) INJECTION 61% COMPARISON:  CT abdomen and pelvis December 28, 2014 inch chest radiograph September 02, 2017  FINDINGS: LOWER CHEST: Small LEFT pleural effusion with hepatization LEFT lower lobe. Associated centrilobular ground-glass nodules. Included heart size is normal. No pericardial effusion. Mild coronary artery calcifications. HEPATOBILIARY: Status post cholecystectomy.  Negative CT liver. PANCREAS: Normal. SPLEEN: Normal. ADRENALS/URINARY TRACT: Kidneys are orthotopic, demonstrating symmetric enhancement. No nephrolithiasis, hydronephrosis or solid renal masses. 2.4 cm homogeneously hypodense benign-appearing cyst LEFT interpolar kidney. Too small to characterize hypodensity upper pole RIGHT kidney. The unopacified ureters are normal in course and caliber. Delayed imaging through the kidneys demonstrates symmetric prompt contrast excretion within the proximal urinary collecting system. Urinary bladder is decompressed and unremarkable. Normal adrenal glands. STOMACH/BOWEL: The stomach, small and large bowel are normal in course and caliber without inflammatory changes. Small debris-filled duodenal diverticulum. Moderate colonic diverticulosis. Normal appendix. VASCULAR/LYMPHATIC: Aortoiliac vessels are normal in course and caliber. Moderate calcific atherosclerosis. No lymphadenopathy by CT size criteria. REPRODUCTIVE: Normal. OTHER: No intraperitoneal free fluid or free air. New RIGHT lower quadrant fat stranding associated with large RIGHT fat containing inguinal hernia. Moderate LEFT fat containing inguinal hernia, status post herniorrhaphy. Focally thickened RIGHT pericolic gutter with calcification of prior site of inflammatory process. Small fat containing paraumbilical hernias. MUSCULOSKELETAL: Nonacute. Severe L4-5 and L5-S1 degenerative discs. Severe LEFT L4-5 and L5-S1 neural foraminal narrowing. IMPRESSION: 1. Large LEFT lower lobe consolidation consistent with pneumonia. Small LEFT pleural effusion. 2. Focal fat necrosis RIGHT pelvis associated with large fat containing RIGHT inguinal hernia. 3. No  nephrolithiasis, hydronephrosis or pyelonephritis. Aortic Atherosclerosis (ICD10-I70.0). Electronically Signed   By: Elon Alas M.D.   On: 09/02/2017 15:42   Dg Chest Port 1 View  Result Date: 09/02/2017 CLINICAL DATA:  Chest tightness, shortness of breath and nausea over the last 2 weeks. EXAM: PORTABLE CHEST 1 VIEW COMPARISON:  08/08/2017 FINDINGS: The patient is developed a left effusion with left lower lobe atelectasis and likely pneumonia. Minimal patchy density at the right lung base. Aortic atherosclerosis again noted. Upper lungs are clear. IMPRESSION: Development of left effusion and left lower lobe volume loss/pneumonia. Electronically Signed   By: Nelson Chimes M.D.   On: 09/02/2017 15:01    Assessment and Plan:   1.  Atrial flutter, rapid ventricular response -The arrhythmia is driven by a combination of acute illness and chronic lung disease.   -His blood pressure is too low to use beta-blockers or Cardizem for rate control. -We will try amiodarone for rate control. -As his heart rate is consistently elevated in the atrial flutter, it is likely that he was not in this prior to today.  However, he is unlikely to maintain sinus rhythm until his acute illness has been more extensively treated. -Therefore, we will use the amiodarone for rate control. -He has been started on Eliquis for anticoagulation. -Check an echocardiogram. -Tentative plan is for TEE cardioversion on Friday - CHA2DS2VASc= 4 (age x 2, HTN, CAD) -Check a TSH, LFTs are mildly abnormal, they will have to be followed.  If he remains on amiodarone, he will need  PFTs.  2.  Chronic systolic CHF: -EF is 93-81% by echocardiogram last year, recheck -He also had grade 1 diastolic dysfunction. -His BUN and creatinine are elevated above baseline, he is not volume overloaded by exam. -Agree with hydration for sepsis, but we will have to watch his volume status closely to avoid volume overload.  Otherwise, per Dr.  Lorin Mercy Principal Problem:   Left lower lobe pneumonia [facility]) Active Problems:   Obstructive sleep apnea   COPD with chronic bronchitis ([place])   Pulmonary sarcoidosis ([place])   Essential hypertension   Hyperlipidemia   Acute on chronic respiratory failure with hypoxia ([facility])   Sepsis ([facility])   Tachycardia   Chronic pain syndrome     For questions or updates, please contact [facility] Please consult [email or web address] for contact info under Cardiology/STEMI.   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 He also has a long history of COPD on nocturnal O2. We are asked to see him for respiratory failure and tachycardia. He has an EF in the 40% range. I performed cardiac catheterization on him in 2016 revealing noncritical CAD with moderate LV dysfunction which has improved somewhat with optimal medical therapy. He's had increasing dyspnea last several days worse today. His chest x-ray shows a left lower lobe infiltrate consistent with community-acquired pneumonia. White count was 21,000. He is on BiPAP. His heart rate is 160. I administered 12 mg of adenosine IV which should clear flutter waves. His blood pressure soft in the 100 range despite IV fluid resuscitation. He is not a candidate for IV diltiazem given his blood pressure. I'm going to start him on Eliquis as well as IV amiodarone. He is currently on IV antibiotics as well. He may convert pharmacologically otherwise, once this infectious process adequately treated if he remains in atrial flutter he can be cardioverted. We will follow along closely.   Lorretta Harp, M.D., [place], [facility], [place] [address] [place], [place]  [address]  [phone] 09/02/2017 8:50 PM

## 2017-09-02 NOTE — ED Notes (Signed)
Pt transported to CT ?

## 2017-09-02 NOTE — Progress Notes (Addendum)
Pharmacy Antibiotic Note  Travis Ferguson is a 77 y.o. male admitted on 09/02/2017 with pneumonia.  Pharmacy has been consulted for vancomycin dosing.   Normalized CrCl ~7ml/min.  Plan: Continue azithromycin and ceftriaxone per MD Give vancomycin 2g IV x 1, then start vancomycin 1,250mg  IV Q24h Monitor clinical picture, renal function, VT prn F/U C&S, abx deescalation / LOT  Addendum:  Stop ceftriaxone and azithromycin Start Zosyn 3.375 gm IV q8h (4 hour infusion)   Height: 6' (182.9 cm) Weight: 259 lb (117.5 kg) IBW/kg (Calculated) : 77.6  Temp (24hrs), Avg:97.7 F (36.5 C), Min:97.7 F (36.5 C), Max:97.7 F (36.5 C)  Recent Labs  Lab 09/02/17 1315 09/02/17 1401 09/02/17 1618  WBC 21.0*  --   --   CREATININE 1.45*  --   --   LATICACIDVEN  --  2.73* 1.43    Estimated Creatinine Clearance: 56.5 mL/min (A) (by C-G formula based on SCr of 1.45 mg/dL (H)).    Allergies  Allergen Reactions  . Statins Other (See Comments)    Muscle aches - tolerating Vytorin    Thank you for allowing pharmacy to be a part of this patient's care.  Reginia Naas 09/02/2017 4:49 PM

## 2017-09-02 NOTE — ED Notes (Signed)
Family at bedside. 

## 2017-09-02 NOTE — ED Notes (Signed)
Pt returned from CT °

## 2017-09-02 NOTE — ED Provider Notes (Signed)
[facility] EMERGENCY DEPARTMENT Provider Note   CSN: [id number] Arrival date & time: 09/02/17  1250     History   Chief Complaint Chief Complaint  Patient presents with  . Abdominal Pain  . Shortness of Breath  . Chest Pain    HPI Travis Ferguson is a 77 y.o. male.  Pt presents to the ED today with abdominal pain, sob, cp.  The pt has had sob for the past few days.  He saw the pulmonologist yesterday and breathing has improved.  The pt saw his pcp this morning for worsening of sx.  The pt did have n/v this morning.  Rocephin IM given in dr's office today.      Past Medical History:  Diagnosis Date  . Acute on chronic combined systolic and diastolic CHF (congestive heart failure) ([place]) 02/24/2015  . Anxiety   . Arthritis    "about q joint I've got" (09/22/2015)  . Atherosclerosis   . Calculus of gallbladder with acute cholecystitis and obstruction   . CAP (community acquired pneumonia) 02/24/2015  . Cervical disc disease   . Chronic lower back pain    chronic back pain/under pain management  . Colon polyps    adenomatous and hyperplastic  . Colon polyps 08/02/2014   Tubular adenoma x 3, Hyperplastic-1  . COPD (chronic obstructive pulmonary disease) ([place])    a. Former Airline pilot, also smoked a pipe.  . Coronary artery calcification seen on CAT scan   . Depression   . Diastolic dysfunction    grade 1 diastolic dysfunction on 2-D echo  . Dyslipidemia   . Dyspnea    chronic  . Encounter for biliary drainage tube placement    remains with drainage bag to right side for  biliary drainage.  . Frequent urination   . Gallstones   . Gangrenous cholecystitis 08/17/2014  . GERD (gastroesophageal reflux disease)   . Hiatal hernia    sensation problem ("right lateral side hip to knee").  . History of blood transfusion "numerous"  . History of oxygen administration    @ 2 l/m nasally at bedtime.(09/22/2015)  . Hypertension   . Inguinal hernia    right side   . Left ventricular dysfunction    ejection fraction of 25-30% with regional wall motion abnormalities.  . Migraine    "years since I've had one" (09/22/2015)  . OSA (obstructive sleep apnea)    does not use CPAP; "just oxygen" (09/22/2015)  . Pneumonia ~ 1958  . PTSD (post-traumatic stress disorder)   . Rhinitis   . Sarcoidosis   . Sensation problem    right side-lateral hip to knee" decreased sensation and tingling feeling" -has informed Dr. Joylene Draft, Dr. Henrene Pastor, Dr. Lucia Gaskins of this.  . Sepsis ([place])   . Sinus congestion    10-27-14 at present some issues-"not bad"  . Steroid-induced hyperglycemia 02/25/2015  . Subdural hematoma ([place]) 11/10   a. 2010 s/p surgery - diagnosed several weeks after a fall.    Patient Active Problem List   Diagnosis Date Noted  . S/P shoulder replacement 09/22/2015  . Chronic respiratory failure ([place]) 02/27/2015  . Acute on chronic combined systolic and diastolic CHF (congestive heart failure) ([place]) 02/24/2015  . CAP (community acquired pneumonia) 02/24/2015  . COPD with acute exacerbation ([place]) 02/24/2015  . Left ventricular dysfunction 01/20/2015  . S/P laparoscopic cholecystectomy 12/27/2014  . Other emphysema ([place])   . Splenic artery aneurysm ([place]) 04/27/2013  . Coronary artery calcification seen on CAT scan  04/26/2013  . Essential hypertension 04/26/2013  . Hyperlipidemia 04/26/2013  . Post-nasal drip 03/15/2013  . 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 Obstructive sleep apnea 05/11/2007  . COPD with chronic bronchitis ([place]) 05/11/2007    Past Surgical History:  Procedure Laterality Date  . BACK SURGERY    . BRAIN SURGERY  04/2009   right frontal"hemorrhage evacuation from fall injury"  . CARDIAC CATHETERIZATION N/A 01/23/2015   Procedure: Right/Left Heart Cath and Coronary Angiography;  Surgeon: Lorretta Harp, MD;  Location: [facility];  Service: Cardiovascular;  Laterality: N/A;  . CHOLECYSTECTOMY N/A 11/08/2014   Procedure:  LAPAROSCOPIC CHOLECYSTECTOMY ;  Surgeon: Alphonsa Overall, MD;  Location: [facility];  Service: General;  Laterality: N/A;  . ERCP N/A 11/07/2014   Procedure: ENDOSCOPIC RETROGRADE CHOLANGIOPANCREATOGRAPHY (ERCP);  Surgeon: Irene Shipper, MD;  Location: [facility];  Service: Endoscopy;  Laterality: N/A;  . GALLBLADDER SURGERY  09/2014   drain tube placed  . INGUINAL HERNIA REPAIR Left   . KNEE ARTHROSCOPY Right   . LUMBAR LAMINECTOMY/DECOMPRESSION MICRODISCECTOMY Right 12/15/2012   Procedure: LUMBAR LAMINECTOMY/DECOMPRESSION MICRODISCECTOMY 1 LEVEL;  Surgeon: Elaina Hoops, MD;  Location: [facility];  Service: Neurosurgery;  Laterality: Right;  Right Lumbar four-five laminectomy/foraminotomy  . REVERSE SHOULDER ARTHROPLASTY Left 09/22/2015   Procedure: LEFT REVERSE TOTAL SHOULDER ARTHROPLASTY;  Surgeon: Netta Cedars, MD;  Location: [facility];  Service: Orthopedics;  Laterality: Left;  . REVERSE TOTAL SHOULDER ARTHROPLASTY Left 09/22/2015  . VIDEO BRONCHOSCOPY  07/28/2012   Procedure: VIDEO BRONCHOSCOPY WITH FLUORO;  Surgeon: Chesley Mires, MD;  Location: [facility];  Service: Cardiopulmonary;  Laterality: Bilateral;       Home Medications    Prior to Admission medications   Medication Sig Start Date End Date Taking? Authorizing Provider  albuterol (PROAIR HFA) 108 (90 Base) MCG/ACT inhaler 1-2 PUFFS EVERY 4-6 HOURS AS NEEDED for shortness of breath 08/08/17  Yes Chesley Mires, MD  budesonide-formoterol (SYMBICORT) 160-4.5 MCG/ACT inhaler Inhale 2 puffs into the lungs 2 (two) times daily. 07/15/17  Yes Parrett, Tammy S, NP  DULoxetine (CYMBALTA) 30 MG capsule Take 30 mg by mouth daily.   Yes [provider]  ENTRESTO 24-26 MG TAKE 1 TABLET BY MOUTH 2 (TWO) TIMES DAILY. 05/14/16  Yes Lorretta Harp, MD  finasteride (PROSCAR) 5 MG tablet Take 5 mg by mouth daily.  01/07/13  Yes [provider]  FLUoxetine (PROZAC) 40 MG capsule Take 40 mg by mouth daily with breakfast.    Yes [provider]  furosemide (LASIX) 20 MG tablet Take 1 tablet (20 mg total) by mouth daily. 05/12/15  Yes Lorretta Harp, MD  HYDROcodone-acetaminophen (NORCO/VICODIN) 5-325 MG tablet Take 1 tablet by mouth every 8 (eight) hours as needed for moderate pain.    Yes [provider]  ipratropium (ATROVENT) 0.02 % nebulizer solution Take 2.5 mLs (0.5 mg total) by nebulization 4 (four) times daily. 07/15/17  Yes Parrett, Tammy S, NP  metoprolol tartrate (LOPRESSOR) 25 MG tablet TAKE 1 TABLET (25 MG TOTAL) BY MOUTH 2 (TWO) TIMES DAILY. 09/11/15  Yes Lorretta Harp, MD  Multiple Vitamin (MULTIVITAMIN) tablet Take 1 tablet by mouth daily.   Yes [provider]  predniSONE (DELTASONE) 10 MG tablet 3 pills daily for 3 days, 2 pills daily for 3 days, then 1 pill daily 08/08/17  Yes Chesley Mires, MD  simvastatin (ZOCOR) 20 MG tablet Take 20 mg by mouth every evening.  04/10/14  Yes [provider]  sodium chloride (OCEAN) 0.65 % SOLN nasal spray Place 1-2 sprays into both nostrils daily as needed for congestion.   Yes [provider]  spironolactone (ALDACTONE) 25 MG tablet Take 1 tablet (25 mg total) by mouth daily. 02/27/15  Yes Rama, Venetia Maxon, MD  budesonide-formoterol (SYMBICORT) 160-4.5 MCG/ACT inhaler Inhale 2 puffs into the lungs 2 (two) times daily. Patient not taking: Reported on 09/02/2017 08/08/17   Chesley Mires, MD  budesonide-formoterol [facility] 160-4.5 MCG/ACT inhaler Inhale 2 puffs into the lungs 2 (two) times daily. Patient not taking: Reported on 09/02/2017 09/01/17   Chesley Mires, MD  OXYGEN Inhale 2 L/min into the lungs at bedtime.    [provider]  SYMBICORT 160-4.5 MCG/ACT inhaler INHALE 2 PUFFS INTO THE LUNGS 2 (TWO) TIMES DAILY. Patient not taking: Reported on 09/02/2017 11/06/16   Chesley Mires, MD    Family History Family History  Problem Relation Age of Onset  . Heart disease Mother   . Heart disease Father   . Colon polyps Brother    . Congestive Heart Failure Brother   . Coronary artery disease Unknown   . Stomach cancer Paternal Grandmother   . Colon cancer Neg Hx   . Pancreatic cancer Neg Hx   . Rectal cancer Neg Hx   . Esophageal cancer Neg Hx     Social History Social History   Tobacco Use  . Smoking status: Former Smoker    Packs/day: 0.00    Years: 30.00    Pack years: 0.00    Types: Pipe    Last attempt to quit: 06/24/2008    Years since quitting: 9.1  . Smokeless tobacco: Never Used  Substance Use Topics  . Alcohol use: No  . Drug use: No     Allergies   Statins   Review of Systems Review of Systems  Respiratory: Positive for cough and shortness of breath.   Gastrointestinal: Positive for abdominal pain.  Genitourinary: Positive for hematuria.  All other systems reviewed and are negative.    Physical Exam Updated Vital Signs BP 96/73 (BP Location: Left Arm)   Pulse (!) 161   Temp 97.7 F (36.5 C) (Oral)   Resp (!) 25   Ht 6' (1.829 m)   Wt 117.5 kg (259 lb)   SpO2 93%   BMI 35.13 kg/m   Physical Exam  Constitutional: He is oriented to person, place, and time. He appears toxic. He appears ill. He appears distressed.  HENT:  Head: Normocephalic and atraumatic.  Mouth/Throat: Oropharynx is clear and moist.  Eyes: EOM are normal. Pupils are equal, round, and reactive to light.  Cardiovascular: Regular rhythm, normal heart sounds and intact distal pulses. Tachycardia present.  Pulmonary/Chest: He is in respiratory distress. He has rhonchi.  Abdominal: Soft. Normal appearance, normal aorta and bowel sounds are normal.  Neurological: He is alert and oriented to person, place, and time.  Skin: Capillary refill takes less than 2 seconds. He is diaphoretic.  Psychiatric: He has a normal mood and affect. His behavior is normal.  Nursing note and vitals reviewed.    ED Treatments / Results  Labs (all labs ordered are listed, but only abnormal results are displayed) Labs  Reviewed  CBC - Abnormal; Notable for the following components:      Result Value   WBC 21.0 (*)    Platelets 494 (*)    All other components within normal limits  COMPREHENSIVE METABOLIC PANEL - Abnormal; Notable for the following components:   Sodium  132 (*)    Chloride 100 (*)    CO2 18 (*)    Glucose, Bld 171 (*)    BUN 26 (*)    Creatinine, Ser 1.45 (*)    Albumin 2.6 (*)    Total Bilirubin 1.5 (*)    GFR calc non Af Amer 45 (*)    GFR calc Af Amer 52 (*)    All other components within normal limits  BRAIN NATRIURETIC PEPTIDE - Abnormal; Notable for the following components:   B Natriuretic Peptide 184.2 (*)    All other components within normal limits  I-STAT CG4 LACTIC ACID, ED - Abnormal; Notable for the following components:   Lactic Acid, Venous 2.73 (*)    All other components within normal limits  CULTURE, BLOOD (ROUTINE X 2)  CULTURE, BLOOD (ROUTINE X 2)  URINE CULTURE  URINALYSIS, ROUTINE W REFLEX MICROSCOPIC  I-STAT TROPONIN, ED  CBG MONITORING, ED  I-STAT CG4 LACTIC ACID, ED    EKG  EKG Interpretation  Date/Time:  Tuesday September 02 2017 13:00:11 EDT Ventricular Rate:  160 PR Interval:  176 QRS Duration: 96 QT Interval:  294 QTC Calculation: 479 R Axis:   45 Text Interpretation:  Sinus tachycardia Undetermined rhythm Possible Anterior infarct , age undetermined ST & T wave abnormality, consider inferolateral ischemia Abnormal ECG Confirmed by Isla Pence [phone]) on 09/02/2017 2:04:51 PM       Radiology Ct Abdomen Pelvis W Contrast  Result Date: 09/02/2017 CLINICAL DATA:  Hematuria for 2 days. Diagnosed with urinary tract infection today. Hypotensive. On prednisone taper for breathing difficulties. History of CHF, cholecystectomy, hypertension, sarcoidosis. EXAM: CT ABDOMEN AND PELVIS WITH CONTRAST TECHNIQUE: Multidetector CT imaging of the abdomen and pelvis was performed using the standard protocol following bolus administration of intravenous  contrast. CONTRAST:  70mL ISOVUE-300 IOPAMIDOL (ISOVUE-300) INJECTION 61% COMPARISON:  CT abdomen and pelvis December 28, 2014 inch chest radiograph September 02, 2017 FINDINGS: LOWER CHEST: Small LEFT pleural effusion with hepatization LEFT lower lobe. Associated centrilobular ground-glass nodules. Included heart size is normal. No pericardial effusion. Mild coronary artery calcifications. HEPATOBILIARY: Status post cholecystectomy.  Negative CT liver. PANCREAS: Normal. SPLEEN: Normal. ADRENALS/URINARY TRACT: Kidneys are orthotopic, demonstrating symmetric enhancement. No nephrolithiasis, hydronephrosis or solid renal masses. 2.4 cm homogeneously hypodense benign-appearing cyst LEFT interpolar kidney. Too small to characterize hypodensity upper pole RIGHT kidney. The unopacified ureters are normal in course and caliber. Delayed imaging through the kidneys demonstrates symmetric prompt contrast excretion within the proximal urinary collecting system. Urinary bladder is decompressed and unremarkable. Normal adrenal glands. STOMACH/BOWEL: The stomach, small and large bowel are normal in course and caliber without inflammatory changes. Small debris-filled duodenal diverticulum. Moderate colonic diverticulosis. Normal appendix. VASCULAR/LYMPHATIC: Aortoiliac vessels are normal in course and caliber. Moderate calcific atherosclerosis. No lymphadenopathy by CT size criteria. REPRODUCTIVE: Normal. OTHER: No intraperitoneal free fluid or free air. New RIGHT lower quadrant fat stranding associated with large RIGHT fat containing inguinal hernia. Moderate LEFT fat containing inguinal hernia, status post herniorrhaphy. Focally thickened RIGHT pericolic gutter with calcification of prior site of inflammatory process. Small fat containing paraumbilical hernias. MUSCULOSKELETAL: Nonacute. Severe L4-5 and L5-S1 degenerative discs. Severe LEFT L4-5 and L5-S1 neural foraminal narrowing. IMPRESSION: 1. Large LEFT lower lobe consolidation  consistent with pneumonia. Small LEFT pleural effusion. 2. Focal fat necrosis RIGHT pelvis associated with large fat containing RIGHT inguinal hernia. 3. No nephrolithiasis, hydronephrosis or pyelonephritis. Aortic Atherosclerosis (ICD10-I70.0). Electronically Signed   By: Elon Alas M.D.   On: 09/02/2017 15:42  Dg Chest Port 1 View  Result Date: 09/02/2017 CLINICAL DATA:  Chest tightness, shortness of breath and nausea over the last 2 weeks. EXAM: PORTABLE CHEST 1 VIEW COMPARISON:  08/08/2017 FINDINGS: The patient is developed a left effusion with left lower lobe atelectasis and likely pneumonia. Minimal patchy density at the right lung base. Aortic atherosclerosis again noted. Upper lungs are clear. IMPRESSION: Development of left effusion and left lower lobe volume loss/pneumonia. Electronically Signed   By: Nelson Chimes M.D.   On: 09/02/2017 15:01    Procedures Procedures (including critical care time)  Medications Ordered in ED Medications  morphine 4 MG/ML injection 4 mg (0 mg Intravenous Hold 09/02/17 1436)  azithromycin (ZITHROMAX) 500 mg in sodium chloride 0.9 % 250 mL IVPB (not administered)  sodium chloride 0.9 % bolus 1,000 mL (1,000 mLs Intravenous New Bag/Given 09/02/17 1345)  ondansetron (ZOFRAN) injection 4 mg (4 mg Intravenous Given 09/02/17 1403)  iopamidol (ISOVUE-300) 61 % injection (80 mLs  Contrast Given 09/02/17 1520)  sodium chloride 0.9 % bolus 1,000 mL (1,000 mLs Intravenous New Bag/Given 09/02/17 1507)     Initial Impression / Assessment and Plan / ED Course  I have reviewed the triage vital signs and the nursing notes.  Pertinent labs & imaging results that were available during my care of the patient were reviewed by me and considered in my medical decision making (see chart for details).  CRITICAL CARE Performed by: Isla Pence   Total critical care time: 30 minutes  Critical care time was exclusive of separately billable procedures and treating  other patients.  Critical care was necessary to treat or prevent imminent or life-threatening deterioration.  Critical care was time spent personally by me on the following activities: development of treatment plan with patient and/or surrogate as well as nursing, discussions with consultants, evaluation of patient's response to treatment, examination of patient, obtaining history from patient or surrogate, ordering and performing treatments and interventions, ordering and review of laboratory studies, ordering and review of radiographic studies, pulse oximetry and re-evaluation of patient's condition.   Pt's breathing has improved, but he is still tachycardic.  Tachycardia has improved some with IVFs, but she is still tachycardic.  Pt placed on bipap and is improving.  Code sepsis called and pt given iv zithromax (rocephin given earlier today).  Pt given 3L IVFs.  Pt d/w Dr. Lorin Mercy (triad) for admission.  This patient meets SIRS Criteria and may be septic. SIRS = Systemic Inflammatory Response Syndrome  Best Practice Recommends:   Notify the nurse immediately to increase monitoring of patient.   The recent clinical data is shown below. Vitals:   09/02/17 1526 09/02/17 1540 09/02/17 1545 09/02/17 1600  BP:  96/73 100/72 103/80  Pulse: (!) 166 (!) 161 (!) 162 78  Resp: (!) 26 (!) 25 (!) 29 (!) 21  Temp:      TempSrc:      SpO2: 94% 93% 94% 98%  Weight:      Height:        Final Clinical Impressions(s) / ED Diagnoses   Final diagnoses:  Community acquired pneumonia of left lower lobe of lung ([place])  Sepsis, due to unspecified organism [facility])  Sinus tachycardia    ED Discharge Orders    None       Isla Pence, MD 09/02/17 1621

## 2017-09-02 NOTE — Progress Notes (Signed)
  Amiodarone Drug - Drug Interaction Consult Note  Amiodarone and fluoxetine may result an increased QT prolongation and risk of cardiotoxicity. Also, simvastatin and amiodarone may result in increased risk for rhabdo.  Recommendations: Consider monitoring EKG, electrolytes and muscle pain   Amiodarone is metabolized by the cytochrome P450 system and therefore has the potential to cause many drug interactions. Amiodarone has an average plasma half-life of 50 days (range 20 to 100 days).   There is potential for drug interactions to occur several weeks or months after stopping treatment and the onset of drug interactions may be slow after initiating amiodarone.   [x]  Statins: Increased risk of myopathy. Simvastatin- restrict dose to 20mg  daily. Other statins: counsel patients to report any muscle pain or weakness immediately.  []  Anticoagulants: Amiodarone can increase anticoagulant effect. Consider warfarin dose reduction. Patients should be monitored closely and the dose of anticoagulant altered accordingly, remembering that amiodarone levels take several weeks to stabilize.  []  Antiepileptics: Amiodarone can increase plasma concentration of phenytoin, the dose should be reduced. Note that small changes in phenytoin dose can result in large changes in levels. Monitor patient and counsel on signs of toxicity.  []  Beta blockers: increased risk of bradycardia, AV block and myocardial depression. Sotalol - avoid concomitant use.  []   Calcium channel blockers (diltiazem and verapamil): increased risk of bradycardia, AV block and myocardial depression.  []   Cyclosporine: Amiodarone increases levels of cyclosporine. Reduced dose of cyclosporine is recommended.  []  Digoxin dose should be halved when amiodarone is started.  []  Diuretics: increased risk of cardiotoxicity if hypokalemia occurs.  []  Oral hypoglycemic agents (glyburide, glipizide, glimepiride): increased risk of hypoglycemia.  Patient's glucose levels should be monitored closely when initiating amiodarone therapy.   []  Drugs that prolong the QT interval:  Torsades de pointes risk may be increased with concurrent use - avoid if possible.  Monitor QTc, also keep magnesium/potassium WNL if concurrent therapy can't be avoided. [name] Antibiotics: e.g. fluoroquinolones, erythromycin. . Antiarrhythmics: e.g. quinidine, procainamide, disopyramide, sotalol. . Antipsychotics: e.g. phenothiazines, haloperidol.  . Lithium, tricyclic antidepressants, and methadone. Thank Mable Fill  09/02/2017 7:48 PM

## 2017-09-02 NOTE — ED Triage Notes (Signed)
Pt seen at Dr. Joylene Draft office this morning for blood in urine x 2 days, worsening in breathing over past 3 weeks, is finishing a pred tapor, saw pulmonary yesterday and doctor thought pt sounded better.  Pt reports his breathing is not improving.  C/o lower abd pain.  + for UTI in clinic today, urine culture was sent off.  BP 96/62, c/o nausea, states vomited this morning while trying to take medication, diarrhea x 1 today.  Rocephin IM given in office today.

## 2017-09-02 NOTE — ED Notes (Signed)
Cardiology paged regarding pt HR

## 2017-09-02 NOTE — ED Notes (Signed)
Pt requesting ice pack to place on sore neck; pt given ice pack

## 2017-09-02 NOTE — Progress Notes (Signed)
RT NOTE:  Pt transported to 2C06 without event. Report given to [place], RRT.

## 2017-09-02 NOTE — ED Notes (Signed)
cardiology at bedside and ordered 12mg  of Adenosine IV due to patients heart rate in 160's. Pt was placed on Zoll, pt is still on Bipap. 12 mg adenosine was pushed with Hannie RN and Cardiologist at beside. Pt's rate went into what looked like Flutter Zoll monitoring was captured. Pt's heart rate then returned to 160's. Pt tolerated well.

## 2017-09-02 NOTE — H&P (Signed)
History and Physical    Travis Ferguson YTK:[id number] DOB: [birth year] DOA: 09/02/2017  PCP: Crist Infante, MD Consultants:  Halford Chessman - pulmonology; Nelva Bush - pain management; Mechele Dawley - cardiology Patient coming from:  Home - lives with wife; NOK: wife, [phone]; [phone]  Chief Complaint: SOB  HPI: Travis Ferguson is a 77 y.o. 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   Patient started with this episode about 6 weeks ago.  He noticed pressure in his chest, feeling like his chest was pushing against his lungs. When he gets out of a chair, the pressures rises in his chest.  He was unable to walk from the driveway to the back of his house without stopping.  He was unable to walk in stores, would have to sit down a lot.  He has h/o PNA in the past.  No significant cough.  No significant wheezing.  He is generally weak overall and has chronic pain.  No fever, although he has had chills.   ED Course: Pneumonia, sepsis.  SOB, saw pulm yesterday and was told to wear O2 more (usually wears qhs).  PCP gave Rocephin IM and sent him in.  On arrival extremely SOB, tachypnea, dense consolidated LLL PNA.  Started on BIPAP due to persistent tachycardia, some improvement with IVF.  Borderline hypotension.  Will give entire sepsis bolus , 3L.  Review of Systems: As per HPI; otherwise review of systems reviewed and negative.   Ambulatory Status:  Ambulates with a cane  Past Medical History:  Diagnosis Date  . Acute on chronic combined systolic and diastolic CHF (congestive heart failure) ([place]) 02/24/2015  . Anxiety   . Arthritis    "about q joint I've got" (09/22/2015)  . Atherosclerosis   . CAP (community acquired pneumonia) 02/24/2015  . Cervical disc disease   . Chronic lower back pain    chronic back pain/under pain management  . Colon polyps 08/02/2014   Tubular adenoma  x 3, Hyperplastic-1  . COPD (chronic obstructive pulmonary disease) ([place])    a. Former Airline pilot, also smoked a pipe.  . Coronary artery calcification seen on CAT scan   . Depression   . Dyslipidemia   . Dyspnea    chronic  . GERD (gastroesophageal reflux disease)   . Hiatal hernia    sensation problem ("right lateral side hip to knee").  . History of blood transfusion "numerous"  . History of oxygen administration    @ 2 l/m nasally at bedtime.(09/22/2015)  . Hypertension   . Inguinal hernia    right side  . Migraine    "years since I've had one" (09/22/2015)  . OSA (obstructive sleep apnea)    does not use CPAP; "just oxygen" (09/22/2015)  . PTSD (post-traumatic stress disorder)   . Rhinitis   . Sarcoidosis   . Sensation problem    right side-lateral hip to knee" decreased sensation and tingling feeling" -has informed Dr. Joylene Draft, Dr. Henrene Pastor, Dr. Lucia Gaskins of this.  . Sepsis ([place])   . Steroid-induced hyperglycemia 02/25/2015  . Subdural hematoma ([place]) 11/10   a. 2010 s/p surgery - diagnosed several weeks after a fall.    Past Surgical History:  Procedure Laterality Date  . BACK SURGERY    . BRAIN SURGERY  04/2009   right frontal"hemorrhage evacuation from fall injury"  . CARDIAC CATHETERIZATION N/A 01/23/2015   Procedure: Right/Left  Heart Cath and Coronary Angiography;  Surgeon: Lorretta Harp, MD;  Location: [facility];  Service: Cardiovascular;  Laterality: N/A;  . CHOLECYSTECTOMY N/A 11/08/2014   Procedure: LAPAROSCOPIC CHOLECYSTECTOMY ;  Surgeon: Alphonsa Overall, MD;  Location: [facility];  Service: General;  Laterality: N/A;  . ERCP N/A 11/07/2014   Procedure: ENDOSCOPIC RETROGRADE CHOLANGIOPANCREATOGRAPHY (ERCP);  Surgeon: Irene Shipper, MD;  Location: [facility];  Service: Endoscopy;  Laterality: N/A;  . GALLBLADDER SURGERY  09/2014   drain tube placed  . INGUINAL HERNIA REPAIR Left   . KNEE ARTHROSCOPY Right   . LUMBAR LAMINECTOMY/DECOMPRESSION MICRODISCECTOMY Right  12/15/2012   Procedure: LUMBAR LAMINECTOMY/DECOMPRESSION MICRODISCECTOMY 1 LEVEL;  Surgeon: Elaina Hoops, MD;  Location: [facility];  Service: Neurosurgery;  Laterality: Right;  Right Lumbar four-five laminectomy/foraminotomy  . REVERSE SHOULDER ARTHROPLASTY Left 09/22/2015   Procedure: LEFT REVERSE TOTAL SHOULDER ARTHROPLASTY;  Surgeon: Netta Cedars, MD;  Location: [facility];  Service: Orthopedics;  Laterality: Left;  . REVERSE TOTAL SHOULDER ARTHROPLASTY Left 09/22/2015  . VIDEO BRONCHOSCOPY  07/28/2012   Procedure: VIDEO BRONCHOSCOPY WITH FLUORO;  Surgeon: Chesley Mires, MD;  Location: [facility];  Service: Cardiopulmonary;  Laterality: Bilateral;    Social History   Socioeconomic History  . Marital status: Married    Spouse name: Not on file  . Number of children: 2  . Years of education: 4  . Highest education level: Not on file  Social Needs  . Financial resource strain: Not on file  . Food insecurity - worry: Not on file  . Food insecurity - inability: Not on file  . Transportation needs - medical: Not on file  . Transportation needs - non-medical: Not on file  Occupational History  . 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 Smoking status: Former Smoker    Packs/day: 0.00    Years: 30.00    Pack years: 0.00    Types: Pipe    Last attempt to quit: 06/24/1998    Years since quitting: 19.2  . Smokeless tobacco: Never Used  Substance and Sexual Activity  . Alcohol use: No  . Drug use: No  . Sexual activity: Not Currently  Other Topics Concern  . Not on file  Social History Narrative   Drinks about 6-7 caffeine drinks a day     Allergies  Allergen Reactions  . Statins Other (See Comments)    Muscle aches - tolerating Vytorin    Family History  Problem Relation Age of Onset  . Heart disease Mother   . Heart disease Father   . Colon polyps Brother   . Congestive Heart Failure Brother   . Coronary artery disease Unknown   . Stomach cancer Paternal  Grandmother   . Colon cancer Neg Hx   . Pancreatic cancer Neg Hx   . Rectal cancer Neg Hx   . Esophageal cancer Neg Hx     Prior to Admission medications   Medication Sig Start Date End Date Taking? Authorizing Provider  albuterol (PROAIR HFA) 108 (90 Base) MCG/ACT inhaler 1-2 PUFFS EVERY 4-6 HOURS AS NEEDED for shortness of breath 08/08/17  Yes Chesley Mires, MD  budesonide-formoterol (SYMBICORT) 160-4.5 MCG/ACT inhaler Inhale 2 puffs into the lungs 2 (two) times daily. 07/15/17  Yes Parrett, Tammy S, NP  DULoxetine (CYMBALTA) 30 MG capsule Take 30 mg by mouth daily.   Yes [provider]  ENTRESTO 24-26 MG TAKE 1 TABLET BY MOUTH 2 (TWO) TIMES DAILY. 05/14/16  Yes  Lorretta Harp, MD  finasteride (PROSCAR) 5 MG tablet Take 5 mg by mouth daily.  01/07/13  Yes [provider]  FLUoxetine (PROZAC) 40 MG capsule Take 40 mg by mouth daily with breakfast.    Yes [provider]  furosemide (LASIX) 20 MG tablet Take 1 tablet (20 mg total) by mouth daily. 05/12/15  Yes Lorretta Harp, MD  HYDROcodone-acetaminophen (NORCO/VICODIN) 5-325 MG tablet Take 1 tablet by mouth every 8 (eight) hours as needed for moderate pain.    Yes [provider]  ipratropium (ATROVENT) 0.02 % nebulizer solution Take 2.5 mLs (0.5 mg total) by nebulization 4 (four) times daily. 07/15/17  Yes Parrett, Tammy S, NP  metoprolol tartrate (LOPRESSOR) 25 MG tablet TAKE 1 TABLET (25 MG TOTAL) BY MOUTH 2 (TWO) TIMES DAILY. 09/11/15  Yes Lorretta Harp, MD  Multiple Vitamin (MULTIVITAMIN) tablet Take 1 tablet by mouth daily.   Yes [provider]  predniSONE (DELTASONE) 10 MG tablet 3 pills daily for 3 days, 2 pills daily for 3 days, then 1 pill daily 08/08/17  Yes Chesley Mires, MD  simvastatin (ZOCOR) 20 MG tablet Take 20 mg by mouth every evening.  04/10/14  Yes [provider]  sodium chloride (OCEAN) 0.65 % SOLN nasal spray Place 1-2 sprays into both nostrils daily as needed  for congestion.   Yes [provider]  spironolactone (ALDACTONE) 25 MG tablet Take 1 tablet (25 mg total) by mouth daily. 02/27/15  Yes Rama, Venetia Maxon, MD  budesonide-formoterol (SYMBICORT) 160-4.5 MCG/ACT inhaler Inhale 2 puffs into the lungs 2 (two) times daily. Patient not taking: Reported on 09/02/2017 08/08/17   Chesley Mires, MD  budesonide-formoterol [facility]) 160-4.5 MCG/ACT inhaler Inhale 2 puffs into the lungs 2 (two) times daily. Patient not taking: Reported on 09/02/2017 09/01/17   Chesley Mires, MD  OXYGEN Inhale 2 L/min into the lungs at bedtime.    [provider]  SYMBICORT 160-4.5 MCG/ACT inhaler INHALE 2 PUFFS INTO THE LUNGS 2 (TWO) TIMES DAILY. Patient not taking: Reported on 09/02/2017 11/06/16   Chesley Mires, MD    Physical Exam: Vitals:   09/02/17 1526 09/02/17 1540 09/02/17 1545 09/02/17 1600  BP:  96/73 100/72 103/80  Pulse: (!) 166 (!) 161 (!) 162 78  Resp: (!) 26 (!) 25 (!) 29 (!) 21  Temp:      TempSrc:      SpO2: 94% 93% 94% 98%  Weight:      Height:         General: Appears ill Eyes:  PERRL, EOMI, normal lids, iris ENT:  grossly normal hearing, lips & tongue; on BIPAP Neck:  no LAD, masses or thyromegaly Cardiovascular: Marked tachycardia to 165 throughout encounter - this is after 2L bolus. No LE edema.  Respiratory:   CTA bilaterally but limited exam due to BIPAP.   Abdomen:  soft, NT, ND, NABS Skin:  no rash or induration seen on limited exam Musculoskeletal:  grossly normal tone BUE/BLE, good ROM, no bony abnormality Lower extremity:  No LE edema.  Limited foot exam with no ulcerations.  2+ distal pulses. Psychiatric:  grossly normal mood and affect, speech fluent and appropriate, AOx3 Neurologic:  CN 2-12 grossly intact, moves all extremities in coordinated fashion, sensation intact    Radiological Exams on Admission: Ct Abdomen Pelvis W Contrast  Result Date: 09/02/2017 CLINICAL DATA:  Hematuria for 2 days. Diagnosed with  urinary tract infection today. Hypotensive. On prednisone taper for breathing difficulties. History of CHF,  cholecystectomy, hypertension, sarcoidosis. EXAM: CT ABDOMEN AND PELVIS WITH CONTRAST TECHNIQUE: Multidetector CT imaging of the abdomen and pelvis was performed using the standard protocol following bolus administration of intravenous contrast. CONTRAST:  57mL ISOVUE-300 IOPAMIDOL (ISOVUE-300) INJECTION 61% COMPARISON:  CT abdomen and pelvis December 28, 2014 inch chest radiograph September 02, 2017 FINDINGS: LOWER CHEST: Small LEFT pleural effusion with hepatization LEFT lower lobe. Associated centrilobular ground-glass nodules. Included heart size is normal. No pericardial effusion. Mild coronary artery calcifications. HEPATOBILIARY: Status post cholecystectomy.  Negative CT liver. PANCREAS: Normal. SPLEEN: Normal. ADRENALS/URINARY TRACT: Kidneys are orthotopic, demonstrating symmetric enhancement. No nephrolithiasis, hydronephrosis or solid renal masses. 2.4 cm homogeneously hypodense benign-appearing cyst LEFT interpolar kidney. Too small to characterize hypodensity upper pole RIGHT kidney. The unopacified ureters are normal in course and caliber. Delayed imaging through the kidneys demonstrates symmetric prompt contrast excretion within the proximal urinary collecting system. Urinary bladder is decompressed and unremarkable. Normal adrenal glands. STOMACH/BOWEL: The stomach, small and large bowel are normal in course and caliber without inflammatory changes. Small debris-filled duodenal diverticulum. Moderate colonic diverticulosis. Normal appendix. VASCULAR/LYMPHATIC: Aortoiliac vessels are normal in course and caliber. Moderate calcific atherosclerosis. No lymphadenopathy by CT size criteria. REPRODUCTIVE: Normal. OTHER: No intraperitoneal free fluid or free air. New RIGHT lower quadrant fat stranding associated with large RIGHT fat containing inguinal hernia. Moderate LEFT fat containing inguinal hernia,  status post herniorrhaphy. Focally thickened RIGHT pericolic gutter with calcification of prior site of inflammatory process. Small fat containing paraumbilical hernias. MUSCULOSKELETAL: Nonacute. Severe L4-5 and L5-S1 degenerative discs. Severe LEFT L4-5 and L5-S1 neural foraminal narrowing. IMPRESSION: 1. Large LEFT lower lobe consolidation consistent with pneumonia. Small LEFT pleural effusion. 2. Focal fat necrosis RIGHT pelvis associated with large fat containing RIGHT inguinal hernia. 3. No nephrolithiasis, hydronephrosis or pyelonephritis. Aortic Atherosclerosis (ICD10-I70.0). Electronically Signed   By: Elon Alas M.D.   On: 09/02/2017 15:42   Dg Chest Port 1 View  Result Date: 09/02/2017 CLINICAL DATA:  Chest tightness, shortness of breath and nausea over the last 2 weeks. EXAM: PORTABLE CHEST 1 VIEW COMPARISON:  08/08/2017 FINDINGS: The patient is developed a left effusion with left lower lobe atelectasis and likely pneumonia. Minimal patchy density at the right lung base. Aortic atherosclerosis again noted. Upper lungs are clear. IMPRESSION: Development of left effusion and left lower lobe volume loss/pneumonia. Electronically Signed   By: Nelson Chimes M.D.   On: 09/02/2017 15:01    EKG: Independently reviewed.  Undetermined rhythm with rate 160; nonspecific ST changes  - likely rate related   Labs on Admission: I have personally reviewed the available labs and imaging studies at the time of the admission.  Pertinent labs:   Lactate 2.73 Blood cultures pending Glucose 171 BUN 26/Creatinine 1.45/GFR 45; 16/0.98/79 on 2/15 Albumin 2.6 BNP 184.2 Troponin 0.01 WBC 21.0 Platelets 494   Assessment/Plan Principal Problem:   Left lower lobe pneumonia (HCC) Active Problems:   Obstructive sleep apnea   COPD with chronic bronchitis (HCC)   Pulmonary sarcoidosis (HCC)   Essential hypertension   Hyperlipidemia   Acute on chronic respiratory failure with hypoxia (HCC)   Sepsis  (HCC)   Tachycardia   Chronic pain syndrome   Sepsis associated with LLL PNA with chronic respiratory failure associated with sarcoidosis, COPD, and OSA -Elevated WBC count, tachycardia, tachypnea with elevated lactate and borderline hypotension -While awaiting blood cultures, this appears to be a preseptic condition. -Sepsis protocol initiated; has not yet received all of IVF bolus but after 2L IVF he  continues to have marked tachycardia -Has marked LLL PNA on CXR with small surrounding effusion  -CURB-65 score is 3, meaning that the patient has a 14.5% risk of death and ICU care should be considered; will admit to SDU for now. -Pneumonia Severity Index (PSI) is Class 4, 9.3% mortality. -Corticosteroids have been to shown to low overall mortality rate; risk of ARDS; and need for mechanical ventilation.  This is particularly true in severe PNA (class 3+ PSI).  Will add 60 mg IV Solumedrol BID for now. -The patient will need treatment for HCAP due to severe sepsis without clear shock physiology; will treat with Cefepime and Vancomycin. -Additional complicating factors include: hypoxia/hypoxemia; persistent tachypnea; associated pleural effusions; and persistent marked tachycardia. -NS @ 75cc/hr -Fever control -Repeat CBC in am -Sputum cultures -Blood cultures -Strep pneumo testing -Will order lower respiratory tract procalcitonin level.  Antibiotics would not be indicated for PCT <0.1 and probably should not be used for < 0.25.  >0.5 indicates infection and >>0.5 indicates more serious disease.  As the procalcitonin level normalizes, it will be reasonable to consider de-escalation of antibiotic coverage. -Blood and urine cultures pending -Will admit to SDU with telemetry and continue to monitor -Will trend lactate to ensure improvement -Patient was seen in pulmonary clinic yesterday and so pulmonary was consulted to again advise.  At that time, he was thought to have worsening COPD, less  likely sarcoidosis. -He does have an effusion but it does not appear to be large enough to require thoracentesis  Tachycardia -patient with marked persistent tachycardia. -He is currently at 164 despite 2-3L IVF -Review of EKG shows "unknown rhythm" - I am concerned about SVT -He could also have refractory sinus tachycardia associated with his illness, but one would expect improvement in his heart rate with IVF - and this has not happened -He had borderline hypotension on presentation and is currently 130/100 - the hypotension has improved but he has a narrow pulse pressure -I do not hear a rub that would be concerning for a pericardial effusion -Will request cardiology consult and have spoken with [employer] to ensure that patient will be seen soon -He does have some fat necrosis within   Chronic combined heart failure -Appears to be compensated at this time, but he will need to be monitored since he has received a large amount of IVF -Continue Entresto  HTN -Hold Lopressor based on borderline hypotension on arrival but resume as soon as appropriate  HLD -Continue Zocor  Chronic pain syndrome -I have reviewed this patient in the [place] Controlled Substances Reporting System.  He is receiving medications from only one provider (at a time) and appears to be taking them as prescribed. -He is not at particularly high risk of opioid misuse, diversion, or overdose. -Check UDS -Continue home medications vs. Morphine prn severe pain     DVT prophylaxis:  Lovenox  Code Status: Full  Family Communication: None present Disposition Plan:   To be determined Consults called: Pulmonary, Cardiology Admission status: Admit - It is my clinical opinion that admission to INPATIENT is reasonable and necessary because of the expectation that this patient will require hospital care that crosses at least 2 midnights to treat this condition based on the medical complexity of the problems presented.  Given  the aforementioned information, the predictability of an adverse outcome is felt to be significant.    Karmen Bongo MD [facility]  If note is complete, please contact covering daytime or nighttime physician. [email or web address] Password [email or web address]  09/02/2017, 4:55 PM

## 2017-09-02 NOTE — Consult Note (Signed)
Name: Travis Ferguson MRN: [id number] DOB: 1940/07/09    ADMISSION DATE:  09/02/2017 CONSULTATION DATE:  09/02/2017  REFERRING MD :  Dr. Lorin Mercy  CHIEF COMPLAINT:  SOB, hematuria, abd pain  HISTORY OF PRESENT ILLNESS:   77 year old male with PMH as below significant for but not limited to former smoker, COPD, OSA on O2 hs, sarcoidosis, and combined HF who presented with worsening shortness of breath and hematuria.  He is followed by Dr. Halford Chessman, last seen yesterday/3/11 in the office where he stated he was still having SOB at rest and with exertion, wheezing, chest tightness, feels as he is suffocating at night and with progressive decline in his activity level.  His respiratory symptoms recently improved with a course of prednisone, thought symptoms more related to COPD rather than sarcoidosis.  He was able to maintain his SpO2 on room air with exertion in office.  Recommendations made for continued steroid taper, and repeat sleep study.  He followed up with his PCP today for worsening symptoms and development of chills, nausea, vomiting, and diarrhea x1.  He was treated for a UTI with rocephin IM.  He denies any recent sick contacts, palpitations, fever, change in sputum production.    In the ER, he was borderline hypotensive at 96/62, afebrile, tachypneic and found to be tachycardic in the 160's with room air sats of 96% .  CXR showing left lower lobe pneumonia with effusion which was also noted on abdominal CT.  Labs notable for WBC 21, BNP 184, lactic acid 2.73 -> 1.43, sCr 1.45 (baseline 0.85), UA here without nitrates or leukocytes.  He was given 3L NS bolus and started on vancomycin, ceftriaxone and azithromycin.  He was placed on BiPAP for increased work of breathing.  Cardiology was also consulted reference to persistent tachycardia despite afebrile and fluid bolus; adenosine was given revealing atrial flutter.  Patient admitted to SDU by [facility] with PCCM asked to consult for pulmonary.    PAST  MEDICAL HISTORY :   has a past medical history of Acute on chronic combined systolic and diastolic CHF (congestive heart failure) ([place]) (02/24/2015), Anxiety, Arthritis, Atherosclerosis, CAP (community acquired pneumonia) (02/24/2015), Cervical disc disease, Chronic lower back pain, Colon polyps (08/02/2014), COPD (chronic obstructive pulmonary disease) ([place]), Coronary artery calcification seen on CAT scan, Depression, Dyslipidemia, Dyspnea, GERD (gastroesophageal reflux disease), Hiatal hernia, History of blood transfusion ("numerous"), History of oxygen administration, Hypertension, Inguinal hernia, Migraine, OSA (obstructive sleep apnea), PTSD (post-traumatic stress disorder), Rhinitis, Sarcoidosis, Sensation problem, Sepsis ([place]), Steroid-induced hyperglycemia (02/25/2015), and Subdural hematoma ([place]) (11/10).  has a past surgical history that includes Video bronchoscopy (07/28/2012); Knee arthroscopy (Right); Lumbar laminectomy/decompression microdiscectomy (Right, 12/15/2012); Gallbladder surgery (09/2014); ERCP (N/A, 11/07/2014); Cholecystectomy (N/A, 11/08/2014); Cardiac catheterization (N/A, 01/23/2015); Reverse total shoulder arthroplasty (Left, 09/22/2015); Inguinal hernia repair (Left); Back surgery; Brain surgery (04/2009); and Reverse shoulder arthroplasty (Left, 09/22/2015). Prior to Admission medications   Medication Sig Start Date End Date Taking? Authorizing Provider  albuterol (PROAIR HFA) 108 (90 Base) MCG/ACT inhaler 1-2 PUFFS EVERY 4-6 HOURS AS NEEDED for shortness of breath 08/08/17  Yes Chesley Mires, MD  budesonide-formoterol (SYMBICORT) 160-4.5 MCG/ACT inhaler Inhale 2 puffs into the lungs 2 (two) times daily. 07/15/17  Yes Parrett, Tammy S, NP  DULoxetine (CYMBALTA) 30 MG capsule Take 30 mg by mouth daily.   Yes [provider]  ENTRESTO 24-26 MG TAKE 1 TABLET BY MOUTH 2 (TWO) TIMES DAILY. 05/14/16  Yes Lorretta Harp, MD  finasteride (PROSCAR) 5 MG  tablet Take 5 mg by mouth daily.   01/07/13  Yes [provider]  FLUoxetine (PROZAC) 40 MG capsule Take 40 mg by mouth daily with breakfast.    Yes [provider]  furosemide (LASIX) 20 MG tablet Take 1 tablet (20 mg total) by mouth daily. 05/12/15  Yes Lorretta Harp, MD  HYDROcodone-acetaminophen (NORCO/VICODIN) 5-325 MG tablet Take 1 tablet by mouth every 8 (eight) hours as needed for moderate pain.    Yes [provider]  ipratropium (ATROVENT) 0.02 % nebulizer solution Take 2.5 mLs (0.5 mg total) by nebulization 4 (four) times daily. 07/15/17  Yes Parrett, Tammy S, NP  metoprolol tartrate (LOPRESSOR) 25 MG tablet TAKE 1 TABLET (25 MG TOTAL) BY MOUTH 2 (TWO) TIMES DAILY. 09/11/15  Yes Lorretta Harp, MD  Multiple Vitamin (MULTIVITAMIN) tablet Take 1 tablet by mouth daily.   Yes [provider]  predniSONE (DELTASONE) 10 MG tablet 3 pills daily for 3 days, 2 pills daily for 3 days, then 1 pill daily 08/08/17  Yes Chesley Mires, MD  simvastatin (ZOCOR) 20 MG tablet Take 20 mg by mouth every evening.  04/10/14  Yes [provider]  sodium chloride (OCEAN) 0.65 % SOLN nasal spray Place 1-2 sprays into both nostrils daily as needed for congestion.   Yes [provider]  spironolactone (ALDACTONE) 25 MG tablet Take 1 tablet (25 mg total) by mouth daily. 02/27/15  Yes Rama, Venetia Maxon, MD  OXYGEN Inhale 2 L/min into the lungs at bedtime.    [provider]  SYMBICORT 160-4.5 MCG/ACT inhaler INHALE 2 PUFFS INTO THE LUNGS 2 (TWO) TIMES DAILY. Patient not taking: Reported on 09/02/2017 11/06/16   Chesley Mires, MD   Allergies  Allergen Reactions  . Statins Other (See Comments)    Muscle aches - tolerating Vytorin    FAMILY HISTORY:  family history includes Colon polyps in his brother; Congestive Heart Failure in his brother; Coronary artery disease in his unknown relative; Heart disease in his father and mother; Stomach cancer in his paternal grandmother. SOCIAL HISTORY:   reports that he quit smoking about 19 years ago. His smoking use included pipe. He smoked 0.00 packs per day for 30.00 years. he has never used smokeless tobacco. He reports that he does not drink alcohol or use drugs.  REVIEW OF SYSTEMS:  POSITIVES in BOLD  Constitutional: Negative for fever, chills, weight loss, malaise/fatigue and diaphoresis.  HENT: Negative for congestion, sore throat, Respiratory: Negative for change in cough, hemoptysis, sputum production, shortness of breath, wheezing and stridor.   Cardiovascular: Negative for chest tightness, palpitations, leg swelling and PND.  Gastrointestinal: Negative for heartburn, nausea, vomiting, lower abdominal pain, diarrhea, constipation, blood in stool.  Genitourinary: Negative for dysuria, urgency, frequency, hematuria and flank pain.  Musculoskeletal: Negative for myalgias, back pain Neurological: Negative for dizziness, speech change, focal weakness, loss of consciousness, and headaches.   SUBJECTIVE:   VITAL SIGNS: Temp:  [97.7 F (36.5 C)] 97.7 F (36.5 C) (03/12 1256) Pulse Rate:  [55-171] 139 (03/12 1734) Resp:  [21-29] 27 (03/12 1734) BP: (87-108)/(67-90) 94/73 (03/12 1734) SpO2:  [93 %-100 %] 98 % (03/12 1734) Weight:  [259 lb (117.5 kg)] 259 lb (117.5 kg) (03/12 1257)  PHYSICAL EXAMINATION: General:  Older adult male sitting in ER stretcher on NIPPV in NAD HEENT:  Pupils 4/equal, NIPPV mask in place Neuro: Alert, oriented, MAE, non-focal CV:  RR- rate 160's PULM: even/non-labored on 10/5 (TV 400-650), slight wheeze in RUL otherwise diminished,  severely diminished in L base, able to speak in short sentences on bipap without difficulty  GI: obese, sofft, non-tender, bs active  Extremities: warm/dry, no edema  Skin: no rashes or lesions   Recent Labs  Lab 09/02/17 1315  NA 132*  K 4.5  CL 100*  CO2 18*  BUN 26*  CREATININE 1.45*  GLUCOSE 171*   Recent Labs  Lab 09/02/17 1315  HGB 15.3  HCT 45.1  WBC  21.0*  PLT 494*   Ct Abdomen Pelvis W Contrast  Result Date: 09/02/2017 CLINICAL DATA:  Hematuria for 2 days. Diagnosed with urinary tract infection today. Hypotensive. On prednisone taper for breathing difficulties. History of CHF, cholecystectomy, hypertension, sarcoidosis. EXAM: CT ABDOMEN AND PELVIS WITH CONTRAST TECHNIQUE: Multidetector CT imaging of the abdomen and pelvis was performed using the standard protocol following bolus administration of intravenous contrast. CONTRAST:  35mL ISOVUE-300 IOPAMIDOL (ISOVUE-300) INJECTION 61% COMPARISON:  CT abdomen and pelvis December 28, 2014 inch chest radiograph September 02, 2017 FINDINGS: LOWER CHEST: Small LEFT pleural effusion with hepatization LEFT lower lobe. Associated centrilobular ground-glass nodules. Included heart size is normal. No pericardial effusion. Mild coronary artery calcifications. HEPATOBILIARY: Status post cholecystectomy.  Negative CT liver. PANCREAS: Normal. SPLEEN: Normal. ADRENALS/URINARY TRACT: Kidneys are orthotopic, demonstrating symmetric enhancement. No nephrolithiasis, hydronephrosis or solid renal masses. 2.4 cm homogeneously hypodense benign-appearing cyst LEFT interpolar kidney. Too small to characterize hypodensity upper pole RIGHT kidney. The unopacified ureters are normal in course and caliber. Delayed imaging through the kidneys demonstrates symmetric prompt contrast excretion within the proximal urinary collecting system. Urinary bladder is decompressed and unremarkable. Normal adrenal glands. STOMACH/BOWEL: The stomach, small and large bowel are normal in course and caliber without inflammatory changes. Small debris-filled duodenal diverticulum. Moderate colonic diverticulosis. Normal appendix. VASCULAR/LYMPHATIC: Aortoiliac vessels are normal in course and caliber. Moderate calcific atherosclerosis. No lymphadenopathy by CT size criteria. REPRODUCTIVE: Normal. OTHER: No intraperitoneal free fluid or free air. New RIGHT lower  quadrant fat stranding associated with large RIGHT fat containing inguinal hernia. Moderate LEFT fat containing inguinal hernia, status post herniorrhaphy. Focally thickened RIGHT pericolic gutter with calcification of prior site of inflammatory process. Small fat containing paraumbilical hernias. MUSCULOSKELETAL: Nonacute. Severe L4-5 and L5-S1 degenerative discs. Severe LEFT L4-5 and L5-S1 neural foraminal narrowing. IMPRESSION: 1. Large LEFT lower lobe consolidation consistent with pneumonia. Small LEFT pleural effusion. 2. Focal fat necrosis RIGHT pelvis associated with large fat containing RIGHT inguinal hernia. 3. No nephrolithiasis, hydronephrosis or pyelonephritis. Aortic Atherosclerosis (ICD10-I70.0). Electronically Signed   By: Elon Alas M.D.   On: 09/02/2017 15:42   Dg Chest Port 1 View  Result Date: 09/02/2017 CLINICAL DATA:  Chest tightness, shortness of breath and nausea over the last 2 weeks. EXAM: PORTABLE CHEST 1 VIEW COMPARISON:  08/08/2017 FINDINGS: The patient is developed a left effusion with left lower lobe atelectasis and likely pneumonia. Minimal patchy density at the right lung base. Aortic atherosclerosis again noted. Upper lungs are clear. IMPRESSION: Development of left effusion and left lower lobe volume loss/pneumonia. Electronically Signed   By: Nelson Chimes M.D.   On: 09/02/2017 15:01   SIGNIFICANT EVENTS  3/12  Admit to SDU by [facility]  STUDIES:  3/12 CT abd >>  1. Large LEFT lower lobe consolidation consistent with pneumonia. Small LEFT pleural effusion. 2. Focal fat necrosis RIGHT pelvis associated with large fat containing RIGHT inguinal hernia. 3. No nephrolithiasis, hydronephrosis or pyelonephritis.  CULTURES: 3/12 BCx2 >> 3/12 UC >>  ANTIBIOTICS:  3/12 ceftriaxone IM; IV >>  3/2 azithromycin  3/12 vancomycin >>  BRIEF PATIENT DESCRIPTION:  89 yoM w/PMH of COPD, OSA, hx sarcoid presenting with hematuria x 2 days and progressive SOB.  Found to have  LLL pna, SIRS, borderline hypotension improved with IVFs, remained tachycardic- cardiology evaluating.  Placed on NIPPV for increased WOB   ASSESSMENT / PLAN:  LLL PNA  Acute on chronic hypoxic respiratory failure  Left pleural Effusion- new COPD, resolving exacerbation OSA  Hx Sarcodis P:  Continue NIPPV for now given ongoing tachycardia and tachypnea with Caution given prior N/V earlier in the day.  Denies nausea at present.  Patient does not feel that NIPPV has overwhelmingly helped his SOB at this point and does not appear to be in distress Wean FiO2 for sats > 92% Continue solumedrol 60 mg BID  BD duonebs q4 hrs and prn albuterol  Budesonide BID  Strep urine pna pending Sputum cx pending PCT pending D/c azithro and ceftriaxone  Continue with vanc and zosyn and narrow as able/ follow cx Will discuss with attending reference to ?diagnositic Thoracentesis for effusion- but does not appear large enough at this point. Continue with outpatient OSA plans for sleep evaluation as planned   Remainder per Primary team:   SIRS Tachycardia- unclear underlying rhythm (? Rapid afib/flutter) after adenosine UTI  Kennieth Rad, AGACNP-BC [facility] Pgr: [phone] or if no answer [phone] 09/02/2017, 7:31 PM

## 2017-09-03 ENCOUNTER — Encounter (HOSPITAL_COMMUNITY): Payer: Self-pay | Admitting: *Deleted

## 2017-09-03 DIAGNOSIS — J9611 Chronic respiratory failure with hypoxia: Secondary | ICD-10-CM

## 2017-09-03 DIAGNOSIS — E7800 Pure hypercholesterolemia, unspecified: Secondary | ICD-10-CM

## 2017-09-03 DIAGNOSIS — J189 Pneumonia, unspecified organism: Secondary | ICD-10-CM

## 2017-09-03 DIAGNOSIS — J90 Pleural effusion, not elsewhere classified: Secondary | ICD-10-CM

## 2017-09-03 DIAGNOSIS — I5042 Chronic combined systolic (congestive) and diastolic (congestive) heart failure: Secondary | ICD-10-CM

## 2017-09-03 LAB — RAPID URINE DRUG SCREEN, HOSP PERFORMED
AMPHETAMINES: NOT DETECTED
Barbiturates: NOT DETECTED
Benzodiazepines: NOT DETECTED
Cocaine: NOT DETECTED
Opiates: POSITIVE — AB
TETRAHYDROCANNABINOL: NOT DETECTED

## 2017-09-03 LAB — CBC WITH DIFFERENTIAL/PLATELET
BASOS ABS: 0 10*3/uL (ref 0.0–0.1)
Basophils Relative: 0 %
Eosinophils Absolute: 0 10*3/uL (ref 0.0–0.7)
Eosinophils Relative: 0 %
HEMATOCRIT: 37.4 % — AB (ref 39.0–52.0)
Hemoglobin: 12.2 g/dL — ABNORMAL LOW (ref 13.0–17.0)
LYMPHS ABS: 1 10*3/uL (ref 0.7–4.0)
LYMPHS PCT: 8 %
MCH: 28.2 pg (ref 26.0–34.0)
MCHC: 32.6 g/dL (ref 30.0–36.0)
MCV: 86.4 fL (ref 78.0–100.0)
Monocytes Absolute: 0.3 10*3/uL (ref 0.1–1.0)
Monocytes Relative: 2 %
NEUTROS ABS: 10.3 10*3/uL — AB (ref 1.7–7.7)
Neutrophils Relative %: 90 %
Platelets: 292 10*3/uL (ref 150–400)
RBC: 4.33 MIL/uL (ref 4.22–5.81)
RDW: 14.4 % (ref 11.5–15.5)
WBC: 11.6 10*3/uL — AB (ref 4.0–10.5)

## 2017-09-03 LAB — RESPIRATORY PANEL BY PCR
Adenovirus: NOT DETECTED
Bordetella pertussis: NOT DETECTED
CORONAVIRUS OC43-RVPPCR: NOT DETECTED
Chlamydophila pneumoniae: NOT DETECTED
Coronavirus 229E: NOT DETECTED
Coronavirus HKU1: NOT DETECTED
Coronavirus NL63: NOT DETECTED
INFLUENZA A-RVPPCR: NOT DETECTED
INFLUENZA B-RVPPCR: NOT DETECTED
METAPNEUMOVIRUS-RVPPCR: NOT DETECTED
Mycoplasma pneumoniae: NOT DETECTED
PARAINFLUENZA VIRUS 1-RVPPCR: NOT DETECTED
PARAINFLUENZA VIRUS 2-RVPPCR: NOT DETECTED
PARAINFLUENZA VIRUS 4-RVPPCR: NOT DETECTED
Parainfluenza Virus 3: NOT DETECTED
RESPIRATORY SYNCYTIAL VIRUS-RVPPCR: NOT DETECTED
RHINOVIRUS / ENTEROVIRUS - RVPPCR: NOT DETECTED

## 2017-09-03 LAB — URINE CULTURE

## 2017-09-03 LAB — STREP PNEUMONIAE URINARY ANTIGEN: Strep Pneumo Urinary Antigen: NEGATIVE

## 2017-09-03 LAB — BASIC METABOLIC PANEL
ANION GAP: 11 (ref 5–15)
BUN: 25 mg/dL — ABNORMAL HIGH (ref 6–20)
CHLORIDE: 107 mmol/L (ref 101–111)
CO2: 18 mmol/L — ABNORMAL LOW (ref 22–32)
Calcium: 8.5 mg/dL — ABNORMAL LOW (ref 8.9–10.3)
Creatinine, Ser: 1.07 mg/dL (ref 0.61–1.24)
GFR calc Af Amer: >60 mL/min (ref >60)
GLUCOSE: 185 mg/dL — AB (ref 65–99)
POTASSIUM: 4.8 mmol/L (ref 3.5–5.1)
Sodium: 136 mmol/L (ref 135–145)

## 2017-09-03 LAB — BLOOD GAS, ARTERIAL
ACID-BASE DEFICIT: 3.6 mmol/L — AB (ref 0.0–2.0)
BICARBONATE: 20.7 mmol/L (ref 20.0–28.0)
Drawn by: 358491
FIO2: 28
O2 Saturation: 97.6 %
PCO2 ART: 36 mmHg (ref 32.0–48.0)
PH ART: 7.378 (ref 7.350–7.450)
Patient temperature: 98.6
pO2, Arterial: 100 mmHg (ref 83.0–108.0)

## 2017-09-03 LAB — HIV ANTIBODY (ROUTINE TESTING W REFLEX): HIV Screen 4th Generation wRfx: NONREACTIVE

## 2017-09-03 LAB — TROPONIN I

## 2017-09-03 LAB — MRSA PCR SCREENING: MRSA by PCR: NEGATIVE

## 2017-09-03 MED ORDER — IPRATROPIUM-ALBUTEROL 0.5-2.5 (3) MG/3ML IN SOLN
3.0000 mL | Freq: Three times a day (TID) | RESPIRATORY_TRACT | Status: DC
  Administered 2017-09-03 – 2017-09-07 (×12): 3 mL via RESPIRATORY_TRACT
  Filled 2017-09-03 (×12): qty 3

## 2017-09-03 MED ORDER — DM-GUAIFENESIN ER 30-600 MG PO TB12
1.0000 | ORAL_TABLET | Freq: Two times a day (BID) | ORAL | Status: DC
  Administered 2017-09-03 – 2017-09-08 (×11): 1 via ORAL
  Filled 2017-09-03 (×11): qty 1

## 2017-09-03 NOTE — Progress Notes (Addendum)
Progress Note  Patient Name: Travis Ferguson Date of Encounter: 09/03/2017  Primary Cardiologist: Dr. Gwenlyn Found  Subjective   Patient said he slept well overnight or as well as could be expected.  He states that his breathing is improved, and he denied chest pain, nausea, vomiting, diaphoresis, abdominal pain, back pain, chest pressure, or other concerning symptoms.  Inpatient Medications    Scheduled Meds: . apixaban  5 mg Oral BID  . DULoxetine  30 mg Oral Daily  . finasteride  5 mg Oral Daily  . FLUoxetine  40 mg Oral Q breakfast  . ipratropium-albuterol  3 mL Nebulization TID  . methylPREDNISolone (SOLU-MEDROL) injection  60 mg Intravenous Q12H  .  morphine injection  4 mg Intravenous Once  . sacubitril-valsartan  1 tablet Oral BID  . simvastatin  20 mg Oral QPM   Continuous Infusions: . sodium chloride 75 mL/hr at 09/02/17 1845  . amiodarone 30 mg/hr (09/03/17 0145)  . piperacillin-tazobactam (ZOSYN)  IV Stopped (09/03/17 0300)  . vancomycin     PRN Meds: acetaminophen, albuterol, HYDROcodone-acetaminophen, morphine injection, ondansetron (ZOFRAN) IV   Vital Signs    Vitals:   09/03/17 0406 09/03/17 0423 09/03/17 0500 09/03/17 0735  BP: 129/75  122/73 134/80  Pulse: 94  87   Resp: (!) 27  20   Temp: (!) 97.3 F (36.3 C) 97.9 F (36.6 C)    TempSrc: Axillary Axillary    SpO2: 100%  100%   Weight: 118.4 kg (261 lb 0.4 oz)     Height:        Intake/Output Summary (Last 24 hours) at 09/03/2017 0744 Last data filed at 09/03/2017 0412 Gross per 24 hour  Intake 3800 ml  Output 300 ml  Net 3500 ml   Filed Weights   09/02/17 1257 09/03/17 0406  Weight: 117.5 kg (259 lb) 118.4 kg (261 lb 0.4 oz)    Telemetry    Multiple and recurrent PVC's, NSR otherwise - Personally Reviewed  ECG    09/02/2017--> Sinus tachycardia versus SVT with T-wave inversion in leads I, II, III, aVF and V6- Personally Reviewed  Physical Exam  GEN: No acute distress.  On BiPAP,  tolerating this well. Neck: No JVD Cardiac: RRR, no murmurs, rubs, or gallops but heart sounds distant given body habitus. Respiratory:  Wheezing bilaterally in the upper and lower lung fields. GI: Soft, nontender, non-distended  MS: No edema; No deformity. Neuro:  Nonfocal  Psych: Normal affect   Labs    Chemistry Recent Labs  Lab 09/02/17 1315 09/03/17 0535  NA 132* 136  K 4.5 4.8  CL 100* 107  CO2 18* 18*  GLUCOSE 171* 185*  BUN 26* 25*  CREATININE 1.45* 1.07  CALCIUM 9.4 8.5*  PROT 6.6  --   ALBUMIN 2.6*  --   AST 19  --   ALT 17  --   ALKPHOS 70  --   BILITOT 1.5*  --   GFRNONAA 45* >60  GFRAA 52* >60  ANIONGAP 14 11     Hematology Recent Labs  Lab 09/02/17 1315 09/03/17 0535  WBC 21.0* 11.6*  RBC 5.29 4.33  HGB 15.3 12.2*  HCT 45.1 37.4*  MCV 85.3 86.4  MCH 28.9 28.2  MCHC 33.9 32.6  RDW 14.6 14.4  PLT 494* 292    Cardiac Enzymes Recent Labs  Lab 09/02/17 1732 09/02/17 2214 09/03/17 0535  TROPONINI <0.03 <0.03 <0.03    Recent Labs  Lab 09/02/17 1317  TROPIPOC 0.01  BNP Recent Labs  Lab 09/02/17 1315  BNP 184.2*     DDimer No results for input(s): DDIMER in the last 168 hours.   Radiology    Ct Abdomen Pelvis W Contrast  Result Date: 09/02/2017 CLINICAL DATA:  Hematuria for 2 days. Diagnosed with urinary tract infection today. Hypotensive. On prednisone taper for breathing difficulties. History of CHF, cholecystectomy, hypertension, sarcoidosis. EXAM: CT ABDOMEN AND PELVIS WITH CONTRAST TECHNIQUE: Multidetector CT imaging of the abdomen and pelvis was performed using the standard protocol following bolus administration of intravenous contrast. CONTRAST:  54mL ISOVUE-300 IOPAMIDOL (ISOVUE-300) INJECTION 61% COMPARISON:  CT abdomen and pelvis December 28, 2014 inch chest radiograph September 02, 2017 FINDINGS: LOWER CHEST: Small LEFT pleural effusion with hepatization LEFT lower lobe. Associated centrilobular ground-glass nodules. Included  heart size is normal. No pericardial effusion. Mild coronary artery calcifications. HEPATOBILIARY: Status post cholecystectomy.  Negative CT liver. PANCREAS: Normal. SPLEEN: Normal. ADRENALS/URINARY TRACT: Kidneys are orthotopic, demonstrating symmetric enhancement. No nephrolithiasis, hydronephrosis or solid renal masses. 2.4 cm homogeneously hypodense benign-appearing cyst LEFT interpolar kidney. Too small to characterize hypodensity upper pole RIGHT kidney. The unopacified ureters are normal in course and caliber. Delayed imaging through the kidneys demonstrates symmetric prompt contrast excretion within the proximal urinary collecting system. Urinary bladder is decompressed and unremarkable. Normal adrenal glands. STOMACH/BOWEL: The stomach, small and large bowel are normal in course and caliber without inflammatory changes. Small debris-filled duodenal diverticulum. Moderate colonic diverticulosis. Normal appendix. VASCULAR/LYMPHATIC: Aortoiliac vessels are normal in course and caliber. Moderate calcific atherosclerosis. No lymphadenopathy by CT size criteria. REPRODUCTIVE: Normal. OTHER: No intraperitoneal free fluid or free air. New RIGHT lower quadrant fat stranding associated with large RIGHT fat containing inguinal hernia. Moderate LEFT fat containing inguinal hernia, status post herniorrhaphy. Focally thickened RIGHT pericolic gutter with calcification of prior site of inflammatory process. Small fat containing paraumbilical hernias. MUSCULOSKELETAL: Nonacute. Severe L4-5 and L5-S1 degenerative discs. Severe LEFT L4-5 and L5-S1 neural foraminal narrowing. IMPRESSION: 1. Large LEFT lower lobe consolidation consistent with pneumonia. Small LEFT pleural effusion. 2. Focal fat necrosis RIGHT pelvis associated with large fat containing RIGHT inguinal hernia. 3. No nephrolithiasis, hydronephrosis or pyelonephritis. Aortic Atherosclerosis (ICD10-I70.0). Electronically Signed   By: Elon Alas M.D.   On:  09/02/2017 15:42   Dg Chest Port 1 View  Result Date: 09/02/2017 CLINICAL DATA:  Chest tightness, shortness of breath and nausea over the last 2 weeks. EXAM: PORTABLE CHEST 1 VIEW COMPARISON:  08/08/2017 FINDINGS: The patient is developed a left effusion with left lower lobe atelectasis and likely pneumonia. Minimal patchy density at the right lung base. Aortic atherosclerosis again noted. Upper lungs are clear. IMPRESSION: Development of left effusion and left lower lobe volume loss/pneumonia. Electronically Signed   By: Nelson Chimes M.D.   On: 09/02/2017 15:01    Cardiac Studies   ECHO: 05/03/2016 - Left ventricle: The cavity size was normal. There was moderate hypertrophy of the septum with mild posterior wall hypertrophy. Systolic function was mildly to moderately reduced. The estimated ejection fraction was in the range of 40% to 45%. Wall motion was normal; there were no regional wall motion abnormalities. Doppler parameters are consistent with abnormal left ventricular relaxation (grade 1 diastolic dysfunction). - Aortic valve: Transvalvular velocity was within the normal range. There was no stenosis. There was trivial regurgitation. - Mitral valve: Transvalvular velocity was within the normal range. There was no evidence for stenosis. There was trivial regurgitation. - Left atrium: The atrium was mildly dilated. - Right ventricle: Systolic  function was normal. RV systolic pressure (S, est): 29 mm Hg. - Atrial septum: No defect or patent foramen ovale was identified by color flow Doppler. - Tricuspid valve: There was trivial regurgitation. - Pulmonic valve: There was trivial regurgitation. - Pulmonary arteries: Systolic pressure was within the normal range. PA peak pressure: 29 mm Hg (S). Impressions: - Compared with echo 10/2015, LVEF is relatively unchaged. Systolic function is difficult to assess and may be underestimated due to frequent  PVCs.  CATH: 01/23/2015  Ost RCA to Prox RCA lesion, 40% stenosed.  Ost LAD to Prox LAD lesion, 40% stenosed.  Ost LM to LM lesion, 40% stenosed.  There is severe left ventricular systolic dysfunction.    Patient Profile     77 y.o. male with history of hypertension, hyperlipidemia, family history of coronary artery disease, obesity, multiple sclerosis, sarcoidosis, OSA 1 PM O2, COPD, who was evaluated by cardiology for severe tachycardia at the request of Dr. Lorin Mercy.  Patient's heart rate was observed to be in the 160s when EKG however, it is unclear if this is SVT versus ST initially.  The patient is symptomatic and was unaware of the arrhythmia.  He denied palpitations, chest pain, or syncope/presyncope symptoms.  Patient heart rate on telemetry indicated multiple PVCs otherwise normal sinus rhythm.  Assessment & Plan    Atrial flutter, rapid ventricular response: Patient was given a dose of atropine which revealed an atrial flutter pattern.  The arrhythmia was most likely the result of a combination of his acute illness and chronic lung disease.  Given soft blood pressure beta-blockers and adrenal blockers are contraindicated for rate control. He was placed on amiodarone IV for rate control Recommend discontinuation of the IV amiodarone in lieu of p.o. amiodarone once the patient is stable off of BiPAP therapy. Patient was started on Eliquis for anticoagulation as a result of the patient's arrhythmia. Recommend discontinuation of the Eliquis on discharge if he continues to experience resolution of his arrhythmia. TEE cardioversion on Friday was planned if resolution of his symptoms failed to occur but this appears to do no longer be necessary  Chronic systolic CHF: EF of 63-14% by echocardiogram last year.  Grade 1 diastolic dysfunction noted. Echo ordered for this admission  Does not appear to be any current evidence of hypervolemia. Patient given mild hydration for sepsis  concern for hyperkalemia noted.  All other medical conditions as per primary team.  For questions or updates, please contact [facility] Please consult [email or web address] for contact info under Cardiology/STEMI.   Please see attending note/attestation for final assessment and plan.    Signed, Kathi Ludwig, MD  09/03/2017, 7:44 AM    Agree with that by Dr. Berline Lopes  Patient converted to sinus rhythm on IV amiodarone. He clearly had atrial flutter after administration of IV adenosine which showed flutter waves in the evening emergency room. He does have PVCs. He has mild LV dysfunction, COPD on home O2 and currently is on BiPAP. He has left lower lobe pneumonia with IV antibiotics elevated white count. We will convert him to by mouth amiodarone in several days. He is also on Eliquis presumptively for anticoagulation in anticipation of DC cardioversion which will not be necessary.we can probably discontinue the oral anticoagulation prior to discharge. He will continue his amiodarone by mouth 6-8 weeks after discharge.  Lorretta Harp, M.D., [employer], [facility], [place] [facility] [place] [address] [place], [place]  [id number]  [phone] 09/03/2017 10:12 AM

## 2017-09-03 NOTE — Progress Notes (Signed)
PROGRESS NOTE    Travis Ferguson  OFH:[id number] DOB: February 03, 1941 DOA: 09/02/2017 PCP: Crist Infante, MD   Brief Narrative:  77 y.o. WM PMHx Sarcoidosis; PTSD;  OSA on qhs 2 L O2;  COPD,HTN; Chronic Systolic and Diastolic CHF (EF 54-98% and grade, SDH DX 2010 S/Psurgery 2010 ; chronic low pain  Presenting with SOB.  Patient started with this episode about 6 weeks ago.  He noticed pressure in his chest, feeling like his chest was pushing against his lungs. When he gets out of a chair, the pressures rises in his chest.  He was unable to walk from the driveway to the back of his house without stopping.  He was unable to walk in stores, would have to sit down a lot.  He has h/o PNA in the past.  No significant cough.  No significant wheezing.  He is generally weak overall and has chronic pain.  No fever, although he has had chills.     ED Course: Pneumonia, sepsis.  SOB, saw pulm yesterday and was told to wear O2 more (usually wears qhs).  PCP gave Rocephin IM and sent him in.  On arrival extremely SOB, tachypnea, dense consolidated LLL PNA.  Started on BIPAP due to persistent tachycardia, some improvement with IVF.  Borderline hypotension.  Will give entire sepsis bolus , 3L.    Subjective: 3/13 A/O 4, sees Dr. Halford Chessman [facility] for sarcoidosis, uses 2 L O2 at night for his OSA. States was offered CPAP when first diagnosed with OSA but declined. Weighs himself daily (base weight 253 pounds/114.7 kg). States noticed at home was beginning to have increasing SOB could not walk from his bed to his bathroom without increased SOB (distance~5 feet). Would check himself on his home pulse ox and SPO2 89-90%. Also unable to walk down driveway. Positive productive cough (scant yellow).   Assessment & Plan:   Principal Problem:   Left lower lobe pneumonia ([place]) Active Problems:   Obstructive sleep apnea   COPD with chronic bronchitis (HCC)   Pulmonary sarcoidosis (HCC)   Essential hypertension    Hyperlipidemia   Acute on chronic respiratory failure with hypoxia (HCC)   Sepsis (HCC)   Tachycardia   Chronic pain syndrome   Sepsis/LLL HCAP   -On admission med criteria for sepsis Elevated WBC count, tachycardia, tachypnea with elevated lactate and borderline hypotension -CXR: LLL pneumonia see results below -CURB-65 score = 3 (patient has 14.5% risk of death/ICU care should be considered)   -Pneumonia severity index (PSI) class IV, 9.3% mortality rate -DuoNeb TID -Solu-Medrol 60 mg BID -Flutter valve -Mucinex DM -sputum culture, respiratory virus panel, strep pneumo and legionella urine antigen pending -seen in pulmonary clinic yesterday and so pulmonary was consulted to again advise.  At that time, he was thought to have worsening COPD, less likely sarcoidosis. -He does have an effusion but it does not appear to be large enough to require thoracentesis  Chronic respiratory failure with hypoxia/ Sarcoidosis/COPD/ -Per patient inactive will need to review [facility] notes. Not on any immunosuppressants per patient, backup by review of MAR  OSA -CPAP QHS   Chronic Systolic and Diastolic CHF (base weight 253 pounds/114.7 kg) -Strict in and out -Daily weight -Entresto 24-26 milligrams one tablet daily  Atrial flutter/Rapid Ventricular response -Echocardiogram pending -Cardiology has been consulted feels secondary to combination of his acute illness and chronic lung disease.  Given soft blood pressure beta-blockers and adrenal blockers are contraindicated for rate control  -amiodarone IV for  rate -eliquis 5mg  BID    HTN -see atrial flutter   HLD -Zocor 20 milligrams daily -Lipid panel pending   Chronic pain syndrome -admitting physician reviewed this patient in the [place] Controlled Substances Reporting System.  He is receiving medications from only one provider (at a time) and appears to be taking them as prescribed. -He is not at particularly high risk of opioid misuse,  diversion, or overdose. -UDS positive opiates. Patient on home opiates -Continue home medications vs. Morphine prn severe pain    DVT prophylaxis: Eliquis Code Status: Full Family Communication: Bedside for discussion of plan care Disposition Plan: TBD   Consultants:  Cardiology [facility]    Procedures/Significant Events:     I have personally reviewed and interpreted all radiology studies and my findings are as above.  VENTILATOR SETTINGS:    Cultures 3/12 blood pending 3/12 urine pending 3/12 MRSA by PCR negative 3/13 sputum pending 3/13 respiratory virus panel pending    Antimicrobials: Anti-infectives (From admission, onward)   Start     Ordered Stop   09/03/17 1800  vancomycin (VANCOCIN) 1,250 mg in sodium chloride 0.9 % 250 mL IVPB     09/02/17 1652     09/03/17 1400  cefTRIAXone (ROCEPHIN) 1 g in sodium chloride 0.9 % 100 mL IVPB  Status:  Discontinued     09/02/17 1648 09/02/17 1648   09/03/17 1400  azithromycin (ZITHROMAX) 500 mg in sodium chloride 0.9 % 250 mL IVPB  Status:  Discontinued     09/02/17 1834 09/02/17 1930   09/03/17 0000  piperacillin-tazobactam (ZOSYN) IVPB 3.375 g     09/02/17 1937     09/02/17 1730  vancomycin (VANCOCIN) 2,000 mg in sodium chloride 0.9 % 500 mL IVPB     09/02/17 1648 09/02/17 2059   09/02/17 1700  cefTRIAXone (ROCEPHIN) 1 g in sodium chloride 0.9 % 100 mL IVPB  Status:  Discontinued     09/02/17 1648 09/02/17 1930   09/02/17 1545  cefTRIAXone (ROCEPHIN) 1 g in sodium chloride 0.9 % 100 mL IVPB  Status:  Discontinued     09/02/17 1541 09/02/17 1543   09/02/17 1545  azithromycin (ZITHROMAX) 500 mg in sodium chloride 0.9 % 250 mL IVPB     09/02/17 1541 09/02/17 1737       Devices    LINES / TUBES:      Continuous Infusions: . sodium chloride 75 mL/hr at 09/02/17 1845  . amiodarone 30 mg/hr (09/03/17 0145)  . piperacillin-tazobactam (ZOSYN)  IV Stopped (09/03/17 0300)  . vancomycin        Objective: Vitals:   09/03/17 0314 09/03/17 0406 09/03/17 0423 09/03/17 0500  BP:  129/75  122/73  Pulse:  94  87  Resp:  (!) 27  20  Temp:  (!) 97.3 F (36.3 C) 97.9 F (36.6 C)   TempSrc:  Axillary Axillary   SpO2: 99% 100%  100%  Weight:  261 lb 0.4 oz (118.4 kg)    Height:        Intake/Output Summary (Last 24 hours) at 09/03/2017 0726 Last data filed at 09/03/2017 0412 Gross per 24 hour  Intake 3800 ml  Output 300 ml  Net 3500 ml   Filed Weights   09/02/17 1257 09/03/17 0406  Weight: 259 lb (117.5 kg) 261 lb 0.4 oz (118.4 kg)    Examination:  General: A/O 4, positive acute on chronic respiratory distress (wears 2 L O2 at night) Neck:  Negative scars, masses, torticollis, lymphadenopathy, JVD  Lungs: right lung fields clear to auscultation, decreased breath sounds left lung field with mild respiratory wheeze, negative crackles  Cardiovascular: Regular rate and rhythm without murmur gallop or rub normal S1 and S2 Abdomen: Obese, negative abdominal pain, nondistended, positive soft, bowel sounds, no rebound, no ascites, no appreciable mass Extremities: No significant cyanosis, clubbing, or edema bilateral lower extremities Skin: Negative rashes, lesions, ulcers Psychiatric:  Negative depression, negative anxiety, negative fatigue, negative mania  Central nervous system:  Cranial nerves II through XII intact, tongue/uvula midline, all extremities muscle strength 5/5, negative dysarthria, negative expressive aphasia, negative receptive aphasia.  .     Data Reviewed: Care during the described time interval was provided by me .  I have reviewed this patient's available data, including medical history, events of note, physical examination, and all test results as part of my evaluation.   CBC: Recent Labs  Lab 09/02/17 1315 09/03/17 0535  WBC 21.0* 11.6*  NEUTROABS  --  10.3*  HGB 15.3 12.2*  HCT 45.1 37.4*  MCV 85.3 86.4  PLT 494* 557   Basic Metabolic  Panel: Recent Labs  Lab 09/02/17 1315 09/03/17 0535  NA 132* 136  K 4.5 4.8  CL 100* 107  CO2 18* 18*  GLUCOSE 171* 185*  BUN 26* 25*  CREATININE 1.45* 1.07  CALCIUM 9.4 8.5*   GFR: Estimated Creatinine Clearance: 76.8 mL/min (by C-G formula based on SCr of 1.07 mg/dL). Liver Function Tests: Recent Labs  Lab 09/02/17 1315  AST 19  ALT 17  ALKPHOS 70  BILITOT 1.5*  PROT 6.6  ALBUMIN 2.6*   No results for input(s): LIPASE, AMYLASE in the last 168 hours. No results for input(s): AMMONIA in the last 168 hours. Coagulation Profile: No results for input(s): INR, PROTIME in the last 168 hours. Cardiac Enzymes: Recent Labs  Lab 09/02/17 1732 09/02/17 2214 09/03/17 0535  TROPONINI <0.03 <0.03 <0.03   BNP (last 3 results) Recent Labs    08/08/17 0958  PROBNP 92.0   HbA1C: No results for input(s): HGBA1C in the last 72 hours. CBG: No results for input(s): GLUCAP in the last 168 hours. Lipid Profile: No results for input(s): CHOL, HDL, LDLCALC, TRIG, CHOLHDL, LDLDIRECT in the last 72 hours. Thyroid Function Tests: Recent Labs    09/02/17 2007  TSH 0.894   Anemia Panel: No results for input(s): VITAMINB12, FOLATE, FERRITIN, TIBC, IRON, RETICCTPCT in the last 72 hours. Urine analysis:    Component Value Date/Time   COLORURINE YELLOW 09/02/2017 [place] 09/02/2017 1716   LABSPEC >1.046 (H) 09/02/2017 1716   PHURINE 5.0 09/02/2017 1716   GLUCOSEU NEGATIVE 09/02/2017 1716   HGBUR NEGATIVE 09/02/2017 1716   BILIRUBINUR NEGATIVE 09/02/2017 1716   KETONESUR NEGATIVE 09/02/2017 1716   PROTEINUR NEGATIVE 09/02/2017 1716   UROBILINOGEN 0.2 08/19/2014 1909   NITRITE NEGATIVE 09/02/2017 1716   LEUKOCYTESUR NEGATIVE 09/02/2017 1716   Sepsis Labs: @LABRCNTIP (procalcitonin:4,lacticidven:4)  ) Recent Results (from the past 240 hour(s))  MRSA PCR Screening     Status: None   Collection Time: 09/02/17 11:14 PM  Result Value Ref Range Status   MRSA  by PCR NEGATIVE NEGATIVE Final    Comment:        The GeneXpert MRSA Assay (FDA approved for NASAL specimens only), is one component of a comprehensive MRSA colonization surveillance program. It is not intended to diagnose MRSA infection nor to guide or monitor treatment for MRSA infections. Performed at [facility], [place] [address]  [place], [place] [address]          Radiology Studies: Ct Abdomen Pelvis W Contrast  Result Date: 09/02/2017 CLINICAL DATA:  Hematuria for 2 days. Diagnosed with urinary tract infection today. Hypotensive. On prednisone taper for breathing difficulties. History of CHF, cholecystectomy, hypertension, sarcoidosis. EXAM: CT ABDOMEN AND PELVIS WITH CONTRAST TECHNIQUE: Multidetector CT imaging of the abdomen and pelvis was performed using the standard protocol following bolus administration of intravenous contrast. CONTRAST:  38mL ISOVUE-300 IOPAMIDOL (ISOVUE-300) INJECTION 61% COMPARISON:  CT abdomen and pelvis December 28, 2014 inch chest radiograph September 02, 2017 FINDINGS: LOWER CHEST: Small LEFT pleural effusion with hepatization LEFT lower lobe. Associated centrilobular ground-glass nodules. Included heart size is normal. No pericardial effusion. Mild coronary artery calcifications. HEPATOBILIARY: Status post cholecystectomy.  Negative CT liver. PANCREAS: Normal. SPLEEN: Normal. ADRENALS/URINARY TRACT: Kidneys are orthotopic, demonstrating symmetric enhancement. No nephrolithiasis, hydronephrosis or solid renal masses. 2.4 cm homogeneously hypodense benign-appearing cyst LEFT interpolar kidney. Too small to characterize hypodensity upper pole RIGHT kidney. The unopacified ureters are normal in course and caliber. Delayed imaging through the kidneys demonstrates symmetric prompt contrast excretion within the proximal urinary collecting system. Urinary bladder is decompressed and unremarkable. Normal adrenal glands. STOMACH/BOWEL: The stomach, small and large bowel  are normal in course and caliber without inflammatory changes. Small debris-filled duodenal diverticulum. Moderate colonic diverticulosis. Normal appendix. VASCULAR/LYMPHATIC: Aortoiliac vessels are normal in course and caliber. Moderate calcific atherosclerosis. No lymphadenopathy by CT size criteria. REPRODUCTIVE: Normal. OTHER: No intraperitoneal free fluid or free air. New RIGHT lower quadrant fat stranding associated with large RIGHT fat containing inguinal hernia. Moderate LEFT fat containing inguinal hernia, status post herniorrhaphy. Focally thickened RIGHT pericolic gutter with calcification of prior site of inflammatory process. Small fat containing paraumbilical hernias. MUSCULOSKELETAL: Nonacute. Severe L4-5 and L5-S1 degenerative discs. Severe LEFT L4-5 and L5-S1 neural foraminal narrowing. IMPRESSION: 1. Large LEFT lower lobe consolidation consistent with pneumonia. Small LEFT pleural effusion. 2. Focal fat necrosis RIGHT pelvis associated with large fat containing RIGHT inguinal hernia. 3. No nephrolithiasis, hydronephrosis or pyelonephritis. Aortic Atherosclerosis (ICD10-I70.0). Electronically Signed   By: Elon Alas M.D.   On: 09/02/2017 15:42   Dg Chest Port 1 View  Result Date: 09/02/2017 CLINICAL DATA:  Chest tightness, shortness of breath and nausea over the last 2 weeks. EXAM: PORTABLE CHEST 1 VIEW COMPARISON:  08/08/2017 FINDINGS: The patient is developed a left effusion with left lower lobe atelectasis and likely pneumonia. Minimal patchy density at the right lung base. Aortic atherosclerosis again noted. Upper lungs are clear. IMPRESSION: Development of left effusion and left lower lobe volume loss/pneumonia. Electronically Signed   By: Nelson Chimes M.D.   On: 09/02/2017 15:01        Scheduled Meds: . apixaban  5 mg Oral BID  . DULoxetine  30 mg Oral Daily  . finasteride  5 mg Oral Daily  . FLUoxetine  40 mg Oral Q breakfast  . ipratropium-albuterol  3 mL Nebulization  QID  . methylPREDNISolone (SOLU-MEDROL) injection  60 mg Intravenous Q12H  .  morphine injection  4 mg Intravenous Once  . sacubitril-valsartan  1 tablet Oral BID  . simvastatin  20 mg Oral QPM   Continuous Infusions: . sodium chloride 75 mL/hr at 09/02/17 1845  . amiodarone 30 mg/hr (09/03/17 0145)  . piperacillin-tazobactam (ZOSYN)  IV Stopped (09/03/17 0300)  . vancomycin       LOS: 1 day    Time spent: 40 minutes    Jatoya Armbrister, Geraldo Docker, MD  [facility] Pager [phone]   If 7PM-7AM, please contact night-coverage [email or web address] Password [facility] 09/03/2017, 7:26 AM

## 2017-09-03 NOTE — Progress Notes (Signed)
Name: Travis Ferguson MRN: [id number] DOB: 1940/09/04    ADMISSION DATE:  09/02/2017 CONSULTATION DATE:  09/02/2017  REFERRING MD :  Dr. Lorin Mercy  CHIEF COMPLAINT:  SOB, hematuria, abd pain  HISTORY OF PRESENT ILLNESS:   77 year old male with PMH as below significant for but not limited to former smoker, COPD, OSA on O2 hs, sarcoidosis, and combined HF who presented with worsening shortness of breath and hematuria.  He is followed by Dr. Halford Chessman, last seen yesterday/3/11 in the office where he stated he was still having SOB at rest and with exertion, wheezing, chest tightness, feels as he is suffocating at night and with progressive decline in his activity level.  His respiratory symptoms recently improved with a course of prednisone, thought symptoms more related to COPD rather than sarcoidosis.  He was able to maintain his SpO2 on room air with exertion in office.  Recommendations made for continued steroid taper, and repeat sleep study.  He followed up with his PCP today for worsening symptoms and development of chills, nausea, vomiting, and diarrhea x1.  He was treated for a UTI with rocephin IM.  He denies any recent sick contacts, palpitations, fever, change in sputum production.    In the ER, he was borderline hypotensive at 96/62, afebrile, tachypneic and found to be tachycardic in the 160's with room air sats of 96% .  CXR showing left lower lobe pneumonia with effusion which was also noted on abdominal CT.  Labs notable for WBC 21, BNP 184, lactic acid 2.73 -> 1.43, sCr 1.45 (baseline 0.85), UA here without nitrates or leukocytes.  He was given 3L NS bolus and started on vancomycin, ceftriaxone and azithromycin.  He was placed on BiPAP for increased work of breathing.  Cardiology was also consulted reference to persistent tachycardia despite afebrile and fluid bolus; adenosine was given revealing atrial flutter.  Patient admitted to SDU by [facility] with PCCM asked to consult for pulmonary.     SUBJECTIVE:  No events overnight, feels better  VITAL SIGNS: Temp:  [97.1 F (36.2 C)-97.9 F (36.6 C)] 97.1 F (36.2 C) (03/13 0800) Pulse Rate:  [55-171] 87 (03/13 0500) Resp:  [20-32] 20 (03/13 0500) BP: (86-134)/(53-94) 134/80 (03/13 0735) SpO2:  [92 %-100 %] 100 % (03/13 0500) FiO2 (%):  [40 %] 40 % (03/13 0800) Weight:  [259 lb (117.5 kg)-261 lb 0.4 oz (118.4 kg)] 261 lb 0.4 oz (118.4 kg) (03/13 0406)  PHYSICAL EXAMINATION: General:  Elderly male, resting in exam bed in NAD HEENT:  PERRL, EOM-I and MMM Neuro: Alert and interactive, moving all ext to command CV:  RRR, Nl S1/S2 and -M/R/G. PULM: Bibasilar crackles with distant BS diffusely GI: Soft, NT, ND and +BS and obese Extremities: -edema and -tenderness Skin: no rashes or lesions  Recent Labs  Lab 09/02/17 1315 09/03/17 0535  NA 132* 136  K 4.5 4.8  CL 100* 107  CO2 18* 18*  BUN 26* 25*  CREATININE 1.45* 1.07  GLUCOSE 171* 185*   Recent Labs  Lab 09/02/17 1315 09/03/17 0535  HGB 15.3 12.2*  HCT 45.1 37.4*  WBC 21.0* 11.6*  PLT 494* 292   Ct Abdomen Pelvis W Contrast  Result Date: 09/02/2017 CLINICAL DATA:  Hematuria for 2 days. Diagnosed with urinary tract infection today. Hypotensive. On prednisone taper for breathing difficulties. History of CHF, cholecystectomy, hypertension, sarcoidosis. EXAM: CT ABDOMEN AND PELVIS WITH CONTRAST TECHNIQUE: Multidetector CT imaging of the abdomen and pelvis was performed using the standard  protocol following bolus administration of intravenous contrast. CONTRAST:  49mL ISOVUE-300 IOPAMIDOL (ISOVUE-300) INJECTION 61% COMPARISON:  CT abdomen and pelvis December 28, 2014 inch chest radiograph September 02, 2017 FINDINGS: LOWER CHEST: Small LEFT pleural effusion with hepatization LEFT lower lobe. Associated centrilobular ground-glass nodules. Included heart size is normal. No pericardial effusion. Mild coronary artery calcifications. HEPATOBILIARY: Status post cholecystectomy.   Negative CT liver. PANCREAS: Normal. SPLEEN: Normal. ADRENALS/URINARY TRACT: Kidneys are orthotopic, demonstrating symmetric enhancement. No nephrolithiasis, hydronephrosis or solid renal masses. 2.4 cm homogeneously hypodense benign-appearing cyst LEFT interpolar kidney. Too small to characterize hypodensity upper pole RIGHT kidney. The unopacified ureters are normal in course and caliber. Delayed imaging through the kidneys demonstrates symmetric prompt contrast excretion within the proximal urinary collecting system. Urinary bladder is decompressed and unremarkable. Normal adrenal glands. STOMACH/BOWEL: The stomach, small and large bowel are normal in course and caliber without inflammatory changes. Small debris-filled duodenal diverticulum. Moderate colonic diverticulosis. Normal appendix. VASCULAR/LYMPHATIC: Aortoiliac vessels are normal in course and caliber. Moderate calcific atherosclerosis. No lymphadenopathy by CT size criteria. REPRODUCTIVE: Normal. OTHER: No intraperitoneal free fluid or free air. New RIGHT lower quadrant fat stranding associated with large RIGHT fat containing inguinal hernia. Moderate LEFT fat containing inguinal hernia, status post herniorrhaphy. Focally thickened RIGHT pericolic gutter with calcification of prior site of inflammatory process. Small fat containing paraumbilical hernias. MUSCULOSKELETAL: Nonacute. Severe L4-5 and L5-S1 degenerative discs. Severe LEFT L4-5 and L5-S1 neural foraminal narrowing. IMPRESSION: 1. Large LEFT lower lobe consolidation consistent with pneumonia. Small LEFT pleural effusion. 2. Focal fat necrosis RIGHT pelvis associated with large fat containing RIGHT inguinal hernia. 3. No nephrolithiasis, hydronephrosis or pyelonephritis. Aortic Atherosclerosis (ICD10-I70.0). Electronically Signed   By: Elon Alas M.D.   On: 09/02/2017 15:42   Dg Chest Port 1 View  Result Date: 09/02/2017 CLINICAL DATA:  Chest tightness, shortness of breath and  nausea over the last 2 weeks. EXAM: PORTABLE CHEST 1 VIEW COMPARISON:  08/08/2017 FINDINGS: The patient is developed a left effusion with left lower lobe atelectasis and likely pneumonia. Minimal patchy density at the right lung base. Aortic atherosclerosis again noted. Upper lungs are clear. IMPRESSION: Development of left effusion and left lower lobe volume loss/pneumonia. Electronically Signed   By: Nelson Chimes M.D.   On: 09/02/2017 15:01   SIGNIFICANT EVENTS  3/12  Admit to SDU by [facility]  STUDIES:  3/12 CT abd >>  1. Large LEFT lower lobe consolidation consistent with pneumonia. Small LEFT pleural effusion. 2. Focal fat necrosis RIGHT pelvis associated with large fat containing RIGHT inguinal hernia. 3. No nephrolithiasis, hydronephrosis or pyelonephritis.  CULTURES: 3/12 BCx2 >> 3/12 UC >>  ANTIBIOTICS:  3/12 ceftriaxone IM; IV >> 3/2 azithromycin 3/13 3/12 vancomycin>>> Zosyn 3/13>>>  I reviewed CXR, pleural effusion and on CT there is left lower lobe infiltrate noted  BRIEF PATIENT DESCRIPTION:  68 yoM w/PMH of COPD, OSA, hx sarcoid presenting with hematuria x 2 days and progressive SOB.  Found to have LLL pna, SIRS, borderline hypotension improved with IVFs, remained tachycardic- cardiology evaluating.  Placed on NIPPV for increased WOB.  Off BiPAP on 3/13 with procal of 0.39 in an immune compromised patient and CT with LLL infiltrate.  Discussed with PCCM-NP.  ASSESSMENT / PLAN:  LLL PNA in an immune compromised patient, treat as HCAP  - Vanc  - Zosyn  - F/U on culture  - F/U procalcitonin  - Would continue broad spectrum abx given above history  - D/C rocephin/zithromax, this is not CAP  Acute on chronic hypoxic respiratory failure   - Titrate O2 for sat of 88-92%  - D/C BiPAP  Acute pulmonary edema: due to a-fib with RVR  - Lasix as ordered  - Strict I/O  OSA:  - Change BiPAP to CPAP when asleep  Left pleural Effusion- new, ?afib with RVR vs infection  related  - Too small to tap, no thora today  - CXR in AM  COPD, resolving exacerbation  - Continue steroids  - BD as ordered  - Budesonide BID  - Duoneb Q4 hours  Hypoxemia:  - Titrate O2 for sat of 88-92% Hx Sarcodosis  - Change PO prednisone (usually on 10 mg, recently decreased to 5 daily) to IV solumedrol then taper down to 10 mg when improved  - Do not discharge on less than 10 mg/day prednisone  A-fib with RVR  - Amiodarone  - Rate control  PCCM will follow  Rush Farmer, M.D. [facility] Pulmonary/Critical Care Medicine. Pager: [phone]. After hours pager: [phone].

## 2017-09-04 ENCOUNTER — Inpatient Hospital Stay (HOSPITAL_COMMUNITY): Payer: Medicare Other

## 2017-09-04 DIAGNOSIS — I483 Typical atrial flutter: Secondary | ICD-10-CM

## 2017-09-04 DIAGNOSIS — D860 Sarcoidosis of lung: Secondary | ICD-10-CM

## 2017-09-04 LAB — CBC
HEMATOCRIT: 36.7 % — AB (ref 39.0–52.0)
HEMOGLOBIN: 11.7 g/dL — AB (ref 13.0–17.0)
MCH: 27.5 pg (ref 26.0–34.0)
MCHC: 31.9 g/dL (ref 30.0–36.0)
MCV: 86.4 fL (ref 78.0–100.0)
Platelets: 373 10*3/uL (ref 150–400)
RBC: 4.25 MIL/uL (ref 4.22–5.81)
RDW: 14.2 % (ref 11.5–15.5)
WBC: 16.8 10*3/uL — ABNORMAL HIGH (ref 4.0–10.5)

## 2017-09-04 LAB — SPIROMETRY WITH GRAPH
DL/VA % pred: 74 %
DL/VA: 3.49 ml/min/mmHg/L
DLCO UNC: 10 ml/min/mmHg
DLCO cor % pred: 31 %
DLCO cor: 11.02 ml/min/mmHg
DLCO unc % pred: 28 %
FEF 25-75 Pre: 0.83 L/sec
FEF2575-%PRED-PRE: 35 %
FEV1-%PRED-PRE: 41 %
FEV1-PRE: 1.35 L
FEV1FVC-%Pred-Pre: 89 %
FEV6-%PRED-PRE: 48 %
FEV6-Pre: 2.07 L
FEV6FVC-%PRED-PRE: 105 %
FVC-%PRED-PRE: 46 %
FVC-PRE: 2.08 L
PRE FEV1/FVC RATIO: 65 %
Pre FEV6/FVC Ratio: 100 %

## 2017-09-04 LAB — LEGIONELLA PNEUMOPHILA SEROGP 1 UR AG: L. pneumophila Serogp 1 Ur Ag: NEGATIVE

## 2017-09-04 LAB — BASIC METABOLIC PANEL
Anion gap: 7 (ref 5–15)
BUN: 19 mg/dL (ref 6–20)
CALCIUM: 8.4 mg/dL — AB (ref 8.9–10.3)
CHLORIDE: 106 mmol/L (ref 101–111)
CO2: 21 mmol/L — ABNORMAL LOW (ref 22–32)
CREATININE: 0.9 mg/dL (ref 0.61–1.24)
Glucose, Bld: 168 mg/dL — ABNORMAL HIGH (ref 65–99)
Potassium: 4.4 mmol/L (ref 3.5–5.1)
SODIUM: 134 mmol/L — AB (ref 135–145)

## 2017-09-04 LAB — ECHOCARDIOGRAM COMPLETE
Height: 72 in
Weight: 4296.32 oz

## 2017-09-04 LAB — LIPID PANEL
CHOLESTEROL: 129 mg/dL (ref 0–200)
HDL: 25 mg/dL — ABNORMAL LOW (ref >40)
LDL Cholesterol: 84 mg/dL (ref 0–99)
TRIGLYCERIDES: 101 mg/dL (ref <150)
Total CHOL/HDL Ratio: 5.2 RATIO
VLDL: 20 mg/dL (ref 0–40)

## 2017-09-04 LAB — MAGNESIUM: Magnesium: 1.9 mg/dL (ref 1.7–2.4)

## 2017-09-04 MED ORDER — AMIODARONE HCL 200 MG PO TABS
200.0000 mg | ORAL_TABLET | Freq: Two times a day (BID) | ORAL | Status: DC
  Administered 2017-09-04 – 2017-09-08 (×9): 200 mg via ORAL
  Filled 2017-09-04 (×9): qty 1

## 2017-09-04 MED ORDER — SIMVASTATIN 40 MG PO TABS: 40.0000 mg | ORAL_TABLET | Freq: Every evening | ORAL | Status: DC

## 2017-09-04 MED ORDER — METOPROLOL TARTRATE 25 MG PO TABS
25.0000 mg | ORAL_TABLET | Freq: Two times a day (BID) | ORAL | Status: DC
Start: 2017-09-04 — End: 2017-09-08
  Administered 2017-09-04 – 2017-09-08 (×9): 25 mg via ORAL
  Filled 2017-09-04 (×9): qty 1

## 2017-09-04 MED ORDER — ATORVASTATIN CALCIUM 20 MG PO TABS
20.0000 mg | ORAL_TABLET | Freq: Every day | ORAL | Status: DC
  Administered 2017-09-04 – 2017-09-07 (×4): 20 mg via ORAL
  Filled 2017-09-04 (×4): qty 1

## 2017-09-04 MED ORDER — VANCOMYCIN HCL IN DEXTROSE 750-5 MG/150ML-% IV SOLN
750.0000 mg | Freq: Two times a day (BID) | INTRAVENOUS | Status: DC
  Administered 2017-09-04 – 2017-09-05 (×3): 750 mg via INTRAVENOUS
  Filled 2017-09-04 (×3): qty 150

## 2017-09-04 MED ORDER — PERFLUTREN LIPID MICROSPHERE
1.0000 mL | INTRAVENOUS | Status: AC | PRN
  Administered 2017-09-04: 4 mL via INTRAVENOUS
  Filled 2017-09-04: qty 10

## 2017-09-04 MED ORDER — FUROSEMIDE 10 MG/ML IJ SOLN
60.0000 mg | Freq: Two times a day (BID) | INTRAMUSCULAR | Status: DC
  Administered 2017-09-04 – 2017-09-06 (×6): 60 mg via INTRAVENOUS
  Filled 2017-09-04 (×6): qty 6

## 2017-09-04 MED ORDER — PERFLUTREN LIPID MICROSPHERE
INTRAVENOUS | Status: AC
  Filled 2017-09-04: qty 10

## 2017-09-04 NOTE — Progress Notes (Signed)
PROGRESS NOTE    Travis Ferguson  ZHY:[id number] DOB: [birth year] DOA: 09/02/2017 PCP: Crist Infante, MD   Brief Narrative:  77 y.o. WM PMHx Sarcoidosis; PTSD;  OSA on qhs 2 L O2;  COPD,HTN; Chronic Systolic and Diastolic CHF (EF 29-52% and grade, SDH DX 2010 S/Psurgery 2010 ; chronic low pain  Presenting with SOB.  Patient started with this episode about 6 weeks ago.  He noticed pressure in his chest, feeling like his chest was pushing against his lungs. When he gets out of a chair, the pressures rises in his chest.  He was unable to walk from the driveway to the back of his house without stopping.  He was unable to walk in stores, would have to sit down a lot.  He has h/o PNA in the past.  No significant cough.  No significant wheezing.  He is generally weak overall and has chronic pain.  No fever, although he has had chills.     ED Course: Pneumonia, sepsis.  SOB, saw pulm yesterday and was told to wear O2 more (usually wears qhs).  PCP gave Rocephin IM and sent him in.  On arrival extremely SOB, tachypnea, dense consolidated LLL PNA.  Started on BIPAP due to persistent tachycardia, some improvement with IVF.  Borderline hypotension.  Will give entire sepsis bolus , 3L.    Subjective: 3/14   A/O 4, sees Dr. Halford Chessman [facility] for sarcoidosis, uses 2 L O2 at night for his OSA. States was offered CPAP when first diagnosed with OSA but declined. Weighs himself daily (base weight 253 pounds/114.7 kg). States noticed at home was beginning to have increasing SOB could not walk from his bed to his bathroom without increased SOB (distance~5 feet). Would check himself on his home pulse ox and SPO2 89-90%. Also unable to walk down driveway. Positive productive cough (scant yellow).   Assessment & Plan:   Principal Problem:   Left lower lobe pneumonia ([place]) Active Problems:   Obstructive sleep apnea   COPD with chronic bronchitis (HCC)   Pulmonary sarcoidosis (HCC)   Essential hypertension  Hyperlipidemia   Pleural effusion   Acute on chronic respiratory failure with hypoxia (HCC)   Sepsis (HCC)   Tachycardia   Chronic pain syndrome   Sepsis unspecified organism/LLL HCAP   -On admission med criteria for sepsis Elevated WBC count, tachycardia, tachypnea with elevated lactate and borderline hypotension -CXR: LLL pneumonia see results below -CURB-65 score = 3 (patient has 14.5% risk of death/ICU care should be considered)   -Pneumonia severity index (PSI) class IV, 9.3% mortality rate -DuoNeb TID -Solu-Medrol 60 mg BID -Flutter valve -Mucinex DM -Respiratory virus panel, strep pneumo urine antigen negative. Legionella urine antigen and sputum culture pending.,  -Review of Dr. Juanetta Gosling outpatient note from 3/11Respiratory symptoms have improved after recent course of prednisone.  I think his symptoms are from COPD.  Less concerned about sarcoid.  Cardiac status seems stable.  He also has upper airway irritation.  He has noticed more snoring, sleep disruption, and waking up feeling like he can't breath.  I am concerned his sleep apnea has progressed and he is now willing to try CPAP if he needs to.  -Contacted Andres Ege in order to obtain Trilogy unfortunately ABG not obtained prior to patient being placed on BiPAP. Have requested spirometry in order to obtain data required by insurance company for [employer]. -   COPD with chronic bronchitis -Had been discharged from Dr. Juanetta Gosling  office on 3/11 on prednisone  5 mg for 2 weeks. Given his HCAP WILL MOST LIKELY NEED A LONGER STEROID*TAPER.  -When appropriate restart Symbicort and PRN albuterol.    Acute on Chronic respiratory failure with hypoxia  -Patient normally on 2 L O2 via [place] at home titrate to maintain SPO2 89-93%  -Hopefully will be able to obtain Trilogy prior to patient's discharge.   Pulmonary Sarcoidosis  -Defer CT of chest. Per [facility] request -Review of MAR shows that patient is not on immunosuppressive at this time.  Will defer to patient's pulmonologist in regards to if/when to start immunosuppressant.  OSA -CPAP QHS   Chronic Systolic and Diastolic CHF (base weight 253 pounds/114.7 kg) -Strict in and out since admission +5.2 L -Daily weight Filed Weights   09/02/17 1257 09/03/17 0406 09/04/17 0412  Weight: 259 lb (117.5 kg) 261 lb 0.4 oz (118.4 kg) 268 lb 8.3 oz (121.8 kg)  -Entresto 24-26 milligrams one tablet daily -Patient now fluid overloaded from fluid resuscitation. -3/14 Lasix IV 60 mg BID -Amiodarone 200 mg BID -Eliquis 5 mg BID  Atrial flutter/Rapid Ventricular response -Echocardiogram pending -Cardiology has been consulted feels secondary to combination of his acute illness and chronic lung disease.  Given soft blood pressure beta-blockers and adrenal blockers are contraindicated for rate control  -See CHF   Essential HTN -see atrial flutter   HLD -Lipid panel not within AHA guidelines -3/14 increase Zocor 40 mg daily    Chronic pain syndrome -admitting physician reviewed this patient in the [place] Controlled Substances Reporting System.  He is receiving medications from only one provider (at a time) and appears to be taking them as prescribed. -He is not at particularly high risk of opioid misuse, diversion, or overdose. -UDS positive opiates. Patient on home opiates -Continue home medications vs. Morphine prn severe pain    DVT prophylaxis: Eliquis Code Status: Full Family Communication: Bedside for discussion of plan care Disposition Plan: TBD   Consultants:  Cardiology [facility]    Procedures/Significant Events:     I have personally reviewed and interpreted all radiology studies and my findings are as above.  VENTILATOR SETTINGS:    Cultures 3/12 blood NGTD 3/12 urine positive multiple species 3/12 MRSA by PCR negative 3/13 sputum pending 3/13 respiratory virus panel negative 3/13 strep pneumo urine antigen negative 3/13 legionella urine antigen  pending 3/14 urine culture pending    Antimicrobials: Anti-infectives (From admission, onward)   Start     Ordered Stop   09/03/17 1800  vancomycin (VANCOCIN) 1,250 mg in sodium chloride 0.9 % 250 mL IVPB     09/02/17 1652     09/03/17 1400  cefTRIAXone (ROCEPHIN) 1 g in sodium chloride 0.9 % 100 mL IVPB  Status:  Discontinued     09/02/17 1648 09/02/17 1648   09/03/17 1400  azithromycin (ZITHROMAX) 500 mg in sodium chloride 0.9 % 250 mL IVPB  Status:  Discontinued     09/02/17 1834 09/02/17 1930   09/03/17 0000  piperacillin-tazobactam (ZOSYN) IVPB 3.375 g     09/02/17 1937     09/02/17 1730  vancomycin (VANCOCIN) 2,000 mg in sodium chloride 0.9 % 500 mL IVPB     09/02/17 1648 09/02/17 2059   09/02/17 1700  cefTRIAXone (ROCEPHIN) 1 g in sodium chloride 0.9 % 100 mL IVPB  Status:  Discontinued     09/02/17 1648 09/02/17 1930   09/02/17 1545  cefTRIAXone (ROCEPHIN) 1 g in sodium chloride 0.9 % 100 mL IVPB  Status:  Discontinued  09/02/17 1541 09/02/17 1543   09/02/17 1545  azithromycin (ZITHROMAX) 500 mg in sodium chloride 0.9 % 250 mL IVPB     09/02/17 1541 09/02/17 1737       Devices    LINES / TUBES:      Continuous Infusions: . amiodarone 30 mg/hr (09/03/17 1900)  . piperacillin-tazobactam (ZOSYN)  IV 3.375 g (09/04/17 0814)  . vancomycin Stopped (09/03/17 1929)     Objective: Vitals:   09/03/17 2200 09/03/17 2300 09/04/17 0412 09/04/17 0710  BP: 123/79 131/83 133/78 (!) 148/89  Pulse:  (!) 103 (!) 103 (!) 109  Resp: (!) 22 (!) 25 (!) 21 (!) 23  Temp:  98.5 F (36.9 C) (!) 97.5 F (36.4 C) 98.4 F (36.9 C)  TempSrc:  Oral Oral Oral  SpO2:  98% 98% 97%  Weight:   268 lb 8.3 oz (121.8 kg)   Height:        Intake/Output Summary (Last 24 hours) at 09/04/2017 8119 Last data filed at 09/04/2017 0700 Gross per 24 hour  Intake 2153.96 ml  Output 910 ml  Net 1243.96 ml   Filed Weights   09/02/17 1257 09/03/17 0406 09/04/17 0412  Weight: 259 lb (117.5 kg)  261 lb 0.4 oz (118.4 kg) 268 lb 8.3 oz (121.8 kg)    Physical Exam:  General: A/O 4, positive acute on chronic respiratory distress (wears 2 L O2 at night) Neck:  Negative scars, masses, torticollis, lymphadenopathy, JVD Lungs:  diffuse expiratory wheeze, negative crackles Cardiovascular: Regular rate and rhythm without murmur gallop or rub normal S1 and S2 Abdomen: Obese, negative abdominal pain, nondistended, positive soft, bowel sounds, no rebound, no ascites, no appreciable mass Extremities: No significant cyanosis, clubbing, or edema bilateral lower extremities Skin: Negative rashes, lesions, ulcers Psychiatric:  Negative depression, negative anxiety, negative fatigue, negative mania  Central nervous system:  Cranial nerves II through XII intact, tongue/uvula midline, all extremities muscle strength 5/5, negative dysarthria, negative expressive aphasia, negative receptive aphasia. .     Data Reviewed: Care during the described time interval was provided by me .  I have reviewed this patient's available data, including medical history, events of note, physical examination, and all test results as part of my evaluation.   CBC: Recent Labs  Lab 09/02/17 1315 09/03/17 0535  WBC 21.0* 11.6*  NEUTROABS  --  10.3*  HGB 15.3 12.2*  HCT 45.1 37.4*  MCV 85.3 86.4  PLT 494* 147   Basic Metabolic Panel: Recent Labs  Lab 09/02/17 1315 09/03/17 0535 09/04/17 0308  NA 132* 136 134*  K 4.5 4.8 4.4  CL 100* 107 106  CO2 18* 18* 21*  GLUCOSE 171* 185* 168*  BUN 26* 25* 19  CREATININE 1.45* 1.07 0.90  CALCIUM 9.4 8.5* 8.4*   GFR: Estimated Creatinine Clearance: 92.7 mL/min (by C-G formula based on SCr of 0.9 mg/dL). Liver Function Tests: Recent Labs  Lab 09/02/17 1315  AST 19  ALT 17  ALKPHOS 70  BILITOT 1.5*  PROT 6.6  ALBUMIN 2.6*   No results for input(s): LIPASE, AMYLASE in the last 168 hours. No results for input(s): AMMONIA in the last 168 hours. Coagulation  Profile: No results for input(s): INR, PROTIME in the last 168 hours. Cardiac Enzymes: Recent Labs  Lab 09/02/17 1732 09/02/17 2214 09/03/17 0535  TROPONINI <0.03 <0.03 <0.03   BNP (last 3 results) Recent Labs    08/08/17 0958  PROBNP 92.0   HbA1C: No results for input(s): HGBA1C in the last  72 hours. CBG: No results for input(s): GLUCAP in the last 168 hours. Lipid Profile: Recent Labs    09/04/17 0308  CHOL 129  HDL 25*  LDLCALC 84  TRIG 101  CHOLHDL 5.2   Thyroid Function Tests: Recent Labs    09/02/17 2007  TSH 0.894   Anemia Panel: No results for input(s): VITAMINB12, FOLATE, FERRITIN, TIBC, IRON, RETICCTPCT in the last 72 hours. Urine analysis:    Component Value Date/Time   COLORURINE YELLOW 09/02/2017 [place] 09/02/2017 1716   LABSPEC >1.046 (H) 09/02/2017 1716   PHURINE 5.0 09/02/2017 1716   GLUCOSEU NEGATIVE 09/02/2017 1716   HGBUR NEGATIVE 09/02/2017 1716   BILIRUBINUR NEGATIVE 09/02/2017 1716   KETONESUR NEGATIVE 09/02/2017 1716   PROTEINUR NEGATIVE 09/02/2017 1716   UROBILINOGEN 0.2 08/19/2014 1909   NITRITE NEGATIVE 09/02/2017 1716   LEUKOCYTESUR NEGATIVE 09/02/2017 1716   Sepsis Labs: @LABRCNTIP (procalcitonin:4,lacticidven:4)  ) Recent Results (from the past 240 hour(s))  Blood culture (routine x 2)     Status: None (Preliminary result)   Collection Time: 09/02/17  1:55 PM  Result Value Ref Range Status   Specimen Description BLOOD LEFT ANTECUBITAL  Final   Special Requests   Final    BOTTLES DRAWN AEROBIC AND ANAEROBIC Blood Culture adequate volume   Culture   Final    NO GROWTH < 24 HOURS Performed at [facility], [place] [address] [place], [place] [address]    Report Status PENDING  Incomplete  Blood culture (routine x 2)     Status: None (Preliminary result)   Collection Time: 09/02/17  1:55 PM  Result Value Ref Range Status   Specimen Description BLOOD RIGHT ANTECUBITAL  Final   Special Requests    Final    BOTTLES DRAWN AEROBIC AND ANAEROBIC Blood Culture adequate volume   Culture   Final    NO GROWTH < 24 HOURS Performed at [facility], [place] [address] [place], [place] [address]    Report Status PENDING  Incomplete  Urine culture     Status: Abnormal   Collection Time: 09/02/17  3:23 PM  Result Value Ref Range Status   Specimen Description URINE, CLEAN CATCH  Final   Special Requests   Final    NONE Performed at [facility], [place] [address], [place], [place] [address]    Culture MULTIPLE SPECIES PRESENT, SUGGEST RECOLLECTION (A)  Final   Report Status 09/03/2017 FINAL  Final  MRSA PCR Screening     Status: None   Collection Time: 09/02/17 11:14 PM  Result Value Ref Range Status   MRSA by PCR NEGATIVE NEGATIVE Final    Comment:        The GeneXpert MRSA Assay (FDA approved for NASAL specimens only), is one component of a comprehensive MRSA colonization surveillance program. It is not intended to diagnose MRSA infection nor to guide or monitor treatment for MRSA infections. Performed at [facility], [place] [address], [place], [place] [address]   Respiratory Panel by PCR     Status: None   Collection Time: 09/03/17 10:06 AM  Result Value Ref Range Status   Adenovirus NOT DETECTED NOT DETECTED Final   Coronavirus 229E NOT DETECTED NOT DETECTED Final   Coronavirus HKU1 NOT DETECTED NOT DETECTED Final   Coronavirus NL63 NOT DETECTED NOT DETECTED Final   Coronavirus OC43 NOT DETECTED NOT DETECTED Final   Metapneumovirus NOT DETECTED NOT DETECTED Final   Rhinovirus / Enterovirus NOT  DETECTED NOT DETECTED Final   Influenza A NOT DETECTED NOT DETECTED Final   Influenza B NOT DETECTED NOT DETECTED Final   Parainfluenza Virus 1 NOT DETECTED NOT DETECTED Final   Parainfluenza Virus 2 NOT DETECTED NOT DETECTED Final   Parainfluenza Virus 3 NOT DETECTED NOT DETECTED Final   Parainfluenza Virus 4 NOT DETECTED NOT DETECTED Final   Respiratory Syncytial Virus  NOT DETECTED NOT DETECTED Final   Bordetella pertussis NOT DETECTED NOT DETECTED Final   Chlamydophila pneumoniae NOT DETECTED NOT DETECTED Final   Mycoplasma pneumoniae NOT DETECTED NOT DETECTED Final    Comment: Performed at [facility], [place] [address], [place], [place] [address]         Radiology Studies: Ct Abdomen Pelvis W Contrast  Result Date: 09/02/2017 CLINICAL DATA:  Hematuria for 2 days. Diagnosed with urinary tract infection today. Hypotensive. On prednisone taper for breathing difficulties. History of CHF, cholecystectomy, hypertension, sarcoidosis. EXAM: CT ABDOMEN AND PELVIS WITH CONTRAST TECHNIQUE: Multidetector CT imaging of the abdomen and pelvis was performed using the standard protocol following bolus administration of intravenous contrast. CONTRAST:  52mL ISOVUE-300 IOPAMIDOL (ISOVUE-300) INJECTION 61% COMPARISON:  CT abdomen and pelvis December 28, 2014 inch chest radiograph September 02, 2017 FINDINGS: LOWER CHEST: Small LEFT pleural effusion with hepatization LEFT lower lobe. Associated centrilobular ground-glass nodules. Included heart size is normal. No pericardial effusion. Mild coronary artery calcifications. HEPATOBILIARY: Status post cholecystectomy.  Negative CT liver. PANCREAS: Normal. SPLEEN: Normal. ADRENALS/URINARY TRACT: Kidneys are orthotopic, demonstrating symmetric enhancement. No nephrolithiasis, hydronephrosis or solid renal masses. 2.4 cm homogeneously hypodense benign-appearing cyst LEFT interpolar kidney. Too small to characterize hypodensity upper pole RIGHT kidney. The unopacified ureters are normal in course and caliber. Delayed imaging through the kidneys demonstrates symmetric prompt contrast excretion within the proximal urinary collecting system. Urinary bladder is decompressed and unremarkable. Normal adrenal glands. STOMACH/BOWEL: The stomach, small and large bowel are normal in course and caliber without inflammatory changes. Small debris-filled  duodenal diverticulum. Moderate colonic diverticulosis. Normal appendix. VASCULAR/LYMPHATIC: Aortoiliac vessels are normal in course and caliber. Moderate calcific atherosclerosis. No lymphadenopathy by CT size criteria. REPRODUCTIVE: Normal. OTHER: No intraperitoneal free fluid or free air. New RIGHT lower quadrant fat stranding associated with large RIGHT fat containing inguinal hernia. Moderate LEFT fat containing inguinal hernia, status post herniorrhaphy. Focally thickened RIGHT pericolic gutter with calcification of prior site of inflammatory process. Small fat containing paraumbilical hernias. MUSCULOSKELETAL: Nonacute. Severe L4-5 and L5-S1 degenerative discs. Severe LEFT L4-5 and L5-S1 neural foraminal narrowing. IMPRESSION: 1. Large LEFT lower lobe consolidation consistent with pneumonia. Small LEFT pleural effusion. 2. Focal fat necrosis RIGHT pelvis associated with large fat containing RIGHT inguinal hernia. 3. No nephrolithiasis, hydronephrosis or pyelonephritis. Aortic Atherosclerosis (ICD10-I70.0). Electronically Signed   By: [place]on Alas M.D.   On: 09/02/2017 15:42   Dg Chest Port 1 View  Result Date: 09/02/2017 CLINICAL DATA:  Chest tightness, shortness of breath and nausea over the last 2 weeks. EXAM: PORTABLE CHEST 1 VIEW COMPARISON:  08/08/2017 FINDINGS: The patient is developed a left effusion with left lower lobe atelectasis and likely pneumonia. Minimal patchy density at the right lung base. Aortic atherosclerosis again noted. Upper lungs are clear. IMPRESSION: Development of left effusion and left lower lobe volume loss/pneumonia. Electronically Signed   By: Nelson Chimes M.D.   On: 09/02/2017 15:01        Scheduled Meds: . apixaban  5 mg Oral BID  . dextromethorphan-guaiFENesin  1 tablet Oral BID  . DULoxetine  30 mg Oral Daily  . finasteride  5 mg Oral Daily  . FLUoxetine  40 mg Oral Q breakfast  . ipratropium-albuterol  3 mL Nebulization TID  . methylPREDNISolone  (SOLU-MEDROL) injection  60 mg Intravenous Q12H  .  morphine injection  4 mg Intravenous Once  . sacubitril-valsartan  1 tablet Oral BID  . simvastatin  20 mg Oral QPM   Continuous Infusions: . amiodarone 30 mg/hr (09/03/17 1900)  . piperacillin-tazobactam (ZOSYN)  IV 3.375 g (09/04/17 0814)  . vancomycin Stopped (09/03/17 1929)     LOS: 2 days    Time spent: 40 minutes    Marigene Erler, Geraldo Docker, MD [facility] Pager [phone]   If 7PM-7AM, please contact night-coverage [email or web address] Password [email or web address] 09/04/2017, 8:21 AM

## 2017-09-04 NOTE — Progress Notes (Signed)
PCCM Progress Note  Admission date: 09/02/2017  CC: Short of breath  HPI: 77 yo male former smoker presented with sudden onset of dyspnea.  Found to have LLL PNA with pleural effusion.  Followed in pulmonary office for COPD, sarcoidosis, and presumed OSA.  He also had A fib with RVR, acute on chronic combined CHF.  Subjective: Still feels winded but better.  Denies chest pain.  Vital signs: BP (!) 148/89   Pulse (!) 109   Temp 98.4 F (36.9 C) (Oral)   Resp (!) 23   Ht 6' (1.829 m)   Wt 268 lb 8.3 oz (121.8 kg)   SpO2 96%   BMI 36.42 kg/m   Intake/output: I/O last 3 completed shifts: In: 3070.8 [P.O.:840; I.V.:1280.8; IV Piggyback:950] Out: 2297 [Urine:1210]  General - pleasant Eyes - pupils reactive ENT - no sinus tenderness, no oral exudate, no LAN Cardiac - regular, no murmur Chest - decreased BS Lt base Abd - soft, non tender Ext - no edema Skin - no rashes Neuro - normal strength Psych - normal mood   Labs: CMP Latest Ref Rng & Units 09/04/2017 09/03/2017 09/02/2017  Glucose 65 - 99 mg/dL 168(H) 185(H) 171(H)  BUN 6 - 20 mg/dL 19 25(H) 26(H)  Creatinine 0.61 - 1.24 mg/dL 0.90 1.07 1.45(H)  Sodium 135 - 145 mmol/L 134(L) 136 132(L)  Potassium 3.5 - 5.1 mmol/L 4.4 4.8 4.5  Chloride 101 - 111 mmol/L 106 107 100(L)  CO2 22 - 32 mmol/L 21(L) 18(L) 18(L)  Calcium 8.9 - 10.3 mg/dL 8.4(L) 8.5(L) 9.4  Total Protein 6.5 - 8.1 g/dL - - 6.6  Total Bilirubin 0.3 - 1.2 mg/dL - - 1.5(H)  Alkaline Phos 38 - 126 U/L - - 70  AST 15 - 41 U/L - - 19  ALT 17 - 63 U/L - - 17   CBC Latest Ref Rng & Units 09/04/2017 09/03/2017 09/02/2017  WBC 4.0 - 10.5 K/uL 16.8(H) 11.6(H) 21.0(H)  Hemoglobin 13.0 - 17.0 g/dL 11.7(L) 12.2(L) 15.3  Hematocrit 39.0 - 52.0 % 36.7(L) 37.4(L) 45.1  Platelets 150 - 400 K/uL 373 292 494(H)    Imaging: Ct Abdomen Pelvis W Contrast  Result Date: 09/02/2017 CLINICAL DATA:  Hematuria for 2 days. Diagnosed with urinary tract infection today.  Hypotensive. On prednisone taper for breathing difficulties. History of CHF, cholecystectomy, hypertension, sarcoidosis. EXAM: CT ABDOMEN AND PELVIS WITH CONTRAST TECHNIQUE: Multidetector CT imaging of the abdomen and pelvis was performed using the standard protocol following bolus administration of intravenous contrast. CONTRAST:  64mL ISOVUE-300 IOPAMIDOL (ISOVUE-300) INJECTION 61% COMPARISON:  CT abdomen and pelvis December 28, 2014 inch chest radiograph September 02, 2017 FINDINGS: LOWER CHEST: Small LEFT pleural effusion with hepatization LEFT lower lobe. Associated centrilobular ground-glass nodules. Included heart size is normal. No pericardial effusion. Mild coronary artery calcifications. HEPATOBILIARY: Status post cholecystectomy.  Negative CT liver. PANCREAS: Normal. SPLEEN: Normal. ADRENALS/URINARY TRACT: Kidneys are orthotopic, demonstrating symmetric enhancement. No nephrolithiasis, hydronephrosis or solid renal masses. 2.4 cm homogeneously hypodense benign-appearing cyst LEFT interpolar kidney. Too small to characterize hypodensity upper pole RIGHT kidney. The unopacified ureters are normal in course and caliber. Delayed imaging through the kidneys demonstrates symmetric prompt contrast excretion within the proximal urinary collecting system. Urinary bladder is decompressed and unremarkable. Normal adrenal glands. STOMACH/BOWEL: The stomach, small and large bowel are normal in course and caliber without inflammatory changes. Small debris-filled duodenal diverticulum. Moderate colonic diverticulosis. Normal appendix. VASCULAR/LYMPHATIC: Aortoiliac vessels are normal in course and caliber. Moderate calcific atherosclerosis. No lymphadenopathy  by CT size criteria. REPRODUCTIVE: Normal. OTHER: No intraperitoneal free fluid or free air. New RIGHT lower quadrant fat stranding associated with large RIGHT fat containing inguinal hernia. Moderate LEFT fat containing inguinal hernia, status post herniorrhaphy. Focally  thickened RIGHT pericolic gutter with calcification of prior site of inflammatory process. Small fat containing paraumbilical hernias. MUSCULOSKELETAL: Nonacute. Severe L4-5 and L5-S1 degenerative discs. Severe LEFT L4-5 and L5-S1 neural foraminal narrowing. IMPRESSION: 1. Large LEFT lower lobe consolidation consistent with pneumonia. Small LEFT pleural effusion. 2. Focal fat necrosis RIGHT pelvis associated with large fat containing RIGHT inguinal hernia. 3. No nephrolithiasis, hydronephrosis or pyelonephritis. Aortic Atherosclerosis (ICD10-I70.0). Electronically Signed   By: [place] Alas M.D.   On: 09/02/2017 15:42   Dg Chest Port 1 View  Result Date: 09/04/2017 CLINICAL DATA:  Pleural effusion EXAM: PORTABLE CHEST 1 VIEW COMPARISON:  09/02/2017 FINDINGS: Layering left pleural effusion with diffuse left lung airspace disease, slightly worsened since prior study. Heart is borderline in size. No confluent opacity on the right. Linear areas of atelectasis in the right mid and lower lung. IMPRESSION: Increasing layering left effusion and diffuse left lung airspace disease. Electronically Signed   By: Rolm Baptise M.D.   On: 09/04/2017 09:26    Assessment/plan:  Acute on chronic hypoxic respiratory failure from PNA, pleural effusion, CHF with A fib and RVR. Hx of COPD, and presumed OSA. - oxygen to keep SpO2 > 92% - Bipap qhs and prn - f/u CXR >> if effusion persists, then will need to consider thoracentesis - scheduled BDs - negative fluid balance as able - f/u Echo - ABx per primary team - hard to know how much to make out of PFT on 3/14 during acute illness >> would be better to consider repeating as outpt to get better picture of his lung function in chronic, stable state - don't think he needs trilogy home vent >> can reassess sleep disordered breathing as outpt once he is in chronic, stable state  DVT prophylaxis - eliquis SUP - not indicated Nutrition - carb modified/heart  healthy Goals of care - full code  Chesley Mires, MD [facility] 09/04/2017, 3:22 PM Pager:  [phone] After 3pm call: [phone]

## 2017-09-04 NOTE — Progress Notes (Signed)
  Echocardiogram 2D Echocardiogram has been performed.  Travis Ferguson 09/04/2017, 10:53 AM

## 2017-09-04 NOTE — Progress Notes (Addendum)
Progress Note  Patient Name: Travis Ferguson Date of Encounter: 09/04/2017  Primary Cardiologist: No primary care provider on file.   Subjective   Patient stated he was feeling much better last night early this morning when he again began to feel fatigued.  He believes this regression is attributable to the psychological realization that he is in the hospital requiring intensive care to maintain.  He no specific complaints or concerns denied palpitations, worsening dyspnea, chest pain, abdominal pain, or other concerning symptoms.  Inpatient Medications    Scheduled Meds: . apixaban  5 mg Oral BID  . dextromethorphan-guaiFENesin  1 tablet Oral BID  . DULoxetine  30 mg Oral Daily  . finasteride  5 mg Oral Daily  . FLUoxetine  40 mg Oral Q breakfast  . ipratropium-albuterol  3 mL Nebulization TID  . methylPREDNISolone (SOLU-MEDROL) injection  60 mg Intravenous Q12H  .  morphine injection  4 mg Intravenous Once  . sacubitril-valsartan  1 tablet Oral BID  . simvastatin  20 mg Oral QPM   Continuous Infusions: . amiodarone 30 mg/hr (09/03/17 1900)  . piperacillin-tazobactam (ZOSYN)  IV Stopped (09/04/17 0434)  . vancomycin Stopped (09/03/17 1929)   PRN Meds: acetaminophen, albuterol, HYDROcodone-acetaminophen, morphine injection, ondansetron (ZOFRAN) IV   Vital Signs    Vitals:   09/03/17 2100 09/03/17 2200 09/03/17 2300 09/04/17 0412  BP: 126/84 123/79 131/83 133/78  Pulse: (!) 102  (!) 103 (!) 103  Resp: 20 (!) 22 (!) 25 (!) 21  Temp:   98.5 F (36.9 C) (!) 97.5 F (36.4 C)  TempSrc:   Oral Oral  SpO2: 100%  98% 98%  Weight:    121.8 kg (268 lb 8.3 oz)  Height:        Intake/Output Summary (Last 24 hours) at 09/04/2017 0706 Last data filed at 09/04/2017 0625 Gross per 24 hour  Intake 2520.76 ml  Output 810 ml  Net 1710.76 ml   Filed Weights   09/02/17 1257 09/03/17 0406 09/04/17 0412  Weight: 117.5 kg (259 lb) 118.4 kg (261 lb 0.4 oz) 121.8 kg (268 lb 8.3 oz)     Telemetry    Multiple recurrent PVC's with runs of 3-4 noted. A single episode of SVT, not prolonged and self resolving - Personally Reviewed  ECG    N/A - Personally Reviewed  Physical Exam   GEN: No acute distress.  Non diaphoretic. Off of BiPAP on 3L via [place]. Neck: No JVD Cardiac: RRR, no murmurs, rubs, or gallops.  Heart sounds distant again today but no clear abnormality. Respiratory:  Wheezing bilaterally in upper and lower lung fields GI: Soft, nontender, non-distended  MS: No edema; No deformity. Neuro:  Nonfocal  Psych: Normal affect   Labs    Chemistry Recent Labs  Lab 09/02/17 1315 09/03/17 0535 09/04/17 0308  NA 132* 136 134*  K 4.5 4.8 4.4  CL 100* 107 106  CO2 18* 18* 21*  GLUCOSE 171* 185* 168*  BUN 26* 25* 19  CREATININE 1.45* 1.07 0.90  CALCIUM 9.4 8.5* 8.4*  PROT 6.6  --   --   ALBUMIN 2.6*  --   --   AST 19  --   --   ALT 17  --   --   ALKPHOS 70  --   --   BILITOT 1.5*  --   --   GFRNONAA 45* >60 >60  GFRAA 52* >60 >60  ANIONGAP 14 11 7      Hematology Recent Labs  Lab 09/02/17 1315 09/03/17 0535  WBC 21.0* 11.6*  RBC 5.29 4.33  HGB 15.3 12.2*  HCT 45.1 37.4*  MCV 85.3 86.4  MCH 28.9 28.2  MCHC 33.9 32.6  RDW 14.6 14.4  PLT 494* 292    Cardiac Enzymes Recent Labs  Lab 09/02/17 1732 09/02/17 2214 09/03/17 0535  TROPONINI <0.03 <0.03 <0.03    Recent Labs  Lab 09/02/17 1317  TROPIPOC 0.01     BNP Recent Labs  Lab 09/02/17 1315  BNP 184.2*     DDimer No results for input(s): DDIMER in the last 168 hours.   Radiology    Ct Abdomen Pelvis W Contrast  Result Date: 09/02/2017 CLINICAL DATA:  Hematuria for 2 days. Diagnosed with urinary tract infection today. Hypotensive. On prednisone taper for breathing difficulties. History of CHF, cholecystectomy, hypertension, sarcoidosis. EXAM: CT ABDOMEN AND PELVIS WITH CONTRAST TECHNIQUE: Multidetector CT imaging of the abdomen and pelvis was performed using the standard  protocol following bolus administration of intravenous contrast. CONTRAST:  45mL ISOVUE-300 IOPAMIDOL (ISOVUE-300) INJECTION 61% COMPARISON:  CT abdomen and pelvis December 28, 2014 inch chest radiograph September 02, 2017 FINDINGS: LOWER CHEST: Small LEFT pleural effusion with hepatization LEFT lower lobe. Associated centrilobular ground-glass nodules. Included heart size is normal. No pericardial effusion. Mild coronary artery calcifications. HEPATOBILIARY: Status post cholecystectomy.  Negative CT liver. PANCREAS: Normal. SPLEEN: Normal. ADRENALS/URINARY TRACT: Kidneys are orthotopic, demonstrating symmetric enhancement. No nephrolithiasis, hydronephrosis or solid renal masses. 2.4 cm homogeneously hypodense benign-appearing cyst LEFT interpolar kidney. Too small to characterize hypodensity upper pole RIGHT kidney. The unopacified ureters are normal in course and caliber. Delayed imaging through the kidneys demonstrates symmetric prompt contrast excretion within the proximal urinary collecting system. Urinary bladder is decompressed and unremarkable. Normal adrenal glands. STOMACH/BOWEL: The stomach, small and large bowel are normal in course and caliber without inflammatory changes. Small debris-filled duodenal diverticulum. Moderate colonic diverticulosis. Normal appendix. VASCULAR/LYMPHATIC: Aortoiliac vessels are normal in course and caliber. Moderate calcific atherosclerosis. No lymphadenopathy by CT size criteria. REPRODUCTIVE: Normal. OTHER: No intraperitoneal free fluid or free air. New RIGHT lower quadrant fat stranding associated with large RIGHT fat containing inguinal hernia. Moderate LEFT fat containing inguinal hernia, status post herniorrhaphy. Focally thickened RIGHT pericolic gutter with calcification of prior site of inflammatory process. Small fat containing paraumbilical hernias. MUSCULOSKELETAL: Nonacute. Severe L4-5 and L5-S1 degenerative discs. Severe LEFT L4-5 and L5-S1 neural foraminal narrowing.  IMPRESSION: 1. Large LEFT lower lobe consolidation consistent with pneumonia. Small LEFT pleural effusion. 2. Focal fat necrosis RIGHT pelvis associated with large fat containing RIGHT inguinal hernia. 3. No nephrolithiasis, hydronephrosis or pyelonephritis. Aortic Atherosclerosis (ICD10-I70.0). Electronically Signed   By: Elon Alas M.D.   On: 09/02/2017 15:42   Dg Chest Port 1 View  Result Date: 09/02/2017 CLINICAL DATA:  Chest tightness, shortness of breath and nausea over the last 2 weeks. EXAM: PORTABLE CHEST 1 VIEW COMPARISON:  08/08/2017 FINDINGS: The patient is developed a left effusion with left lower lobe atelectasis and likely pneumonia. Minimal patchy density at the right lung base. Aortic atherosclerosis again noted. Upper lungs are clear. IMPRESSION: Development of left effusion and left lower lobe volume loss/pneumonia. Electronically Signed   By: Nelson Chimes M.D.   On: 09/02/2017 15:01    Cardiac Studies   ECHO:05/03/2016 - Left ventricle: The cavity size was normal. There was moderate hypertrophy of the septum with mild posterior wall hypertrophy. Systolic function was mildly to moderately reduced. The estimated ejection fraction was in the range of 40%  to 45%. Wall motion was normal; there were no regional wall motion abnormalities. Doppler parameters are consistent with abnormal left ventricular relaxation (grade 1 diastolic dysfunction). - Aortic valve: Transvalvular velocity was within the normal range. There was no stenosis. There was trivial regurgitation. - Mitral valve: Transvalvular velocity was within the normal range. There was no evidence for stenosis. There was trivial regurgitation. - Left atrium: The atrium was mildly dilated. - Right ventricle: Systolic function was normal. RV systolic pressure (S, est): 29 mm Hg. - Atrial septum: No defect or patent foramen ovale was identified by color flow Doppler. - Tricuspid valve: There  was trivial regurgitation. - Pulmonic valve: There was trivial regurgitation. - Pulmonary arteries: Systolic pressure was within the normal range. PA peak pressure: 29 mm Hg (S). Impressions: - Compared with echo 10/2015, LVEF is relatively unchaged. Systolic function is difficult to assess and may be underestimated due to frequent PVCs.  CATH:01/23/2015  Ost RCA to Prox RCA lesion, 40% stenosed.  Ost LAD to Prox LAD lesion, 40% stenosed.  Ost LM to LM lesion, 40% stenosed.  There is severe left ventricular systolic dysfunction.  Patient Profile     77 y.o. male with history of hypertension, hyperlipidemia, family history of coronary artery disease, obesity, multiple sclerosis, sarcoidosis, OSA 1 PM O2, COPD, who was evaluated by cardiology for severe tachycardia at the request of Dr. Lorin Mercy.  Patient's heart rate was observed to be in the 160s when EKG however, it is unclear if this is SVT versus ST initially.  The patient is symptomatic and was unaware of the arrhythmia.  He denied palpitations, chest pain, or syncope/presyncope symptoms.  Patient heart rate on telemetry indicated multiple PVCs otherwise normal sinus rhythm.  Assessment & Plan    Atrial flutter, rapid ventricular response: Patient was given a dose of adenosine in the ED which revealed an atrial flutter pattern. The arrhythmia was most likely the result of a combination of his acute illness and chronic lung disease. Given his soft blood pressure on admission beta-blockers and CCB's were initially held.  The patient has improved symptomatically with regard to his dyspnea.  Given the improvement in his heart rate shortness of breath we will discontinue IV amiodarone. - P.o. amiodarone 200 mg twice daily - Home dose metoprolol 25 mg twice daily restarted  -Patient was started on Eliquis for anticoagulation as a result of the patient's arrhythmia. -Recommend discontinuation of the Eliquis on discharge   Chronic  systolic CHF: EF of 76-16% by echocardiogram last year.  Grade 1 diastolic dysfunction noted. I do not think his dyspnea is the result of his CHF.  Echo ordered for this admission-not yet completed Does not appear to be any current evidence of hypervolemia. Patient given mild hydration for sepsis Potassium this am 4.4, not currently being diuresed  All other medical conditions as per primary team.  PCCM continues to follow due to respiratory distress thought to be HAP in an immunocompromised patient.  For questions or updates, please contact [place] Please consult [email or web address] for contact info under Cardiology/STEMI.   Signed, Kathi Ludwig, MD  09/04/2017, 7:06 AM    Agree with noted by Dr. Berline Lopes  Patient appears to be clinically improving. His vital signs are stable. He is in sinus rhythm /sinus tachycardia with PVCs. He no longer is on BiPAP but is on nasal oxygen. Continue IV antibiotics for his left lower lobe pneumonia. We will restart him back on his oral beta blocker which was a home medication.  I anticipate stopping his Eliquis  prior to discharge. We will transition his amiodarone from IV to by mouth.  Lorretta Harp, M.D., [place], [facility], [place] [address] [place], [place]  [address]  [phone] 09/04/2017 10:41 AM

## 2017-09-04 NOTE — Progress Notes (Signed)
Pharmacy Antibiotic Note  Travis Ferguson is a 77 y.o. male admitted on 09/02/2017 with pneumonia.  Pharmacy has been consulted for vancomycin dosing.   CrCl ~11ml/min which is up from admission.  Plan: Continue Zosyn as ordered Change Vancomycin to 750mg  IV q12hr.due to improved renal function. Monitor clinical picture, renal function, VT prn F/U C&S, abx deescalation / LOT  Height: 6' (182.9 cm) Weight: 268 lb 8.3 oz (121.8 kg) IBW/kg (Calculated) : 77.6  Temp (24hrs), Avg:98 F (36.7 C), Min:97.5 F (36.4 C), Max:98.5 F (36.9 C)  Recent Labs  Lab 09/02/17 1315 09/02/17 1401 09/02/17 1618 09/02/17 2006 09/02/17 2214 09/03/17 0535 09/04/17 0308 09/04/17 0829  WBC 21.0*  --   --   --   --  11.6*  --  16.8*  CREATININE 1.45*  --   --   --   --  1.07 0.90  --   LATICACIDVEN  --  2.73* 1.43 1.3 0.8  --   --   --     Estimated Creatinine Clearance: 92.7 mL/min (by C-G formula based on SCr of 0.9 mg/dL).    Allergies  Allergen Reactions  . Statins Other (See Comments)    Muscle aches - tolerating Vytorin   Rober Minion, PharmD., MS Clinical Pharmacist Pager:  [phone] Thank you for allowing pharmacy to be part of this patients care team. 09/04/2017 10:16 AM

## 2017-09-04 NOTE — Progress Notes (Signed)
  Echocardiogram 2D Echocardiogram has been performed.  Travis Ferguson 09/04/2017, 10:54 AM

## 2017-09-05 ENCOUNTER — Inpatient Hospital Stay (HOSPITAL_COMMUNITY): Payer: Medicare Other

## 2017-09-05 LAB — GLUCOSE, CAPILLARY: Glucose-Capillary: 258 mg/dL — ABNORMAL HIGH (ref 65–99)

## 2017-09-05 LAB — BASIC METABOLIC PANEL
Anion gap: 11 (ref 5–15)
BUN: 23 mg/dL — AB (ref 6–20)
CHLORIDE: 99 mmol/L — AB (ref 101–111)
CO2: 24 mmol/L (ref 22–32)
Calcium: 8.9 mg/dL (ref 8.9–10.3)
Creatinine, Ser: 1.19 mg/dL (ref 0.61–1.24)
GFR calc Af Amer: >60 mL/min (ref >60)
GFR calc non Af Amer: 57 mL/min — ABNORMAL LOW (ref >60)
GLUCOSE: 178 mg/dL — AB (ref 65–99)
POTASSIUM: 4 mmol/L (ref 3.5–5.1)
Sodium: 134 mmol/L — ABNORMAL LOW (ref 135–145)

## 2017-09-05 LAB — URINE CULTURE: Culture: NO GROWTH

## 2017-09-05 MED ORDER — SODIUM CHLORIDE 0.9 % IV SOLN
2.0000 g | Freq: Three times a day (TID) | INTRAVENOUS | Status: DC
  Administered 2017-09-05 – 2017-09-08 (×9): 2 g via INTRAVENOUS
  Filled 2017-09-05 (×11): qty 2

## 2017-09-05 MED ORDER — HEPARIN SODIUM (PORCINE) 5000 UNIT/ML IJ SOLN
5000.0000 [IU] | Freq: Three times a day (TID) | INTRAMUSCULAR | Status: DC
  Administered 2017-09-05 – 2017-09-08 (×9): 5000 [IU] via SUBCUTANEOUS
  Filled 2017-09-05 (×9): qty 1

## 2017-09-05 NOTE — Progress Notes (Signed)
PCCM Progress Note  Admission date: 09/02/2017  CC: Short of breath  HPI: 77 yo male former smoker presented with sudden onset of dyspnea.  Found to have LLL PNA with pleural effusion.  Followed in pulmonary office for COPD, sarcoidosis, and presumed OSA.  He also had A fib with RVR, acute on chronic combined CHF.  Subjective: Off supplemental oxygen.  Slept okay with CPAP.  Vital signs: BP 127/88 (BP Location: Right Arm)   Pulse 100   Temp 97.8 F (36.6 C) (Oral)   Resp 19   Ht 6' (1.829 m)   Wt 253 lb 8.5 oz (115 kg)   SpO2 93%   BMI 34.38 kg/m   Intake/output: I/O last 3 completed shifts: In: 2837.1 [P.O.:2020; I.V.:217.1; IV Piggyback:600] Out: 6100 [Urine:6100]  General - pleasant Eyes - pupils reactive ENT - no sinus tenderness, no oral exudate, no LAN Cardiac - regular, no murmur Chest - no wheeze, rales Abd - soft, non tender Ext - no edema Skin - no rashes Neuro - normal strength Psych - normal mood   Labs: CMP Latest Ref Rng & Units 09/05/2017 09/04/2017 09/03/2017  Glucose 65 - 99 mg/dL 178(H) 168(H) 185(H)  BUN 6 - 20 mg/dL 23(H) 19 25(H)  Creatinine 0.61 - 1.24 mg/dL 1.19 0.90 1.07  Sodium 135 - 145 mmol/L 134(L) 134(L) 136  Potassium 3.5 - 5.1 mmol/L 4.0 4.4 4.8  Chloride 101 - 111 mmol/L 99(L) 106 107  CO2 22 - 32 mmol/L 24 21(L) 18(L)  Calcium 8.9 - 10.3 mg/dL 8.9 8.4(L) 8.5(L)  Total Protein 6.5 - 8.1 g/dL - - -  Total Bilirubin 0.3 - 1.2 mg/dL - - -  Alkaline Phos 38 - 126 U/L - - -  AST 15 - 41 U/L - - -  ALT 17 - 63 U/L - - -   CBC Latest Ref Rng & Units 09/04/2017 09/03/2017 09/02/2017  WBC 4.0 - 10.5 K/uL 16.8(H) 11.6(H) 21.0(H)  Hemoglobin 13.0 - 17.0 g/dL 11.7(L) 12.2(L) 15.3  Hematocrit 39.0 - 52.0 % 36.7(L) 37.4(L) 45.1  Platelets 150 - 400 K/uL 373 292 494(H)    Imaging: Dg Chest 2 View  Result Date: 09/05/2017 CLINICAL DATA:  Followup left effusion EXAM: CHEST - 2 VIEW COMPARISON:  09/04/2017 FINDINGS: Right chest remains clear.  Mild cardiomegaly as seen previously. Left effusion with left lower lobe atelectasis/pneumonia. Left upper lobe appears clear. IMPRESSION: Left effusion with left lower lobe atelectasis and/or pneumonia. Electronically Signed   By: Nelson Chimes M.D.   On: 09/05/2017 08:56   Dg Chest Port 1 View  Result Date: 09/04/2017 CLINICAL DATA:  Pleural effusion EXAM: PORTABLE CHEST 1 VIEW COMPARISON:  09/02/2017 FINDINGS: Layering left pleural effusion with diffuse left lung airspace disease, slightly worsened since prior study. Heart is borderline in size. No confluent opacity on the right. Linear areas of atelectasis in the right mid and lower lung. IMPRESSION: Increasing layering left effusion and diffuse left lung airspace disease. Electronically Signed   By: Rolm Baptise M.D.   On: 09/04/2017 09:26   Echo 09/04/17 >> mild LVH, EF 50 to 55%, grade 1 DD, PAS 47 mmHg  Assessment/plan:  Acute on chronic hypoxic respiratory failure from PNA, pleural effusion, CHF with A fib and RVR. Hx of COPD, and presumed OSA. - oxygen to keep SpO2 > 92% - CPAP qhs and f/u sleep study as outpt - negative fluid balance - f/u CXR intermittently - scheduled BDs - ABx per primary team  DVT prophylaxis -  eliquis SUP - not indicated Nutrition - carb modified/heart healthy Goals of care - full code   PCCM will f/u on Monday 09/08/17 >> call if help needed sooner.   Chesley Mires, MD [facility] Pulmonary/Critical Care 09/05/2017, 6:36 PM Pager:  [phone] After 3pm call: [phone]

## 2017-09-05 NOTE — Care Management Important Message (Signed)
Important Message  Patient Details  Name: Travis Ferguson MRN: [id number] Date of Birth: [birth year]   Medicare Important Message Given:  Yes    [name] P [name] 09/05/2017, 2:58 PM

## 2017-09-05 NOTE — Progress Notes (Addendum)
Progress Note  Patient Name: Travis Ferguson Date of Encounter: 09/05/2017  Primary Cardiologist: No primary care provider on file.  The patient pretty much improved today  Subjective   Stated that he felt significantly better than prior day.  The patient attested to urinating approximately every 15-20 minutes after he was given a diuretic.  He states that although this is an annoyance, the restriction he felt with his breathing has diminished is much more comfortable and relaxed now.  The patient was sitting up on edge of bed eating breakfast upon entering the room today.  Inpatient Medications    Scheduled Meds: . amiodarone  200 mg Oral BID  . apixaban  5 mg Oral BID  . atorvastatin  20 mg Oral q1800  . dextromethorphan-guaiFENesin  1 tablet Oral BID  . DULoxetine  30 mg Oral Daily  . finasteride  5 mg Oral Daily  . FLUoxetine  40 mg Oral Q breakfast  . furosemide  60 mg Intravenous BID  . ipratropium-albuterol  3 mL Nebulization TID  . methylPREDNISolone (SOLU-MEDROL) injection  60 mg Intravenous Q12H  . metoprolol tartrate  25 mg Oral BID  .  morphine injection  4 mg Intravenous Once  . sacubitril-valsartan  1 tablet Oral BID   Continuous Infusions: . piperacillin-tazobactam (ZOSYN)  IV Stopped (09/05/17 0415)  . vancomycin Stopped (09/04/17 2230)   PRN Meds: acetaminophen, albuterol, HYDROcodone-acetaminophen, morphine injection, ondansetron (ZOFRAN) IV   Vital Signs    Vitals:   09/04/17 2027 09/04/17 2114 09/04/17 2322 09/05/17 0307  BP: 109/69  113/82 122/71  Pulse: (!) 115 (!) 117 96 95  Resp: (!) 23  19 (!) 23  Temp: (!) 97.4 F (36.3 C)  (!) 97.4 F (36.3 C) 98.2 F (36.8 C)  TempSrc: Oral  Axillary Axillary  SpO2: 96% 95% 94% 95%  Weight:    117.7 kg (259 lb 7.7 oz)  Height:        Intake/Output Summary (Last 24 hours) at 09/05/2017 0620 Last data filed at 09/05/2017 0442 Gross per 24 hour  Intake 2030.1 ml  Output 5450 ml  Net -3419.9 ml    Filed Weights   09/03/17 0406 09/04/17 0412 09/05/17 0307  Weight: 118.4 kg (261 lb 0.4 oz) 121.8 kg (268 lb 8.3 oz) 117.7 kg (259 lb 7.7 oz)    Telemetry    Multiple PVCs normal sinus rhythm otherwise- Personally Reviewed  ECG    N/A- Personally Reviewed  Physical Exam   GEN: No acute distress.   Neck: No JVD Cardiac: RRR, no murmurs, rubs, or gallops.  Respiratory:  Right lung fields clear to auscultation however the left lower base is diminished. there is no wheezing or rhonchi GI: Soft, nontender, non-distended  MS: No edema; No deformity. Neuro:  Nonfocal  Psych: Normal affect   Labs    Chemistry Recent Labs  Lab 09/02/17 1315 09/03/17 0535 09/04/17 0308 09/05/17 0246  NA 132* 136 134* 134*  K 4.5 4.8 4.4 4.0  CL 100* 107 106 99*  CO2 18* 18* 21* 24  GLUCOSE 171* 185* 168* 178*  BUN 26* 25* 19 23*  CREATININE 1.45* 1.07 0.90 1.19  CALCIUM 9.4 8.5* 8.4* 8.9  PROT 6.6  --   --   --   ALBUMIN 2.6*  --   --   --   AST 19  --   --   --   ALT 17  --   --   --   ALKPHOS 70  --   --   --  BILITOT 1.5*  --   --   --   GFRNONAA 45* >60 >60 57*  GFRAA 52* >60 >60 >60  ANIONGAP 14 11 7 11      Hematology Recent Labs  Lab 09/02/17 1315 09/03/17 0535 09/04/17 0829  WBC 21.0* 11.6* 16.8*  RBC 5.29 4.33 4.25  HGB 15.3 12.2* 11.7*  HCT 45.1 37.4* 36.7*  MCV 85.3 86.4 86.4  MCH 28.9 28.2 27.5  MCHC 33.9 32.6 31.9  RDW 14.6 14.4 14.2  PLT 494* 292 373    Cardiac Enzymes Recent Labs  Lab 09/02/17 1732 09/02/17 2214 09/03/17 0535  TROPONINI <0.03 <0.03 <0.03    Recent Labs  Lab 09/02/17 1317  TROPIPOC 0.01     BNP Recent Labs  Lab 09/02/17 1315  BNP 184.2*     DDimer No results for input(s): DDIMER in the last 168 hours.   Radiology    Dg Chest Port 1 View  Result Date: 09/04/2017 CLINICAL DATA:  Pleural effusion EXAM: PORTABLE CHEST 1 VIEW COMPARISON:  09/02/2017 FINDINGS: Layering left pleural effusion with diffuse left lung  airspace disease, slightly worsened since prior study. Heart is borderline in size. No confluent opacity on the right. Linear areas of atelectasis in the right mid and lower lung. IMPRESSION: Increasing layering left effusion and diffuse left lung airspace disease. Electronically Signed   By: Rolm Baptise M.D.   On: 09/04/2017 09:26    Cardiac Studies   ECHO: 09/04/2017 - Left ventricle: The cavity size was normal. Wall thickness was   increased in a pattern of mild LVH. Systolic function was low   normal to mildly reduced. The estimated ejection fraction was in   the range of 50% to 55%. Images were inadequate for LV wall   motion assessment. Doppler parameters are consistent with   abnormal left ventricular relaxation (grade 1 diastolic   dysfunction). - Aortic valve: There was no stenosis. - Mitral valve: There was trivial regurgitation. - Right ventricle: The cavity size was normal. Systolic function   was normal. - Tricuspid valve: Peak RV-RA gradient (S): 39 mm Hg. - Pulmonary arteries: PA peak pressure: 47 mm Hg (S). - Systemic veins: IVC measured 2.1 cm with >50% respirophasic   variation, suggesting RA pressure 8 mmHg. - Pericardium, extracardiac: Pleural effusion noted.  Impressions:  - Technically difficult study with poor acoustic windows. Normal LV   size with mild LV hypertrophy. Low normal to mildly reduced   systolic function, EF 62-83%. Normal RV size and systolic   function. Mild pulmonary hypertension.  ECHO:05/03/2016 - Left ventricle: The cavity size was normal. There was moderate hypertrophy of the septum with mild posterior wall hypertrophy. Systolic function was mildly to moderately reduced. The estimated ejection fraction was in the range of 40% to 45%. Wall motion was normal; there were no regional wall motion abnormalities. Doppler parameters are consistent with abnormal left ventricular relaxation (grade 1 diastolic dysfunction). -  Aortic valve: Transvalvular velocity was within the normal range. There was no stenosis. There was trivial regurgitation. - Mitral valve: Transvalvular velocity was within the normal range. There was no evidence for stenosis. There was trivial regurgitation. - Left atrium: The atrium was mildly dilated. - Right ventricle: Systolic function was normal. RV systolic pressure (S, est): 29 mm Hg. - Atrial septum: No defect or patent foramen ovale was identified by color flow Doppler. - Tricuspid valve: There was trivial regurgitation. - Pulmonic valve: There was trivial regurgitation. - Pulmonary arteries: Systolic pressure was within the  normal range. PA peak pressure: 29 mm Hg (S). Impressions: - Compared with echo 10/2015, LVEF is relatively unchaged. Systolic function is difficult to assess and may be underestimated due to frequent PVCs.  CATH:01/23/2015  Ost RCA to Prox RCA lesion, 40% stenosed.  Ost LAD to Prox LAD lesion, 40% stenosed.  Ost LM to LM lesion, 40% stenosed.  There is severe left ventricular systolic dysfunction.  Patient Profile     76 y.o. male with history of hypertension, hyperlipidemia, family history of coronary artery disease, obesity, multiple sclerosis, sarcoidosis, OSA 1 PM O2, COPD, who was evaluated by cardiology for severe tachycardia at the request of Dr. Lorin Mercy. Patient's heart rate was observed to be in the 160s with EKG, however, it is unclear if this was SVT versus ST initially.  As such the patient was given a single dose of adenosine which revealed an atrial flutter pattern.  The patient was asymptomatic and was unaware of the arrhythmia. He denied palpitations, chest pain, or syncope/presyncope symptoms. Patient heart rate on telemetry indicated multiple PVCs otherwise normal sinus rhythm.  He was placed on amiodarone GTT after loading dose Eliquis.  His SVT resolved following day once patient was off BiPAP amiodarone was  switched to p.o. with recommendation to discontinue the Eliquis at discharge.  Assessment & Plan    Atrial flutter, rapid ventricular response: Patient was given a dose of pedis in the ED which revealed atrial flutter pattern.  The  arrhythmia was most likely the result of combination of his acute illness and chronic lung disease but that is since resolved with minimal intervention.  The improvement in his cardiac status thus far we have advanced treatment with his home dose of cardiac medications. -Continue oral amiodarone 200 mg twice daily -Continue home dose of metoprolol 25 mg twice daily -We have discontinued his Eliquis  Chronic systolic CHF: EF of 01-09% by echocardiogram last year.  An echocardiogram was repeated on this admission revealing an EF in the 50-55% range with a possible grade 1 diastolic dysfunction.  The aortic valve was not noted to be stenosed.  However, there is a pleural effusion noted on echo.  Patient's weight appears to be elevated at approximately 3 kg above his baseline weight following fluid resuscitation. Cr is slightly up today but lower than on admission.  -Agree with the furosemide 60 mg IV twice daily for 1-2 days or until baseline weight is attained vs Cr increase. His respiratory status improved overnight as well. -Chest x-ray with evidence or probable left lower lobe pneumonia vs atelectasis. NO signs of  Pulmonary edema consistent with florid volume overload.  -Continue metoprolol -Agree with Entresto and atorvastatin as well  All other conditions as per primary team including COPD, sarcoidosis, OSA, sepsis, and chronic pain syndrome.  Given the resolution of the patient's SVT, the improvement in his respiratory status, adequate diuresis and continued symptomatic improvement with regard to his overall status, we will sign off until otherwise needed.  Feel free to contact us again at any time.  The consult is greatly appreciated.  We would recommend you take  the amiodarone to 200 mg twice daily until he was seen by cardiology as an outpatient.  For questions or updates, please contact [place] Please consult [email or web address] for contact info under Cardiology/STEMI.     Signed, Kathi Ludwig, MD  09/05/2017, 6:20 AM    Agree with noted by Dr. Berline Lopes  Patient appears clearly improved. He is being diuresed. He is on IV antibiotics  for left lower lobe pneumonia. White count is decreasing. He remained in sinus rhythm/sinus tachycardia on by mouth amiodarone. We will discontinue his oral anticoagulant. I recommended going back on his beta blocker. We will sign off for now. I'm going to arrange fantasy mid-level provider [facility] practice 7-10 days after the discharge and with me back in 4-6 weeks after discharge.  Lorretta Harp, M.D., [place], [facility], [place] [address] [place], [place]  [id number]  [phone] 09/05/2017 10:41 AM

## 2017-09-05 NOTE — Progress Notes (Signed)
PROGRESS NOTE    [name]  XQJ:[id number] DOB: 04-Dec-1940 DOA: 09/02/2017 PCP: Crist Infante, MD     Brief Narrative:  Travis Ferguson is a 77 yo male with past medical history of sarcoidosis, OSA, COPD, HTN, chronic systolic and diastolic heart failure, chronic low back pain, PTSD presents with shortness of breath.  He stated that the episode started about 6 weeks ago, noticed pressure in his chest.  He was unable to walk in the driveway to the back of his house without stopping.  He was unable to walk in the stores, would have to sit down a lot.  He denied any significant cough or wheezing.  In the emergency department, he was extremely short of breath, tachypneic, imaging revealed left lower lobe pneumonia.  Patient was started on BiPAP due to persistent tachycardia with respiratory failure.  He was started on antibiotics.  PCCM and cardiology also consulted.  Assessment & Plan:   Principal Problem:   Left lower lobe pneumonia ([place]) Active Problems:   Obstructive sleep apnea   COPD with chronic bronchitis (HCC)   Pulmonary sarcoidosis (HCC)   Essential hypertension   Hyperlipidemia   Pleural effusion   Acute on chronic respiratory failure with hypoxia (HCC)   Sepsis (HCC)   Tachycardia   Chronic pain syndrome  Sepsis secondary to left lobar healthcare acquired pneumonia -Respiratory virus panel, strep pneumo antigen, legionella antigen negative  -Blood cultures negative to date -Treated with Vanco/Zosyn with improvement.  MRSA PCR negative, will DC Vanco. Deescalate to Cefepime monotherapy.  -Continue Solu-Medrol -PCCM following   COPD with chronic bronchitis -Continue Solu-Medrol  Acute on chronic hypoxic respiratory failure -Normally on 2 L nasal cannula at home to maintain SPO2 89-93%  Left pleural effusion -?Thoracentesis. PCCM to evaluate   Acute on chronic systolic and diastolic heart failure -Cardiology consulted and following -Continue Entresto, IV Lasix  60mg  BID for another 1-2 days   Atrial flutter, RVR -Likely a result of acute illness, chronic lung disease -Stable -Continue amiodarone 200mg  BID, lopressor  -Stop Eliquis -Follow up with cardiology as outpatient in 7-10 days after discharge and with Dr. Gwenlyn Found in 4-6 weeks   Pulmonary sarcoidosis -Follows with Dr. Halford Chessman  Hyperlipidemia -Continue lipitor 20mg  daily (instead of zocor due to interaction between amiodarone)   OSA -CPAP nightly  Depression -Continue cymbalta, prozac   DVT prophylaxis: Subq hep Code Status: Full Family Communication: Brother at bedside Disposition Plan: Pending improvement, PCCM evaluation    Consultants:   Cardiology  PCCM   Antimicrobials:  Anti-infectives (From admission, onward)   Start     Dose/Rate Route Frequency Ordered Stop   09/04/17 1030  vancomycin (VANCOCIN) IVPB 750 mg/150 ml premix  Status:  Discontinued     750 mg 150 mL/hr over 60 Minutes Intravenous Every 12 hours 09/04/17 1016 09/05/17 1337   09/03/17 1800  vancomycin (VANCOCIN) 1,250 mg in sodium chloride 0.9 % 250 mL IVPB  Status:  Discontinued     1,250 mg 166.7 mL/hr over 90 Minutes Intravenous Every 24 hours 09/02/17 1652 09/04/17 1016   09/03/17 1400  cefTRIAXone (ROCEPHIN) 1 g in sodium chloride 0.9 % 100 mL IVPB  Status:  Discontinued     1 g 200 mL/hr over 30 Minutes Intravenous Every 24 hours 09/02/17 1648 09/02/17 1648   09/03/17 1400  azithromycin (ZITHROMAX) 500 mg in sodium chloride 0.9 % 250 mL IVPB  Status:  Discontinued     500 mg 250 mL/hr over 60 Minutes Intravenous  Every 24 hours 09/02/17 1834 09/02/17 1930   09/03/17 0000  piperacillin-tazobactam (ZOSYN) IVPB 3.375 g  Status:  Discontinued     3.375 g 12.5 mL/hr over 240 Minutes Intravenous Every 8 hours 09/02/17 1937 09/05/17 1337   09/02/17 1730  vancomycin (VANCOCIN) 2,000 mg in sodium chloride 0.9 % 500 mL IVPB     2,000 mg 250 mL/hr over 120 Minutes Intravenous  Once 09/02/17 1648 09/02/17  2059   09/02/17 1700  cefTRIAXone (ROCEPHIN) 1 g in sodium chloride 0.9 % 100 mL IVPB  Status:  Discontinued     1 g 200 mL/hr over 30 Minutes Intravenous Every 24 hours 09/02/17 1648 09/02/17 1930   09/02/17 1545  cefTRIAXone (ROCEPHIN) 1 g in sodium chloride 0.9 % 100 mL IVPB  Status:  Discontinued     1 g 200 mL/hr over 30 Minutes Intravenous  Once 09/02/17 1541 09/02/17 1543   09/02/17 1545  azithromycin (ZITHROMAX) 500 mg in sodium chloride 0.9 % 250 mL IVPB     500 mg 250 mL/hr over 60 Minutes Intravenous  Once 09/02/17 1541 09/02/17 1737       Subjective: Patient reports feeling better today.  Less short of breath unable to take deeper breaths  Objective: Vitals:   09/05/17 0307 09/05/17 0650 09/05/17 0707 09/05/17 0910  BP: 122/71  129/85   Pulse: 95  100   Resp: (!) 23  (!) 26   Temp: 98.2 F (36.8 C)  98.5 F (36.9 C)   TempSrc: Axillary  Oral   SpO2: 95%  96% 95%  Weight: 117.7 kg (259 lb 7.7 oz) 115 kg (253 lb 8.5 oz)    Height:        Intake/Output Summary (Last 24 hours) at 09/05/2017 1343 Last data filed at 09/05/2017 0946 Gross per 24 hour  Intake 1380 ml  Output 3651 ml  Net -2271 ml   Filed Weights   09/04/17 0412 09/05/17 0307 09/05/17 0650  Weight: 121.8 kg (268 lb 8.3 oz) 117.7 kg (259 lb 7.7 oz) 115 kg (253 lb 8.5 oz)    Examination:  General exam: Appears calm and comfortable  Respiratory system: Decreased breath sounds on left. Respiratory effort normal.  On nasal cannula O2 Cardiovascular system: S1 & S2 heard, RRR. No JVD, murmurs, rubs, gallops or clicks. No pedal edema. Gastrointestinal system: Abdomen is nondistended, soft and nontender. No organomegaly or masses felt. Normal bowel sounds heard. Central nervous system: Alert and oriented. No focal neurological deficits. Extremities: Symmetric 5 x 5 power. Skin: No rashes, lesions or ulcers Psychiatry: Judgement and insight appear normal. Mood & affect appropriate.   Data Reviewed: I  have personally reviewed following labs and imaging studies  CBC: Recent Labs  Lab 09/02/17 1315 09/03/17 0535 09/04/17 0829  WBC 21.0* 11.6* 16.8*  NEUTROABS  --  10.3*  --   HGB 15.3 12.2* 11.7*  HCT 45.1 37.4* 36.7*  MCV 85.3 86.4 86.4  PLT 494* 292 010   Basic Metabolic Panel: Recent Labs  Lab 09/02/17 1315 09/03/17 0535 09/04/17 0308 09/04/17 0829 09/05/17 0246  NA 132* 136 134*  --  134*  K 4.5 4.8 4.4  --  4.0  CL 100* 107 106  --  99*  CO2 18* 18* 21*  --  24  GLUCOSE 171* 185* 168*  --  178*  BUN 26* 25* 19  --  23*  CREATININE 1.45* 1.07 0.90  --  1.19  CALCIUM 9.4 8.5* 8.4*  --  8.9  MG  --   --   --  1.9  --    GFR: Estimated Creatinine Clearance: 68.1 mL/min (by C-G formula based on SCr of 1.19 mg/dL). Liver Function Tests: Recent Labs  Lab 09/02/17 1315  AST 19  ALT 17  ALKPHOS 70  BILITOT 1.5*  PROT 6.6  ALBUMIN 2.6*   No results for input(s): LIPASE, AMYLASE in the last 168 hours. No results for input(s): AMMONIA in the last 168 hours. Coagulation Profile: No results for input(s): INR, PROTIME in the last 168 hours. Cardiac Enzymes: Recent Labs  Lab 09/02/17 1732 09/02/17 2214 09/03/17 0535  TROPONINI <0.03 <0.03 <0.03   BNP (last 3 results) Recent Labs    08/08/17 0958  PROBNP 92.0   HbA1C: No results for input(s): HGBA1C in the last 72 hours. CBG: No results for input(s): GLUCAP in the last 168 hours. Lipid Profile: Recent Labs    09/04/17 0308  CHOL 129  HDL 25*  LDLCALC 84  TRIG 101  CHOLHDL 5.2   Thyroid Function Tests: Recent Labs    09/02/17 2007  TSH 0.894   Anemia Panel: No results for input(s): VITAMINB12, FOLATE, FERRITIN, TIBC, IRON, RETICCTPCT in the last 72 hours. Sepsis Labs: Recent Labs  Lab 09/02/17 1401 09/02/17 1618 09/02/17 2006 09/02/17 2214  PROCALCITON  --   --  0.39  --   LATICACIDVEN 2.73* 1.43 1.3 0.8    Recent Results (from the past 240 hour(s))  Blood culture (routine x 2)      Status: None (Preliminary result)   Collection Time: 09/02/17  1:55 PM  Result Value Ref Range Status   Specimen Description BLOOD LEFT ANTECUBITAL  Final   Special Requests   Final    BOTTLES DRAWN AEROBIC AND ANAEROBIC Blood Culture adequate volume   Culture   Final    NO GROWTH 3 DAYS Performed at [facility], [place] [address], [place], [place] [address]    Report Status PENDING  Incomplete  Blood culture (routine x 2)     Status: None (Preliminary result)   Collection Time: 09/02/17  1:55 PM  Result Value Ref Range Status   Specimen Description BLOOD RIGHT ANTECUBITAL  Final   Special Requests   Final    BOTTLES DRAWN AEROBIC AND ANAEROBIC Blood Culture adequate volume   Culture   Final    NO GROWTH 3 DAYS Performed at [facility], [place] [address], [place], [place] [address]    Report Status PENDING  Incomplete  Urine culture     Status: Abnormal   Collection Time: 09/02/17  3:23 PM  Result Value Ref Range Status   Specimen Description URINE, CLEAN CATCH  Final   Special Requests   Final    NONE Performed at [facility], [place] [address], [place], [place] [address]    Culture MULTIPLE SPECIES PRESENT, SUGGEST RECOLLECTION (A)  Final   Report Status 09/03/2017 FINAL  Final  MRSA PCR Screening     Status: None   Collection Time: 09/02/17 11:14 PM  Result Value Ref Range Status   MRSA by PCR NEGATIVE NEGATIVE Final    Comment:        The GeneXpert MRSA Assay (FDA approved for NASAL specimens only), is one component of a comprehensive MRSA colonization surveillance program. It is not intended to diagnose MRSA infection nor to guide or monitor treatment for MRSA infections. Performed at [facility], [place] [address], [place], [place] [address]  Respiratory Panel by PCR     Status: None   Collection Time: 09/03/17 10:06 AM  Result Value Ref Range Status   Adenovirus NOT DETECTED NOT DETECTED Final   Coronavirus 229E NOT DETECTED NOT DETECTED  Final   Coronavirus HKU1 NOT DETECTED NOT DETECTED Final   Coronavirus NL63 NOT DETECTED NOT DETECTED Final   Coronavirus OC43 NOT DETECTED NOT DETECTED Final   Metapneumovirus NOT DETECTED NOT DETECTED Final   Rhinovirus / Enterovirus NOT DETECTED NOT DETECTED Final   Influenza A NOT DETECTED NOT DETECTED Final   Influenza B NOT DETECTED NOT DETECTED Final   Parainfluenza Virus 1 NOT DETECTED NOT DETECTED Final   Parainfluenza Virus 2 NOT DETECTED NOT DETECTED Final   Parainfluenza Virus 3 NOT DETECTED NOT DETECTED Final   Parainfluenza Virus 4 NOT DETECTED NOT DETECTED Final   Respiratory Syncytial Virus NOT DETECTED NOT DETECTED Final   Bordetella pertussis NOT DETECTED NOT DETECTED Final   Chlamydophila pneumoniae NOT DETECTED NOT DETECTED Final   Mycoplasma pneumoniae NOT DETECTED NOT DETECTED Final    Comment: Performed at [facility], [place] [address], [place], [place] [address]  Culture, Urine     Status: None   Collection Time: 09/04/17  9:31 AM  Result Value Ref Range Status   Specimen Description URINE, CLEAN CATCH  Final   Special Requests NONE  Final   Culture   Final    NO GROWTH Performed at [facility], [place] [address], [place], [place] [address]    Report Status 09/05/2017 FINAL  Final       Radiology Studies: Dg Chest 2 View  Result Date: 09/05/2017 CLINICAL DATA:  Followup left effusion EXAM: CHEST - 2 VIEW COMPARISON:  09/04/2017 FINDINGS: Right chest remains clear. Mild cardiomegaly as seen previously. Left effusion with left lower lobe atelectasis/pneumonia. Left upper lobe appears clear. IMPRESSION: Left effusion with left lower lobe atelectasis and/or pneumonia. Electronically Signed   By: Nelson Chimes M.D.   On: 09/05/2017 08:56   Dg Chest Port 1 View  Result Date: 09/04/2017 CLINICAL DATA:  Pleural effusion EXAM: PORTABLE CHEST 1 VIEW COMPARISON:  09/02/2017 FINDINGS: Layering left pleural effusion with diffuse left lung airspace disease,  slightly worsened since prior study. Heart is borderline in size. No confluent opacity on the right. Linear areas of atelectasis in the right mid and lower lung. IMPRESSION: Increasing layering left effusion and diffuse left lung airspace disease. Electronically Signed   By: Rolm Baptise M.D.   On: 09/04/2017 09:26      Scheduled Meds: . amiodarone  200 mg Oral BID  . atorvastatin  20 mg Oral q1800  . dextromethorphan-guaiFENesin  1 tablet Oral BID  . DULoxetine  30 mg Oral Daily  . finasteride  5 mg Oral Daily  . FLUoxetine  40 mg Oral Q breakfast  . furosemide  60 mg Intravenous BID  . heparin  5,000 Units Subcutaneous Q8H  . ipratropium-albuterol  3 mL Nebulization TID  . methylPREDNISolone (SOLU-MEDROL) injection  60 mg Intravenous Q12H  . metoprolol tartrate  25 mg Oral BID  . sacubitril-valsartan  1 tablet Oral BID   Continuous Infusions:    LOS: 3 days    Time spent: 30 minutes   Dessa Phi, DO [facility] [email or web address] Password [facility] 09/05/2017, 1:43 PM

## 2017-09-05 NOTE — Care Management Note (Signed)
Case Management Note  Patient Details  Name: Travis Ferguson MRN: [id number] Date of Birth: 01/05/1941  Subjective/Objective:     Pt admitted with PNA              Action/Plan:  PTA independent from home with wife.     Expected Discharge Date:                  Expected Discharge Plan:  Home/Self Care  In-House Referral:     Discharge planning Services  CM Consult  Post Acute Care Choice:    Choice offered to:     DME Arranged:    DME Agency:     HH Arranged:    HH Agency:     Status of Service:     If discussed at [employer] of Stay Meetings, dates discussed:    Additional Comments:  Maryclare Labrador, RN 09/05/2017, 9:17 AM

## 2017-09-05 NOTE — Progress Notes (Signed)
Pharmacy Antibiotic Note  Travis Ferguson is a 77 y.o. male admitted on 09/02/2017 with pneumonia.  Pharmacy has been consulted for cefepime dosing.  SCr 1.1- overall stable, CrCl >53mL/min. CXR this morning showing LLL atelectasis or PNA.  Plan: Cefepime 2g IV q8h Monitor clinical picture, renal function, de-escalation/LOT   Height: 6' (182.9 cm) Weight: 253 lb 8.5 oz (115 kg) IBW/kg (Calculated) : 77.6  Temp (24hrs), Avg:97.8 F (36.6 C), Min:97.4 F (36.3 C), Max:98.5 F (36.9 C)  Recent Labs  Lab 09/02/17 1315 09/02/17 1401 09/02/17 1618 09/02/17 2006 09/02/17 2214 09/03/17 0535 09/04/17 0308 09/04/17 0829 09/05/17 0246  WBC 21.0*  --   --   --   --  11.6*  --  16.8*  --   CREATININE 1.45*  --   --   --   --  1.07 0.90  --  1.19  LATICACIDVEN  --  2.73* 1.43 1.3 0.8  --   --   --   --     Estimated Creatinine Clearance: 68.1 mL/min (by C-G formula based on SCr of 1.19 mg/dL).    Allergies  Allergen Reactions  . Statins Other (See Comments)    Muscle aches - tolerating Vytorin   Azithro IV x 1 on 3/12 CTX x 1 on 3/12 Vanc 3/12>>3/15 Zosyn 3/12>>3/15 Cefepime 3/15>>  3/12 blood x 2 - ngtd 3/12 urine - multiple species 3/12 MRSA PCR - negative 3/12 HIV - non-reactive 3/13 resp panel- neg 3/14 urine- neg  Rease Swinson D. Janaiah Vetrano, PharmD, BCPS Clinical Pharmacist Clinical Phone for 09/05/2017 until 3:30pm: [phone] If after 3:30pm, please call main pharmacy at [phone] 09/05/2017 1:44 PM

## 2017-09-06 LAB — CBC
HCT: 38.2 % — ABNORMAL LOW (ref 39.0–52.0)
Hemoglobin: 12.5 g/dL — ABNORMAL LOW (ref 13.0–17.0)
MCH: 28 pg (ref 26.0–34.0)
MCHC: 32.7 g/dL (ref 30.0–36.0)
MCV: 85.5 fL (ref 78.0–100.0)
PLATELETS: 362 10*3/uL (ref 150–400)
RBC: 4.47 MIL/uL (ref 4.22–5.81)
RDW: 14 % (ref 11.5–15.5)
WBC: 12.9 10*3/uL — ABNORMAL HIGH (ref 4.0–10.5)

## 2017-09-06 LAB — BASIC METABOLIC PANEL
Anion gap: 12 (ref 5–15)
BUN: 27 mg/dL — ABNORMAL HIGH (ref 6–20)
CO2: 24 mmol/L (ref 22–32)
CREATININE: 1.11 mg/dL (ref 0.61–1.24)
Calcium: 8.9 mg/dL (ref 8.9–10.3)
Chloride: 99 mmol/L — ABNORMAL LOW (ref 101–111)
Glucose, Bld: 166 mg/dL — ABNORMAL HIGH (ref 65–99)
POTASSIUM: 4 mmol/L (ref 3.5–5.1)
SODIUM: 135 mmol/L (ref 135–145)

## 2017-09-06 MED ORDER — METHYLPREDNISOLONE SODIUM SUCC 125 MG IJ SOLR
60.0000 mg | INTRAMUSCULAR | Status: DC
  Administered 2017-09-07: 60 mg via INTRAVENOUS
  Filled 2017-09-06: qty 2

## 2017-09-06 NOTE — Evaluation (Signed)
Occupational Therapy Evaluation Patient Details Name: Travis Ferguson MRN: [id number] DOB: 03/05/1941 Today's Date: 09/06/2017    History of Present Illness Patient is a 77 y/o male who presents with SOB. CXR- LLL PNA with effusion. Found to have acute on chronic respiratory failure from PNA, pleural effusion, CHF with A fib and RVR. PMH includes SDH, sarcoidosis, PTSD, OSA, HTN, COPD.   Clinical Impression   PTA, pt was independent with ADL and functional mobility with occasional use of cane. Pt currently presents with dyspnea on exertion 3/4, generalized weakness, and decreased activity tolerance for ADL. He requires min guard assist for toilet transfers and LB ADL as well as supervision for standing grooming tasks. He was able to ambulate in hallway with RW for simulated IADL with desaturation to 86% on 1L/min supplemental O2 and rebound to 92% with pursed lip breathing techniques. Pt would benefit from continued OT services while admitted to improve independence with ADL and functional mobility prior to returning home. Feel he will likely progress past need for OT follow-up post-acute D/C.    Follow Up Recommendations  No OT follow up;Supervision/Assistance - 24 hour(cardiopulmonary rehabilitationo)    Equipment Recommendations  None recommended by OT    Recommendations for Other Services       Precautions / Restrictions Precautions Precautions: Fall Precaution Comments: watch 02 Restrictions Weight Bearing Restrictions: No      Mobility Bed Mobility Overal bed mobility: Needs Assistance Bed Mobility: Supine to Sit     Supine to sit: Supervision;HOB elevated     General bed mobility comments: Supervision for safety  Transfers Overall transfer level: Needs assistance Equipment used: Rolling walker (2 wheeled) Transfers: Sit to/from Stand Sit to Stand: Min guard         General transfer comment: Min guard assist for safety.     Balance Overall balance assessment:  Needs assistance Sitting-balance support: Feet supported;No upper extremity supported Sitting balance-Leahy Scale: Fair     Standing balance support: During functional activity;Bilateral upper extremity supported Standing balance-Leahy Scale: Fair Standing balance comment: Requires BUE support for dynamic standing tasks.                             ADL either performed or assessed with clinical judgement   ADL Overall ADL's : Needs assistance/impaired Eating/Feeding: Set up;Sitting   Grooming: Sitting;Supervision/safety   Upper Body Bathing: Set up;Sitting   Lower Body Bathing: Sit to/from stand;Min guard   Upper Body Dressing : Set up;Sitting   Lower Body Dressing: Sit to/from stand;Min guard   Toilet Transfer: Min guard;Ambulation;RW   Toileting- Clothing Manipulation and Hygiene: Sit to/from stand;Min guard       Functional mobility during ADLs: Min guard;Rolling walker General ADL Comments: Pt requiring min guard assist for safety with ambulating ADL tasks due to slight instability.      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; LLE and foot Pain Descriptors / Indicators: Aching;Burning Pain Intervention(s): Monitored during session;Repositioned;Premedicated before session     Hand Dominance Right   Extremity/Trunk Assessment Upper Extremity Assessment Upper Extremity Assessment: Defer to OT evaluation   Lower Extremity Assessment Lower Extremity Assessment: Generalized weakness LLE Deficits /  Details: burning sensation LLE LLE Sensation: decreased light touch       Communication Communication Communication: No difficulties   Cognition Arousal/Alertness:  Awake/alert Behavior During Therapy: WFL for tasks assessed/performed Overall Cognitive Status: Within Functional Limits for tasks assessed                                 General Comments: For basic mobility tasks but not formally assessed.    General Comments  Pt demonstrating desaturation to 86% when ambulating in hallway on 1L O2 and improved to 92% with pursed lip breathing techniques.     Exercises     Shoulder Instructions      Home Living Family/patient expects to be discharged to:: Private residence Living Arrangements: Spouse/significant other Available Help at Discharge: Family;Available 24 hours/day Type of Home: House Home Access: Stairs to enter [employer] of Steps: 1    Home Layout: One level     Bathroom Shower/Tub: Teacher, early years/pre: Handicapped height     Home Equipment: Environmental consultant - 2 wheels;Cane - single point;Bedside commode;Hand held shower head          Prior Functioning/Environment Level of Independence: Independent with assistive device(s)        Comments: Uses SPC PRN. Loves feeding birds. Reports he used to do Silver sneakers but hasnt felt like he could recently due to PNA. Walks household distances only due to SOB.        OT Problem List: Decreased strength;Decreased range of motion;Decreased activity tolerance;Impaired balance (sitting and/or standing);Decreased safety awareness;Decreased knowledge of use of DME or AE;Decreased knowledge of precautions;Pain;Cardiopulmonary status limiting activity      OT Treatment/Interventions: Self-care/ADL training;Therapeutic exercise;Energy conservation;DME and/or AE instruction;Therapeutic activities;Patient/family education;Balance training    OT Goals(Current goals can be found in the care plan section) Acute Rehab OT Goals Patient Stated Goal: to get back to working out OT Goal Formulation: With patient Time For Goal Achievement: 09/20/17 Potential to  Achieve Goals: Good ADL Goals Pt Will Perform Grooming: with modified independence;standing Pt Will Perform Lower Body Dressing: with modified independence;sit to/from stand Pt Will Transfer to Toilet: with modified independence;ambulating Pt Will Perform Toileting - Clothing Manipulation and hygiene: with modified independence;sit to/from stand Pt Will Perform Tub/Shower Transfer: with modified independence;ambulating;rolling walker Additional ADL Goal #1: Pt will verbalize 3 strategies to conserve energy during morning ADL routine.  OT Frequency: Min 2X/week   Barriers to D/C:            Co-evaluation              AM-PAC PT "6 Clicks" Daily Activity     Outcome Measure Help from another person eating meals?: A Little Help from another person taking care of personal grooming?: A Little Help from another person toileting, which includes using toliet, bedpan, or urinal?: A Little Help from another person bathing (including washing, rinsing, drying)?: A Little Help from another person to put on and taking off regular upper body clothing?: A Little Help from another person to put on and taking off regular lower body clothing?: A Little 6 Click Score: 18   End of Session Equipment Utilized During Treatment: Gait belt;Rolling walker Nurse Communication: Mobility status  Activity Tolerance: Patient tolerated treatment well Patient left: with call bell/phone within reach;in bed  OT Visit Diagnosis: Unsteadiness on feet (R26.81);Other abnormalities of gait and mobility (R26.89);Muscle weakness (generalized) (M62.81)  Time: 1219-7588 OT Time Calculation (min): 25 min Charges:  OT General Charges $OT Visit: 1 Visit OT Evaluation $OT Eval Moderate Complexity: 1 Mod OT Treatments $Self Care/Home Management : 8-22 mins G-Codes:     Norman Herrlich, MS OTR/L  Pager: [place] A Vickii Volland 09/06/2017, 6:15 PM

## 2017-09-06 NOTE — Evaluation (Signed)
Physical Therapy Evaluation Patient Details Name: Travis Ferguson MRN: [id number] DOB: 05-Oct-1940 Today's Date: 09/06/2017   History of Present Illness  Patient is a 77 y/o male who presents with SOB. CXR- LLL PNA with effusion. Found to have acute on chronic respiratory failure from PNA, pleural effusion, CHF with A fib and RVR. PMH includes SDH, sarcoidosis, PTSD, OSA, HTN, COPD.  Clinical Impression  Patient presents with dyspnea on exertion, generalized weakness, decreased cardiovascular endurance and impaired mobility s/p above. Pt with 2-3/4 DOE with Sp02 ranging from 81-91% on 2L/min 02. Pt lives with wife and she is his caregiver but she has respiratory issues as well. Pt uses SPC PRN as needed but has noticed decreased mobility recently to household distances secondary to DOE. Would like to get back to activity/mobility. Will follow acutely to maximize independence and mobility prior to return home.     Follow Up Recommendations Outpatient PT;Supervision - Intermittent(cardiopulmonary OPPT)    Equipment Recommendations  None recommended by PT    Recommendations for Other Services       Precautions / Restrictions Precautions Precautions: Fall Precaution Comments: watch 02 Restrictions Weight Bearing Restrictions: No      Mobility  Bed Mobility Overal bed mobility: Needs Assistance Bed Mobility: Supine to Sit     Supine to sit: Supervision;HOB elevated     General bed mobility comments: No assist needed, use of rail.   Transfers Overall transfer level: Needs assistance Equipment used: Rolling walker (2 wheeled) Transfers: Sit to/from Stand Sit to Stand: Min guard         General transfer comment: Min guard to steady in standing, use of momentum. Transferred from chair post ambulation.  Ambulation/Gait Ambulation/Gait assistance: Min guard Ambulation Distance (Feet): 150 Feet Assistive device: Rolling walker (2 wheeled) Gait Pattern/deviations: Step-through  pattern;Decreased stride length;Trunk flexed Gait velocity: decreased   General Gait Details: Slow, mildly unsteady gait with 2-3/4 DOE. Sp02 ranged from 81-91% on 2L/min 02. Required forced standing rest breaks with cues for pursed lip breathing. needs cues to stop talking and breathe.   Stairs            Wheelchair Mobility    Modified Rankin (Stroke Patients Only)       Balance Overall balance assessment: Needs assistance Sitting-balance support: Feet supported;No upper extremity supported Sitting balance-Leahy Scale: Fair     Standing balance support: During functional activity;Bilateral upper extremity supported Standing balance-Leahy Scale: Poor Standing balance comment: Requires BUE support in standing.                              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; LLE and foot Pain Descriptors / Indicators: Aching;Burning Pain Intervention(s): Monitored during session;Repositioned;Premedicated before session    Home Living Family/patient expects to be discharged to:: Private residence Living Arrangements: Spouse/significant other Available Help at Discharge: Family;Available 24 hours/day Type of Home: House Home Access: Stairs to enter   [employer] of Steps: 1  Home Layout: One level Home Equipment: Walker - 2 wheels;Cane - single point;Bedside commode;Hand held shower head      Prior Function Level of Independence: Independent with assistive device(s)         Comments: Uses SPC PRN. Loves feeding birds. Reports he used to do Silver sneakers but hasnt felt like he could recently due to PNA. Walks household distances only due to SOB.  Hand Dominance   Dominant Hand: Right    Extremity/Trunk Assessment   Upper Extremity Assessment Upper Extremity Assessment: Defer to OT evaluation    Lower Extremity Assessment Lower  Extremity Assessment: Generalized weakness;LLE deficits/detail LLE Deficits / Details: burning sensation LLE LLE Sensation: decreased light touch       Communication   Communication: No difficulties  Cognition Arousal/Alertness: Awake/alert Behavior During Therapy: WFL for tasks assessed/performed Overall Cognitive Status: Within Functional Limits for tasks assessed                                 General Comments: for basic mobility tasks but not formally assessed.       General Comments      Exercises     Assessment/Plan    PT Assessment Patient needs continued PT services  PT Problem List Decreased strength;Decreased mobility;Decreased activity tolerance;Cardiopulmonary status limiting activity;Pain;Decreased balance       PT Treatment Interventions Functional mobility training;Balance training;Patient/family education;Gait training;Therapeutic activities;Therapeutic exercise    PT Goals (Current goals can be found in the Care Plan section)  Acute Rehab PT Goals Patient Stated Goal: to get back to working out PT Goal Formulation: With patient Time For Goal Achievement: 09/20/17 Potential to Achieve Goals: Good    Frequency Min 3X/week   Barriers to discharge        Co-evaluation               AM-PAC PT "6 Clicks" Daily Activity  Outcome Measure Difficulty turning over in bed (including adjusting bedclothes, sheets and blankets)?: None Difficulty moving from lying on back to sitting on the side of the bed? : None Difficulty sitting down on and standing up from a chair with arms (e.g., wheelchair, bedside commode, etc,.)?: None Help needed moving to and from a bed to chair (including a wheelchair)?: None Help needed walking in hospital room?: A Little Help needed climbing 3-5 steps with a railing? : A Little 6 Click Score: 22    End of Session Equipment Utilized During Treatment: Oxygen;Gait belt Activity Tolerance: Treatment limited  secondary to medical complications (Comment)(drop in Sp02) Patient left: in chair;with call bell/phone within reach Nurse Communication: Mobility status;Other (comment)(Sp02) PT Visit Diagnosis: Pain;Other (comment);Muscle weakness (generalized) (M62.81);Difficulty in walking, not elsewhere classified (R26.2)(DOE) Pain - Right/Left: Left Pain - part of body: Shoulder;Leg    Time: 8299-3716 PT Time Calculation (min) (ACUTE ONLY): 29 min   Charges:   PT Evaluation $PT Eval Moderate Complexity: 1 Mod PT Treatments $Gait Training: 8-22 mins   PT G Codes:        Wray Kearns, PT, DPT [phone]    Marguarite Arbour A Tambria Pfannenstiel 09/06/2017, 12:48 PM

## 2017-09-06 NOTE — Progress Notes (Signed)
PROGRESS NOTE    Travis Ferguson  TKW:[id number] DOB: [birth year] DOA: 09/02/2017 PCP: Crist Infante, MD     Brief Narrative:  Travis Ferguson is a 77 yo male with past medical history of sarcoidosis, OSA, COPD, HTN, chronic systolic and diastolic heart failure, chronic low back pain, PTSD presents with shortness of breath.  He stated that the episode started about 6 weeks ago, noticed pressure in his chest.  He was unable to walk in the driveway to the back of his house without stopping.  He was unable to walk in the stores, would have to sit down a lot.  He denied any significant cough or wheezing.  In the emergency department, he was extremely short of breath, tachypneic, imaging revealed left lower lobe pneumonia.  Patient was started on BiPAP due to persistent tachycardia with respiratory failure.  He was started on antibiotics.  PCCM and cardiology also consulted.  Assessment & Plan:   Principal Problem:   Left lower lobe pneumonia ([facility]) Active Problems:   Obstructive sleep apnea   COPD with chronic bronchitis (HCC)   Pulmonary sarcoidosis (HCC)   Essential hypertension   Hyperlipidemia   Pleural effusion   Acute on chronic respiratory failure with hypoxia (HCC)   Sepsis (HCC)   Tachycardia   Chronic pain syndrome  Sepsis secondary to left lobar healthcare acquired pneumonia -Respiratory virus panel, strep pneumo antigen, legionella antigen negative  -Blood cultures negative to date -Treated with Vanco/Zosyn with improvement.  MRSA PCR negative, will DC Vanco. Now on Cefepime monotherapy.  -Continue Solu-Medrol, deescalate dose slowly  -PCCM following   COPD with chronic bronchitis -Continue Solu-Medrol, deescalate dose slowly   Acute on chronic hypoxic respiratory failure -Normally on 2 L nasal cannula at home to maintain SPO2 89-93%  Left pleural effusion -?Thoracentesis. Follow intermittent CXR, will repeat on Monday   Acute on chronic systolic and diastolic heart  failure -Cardiology consulted and following -Continue Entresto, IV Lasix 60mg  BID for another 1 day and deescalate to PO    Atrial flutter, RVR -Likely a result of acute illness, chronic lung disease -Stable -Continue amiodarone 200mg  BID, lopressor  -Stop Eliquis -Follow up with cardiology as outpatient in 7-10 days after discharge and with Dr. Gwenlyn Found in 4-6 weeks   Pulmonary sarcoidosis -Follows with Dr. Halford Chessman  Hyperlipidemia -Continue lipitor 20mg  daily (instead of zocor due to interaction between amiodarone)   OSA -CPAP nightly  Depression -Continue cymbalta, prozac   DVT prophylaxis: Subq hep Code Status: Full Family Communication: No family at bedside Disposition Plan: Pending improvement in respiratory status, PT eval    Consultants:   Cardiology  PCCM   Antimicrobials:  Anti-infectives (From admission, onward)   Start     Dose/Rate Route Frequency Ordered Stop   09/05/17 1400  ceFEPIme (MAXIPIME) 2 g in sodium chloride 0.9 % 100 mL IVPB     2 g 200 mL/hr over 30 Minutes Intravenous Every 8 hours 09/05/17 1345     09/04/17 1030  vancomycin (VANCOCIN) IVPB 750 mg/150 ml premix  Status:  Discontinued     750 mg 150 mL/hr over 60 Minutes Intravenous Every 12 hours 09/04/17 1016 09/05/17 1337   09/03/17 1800  vancomycin (VANCOCIN) 1,250 mg in sodium chloride 0.9 % 250 mL IVPB  Status:  Discontinued     1,250 mg 166.7 mL/hr over 90 Minutes Intravenous Every 24 hours 09/02/17 1652 09/04/17 1016   09/03/17 1400  cefTRIAXone (ROCEPHIN) 1 g in sodium chloride 0.9 % 100 mL  IVPB  Status:  Discontinued     1 g 200 mL/hr over 30 Minutes Intravenous Every 24 hours 09/02/17 1648 09/02/17 1648   09/03/17 1400  azithromycin (ZITHROMAX) 500 mg in sodium chloride 0.9 % 250 mL IVPB  Status:  Discontinued     500 mg 250 mL/hr over 60 Minutes Intravenous Every 24 hours 09/02/17 1834 09/02/17 1930   09/03/17 0000  piperacillin-tazobactam (ZOSYN) IVPB 3.375 g  Status:  Discontinued      3.375 g 12.5 mL/hr over 240 Minutes Intravenous Every 8 hours 09/02/17 1937 09/05/17 1337   09/02/17 1730  vancomycin (VANCOCIN) 2,000 mg in sodium chloride 0.9 % 500 mL IVPB     2,000 mg 250 mL/hr over 120 Minutes Intravenous  Once 09/02/17 1648 09/02/17 2059   09/02/17 1700  cefTRIAXone (ROCEPHIN) 1 g in sodium chloride 0.9 % 100 mL IVPB  Status:  Discontinued     1 g 200 mL/hr over 30 Minutes Intravenous Every 24 hours 09/02/17 1648 09/02/17 1930   09/02/17 1545  cefTRIAXone (ROCEPHIN) 1 g in sodium chloride 0.9 % 100 mL IVPB  Status:  Discontinued     1 g 200 mL/hr over 30 Minutes Intravenous  Once 09/02/17 1541 09/02/17 1543   09/02/17 1545  azithromycin (ZITHROMAX) 500 mg in sodium chloride 0.9 % 250 mL IVPB     500 mg 250 mL/hr over 60 Minutes Intravenous  Once 09/02/17 1541 09/02/17 1737       Subjective: Breathing is about the same. Tired of having to pee so often. No new complaints. Eating breakfast without issue.   Objective: Vitals:   09/05/17 2309 09/05/17 2333 09/06/17 0332 09/06/17 0755  BP:  97/63 (!) 112/93 135/83  Pulse: 86 89 85 87  Resp: (!) 21 (!) 23 (!) 21 20  Temp:  98.2 F (36.8 C) 98.2 F (36.8 C) 97.6 F (36.4 C)  TempSrc:  Oral Oral Oral  SpO2: 94% 95% 96% 97%  Weight:   113.7 kg (250 lb 11.2 oz)   Height:        Intake/Output Summary (Last 24 hours) at 09/06/2017 1227 Last data filed at 09/06/2017 1055 Gross per 24 hour  Intake 820 ml  Output 2625 ml  Net -1805 ml   Filed Weights   09/05/17 0307 09/05/17 0650 09/06/17 0332  Weight: 117.7 kg (259 lb 7.7 oz) 115 kg (253 lb 8.5 oz) 113.7 kg (250 lb 11.2 oz)    Examination: General exam: Appears calm and comfortable  Respiratory system: Respiratory effort normal.  Diminished breath sounds on the left.  On nasal cannula O2 Cardiovascular system: S1 & S2 heard, RRR. No JVD, murmurs, rubs, gallops or clicks. No pedal edema. Gastrointestinal system: Abdomen is nondistended, soft and  nontender. No organomegaly or masses felt. Normal bowel sounds heard. Central nervous system: Alert and oriented. No focal neurological deficits. Extremities: Symmetric 5 x 5 power. Skin: No rashes, lesions or ulcers Psychiatry: Judgement and insight appear normal. Mood & affect appropriate.    Data Reviewed: I have personally reviewed following labs and imaging studies  CBC: Recent Labs  Lab 09/02/17 1315 09/03/17 0535 09/04/17 0829 09/06/17 0254  WBC 21.0* 11.6* 16.8* 12.9*  NEUTROABS  --  10.3*  --   --   HGB 15.3 12.2* 11.7* 12.5*  HCT 45.1 37.4* 36.7* 38.2*  MCV 85.3 86.4 86.4 85.5  PLT 494* 292 373 932   Basic Metabolic Panel: Recent Labs  Lab 09/02/17 1315 09/03/17 0535 09/04/17 0308 09/04/17 6712  09/05/17 0246 09/06/17 0254  NA 132* 136 134*  --  134* 135  K 4.5 4.8 4.4  --  4.0 4.0  CL 100* 107 106  --  99* 99*  CO2 18* 18* 21*  --  24 24  GLUCOSE 171* 185* 168*  --  178* 166*  BUN 26* 25* 19  --  23* 27*  CREATININE 1.45* 1.07 0.90  --  1.19 1.11  CALCIUM 9.4 8.5* 8.4*  --  8.9 8.9  MG  --   --   --  1.9  --   --    GFR: Estimated Creatinine Clearance: 72.5 mL/min (by C-G formula based on SCr of 1.11 mg/dL). Liver Function Tests: Recent Labs  Lab 09/02/17 1315  AST 19  ALT 17  ALKPHOS 70  BILITOT 1.5*  PROT 6.6  ALBUMIN 2.6*   No results for input(s): LIPASE, AMYLASE in the last 168 hours. No results for input(s): AMMONIA in the last 168 hours. Coagulation Profile: No results for input(s): INR, PROTIME in the last 168 hours. Cardiac Enzymes: Recent Labs  Lab 09/02/17 1732 09/02/17 2214 09/03/17 0535  TROPONINI <0.03 <0.03 <0.03   BNP (last 3 results) Recent Labs    08/08/17 0958  PROBNP 92.0   HbA1C: No results for input(s): HGBA1C in the last 72 hours. CBG: Recent Labs  Lab 09/05/17 2118  GLUCAP 258*   Lipid Profile: Recent Labs    09/04/17 0308  CHOL 129  HDL 25*  LDLCALC 84  TRIG 101  CHOLHDL 5.2   Thyroid  Function Tests: No results for input(s): TSH, T4TOTAL, FREET4, T3FREE, THYROIDAB in the last 72 hours. Anemia Panel: No results for input(s): VITAMINB12, FOLATE, FERRITIN, TIBC, IRON, RETICCTPCT in the last 72 hours. Sepsis Labs: Recent Labs  Lab 09/02/17 1401 09/02/17 1618 09/02/17 2006 09/02/17 2214  PROCALCITON  --   --  0.39  --   LATICACIDVEN 2.73* 1.43 1.3 0.8    Recent Results (from the past 240 hour(s))  Blood culture (routine x 2)     Status: None (Preliminary result)   Collection Time: 09/02/17  1:55 PM  Result Value Ref Range Status   Specimen Description BLOOD LEFT ANTECUBITAL  Final   Special Requests   Final    BOTTLES DRAWN AEROBIC AND ANAEROBIC Blood Culture adequate volume   Culture   Final    NO GROWTH 3 DAYS Performed at [facility], [place] [address], [place], [place] [address]    Report Status PENDING  Incomplete  Blood culture (routine x 2)     Status: None (Preliminary result)   Collection Time: 09/02/17  1:55 PM  Result Value Ref Range Status   Specimen Description BLOOD RIGHT ANTECUBITAL  Final   Special Requests   Final    BOTTLES DRAWN AEROBIC AND ANAEROBIC Blood Culture adequate volume   Culture   Final    NO GROWTH 3 DAYS Performed at [facility], [place] [address], [place], [place] [address]    Report Status PENDING  Incomplete  Urine culture     Status: Abnormal   Collection Time: 09/02/17  3:23 PM  Result Value Ref Range Status   Specimen Description URINE, CLEAN CATCH  Final   Special Requests   Final    NONE Performed at [facility], [place] [address] [place], [place] [address]    Culture MULTIPLE SPECIES PRESENT, SUGGEST RECOLLECTION (A)  Final   Report Status 09/03/2017 FINAL  Final  MRSA PCR Screening     Status: None   Collection Time: 09/02/17 11:14 PM  Result Value Ref Range Status   MRSA by PCR NEGATIVE NEGATIVE Final    Comment:        The GeneXpert MRSA Assay (FDA approved for NASAL specimens only), is one  component of a comprehensive MRSA colonization surveillance program. It is not intended to diagnose MRSA infection nor to guide or monitor treatment for MRSA infections. Performed at [facility], [place] [address] [place], [place] [address]   Respiratory Panel by PCR     Status: None   Collection Time: 09/03/17 10:06 AM  Result Value Ref Range Status   Adenovirus NOT DETECTED NOT DETECTED Final   Coronavirus 229E NOT DETECTED NOT DETECTED Final   Coronavirus HKU1 NOT DETECTED NOT DETECTED Final   Coronavirus NL63 NOT DETECTED NOT DETECTED Final   Coronavirus OC43 NOT DETECTED NOT DETECTED Final   Metapneumovirus NOT DETECTED NOT DETECTED Final   Rhinovirus / Enterovirus NOT DETECTED NOT DETECTED Final   Influenza A NOT DETECTED NOT DETECTED Final   Influenza B NOT DETECTED NOT DETECTED Final   Parainfluenza Virus 1 NOT DETECTED NOT DETECTED Final   Parainfluenza Virus 2 NOT DETECTED NOT DETECTED Final   Parainfluenza Virus 3 NOT DETECTED NOT DETECTED Final   Parainfluenza Virus 4 NOT DETECTED NOT DETECTED Final   Respiratory Syncytial Virus NOT DETECTED NOT DETECTED Final   Bordetella pertussis NOT DETECTED NOT DETECTED Final   Chlamydophila pneumoniae NOT DETECTED NOT DETECTED Final   Mycoplasma pneumoniae NOT DETECTED NOT DETECTED Final    Comment: Performed at [facility], [place] [address], [place], [place] [address]  Culture, Urine     Status: None   Collection Time: 09/04/17  9:31 AM  Result Value Ref Range Status   Specimen Description URINE, CLEAN CATCH  Final   Special Requests NONE  Final   Culture   Final    NO GROWTH Performed at [facility], [place] [address] [place], [place] [address]    Report Status 09/05/2017 FINAL  Final       Radiology Studies: Dg Chest 2 View  Result Date: 09/05/2017 CLINICAL DATA:  Followup left effusion EXAM: CHEST - 2 VIEW COMPARISON:  09/04/2017 FINDINGS: Right chest remains clear. Mild cardiomegaly as seen  previously. Left effusion with left lower lobe atelectasis/pneumonia. Left upper lobe appears clear. IMPRESSION: Left effusion with left lower lobe atelectasis and/or pneumonia. Electronically Signed   By: Nelson Chimes M.D.   On: 09/05/2017 08:56      Scheduled Meds: . amiodarone  200 mg Oral BID  . atorvastatin  20 mg Oral q1800  . dextromethorphan-guaiFENesin  1 tablet Oral BID  . DULoxetine  30 mg Oral Daily  . finasteride  5 mg Oral Daily  . FLUoxetine  40 mg Oral Q breakfast  . furosemide  60 mg Intravenous BID  . heparin  5,000 Units Subcutaneous Q8H  . ipratropium-albuterol  3 mL Nebulization TID  . [START ON 09/07/2017] methylPREDNISolone (SOLU-MEDROL) injection  60 mg Intravenous Q24H  . metoprolol tartrate  25 mg Oral BID  . sacubitril-valsartan  1 tablet Oral BID   Continuous Infusions: . ceFEPime (MAXIPIME) IV Stopped (09/06/17 0657)     LOS: 4 days    Time spent: 20 minutes   Dessa Phi, DO [facility] [email or web address] Password [email or web address] 09/06/2017, 12:27 PM

## 2017-09-06 NOTE — Progress Notes (Signed)
Patient didn't want to wear CPAP tonight with bathroom issues. Will call if he changes his mind.

## 2017-09-07 LAB — CULTURE, BLOOD (ROUTINE X 2)
Culture: NO GROWTH
Culture: NO GROWTH
SPECIAL REQUESTS: ADEQUATE
Special Requests: ADEQUATE

## 2017-09-07 LAB — CBC
HCT: 39.9 % (ref 39.0–52.0)
Hemoglobin: 12.9 g/dL — ABNORMAL LOW (ref 13.0–17.0)
MCH: 27.5 pg (ref 26.0–34.0)
MCHC: 32.3 g/dL (ref 30.0–36.0)
MCV: 85.1 fL (ref 78.0–100.0)
PLATELETS: 355 10*3/uL (ref 150–400)
RBC: 4.69 MIL/uL (ref 4.22–5.81)
RDW: 14 % (ref 11.5–15.5)
WBC: 12.7 10*3/uL — AB (ref 4.0–10.5)

## 2017-09-07 LAB — BASIC METABOLIC PANEL
ANION GAP: 9 (ref 5–15)
BUN: 28 mg/dL — ABNORMAL HIGH (ref 6–20)
CALCIUM: 9.1 mg/dL (ref 8.9–10.3)
CO2: 29 mmol/L (ref 22–32)
Chloride: 98 mmol/L — ABNORMAL LOW (ref 101–111)
Creatinine, Ser: 1.04 mg/dL (ref 0.61–1.24)
Glucose, Bld: 121 mg/dL — ABNORMAL HIGH (ref 65–99)
Potassium: 3.5 mmol/L (ref 3.5–5.1)
Sodium: 136 mmol/L (ref 135–145)

## 2017-09-07 MED ORDER — PREDNISONE 50 MG PO TABS
50.0000 mg | ORAL_TABLET | Freq: Every day | ORAL | Status: DC
  Administered 2017-09-08: 50 mg via ORAL
  Filled 2017-09-07: qty 1

## 2017-09-07 MED ORDER — FUROSEMIDE 20 MG PO TABS
20.0000 mg | ORAL_TABLET | Freq: Two times a day (BID) | ORAL | Status: DC
  Administered 2017-09-07 – 2017-09-08 (×3): 20 mg via ORAL
  Filled 2017-09-07 (×3): qty 1

## 2017-09-07 MED ORDER — IPRATROPIUM-ALBUTEROL 0.5-2.5 (3) MG/3ML IN SOLN: 3.0000 mL | RESPIRATORY_TRACT | Status: DC | PRN

## 2017-09-07 NOTE — Progress Notes (Signed)
PROGRESS NOTE    Travis Ferguson  OKH:[id number] DOB: [birth year] DOA: 09/02/2017 PCP: Crist Infante, MD     Brief Narrative:  Travis Ferguson is a 77 yo male with past medical history of sarcoidosis, OSA, COPD, HTN, chronic systolic and diastolic heart failure, chronic low back pain, PTSD presents with shortness of breath.  He stated that the episode started about 6 weeks ago, noticed pressure in his chest.  He was unable to walk in the driveway to the back of his house without stopping.  He was unable to walk in the stores, would have to sit down a lot.  He denied any significant cough or wheezing.  In the emergency department, he was extremely short of breath, tachypneic, imaging revealed left lower lobe pneumonia.  Patient was started on BiPAP due to persistent tachycardia with respiratory failure.  He was started on antibiotics.  PCCM and cardiology also consulted.  Assessment & Plan:   Principal Problem:   Left lower lobe pneumonia ([place]) Active Problems:   Obstructive sleep apnea   COPD with chronic bronchitis (HCC)   Pulmonary sarcoidosis (HCC)   Essential hypertension   Hyperlipidemia   Pleural effusion   Acute on chronic respiratory failure with hypoxia (HCC)   Sepsis (HCC)   Tachycardia   Chronic pain syndrome  Sepsis secondary to left lobar healthcare acquired pneumonia -Respiratory virus panel, strep pneumo antigen, legionella antigen negative  -Blood cultures negative to date -Treated with Vanco/Zosyn with improvement.  MRSA PCR negative, will DC Vanco. Now on Cefepime monotherapy.  -Deescalate to oral prednisone today -PCCM following   COPD with chronic bronchitis -Deescalate to oral prednisone today  Acute on chronic hypoxic respiratory failure -Normally on 2 L nasal cannula at home to maintain SPO2 89-93%  Left pleural effusion -?Thoracentesis. Follow intermittent CXR, will repeat on Monday   Acute on chronic systolic and diastolic heart failure -Cardiology  consulted and following -Continue Entresto -Change to PO lasix today   Atrial flutter, RVR -Likely a result of acute illness, chronic lung disease -Stable -Continue amiodarone 200mg  BID, lopressor  -Stop Eliquis -Follow up with cardiology as outpatient in 7-10 days after discharge and with Dr. Gwenlyn Found in 4-6 weeks   Pulmonary sarcoidosis -Follows with Dr. Halford Chessman  Hyperlipidemia -Continue lipitor 20mg  daily (instead of zocor due to interaction between amiodarone)   OSA -CPAP nightly  Depression -Continue cymbalta, prozac   DVT prophylaxis: Subq hep Code Status: Full Family Communication: No family at bedside Disposition Plan: Pending improvement in respiratory status, possible DC home 3/18    Consultants:   Cardiology  PCCM   Antimicrobials:  Anti-infectives (From admission, onward)   Start     Dose/Rate Route Frequency Ordered Stop   09/05/17 1400  ceFEPIme (MAXIPIME) 2 g in sodium chloride 0.9 % 100 mL IVPB     2 g 200 mL/hr over 30 Minutes Intravenous Every 8 hours 09/05/17 1345     09/04/17 1030  vancomycin (VANCOCIN) IVPB 750 mg/150 ml premix  Status:  Discontinued     750 mg 150 mL/hr over 60 Minutes Intravenous Every 12 hours 09/04/17 1016 09/05/17 1337   09/03/17 1800  vancomycin (VANCOCIN) 1,250 mg in sodium chloride 0.9 % 250 mL IVPB  Status:  Discontinued     1,250 mg 166.7 mL/hr over 90 Minutes Intravenous Every 24 hours 09/02/17 1652 09/04/17 1016   09/03/17 1400  cefTRIAXone (ROCEPHIN) 1 g in sodium chloride 0.9 % 100 mL IVPB  Status:  Discontinued  1 g 200 mL/hr over 30 Minutes Intravenous Every 24 hours 09/02/17 1648 09/02/17 1648   09/03/17 1400  azithromycin (ZITHROMAX) 500 mg in sodium chloride 0.9 % 250 mL IVPB  Status:  Discontinued     500 mg 250 mL/hr over 60 Minutes Intravenous Every 24 hours 09/02/17 1834 09/02/17 1930   09/03/17 0000  piperacillin-tazobactam (ZOSYN) IVPB 3.375 g  Status:  Discontinued     3.375 g 12.5 mL/hr over 240  Minutes Intravenous Every 8 hours 09/02/17 1937 09/05/17 1337   09/02/17 1730  vancomycin (VANCOCIN) 2,000 mg in sodium chloride 0.9 % 500 mL IVPB     2,000 mg 250 mL/hr over 120 Minutes Intravenous  Once 09/02/17 1648 09/02/17 2059   09/02/17 1700  cefTRIAXone (ROCEPHIN) 1 g in sodium chloride 0.9 % 100 mL IVPB  Status:  Discontinued     1 g 200 mL/hr over 30 Minutes Intravenous Every 24 hours 09/02/17 1648 09/02/17 1930   09/02/17 1545  cefTRIAXone (ROCEPHIN) 1 g in sodium chloride 0.9 % 100 mL IVPB  Status:  Discontinued     1 g 200 mL/hr over 30 Minutes Intravenous  Once 09/02/17 1541 09/02/17 1543   09/02/17 1545  azithromycin (ZITHROMAX) 500 mg in sodium chloride 0.9 % 250 mL IVPB     500 mg 250 mL/hr over 60 Minutes Intravenous  Once 09/02/17 1541 09/02/17 1737       Subjective:  Complains of chronic left shoulder pain.  He states that it has been hurting him for 3 years.  He has seen orthopedic surgery as well as pain clinic as an outpatient.  Objective: Vitals:   09/06/17 2340 09/07/17 0300 09/07/17 0538 09/07/17 0756  BP: 96/79 97/71    Pulse: 85 84 88   Resp: 19 14 (!) 21   Temp: 97.8 F (36.6 C) 98.3 F (36.8 C)    TempSrc: Oral Oral    SpO2: 95% 96% 95% 94%  Weight:  112.4 kg (247 lb 14.4 oz)    Height:        Intake/Output Summary (Last 24 hours) at 09/07/2017 1142 Last data filed at 09/07/2017 0900 Gross per 24 hour  Intake 860 ml  Output 1850 ml  Net -990 ml   Filed Weights   09/05/17 0650 09/06/17 0332 09/07/17 0300  Weight: 115 kg (253 lb 8.5 oz) 113.7 kg (250 lb 11.2 oz) 112.4 kg (247 lb 14.4 oz)    Examination: General exam: Appears calm and comfortable  Respiratory system: Clear to auscultation. Respiratory effort normal.  Diminished breath sounds on the left Cardiovascular system: S1 & S2 heard, RRR. No JVD, murmurs, rubs, gallops or clicks. No pedal edema. Gastrointestinal system: Abdomen is nondistended, soft and nontender. No organomegaly or  masses felt. Normal bowel sounds heard. Central nervous system: Alert and oriented. No focal neurological deficits. Extremities: Symmetric 5 x 5 power. Skin: No rashes, lesions or ulcers Psychiatry: Judgement and insight appear normal. Mood & affect appropriate.   Data Reviewed: I have personally reviewed following labs and imaging studies  CBC: Recent Labs  Lab 09/02/17 1315 09/03/17 0535 09/04/17 0829 09/06/17 0254 09/07/17 0238  WBC 21.0* 11.6* 16.8* 12.9* 12.7*  NEUTROABS  --  10.3*  --   --   --   HGB 15.3 12.2* 11.7* 12.5* 12.9*  HCT 45.1 37.4* 36.7* 38.2* 39.9  MCV 85.3 86.4 86.4 85.5 85.1  PLT 494* 292 373 362 409   Basic Metabolic Panel: Recent Labs  Lab 09/03/17 0535 09/04/17  9179 09/04/17 0829 09/05/17 0246 09/06/17 0254 09/07/17 0238  NA 136 134*  --  134* 135 136  K 4.8 4.4  --  4.0 4.0 3.5  CL 107 106  --  99* 99* 98*  CO2 18* 21*  --  24 24 29   GLUCOSE 185* 168*  --  178* 166* 121*  BUN 25* 19  --  23* 27* 28*  CREATININE 1.07 0.90  --  1.19 1.11 1.04  CALCIUM 8.5* 8.4*  --  8.9 8.9 9.1  MG  --   --  1.9  --   --   --    GFR: Estimated Creatinine Clearance: 77 mL/min (by C-G formula based on SCr of 1.04 mg/dL). Liver Function Tests: Recent Labs  Lab 09/02/17 1315  AST 19  ALT 17  ALKPHOS 70  BILITOT 1.5*  PROT 6.6  ALBUMIN 2.6*   No results for input(s): LIPASE, AMYLASE in the last 168 hours. No results for input(s): AMMONIA in the last 168 hours. Coagulation Profile: No results for input(s): INR, PROTIME in the last 168 hours. Cardiac Enzymes: Recent Labs  Lab 09/02/17 1732 09/02/17 2214 09/03/17 0535  TROPONINI <0.03 <0.03 <0.03   BNP (last 3 results) Recent Labs    08/08/17 0958  PROBNP 92.0   HbA1C: No results for input(s): HGBA1C in the last 72 hours. CBG: Recent Labs  Lab 09/05/17 2118  GLUCAP 258*   Lipid Profile: No results for input(s): CHOL, HDL, LDLCALC, TRIG, CHOLHDL, LDLDIRECT in the last 72 hours. Thyroid  Function Tests: No results for input(s): TSH, T4TOTAL, FREET4, T3FREE, THYROIDAB in the last 72 hours. Anemia Panel: No results for input(s): VITAMINB12, FOLATE, FERRITIN, TIBC, IRON, RETICCTPCT in the last 72 hours. Sepsis Labs: Recent Labs  Lab 09/02/17 1401 09/02/17 1618 09/02/17 2006 09/02/17 2214  PROCALCITON  --   --  0.39  --   LATICACIDVEN 2.73* 1.43 1.3 0.8    Recent Results (from the past 240 hour(s))  Blood culture (routine x 2)     Status: None (Preliminary result)   Collection Time: 09/02/17  1:55 PM  Result Value Ref Range Status   Specimen Description BLOOD LEFT ANTECUBITAL  Final   Special Requests   Final    BOTTLES DRAWN AEROBIC AND ANAEROBIC Blood Culture adequate volume   Culture   Final    NO GROWTH 4 DAYS Performed at [facility], [place] [address], [place], [place] [address]    Report Status PENDING  Incomplete  Blood culture (routine x 2)     Status: None (Preliminary result)   Collection Time: 09/02/17  1:55 PM  Result Value Ref Range Status   Specimen Description BLOOD RIGHT ANTECUBITAL  Final   Special Requests   Final    BOTTLES DRAWN AEROBIC AND ANAEROBIC Blood Culture adequate volume   Culture   Final    NO GROWTH 4 DAYS Performed at [facility], [place] [address] [place], [place] [address]    Report Status PENDING  Incomplete  Urine culture     Status: Abnormal   Collection Time: 09/02/17  3:23 PM  Result Value Ref Range Status   Specimen Description URINE, CLEAN CATCH  Final   Special Requests   Final    NONE Performed at [facility], [place] [address], [place], [place] [address]    Culture MULTIPLE SPECIES PRESENT, SUGGEST RECOLLECTION (A)  Final   Report Status 09/03/2017 FINAL  Final  MRSA PCR Screening  Status: None   Collection Time: 09/02/17 11:14 PM  Result Value Ref Range Status   MRSA by PCR NEGATIVE NEGATIVE Final    Comment:        The GeneXpert MRSA Assay (FDA approved for NASAL specimens only), is one  component of a comprehensive MRSA colonization surveillance program. It is not intended to diagnose MRSA infection nor to guide or monitor treatment for MRSA infections. Performed at [facility], [place] [address], [place], [place] [address]   Respiratory Panel by PCR     Status: None   Collection Time: 09/03/17 10:06 AM  Result Value Ref Range Status   Adenovirus NOT DETECTED NOT DETECTED Final   Coronavirus 229E NOT DETECTED NOT DETECTED Final   Coronavirus HKU1 NOT DETECTED NOT DETECTED Final   Coronavirus NL63 NOT DETECTED NOT DETECTED Final   Coronavirus OC43 NOT DETECTED NOT DETECTED Final   Metapneumovirus NOT DETECTED NOT DETECTED Final   Rhinovirus / Enterovirus NOT DETECTED NOT DETECTED Final   Influenza A NOT DETECTED NOT DETECTED Final   Influenza B NOT DETECTED NOT DETECTED Final   Parainfluenza Virus 1 NOT DETECTED NOT DETECTED Final   Parainfluenza Virus 2 NOT DETECTED NOT DETECTED Final   Parainfluenza Virus 3 NOT DETECTED NOT DETECTED Final   Parainfluenza Virus 4 NOT DETECTED NOT DETECTED Final   Respiratory Syncytial Virus NOT DETECTED NOT DETECTED Final   Bordetella pertussis NOT DETECTED NOT DETECTED Final   Chlamydophila pneumoniae NOT DETECTED NOT DETECTED Final   Mycoplasma pneumoniae NOT DETECTED NOT DETECTED Final    Comment: Performed at [facility], [place] [address], [place], [place] [address]  Culture, Urine     Status: None   Collection Time: 09/04/17  9:31 AM  Result Value Ref Range Status   Specimen Description URINE, CLEAN CATCH  Final   Special Requests NONE  Final   Culture   Final    NO GROWTH Performed at [facility], [place] [address] [place], [place] [address]    Report Status 09/05/2017 FINAL  Final       Radiology Studies: No results found.    Scheduled Meds: . amiodarone  200 mg Oral BID  . atorvastatin  20 mg Oral q1800  . dextromethorphan-guaiFENesin  1 tablet Oral BID  . DULoxetine  30 mg Oral Daily  .  finasteride  5 mg Oral Daily  . FLUoxetine  40 mg Oral Q breakfast  . furosemide  20 mg Oral BID  . heparin  5,000 Units Subcutaneous Q8H  . metoprolol tartrate  25 mg Oral BID  . [START ON 09/08/2017] predniSONE  50 mg Oral Q breakfast  . sacubitril-valsartan  1 tablet Oral BID   Continuous Infusions: . ceFEPime (MAXIPIME) IV Stopped (09/07/17 0700)     LOS: 5 days    Time spent: 20 minutes   Dessa Phi, DO [facility] [email or web address] Password [facility] 09/07/2017, 11:42 AM

## 2017-09-07 NOTE — Progress Notes (Signed)
Patient is refusing the use of CPAP for tonight. RT informed patient if he changes his mind have RN contact RT. 

## 2017-09-08 ENCOUNTER — Inpatient Hospital Stay (HOSPITAL_COMMUNITY): Payer: Medicare Other

## 2017-09-08 DIAGNOSIS — G4733 Obstructive sleep apnea (adult) (pediatric): Secondary | ICD-10-CM

## 2017-09-08 LAB — BASIC METABOLIC PANEL
Anion gap: 8 (ref 5–15)
BUN: 31 mg/dL — AB (ref 6–20)
CALCIUM: 8.8 mg/dL — AB (ref 8.9–10.3)
CHLORIDE: 98 mmol/L — AB (ref 101–111)
CO2: 28 mmol/L (ref 22–32)
CREATININE: 1.03 mg/dL (ref 0.61–1.24)
GFR calc Af Amer: >60 mL/min (ref >60)
GFR calc non Af Amer: >60 mL/min (ref >60)
GLUCOSE: 162 mg/dL — AB (ref 65–99)
Potassium: 4.2 mmol/L (ref 3.5–5.1)
Sodium: 134 mmol/L — ABNORMAL LOW (ref 135–145)

## 2017-09-08 LAB — CBC
HEMATOCRIT: 39.9 % (ref 39.0–52.0)
HEMOGLOBIN: 13.1 g/dL (ref 13.0–17.0)
MCH: 28 pg (ref 26.0–34.0)
MCHC: 32.8 g/dL (ref 30.0–36.0)
MCV: 85.3 fL (ref 78.0–100.0)
Platelets: 367 10*3/uL (ref 150–400)
RBC: 4.68 MIL/uL (ref 4.22–5.81)
RDW: 14.2 % (ref 11.5–15.5)
WBC: 16.7 10*3/uL — ABNORMAL HIGH (ref 4.0–10.5)

## 2017-09-08 MED ORDER — FUROSEMIDE 20 MG PO TABS: 20.0000 mg | ORAL_TABLET | Freq: Two times a day (BID) | ORAL | 0 refills | Status: DC

## 2017-09-08 MED ORDER — ATORVASTATIN CALCIUM 20 MG PO TABS: 20.0000 mg | ORAL_TABLET | Freq: Every day | ORAL | 0 refills | Status: DC

## 2017-09-08 MED ORDER — PREDNISONE 10 MG PO TABS: ORAL_TABLET | ORAL | 0 refills | Status: DC

## 2017-09-08 MED ORDER — AMIODARONE HCL 200 MG PO TABS: 200.0000 mg | ORAL_TABLET | Freq: Two times a day (BID) | ORAL | 0 refills | Status: DC

## 2017-09-08 NOTE — Care Management Important Message (Signed)
Important Message  Patient Details  Name: ADRIENNE DELAY MRN: [id number] Date of Birth: Sep 14, 1940   Medicare Important Message Given:  Yes    Ananiah Maciolek P Monzerat Handler 09/08/2017, 1:42 PM

## 2017-09-08 NOTE — Progress Notes (Signed)
PCCM Progress Note  Admission date: 09/02/2017  CC: Short of breath  HPI: 77 yo male former smoker presented with sudden onset of dyspnea.  Found to have LLL PNA with pleural effusion.  Followed in pulmonary office for COPD, sarcoidosis, and presumed OSA.  He also had A fib with RVR, acute on chronic combined CHF.  Subjective: Off O2,, no complaints overnight  Vital signs: BP 123/81 (BP Location: Left Arm)   Pulse 80   Temp 97.6 F (36.4 C) (Oral)   Resp 20   Ht 6' (1.829 m)   Wt 248 lb 7.3 oz (112.7 kg)   SpO2 95%   BMI 33.70 kg/m   Intake/output: I/O last 3 completed shifts: In: 64 [P.O.:1260; IV Piggyback:300] Out: 2300 [Urine:2300]  General - pleasant, well appearing elderly male in NAD Eyes - PERRL, EOM-I ENT - -LAN Cardiac - RRR, NL S1/S2 and -M/R/G. Chest - CTA bilaterally Abd - Soft, NT, ND and +BS Ext - -edema and -tenderness Skin - Intact Neuro - Alert and interactive, moving all ext to command  Labs: CMP Latest Ref Rng & Units 09/08/2017 09/07/2017 09/06/2017  Glucose 65 - 99 mg/dL 162(H) 121(H) 166(H)  BUN 6 - 20 mg/dL 31(H) 28(H) 27(H)  Creatinine 0.61 - 1.24 mg/dL 1.03 1.04 1.11  Sodium 135 - 145 mmol/L 134(L) 136 135  Potassium 3.5 - 5.1 mmol/L 4.2 3.5 4.0  Chloride 101 - 111 mmol/L 98(L) 98(L) 99(L)  CO2 22 - 32 mmol/L 28 29 24   Calcium 8.9 - 10.3 mg/dL 8.8(L) 9.1 8.9  Total Protein 6.5 - 8.1 g/dL - - -  Total Bilirubin 0.3 - 1.2 mg/dL - - -  Alkaline Phos 38 - 126 U/L - - -  AST 15 - 41 U/L - - -  ALT 17 - 63 U/L - - -   CBC Latest Ref Rng & Units 09/08/2017 09/07/2017 09/06/2017  WBC 4.0 - 10.5 K/uL 16.7(H) 12.7(H) 12.9(H)  Hemoglobin 13.0 - 17.0 g/dL 13.1 12.9(L) 12.5(L)  Hematocrit 39.0 - 52.0 % 39.9 39.9 38.2(L)  Platelets 150 - 400 K/uL 367 355 362    Imaging: Dg Chest 2 View  Result Date: 09/08/2017 CLINICAL DATA:  Follow-up pleural effusion. EXAM: CHEST - 2 VIEW COMPARISON:  09/05/2017 FINDINGS: The heart is borderline enlarged but  stable. The mediastinal and hilar contours are stable. There is a persistent small left effusion and overlying atelectasis. IMPRESSION: Persistent small left effusion and overlying atelectasis. Electronically Signed   By: Marijo Sanes M.D.   On: 09/08/2017 08:42   Echo 09/04/17 >> mild LVH, EF 50 to 55%, grade 1 DD, PAS 47 mmHg  I reviewed CXR myself, atelectasis and small pleural effusion noted  Assessment/plan:  Hypoxemia: resolved  - D/C O2  - Will check as outpatient  Pneumonia  - Abx course complete  - Follow clinically  COPD exacerbation  - Prednisone taper upon discharge  - Bronchodilators at home dose  OSA:  - CPAP  - F/u with Dr Halford Chessman  PCCM will sign off, please call back if needed  Rush Farmer, M.D. [facility] Pulmonary/Critical Care Medicine. Pager: [phone]. After hours pager: [phone].

## 2017-09-08 NOTE — Progress Notes (Signed)
Physical Therapy Treatment Patient Details Name: Travis Ferguson MRN: [id number] DOB: 12-Jun-1941 Today's Date: 09/08/2017    History of Present Illness Patient is a 77 y/o male who presents with SOB. CXR- LLL PNA with effusion. Found to have acute on chronic respiratory failure from PNA, pleural effusion, CHF with A fib and RVR. PMH includes SDH, sarcoidosis, PTSD, OSA, HTN, COPD.    PT Comments    Pt performed increased gait and functional mobility with improved respiratory function.  Pt remains slow and guarded and continues to fatigue.  SPO2 ranged from 88%-93% on RA.  When SPO2 dropped it would quickly return to 93%.  Pt required cues for pacing and rest breaks.  Pt issued and educated on incentive spirometer.  Pt with 79ml quality and educated to performed hourly when awake.   Follow Up Recommendations  Outpatient PT;Supervision - Intermittent(OPPT Cardiopulmonary rehab.  )     Equipment Recommendations  None recommended by PT    Recommendations for Other Services       Precautions / Restrictions Precautions Precautions: Fall Precaution Comments: watch 02 Restrictions Weight Bearing Restrictions: No    Mobility  Bed Mobility Overal bed mobility: Needs Assistance Bed Mobility: Supine to Sit     Supine to sit: Supervision;HOB elevated     General bed mobility comments: Supervision for safety  Transfers Overall transfer level: Needs assistance   Transfers: Sit to/from Stand Sit to Stand: Supervision         General transfer comment: Min guard assist for safety. Cues for hand placement.    Ambulation/Gait Ambulation/Gait assistance: Supervision(close)  200 ft.   Assistive device: Rolling walker (2 wheeled) Gait Pattern/deviations: Step-through pattern;Decreased stride length;Trunk flexed Gait velocity: decreased Gait velocity interpretation: Below normal speed for age/gender General Gait Details: Pt required cues for forward gaze, rest breaks and pacing.   Pt with brief drops in O2 sats but quickly returns to 93% on RA with rest breaks. Pt lowest desat 88% on RA but returns above 90% before needing to apply suppmlemental O2.     Stairs            Wheelchair Mobility    Modified Rankin (Stroke Patients Only)       Balance     Sitting balance-Leahy Scale: Fair       Standing balance-Leahy Scale: Fair                              Cognition Arousal/Alertness: Awake/alert Behavior During Therapy: WFL for tasks assessed/performed Overall Cognitive Status: Within Functional Limits for tasks assessed                                 General Comments: For basic mobility tasks but not formally assessed.       Exercises      General Comments        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;  Pain Descriptors / Indicators: Aching;Burning Pain Intervention(s): Monitored during session;Limited activity within patient's tolerance    Home Living                      Prior Function            PT Goals (current goals can now be found in the care plan section) Acute Rehab PT  Goals Patient Stated Goal: to get back to working out Potential to Achieve Goals: Good Progress towards PT goals: Progressing toward goals    Frequency    Min 3X/week      PT Plan Current plan remains appropriate    Co-evaluation              AM-PAC PT "6 Clicks" Daily Activity  Outcome Measure  Difficulty turning over in bed (including adjusting bedclothes, sheets and blankets)?: None Difficulty moving from lying on back to sitting on the side of the bed? : None Difficulty sitting down on and standing up from a chair with arms (e.g., wheelchair, bedside commode, etc,.)?: A Lot Help needed moving to and from a bed to chair (including a wheelchair)?: A Little Help needed walking in hospital room?: A Little Help  needed climbing 3-5 steps with a railing? : A Little 6 Click Score: 19    End of Session Equipment Utilized During Treatment: Oxygen;Gait belt Activity Tolerance: Patient tolerated treatment well Patient left: in chair;with call bell/phone within reach Nurse Communication: Mobility status(Informed nursing of SPO2 on RA during tx.  ) PT Visit Diagnosis: Pain;Other (comment);Muscle weakness (generalized) (M62.81);Difficulty in walking, not elsewhere classified (R26.2)(DOE.  ) Pain - Right/Left: Left Pain - part of body: Shoulder;Leg     Time: 8295-6213 PT Time Calculation (min) (ACUTE ONLY): 23 min  Charges:  $Gait Training: 8-22 mins $Therapeutic Activity: 8-22 mins                    G Codes:       Governor Rooks, PTA pager [phone]    Cristela Blue 09/08/2017, 11:48 AM

## 2017-09-08 NOTE — Discharge Summary (Signed)
Physician Discharge Summary  Travis Ferguson:[id number] DOB: 1940-09-25 DOA: 09/02/2017  PCP: Crist Infante, MD  Admit date: 09/02/2017 Discharge date: 09/08/2017  Admitted From: Home Disposition:  Home  Recommendations for Outpatient Follow-up:  1. Follow up with PCP in 1 week 2. Follow up with cardiology as outpatient on 3/21 and with Dr. Gwenlyn Found on 4/30 3. Follow up with Dr. Sood/Pulmonology in 1 month  4. Please obtain CBC once off steroids to ensure resolution of leukocytosis   Discharge Condition: Stable CODE STATUS: Full  Diet recommendation: Heart healthy   Brief/Interim Summary: Travis Ferguson is a 77 yo male with past medical history of sarcoidosis, OSA, COPD, HTN, chronic systolic and diastolic heart failure, chronic low back pain, PTSD presents with shortness of breath.  He stated that the episode started about 6 weeks ago, noticed pressure in his chest.  He was unable to walk in the driveway to the back of his house without stopping.  He was unable to walk in the stores, would have to sit down a lot.  He denied any significant cough or wheezing.  In the emergency department, he was extremely short of breath, tachypneic, imaging revealed left lower lobe pneumonia.  Patient was started on BiPAP due to persistent tachycardia with respiratory failure.  He was started on antibiotics.  PCCM and cardiology also consulted. He was diuresed with IV lasix and continued to improve. He was also treated for HCAP with IV vanco/zosyn transitioned to cefepime and completed total 7 day treatment. He was treated with IV solumedrol and deescalated to PO prednisone taper. Patient's respiratory status continued to improve slowly throughout hospitalization. Repeat CXR revealed small pleural effusion, which is an improvement from previous exams. On day of discharge, he had no complaints of chest pain or shortness of breath. Discharge plan discussed with him and he was eager to go home.   Discharge Diagnoses:   Principal Problem:   Left lower lobe pneumonia ([place]) Active Problems:   Obstructive sleep apnea   COPD with chronic bronchitis (HCC)   Pulmonary sarcoidosis (HCC)   Essential hypertension   Hyperlipidemia   Pleural effusion   Acute on chronic respiratory failure with hypoxia (HCC)   Sepsis (HCC)   Tachycardia   Chronic pain syndrome   Sepsis secondary to left lobar healthcare acquired pneumonia -Respiratory virus panel, strep pneumo antigen, legionella antigen negative  -Blood cultures negative to date -Treated with Vanco/Zosyn with improvement --> Cefepime. Completed total 7 day treatment.  -Deescalate to oral prednisone, taper at discharge  -Follow up with Dr. Halford Chessman   COPD with chronic bronchitis -Deescalate to oral prednisone, taper at discharge   Acute on chronic hypoxic respiratory failure -Normally on 2 L nasal cannula at home to maintain SPO2 89-93%  Left pleural effusion -Repeat CXR 3/18 reviewed, persistent small left effusion. No indication for thoracentesis at this time.   Acute on chronic systolic and diastolic heart failure -Cardiology consulted and following -Continue Entresto -PO lasix    Atrial flutter, RVR -Likely a result of acute illness, chronic lung disease -Continue amiodarone 200mg  BID, lopressor  -Stop Eliquis -Follow up with cardiology as outpatient in 7-10 days after discharge and with Dr. Gwenlyn Found in 4-6 weeks   Pulmonary sarcoidosis -Follows with Dr. Halford Chessman  Hyperlipidemia -Continue lipitor 20mg  daily (instead of zocor due to interaction between amiodarone)   OSA -CPAP nightly  Depression -Continue cymbalta, prozac   Chronic left shoulder pain -Follow up as outpatient   Discharge Instructions  Discharge Instructions    (  HEART FAILURE PATIENTS) Call MD:  Anytime you have any of the following symptoms: 1) 3 pound weight gain in 24 hours or 5 pounds in 1 week 2) shortness of breath, with or without a dry hacking cough 3)  swelling in the hands, feet or stomach 4) if you have to sleep on extra pillows at night in order to breathe.   Complete by:  As directed    Call MD for:  difficulty breathing, headache or visual disturbances   Complete by:  As directed    Call MD for:  extreme fatigue   Complete by:  As directed    Call MD for:  persistant dizziness or light-headedness   Complete by:  As directed    Call MD for:  persistant nausea and vomiting   Complete by:  As directed    Call MD for:  severe uncontrolled pain   Complete by:  As directed    Call MD for:  temperature >100.4   Complete by:  As directed    Diet - low sodium heart healthy   Complete by:  As directed    Discharge instructions   Complete by:  As directed    You were cared for by a hospitalist during your hospital stay. If you have any questions about your discharge medications or the care you received while you were in the hospital after you are discharged, you can call the unit and asked to speak with the hospitalist on call if the hospitalist that took care of you is not available. Once you are discharged, your primary care physician will handle any further medical issues. Please note that NO REFILLS for any discharge medications will be authorized once you are discharged, as it is imperative that you return to your primary care physician (or establish a relationship with a primary care physician if you do not have one) for your aftercare needs so that they can reassess your need for medications and monitor your lab values.   Increase activity slowly   Complete by:  As directed      Allergies as of 09/08/2017      Reactions   Statins Other (See Comments)   Muscle aches - tolerating Vytorin      Medication List    STOP taking these medications   simvastatin 20 MG tablet Commonly known as:  ZOCOR   spironolactone 25 MG tablet Commonly known as:  ALDACTONE     TAKE these medications   albuterol 108 (90 Base) MCG/ACT  inhaler Commonly known as:  PROAIR HFA 1-2 PUFFS EVERY 4-6 HOURS AS NEEDED for shortness of breath   amiodarone 200 MG tablet Commonly known as:  PACERONE Take 1 tablet (200 mg total) by mouth 2 (two) times daily.   atorvastatin 20 MG tablet Commonly known as:  LIPITOR Take 1 tablet (20 mg total) by mouth daily at 6 PM.   DULoxetine 30 MG capsule Commonly known as:  CYMBALTA Take 30 mg by mouth daily.   ENTRESTO 24-26 MG Generic drug:  sacubitril-valsartan TAKE 1 TABLET BY MOUTH 2 (TWO) TIMES DAILY.   finasteride 5 MG tablet Commonly known as:  PROSCAR Take 5 mg by mouth daily.   FLUoxetine 40 MG capsule Commonly known as:  PROZAC Take 40 mg by mouth daily with breakfast.   furosemide 20 MG tablet Commonly known as:  LASIX Take 1 tablet (20 mg total) by mouth 2 (two) times daily. What changed:  when to take this   HYDROcodone-acetaminophen  5-325 MG tablet Commonly known as:  NORCO/VICODIN Take 1 tablet by mouth every 8 (eight) hours as needed for moderate pain.   ipratropium 0.02 % nebulizer solution Commonly known as:  ATROVENT Take 2.5 mLs (0.5 mg total) by nebulization 4 (four) times daily.   metoprolol tartrate 25 MG tablet Commonly known as:  LOPRESSOR TAKE 1 TABLET (25 MG TOTAL) BY MOUTH 2 (TWO) TIMES DAILY.   multivitamin tablet Take 1 tablet by mouth daily.   OXYGEN Inhale 2 L/min into the lungs at bedtime.   predniSONE 10 MG tablet Commonly known as:  DELTASONE Take 4 tabs for 3 days, then 3 tabs for 3 days, then 2 tabs for 3 days, then 1 tab for 3 days, then 1/2 tab for 4 days. What changed:  additional instructions   sodium chloride 0.65 % Soln nasal spray Commonly known as:  OCEAN Place 1-2 sprays into both nostrils daily as needed for congestion.   SYMBICORT 160-4.5 MCG/ACT inhaler Generic drug:  budesonide-formoterol INHALE 2 PUFFS INTO THE LUNGS 2 (TWO) TIMES DAILY.   budesonide-formoterol 160-4.5 MCG/ACT inhaler Commonly known as:   SYMBICORT Inhale 2 puffs into the lungs 2 (two) times daily.      Follow-up Information    Crist Infante, MD. Schedule an appointment as soon as possible for a visit in 1 week(s).   Specialty:  Internal Medicine Contact information: [place] [address] [phone]        Lorretta Harp, MD. Go on 10/21/2017.   Specialties:  Cardiology, Radiology Contact information: [address] [place] [address] [phone]        Chesley Mires, MD. Schedule an appointment as soon as possible for a visit in 1 month(s).   Specialty:  Pulmonary Disease Contact information: [address] [place] [address] [phone]        Almyra Deforest, [place]. Go on 09/11/2017.   Specialties:  Cardiology, Radiology Contact information: [address] [place] [address] [phone]          Allergies  Allergen Reactions  . Statins Other (See Comments)    Muscle aches - tolerating Vytorin    Consultations:  Cardiology  PCCM    Procedures/Studies: Dg Chest 2 View  Result Date: 09/08/2017 CLINICAL DATA:  Follow-up pleural effusion. EXAM: CHEST - 2 VIEW COMPARISON:  09/05/2017 FINDINGS: The heart is borderline enlarged but stable. The mediastinal and hilar contours are stable. There is a persistent small left effusion and overlying atelectasis. IMPRESSION: Persistent small left effusion and overlying atelectasis. Electronically Signed   By: Marijo Sanes M.D.   On: 09/08/2017 08:42   Dg Chest 2 View  Result Date: 09/05/2017 CLINICAL DATA:  Followup left effusion EXAM: CHEST - 2 VIEW COMPARISON:  09/04/2017 FINDINGS: Right chest remains clear. Mild cardiomegaly as seen previously. Left effusion with left lower lobe atelectasis/pneumonia. Left upper lobe appears clear. IMPRESSION: Left effusion with left lower lobe atelectasis and/or pneumonia. Electronically Signed   By: Nelson Chimes M.D.   On: 09/05/2017 08:56   Ct Abdomen Pelvis W  Contrast  Result Date: 09/02/2017 CLINICAL DATA:  Hematuria for 2 days. Diagnosed with urinary tract infection today. Hypotensive. On prednisone taper for breathing difficulties. History of CHF, cholecystectomy, hypertension, sarcoidosis. EXAM: CT ABDOMEN AND PELVIS WITH CONTRAST TECHNIQUE: Multidetector CT imaging of the abdomen and pelvis was performed using the standard protocol following bolus administration of intravenous contrast. CONTRAST:  40mL ISOVUE-300 IOPAMIDOL (ISOVUE-300) INJECTION 61% COMPARISON:  CT abdomen and pelvis December 28, 2014 inch chest radiograph September 02, 2017 FINDINGS: LOWER CHEST: Small LEFT pleural effusion with hepatization LEFT lower lobe. Associated centrilobular ground-glass nodules. Included heart size is normal. No pericardial effusion. Mild coronary artery calcifications. HEPATOBILIARY: Status post cholecystectomy.  Negative CT liver. PANCREAS: Normal. SPLEEN: Normal. ADRENALS/URINARY TRACT: Kidneys are orthotopic, demonstrating symmetric enhancement. No nephrolithiasis, hydronephrosis or solid renal masses. 2.4 cm homogeneously hypodense benign-appearing cyst LEFT interpolar kidney. Too small to characterize hypodensity upper pole RIGHT kidney. The unopacified ureters are normal in course and caliber. Delayed imaging through the kidneys demonstrates symmetric prompt contrast excretion within the proximal urinary collecting system. Urinary bladder is decompressed and unremarkable. Normal adrenal glands. STOMACH/BOWEL: The stomach, small and large bowel are normal in course and caliber without inflammatory changes. Small debris-filled duodenal diverticulum. Moderate colonic diverticulosis. Normal appendix. VASCULAR/LYMPHATIC: Aortoiliac vessels are normal in course and caliber. Moderate calcific atherosclerosis. No lymphadenopathy by CT size criteria. REPRODUCTIVE: Normal. OTHER: No intraperitoneal free fluid or free air. New RIGHT lower quadrant fat stranding associated with large  RIGHT fat containing inguinal hernia. Moderate LEFT fat containing inguinal hernia, status post herniorrhaphy. Focally thickened RIGHT pericolic gutter with calcification of prior site of inflammatory process. Small fat containing paraumbilical hernias. MUSCULOSKELETAL: Nonacute. Severe L4-5 and L5-S1 degenerative discs. Severe LEFT L4-5 and L5-S1 neural foraminal narrowing. IMPRESSION: 1. Large LEFT lower lobe consolidation consistent with pneumonia. Small LEFT pleural effusion. 2. Focal fat necrosis RIGHT pelvis associated with large fat containing RIGHT inguinal hernia. 3. No nephrolithiasis, hydronephrosis or pyelonephritis. Aortic Atherosclerosis (ICD10-I70.0). Electronically Signed   By: Elon Alas M.D.   On: 09/02/2017 15:42   Dg Chest Port 1 View  Result Date: 09/04/2017 CLINICAL DATA:  Pleural effusion EXAM: PORTABLE CHEST 1 VIEW COMPARISON:  09/02/2017 FINDINGS: Layering left pleural effusion with diffuse left lung airspace disease, slightly worsened since prior study. Heart is borderline in size. No confluent opacity on the right. Linear areas of atelectasis in the right mid and lower lung. IMPRESSION: Increasing layering left effusion and diffuse left lung airspace disease. Electronically Signed   By: Rolm Baptise M.D.   On: 09/04/2017 09:26   Dg Chest Port 1 View  Result Date: 09/02/2017 CLINICAL DATA:  Chest tightness, shortness of breath and nausea over the last 2 weeks. EXAM: PORTABLE CHEST 1 VIEW COMPARISON:  08/08/2017 FINDINGS: The patient is developed a left effusion with left lower lobe atelectasis and likely pneumonia. Minimal patchy density at the right lung base. Aortic atherosclerosis again noted. Upper lungs are clear. IMPRESSION: Development of left effusion and left lower lobe volume loss/pneumonia. Electronically Signed   By: Nelson Chimes M.D.   On: 09/02/2017 15:01    Echo Study Conclusions  - Left ventricle: The cavity size was normal. Wall thickness was    increased in a pattern of mild LVH. Systolic function was low   normal to mildly reduced. The estimated ejection fraction was in   the range of 50% to 55%. Images were inadequate for LV wall   motion assessment. Doppler parameters are consistent with   abnormal left ventricular relaxation (grade 1 diastolic   dysfunction). - Aortic valve: There was no stenosis. - Mitral valve: There was trivial regurgitation. - Right ventricle: The cavity size was normal. Systolic function   was normal. - Tricuspid valve: Peak RV-RA gradient (S): 39 mm Hg. - Pulmonary arteries: PA peak pressure: 47 mm Hg (S). - Systemic veins: IVC measured 2.1 cm with >50% respirophasic   variation,  suggesting RA pressure 8 mmHg. - Pericardium, extracardiac: Pleural effusion noted.  Impressions:  - Technically difficult study with poor acoustic windows. Normal LV   size with mild LV hypertrophy. Low normal to mildly reduced   systolic function, EF 47-09%. Normal RV size and systolic   function. Mild pulmonary hypertension.    Discharge Exam: Vitals:   09/08/17 1020 09/08/17 1056  BP:  123/81  Pulse: 84 80  Resp:  20  Temp:  97.6 F (36.4 C)  SpO2:  95%    General: Pt is alert, awake, not in acute distress Cardiovascular: RRR, S1/S2 +, no rubs, no gallops Respiratory: CTA bilaterally, no wheezing, no rhonchi Abdominal: Soft, NT, ND, bowel sounds + Extremities: no edema, no cyanosis    The results of significant diagnostics from this hospitalization (including imaging, microbiology, ancillary and laboratory) are listed below for reference.     Microbiology: Recent Results (from the past 240 hour(s))  Blood culture (routine x 2)     Status: None   Collection Time: 09/02/17  1:55 PM  Result Value Ref Range Status   Specimen Description BLOOD LEFT ANTECUBITAL  Final   Special Requests   Final    BOTTLES DRAWN AEROBIC AND ANAEROBIC Blood Culture adequate volume   Culture   Final    NO GROWTH 5  DAYS Performed at [facility], [address], [place], [place] [address]    Report Status 09/07/2017 FINAL  Final  Blood culture (routine x 2)     Status: None   Collection Time: 09/02/17  1:55 PM  Result Value Ref Range Status   Specimen Description BLOOD RIGHT ANTECUBITAL  Final   Special Requests   Final    BOTTLES DRAWN AEROBIC AND ANAEROBIC Blood Culture adequate volume   Culture   Final    NO GROWTH 5 DAYS Performed at [facility], [place] [address] [place], [place] [address]    Report Status 09/07/2017 FINAL  Final  Urine culture     Status: Abnormal   Collection Time: 09/02/17  3:23 PM  Result Value Ref Range Status   Specimen Description URINE, CLEAN CATCH  Final   Special Requests   Final    NONE Performed at [facility], [place] [address] [place], [place] [address]    Culture MULTIPLE SPECIES PRESENT, SUGGEST RECOLLECTION (A)  Final   Report Status 09/03/2017 FINAL  Final  MRSA PCR Screening     Status: None   Collection Time: 09/02/17 11:14 PM  Result Value Ref Range Status   MRSA by PCR NEGATIVE NEGATIVE Final    Comment:        The GeneXpert MRSA Assay (FDA approved for NASAL specimens only), is one component of a comprehensive MRSA colonization surveillance program. It is not intended to diagnose MRSA infection nor to guide or monitor treatment for MRSA infections. Performed at [facility], [place] [address], [place], [place] [address]   Respiratory Panel by PCR     Status: None   Collection Time: 09/03/17 10:06 AM  Result Value Ref Range Status   Adenovirus NOT DETECTED NOT DETECTED Final   Coronavirus 229E NOT DETECTED NOT DETECTED Final   Coronavirus HKU1 NOT DETECTED NOT DETECTED Final   Coronavirus NL63 NOT DETECTED NOT DETECTED Final   Coronavirus OC43 NOT DETECTED NOT DETECTED Final   Metapneumovirus NOT DETECTED NOT DETECTED Final   Rhinovirus / Enterovirus NOT DETECTED NOT DETECTED Final   Influenza A NOT  DETECTED NOT  DETECTED Final   Influenza B NOT DETECTED NOT DETECTED Final   Parainfluenza Virus 1 NOT DETECTED NOT DETECTED Final   Parainfluenza Virus 2 NOT DETECTED NOT DETECTED Final   Parainfluenza Virus 3 NOT DETECTED NOT DETECTED Final   Parainfluenza Virus 4 NOT DETECTED NOT DETECTED Final   Respiratory Syncytial Virus NOT DETECTED NOT DETECTED Final   Bordetella pertussis NOT DETECTED NOT DETECTED Final   Chlamydophila pneumoniae NOT DETECTED NOT DETECTED Final   Mycoplasma pneumoniae NOT DETECTED NOT DETECTED Final    Comment: Performed at [facility], [place] [address], [place], [place] [address]  Culture, Urine     Status: None   Collection Time: 09/04/17  9:31 AM  Result Value Ref Range Status   Specimen Description URINE, CLEAN CATCH  Final   Special Requests NONE  Final   Culture   Final    NO GROWTH Performed at [facility], [place] [address] [place], [place] [address]    Report Status 09/05/2017 FINAL  Final     Labs: BNP (last 3 results) Recent Labs    09/02/17 1315  BNP 670.1*   Basic Metabolic Panel: Recent Labs  Lab 09/04/17 0308 09/04/17 0829 09/05/17 0246 09/06/17 0254 09/07/17 0238 09/08/17 0256  NA 134*  --  134* 135 136 134*  K 4.4  --  4.0 4.0 3.5 4.2  CL 106  --  99* 99* 98* 98*  CO2 21*  --  24 24 29 28   GLUCOSE 168*  --  178* 166* 121* 162*  BUN 19  --  23* 27* 28* 31*  CREATININE 0.90  --  1.19 1.11 1.04 1.03  CALCIUM 8.4*  --  8.9 8.9 9.1 8.8*  MG  --  1.9  --   --   --   --    Liver Function Tests: Recent Labs  Lab 09/02/17 1315  AST 19  ALT 17  ALKPHOS 70  BILITOT 1.5*  PROT 6.6  ALBUMIN 2.6*   No results for input(s): LIPASE, AMYLASE in the last 168 hours. No results for input(s): AMMONIA in the last 168 hours. CBC: Recent Labs  Lab 09/03/17 0535 09/04/17 0829 09/06/17 0254 09/07/17 0238 09/08/17 0256  WBC 11.6* 16.8* 12.9* 12.7* 16.7*  NEUTROABS 10.3*  --   --   --   --   HGB 12.2* 11.7* 12.5* 12.9* 13.1  HCT  37.4* 36.7* 38.2* 39.9 39.9  MCV 86.4 86.4 85.5 85.1 85.3  PLT 292 373 362 355 367   Cardiac Enzymes: Recent Labs  Lab 09/02/17 1732 09/02/17 2214 09/03/17 0535  TROPONINI <0.03 <0.03 <0.03   BNP: Invalid input(s): POCBNP CBG: Recent Labs  Lab 09/05/17 2118  GLUCAP 258*   D-Dimer No results for input(s): DDIMER in the last 72 hours. Hgb A1c No results for input(s): HGBA1C in the last 72 hours. Lipid Profile No results for input(s): CHOL, HDL, LDLCALC, TRIG, CHOLHDL, LDLDIRECT in the last 72 hours. Thyroid function studies No results for input(s): TSH, T4TOTAL, T3FREE, THYROIDAB in the last 72 hours.  Invalid input(s): FREET3 Anemia work up No results for input(s): VITAMINB12, FOLATE, FERRITIN, TIBC, IRON, RETICCTPCT in the last 72 hours. Urinalysis    Component Value Date/Time   COLORURINE YELLOW 09/02/2017 1716   APPEARANCEUR CLEAR 09/02/2017 1716   LABSPEC >1.046 (H) 09/02/2017 1716   PHURINE 5.0 09/02/2017 1716   GLUCOSEU NEGATIVE 09/02/2017 1716   HGBUR NEGATIVE 09/02/2017 1716   BILIRUBINUR NEGATIVE 09/02/2017  [place] 09/02/2017 [place] 09/02/2017 1716   UROBILINOGEN 0.2 08/19/2014 1909   NITRITE NEGATIVE 09/02/2017 1716   LEUKOCYTESUR NEGATIVE 09/02/2017 1716   Sepsis Labs Invalid input(s): PROCALCITONIN,  WBC,  LACTICIDVEN Microbiology Recent Results (from the past 240 hour(s))  Blood culture (routine x 2)     Status: None   Collection Time: 09/02/17  1:55 PM  Result Value Ref Range Status   Specimen Description BLOOD LEFT ANTECUBITAL  Final   Special Requests   Final    BOTTLES DRAWN AEROBIC AND ANAEROBIC Blood Culture adequate volume   Culture   Final    NO GROWTH 5 DAYS Performed at [facility], [place] [address], [place], [place] [address]    Report Status 09/07/2017 FINAL  Final  Blood culture (routine x 2)     Status: None   Collection Time: 09/02/17  1:55 PM  Result Value Ref Range Status    Specimen Description BLOOD RIGHT ANTECUBITAL  Final   Special Requests   Final    BOTTLES DRAWN AEROBIC AND ANAEROBIC Blood Culture adequate volume   Culture   Final    NO GROWTH 5 DAYS Performed at [facility], [place] [address], [place], [place] [address]    Report Status 09/07/2017 FINAL  Final  Urine culture     Status: Abnormal   Collection Time: 09/02/17  3:23 PM  Result Value Ref Range Status   Specimen Description URINE, CLEAN CATCH  Final   Special Requests   Final    NONE Performed at [facility], [place] [address], [place], [place] [address]    Culture MULTIPLE SPECIES PRESENT, SUGGEST RECOLLECTION (A)  Final   Report Status 09/03/2017 FINAL  Final  MRSA PCR Screening     Status: None   Collection Time: 09/02/17 11:14 PM  Result Value Ref Range Status   MRSA by PCR NEGATIVE NEGATIVE Final    Comment:        The GeneXpert MRSA Assay (FDA approved for NASAL specimens only), is one component of a comprehensive MRSA colonization surveillance program. It is not intended to diagnose MRSA infection nor to guide or monitor treatment for MRSA infections. Performed at [facility], [place] [address] [place], [place] [address]   Respiratory Panel by PCR     Status: None   Collection Time: 09/03/17 10:06 AM  Result Value Ref Range Status   Adenovirus NOT DETECTED NOT DETECTED Final   Coronavirus 229E NOT DETECTED NOT DETECTED Final   Coronavirus HKU1 NOT DETECTED NOT DETECTED Final   Coronavirus NL63 NOT DETECTED NOT DETECTED Final   Coronavirus OC43 NOT DETECTED NOT DETECTED Final   Metapneumovirus NOT DETECTED NOT DETECTED Final   Rhinovirus / Enterovirus NOT DETECTED NOT DETECTED Final   Influenza A NOT DETECTED NOT DETECTED Final   Influenza B NOT DETECTED NOT DETECTED Final   Parainfluenza Virus 1 NOT DETECTED NOT DETECTED Final   Parainfluenza Virus 2 NOT DETECTED NOT DETECTED Final   Parainfluenza Virus 3 NOT DETECTED NOT DETECTED Final    Parainfluenza Virus 4 NOT DETECTED NOT DETECTED Final   Respiratory Syncytial Virus NOT DETECTED NOT DETECTED Final   Bordetella pertussis NOT DETECTED NOT DETECTED Final   Chlamydophila pneumoniae NOT DETECTED NOT DETECTED Final   Mycoplasma pneumoniae NOT DETECTED NOT DETECTED Final    Comment: Performed at [facility], [place] [address], [place],  [address]  Culture, Urine  Status: None   Collection Time: 09/04/17  9:31 AM  Result Value Ref Range Status   Specimen Description URINE, CLEAN CATCH  Final   Special Requests NONE  Final   Culture   Final    NO GROWTH Performed at [facility], [address] [place], [place] [address]    Report Status 09/05/2017 FINAL  Final     Patient was seen and examined on the day of discharge and was found to be in stable condition. Time coordinating discharge: 25 minutes including assessment and coordination of care, as well as examination of the patient.   SIGNED:  Dessa Phi, DO [facility] Pager [phone]  If 7PM-7AM, please contact night-coverage [email or web address] Password [email or web address] 09/08/2017, 11:40 AM

## 2017-09-08 NOTE — Progress Notes (Signed)
Occupational Therapy Treatment Patient Details Name: KRISHIV SANDLER MRN: [id number] DOB: [birth year] Today's Date: 09/08/2017    History of present illness Patient is a 77 y/o male who presents with SOB. CXR- LLL PNA with effusion. Found to have acute on chronic respiratory failure from PNA, pleural effusion, CHF with A fib and RVR. PMH includes SDH, sarcoidosis, PTSD, OSA, HTN, COPD.   OT comments  Pt demonstrating progress toward OT goals. He was able to ambulate in room and complete simulated toileting and grooming tasks with supervision using straight cane this session. He was on RA throughout session with SpO2 desaturation to 91% during activity but back up to 95% with rest break and pursed lip breathing strategies. Pt educated concerning energy conservation strategies during ADL and IADL with handout provided and he demonstrates good understanding. D/C recommendation remains appropriate. Pt reports concern over transportation to cardiopulmonary rehabilitation and was on the phone discussing this with case management at end of session. Will continue to follow.    Follow Up Recommendations  No OT follow up;Supervision/Assistance - 24 hour(cardiopulmonary rehabilitation)    Equipment Recommendations  None recommended by OT    Recommendations for Other Services      Precautions / Restrictions Precautions Precautions: Fall Precaution Comments: watch 02 Restrictions Weight Bearing Restrictions: No       Mobility Bed Mobility Overal bed mobility: Needs Assistance Bed Mobility: Supine to Sit     Supine to sit: Supervision;HOB elevated     General bed mobility comments: OOB in chair on my arrival.   Transfers Overall transfer level: Needs assistance Equipment used: Straight cane Transfers: Sit to/from Stand Sit to Stand: Supervision         General transfer comment: Supervision for safety this session with cane (pt reports he will not be using RW at home)    Balance  Overall balance assessment: Needs assistance Sitting-balance support: Feet supported;No upper extremity supported Sitting balance-Leahy Scale: Fair     Standing balance support: During functional activity;Bilateral upper extremity supported Standing balance-Leahy Scale: Fair Standing balance comment: requires single UE support for dynamic standing                           ADL either performed or assessed with clinical judgement   ADL Overall ADL's : Needs assistance/impaired     Grooming: Supervision/safety;Standing                   Toilet Transfer: Supervision/safety;Ambulation(with SPC)   Toileting- Clothing Manipulation and Hygiene: Supervision/safety;Sit to/from stand       Functional mobility during ADLs: Supervision/safety;Cane General ADL Comments: Pt able to complete ADL and functional mobility for toileting and grooming tasks today with overall supervision using cane. SpO2 on RA did drop to 91% with activity but improved to 95% with pursed lip breathing and therapeutic rest break. Educated pt concerning energy conservation strategies with handout provided.      Vision   Vision Assessment?: No apparent visual deficits   Perception     Praxis      Cognition Arousal/Alertness: Awake/alert Behavior During Therapy: WFL for tasks assessed/performed Overall Cognitive Status: Within Functional Limits for tasks assessed                                 General Comments: For basic mobility tasks but not formally assessed.         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;  Pain Descriptors / Indicators: Aching;Burning Pain Intervention(s): Monitored during session;Limited activity within patient's tolerance  Home Living                                           Prior Functioning/Environment              Frequency  Min 2X/week        Progress Toward Goals  OT Goals(current goals can now be found in the care plan section)  Progress towards OT goals: Progressing toward goals  Acute Rehab OT Goals Patient Stated Goal: to get back to cardiac rehab OT Goal Formulation: With patient Time For Goal Achievement: 09/20/17 Potential to Achieve Goals: Good  Plan Discharge plan remains appropriate    Co-evaluation                 AM-PAC PT "6 Clicks" Daily Activity     Outcome Measure   Help from another person eating meals?: None Help from another person taking care of personal grooming?: A Little Help from another person toileting, which includes using toliet, bedpan, or urinal?: A Little Help from another person bathing (including washing, rinsing, drying)?: A Little Help from another person to put on and taking off regular upper body clothing?: A Little Help from another person to put on and taking off regular lower body clothing?: A Little 6 Click Score: 19    End of Session Equipment Utilized During Treatment: (single point cane)  OT Visit Diagnosis: Unsteadiness on feet (R26.81);Other abnormalities of gait and mobility (R26.89);Muscle weakness (generalized) (M62.81)   Activity Tolerance Patient tolerated treatment well   Patient Left with call bell/phone within reach;in bed   Nurse Communication Mobility status        Time: 9373-4287 OT Time Calculation (min): 18 min  Charges: OT General Charges $OT Visit: 1 Visit OT Treatments $Self Care/Home Management : 8-22 mins  Norman Herrlich, MS OTR/L  Pager: [place] A Brycelyn Gambino 09/08/2017, 2:08 PM

## 2017-09-08 NOTE — Care Management Note (Addendum)
Case Management Note  Patient Details  Name: Travis Ferguson MRN: [id number] Date of Birth: 28-Jul-1940  Subjective/Objective:     Pt admitted with PNA              Action/Plan:  PTA independent from home with wife.     Expected Discharge Date:  09/08/17               Expected Discharge Plan:  Home/Self Care  In-House Referral:     Discharge planning Services  CM Consult  Post Acute Care Choice:    Choice offered to:  Patient  DME Arranged:    DME Agency:     HH Arranged:  (output OT) [place] Agency:     Status of Service:  Completed, signed off  If discussed at [place] of Stay Meetings, dates discussed:    Additional Comments: Pt preparing for discharge - pt will be driven home by wife today.  Pts wife will provide 24 hour supervision once discharged.  Pt confirmed he is only on oxygen at night - and he states he has working equipment in the home. CM discussed therapy recommendations with PT;  Pt would benefit from either outpt PT or cardiopulm therapy.   Pt informed CM that his insurance will no longer pay for Cardiopulm outpt rehab.  Pt is in agreement with outpt PT- CM submitted referral via epic.  Maryclare Labrador, RN 09/08/2017, 12:11 PM

## 2017-09-08 NOTE — Progress Notes (Signed)
SATURATION QUALIFICATIONS: (This note is used to comply with regulatory documentation for home oxygen)  Patient Saturations on Room Air at Rest = 93%  Patient Saturations on Room Air while Ambulating = 88%  Patient Saturations on 0 Liters of oxygen while Ambulating = 93%  Please briefly explain why patient needs home oxygen:  Pt does not appear to need O2 supplementally.  He will drop briefly to 88% then quickly return to 93% with standing rest break and pursed lip breathing.  Informed nursing of subtle drops in O2 saturations.   Governor Rooks, PTA pager [phone]

## 2017-09-08 NOTE — Progress Notes (Signed)
Discharge note. Patient and family educated at bedside. RN educated on medications and when to take them, follow-up appointments, when to call the MD, when to seek immediate medical attention, and signs and symptoms to look for. PIV removed without complications.

## 2017-09-11 ENCOUNTER — Encounter: Payer: Self-pay | Admitting: Cardiovascular Disease

## 2017-09-11 ENCOUNTER — Ambulatory Visit: Payer: Medicare Other | Admitting: Cardiovascular Disease

## 2017-09-11 ENCOUNTER — Ambulatory Visit: Payer: Medicare Other | Admitting: Physician Assistant

## 2017-09-11 VITALS — BP 98/66 | HR 91 | Ht 71.0 in | Wt 254.6 lb

## 2017-09-11 DIAGNOSIS — I519 Heart disease, unspecified: Secondary | ICD-10-CM

## 2017-09-11 DIAGNOSIS — I483 Typical atrial flutter: Secondary | ICD-10-CM | POA: Diagnosis not present

## 2017-09-11 DIAGNOSIS — I10 Essential (primary) hypertension: Secondary | ICD-10-CM | POA: Diagnosis not present

## 2017-09-11 MED ORDER — AMIODARONE HCL 200 MG PO TABS: 200.0000 mg | ORAL_TABLET | Freq: Every day | ORAL | 0 refills | Status: DC

## 2017-09-11 MED ORDER — AMIODARONE HCL 200 MG PO TABS: 200.0000 mg | ORAL_TABLET | Freq: Every day | ORAL | 3 refills | Status: DC

## 2017-09-11 NOTE — Assessment & Plan Note (Signed)
History of left ventricular dysfunction in the past on Entresto with recent echo performed 09/04/17 revealing normal LV systolic function.

## 2017-09-11 NOTE — Patient Instructions (Addendum)
Medication Instructions: Your physician recommends that you continue on your current medications as directed. Please refer to the Current Medication list given to you today.  Decrease Amiodarone to 200 mg daily. Stop taking this after 1 month.   Follow-Up: We request that you follow-up in: 3 months with an extender and in 6 months with Dr Andria Rhein will receive a reminder letter in the mail two months in advance. If you don't receive a letter, please call our office to schedule the follow-up appointment.  If you need a refill on your cardiac medications before your next appointment, please call your pharmacy.

## 2017-09-11 NOTE — Assessment & Plan Note (Signed)
History of hyperlipidemia on statin therapy with lipid profile performed 09/04/17 revealed a total cholesterol 129, LDL of 84 and HDL of 25

## 2017-09-11 NOTE — Assessment & Plan Note (Signed)
Travis Ferguson had atrial flutter with RVR during his recent hospitalization most likely related to his left lower lobe pneumonia. He initially was treated withamiodarone and antibiotics. He was placed on oral anticoagulation briefly which was discontinued prior to discharge.Ultimately, he converted to sinus rhythm which he is in today. I'm going to decrease his amiodarone from 200 mg twice a day to once a day for 4 more weeks and then discontinue it altogether.

## 2017-09-11 NOTE — Progress Notes (Signed)
09/11/2017 CHEZ BULNES   08-16-1940  [id number]  Primary Physician Crist Infante, MD Primary Cardiologist: Lorretta Harp MD Lupe Carney, [place]  HPI:  Travis Ferguson is a 77 y.o.  moderately overweight married Caucasian male, father of 2, grandfather to 1 grandchild, who is a retired Catering manager. He is referred through the courtesy of Dr. Joylene Draft for cardiovascular evaluation because of chronic dyspnea and exertional chest pressure. I last saw him in the office 03/28/17.Travis Ferguson  His cardiac risk factor profile is remarkable for remote discontinued tobacco abuse back in 1996. He smoked a pipe at that time. He has treated hypertension and hyperlipidemia. He does have a family history of heart disease with a brother who had bypass surgery in his mid 37s. He has never had a heart attack or stroke. His past surgical history is remarkable for subdural hematoma and brain surgery in the past. He has been worked up by Dr. Halford Chessman for question of lung CA, which has never been clinically documented. He did have a chest CT that showed, among other things, plaque and atherosclerosis in his coronary arteries.   The patient recently had a 2-D echocardiogram and Myoview stress test which showed normal left for function and no evidence of ischemia. He did have grade 1 diastolic dysfunction. He denies chest pain. 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  He had cholecystectomy in early May for gangrenous gallbladder with prolonged hospitalization. He was recently noted to be more short of breath in usual and a BNP was ordered that was in the 500 range. He was treated with oral diuretics which resulted in marked improvement in his symptoms.  A follow-up 2-D echocardiogram showed an EF of 20 or 25% with severe global hypokinesia as well as segmental wall motion abnormalities. Because of the  decline in his ejection fraction he underwent right and left heart cath by myself 01/23/15 revealing minimal CAD, elevated filling pressures and severe LV dysfunction. I offered him the option of having an ICD placed for primary prevention and referred him to Dr. Erline Hau he has happily decided not to pursue this. He is participating in cardiac rehabilitation.his most recent 2-D echo performed 04/19/15 revealed persistent LV dysfunction with an EF in the 30% range despite optimal medical therapy.  He recently underwent right shoulder repair by Dr. Veverly Fells and has continued right arm pain. Also sounds like he has a cervical radiculopathy. He is participating in pulmonary rehabilitation. A recent 2-D echocardiogram performed 05/03/16 revealed slight improvement in his ejection fraction up to 40-45%.  Since I saw him back he was admitted on 09/02/17 with respiratory failure and A. Flutter with RVR. He was found to have a left lower lobe pneumonia and was treated with BiPAP, bronchodilators, steroids and IV antibiotics. He was also placed on IV amiodarone which was converted to by mouth. He was anticoagulated during his hospitalization however this was stopped at discharge. He currently is in sinus rhythm. He is feeling clinically improved.      Current Meds  Medication Sig  . albuterol (PROAIR HFA) 108 (90 Base) MCG/ACT inhaler 1-2 PUFFS EVERY 4-6 HOURS AS NEEDED for shortness of breath  . amiodarone (PACERONE) 200 MG tablet Take 1 tablet (200 mg total) by mouth daily.  Travis Ferguson atorvastatin (LIPITOR) 20 MG tablet Take 1 tablet (20 mg total) by mouth daily at  6 PM.  . budesonide-formoterol (SYMBICORT) 160-4.5 MCG/ACT inhaler Inhale 2 puffs into the lungs 2 (two) times daily.  . chlorhexidine (PERIDEX) 0.12 % solution chlorhexidine gluconate 0.12 % mouthwash  . DULoxetine (CYMBALTA) 30 MG capsule Take 30 mg by mouth daily.  Travis Ferguson ENTRESTO 24-26 MG TAKE 1 TABLET BY MOUTH 2 (TWO) TIMES DAILY.  . finasteride  (PROSCAR) 5 MG tablet Take 5 mg by mouth daily.   Travis Ferguson FLUoxetine (PROZAC) 40 MG capsule Take 40 mg by mouth daily with breakfast.   . furosemide (LASIX) 20 MG tablet Take 1 tablet (20 mg total) by mouth 2 (two) times daily.  Travis Ferguson HYDROcodone-acetaminophen (NORCO/VICODIN) 5-325 MG tablet Take 1 tablet by mouth every 8 (eight) hours as needed for moderate pain.   Travis Ferguson ipratropium (ATROVENT) 0.02 % nebulizer solution Take 2.5 mLs (0.5 mg total) by nebulization 4 (four) times daily.  . metoprolol tartrate (LOPRESSOR) 25 MG tablet TAKE 1 TABLET (25 MG TOTAL) BY MOUTH 2 (TWO) TIMES DAILY.  . Multiple Vitamin (MULTIVITAMIN) tablet Take 1 tablet by mouth daily.  . OXYGEN Inhale 2 L/min into the lungs at bedtime.  . predniSONE (DELTASONE) 10 MG tablet Take 4 tabs for 3 days, then 3 tabs for 3 days, then 2 tabs for 3 days, then 1 tab for 3 days, then 1/2 tab for 4 days.  . sodium chloride (OCEAN) 0.65 % SOLN nasal spray Place 1-2 sprays into both nostrils daily as needed for congestion.  . SYMBICORT 160-4.5 MCG/ACT inhaler INHALE 2 PUFFS INTO THE LUNGS 2 (TWO) TIMES DAILY.  . [DISCONTINUED] amiodarone (PACERONE) 200 MG tablet Take 1 tablet (200 mg total) by mouth 2 (two) times daily.  . [DISCONTINUED] amiodarone (PACERONE) 200 MG tablet Take 1 tablet (200 mg total) by mouth daily.     Allergies  Allergen Reactions  . Statins Other (See Comments)    Muscle aches - tolerating Vytorin    Social History   Socioeconomic History  . Marital status: Married    Spouse name: Not on file  . Number of children: 2  . Years of education: 40  . Highest education level: Not on file  Occupational History  . Occupation: retired Airline pilot in [employer]  . Financial resource strain: Not on file  . Food insecurity:    Worry: Not on file    Inability: Not on file  . Transportation needs:    Medical: Not on file    Non-medical: Not on file  Tobacco Use  . Smoking status: Former Smoker    Packs/day: 0.00     Years: 30.00    Pack years: 0.00    Types: Pipe    Last attempt to quit: 06/24/1998    Years since quitting: 19.2  . Smokeless tobacco: Never Used  Substance and Sexual Activity  . Alcohol use: No  . Drug use: No  . Sexual activity: Not Currently  Lifestyle  . Physical activity:    Days per week: Not on file    Minutes per session: Not on file  . Stress: Not on file  Relationships  . Social connections:    Talks on phone: Not on file    Gets together: Not on file    Attends religious service: Not on file    Active member of club or organization: Not on file    Attends meetings of clubs or organizations: Not on file    Relationship status: Not on file  . Intimate partner violence:  Fear of current or ex partner: Not on file    Emotionally abused: Not on file    Physically abused: Not on file    Forced sexual activity: Not on file  Other Topics Concern  . Not on file  Social History Narrative   Drinks about 6-7 caffeine drinks a day      Review of Systems: General: negative for chills, fever, night sweats or weight changes.  Cardiovascular: negative for chest pain, dyspnea on exertion, edema, orthopnea, palpitations, paroxysmal nocturnal dyspnea or shortness of breath Dermatological: negative for rash Respiratory: negative for cough or wheezing Urologic: negative for hematuria Abdominal: negative for nausea, vomiting, diarrhea, bright red blood per rectum, melena, or hematemesis Neurologic: negative for visual changes, syncope, or dizziness All other systems reviewed and are otherwise negative except as noted above.    Blood pressure 98/66, pulse 91, height 5\' 11"  (1.803 m), weight 254 lb 9.6 oz (115.5 kg).  General appearance: alert and no distress Neck: no adenopathy, no carotid bruit, no JVD, supple, symmetrical, trachea midline and thyroid not enlarged, symmetric, no tenderness/mass/nodules Lungs: clear to auscultation bilaterally Heart: regular rate and rhythm,  S1, S2 normal, no murmur, click, rub or gallop Extremities: extremities normal, atraumatic, no cyanosis or edema Pulses: 2+ and symmetric Skin: Skin color, texture, turgor normal. No rashes or lesions Neurologic: Alert and oriented X 3, normal strength and tone. Normal symmetric reflexes. Normal coordination and gait  EKG sinus rhythm 87 with nonspecific ST and T-wave changes. I personally reviewed this EKG.  ASSESSMENT AND PLAN:   Essential hypertension History of essential hypertension with blood pressures measured at 98/66. He is on Lopressor. Continue current meds at current dosing.  Hyperlipidemia History of hyperlipidemia on statin therapy with lipid profile performed 09/04/17 revealed a total cholesterol 129, LDL of 84 and HDL of 25  Left ventricular dysfunction History of left ventricular dysfunction in the past on Entresto with recent echo performed 09/04/17 revealing normal LV systolic function.  Typical atrial flutter [facility]) Mr. Osmundson had atrial flutter with RVR during his recent hospitalization most likely related to his left lower lobe pneumonia. He initially was treated withamiodarone and antibiotics. He was placed on oral anticoagulation briefly which was discontinued prior to discharge.Ultimately, he converted to sinus rhythm which he is in today. I'm going to decrease his amiodarone from 200 mg twice a day to once a day for 4 more weeks and then discontinue it altogether.      Lorretta Harp MD FACP,FACC,FAHA, [facility] 09/11/2017 3:00 PM

## 2017-09-11 NOTE — Assessment & Plan Note (Signed)
History of essential hypertension with blood pressures measured at 98/66. He is on Lopressor. Continue current meds at current dosing.

## 2017-09-22 ENCOUNTER — Telehealth: Payer: Self-pay | Admitting: Pulmonary Disease

## 2017-09-22 MED ORDER — BENZONATATE 200 MG PO CAPS: 200.0000 mg | ORAL_CAPSULE | Freq: Three times a day (TID) | ORAL | 1 refills | Status: DC | PRN

## 2017-09-22 NOTE — Telephone Encounter (Signed)
Can send script for tessalon 200 mg tid prn, #30 with 1 refill.

## 2017-09-22 NOTE — Telephone Encounter (Signed)
Called patient to follow up about symptoms. Patient stated that he was recently in the hospital (09/02/17) and for the past week has been coughing so much that he is unable to sleep. Patient has tried cough drops, mucinex, and a cough syrup he can't remember the name over. Patient denies fever, reports his blood pressure and heart rate are WNL. Patient stated it's mostly a dry cough but occasionally it's productive of small white colored sputum. Patient asking if there is anything he take to help with cough so he can hopefully get some rest.  VS please advise. Thank you!

## 2017-09-22 NOTE — Telephone Encounter (Signed)
Called and spoke with patient and let him know of Rx being sent in for Tessalon. Patient thanked staff for getting this sent in. Nothing further is needed at this time.

## 2017-09-24 ENCOUNTER — Ambulatory Visit: Payer: Medicare Other | Admitting: Cardiovascular Disease

## 2017-10-07 ENCOUNTER — Telehealth: Payer: Self-pay | Admitting: Pulmonary Disease

## 2017-10-07 DIAGNOSIS — G4733 Obstructive sleep apnea (adult) (pediatric): Secondary | ICD-10-CM | POA: Diagnosis not present

## 2017-10-07 NOTE — Telephone Encounter (Signed)
Spoke with patient, he states that he fell on Friday of last week and fell on the shoulder that was replaced. Patient states that he has been dizzy ever sense. Advised patient that he would have to see his PCP for that due to this being a pulmonary office.    Patient states that he is having increased SOB and mucus production and wants to know what he can do about that. VS please advise, thanks.

## 2017-10-07 NOTE — Telephone Encounter (Signed)
Called and spoke with patient, per MW patient can been seen here in the office or he can to go UC for a visit. Patient advised that he would go to UC if he felt like he needed to but did not want to make an appointment at this time. Nothing further needed.

## 2017-10-08 ENCOUNTER — Other Ambulatory Visit: Payer: Self-pay | Admitting: *Deleted

## 2017-10-08 DIAGNOSIS — R29818 Other symptoms and signs involving the nervous system: Secondary | ICD-10-CM

## 2017-10-09 ENCOUNTER — Emergency Department (HOSPITAL_COMMUNITY): Payer: Medicare Other

## 2017-10-09 ENCOUNTER — Inpatient Hospital Stay (HOSPITAL_COMMUNITY)
Admission: EM | Admit: 2017-10-09 | Discharge: 2017-10-13 | DRG: 193 | Disposition: A | Discharge status: Home / Self Care | Payer: Medicare Other | Attending: Internal Medicine | Admitting: Internal Medicine

## 2017-10-09 ENCOUNTER — Inpatient Hospital Stay (HOSPITAL_COMMUNITY): Payer: Medicare Other

## 2017-10-09 ENCOUNTER — Encounter (HOSPITAL_COMMUNITY): Payer: Self-pay | Admitting: *Deleted

## 2017-10-09 ENCOUNTER — Other Ambulatory Visit: Payer: Self-pay

## 2017-10-09 DIAGNOSIS — J440 Chronic obstructive pulmonary disease with acute lower respiratory infection: Secondary | ICD-10-CM | POA: Diagnosis present

## 2017-10-09 DIAGNOSIS — R0600 Dyspnea, unspecified: Secondary | ICD-10-CM

## 2017-10-09 DIAGNOSIS — Z9889 Other specified postprocedural states: Secondary | ICD-10-CM

## 2017-10-09 DIAGNOSIS — I493 Ventricular premature depolarization: Secondary | ICD-10-CM | POA: Diagnosis not present

## 2017-10-09 DIAGNOSIS — I428 Other cardiomyopathies: Secondary | ICD-10-CM | POA: Diagnosis present

## 2017-10-09 DIAGNOSIS — J181 Lobar pneumonia, unspecified organism: Secondary | ICD-10-CM | POA: Diagnosis present

## 2017-10-09 DIAGNOSIS — Z9981 Dependence on supplemental oxygen: Secondary | ICD-10-CM

## 2017-10-09 DIAGNOSIS — E669 Obesity, unspecified: Secondary | ICD-10-CM | POA: Diagnosis present

## 2017-10-09 DIAGNOSIS — I2510 Atherosclerotic heart disease of native coronary artery without angina pectoris: Secondary | ICD-10-CM | POA: Diagnosis present

## 2017-10-09 DIAGNOSIS — Z7951 Long term (current) use of inhaled steroids: Secondary | ICD-10-CM

## 2017-10-09 DIAGNOSIS — I480 Paroxysmal atrial fibrillation: Secondary | ICD-10-CM | POA: Diagnosis not present

## 2017-10-09 DIAGNOSIS — M1990 Unspecified osteoarthritis, unspecified site: Secondary | ICD-10-CM | POA: Diagnosis present

## 2017-10-09 DIAGNOSIS — Z87891 Personal history of nicotine dependence: Secondary | ICD-10-CM

## 2017-10-09 DIAGNOSIS — Z79899 Other long term (current) drug therapy: Secondary | ICD-10-CM

## 2017-10-09 DIAGNOSIS — R251 Tremor, unspecified: Secondary | ICD-10-CM | POA: Diagnosis present

## 2017-10-09 DIAGNOSIS — F329 Major depressive disorder, single episode, unspecified: Secondary | ICD-10-CM | POA: Diagnosis present

## 2017-10-09 DIAGNOSIS — I110 Hypertensive heart disease with heart failure: Secondary | ICD-10-CM | POA: Diagnosis present

## 2017-10-09 DIAGNOSIS — E785 Hyperlipidemia, unspecified: Secondary | ICD-10-CM | POA: Diagnosis present

## 2017-10-09 DIAGNOSIS — Y95 Nosocomial condition: Secondary | ICD-10-CM | POA: Diagnosis present

## 2017-10-09 DIAGNOSIS — R0902 Hypoxemia: Secondary | ICD-10-CM | POA: Diagnosis not present

## 2017-10-09 DIAGNOSIS — G894 Chronic pain syndrome: Secondary | ICD-10-CM | POA: Diagnosis present

## 2017-10-09 DIAGNOSIS — J9621 Acute and chronic respiratory failure with hypoxia: Secondary | ICD-10-CM | POA: Diagnosis present

## 2017-10-09 DIAGNOSIS — Z888 Allergy status to other drugs, medicaments and biological substances status: Secondary | ICD-10-CM

## 2017-10-09 DIAGNOSIS — D860 Sarcoidosis of lung: Secondary | ICD-10-CM | POA: Diagnosis present

## 2017-10-09 DIAGNOSIS — Z8249 Family history of ischemic heart disease and other diseases of the circulatory system: Secondary | ICD-10-CM

## 2017-10-09 DIAGNOSIS — R918 Other nonspecific abnormal finding of lung field: Secondary | ICD-10-CM | POA: Diagnosis present

## 2017-10-09 DIAGNOSIS — G4733 Obstructive sleep apnea (adult) (pediatric): Secondary | ICD-10-CM | POA: Diagnosis present

## 2017-10-09 DIAGNOSIS — J90 Pleural effusion, not elsewhere classified: Secondary | ICD-10-CM | POA: Diagnosis present

## 2017-10-09 DIAGNOSIS — Z9181 History of falling: Secondary | ICD-10-CM

## 2017-10-09 DIAGNOSIS — Z8701 Personal history of pneumonia (recurrent): Secondary | ICD-10-CM

## 2017-10-09 DIAGNOSIS — Z800 Family history of malignant neoplasm of digestive organs: Secondary | ICD-10-CM

## 2017-10-09 DIAGNOSIS — I472 Ventricular tachycardia: Secondary | ICD-10-CM | POA: Diagnosis not present

## 2017-10-09 DIAGNOSIS — F4310 Post-traumatic stress disorder, unspecified: Secondary | ICD-10-CM | POA: Diagnosis present

## 2017-10-09 DIAGNOSIS — I5042 Chronic combined systolic (congestive) and diastolic (congestive) heart failure: Secondary | ICD-10-CM | POA: Diagnosis present

## 2017-10-09 DIAGNOSIS — J438 Other emphysema: Secondary | ICD-10-CM | POA: Diagnosis not present

## 2017-10-09 DIAGNOSIS — Z96612 Presence of left artificial shoulder joint: Secondary | ICD-10-CM | POA: Diagnosis present

## 2017-10-09 DIAGNOSIS — J189 Pneumonia, unspecified organism: Secondary | ICD-10-CM | POA: Diagnosis not present

## 2017-10-09 DIAGNOSIS — I420 Dilated cardiomyopathy: Secondary | ICD-10-CM | POA: Diagnosis present

## 2017-10-09 DIAGNOSIS — Z6833 Body mass index (BMI) 33.0-33.9, adult: Secondary | ICD-10-CM

## 2017-10-09 DIAGNOSIS — R0603 Acute respiratory distress: Secondary | ICD-10-CM

## 2017-10-09 DIAGNOSIS — I5022 Chronic systolic (congestive) heart failure: Secondary | ICD-10-CM | POA: Diagnosis not present

## 2017-10-09 DIAGNOSIS — I5032 Chronic diastolic (congestive) heart failure: Secondary | ICD-10-CM | POA: Diagnosis not present

## 2017-10-09 LAB — BODY FLUID CELL COUNT WITH DIFFERENTIAL
EOS FL: 0 %
LYMPHS FL: 77 %
MONOCYTE-MACROPHAGE-SEROUS FLUID: 13 % — AB (ref 50–90)
NEUTROPHIL FLUID: 10 % (ref 0–25)
Total Nucleated Cell Count, Fluid: 8126 cu mm — ABNORMAL HIGH (ref 0–1000)

## 2017-10-09 LAB — COMPREHENSIVE METABOLIC PANEL
ALBUMIN: 2.8 g/dL — AB (ref 3.5–5.0)
ALT: 12 U/L — ABNORMAL LOW (ref 17–63)
AST: 14 U/L — AB (ref 15–41)
Alkaline Phosphatase: 74 U/L (ref 38–126)
Anion gap: 14 (ref 5–15)
BUN: 15 mg/dL (ref 6–20)
CHLORIDE: 99 mmol/L — AB (ref 101–111)
CO2: 24 mmol/L (ref 22–32)
CREATININE: 1.07 mg/dL (ref 0.61–1.24)
Calcium: 9 mg/dL (ref 8.9–10.3)
GFR calc Af Amer: >60 mL/min (ref >60)
GFR calc non Af Amer: >60 mL/min (ref >60)
GLUCOSE: 143 mg/dL — AB (ref 65–99)
POTASSIUM: 3.8 mmol/L (ref 3.5–5.1)
Sodium: 137 mmol/L (ref 135–145)
Total Bilirubin: 0.8 mg/dL (ref 0.3–1.2)
Total Protein: 6.8 g/dL (ref 6.5–8.1)

## 2017-10-09 LAB — LACTATE DEHYDROGENASE, PLEURAL OR PERITONEAL FLUID: LD, Fluid: 168 U/L — ABNORMAL HIGH (ref 3–23)

## 2017-10-09 LAB — CBC WITH DIFFERENTIAL/PLATELET
BASOS PCT: 0 %
Basophils Absolute: 0 10*3/uL (ref 0.0–0.1)
EOS PCT: 1 %
Eosinophils Absolute: 0.1 10*3/uL (ref 0.0–0.7)
HCT: 36.1 % — ABNORMAL LOW (ref 39.0–52.0)
Hemoglobin: 12 g/dL — ABNORMAL LOW (ref 13.0–17.0)
LYMPHS ABS: 1.1 10*3/uL (ref 0.7–4.0)
Lymphocytes Relative: 8 %
MCH: 27.7 pg (ref 26.0–34.0)
MCHC: 33.2 g/dL (ref 30.0–36.0)
MCV: 83.4 fL (ref 78.0–100.0)
MONO ABS: 0.7 10*3/uL (ref 0.1–1.0)
MONOS PCT: 5 %
NEUTROS ABS: 12.3 10*3/uL — AB (ref 1.7–7.7)
Neutrophils Relative %: 86 %
PLATELETS: 464 10*3/uL — AB (ref 150–400)
RBC: 4.33 MIL/uL (ref 4.22–5.81)
RDW: 14.4 % (ref 11.5–15.5)
WBC: 14.2 10*3/uL — ABNORMAL HIGH (ref 4.0–10.5)

## 2017-10-09 LAB — ALBUMIN, PLEURAL OR PERITONEAL FLUID: Albumin, Fluid: 1.7 g/dL

## 2017-10-09 LAB — GRAM STAIN

## 2017-10-09 LAB — PROTEIN, PLEURAL OR PERITONEAL FLUID: Total protein, fluid: 3.5 g/dL

## 2017-10-09 LAB — TROPONIN I: Troponin I: <0.03 ng/mL (ref <0.03)

## 2017-10-09 LAB — GLUCOSE, PLEURAL OR PERITONEAL FLUID: Glucose, Fluid: 112 mg/dL

## 2017-10-09 LAB — BRAIN NATRIURETIC PEPTIDE: B Natriuretic Peptide: 106.1 pg/mL — ABNORMAL HIGH (ref 0.0–100.0)

## 2017-10-09 MED ORDER — IPRATROPIUM-ALBUTEROL 0.5-2.5 (3) MG/3ML IN SOLN
3.0000 mL | Freq: Four times a day (QID) | RESPIRATORY_TRACT | Status: DC
  Administered 2017-10-09: 3 mL via RESPIRATORY_TRACT
  Filled 2017-10-09: qty 3

## 2017-10-09 MED ORDER — METHYLPREDNISOLONE SODIUM SUCC 40 MG IJ SOLR
40.0000 mg | Freq: Three times a day (TID) | INTRAMUSCULAR | Status: DC
  Administered 2017-10-09 – 2017-10-10 (×3): 40 mg via INTRAVENOUS
  Filled 2017-10-09 (×3): qty 1

## 2017-10-09 MED ORDER — AMIODARONE HCL 200 MG PO TABS
200.0000 mg | ORAL_TABLET | Freq: Every day | ORAL | Status: DC
  Administered 2017-10-10 – 2017-10-13 (×4): 200 mg via ORAL
  Filled 2017-10-09 (×4): qty 1

## 2017-10-09 MED ORDER — DULOXETINE HCL 30 MG PO CPEP
30.0000 mg | ORAL_CAPSULE | Freq: Every day | ORAL | Status: DC
  Administered 2017-10-10 – 2017-10-13 (×4): 30 mg via ORAL
  Filled 2017-10-09 (×4): qty 1

## 2017-10-09 MED ORDER — SODIUM CHLORIDE 0.9 % IV SOLN
2000.0000 mg | Freq: Once | INTRAVENOUS | Status: AC
  Administered 2017-10-09: 2000 mg via INTRAVENOUS
  Filled 2017-10-09: qty 2000

## 2017-10-09 MED ORDER — SODIUM CHLORIDE 0.9 % IV SOLN
1.0000 g | Freq: Three times a day (TID) | INTRAVENOUS | Status: DC
  Administered 2017-10-09 – 2017-10-11 (×6): 1 g via INTRAVENOUS
  Filled 2017-10-09 (×7): qty 1

## 2017-10-09 MED ORDER — VITAMIN D3 25 MCG (1000 UNIT) PO TABS
1000.0000 [IU] | ORAL_TABLET | Freq: Every day | ORAL | Status: DC
  Administered 2017-10-10 – 2017-10-13 (×4): 1000 [IU] via ORAL
  Filled 2017-10-09 (×4): qty 1

## 2017-10-09 MED ORDER — METOPROLOL TARTRATE 25 MG PO TABS
25.0000 mg | ORAL_TABLET | Freq: Two times a day (BID) | ORAL | Status: DC
  Administered 2017-10-09 – 2017-10-13 (×8): 25 mg via ORAL
  Filled 2017-10-09 (×8): qty 1

## 2017-10-09 MED ORDER — IPRATROPIUM-ALBUTEROL 0.5-2.5 (3) MG/3ML IN SOLN: 3.0000 mL | Freq: Four times a day (QID) | RESPIRATORY_TRACT | Status: DC

## 2017-10-09 MED ORDER — BENZONATATE 100 MG PO CAPS
200.0000 mg | ORAL_CAPSULE | Freq: Three times a day (TID) | ORAL | Status: DC | PRN
  Administered 2017-10-09 – 2017-10-11 (×4): 200 mg via ORAL
  Filled 2017-10-09 (×4): qty 2

## 2017-10-09 MED ORDER — FLUOXETINE HCL 20 MG PO CAPS
40.0000 mg | ORAL_CAPSULE | Freq: Every day | ORAL | Status: DC
  Filled 2017-10-09: qty 2

## 2017-10-09 MED ORDER — GUAIFENESIN ER 600 MG PO TB12
1200.0000 mg | ORAL_TABLET | Freq: Two times a day (BID) | ORAL | Status: DC
  Administered 2017-10-09 – 2017-10-13 (×8): 1200 mg via ORAL
  Filled 2017-10-09 (×8): qty 2

## 2017-10-09 MED ORDER — MOMETASONE FURO-FORMOTEROL FUM 200-5 MCG/ACT IN AERO
2.0000 | INHALATION_SPRAY | Freq: Two times a day (BID) | RESPIRATORY_TRACT | Status: DC
  Administered 2017-10-10 – 2017-10-13 (×7): 2 via RESPIRATORY_TRACT
  Filled 2017-10-09: qty 8.8

## 2017-10-09 MED ORDER — SODIUM CHLORIDE 3 % IN NEBU
4.0000 mL | INHALATION_SOLUTION | Freq: Three times a day (TID) | RESPIRATORY_TRACT | Status: DC
  Administered 2017-10-10 – 2017-10-11 (×4): 4 mL via RESPIRATORY_TRACT
  Filled 2017-10-09 (×7): qty 4

## 2017-10-09 MED ORDER — HYDROCODONE-ACETAMINOPHEN 5-325 MG PO TABS
1.0000 | ORAL_TABLET | Freq: Three times a day (TID) | ORAL | Status: DC | PRN
  Administered 2017-10-09 – 2017-10-10 (×2): 1 via ORAL
  Filled 2017-10-09 (×2): qty 1

## 2017-10-09 MED ORDER — LIDOCAINE HCL 1 % IJ SOLN
INTRAMUSCULAR | Status: AC
  Filled 2017-10-09: qty 20

## 2017-10-09 MED ORDER — FINASTERIDE 5 MG PO TABS
5.0000 mg | ORAL_TABLET | Freq: Every day | ORAL | Status: DC
  Administered 2017-10-10 – 2017-10-13 (×4): 5 mg via ORAL
  Filled 2017-10-09 (×4): qty 1

## 2017-10-09 MED ORDER — IPRATROPIUM-ALBUTEROL 0.5-2.5 (3) MG/3ML IN SOLN: 3.0000 mL | RESPIRATORY_TRACT | Status: DC | PRN

## 2017-10-09 MED ORDER — ATORVASTATIN CALCIUM 20 MG PO TABS
20.0000 mg | ORAL_TABLET | Freq: Every day | ORAL | Status: DC
  Administered 2017-10-09 – 2017-10-12 (×4): 20 mg via ORAL
  Filled 2017-10-09: qty 1
  Filled 2017-10-09: qty 2
  Filled 2017-10-09 (×4): qty 1
  Filled 2017-10-09 (×2): qty 2

## 2017-10-09 MED ORDER — FLUOXETINE HCL 20 MG PO CAPS
40.0000 mg | ORAL_CAPSULE | Freq: Every day | ORAL | Status: DC
  Administered 2017-10-10 – 2017-10-13 (×4): 40 mg via ORAL
  Filled 2017-10-09 (×4): qty 2

## 2017-10-09 NOTE — Progress Notes (Signed)
A consult was received from an ED physician for vancomycin per pharmacy dosing.  The patient's profile has been reviewed for ht/wt/allergies/indication/available labs.  - weight  115 kg on 09/11/17 - height 71 inches  A one time order has been placed for vancomycin 2000 mg IV x1.  Further antibiotics/pharmacy consults should be ordered by admitting physician if indicated.                       Thank you, Lynelle Doctor 10/09/2017  1:26 PM

## 2017-10-09 NOTE — Progress Notes (Signed)
Pt gave permission for his wife to be in the room while nursing admission history questions were asked. Lucius Conn BSN, RN-BC Admissions RN 10/09/2017 3:57 PM

## 2017-10-09 NOTE — ED Notes (Signed)
ED TO INPATIENT HANDOFF REPORT  Name/Age/Gender Travis Ferguson 77 y.o. male  Code Status Code Status History    Date Active Date Inactive Code Status Order ID Comments User Context   09/02/2017 1835 09/08/2017 1820 Full Code [id number]  Karmen Bongo, MD ED   02/24/2015 1316 02/27/2015 1356 Full Code [id number]  Erline Hau, MD ED   01/23/2015 [phone] 01/23/2015 2105 Full Code [id number]  Lorretta Harp, MD Inpatient   11/07/2014 1745 11/10/2014 1349 Full Code [id number]  Kinnie Feil, MD Inpatient   11/07/2014 1232 11/07/2014 1745 Full Code [id number]  Jacqulynn Cadet, MD Inpatient   08/16/2014 1255 08/25/2014 1740 Full Code [id number]  Donne Hazel, MD ED    Advance Directive Documentation     Most Recent Value  Type of Advance Directive  Healthcare Power of [place], Living will  Pre-existing out of facility DNR order (yellow form or pink MOST form)  -  "MOST" Form in Place?  -      Home/SNF/Other Home  Chief Complaint abdnormal labs  sent by dr   Level of Care/Admitting Diagnosis ED Disposition    ED Disposition Condition Comment   [place]: [facility] [[id number]]  Level of Care: Telemetry [5]  Admit to tele based on following criteria: Monitor for Ischemic changes  Diagnosis: HCAP (healthcare-associated pneumonia) [378588]  Admitting Physician: Kayleen Memos [[id number]]  Attending Physician: Kayleen Memos [[id number]]  Estimated length of stay: past midnight tomorrow  Certification:: I certify this patient will need inpatient services for at least 2 midnights  PT Class (Do Not Modify): Inpatient [101]  PT Acc Code (Do Not Modify): Private [1]       Medical History Past Medical History:  Diagnosis Date  . Acute on chronic combined systolic and diastolic CHF (congestive heart failure) ([place]) 02/24/2015  . Anxiety   . Arthritis    "about q joint I've got" (09/22/2015)  . Atherosclerosis   . CAP (community acquired pneumonia) 02/24/2015   . Cervical disc disease   . Chronic lower back pain    chronic back pain/under pain management  . Colon polyps 08/02/2014   Tubular adenoma x 3, Hyperplastic-1  . COPD (chronic obstructive pulmonary disease) ([place])    a. Former Airline pilot, also smoked a pipe.  . Coronary artery calcification seen on CAT scan   . Depression   . Dyslipidemia   . Dyspnea    chronic  . GERD (gastroesophageal reflux disease)   . Hiatal hernia    sensation problem ("right lateral side hip to knee").  . History of blood transfusion "numerous"  . History of oxygen administration    @ 2 l/m nasally at bedtime.(09/22/2015)  . Hypertension   . Inguinal hernia    right side  . Migraine    "years since I've had one" (09/22/2015)  . OSA (obstructive sleep apnea)    does not use CPAP; "just oxygen" (09/22/2015)  . PTSD (post-traumatic stress disorder)   . Rhinitis   . Sarcoidosis   . Sensation problem    right side-lateral hip to knee" decreased sensation and tingling feeling" -has informed Dr. Joylene Draft, Dr. Henrene Pastor, Dr. Lucia Gaskins of this.  . Sepsis ([place])   . Steroid-induced hyperglycemia 02/25/2015  . Subdural hematoma ([place]) 11/10   a. 2010 s/p surgery - diagnosed several weeks after a fall.    Allergies Allergies  Allergen Reactions  . Statins Other (See Comments)    Muscle aches - tolerating Vytorin  IV Location/Drains/Wounds Patient Lines/Drains/Airways Status   Active Line/Drains/Airways    Name:   Placement date:   Placement time:   Site:   Days:   Peripheral IV 10/09/17 Left Antecubital   10/09/17    1149    Antecubital   less than 1   Incision - 4 Ports Abdomen Umbilicus Left;Mid Left;Upper Right   11/08/14    4098     1066          Labs/Imaging Results for orders placed or performed during the hospital encounter of 10/09/17 (from the past 48 hour(s))  Comprehensive metabolic panel     Status: Abnormal   Collection Time: 10/09/17 11:50 AM  Result Value Ref Range   Sodium 137 135 - 145  mmol/L   Potassium 3.8 3.5 - 5.1 mmol/L   Chloride 99 (L) 101 - 111 mmol/L   CO2 24 22 - 32 mmol/L   Glucose, Bld 143 (H) 65 - 99 mg/dL   BUN 15 6 - 20 mg/dL   Creatinine, Ser 1.07 0.61 - 1.24 mg/dL   Calcium 9.0 8.9 - 10.3 mg/dL   Total Protein 6.8 6.5 - 8.1 g/dL   Albumin 2.8 (L) 3.5 - 5.0 g/dL   AST 14 (L) 15 - 41 U/L   ALT 12 (L) 17 - 63 U/L   Alkaline Phosphatase 74 38 - 126 U/L   Total Bilirubin 0.8 0.3 - 1.2 mg/dL   GFR calc non Af Amer >60 >60 mL/min   GFR calc Af Amer >60 >60 mL/min    Comment: (NOTE) The eGFR has been calculated using the CKD EPI equation. This calculation has not been validated in all clinical situations. eGFR's persistently <60 mL/min signify possible Chronic Kidney Disease.    Anion gap 14 5 - 15    Comment: Performed at [facility], [place] [address], [place], [place] [address]  CBC WITH DIFFERENTIAL     Status: Abnormal   Collection Time: 10/09/17 11:50 AM  Result Value Ref Range   WBC 14.2 (H) 4.0 - 10.5 K/uL   RBC 4.33 4.22 - 5.81 MIL/uL   Hemoglobin 12.0 (L) 13.0 - 17.0 g/dL   HCT 36.1 (L) 39.0 - 52.0 %   MCV 83.4 78.0 - 100.0 fL   MCH 27.7 26.0 - 34.0 pg   MCHC 33.2 30.0 - 36.0 g/dL   RDW 14.4 11.5 - 15.5 %   Platelets 464 (H) 150 - 400 K/uL   Neutrophils Relative % 86 %   Lymphocytes Relative 8 %   Monocytes Relative 5 %   Eosinophils Relative 1 %   Basophils Relative 0 %   Neutro Abs 12.3 (H) 1.7 - 7.7 K/uL   Lymphs Abs 1.1 0.7 - 4.0 K/uL   Monocytes Absolute 0.7 0.1 - 1.0 K/uL   Eosinophils Absolute 0.1 0.0 - 0.7 K/uL   Basophils Absolute 0.0 0.0 - 0.1 K/uL   WBC Morphology MILD LEFT SHIFT (1-5% METAS, OCC MYELO, OCC BANDS)     Comment: Performed at [facility], [place] [address], [place], [place] [address]  Troponin I (MHP)     Status: None   Collection Time: 10/09/17 11:50 AM  Result Value Ref Range   Troponin I <0.03 <0.03 ng/mL    Comment: Performed at [facility], [place] [address] [place], [place] [address]  Brain natriuretic peptide     Status: Abnormal   Collection Time: 10/09/17 11:50 AM  Result Value Ref Range  B Natriuretic Peptide 106.1 (H) 0.0 - 100.0 pg/mL    Comment: Performed at [facility], [place] [address], [place], [place] [address]   Dg Chest 2 View  Result Date: 10/09/2017 CLINICAL DATA:  Shortness of breath an weakness today. EXAM: CHEST - 2 VIEW COMPARISON:  09/08/2017 FINDINGS: The right chest remains clear. There is extensive pneumonia throughout the left lower lobe with volume loss. Probable associated effusion on the left. IMPRESSION: Extensive left lower lobe pneumonia with volume loss. Probable associated pleural fluid. Electronically Signed   By: Nelson Chimes M.D.   On: 10/09/2017 11:24    Pending Labs Unresulted Labs (From admission, onward)   Start     Ordered   10/09/17 1323  Culture, blood (routine x 2) Call MD if unable to obtain prior to antibiotics being given  BLOOD CULTURE X 2,   R    Comments:  If blood cultures drawn in Emergency Department - Do not draw and cancel order    10/09/17 1323      Vitals/Pain Today's Vitals   10/09/17 1352 10/09/17 1400 10/09/17 1430 10/09/17 1500  BP: 124/85 125/89 (!) 122/98 (!) 148/103  Pulse: 89 81 94 93  Resp: 16 (!) 21 (!) 21 (!) 23  Temp:      TempSrc:      SpO2: 95% 93% 96% 95%  PainSc:        Isolation Precautions No active isolations  Medications Medications  ceFEPIme (MAXIPIME) 1 g in sodium chloride 0.9 % 100 mL IVPB (0 g Intravenous Stopped 10/09/17 1430)  ipratropium-albuterol (DUONEB) 0.5-2.5 (3) MG/3ML nebulizer solution 3 mL (3 mLs Nebulization Not Given 10/09/17 1621)  ipratropium-albuterol (DUONEB) 0.5-2.5 (3) MG/3ML nebulizer solution 3 mL (has no administration in time range)  sodium chloride HYPERTONIC 3 % nebulizer solution 4 mL (4 mLs Nebulization Not Given 10/09/17 1621)  guaiFENesin (MUCINEX) 12 hr tablet 1,200 mg (has no  administration in time range)  mometasone-formoterol (DULERA) 200-5 MCG/ACT inhaler 2 puff (has no administration in time range)  amiodarone (PACERONE) tablet 200 mg (has no administration in time range)  atorvastatin (LIPITOR) tablet 20 mg (has no administration in time range)  benzonatate (TESSALON) capsule 200 mg (has no administration in time range)  cholecalciferol (VITAMIN D) tablet 1,000 Units (has no administration in time range)  DULoxetine (CYMBALTA) DR capsule 30 mg (has no administration in time range)  metoprolol tartrate (LOPRESSOR) tablet 25 mg (has no administration in time range)  HYDROcodone-acetaminophen (NORCO/VICODIN) 5-325 MG per tablet 1 tablet (has no administration in time range)  FLUoxetine (PROZAC) capsule 40 mg (has no administration in time range)  finasteride (PROSCAR) tablet 5 mg (has no administration in time range)  methylPREDNISolone sodium succinate (SOLU-MEDROL) 40 mg/mL injection 40 mg (has no administration in time range)  vancomycin (VANCOCIN) 2,000 mg in sodium chloride 0.9 % 500 mL IVPB (0 mg Intravenous Stopped 10/09/17 1616)    Mobility walks

## 2017-10-09 NOTE — ED Triage Notes (Signed)
Pt sent from Dr. Silvestre Mesi office for left sided pleural effusion and WBC of [address]. Pt has hx of CHF and COPD. Pt states he feels dizzy and has shortness of breath.

## 2017-10-09 NOTE — Procedures (Signed)
Ultrasound-guided diagnostic and therapeutic left thoracentesis performed yielding 1.2 liters of hazy, yellow fluid. No immediate complications. Follow-up chest x-ray pending.The fluid was sent to the lab for preordered studies.

## 2017-10-09 NOTE — ED Provider Notes (Addendum)
[facility] DEPT Provider Note   CSN: [id number] Arrival date & time: 10/09/17  1020     History   Chief Complaint Chief Complaint  Patient presents with  . Pleural Effusion  . Abnormal Lab    WBC 16000    HPI Travis Ferguson is a 77 y.o. male.  HPI Presents with concern of dyspnea, fatigue. Patient has multiple medical issues including, congestive heart failure, COPD, sarcoidosis with pleural involvement. He also has recent hospitalization for pneumonia. He now presents due to a recurrence of symptoms similar to those prior to his pneumonia diagnosis Patient spoke with his physician, had x-ray performed as an outpatient, and was sent here. Patient is here with his wife who assists with the HPI. He denies confusion, disorientation, fever, acknowledge worsening dyspnea, and inability to perform activities of daily living. Patient is a former pipe smoker, and was a Airline pilot for 30 years.  Past Medical History:  Diagnosis Date  . Acute on chronic combined systolic and diastolic CHF (congestive heart failure) ([place]) 02/24/2015  . Anxiety   . Arthritis    "about q joint I've got" (09/22/2015)  . Atherosclerosis   . CAP (community acquired pneumonia) 02/24/2015  . Cervical disc disease   . Chronic lower back pain    chronic back pain/under pain management  . Colon polyps 08/02/2014   Tubular adenoma x 3, Hyperplastic-1  . COPD (chronic obstructive pulmonary disease) ([place])    a. Former Airline pilot, also smoked a pipe.  . Coronary artery calcification seen on CAT scan   . Depression   . Dyslipidemia   . Dyspnea    chronic  . GERD (gastroesophageal reflux disease)   . Hiatal hernia    sensation problem ("right lateral side hip to knee").  . History of blood transfusion "numerous"  . History of oxygen administration    @ 2 l/m nasally at bedtime.(09/22/2015)  . Hypertension   . Inguinal hernia    right side  . Migraine    "years since I've  had one" (09/22/2015)  . OSA (obstructive sleep apnea)    does Travis use CPAP; "just oxygen" (09/22/2015)  . PTSD (post-traumatic stress disorder)   . Rhinitis   . Sarcoidosis   . Sensation problem    right side-lateral hip to knee" decreased sensation and tingling feeling" -has informed Dr. Joylene Draft, Dr. Henrene Pastor, Dr. Lucia Gaskins of this.  . Sepsis ([place])   . Steroid-induced hyperglycemia 02/25/2015  . Subdural hematoma ([place]) 11/10   a. 2010 s/p surgery - diagnosed several weeks after a fall.    Patient Active Problem List   Diagnosis Date Noted  . Sepsis ([place]) 09/02/2017  . Tachycardia 09/02/2017  . Left lower lobe pneumonia ([place]) 09/02/2017  . Chronic pain syndrome 09/02/2017  . Typical atrial flutter ([place])   . Nonischemic cardiomyopathy ([place])   . S/P shoulder replacement 09/22/2015  . Acute on chronic respiratory failure with hypoxia ([place]) 02/27/2015  . Acute on chronic combined systolic and diastolic CHF (congestive heart failure) ([place]) 02/24/2015  . CAP (community acquired pneumonia) 02/24/2015  . COPD with acute exacerbation ([place]) 02/24/2015  . Left ventricular dysfunction 01/20/2015  . S/P laparoscopic cholecystectomy 12/27/2014  . Other emphysema ([place])   . Pleural effusion 03/17/2014  . Splenic artery aneurysm ([place]) 04/27/2013  . Coronary artery calcification seen on CAT scan 04/26/2013  . Essential hypertension 04/26/2013  . Hyperlipidemia 04/26/2013  . Post-nasal drip 03/15/2013  . 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 Obstructive sleep apnea  05/11/2007  . COPD with chronic bronchitis ([place]) 05/11/2007    Past Surgical History:  Procedure Laterality Date  . BACK SURGERY    . BRAIN SURGERY  04/2009   right frontal"hemorrhage evacuation from fall injury"  . CARDIAC CATHETERIZATION N/A 01/23/2015   Procedure: Right/Left Heart Cath and Coronary Angiography;  Surgeon: Lorretta Harp, MD;  Location: [facility];  Service: Cardiovascular;  Laterality: N/A;  .  CHOLECYSTECTOMY N/A 11/08/2014   Procedure: LAPAROSCOPIC CHOLECYSTECTOMY ;  Surgeon: Alphonsa Overall, MD;  Location: [facility];  Service: General;  Laterality: N/A;  . ERCP N/A 11/07/2014   Procedure: ENDOSCOPIC RETROGRADE CHOLANGIOPANCREATOGRAPHY (ERCP);  Surgeon: Irene Shipper, MD;  Location: [facility];  Service: Endoscopy;  Laterality: N/A;  . GALLBLADDER SURGERY  09/2014   drain tube placed  . INGUINAL HERNIA REPAIR Left   . KNEE ARTHROSCOPY Right   . LUMBAR LAMINECTOMY/DECOMPRESSION MICRODISCECTOMY Right 12/15/2012   Procedure: LUMBAR LAMINECTOMY/DECOMPRESSION MICRODISCECTOMY 1 LEVEL;  Surgeon: Elaina Hoops, MD;  Location: [facility];  Service: Neurosurgery;  Laterality: Right;  Right Lumbar four-five laminectomy/foraminotomy  . REVERSE SHOULDER ARTHROPLASTY Left 09/22/2015   Procedure: LEFT REVERSE TOTAL SHOULDER ARTHROPLASTY;  Surgeon: Netta Cedars, MD;  Location: [facility];  Service: Orthopedics;  Laterality: Left;  . REVERSE TOTAL SHOULDER ARTHROPLASTY Left 09/22/2015  . VIDEO BRONCHOSCOPY  07/28/2012   Procedure: VIDEO BRONCHOSCOPY WITH FLUORO;  Surgeon: Chesley Mires, MD;  Location: [facility];  Service: Cardiopulmonary;  Laterality: Bilateral;        Home Medications    Prior to Admission medications   Medication Sig Start Date End Date Taking? Authorizing Provider  acetaminophen (TYLENOL) 650 MG CR tablet Take 650 mg by mouth daily as needed for pain.   Yes [provider]  albuterol (PROAIR HFA) 108 (90 Base) MCG/ACT inhaler 1-2 PUFFS EVERY 4-6 HOURS AS NEEDED for shortness of breath 08/08/17  Yes Chesley Mires, MD  amiodarone (PACERONE) 200 MG tablet Take 1 tablet (200 mg total) by mouth daily. 09/11/17  Yes Lorretta Harp, MD  atorvastatin (LIPITOR) 20 MG tablet Take 1 tablet (20 mg total) by mouth daily at 6 PM. 09/08/17  Yes Dessa Phi, DO  benzonatate (TESSALON) 200 MG capsule Take 1 capsule (200 mg total) by mouth 3 (three) times daily as needed for cough. 09/22/17  Yes  Chesley Mires, MD  budesonide-formoterol (SYMBICORT) 160-4.5 MCG/ACT inhaler Inhale 2 puffs into the lungs 2 (two) times daily. 07/15/17  Yes Parrett, Tammy S, NP  chlorhexidine (PERIDEX) 0.12 % solution chlorhexidine gluconate 0.12 % mouthwash   Yes [provider]  Cholecalciferol (VITAMIN D PO) Take 1 tablet by mouth daily.   Yes [provider]  DULoxetine (CYMBALTA) 30 MG capsule Take 30 mg by mouth daily.   Yes [provider]  ENTRESTO 24-26 MG TAKE 1 TABLET BY MOUTH 2 (TWO) TIMES DAILY. 05/14/16  Yes Lorretta Harp, MD  finasteride (PROSCAR) 5 MG tablet Take 5 mg by mouth daily.  01/07/13  Yes [provider]  FLUoxetine (PROZAC) 40 MG capsule Take 40 mg by mouth daily with breakfast.    Yes [provider]  furosemide (LASIX) 20 MG tablet Take 1 tablet (20 mg total) by mouth 2 (two) times daily. 09/08/17  Yes Dessa Phi, DO  HYDROcodone-acetaminophen (NORCO/VICODIN) 5-325 MG tablet Take 1 tablet by mouth every 8 (eight) hours as needed for moderate pain.    Yes [provider]  metoprolol tartrate (LOPRESSOR) 25 MG tablet TAKE 1  TABLET (25 MG TOTAL) BY MOUTH 2 (TWO) TIMES DAILY. 09/11/15  Yes Lorretta Harp, MD  OXYGEN Inhale 2 L/min into the lungs at bedtime.   Yes [provider]  sodium chloride (OCEAN) 0.65 % SOLN nasal spray Place 1-2 sprays into both nostrils daily as needed for congestion.   Yes [provider]  ipratropium (ATROVENT) 0.02 % nebulizer solution Take 2.5 mLs (0.5 mg total) by nebulization 4 (four) times daily. Patient Travis taking: Reported on 10/09/2017 07/15/17   Parrett, Fonnie Mu, NP  predniSONE (DELTASONE) 10 MG tablet Take 4 tabs for 3 days, then 3 tabs for 3 days, then 2 tabs for 3 days, then 1 tab for 3 days, then 1/2 tab for 4 days. Patient Travis taking: Reported on 10/09/2017 09/08/17   Dessa Phi, DO  SYMBICORT 160-4.5 MCG/ACT inhaler INHALE 2 PUFFS INTO THE LUNGS 2 (TWO) TIMES  DAILY. Patient Travis taking: Reported on 10/09/2017 11/06/16   Chesley Mires, MD    Family History Family History  Problem Relation Age of Onset  . Heart disease Mother   . Heart disease Father   . Colon polyps Brother   . Congestive Heart Failure Brother   . Coronary artery disease Unknown   . Stomach cancer Paternal Grandmother   . Colon cancer Neg Hx   . Pancreatic cancer Neg Hx   . Rectal cancer Neg Hx   . Esophageal cancer Neg Hx     Social History Social History   Tobacco Use  . Smoking status: Former Smoker    Packs/day: 0.00    Years: 30.00    Pack years: 0.00    Types: Pipe    Last attempt to quit: 06/24/1998    Years since quitting: 19.3  . Smokeless tobacco: Never Used  Substance Use Topics  . Alcohol use: No  . Drug use: No     Allergies   Statins   Review of Systems Review of Systems  Constitutional:       Per HPI, otherwise negative  HENT:       Per HPI, otherwise negative  Respiratory:       Per HPI, otherwise negative  Cardiovascular:       Per HPI, otherwise negative  Gastrointestinal: Negative for vomiting.  Endocrine:       Negative aside from HPI  Genitourinary:       Neg aside from HPI   Musculoskeletal:       Per HPI, otherwise negative  Skin: Negative.   Neurological: Positive for weakness. Negative for syncope.     Physical Exam Updated Vital Signs BP 115/83   Pulse 88   Temp 97.8 F (36.6 C) (Oral)   Resp 16   SpO2 95%   Physical Exam  Constitutional: He is oriented to person, place, and time. He appears well-developed. No distress.  HENT:  Head: Normocephalic and atraumatic.  Eyes: Conjunctivae and EOM are normal.  Cardiovascular: Normal rate and regular rhythm.  Pulmonary/Chest: He has decreased breath sounds.  Abdominal: He exhibits no distension.  Musculoskeletal: He exhibits no edema.  Neurological: He is alert and oriented to person, place, and time.  Skin: Skin is warm and dry.  Psychiatric: He has a normal  mood and affect.  Nursing note and vitals reviewed.    ED Treatments / Results  Labs (all labs ordered are listed, but only abnormal results are displayed) Labs Reviewed  COMPREHENSIVE METABOLIC PANEL - Abnormal; Notable for the following components:  Result Value   Chloride 99 (*)    Glucose, Bld 143 (*)    Albumin 2.8 (*)    AST 14 (*)    ALT 12 (*)    All other components within normal limits  CBC WITH DIFFERENTIAL/PLATELET - Abnormal; Notable for the following components:   WBC 14.2 (*)    Hemoglobin 12.0 (*)    HCT 36.1 (*)    Platelets 464 (*)    Neutro Abs 12.3 (*)    All other components within normal limits  BRAIN NATRIURETIC PEPTIDE - Abnormal; Notable for the following components:   B Natriuretic Peptide 106.1 (*)    All other components within normal limits  CULTURE, BLOOD (ROUTINE X 2)  CULTURE, BLOOD (ROUTINE X 2)  TROPONIN I    EKG EKG Interpretation  Date/Time:  Thursday October 09 2017 11:46:53 EDT Ventricular Rate:  98 PR Interval:    QRS Duration: 104 QT Interval:  357 QTC Calculation: 456 R Axis:   63 Text Interpretation:  Sinus rhythm Artifact Baseline wander poor study, abnormal ecg Confirmed by Carmin Muskrat [phone]) on 10/09/2017 11:49:37 AM   Radiology Dg Chest 2 View  Result Date: 10/09/2017 CLINICAL DATA:  Shortness of breath an weakness today. EXAM: CHEST - 2 VIEW COMPARISON:  09/08/2017 FINDINGS: The right chest remains clear. There is extensive pneumonia throughout the left lower lobe with volume loss. Probable associated effusion on the left. IMPRESSION: Extensive left lower lobe pneumonia with volume loss. Probable associated pleural fluid. Electronically Signed   By: Nelson Chimes M.D.   On: 10/09/2017 11:24    Procedures Procedures (including critical care time)  Medications Ordered in ED Medications  ceFEPIme (MAXIPIME) 1 g in sodium chloride 0.9 % 100 mL IVPB (has no administration in time range)     Initial Impression /  Assessment and Plan / ED Course  I have reviewed the triage vital signs and the nursing notes.  Pertinent labs & imaging results that were available during my care of the patient were reviewed by me and considered in my medical decision making (see chart for details).  After the initial evaluation I reviewed the patient's chart including documentation from pneumonia, x-ray findings consistent with pleural effusion, recurred from a prior. 1:23 PM Patient in similar condition. X-ray reviewed, consistent with lower lobe pneumonia, with pleural effusion. Given the patient's recent episode of similar illness, hospitalization, patient will receive broad-spectrum antibiotic, cefepime.  Patient remains awake and alert. Patient will require admission for further evaluation, management.  (Late addendum) - patient found to have elevated troponin as well.  Though his ECG was non-ischemic, with his weakness in general and description of increasing DOE w CP, there was consideration of ongoing demand ischemia.  Final Clinical Impressions(s) / ED Diagnoses  Healthcare acquired pneumonia   Carmin Muskrat, MD 10/09/17 1324  CRITICAL CARE Performed by: Carmin Muskrat Total critical care time: 35 minutes Critical care time was exclusive of separately billable procedures and treating other patients. Critical care was necessary to treat or prevent imminent or life-threatening deterioration. Critical care was time spent personally by me on the following activities: development of treatment plan with patient and/or surrogate as well as nursing, discussions with consultants, evaluation of patient's response to treatment, examination of patient, obtaining history from patient or surrogate, ordering and performing treatments and interventions, ordering and review of laboratory studies, ordering and review of radiographic studies, pulse oximetry and re-evaluation of patient's condition.     Carmin Muskrat,  MD 10/20/17  1828  

## 2017-10-09 NOTE — ED Notes (Signed)
Patient transported to X-ray 

## 2017-10-09 NOTE — H&P (Signed)
History and Physical  Travis Ferguson GYI:[id number] DOB: Aug 15, 1940 DOA: 10/09/2017  Referring physician: Dr Vanita Panda  PCP: Crist Infante, MD  Outpatient Specialists:  Patient coming from: Home  Chief Complaint: Generalized weakness and dyspnea.  HPI: Travis Ferguson is a 77 y.o. male with medical history significant for nonischemic cardiomyopathy, OSA, COPD, pulmonary sarcoidosis, chronic pain syndrome, ambulatory dysfunction with physical debility, who presented to [facility] ED with complaints of gradually worsening generalized weakness and dyspnea of 3 weeks duration.  Associated with a nonproductive cough.  Also reports frequent falls.  Last fall was 1 week ago.  He denies any chest pain or palpitations.    ED Course: Chest x-ray done in the ED personally reviewed revealed large left pleural effusion with left infiltrates with suspicion for pneumonia.  Leukocytosis with white count 14 with left shift.  Started on IV vancomycin and IV cefepime in the ED.  Review of Systems: Review of systems as noted in the HPI.  All other systems reviewed and are negative.   Past Medical History:  Diagnosis Date  . Acute on chronic combined systolic and diastolic CHF (congestive heart failure) ([place]) 02/24/2015  . Anxiety   . Arthritis    "about q joint I've got" (09/22/2015)  . Atherosclerosis   . CAP (community acquired pneumonia) 02/24/2015  . Cervical disc disease   . Chronic lower back pain    chronic back pain/under pain management  . Colon polyps 08/02/2014   Tubular adenoma x 3, Hyperplastic-1  . COPD (chronic obstructive pulmonary disease) ([place])    a. Former Airline pilot, also smoked a pipe.  . Coronary artery calcification seen on CAT scan   . Depression   . Dyslipidemia   . Dyspnea    chronic  . GERD (gastroesophageal reflux disease)   . Hiatal hernia    sensation problem ("right lateral side hip to knee").  . History of blood transfusion "numerous"  . History of oxygen administration    @ 2 l/m nasally at bedtime.(09/22/2015)  . Hypertension   . Inguinal hernia    right side  . Migraine    "years since I've had one" (09/22/2015)  . OSA (obstructive sleep apnea)    does not use CPAP; "just oxygen" (09/22/2015)  . PTSD (post-traumatic stress disorder)   . Rhinitis   . Sarcoidosis   . Sensation problem    right side-lateral hip to knee" decreased sensation and tingling feeling" -has informed Dr. Joylene Draft, Dr. Henrene Pastor, Dr. Lucia Gaskins of this.  . Sepsis ([place])   . Steroid-induced hyperglycemia 02/25/2015  . Subdural hematoma ([place]) 11/10   a. 2010 s/p surgery - diagnosed several weeks after a fall.   Past Surgical History:  Procedure Laterality Date  . BACK SURGERY    . BRAIN SURGERY  04/2009   right frontal"hemorrhage evacuation from fall injury"  . CARDIAC CATHETERIZATION N/A 01/23/2015   Procedure: Right/Left Heart Cath and Coronary Angiography;  Surgeon: Lorretta Harp, MD;  Location: [facility];  Service: Cardiovascular;  Laterality: N/A;  . CHOLECYSTECTOMY N/A 11/08/2014   Procedure: LAPAROSCOPIC CHOLECYSTECTOMY ;  Surgeon: Alphonsa Overall, MD;  Location: [facility];  Service: General;  Laterality: N/A;  . ERCP N/A 11/07/2014   Procedure: ENDOSCOPIC RETROGRADE CHOLANGIOPANCREATOGRAPHY (ERCP);  Surgeon: Irene Shipper, MD;  Location: [facility];  Service: Endoscopy;  Laterality: N/A;  . GALLBLADDER SURGERY  09/2014   drain tube placed  . INGUINAL HERNIA REPAIR Left   . KNEE ARTHROSCOPY Right   . LUMBAR  LAMINECTOMY/DECOMPRESSION MICRODISCECTOMY Right 12/15/2012   Procedure: LUMBAR LAMINECTOMY/DECOMPRESSION MICRODISCECTOMY 1 LEVEL;  Surgeon: Elaina Hoops, MD;  Location: [place] [facility];  Service: Neurosurgery;  Laterality: Right;  Right Lumbar four-five laminectomy/foraminotomy  . REVERSE SHOULDER ARTHROPLASTY Left 09/22/2015   Procedure: LEFT REVERSE TOTAL SHOULDER ARTHROPLASTY;  Surgeon: Netta Cedars, MD;  Location: [facility];  Service: Orthopedics;  Laterality: Left;  . REVERSE TOTAL  SHOULDER ARTHROPLASTY Left 09/22/2015  . VIDEO BRONCHOSCOPY  07/28/2012   Procedure: VIDEO BRONCHOSCOPY WITH FLUORO;  Surgeon: Chesley Mires, MD;  Location: [facility];  Service: Cardiopulmonary;  Laterality: Bilateral;    Social History:  reports that he quit smoking about 19 years ago. His smoking use included pipe. He smoked 0.00 packs per day for 30.00 years. He has never used smokeless tobacco. He reports that he does not drink alcohol or use drugs.   Allergies  Allergen Reactions  . Statins Other (See Comments)    Muscle aches - tolerating Vytorin    Family History  Problem Relation Age of Onset  . Heart disease Mother   . Heart disease Father   . Colon polyps Brother   . Congestive Heart Failure Brother   . Coronary artery disease Unknown   . Stomach cancer Paternal Grandmother   . Colon cancer Neg Hx   . Pancreatic cancer Neg Hx   . Rectal cancer Neg Hx   . Esophageal cancer Neg Hx      Prior to Admission medications   Medication Sig Start Date End Date Taking? Authorizing Provider  acetaminophen (TYLENOL) 650 MG CR tablet Take 650 mg by mouth daily as needed for pain.   Yes [provider]  albuterol (PROAIR HFA) 108 (90 Base) MCG/ACT inhaler 1-2 PUFFS EVERY 4-6 HOURS AS NEEDED for shortness of breath 08/08/17  Yes Chesley Mires, MD  amiodarone (PACERONE) 200 MG tablet Take 1 tablet (200 mg total) by mouth daily. 09/11/17  Yes Lorretta Harp, MD  atorvastatin (LIPITOR) 20 MG tablet Take 1 tablet (20 mg total) by mouth daily at 6 PM. 09/08/17  Yes Dessa Phi, DO  benzonatate (TESSALON) 200 MG capsule Take 1 capsule (200 mg total) by mouth 3 (three) times daily as needed for cough. 09/22/17  Yes Chesley Mires, MD  budesonide-formoterol (SYMBICORT) 160-4.5 MCG/ACT inhaler Inhale 2 puffs into the lungs 2 (two) times daily. 07/15/17  Yes Parrett, Tammy S, NP  chlorhexidine (PERIDEX) 0.12 % solution chlorhexidine gluconate 0.12 % mouthwash   Yes [provider]    Cholecalciferol (VITAMIN D PO) Take 1 tablet by mouth daily.   Yes [provider]  DULoxetine (CYMBALTA) 30 MG capsule Take 30 mg by mouth daily.   Yes [provider]  ENTRESTO 24-26 MG TAKE 1 TABLET BY MOUTH 2 (TWO) TIMES DAILY. 05/14/16  Yes Lorretta Harp, MD  finasteride (PROSCAR) 5 MG tablet Take 5 mg by mouth daily.  01/07/13  Yes [provider]  FLUoxetine (PROZAC) 40 MG capsule Take 40 mg by mouth daily with breakfast.    Yes [provider]  furosemide (LASIX) 20 MG tablet Take 1 tablet (20 mg total) by mouth 2 (two) times daily. 09/08/17  Yes Dessa Phi, DO  HYDROcodone-acetaminophen (NORCO/VICODIN) 5-325 MG tablet Take 1 tablet by mouth every 8 (eight) hours as needed for moderate pain.    Yes [provider]  metoprolol tartrate (LOPRESSOR) 25 MG tablet TAKE 1 TABLET (25 MG TOTAL) BY MOUTH 2 (TWO) TIMES DAILY. 09/11/15  Yes Lorretta Harp,  MD  OXYGEN Inhale 2 L/min into the lungs at bedtime.   Yes [provider]  sodium chloride (OCEAN) 0.65 % SOLN nasal spray Place 1-2 sprays into both nostrils daily as needed for congestion.   Yes [provider]  ipratropium (ATROVENT) 0.02 % nebulizer solution Take 2.5 mLs (0.5 mg total) by nebulization 4 (four) times daily. Patient not taking: Reported on 10/09/2017 07/15/17   Parrett, Fonnie Mu, NP  predniSONE (DELTASONE) 10 MG tablet Take 4 tabs for 3 days, then 3 tabs for 3 days, then 2 tabs for 3 days, then 1 tab for 3 days, then 1/2 tab for 4 days. Patient not taking: Reported on 10/09/2017 09/08/17   Dessa Phi, DO  SYMBICORT 160-4.5 MCG/ACT inhaler INHALE 2 PUFFS INTO THE LUNGS 2 (TWO) TIMES DAILY. Patient not taking: Reported on 10/09/2017 11/06/16   Chesley Mires, MD    Physical Exam: BP 116/72 (BP Location: Right Arm)   Pulse 88   Temp 97.8 F (36.6 C) (Oral)   Resp 18   SpO2 98%   General: 77 year old Caucasian male well-developed well-nourished no acute  distress.  Alert and oriented x3. Eyes: Anicteric sclera ENT: Mucous membranes moist no erythema or exudates. Neck: Mild left JVD Cardiovascular: Regular rate and rhythm with no rubs or gallops. Respiratory: Mild crackles noted on right base Abdomen: Obese nontender nondistended normal bowel sounds x4 Skin: No ulcerative lesions noted Musculoskeletal: No noted focal motor deficits Psychiatric: Mood is appropriate for condition  Neurologic: Alert and oriented x3          Labs on Admission:  Basic Metabolic Panel: Recent Labs  Lab 10/09/17 1150  NA 137  K 3.8  CL 99*  CO2 24  GLUCOSE 143*  BUN 15  CREATININE 1.07  CALCIUM 9.0   Liver Function Tests: Recent Labs  Lab 10/09/17 1150  AST 14*  ALT 12*  ALKPHOS 74  BILITOT 0.8  PROT 6.8  ALBUMIN 2.8*   No results for input(s): LIPASE, AMYLASE in the last 168 hours. No results for input(s): AMMONIA in the last 168 hours. CBC: Recent Labs  Lab 10/09/17 1150  WBC 14.2*  NEUTROABS 12.3*  HGB 12.0*  HCT 36.1*  MCV 83.4  PLT 464*   Cardiac Enzymes: Recent Labs  Lab 10/09/17 1150  TROPONINI <0.03    BNP (last 3 results) Recent Labs    09/02/17 1315 10/09/17 1150  BNP 184.2* 106.1*    ProBNP (last 3 results) Recent Labs    08/08/17 0958  PROBNP 92.0    CBG: No results for input(s): GLUCAP in the last 168 hours.  Radiological Exams on Admission: Dg Chest 1 View  Result Date: 10/09/2017 CLINICAL DATA:  77 y/o  M; left thoracentesis. EXAM: CHEST  1 VIEW COMPARISON:  10/09/2017 chest radiograph FINDINGS: Stable cardiomegaly. Large left-sided pleural effusion with opacity of left mid and lower lung zones. A no appreciable pneumothorax. Clear right lung. Left shoulder reverse hemiarthroplasty is partially visualized. No acute osseous abnormality identified. IMPRESSION: Large left pleural effusion with opacification of left mid and lower lung zones. No appreciable pneumothorax. Electronically Signed   By:  Kristine Garbe M.D.   On: 10/09/2017 18:13   Dg Chest 2 View  Result Date: 10/09/2017 CLINICAL DATA:  Shortness of breath an weakness today. EXAM: CHEST - 2 VIEW COMPARISON:  09/08/2017 FINDINGS: The right chest remains clear. There is extensive pneumonia throughout the left lower lobe with volume loss. Probable associated effusion on the left. IMPRESSION: Extensive  left lower lobe pneumonia with volume loss. Probable associated pleural fluid. Electronically Signed   By: Nelson Chimes M.D.   On: 10/09/2017 11:24   US Thoracentesis Asp Pleural Space W/img Guide  Result Date: 10/09/2017 INDICATION: Patient with history of sarcoidosis, coronary artery disease, COPD, CHF, pneumonia, dyspnea, left pleural effusion. Request made for diagnostic and therapeutic left thoracentesis. EXAM: ULTRASOUND GUIDED DIAGNOSTIC AND THERAPEUTIC LEFT THORACENTESIS MEDICATIONS: None COMPLICATIONS: None immediate. PROCEDURE: An ultrasound guided thoracentesis was thoroughly discussed with the patient and questions answered. The benefits, risks, alternatives and complications were also discussed. The patient understands and wishes to proceed with the procedure. Written consent was obtained. Ultrasound was performed to localize and mark an adequate pocket of fluid in the left chest. The area was then prepped and draped in the normal sterile fashion. 1% Lidocaine was used for local anesthesia. Under ultrasound guidance a 6 Fr Safe-T-Centesis catheter was introduced. Thoracentesis was performed. The catheter was removed and a dressing applied. FINDINGS: A total of approximately 1.2 liters of hazy, yellow fluid was removed. Samples were sent to the laboratory as requested by the clinical team. IMPRESSION: Successful ultrasound guided diagnostic and therapeutic left thoracentesis yielding 1.2 liters of pleural fluid. Read by: Rowe Robert, PA-C Electronically Signed   By: Suzy Bouchard M.D.   On: 10/09/2017 17:40    EKG:  Independently reviewed.  Personally reviewed EKG which revealed sinus rhythm with rate of 98.  Assessment/Plan Present on Admission: . HCAP (healthcare-associated pneumonia)  Active Problems:   HCAP (healthcare-associated pneumonia)  HCAP, poa Recent hospitalization with community-acquired pneumonia Continue IV vancomycin and IV cefepime Sputum culture Blood culture Duo nebs every 6 hours and every 2 hours as needed IV Solu-Medrol 40 mg 3 times daily Pulmonary toilet with hypersaline nebs, Mucinex Continue Dulera Continue O2 supplementation to maintain O2 saturation 92% or greater  Large left pleural effusion of unclear etiology IR consulted for left thoracentesis.  Highly appreciated. 1.2 L of hazy yellow fluid removed sent for culture Continue to monitor O2 saturation Provide O2 supplementation as needed  Generalized weakness/physical debility PT to evaluate and treat Encourage increase of oral intake Fall precautions Obtain chemistry panel to evaluate electrolytes  Chronic pain syndrome Restarted home pain medications  Chronic depression/anxiety Continue Cymbalta, Prozac     DVT prophylaxis: Subcu Lovenox daily  Code Status: Full code  Family Communication: Wife at bedside  Disposition Plan: Admit to telemetry  Consults called: IR for left thoracentesis  Admission status: Inpatient status    Kayleen Memos MD [facility] Pager [phone]  If 7PM-7AM, please contact night-coverage [email or web address] Password [facility]  10/09/2017, 6:32 PM

## 2017-10-10 ENCOUNTER — Encounter (HOSPITAL_COMMUNITY): Payer: Self-pay | Admitting: Cardiology

## 2017-10-10 ENCOUNTER — Inpatient Hospital Stay (HOSPITAL_COMMUNITY): Payer: Medicare Other

## 2017-10-10 DIAGNOSIS — I480 Paroxysmal atrial fibrillation: Secondary | ICD-10-CM

## 2017-10-10 DIAGNOSIS — J438 Other emphysema: Secondary | ICD-10-CM

## 2017-10-10 DIAGNOSIS — J9621 Acute and chronic respiratory failure with hypoxia: Secondary | ICD-10-CM

## 2017-10-10 DIAGNOSIS — I5042 Chronic combined systolic (congestive) and diastolic (congestive) heart failure: Secondary | ICD-10-CM

## 2017-10-10 LAB — COMPREHENSIVE METABOLIC PANEL
ALK PHOS: 67 U/L (ref 38–126)
ALT: 11 U/L — AB (ref 17–63)
AST: 14 U/L — ABNORMAL LOW (ref 15–41)
Albumin: 2.7 g/dL — ABNORMAL LOW (ref 3.5–5.0)
Anion gap: 12 (ref 5–15)
BILIRUBIN TOTAL: 1.2 mg/dL (ref 0.3–1.2)
BUN: 15 mg/dL (ref 6–20)
CO2: 23 mmol/L (ref 22–32)
CREATININE: 0.91 mg/dL (ref 0.61–1.24)
Calcium: 9.2 mg/dL (ref 8.9–10.3)
Chloride: 99 mmol/L — ABNORMAL LOW (ref 101–111)
GFR calc Af Amer: >60 mL/min (ref >60)
Glucose, Bld: 182 mg/dL — ABNORMAL HIGH (ref 65–99)
Potassium: 4 mmol/L (ref 3.5–5.1)
Sodium: 134 mmol/L — ABNORMAL LOW (ref 135–145)
TOTAL PROTEIN: 6.7 g/dL (ref 6.5–8.1)

## 2017-10-10 LAB — CBC
HEMATOCRIT: 36.3 % — AB (ref 39.0–52.0)
Hemoglobin: 11.9 g/dL — ABNORMAL LOW (ref 13.0–17.0)
MCH: 27.2 pg (ref 26.0–34.0)
MCHC: 32.8 g/dL (ref 30.0–36.0)
MCV: 82.9 fL (ref 78.0–100.0)
Platelets: 435 10*3/uL — ABNORMAL HIGH (ref 150–400)
RBC: 4.38 MIL/uL (ref 4.22–5.81)
RDW: 14.6 % (ref 11.5–15.5)
WBC: 9.3 10*3/uL (ref 4.0–10.5)

## 2017-10-10 LAB — PROCALCITONIN

## 2017-10-10 LAB — LACTATE DEHYDROGENASE: LDH: 140 U/L (ref 98–192)

## 2017-10-10 LAB — MRSA PCR SCREENING: MRSA BY PCR: NEGATIVE

## 2017-10-10 LAB — STREP PNEUMONIAE URINARY ANTIGEN: STREP PNEUMO URINARY ANTIGEN: NEGATIVE

## 2017-10-10 LAB — PH, BODY FLUID: pH, Body Fluid: 7.5

## 2017-10-10 MED ORDER — APIXABAN 5 MG PO TABS
5.0000 mg | ORAL_TABLET | Freq: Two times a day (BID) | ORAL | Status: DC
  Administered 2017-10-10 – 2017-10-11 (×2): 5 mg via ORAL
  Filled 2017-10-10 (×2): qty 1

## 2017-10-10 MED ORDER — METHYLPREDNISOLONE SODIUM SUCC 40 MG IJ SOLR
40.0000 mg | Freq: Two times a day (BID) | INTRAMUSCULAR | Status: DC
  Administered 2017-10-10 – 2017-10-11 (×2): 40 mg via INTRAVENOUS
  Filled 2017-10-10 (×2): qty 1

## 2017-10-10 MED ORDER — FUROSEMIDE 20 MG PO TABS
20.0000 mg | ORAL_TABLET | Freq: Two times a day (BID) | ORAL | Status: DC
  Administered 2017-10-10 – 2017-10-13 (×6): 20 mg via ORAL
  Filled 2017-10-10 (×6): qty 1

## 2017-10-10 MED ORDER — VANCOMYCIN HCL 10 G IV SOLR
1500.0000 mg | INTRAVENOUS | Status: DC
  Filled 2017-10-10: qty 1500

## 2017-10-10 MED ORDER — IPRATROPIUM-ALBUTEROL 0.5-2.5 (3) MG/3ML IN SOLN
3.0000 mL | Freq: Three times a day (TID) | RESPIRATORY_TRACT | Status: DC
  Administered 2017-10-10 – 2017-10-11 (×4): 3 mL via RESPIRATORY_TRACT
  Filled 2017-10-10 (×4): qty 3

## 2017-10-10 MED ORDER — ENOXAPARIN SODIUM 60 MG/0.6ML ~~LOC~~ SOLN
60.0000 mg | SUBCUTANEOUS | Status: DC
  Administered 2017-10-10: 60 mg via SUBCUTANEOUS
  Filled 2017-10-10: qty 0.6

## 2017-10-10 MED ORDER — SACCHAROMYCES BOULARDII 250 MG PO CAPS
250.0000 mg | ORAL_CAPSULE | Freq: Two times a day (BID) | ORAL | Status: DC
  Administered 2017-10-10 – 2017-10-13 (×6): 250 mg via ORAL
  Filled 2017-10-10 (×6): qty 1

## 2017-10-10 MED ORDER — ENOXAPARIN SODIUM 40 MG/0.4ML ~~LOC~~ SOLN: 40.0000 mg | SUBCUTANEOUS | Status: DC

## 2017-10-10 MED ORDER — HYDROCODONE-ACETAMINOPHEN 5-325 MG PO TABS
1.0000 | ORAL_TABLET | Freq: Four times a day (QID) | ORAL | Status: DC
  Administered 2017-10-10 – 2017-10-13 (×12): 1 via ORAL
  Filled 2017-10-10 (×12): qty 1

## 2017-10-10 MED ORDER — SACUBITRIL-VALSARTAN 24-26 MG PO TABS
1.0000 | ORAL_TABLET | Freq: Two times a day (BID) | ORAL | Status: DC
  Administered 2017-10-10 – 2017-10-13 (×7): 1 via ORAL
  Filled 2017-10-10 (×8): qty 1

## 2017-10-10 MED ORDER — HYDROCODONE-ACETAMINOPHEN 5-325 MG PO TABS
1.0000 | ORAL_TABLET | Freq: Once | ORAL | Status: AC
Start: 2017-10-10 — End: 2017-10-10
  Administered 2017-10-10: 1 via ORAL
  Filled 2017-10-10: qty 1

## 2017-10-10 NOTE — Progress Notes (Signed)
Pharmacy Antibiotic Note  Travis Ferguson is a 77 y.o. male admitted on 10/09/2017 with HCAP.  Pharmacy has been consulted for vancomycin dosing.   Patient also receiving IV cefepime.   Patient received vancomycin 2000 mg IV loading dose in ED at approximately 1400 on 4/18.  Plan: Vancomycin 1500 mg IV q24h, next dose today at 1600.  (Estimated AUC 439 using SCr 1.07, AdjBW 113.6 kg) Cefepime 1 gram IV q8h as ordered by attending MD. Follow clinical course, renal function, culture results. Consider de-escalation when clinically appropriate.   Height: 6' (182.9 cm) Weight: 250 lb 7.1 oz (113.6 kg) IBW/kg (Calculated) : 77.6  Temp (24hrs), Avg:97.8 F (36.6 C), Min:97.7 F (36.5 C), Max:98.1 F (36.7 C)  Recent Labs  Lab 10/09/17 1150  WBC 14.2*  CREATININE 1.07    Estimated Creatinine Clearance: 75.2 mL/min (by C-G formula based on SCr of 1.07 mg/dL).    Allergies  Allergen Reactions  . Statins Other (See Comments)    Muscle aches - tolerating Vytorin    Antimicrobials this admission: 4/18 >> vancomycin 4/18 >> Zosyn  Dose adjustments this admission:   Microbiology results: 4/18 BCx: collected 4/18 Pleural fluid from L thoracentesis: collected  Thank you for allowing pharmacy to be a part of this patient's care.  Clayburn Pert, PharmD, BCPS [phone] 10/10/2017  6:21 AM

## 2017-10-10 NOTE — Progress Notes (Signed)
PROGRESS NOTE  Travis Ferguson UKG:[id number] DOB: 01/08/1941 DOA: 10/09/2017 PCP: Crist Infante, MD  Brief Summary:  He was hospitalized from 3/12 to 3/18 and  treated for left lower lobe pneumonia.   He developed generalized weakness/falls at home and dyspnea , he was evaluated by primary care doctor and is sent back to the hospital on 4/18 from primary care doctor Dr. Silvestre Mesi office due to left-sided pleural effusion and WBC 16,000.  Chest x-ray with large left-sided pleural effusion.  Underwent thoracentesis with 1.2 L KCl of fluids removed  He also has history of paroxysmal A. fib heart failure cardiology consulted  H/o copd on home 02 2liters.  He has history of sarcoidosis he is not on any medication for this He is followed by Dr. Halford Chessman     HPI/Recap of past 24 hours:  I stayed in his room for prolonged time with the rounding team He c/o shakiness and most of his conversation if centered around his pains meds for chronic pain  He denies chest pain, no sob at rest, I did not noticed any cough during encounter  No fever,   Assessment/Plan: Active Problems:   Dyspnea   HCAP (healthcare-associated pneumonia)   S/P thoracentesis  Acute on chronic hypoxic respiratory failure, baseline on home O2 2 L -He presented with dyspnea weakness found to have left sided pleural effusion ( he has left sided pleural effusion sicne 08/2016), he Underwent thoracentesis with 1.2 L KCl of fluids removed   Gram stain no organism,  Wbc elevated at 8126 with 77% lymps, 10% neurtophils fluids analysis  Has exudative features    Pleural Fluids total protein 3.5 ( serum total protein 6.7),    pleural fluids ldh 168 ( >2/3 of ldh upper limit 222), serum ldh was not obtained,   Fluids culture and cytology pending  - he is started on vanc/cefepime for HCAP, mrsa screening negative, d/c vanc, he currently does not appear septic, no fever. Wbc normalized today. Blood culture no growth. 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 Halford Chessman    PAF: Continue home meds lopressor/amiodarone.  -Cardiology consulted, he is started on apixaban. Management per cardiology  Combined CHF, NICM with improved EF on entresto for more than 77yrs, Non obstructive CAD per cath in 2016 entresto continued, bnp is not useful.  continue home meds lopressor and lasix. He does not have significant edema,  Cardiology consulted, meds adjustment per cardiology.  HLD; continue statin.   Depression/chronic pain: He is continued on home meds prozac, cymbalta. Continue home med norco q6hrs  I have reviewed pmp aware, he is getting prescriptions from Dr Suella Broad  He received norco 90tabs for 30days supply  on 3/5, then 90tabs for 30days supply on 4/4 then 120 tab for 30day supply on 4/10. See below:     Report generalized weakness and falls at home PT eval   OSA cpap qhs Body mass index is 33.97 kg/m.   Code Status: full  Family Communication: patient   Disposition Plan: home in 1-2 days, pending ct chest, pleural fludis culture  and procalcitonin level . Need cardiology clearance   Consultants:  cardiology  Procedures:  US  guided thoracentesis   Antibiotics:  vanc on 4/18  Cefepime from 4/18   Objective: BP 138/83 (BP Location: Right Arm)   Pulse (!) 108   Temp 97.7 F (36.5 C) (Oral)   Resp 18   Ht 6' (1.829 m)   Wt 113.6 kg (250 lb 7.1 oz)   SpO2 92%   BMI 33.97 kg/m   Intake/Output Summary (Last 24 hours) at 10/10/2017 1510 Last data filed at 10/10/2017 1326 Gross per 24 hour  Intake 980 ml  Output  1400 ml  Net -420 ml   Filed Weights   10/09/17 2112 10/10/17 0113 10/10/17 0609  Weight: 114.1 kg (251 lb 8.7 oz) 114.1 kg (251 lb 8.7 oz) 113.6 kg (250 lb 7.1 oz)    Exam: Patient is examined daily including today on 10/10/2017, exams remain the same as of yesterday except that has changed    General:  NAD  Cardiovascular: RRR  Respiratory: diminished left side, no wheezing, no rales, no rhonchi  Abdomen: Soft/ND/NT, positive BS  Musculoskeletal: No Edema  Neuro: alert, oriented   Data Reviewed: Basic Metabolic Panel: Recent Labs  Lab 10/09/17 1150 10/10/17 0549  NA 137 134*  K 3.8 4.0  CL 99* 99*  CO2 24 23  GLUCOSE 143* 182*  BUN 15 15  CREATININE 1.07 0.91  CALCIUM 9.0 9.2   Liver Function Tests: Recent Labs  Lab 10/09/17 1150 10/10/17 0549  AST 14* 14*  ALT 12* 11*  ALKPHOS 74 67  BILITOT 0.8 1.2  PROT 6.8 6.7  ALBUMIN 2.8* 2.7*   No results for input(s): LIPASE, AMYLASE in the last 168 hours. No results for input(s): AMMONIA in the last 168 hours. CBC: Recent Labs  Lab 10/09/17 1150 10/10/17 0549  WBC 14.2* 9.3  NEUTROABS 12.3*  --   HGB 12.0* 11.9*  HCT 36.1* 36.3*  MCV 83.4 82.9  PLT 464* 435*   Cardiac Enzymes:   Recent Labs  Lab 10/09/17 1150  TROPONINI <0.03   BNP (last 3 results) Recent Labs    09/02/17 1315 10/09/17 1150  BNP 184.2* 106.1*    ProBNP (last 3 results) Recent Labs    08/08/17 0958  PROBNP 92.0    CBG: No results for input(s): GLUCAP in the last 168 hours.  Recent Results (from the past 240 hour(s))  Culture, blood (routine x 2) Call MD if unable to obtain prior to antibiotics being given     Status: None (Preliminary result)   Collection Time: 10/09/17  1:50 PM  Result Value Ref Range Status   Specimen Description   Final    BLOOD RIGHT ANTECUBITAL Performed at [facility] [address] [place], [place] [address]    Special Requests   Final    BOTTLES DRAWN AEROBIC AND  ANAEROBIC Blood Culture adequate volume Performed at [facility] [address], [place], [place] [address]    Culture   Final    NO GROWTH < 24 HOURS Performed at [facility] [address], [place], [place] [address]    Report Status PENDING  Incomplete  Culture, blood (routine x 2) Call MD if unable to obtain prior to antibiotics being given     Status: None (Preliminary result)   Collection Time: 10/09/17  1:51 PM  Result Value Ref Range Status   Specimen Description   Final    BLOOD LEFT ANTECUBITAL Performed at [facility] [address], [place], [place] [address]  Special Requests   Final    BOTTLES DRAWN AEROBIC AND ANAEROBIC Blood Culture adequate volume Performed at [facility] [address] [place], [place] [address]    Culture   Final    NO GROWTH < 24 HOURS Performed at [facility] [address] [place], [place] [address]    Report Status PENDING  Incomplete  Culture, body fluid-bottle     Status: None (Preliminary result)   Collection Time: 10/09/17  5:28 PM  Result Value Ref Range Status   Specimen Description FLUID LEFT PLEURAL  Final   Special Requests BOTTLES DRAWN AEROBIC AND ANAEROBIC 10CC  Final   Culture   Final    NO GROWTH < 24 HOURS Performed at [facility], [place] [address] [place], [place] [address]    Report Status PENDING  Incomplete  Gram stain     Status: None   Collection Time: 10/09/17  5:28 PM  Result Value Ref Range Status   Specimen Description FLUID LEFT PLEURAL  Final   Special Requests NONE  Final   Gram Stain   Final    ABUNDANT WBC PRESENT, PREDOMINANTLY MONONUCLEAR NO ORGANISMS SEEN Performed at [facility], [place] [address], [place],  [address]    Report Status 10/09/2017 FINAL  Final     Studies: Dg Chest 1 View  Result Date: 10/09/2017 CLINICAL DATA:  77 y/o  M; left thoracentesis. EXAM: CHEST  1 VIEW COMPARISON:   10/09/2017 chest radiograph FINDINGS: Stable cardiomegaly. Large left-sided pleural effusion with opacity of left mid and lower lung zones. A no appreciable pneumothorax. Clear right lung. Left shoulder reverse hemiarthroplasty is partially visualized. No acute osseous abnormality identified. IMPRESSION: Large left pleural effusion with opacification of left mid and lower lung zones. No appreciable pneumothorax. Electronically Signed   By: Kristine Garbe M.D.   On: 10/09/2017 18:13   US Thoracentesis Asp Pleural Space W/img Guide  Result Date: 10/09/2017 INDICATION: Patient with history of sarcoidosis, coronary artery disease, COPD, CHF, pneumonia, dyspnea, left pleural effusion. Request made for diagnostic and therapeutic left thoracentesis. EXAM: ULTRASOUND GUIDED DIAGNOSTIC AND THERAPEUTIC LEFT THORACENTESIS MEDICATIONS: None COMPLICATIONS: None immediate. PROCEDURE: An ultrasound guided thoracentesis was thoroughly discussed with the patient and questions answered. The benefits, risks, alternatives and complications were also discussed. The patient understands and wishes to proceed with the procedure. Written consent was obtained. Ultrasound was performed to localize and mark an adequate pocket of fluid in the left chest. The area was then prepped and draped in the normal sterile fashion. 1% Lidocaine was used for local anesthesia. Under ultrasound guidance a 6 Fr Safe-T-Centesis catheter was introduced. Thoracentesis was performed. The catheter was removed and a dressing applied. FINDINGS: A total of approximately 1.2 liters of hazy, yellow fluid was removed. Samples were sent to the laboratory as requested by the clinical team. IMPRESSION: Successful ultrasound guided diagnostic and therapeutic left thoracentesis yielding 1.2 liters of pleural fluid. Read by: Rowe Robert, PA-C Electronically Signed   By: Suzy Bouchard M.D.   On: 10/09/2017 17:40    Scheduled Meds: . amiodarone  200 mg  Oral Daily  . apixaban  5 mg Oral BID  . atorvastatin  20 mg Oral q1800  . cholecalciferol  1,000 Units Oral Daily  . DULoxetine  30 mg Oral Daily  . finasteride  5 mg Oral Daily  . FLUoxetine  40 mg Oral Q breakfast  . furosemide  20 mg Oral BID  .  guaiFENesin  1,200 mg Oral BID  . HYDROcodone-acetaminophen  1 tablet Oral Q6H  . ipratropium-albuterol  3 mL Nebulization TID  . methylPREDNISolone (SOLU-MEDROL) injection  40 mg Intravenous TID  . metoprolol tartrate  25 mg Oral BID  . mometasone-formoterol  2 puff Inhalation BID  . sacubitril-valsartan  1 tablet Oral BID  . sodium chloride HYPERTONIC  4 mL Nebulization TID    Continuous Infusions: . ceFEPime (MAXIPIME) IV Stopped (10/10/17 1417)  . vancomycin       Time spent: 35 mins I have personally reviewed and interpreted on  10/10/2017 daily labs, tele strips, imagings as discussed above under date review session and assessment and plans.  I reviewed all nursing notes, pharmacy notes, consultant notes,  vitals, pertinent old records  I have discussed plan of care as described above with RN , patient and family on 10/10/2017   Florencia Reasons MD, PhD  [facility] Pager [phone]. If 7PM-7AM, please contact night-coverage at [email or web address], password [facility] 10/10/2017, 3:10 PM  LOS: 1 day

## 2017-10-10 NOTE — Progress Notes (Signed)
[place] for Apixaban Indication: atrial fibrillation  Allergies  Allergen Reactions  . Statins Other (See Comments)    Muscle aches - tolerating Vytorin   Patient Measurements: Height: 6' (182.9 cm) Weight: 250 lb 7.1 oz (113.6 kg) IBW/kg (Calculated) : 77.6  Vital Signs: Temp: 97.7 F (36.5 C) (04/19 1303) Temp Source: Oral (04/19 1303) BP: 138/83 (04/19 1303) Pulse Rate: 108 (04/19 1303)  Labs: Recent Labs    10/09/17 1150 10/10/17 0549  HGB 12.0* 11.9*  HCT 36.1* 36.3*  PLT 464* 435*  CREATININE 1.07 0.91  TROPONINI <0.03  --    Estimated Creatinine Clearance: 88.5 mL/min (by C-G formula based on SCr of 0.91 mg/dL).  Medical History: Past Medical History:  Diagnosis Date  . Acute on chronic combined systolic and diastolic CHF (congestive heart failure) ([place]) 02/24/2015  . Anxiety   . Arthritis    "about q joint I've got" (09/22/2015)  . Atherosclerosis   . CAP (community acquired pneumonia) 02/24/2015  . Cervical disc disease   . Chronic lower back pain    chronic back pain/under pain management  . Colon polyps 08/02/2014   Tubular adenoma x 3, Hyperplastic-1  . COPD (chronic obstructive pulmonary disease) ([place])    a. Former Air[employer] pilot, also smoked a pipe.  . Coronary artery calcification seen on CAT scan   . Depression   . Dyslipidemia   . Dyspnea    chronic  . GERD (gastroesophageal reflux disease)   . Hiatal hernia    sensation problem ("right lateral side hip to knee").  . History of blood transfusion "numerous"  . History of oxygen administration    @ 2 l/m nasally at bedtime.(09/22/2015)  . Hypertension   . Inguinal hernia    right side  . Migraine    "years since I've had one" (09/22/2015)  . OSA (obstructive sleep apnea)    does not use CPAP; "just oxygen" (09/22/2015)  . PTSD (post-traumatic stress disorder)   . Rhinitis   . Sarcoidosis   . Sensation problem    right side-lateral hip to knee"  decreased sensation and tingling feeling" -has informed Dr. Joylene Draft, Dr. Henrene Pastor, Dr. Lucia Gaskins of this.  . Sepsis ([place])   . Steroid-induced hyperglycemia 02/25/2015  . Subdural hematoma ([place]) 11/10   a. 2010 s/p surgery - diagnosed several weeks after a fall.   Medications:  Scheduled:  . amiodarone  200 mg Oral Daily  . apixaban  5 mg Oral BID  . atorvastatin  20 mg Oral q1800  . cholecalciferol  1,000 Units Oral Daily  . DULoxetine  30 mg Oral Daily  . finasteride  5 mg Oral Daily  . FLUoxetine  40 mg Oral Q breakfast  . guaiFENesin  1,200 mg Oral BID  . HYDROcodone-acetaminophen  1 tablet Oral Q6H  . ipratropium-albuterol  3 mL Nebulization TID  . methylPREDNISolone (SOLU-MEDROL) injection  40 mg Intravenous TID  . metoprolol tartrate  25 mg Oral BID  . mometasone-formoterol  2 puff Inhalation BID  . sacubitril-valsartan  1 tablet Oral BID  . sodium chloride HYPERTONIC  4 mL Nebulization TID   Assessment: 63 yoM admit 4/18 with dyspnea, weakness, falls. Hx recent CAP. Cards consult: continue Amiodarone, resume Apixaban  PMH: non-ischemic cardiomyopathy, COPD, OSA, Sarcoid.  Previous anti-coagulation with Apixaban for Afib.  Goal of Therapy:  Monitor platelets by anticoagulation protocol: Yes   Plan:   Discontinued Lovenox  Apixaban 5mg  bid  Monitor CBC  Monitor for s/s bleed  Minda Ditto PharmD Pager [phone] 10/10/2017, 2:14 PM

## 2017-10-10 NOTE — Progress Notes (Signed)
Patient had 4 beat run of Vtach. MD notified. No acute distress.

## 2017-10-10 NOTE — Consult Note (Addendum)
Cardiology Consultation:   Patient ID: CRIAG WICKLUND; [id number]; [birth year]   Admit date: 10/09/2017 Date of Consult: 10/10/2017  Primary Care Provider: Crist Infante, MD Primary Cardiologist: Quay Burow, MD  Primary Electrophysiologist:  NA   Patient Profile:   Travis Ferguson is a 77 y.o. male with a hx of a flutter in combination with PNA, and maintaining SR, hx non ischemic cardiomyopathy 2016, nonobstructive CAD by cath and gradual increase of EF to currently normal, COPD, OSA, pulmonary sarcoidosis who is being seen today for the evaluation of medication management with admit for weakness, falls, and dyspnea at the request of Dr. Erlinda Hong.  History of Present Illness:   Travis Ferguson a hx of a flutter in combination with PNA, and maintaining SR, hx non ischemic cardiomyopathy 2016, nonobstructive CAD by cath and gradual increase of EF to currently normal, COPD, OSA, pulmonary sarcoidosis now admitted with weakness and dyspnea dx of PNA.  Was also hospitalized in March for LLL PNA as well.   He also has large Lt pleural effusion, thoracentesis with removal of 1.2 L hazy yellow fluid on oxygen here.    EKG I personally reviewed SR with artifact and wavy baseline.  No acute changes.  Tele I personally reviewed SR with 4 beats NSVT,  Troponin <0.03 BNP 106 Na 134, K+ 4.0, Cr 0.91 H/H 11.9/36.3 WBC on admit 14.2 now 9.3 plts 464  2V CXR  Extensive left lower lobe pneumonia with volume loss. Probable associated pleural fluid  Single V CXR 1753  Large left pleural effusion with opacification of left mid and lower lung zones. No appreciable pneumothorax  BP 127/88/ to 152/87   Currently on vanc and cefepime.   Pt has been on entresto since 2016 and with this and BB EF has slowly improved.  He continues on amiodarone and has had brief episodes of a fib with RVR on tele since admit.  Currently in SR.      Past Medical History:  Diagnosis Date  . Acute on chronic combined systolic and  diastolic CHF (congestive heart failure) ([place]) 02/24/2015  . Anxiety   . Arthritis    "about q joint I've got" (09/22/2015)  . Atherosclerosis   . CAP (community acquired pneumonia) 02/24/2015  . Cervical disc disease   . Chronic lower back pain    chronic back pain/under pain management  . Colon polyps 08/02/2014   Tubular adenoma x 3, Hyperplastic-1  . COPD (chronic obstructive pulmonary disease) ([place])    a. Former Airline pilot, also smoked a pipe.  . Coronary artery calcification seen on CAT scan   . Depression   . Dyslipidemia   . Dyspnea    chronic  . GERD (gastroesophageal reflux disease)   . Hiatal hernia    sensation problem ("right lateral side hip to knee").  . History of blood transfusion "numerous"  . History of oxygen administration    @ 2 l/m nasally at bedtime.(09/22/2015)  . Hypertension   . Inguinal hernia    right side  . Migraine    "years since I've had one" (09/22/2015)  . OSA (obstructive sleep apnea)    does not use CPAP; "just oxygen" (09/22/2015)  . PTSD (post-traumatic stress disorder)   . Rhinitis   . Sarcoidosis   . Sensation problem    right side-lateral hip to knee" decreased sensation and tingling feeling" -has informed Dr. Joylene Draft, Dr. Henrene Pastor, Dr. Lucia Gaskins of this.  . Sepsis ([place])   . Steroid-induced hyperglycemia 02/25/2015  . Subdural  hematoma ([place]) 11/10   a. 2010 s/p surgery - diagnosed several weeks after a fall.    Past Surgical History:  Procedure Laterality Date  . BACK SURGERY    . BRAIN SURGERY  04/2009   right frontal"hemorrhage evacuation from fall injury"  . CARDIAC CATHETERIZATION N/A 01/23/2015   Procedure: Right/Left Heart Cath and Coronary Angiography;  Surgeon: Lorretta Harp, MD;  Location: [facility];  Service: Cardiovascular;  Laterality: N/A;  . CHOLECYSTECTOMY N/A 11/08/2014   Procedure: LAPAROSCOPIC CHOLECYSTECTOMY ;  Surgeon: Alphonsa Overall, MD;  Location: [facility];  Service: General;  Laterality: N/A;  . ERCP N/A  11/07/2014   Procedure: ENDOSCOPIC RETROGRADE CHOLANGIOPANCREATOGRAPHY (ERCP);  Surgeon: Irene Shipper, MD;  Location: [facility];  Service: Endoscopy;  Laterality: N/A;  . GALLBLADDER SURGERY  09/2014   drain tube placed  . INGUINAL HERNIA REPAIR Left   . KNEE ARTHROSCOPY Right   . LUMBAR LAMINECTOMY/DECOMPRESSION MICRODISCECTOMY Right 12/15/2012   Procedure: LUMBAR LAMINECTOMY/DECOMPRESSION MICRODISCECTOMY 1 LEVEL;  Surgeon: Elaina Hoops, MD;  Location: [facility];  Service: Neurosurgery;  Laterality: Right;  Right Lumbar four-five laminectomy/foraminotomy  . REVERSE SHOULDER ARTHROPLASTY Left 09/22/2015   Procedure: LEFT REVERSE TOTAL SHOULDER ARTHROPLASTY;  Surgeon: Netta Cedars, MD;  Location: [facility];  Service: Orthopedics;  Laterality: Left;  . REVERSE TOTAL SHOULDER ARTHROPLASTY Left 09/22/2015  . VIDEO BRONCHOSCOPY  07/28/2012   Procedure: VIDEO BRONCHOSCOPY WITH FLUORO;  Surgeon: Chesley Mires, MD;  Location: [facility];  Service: Cardiopulmonary;  Laterality: Bilateral;     Home Medications:  Prior to Admission medications   Medication Sig Start Date End Date Taking? Authorizing Provider  acetaminophen (TYLENOL) 650 MG CR tablet Take 650 mg by mouth daily as needed for pain.   Yes [provider]  albuterol (PROAIR HFA) 108 (90 Base) MCG/ACT inhaler 1-2 PUFFS EVERY 4-6 HOURS AS NEEDED for shortness of breath 08/08/17  Yes Chesley Mires, MD  amiodarone (PACERONE) 200 MG tablet Take 1 tablet (200 mg total) by mouth daily. 09/11/17  Yes Lorretta Harp, MD  atorvastatin (LIPITOR) 20 MG tablet Take 1 tablet (20 mg total) by mouth daily at 6 PM. 09/08/17  Yes Dessa Phi, DO  benzonatate (TESSALON) 200 MG capsule Take 1 capsule (200 mg total) by mouth 3 (three) times daily as needed for cough. 09/22/17  Yes Chesley Mires, MD  budesonide-formoterol (SYMBICORT) 160-4.5 MCG/ACT inhaler Inhale 2 puffs into the lungs 2 (two) times daily. 07/15/17  Yes Parrett, Tammy S, NP  chlorhexidine  (PERIDEX) 0.12 % solution chlorhexidine gluconate 0.12 % mouthwash   Yes [provider]  Cholecalciferol (VITAMIN D PO) Take 1 tablet by mouth daily.   Yes [provider]  DULoxetine (CYMBALTA) 30 MG capsule Take 30 mg by mouth daily.   Yes [provider]  ENTRESTO 24-26 MG TAKE 1 TABLET BY MOUTH 2 (TWO) TIMES DAILY. 05/14/16  Yes Lorretta Harp, MD  finasteride (PROSCAR) 5 MG tablet Take 5 mg by mouth daily.  01/07/13  Yes [provider]  FLUoxetine (PROZAC) 40 MG capsule Take 40 mg by mouth daily with breakfast.    Yes [provider]  furosemide (LASIX) 20 MG tablet Take 1 tablet (20 mg total) by mouth 2 (two) times daily. 09/08/17  Yes Dessa Phi, DO  HYDROcodone-acetaminophen (NORCO/VICODIN) 5-325 MG tablet Take 1 tablet by mouth every 8 (eight) hours as needed for moderate pain.    Yes [provider]  metoprolol tartrate (LOPRESSOR) 25 MG tablet  TAKE 1 TABLET (25 MG TOTAL) BY MOUTH 2 (TWO) TIMES DAILY. 09/11/15  Yes Lorretta Harp, MD  OXYGEN Inhale 2 L/min into the lungs at bedtime.   Yes [provider]  sodium chloride (OCEAN) 0.65 % SOLN nasal spray Place 1-2 sprays into both nostrils daily as needed for congestion.   Yes [provider]  ipratropium (ATROVENT) 0.02 % nebulizer solution Take 2.5 mLs (0.5 mg total) by nebulization 4 (four) times daily. Patient not taking: Reported on 10/09/2017 07/15/17   Parrett, Fonnie Mu, NP  predniSONE (DELTASONE) 10 MG tablet Take 4 tabs for 3 days, then 3 tabs for 3 days, then 2 tabs for 3 days, then 1 tab for 3 days, then 1/2 tab for 4 days. Patient not taking: Reported on 10/09/2017 09/08/17   Dessa Phi, DO  SYMBICORT 160-4.5 MCG/ACT inhaler INHALE 2 PUFFS INTO THE LUNGS 2 (TWO) TIMES DAILY. Patient not taking: Reported on 10/09/2017 11/06/16   Chesley Mires, MD    Inpatient Medications: Scheduled Meds: . amiodarone  200 mg Oral Daily  . atorvastatin  20 mg Oral  q1800  . cholecalciferol  1,000 Units Oral Daily  . DULoxetine  30 mg Oral Daily  . enoxaparin (LOVENOX) injection  60 mg Subcutaneous Q24H  . finasteride  5 mg Oral Daily  . FLUoxetine  40 mg Oral Q breakfast  . guaiFENesin  1,200 mg Oral BID  . ipratropium-albuterol  3 mL Nebulization TID  . methylPREDNISolone (SOLU-MEDROL) injection  40 mg Intravenous TID  . metoprolol tartrate  25 mg Oral BID  . mometasone-formoterol  2 puff Inhalation BID  . sodium chloride HYPERTONIC  4 mL Nebulization TID   Continuous Infusions: . ceFEPime (MAXIPIME) IV Stopped (10/10/17 1601)  . vancomycin     PRN Meds: benzonatate, HYDROcodone-acetaminophen, ipratropium-albuterol  Allergies:    Allergies  Allergen Reactions  . Statins Other (See Comments)    Muscle aches - tolerating Vytorin    Social History:   Social History   Socioeconomic History  . Marital status: Married    Spouse name: Not on file  . Number of children: 2  . Years of education: 79  . Highest education level: Not on file  Occupational History  . Occupation: retired Airline pilot in [employer]  . Financial resource strain: Not on file  . Food insecurity:    Worry: Not on file    Inability: Not on file  . Transportation needs:    Medical: Not on file    Non-medical: Not on file  Tobacco Use  . Smoking status: Former Smoker    Packs/day: 0.00    Years: 30.00    Pack years: 0.00    Types: Pipe    Last attempt to quit: 06/24/1998    Years since quitting: 19.3  . Smokeless tobacco: Never Used  Substance and Sexual Activity  . Alcohol use: No  . Drug use: No  . Sexual activity: Not Currently  Lifestyle  . Physical activity:    Days per week: Not on file    Minutes per session: Not on file  . Stress: Not on file  Relationships  . Social connections:    Talks on phone: Not on file    Gets together: Not on file    Attends religious service: Not on file    Active member of club or organization: Not on file      Attends meetings of clubs or organizations: Not on file    Relationship  status: Not on file  . Intimate partner violence:    Fear of current or ex partner: Not on file    Emotionally abused: Not on file    Physically abused: Not on file    Forced sexual activity: Not on file  Other Topics Concern  . Not on file  Social History Narrative   Drinks about 6-7 caffeine drinks a day     Family History:    Family History  Problem Relation Age of Onset  . Heart disease Mother   . Heart disease Father   . Colon polyps Brother   . Congestive Heart Failure Brother   . Coronary artery disease Unknown   . Stomach cancer Paternal Grandmother   . Colon cancer Neg Hx   . Pancreatic cancer Neg Hx   . Rectal cancer Neg Hx   . Esophageal cancer Neg Hx      ROS:  Please see the history of present illness.  General:no colds or fevers, no weight changes Skin:no rashes or ulcers HEENT:no blurred vision, no congestion CV:see HPI PUL:see HPI GI:no diarrhea constipation or melena, no indigestion GU:no hematuria, no dysuria MS:no joint pain, no claudication Neuro:no syncope, no lightheadedness Endo:no diabetes, no thyroid disease  All other ROS reviewed and negative.     Physical Exam/Data:   Vitals:   10/09/17 2112 10/10/17 0113 10/10/17 0609 10/10/17 0800  BP: 127/88  (!) 152/87   Pulse: 95  94   Resp: 17  18   Temp: 97.7 F (36.5 C)  98.1 F (36.7 C)   TempSrc: Oral  Oral   SpO2: 95%  96% 94%  Weight: 251 lb 8.7 oz (114.1 kg) 251 lb 8.7 oz (114.1 kg) 250 lb 7.1 oz (113.6 kg)   Height:  6' (1.829 m)      Intake/Output Summary (Last 24 hours) at 10/10/2017 1155 Last data filed at 10/10/2017 0940 Gross per 24 hour  Intake 1080 ml  Output 1000 ml  Net 80 ml   Filed Weights   10/09/17 2112 10/10/17 0113 10/10/17 0609  Weight: 251 lb 8.7 oz (114.1 kg) 251 lb 8.7 oz (114.1 kg) 250 lb 7.1 oz (113.6 kg)   Body mass index is 33.97 kg/m.  General:  Well nourished, well  developed, in no acute distress HEENT: normal Lymph: no adenopathy Neck: no JVD Endocrine:  No thryomegaly Vascular: No carotid bruits; pedal pulses 2+ bilaterally   Cardiac:  normal S1, S2; RRR; no murmur gallup rub or click Lungs:  Diminished to auscultation bilaterally but especially Lt base, occ wheezing, no rhonchi or rales  Abd: soft, nontender, no hepatomegaly  Ext: no edema Musculoskeletal:  No deformities, BUE and BLE strength normal and equal Skin: warm and dry  Neuro:  Alert and oriented X 3 MAE follows commands, no focal abnormalities noted Psych:  Normal affect    Relevant CV Studies: Echo 09/04/17   Study Conclusions  - Left ventricle: The cavity size was normal. Wall thickness was   increased in a pattern of mild LVH. Systolic function was low   normal to mildly reduced. The estimated ejection fraction was in   the range of 50% to 55%. Images were inadequate for LV wall   motion assessment. Doppler parameters are consistent with   abnormal left ventricular relaxation (grade 1 diastolic   dysfunction). - Aortic valve: There was no stenosis. - Mitral valve: There was trivial regurgitation. - Right ventricle: The cavity size was normal. Systolic function   was normal. -  Tricuspid valve: Peak RV-RA gradient (S): 39 mm Hg. - Pulmonary arteries: PA peak pressure: 47 mm Hg (S). - Systemic veins: IVC measured 2.1 cm with >50% respirophasic   variation, suggesting RA pressure 8 mmHg. - Pericardium, extracardiac: Pleural effusion noted.  Impressions:  - Technically difficult study with poor acoustic windows. Normal LV   size with mild LV hypertrophy. Low normal to mildly reduced   systolic function, EF 42-68%. Normal RV size and systolic   function. Mild pulmonary hypertension.  Echo 04/2016 Study Conclusions  - Left ventricle: The cavity size was normal. There was moderate   hypertrophy of the septum with mild posterior wall hypertrophy.   Systolic function  was mildly to moderately reduced. The estimated   ejection fraction was in the range of 40% to 45%. Wall motion was   normal; there were no regional wall motion abnormalities. Doppler   parameters are consistent with abnormal left ventricular   relaxation (grade 1 diastolic dysfunction). - Aortic valve: Transvalvular velocity was within the normal range.   There was no stenosis. There was trivial regurgitation. - Mitral valve: Transvalvular velocity was within the normal range.   There was no evidence for stenosis. There was trivial   regurgitation. - Left atrium: The atrium was mildly dilated. - Right ventricle: Systolic function was normal. RV systolic   pressure (S, est): 29 mm Hg. - Atrial septum: No defect or patent foramen ovale was identified   by color flow Doppler. - Tricuspid valve: There was trivial regurgitation. - Pulmonic valve: There was trivial regurgitation. - Pulmonary arteries: Systolic pressure was within the normal   range. PA peak pressure: 29 mm Hg (S).  Impressions:  - Compared with echo 10/2015, LVEF is relatively unchaged. Systolic   function is difficult to assess and may be underestimated due to   frequent PVCs.   Laboratory Data:  Chemistry Recent Labs  Lab 10/09/17 1150 10/10/17 0549  NA 137 134*  K 3.8 4.0  CL 99* 99*  CO2 24 23  GLUCOSE 143* 182*  BUN 15 15  CREATININE 1.07 0.91  CALCIUM 9.0 9.2  GFRNONAA >60 >60  GFRAA >60 >60  ANIONGAP 14 12    Recent Labs  Lab 10/09/17 1150 10/10/17 0549  PROT 6.8 6.7  ALBUMIN 2.8* 2.7*  AST 14* 14*  ALT 12* 11*  ALKPHOS 74 67  BILITOT 0.8 1.2   Hematology Recent Labs  Lab 10/09/17 1150 10/10/17 0549  WBC 14.2* 9.3  RBC 4.33 4.38  HGB 12.0* 11.9*  HCT 36.1* 36.3*  MCV 83.4 82.9  MCH 27.7 27.2  MCHC 33.2 32.8  RDW 14.4 14.6  PLT 464* 435*   Cardiac Enzymes Recent Labs  Lab 10/09/17 1150  TROPONINI <0.03   No results for input(s): TROPIPOC in the last 168 hours.   BNP Recent Labs  Lab 10/09/17 1150  BNP 106.1*    DDimer No results for input(s): DDIMER in the last 168 hours.  Radiology/Studies:  Dg Chest 1 View  Result Date: 10/09/2017 CLINICAL DATA:  77 y/o  M; left thoracentesis. EXAM: CHEST  1 VIEW COMPARISON:  10/09/2017 chest radiograph FINDINGS: Stable cardiomegaly. Large left-sided pleural effusion with opacity of left mid and lower lung zones. A no appreciable pneumothorax. Clear right lung. Left shoulder reverse hemiarthroplasty is partially visualized. No acute osseous abnormality identified. IMPRESSION: Large left pleural effusion with opacification of left mid and lower lung zones. No appreciable pneumothorax. Electronically Signed   By: Edgardo Roys.D.  On: 10/09/2017 18:13   Dg Chest 2 View  Result Date: 10/09/2017 CLINICAL DATA:  Shortness of breath an weakness today. EXAM: CHEST - 2 VIEW COMPARISON:  09/08/2017 FINDINGS: The right chest remains clear. There is extensive pneumonia throughout the left lower lobe with volume loss. Probable associated effusion on the left. IMPRESSION: Extensive left lower lobe pneumonia with volume loss. Probable associated pleural fluid. Electronically Signed   By: Nelson Chimes M.D.   On: 10/09/2017 11:24   US Thoracentesis Asp Pleural Space W/img Guide  Result Date: 10/09/2017 INDICATION: Patient with history of sarcoidosis, coronary artery disease, COPD, CHF, pneumonia, dyspnea, left pleural effusion. Request made for diagnostic and therapeutic left thoracentesis. EXAM: ULTRASOUND GUIDED DIAGNOSTIC AND THERAPEUTIC LEFT THORACENTESIS MEDICATIONS: None COMPLICATIONS: None immediate. PROCEDURE: An ultrasound guided thoracentesis was thoroughly discussed with the patient and questions answered. The benefits, risks, alternatives and complications were also discussed. The patient understands and wishes to proceed with the procedure. Written consent was obtained. Ultrasound was performed to localize  and mark an adequate pocket of fluid in the left chest. The area was then prepped and draped in the normal sterile fashion. 1% Lidocaine was used for local anesthesia. Under ultrasound guidance a 6 Fr Safe-T-Centesis catheter was introduced. Thoracentesis was performed. The catheter was removed and a dressing applied. FINDINGS: A total of approximately 1.2 liters of hazy, yellow fluid was removed. Samples were sent to the laboratory as requested by the clinical team. IMPRESSION: Successful ultrasound guided diagnostic and therapeutic left thoracentesis yielding 1.2 liters of pleural fluid. Read by: Rowe Robert, PA-C Electronically Signed   By: Suzy Bouchard M.D.   On: 10/09/2017 17:40    Assessment and Plan:  1. NICM with improved EF over 3 years on Entresto, lopressor would continue both meds to help maintain improved EF if possible, euvolemic  2. PAF with PNA in March on amiodarone.  Dose decreased on office visit to 200 mg daily and if no further a fib then to stop 10/12/17 -has not been on anticoagulation though was on Eliquis prior to discharge in March.  --only brief a fib in March.  CHA2DS2VASc= 4 (age x 2, HTN, CAD) he does have hx of subdural hematoma and hematuria and recent fall.  Has BLED score of 2 .  He has had brief episodes of PAF here.  Would continue amiodarone and not stop for now.  Amiodarone long term will be issue for this pt. With COPD and Pulmonary sarcoidosis.    Dr. Percival Spanish to see. 3.  Large Lt pl effusion s/p thoracentesis removal of 1.2 L 4.  PNA per IM on ABX.    5. Non obstructive CAD per cath in 2016.  No angina  Discussed with Dr. Gwenlyn Found and will add back Eliquis.  He will see pt today.   For questions or updates, please contact [place] Please consult [email or web address] for contact info under Cardiology/STEMI.   Signed, Travis Kicks, NP  10/10/2017 11:55 AM   Agree with note written by Travis Ferguson RNP  Patient well-known to me.  I recently saw him in the  office 09/09/2017.  He has a history of nonischemic cardiomyopathy on appropriate medications with improvement in LV which he was treated for pneumonia.  At that time, we put him on Eliquis and amiodarone.  I did decrease the amiodarone dose during his recent office visit. I elected to stop the Eliquis since he was in sinus rhythm.  He is admitted this time for pneumonia as well.  He had a thoracentesis of 1.2 L.  He feels clinically improved.  He did have a short run of PAF, currently in sinus rhythm.  His exam is benign.  I favor restarting oral anticoagulation and maintaining him on amiodarone.  Quay Burow 10/10/2017 3:58 PM

## 2017-10-11 DIAGNOSIS — R0600 Dyspnea, unspecified: Secondary | ICD-10-CM

## 2017-10-11 DIAGNOSIS — R918 Other nonspecific abnormal finding of lung field: Secondary | ICD-10-CM

## 2017-10-11 DIAGNOSIS — J90 Pleural effusion, not elsewhere classified: Secondary | ICD-10-CM

## 2017-10-11 LAB — CBC WITH DIFFERENTIAL/PLATELET
Basophils Absolute: 0 10*3/uL (ref 0.0–0.1)
Basophils Relative: 0 %
EOS ABS: 0 10*3/uL (ref 0.0–0.7)
Eosinophils Relative: 0 %
HCT: 34.6 % — ABNORMAL LOW (ref 39.0–52.0)
HEMOGLOBIN: 11.1 g/dL — AB (ref 13.0–17.0)
LYMPHS ABS: 0.8 10*3/uL (ref 0.7–4.0)
Lymphocytes Relative: 6 %
MCH: 26.9 pg (ref 26.0–34.0)
MCHC: 32.1 g/dL (ref 30.0–36.0)
MCV: 83.8 fL (ref 78.0–100.0)
MONOS PCT: 3 %
Monocytes Absolute: 0.3 10*3/uL (ref 0.1–1.0)
NEUTROS PCT: 91 %
Neutro Abs: 11.3 10*3/uL — ABNORMAL HIGH (ref 1.7–7.7)
Platelets: 416 10*3/uL — ABNORMAL HIGH (ref 150–400)
RBC: 4.13 MIL/uL — ABNORMAL LOW (ref 4.22–5.81)
RDW: 14.5 % (ref 11.5–15.5)
WBC: 12.4 10*3/uL — ABNORMAL HIGH (ref 4.0–10.5)

## 2017-10-11 LAB — BASIC METABOLIC PANEL
Anion gap: 12 (ref 5–15)
BUN: 19 mg/dL (ref 6–20)
CHLORIDE: 101 mmol/L (ref 101–111)
CO2: 22 mmol/L (ref 22–32)
CREATININE: 0.99 mg/dL (ref 0.61–1.24)
Calcium: 9.2 mg/dL (ref 8.9–10.3)
GFR calc Af Amer: >60 mL/min (ref >60)
GFR calc non Af Amer: >60 mL/min (ref >60)
GLUCOSE: 185 mg/dL — AB (ref 65–99)
Potassium: 4 mmol/L (ref 3.5–5.1)
SODIUM: 135 mmol/L (ref 135–145)

## 2017-10-11 LAB — MAGNESIUM: MAGNESIUM: 1.7 mg/dL (ref 1.7–2.4)

## 2017-10-11 LAB — LACTIC ACID, PLASMA: Lactic Acid, Venous: 1.8 mmol/L (ref 0.5–1.9)

## 2017-10-11 LAB — PROCALCITONIN: Procalcitonin: <0.1 ng/mL

## 2017-10-11 MED ORDER — MAGNESIUM SULFATE 2 GM/50ML IV SOLN
2.0000 g | Freq: Once | INTRAVENOUS | Status: AC
  Administered 2017-10-11: 2 g via INTRAVENOUS
  Filled 2017-10-11: qty 50

## 2017-10-11 MED ORDER — IPRATROPIUM BROMIDE 0.02 % IN SOLN
0.5000 mg | Freq: Two times a day (BID) | RESPIRATORY_TRACT | Status: DC
  Administered 2017-10-11 – 2017-10-13 (×4): 0.5 mg via RESPIRATORY_TRACT
  Filled 2017-10-11 (×4): qty 2.5

## 2017-10-11 MED ORDER — AMPICILLIN-SULBACTAM SODIUM 3 (2-1) G IJ SOLR
3.0000 g | Freq: Four times a day (QID) | INTRAMUSCULAR | Status: DC
  Administered 2017-10-11 – 2017-10-12 (×5): 3 g via INTRAVENOUS
  Filled 2017-10-11 (×6): qty 3

## 2017-10-11 MED ORDER — PREDNISONE 20 MG PO TABS
40.0000 mg | ORAL_TABLET | Freq: Every day | ORAL | Status: DC
  Administered 2017-10-12: 40 mg via ORAL
  Filled 2017-10-11: qty 2

## 2017-10-11 MED ORDER — LEVALBUTEROL HCL 1.25 MG/0.5ML IN NEBU
1.2500 mg | INHALATION_SOLUTION | Freq: Two times a day (BID) | RESPIRATORY_TRACT | Status: DC
  Administered 2017-10-11 – 2017-10-13 (×4): 1.25 mg via RESPIRATORY_TRACT
  Filled 2017-10-11 (×4): qty 0.5

## 2017-10-11 NOTE — Progress Notes (Signed)
Subjective:  Sats still decreasing at night in in recumbent position no chest pain cough better   Objective:  Vitals:   10/10/17 2230 10/10/17 2234 10/11/17 0531 10/11/17 0753  BP:   (!) 136/91   Pulse:   78   Resp:      Temp:   (!) 97.5 F (36.4 C)   TempSrc:   Oral   SpO2: 93% 93% 93% 93%  Weight:   248 lb 14.4 oz (112.9 kg)   Height:        Intake/Output from previous day:  Intake/Output Summary (Last 24 hours) at 10/11/2017 0954 Last data filed at 10/11/2017 0755 Gross per 24 hour  Intake -  Output 1550 ml  Net -1550 ml    Physical Exam: Affect appropriate Chronically ill white male  HEENT: normal Neck supple with no adenopathy JVP normal no bruits no thyromegaly Lungs rhonchi and  wheezing and good diaphragmatic motion Heart:  S1/S2 no murmur, no rub, gallop or click PMI normal Abdomen: benighn, BS positve, no tenderness, no AAA no bruit.  No HSM or HJR Distal pulses intact with no bruits No edema Neuro non-focal Skin warm and dry No muscular weakness   Lab Results: Basic Metabolic Panel: Recent Labs    10/10/17 0549 10/11/17 0606  NA 134* 135  K 4.0 4.0  CL 99* 101  CO2 23 22  GLUCOSE 182* 185*  BUN 15 19  CREATININE 0.91 0.99  CALCIUM 9.2 9.2  MG  --  1.7   Liver Function Tests: Recent Labs    10/09/17 1150 10/10/17 0549  AST 14* 14*  ALT 12* 11*  ALKPHOS 74 67  BILITOT 0.8 1.2  PROT 6.8 6.7  ALBUMIN 2.8* 2.7*   No results for input(s): LIPASE, AMYLASE in the last 72 hours. CBC: Recent Labs    10/09/17 1150 10/10/17 0549 10/11/17 0606  WBC 14.2* 9.3 12.4*  NEUTROABS 12.3*  --  11.3*  HGB 12.0* 11.9* 11.1*  HCT 36.1* 36.3* 34.6*  MCV 83.4 82.9 83.8  PLT 464* 435* 416*   Cardiac Enzymes: Recent Labs    10/09/17 1150  TROPONINI <0.03    Imaging: Dg Chest 1 View  Result Date: 10/09/2017 CLINICAL DATA:  77 y/o  M; left thoracentesis. EXAM: CHEST  1 VIEW COMPARISON:  10/09/2017 chest radiograph FINDINGS: Stable  cardiomegaly. Large left-sided pleural effusion with opacity of left mid and lower lung zones. A no appreciable pneumothorax. Clear right lung. Left shoulder reverse hemiarthroplasty is partially visualized. No acute osseous abnormality identified. IMPRESSION: Large left pleural effusion with opacification of left mid and lower lung zones. No appreciable pneumothorax. Electronically Signed   By: Kristine Garbe M.D.   On: 10/09/2017 18:13   Dg Chest 2 View  Result Date: 10/09/2017 CLINICAL DATA:  Shortness of breath an weakness today. EXAM: CHEST - 2 VIEW COMPARISON:  09/08/2017 FINDINGS: The right chest remains clear. There is extensive pneumonia throughout the left lower lobe with volume loss. Probable associated effusion on the left. IMPRESSION: Extensive left lower lobe pneumonia with volume loss. Probable associated pleural fluid. Electronically Signed   By: Nelson Chimes M.D.   On: 10/09/2017 11:24   Ct Chest Wo Contrast  Result Date: 10/10/2017 CLINICAL DATA:  Pneumonia; history acute on chronic combined systolic and diastolic CHF, COPD, coronary artery disease, hypertension, sarcoidosis, former smoker EXAM: CT CHEST WITHOUT CONTRAST TECHNIQUE: Multidetector CT imaging of the chest was performed following the standard protocol without IV contrast. Sagittal and coronal  MPR images reconstructed from axial data set. COMPARISON:  Chest radiograph 10/09/2017, CT chest 07/12/2016 FINDINGS: Cardiovascular: Atherosclerotic calcifications aorta, proximal great vessels, and coronary arteries. No pericardial effusion. 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 Mediastinum/Nodes: Numerous calcified mediastinal and hilar lymph nodes again identified consistent with history of sarcoidosis, unchanged. No additional thoracic adenopathy. Esophagus unremarkable. Base of cervical region normal appearance. Lungs/Pleura: LEFT pleural effusion. Consolidation and volume loss in  LEFT lower lobe. Calcified pulmonary granulomata. Central peribronchial thickening. Hyperinflated/lucent LEFT upper lobe. Beaded appearance of airways in LEFT lower lobe consistent with bronchiectasis. Diffuse narrowing of the LEFT mainstem bronchus with occlusion of LEFT upper lobe and LEFT lower lobe bronchi at their origins versus previous exam. Large area of soft tissue opacity at the LEFT pulmonary hilum highly concerning for tumor, contiguous extending into mediastinum, associated with occlusion of the LEFT upper lobe and LEFT lower lobe bronchi and marked compression of the distal LEFT mainstem bronchus. Area of soft tissue opacity measures estimated 8.1 x 7.2 x 6.7 cm. Finding is concerning for malignancy invading the hilum and mediastinum. Upper Abdomen: Small cystic lesion with calcification at the RIGHT lobe of the liver 19 x 13 mm image 141. Duodenal diverticulum. Remaining visualized upper abdomen unremarkable. Musculoskeletal: Diffuse osseous demineralization. IMPRESSION: Chronic changes of sarcoidosis. Abnormal soft tissue opacity at the LEFT pulmonary hilum approximately 8.1 x 7.2 x 6.7 cm in size associated with occlusion of the distal LEFT mainstem bronchus and proximal LEFT upper lobe/LEFT lower lobe bronchi highly suspicious for malignancy invading mediastinum; bronchoscopic evaluation recommended. Postobstructive pneumonitis involving the LEFT lower lobe with associated LEFT lower lobe bronchiectasis and new LEFT pleural effusion. Mild aneurysmal dilatation of the ascending thoracic aorta 4.2 cm greatest size, recommendation below. Recommend annual imaging followup by CTA or MRA. This recommendation follows 2010 ACCF/AHA/AATS/ACR/ASA/SCA/SCAI/SIR/STS/SVM Guidelines for the Diagnosis and Management of Patients with Thoracic Aortic Disease. Circulation. 2010; 121: Z329-J242 Aortic Atherosclerosis (ICD10-I70.0). Electronically Signed   By: Lavonia Dana M.D.   On: 10/10/2017 17:19   US  Thoracentesis Asp Pleural Space W/img Guide  Result Date: 10/09/2017 INDICATION: Patient with history of sarcoidosis, coronary artery disease, COPD, CHF, pneumonia, dyspnea, left pleural effusion. Request made for diagnostic and therapeutic left thoracentesis. EXAM: ULTRASOUND GUIDED DIAGNOSTIC AND THERAPEUTIC LEFT THORACENTESIS MEDICATIONS: None COMPLICATIONS: None immediate. PROCEDURE: An ultrasound guided thoracentesis was thoroughly discussed with the patient and questions answered. The benefits, risks, alternatives and complications were also discussed. The patient understands and wishes to proceed with the procedure. Written consent was obtained. Ultrasound was performed to localize and mark an adequate pocket of fluid in the left chest. The area was then prepped and draped in the normal sterile fashion. 1% Lidocaine was used for local anesthesia. Under ultrasound guidance a 6 Fr Safe-T-Centesis catheter was introduced. Thoracentesis was performed. The catheter was removed and a dressing applied. FINDINGS: A total of approximately 1.2 liters of hazy, yellow fluid was removed. Samples were sent to the laboratory as requested by the clinical team. IMPRESSION: Successful ultrasound guided diagnostic and therapeutic left thoracentesis yielding 1.2 liters of pleural fluid. Read by: Rowe Robert, PA-C Electronically Signed   By: Suzy Bouchard M.D.   On: 10/09/2017 17:40    Cardiac Studies:  ECG: NSR rate 98 nonspecific ST changes    Telemetry:  NSR 10/11/2017   Echo: EF 50-55% estimated PA 47 mmHg  Medications:   . amiodarone  200 mg Oral Daily  . apixaban  5 mg Oral BID  .  atorvastatin  20 mg Oral q1800  . cholecalciferol  1,000 Units Oral Daily  . DULoxetine  30 mg Oral Daily  . finasteride  5 mg Oral Daily  . FLUoxetine  40 mg Oral Q breakfast  . furosemide  20 mg Oral BID  . guaiFENesin  1,200 mg Oral BID  . HYDROcodone-acetaminophen  1 tablet Oral Q6H  . ipratropium-albuterol  3 mL  Nebulization TID  . methylPREDNISolone (SOLU-MEDROL) injection  40 mg Intravenous Q12H  . metoprolol tartrate  25 mg Oral BID  . mometasone-formoterol  2 puff Inhalation BID  . saccharomyces boulardii  250 mg Oral BID  . sacubitril-valsartan  1 tablet Oral BID  . sodium chloride HYPERTONIC  4 mL Nebulization TID     . ceFEPime (MAXIPIME) IV Stopped (10/11/17 0606)    Assessment/Plan:   DCM:  Non ischemic EF improved with combination of beta blocker and entresto continue   Left Effusion: post thoracentesis BNP only 106 ? 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 Continue maxipime nebs and prednisone    Jenkins Rouge 10/11/2017, 9:54 AM

## 2017-10-11 NOTE — Progress Notes (Signed)
PROGRESS NOTE  Travis Ferguson TKW:[id number] DOB: 13-Apr-1941 DOA: 10/09/2017 PCP: Crist Infante, MD  Brief Summary:  He was hospitalized from 3/12 to 3/18 and  treated for left lower lobe pneumonia.   He developed generalized weakness/falls at home and dyspnea , he was evaluated by primary care doctor and is sent back to the hospital on 4/18 from primary care doctor Dr. Silvestre Mesi office due to left-sided pleural effusion and WBC 16,000.  Chest x-ray with large left-sided pleural effusion.  Underwent thoracentesis with 1.2 L KCl of fluids removed  He also has history of paroxysmal A. fib heart failure cardiology consulted  H/o copd on home 02 2liters.  He has history of sarcoidosis he is not on any medication for this He is followed by Dr. Halford Chessman     HPI/Recap of past 24 hours:  He reports hypoxia last night, He denies chest pain, no sob at rest, some intermittent dry cough No fever,    Assessment/Plan: Active Problems:   Dyspnea   HCAP (healthcare-associated pneumonia)   S/P thoracentesis  Acute on chronic hypoxic respiratory failure, baseline on home O2 2 L at night and prn with activity -He presented with dyspnea weakness found to have left sided pleural effusion ( he has left sided pleural effusion since 08/2016),  -he Underwent thoracentesis this admission on 4/18 with 1.2 L KCl of fluids removed   Gram stain no organism,  Wbc elevated at 8126 with 77% lymps, 10% neurtophils fluids analysis  Has exudative features    Pleural Fluids total protein 3.5 ( serum total protein 6.7),    pleural fluids ldh 168 ( >2/3 of ldh upper limit 222), serum ldh was not obtained,   Fluids gram stain negative for organism, fluids culture no growth so far,  fluids cytology pending  - urine strep pneumon antigen negative, serum procalcitonin less than 0.1. Blood culture no growth, he is afebrile, does not appear septic.   - chest CT "Abnormal soft tissue opacity at the LEFT pulmonary  hilum approximately 8.1 x 7.2 x 6.7 cm in size associated with occlusion of the distal LEFT mainstem bronchus and proximal LEFT upper lobe/LEFT lower lobe bronchi highly suspicious for malignancy invading mediastinum; bronchoscopic evaluation recommended. Postobstructive pneumonitis involving the LEFT lower lobe with associated LEFT lower lobe bronchiectasis and new LEFT pleural effusion."  - he is started on vanc/cefepime for HCAP on admission, mrsa screening negative, d/c vanc d/ced on 4/19. D/c cefepime , change to unasyn on 4/20  to cover postobstructive pna.  -I have consulted pulmonology Dr Elsworth Soho, per Dr Elsworth Soho will wait for pleural fluids cytology, hold apixaban for now in case needing bronchoscopy.   COPD;  There were documented wheezing on initial presentation, no wheezing during my exam for the last two days, he does has continued decreased breath sound on left side Taper steroids, change nebs to xopenex /atrovent bid  Continue mucinex/incentive spirometer  Pulmonary sarcoidosis  -Chronic changes of sarcoidosis on ct chest -not on meds for this chronically -Follows with Dr. Halford Chessman    PAF/NSVT, this could cause recent DOE as well Continue home meds lopressor/amiodarone.  -Cardiology consulted, he is started on apixaban. Management per cardiology  Combined CHF, NICM with improved EF on entresto for more than 89yrs, Non obstructive CAD per cath in 2016 CAD changes on CT chest as well. -entresto continued, bnp is not useful.  -continue home meds lopressor and lasix. -He does not have significant edema,  -Cardiology consulted, meds adjustment per cardiology.  Hypomagnesemia: replace mag  HLD; continue statin.   Depression/chronic pain: He is continued on home meds prozac, cymbalta. Continue home med norco q6hrs  I have reviewed pmp aware, he is getting prescriptions from Dr Suella Broad  He received norco 90tabs for 30days supply  on 3/5, then 90tabs for 30days supply on 4/4  then 120 tab for 30day supply on 4/10.    Report generalized weakness and falls at home PT eval   OSA cpap qhs Body mass index is 33.76 kg/m.    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 Recommend annual imaging followup by CTA or MRA. This recommendation follows 2010 ACCF/AHA/AATS/ACR/ASA/SCA/SCAI/SIR/STS/SVM Guidelines for the Diagnosis and Management of Patients with Thoracic Aortic Disease. Circulation. 2010; 121: L875-I433     Code Status: full  Family Communication: patient   Disposition Plan: pending cytology result, may need bronchoscopy, need pulmonology clearance   Consultants:   IR for thoracentesis  Cardiology  pulmonology  Procedures:  US guided thoracentesis on 4/18  Antibiotics:  vanc on 4/18  Cefepime from 4/18-4/19  unasyn from 4/19-   Objective: BP (!) 136/91 (BP Location: Right Arm)   Pulse 78   Temp (!) 97.5 F (36.4 C) (Oral)   Resp 18   Ht 6' (1.829 m)   Wt 112.9 kg (248 lb 14.4 oz)   SpO2 93%   BMI 33.76 kg/m   Intake/Output Summary (Last 24 hours) at 10/11/2017 0852 Last data filed at 10/11/2017 0755 Gross per 24 hour  Intake -  Output 1850 ml  Net -1850 ml   Filed Weights   10/10/17 0113 10/10/17 0609 10/11/17 0531  Weight: 114.1 kg (251 lb 8.7 oz) 113.6 kg (250 lb 7.1 oz) 112.9 kg (248 lb 14.4 oz)    Exam: Patient is examined daily including today on 10/11/2017, exams remain the same as of yesterday except that has changed    General:  NAD  Cardiovascular: RRR  Respiratory: diminished left side, no wheezing, no rales, no rhonchi  Abdomen: Soft/ND/NT, positive BS  Musculoskeletal: No Edema  Neuro: alert, oriented   Data Reviewed: Basic Metabolic Panel: Recent Labs  Lab 10/09/17 1150 10/10/17 0549 10/11/17 0606  NA 137 134* 135  K 3.8 4.0 4.0  CL 99* 99* 101  CO2 24 23 22   GLUCOSE 143* 182* 185*  BUN 15  15 19   CREATININE 1.07 0.91 0.99  CALCIUM 9.0 9.2 9.2  MG  --   --  1.7   Liver Function Tests: Recent Labs  Lab 10/09/17 1150 10/10/17 0549  AST 14* 14*  ALT 12* 11*  ALKPHOS 74 67  BILITOT 0.8 1.2  PROT 6.8 6.7  ALBUMIN 2.8* 2.7*   No results for input(s): LIPASE, AMYLASE in the last 168 hours. No results for input(s): AMMONIA in the last 168 hours. CBC: Recent Labs  Lab 10/09/17 1150 10/10/17 0549 10/11/17 0606  WBC 14.2* 9.3 12.4*  NEUTROABS 12.3*  --  11.3*  HGB 12.0* 11.9* 11.1*  HCT 36.1* 36.3* 34.6*  MCV 83.4 82.9 83.8  PLT 464* 435* 416*   Cardiac Enzymes:   Recent Labs  Lab 10/09/17 1150  TROPONINI <0.03   BNP (last 3 results) Recent Labs    09/02/17 1315 10/09/17 1150  BNP 184.2* 106.1*    ProBNP (last 3 results) Recent Labs    08/08/17 0958  PROBNP 92.0    CBG: No results for input(s): GLUCAP in the last  168 hours.  Recent Results (from the past 240 hour(s))  Culture, blood (routine x 2) Call MD if unable to obtain prior to antibiotics being given     Status: None (Preliminary result)   Collection Time: 10/09/17  1:50 PM  Result Value Ref Range Status   Specimen Description   Final    BLOOD RIGHT ANTECUBITAL Performed at [facility] [address] [place], [place] [address]    Special Requests   Final    BOTTLES DRAWN AEROBIC AND ANAEROBIC Blood Culture adequate volume Performed at [facility] [address] [place], [place] [address]    Culture   Final    NO GROWTH < 24 HOURS Performed at [facility] [address] [place], [place] [address]    Report Status PENDING  Incomplete  Culture, blood (routine x 2) Call MD if unable to obtain prior to antibiotics being given     Status: None (Preliminary result)   Collection Time: 10/09/17  1:51 PM  Result Value Ref Range Status   Specimen Description   Final    BLOOD LEFT ANTECUBITAL Performed at [facility] [address], [place], [place] [address]    Special Requests   Final    BOTTLES DRAWN AEROBIC AND ANAEROBIC Blood Culture adequate volume Performed at [facility] [address] [place], [place] [address]    Culture   Final    NO GROWTH < 24 HOURS Performed at [facility] [address], [place], [place] [address]    Report Status PENDING  Incomplete  Culture, body fluid-bottle     Status: None (Preliminary result)   Collection Time: 10/09/17  5:28 PM  Result Value Ref Range Status   Specimen Description FLUID LEFT PLEURAL  Final   Special Requests BOTTLES DRAWN AEROBIC AND ANAEROBIC 10CC  Final   Culture   Final    NO GROWTH < 24 HOURS Performed at [facility], [place] [address] [place], [place] [address]    Report Status PENDING  Incomplete  Gram stain     Status: None   Collection Time: 10/09/17  5:28 PM  Result Value Ref Range Status   Specimen Description FLUID LEFT PLEURAL  Final   Special Requests NONE  Final   Gram Stain   Final    ABUNDANT WBC PRESENT, PREDOMINANTLY MONONUCLEAR NO ORGANISMS SEEN Performed at [facility], [place] [address] [place], [place] [address]    Report Status 10/09/2017 FINAL  Final  MRSA PCR Screening     Status: None   Collection Time: 10/10/17 10:31 AM  Result Value Ref Range Status   MRSA by PCR NEGATIVE NEGATIVE Final    Comment:        The GeneXpert MRSA Assay (FDA approved for NASAL specimens only), is one component of a comprehensive MRSA colonization surveillance program. It is not intended to diagnose MRSA infection nor to guide or monitor treatment for MRSA infections. Performed at [facility], [place] [address], [place],  [address]      Studies: Ct Chest Wo Contrast  Result Date: 10/10/2017 CLINICAL DATA:  Pneumonia; history acute on chronic combined systolic and diastolic CHF, COPD, coronary artery disease, hypertension, sarcoidosis, former smoker EXAM:  CT CHEST WITHOUT CONTRAST TECHNIQUE: Multidetector CT imaging of the chest was performed following the standard protocol without IV contrast. Sagittal and coronal MPR images reconstructed from axial data set. COMPARISON:  Chest radiograph 10/09/2017, CT chest 07/12/2016 FINDINGS: Cardiovascular: Atherosclerotic calcifications aorta, proximal great vessels, and coronary arteries. No pericardial effusion. 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 Mediastinum/Nodes: Numerous calcified mediastinal and hilar lymph nodes again identified consistent with history of sarcoidosis, unchanged. No additional thoracic adenopathy. Esophagus unremarkable. Base of cervical region normal appearance. Lungs/Pleura: LEFT pleural effusion. Consolidation and volume loss in LEFT lower lobe. Calcified pulmonary granulomata. Central peribronchial thickening. Hyperinflated/lucent LEFT upper lobe. Beaded appearance of airways in LEFT lower lobe consistent with bronchiectasis. Diffuse narrowing of the LEFT mainstem bronchus with occlusion of LEFT upper lobe and LEFT lower lobe bronchi at their origins versus previous exam. Large area of soft tissue opacity at the LEFT pulmonary hilum highly concerning for tumor, contiguous extending into mediastinum, associated with occlusion of the LEFT upper lobe and LEFT lower lobe bronchi and marked compression of the distal LEFT mainstem bronchus. Area of soft tissue opacity measures estimated 8.1 x 7.2 x 6.7 cm. Finding is concerning for malignancy invading the hilum and mediastinum. Upper Abdomen: Small cystic lesion with calcification at the RIGHT lobe of the liver 19 x 13 mm image 141. Duodenal diverticulum. Remaining visualized upper abdomen unremarkable. Musculoskeletal: Diffuse osseous demineralization. IMPRESSION: Chronic changes of sarcoidosis. Abnormal soft tissue opacity at the LEFT pulmonary hilum approximately 8.1 x 7.2 x 6.7 cm in size associated with  occlusion of the distal LEFT mainstem bronchus and proximal LEFT upper lobe/LEFT lower lobe bronchi highly suspicious for malignancy invading mediastinum; bronchoscopic evaluation recommended. Postobstructive pneumonitis involving the LEFT lower lobe with associated LEFT lower lobe bronchiectasis and new LEFT pleural effusion. Mild aneurysmal dilatation of the ascending thoracic aorta 4.2 cm greatest size, recommendation below. Recommend annual imaging followup by CTA or MRA. This recommendation follows 2010 ACCF/AHA/AATS/ACR/ASA/SCA/SCAI/SIR/STS/SVM Guidelines for the Diagnosis and Management of Patients with Thoracic Aortic Disease. Circulation. 2010; 121: A250-N397 Aortic Atherosclerosis (ICD10-I70.0). Electronically Signed   By: Lavonia Dana M.D.   On: 10/10/2017 17:19    Scheduled Meds: . amiodarone  200 mg Oral Daily  . apixaban  5 mg Oral BID  . atorvastatin  20 mg Oral q1800  . cholecalciferol  1,000 Units Oral Daily  . DULoxetine  30 mg Oral Daily  . finasteride  5 mg Oral Daily  . FLUoxetine  40 mg Oral Q breakfast  . furosemide  20 mg Oral BID  . guaiFENesin  1,200 mg Oral BID  . HYDROcodone-acetaminophen  1 tablet Oral Q6H  . ipratropium-albuterol  3 mL Nebulization TID  . methylPREDNISolone (SOLU-MEDROL) injection  40 mg Intravenous Q12H  . metoprolol tartrate  25 mg Oral BID  . mometasone-formoterol  2 puff Inhalation BID  . saccharomyces boulardii  250 mg Oral BID  . sacubitril-valsartan  1 tablet Oral BID  . sodium chloride HYPERTONIC  4 mL Nebulization TID    Continuous Infusions: . ceFEPime (MAXIPIME) IV Stopped (10/11/17 0606)     Time spent: 35 mins I have personally reviewed and interpreted on  10/11/2017 daily labs, tele strips, imagings as discussed above under date review session and assessment and plans.  I reviewed all nursing notes, pharmacy notes, consultant notes,  vitals, pertinent old records  I have discussed plan of care as described above with RN ,  patient  on 10/11/2017   Florencia Reasons MD, PhD  [facility] Pager [phone]. If 7PM-7AM, please contact night-coverage at [email or web address], password [facility] 10/11/2017, 8:52 AM  LOS: 2 days

## 2017-10-11 NOTE — Evaluation (Signed)
Physical Therapy Evaluation Patient Details Name: Travis Ferguson MRN: [id number] DOB: 1941-01-30 Today's Date: 10/11/2017   History of Present Illness   Travis Ferguson is a 77 y.o. male with medical history significant for nonischemic cardiomyopathy, OSA, COPD, pulmonary sarcoidosis, chronic pain syndrome, ambulatory dysfunction with physical debility, who presented with complaints of gradually worsening generalized weakness and dyspnea of 3 weeks duration.   Clinical Impression  Patient presents with decreased independence with mobility due to generalized weakness with recent fall at home.  Currently S level for mobility with cane and was independent with cane at home.  Feel patient could benefit from [place], but currently refusing.     Follow Up Recommendations Supervision for mobility/OOB;No PT follow up    Equipment Recommendations  None recommended by PT    Recommendations for Other Services       Precautions / Restrictions Precautions Precautions: Fall Precaution Comments: reports one fall last week, wife and grandaughter struggled to get him up Restrictions Weight Bearing Restrictions: No      Mobility  Bed Mobility Overal bed mobility: Modified Independent                Transfers Overall transfer level: Needs assistance Equipment used: Straight cane Transfers: Sit to/from Stand Sit to Stand: Supervision         General transfer comment: for safety  Ambulation/Gait Ambulation/Gait assistance: Supervision Ambulation Distance (Feet): 125 Feet Assistive device: Straight cane Gait Pattern/deviations: Step-through pattern;Decreased stride length;Trunk flexed     General Gait Details: good pace, but pt SOB on 2L O2 SpO2 89-90%  Stairs            Wheelchair Mobility    Modified Rankin (Stroke Patients Only)       Balance Overall balance assessment: Needs assistance   Sitting balance-Leahy Scale: Good       Standing balance-Leahy Scale:  Fair                               Pertinent Vitals/Pain Pain Assessment: 0-10 Pain Score: 7  Pain Location: L knee with ambulation, generalized at rest Pain Descriptors / Indicators: Discomfort;Sore;Aching Pain Intervention(s): Monitored during session;Repositioned    Home Living Family/patient expects to be discharged to:: Private residence Living Arrangements: Spouse/significant other Available Help at Discharge: Family;Available 24 hours/day Type of Home: House Home Access: Stairs to enter   [employer] of Steps: 1 Home Layout: One level Home Equipment: Walker - 2 wheels;Cane - single point;Bedside commode;Hand held shower head      Prior Function Level of Independence: Independent with assistive device(s)         Comments: uses SPC, limited mobility recently due to chronic pain, pneumonia, feeling weaker and one recent fall     Hand Dominance   Dominant Hand: Right    Extremity/Trunk Assessment   Upper Extremity Assessment Upper Extremity Assessment: LUE deficits/detail LUE Deficits / Details: history of "failed rotator cuff" lifts only about 80 degrees in abduction    Lower Extremity Assessment Lower Extremity Assessment: Generalized weakness       Communication   Communication: No difficulties  Cognition Arousal/Alertness: Awake/alert Behavior During Therapy: WFL for tasks assessed/performed Overall Cognitive Status: Within Functional Limits for tasks assessed                                 General Comments: anxious to discuss  plans with medical team due to pulmonary mass newly detected per pt report      General Comments General comments (skin integrity, edema, etc.): wife in room and had helped prior to session when pt standing to urinate    Exercises     Assessment/Plan    PT Assessment Patient needs continued PT services  PT Problem List Decreased strength;Decreased mobility;Decreased activity  tolerance;Decreased balance;Cardiopulmonary status limiting activity       PT Treatment Interventions DME instruction;Therapeutic activities;Gait training;Therapeutic exercise;Patient/family education;Balance training;Functional mobility training    PT Goals (Current goals can be found in the Care Plan section)  Acute Rehab PT Goals Patient Stated Goal: to go home PT Goal Formulation: With patient/family Time For Goal Achievement: 10/18/17 Potential to Achieve Goals: Good    Frequency Min 3X/week   Barriers to discharge        Co-evaluation               AM-PAC PT "6 Clicks" Daily Activity  Outcome Measure Difficulty turning over in bed (including adjusting bedclothes, sheets and blankets)?: A Little Difficulty moving from lying on back to sitting on the side of the bed? : A Little Difficulty sitting down on and standing up from a chair with arms (e.g., wheelchair, bedside commode, etc,.)?: A Little Help needed moving to and from a bed to chair (including a wheelchair)?: A Little Help needed walking in hospital room?: A Little Help needed climbing 3-5 steps with a railing? : A Little 6 Click Score: 18    End of Session Equipment Utilized During Treatment: Gait belt;Oxygen Activity Tolerance: Patient tolerated treatment well Patient left: in bed;with call bell/phone within reach;with family/visitor present   PT Visit Diagnosis: History of falling (Z91.81);Other abnormalities of gait and mobility (R26.89)    Time: 1225-1250 PT Time Calculation (min) (ACUTE ONLY): 25 min   Charges:   PT Evaluation $PT Eval Moderate Complexity: 1 Mod PT Treatments $Gait Training: 8-22 mins   PT G CodesMagda Kiel, [place] [phone] 10/11/2017   Reginia Naas 10/11/2017, 3:16 PM

## 2017-10-11 NOTE — Consult Note (Signed)
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  He denies orthopnea, proximal nocturnal dyspnea but reports a peculiar cough and worsening dyspnea in a sitting up position Chest x-ray showed large left effusion, WBC count was 14, he was treated with IV vancomycin and cefepime and underwent thoracentesis with removal of 1.2 L of fluid by radiology showing lymphocytic exudate. CT chest was subsequently Performed which showed left lower lobe mass invading left mainstem bronchus and mediastinum with postobstructive pneumonitis   He denies weight loss, fevers, productive sputum or chest pain.  He transiently developed A. fib RVR and has been seen by cardiology  I reviewed his primary pulmonologist notes in the record, all his prior CT scans and his previous biopsy from 2014  SIGNIFICANT EVENTS  thora 4/18 >> 1.2 L fluid >> lymphocytic exudate  STUDIES:  CT chest 4/19 >> Abnormal soft tissue opacity at the LEFT pulmonary hilum approximately 8.1 x 7.2 x 6.7 cm in size associated with occlusion of the distal LEFT mainstem bronchus and proximal LEFT upper lobe/LEFT lower lobe bronchi highly suspicious for malignancy invading mediastinum;   Postobstructive pneumonitis involving the LEFT lower lobe with associated LEFT lower lobe bronchiectasis and new LEFT pleural effusion.     PAST MEDICAL HISTORY :   has a past medical history of Acute on chronic combined systolic and diastolic CHF (congestive heart failure) ([place]) (02/24/2015), Anxiety, Arthritis, Atherosclerosis, CAP (community acquired pneumonia) (02/24/2015), Cervical disc  disease, Chronic lower back pain, Colon polyps (08/02/2014), COPD (chronic obstructive pulmonary disease) ([place]), Coronary artery calcification seen on CAT scan, Depression, Dyslipidemia, Dyspnea, GERD (gastroesophageal reflux disease), Hiatal hernia, History of blood transfusion ("numerous"), History of oxygen administration, Hypertension, Inguinal hernia, Migraine, OSA (obstructive sleep apnea), PTSD (post-traumatic stress disorder), Rhinitis, Sarcoidosis, Sensation problem, Sepsis ([place]), Steroid-induced hyperglycemia (02/25/2015), and Subdural hematoma ([place]) (11/10).  has a past surgical history that includes Video bronchoscopy (07/28/2012); Knee arthroscopy (Right); Lumbar laminectomy/decompression microdiscectomy (Right, 12/15/2012); Gallbladder surgery (09/2014); ERCP (N/A, 11/07/2014); Cholecystectomy (N/A, 11/08/2014); Cardiac catheterization (N/A, 01/23/2015); Reverse total shoulder arthroplasty (Left, 09/22/2015); Inguinal hernia repair (Left); Back surgery; Brain surgery (04/2009); and Reverse shoulder arthroplasty (Left, 09/22/2015). Prior to Admission medications   Medication Sig Start Date End Date Taking? Authorizing Provider  acetaminophen (TYLENOL) 650 MG CR tablet Take 650 mg by mouth daily as needed for pain.   Yes [provider]  albuterol (PROAIR HFA) 108 (90 Base) MCG/ACT inhaler 1-2 PUFFS EVERY 4-6 HOURS AS NEEDED for shortness of breath 08/08/17  Yes Chesley Mires, MD  amiodarone (PACERONE) 200 MG tablet Take 1 tablet (200 mg total) by mouth daily. 09/11/17  Yes Lorretta Harp, MD  atorvastatin (LIPITOR) 20 MG tablet Take 1 tablet (20 mg total) by mouth daily at 6 PM. 09/08/17  Yes Dessa Phi, DO  benzonatate (TESSALON) 200 MG capsule Take 1 capsule (200 mg total) by mouth 3 (three) times daily as needed for cough. 09/22/17  Yes Chesley Mires, MD  budesonide-formoterol (SYMBICORT) 160-4.5 MCG/ACT inhaler Inhale 2 puffs into the lungs 2 (two) times daily. 07/15/17  Yes Parrett, Tammy S,  NP  chlorhexidine (PERIDEX) 0.12 % solution chlorhexidine gluconate 0.12 % mouthwash   Yes [provider]  Cholecalciferol (VITAMIN D PO) Take 1 tablet by mouth daily.   Yes [provider]  DULoxetine (CYMBALTA) 30 MG capsule Take 30 mg by mouth daily.   Yes [provider]  ENTRESTO 24-26 MG TAKE 1 TABLET BY MOUTH 2 (TWO) TIMES DAILY. 05/14/16  Yes Lorretta Harp, MD  finasteride (PROSCAR) 5 MG tablet Take 5 mg by mouth daily.  01/07/13  Yes [provider]  FLUoxetine (PROZAC) 40 MG capsule Take 40 mg by mouth daily with breakfast.    Yes [provider]  furosemide (LASIX) 20 MG tablet Take 1 tablet (20 mg total) by mouth 2 (two) times daily. 09/08/17  Yes Dessa Phi, DO  HYDROcodone-acetaminophen (NORCO/VICODIN) 5-325 MG tablet Take 1 tablet by mouth every 8 (eight) hours as needed for moderate pain.    Yes [provider]  metoprolol tartrate (LOPRESSOR) 25 MG tablet TAKE 1 TABLET (25 MG TOTAL) BY MOUTH 2 (TWO) TIMES DAILY. 09/11/15  Yes Lorretta Harp, MD  OXYGEN Inhale 2 L/min into the lungs at bedtime.   Yes [provider]  sodium chloride (OCEAN) 0.65 % SOLN nasal spray Place 1-2 sprays into both nostrils daily as needed for congestion.   Yes [provider]  ipratropium (ATROVENT) 0.02 % nebulizer solution Take 2.5 mLs (0.5 mg total) by nebulization 4 (four) times daily. Patient not taking: Reported on 10/09/2017 07/15/17   Parrett, Fonnie Mu, NP  predniSONE (DELTASONE) 10 MG tablet Take 4 tabs for 3 days, then 3 tabs for 3 days, then 2 tabs for 3 days, then 1 tab for 3 days, then 1/2 tab for 4 days. Patient not taking: Reported on 10/09/2017 09/08/17   Dessa Phi, DO  SYMBICORT 160-4.5 MCG/ACT inhaler INHALE 2 PUFFS INTO THE LUNGS 2 (TWO) TIMES DAILY. Patient not taking: Reported on 10/09/2017 11/06/16   Chesley Mires, MD   Allergies  Allergen Reactions  . Statins Other (See Comments)    Muscle aches -  tolerating Vytorin    FAMILY HISTORY:  family history includes Colon polyps in his brother; Congestive Heart Failure in his brother; Coronary artery disease in his unknown relative; Heart disease in his father and mother; Stomach cancer in his paternal grandmother. SOCIAL HISTORY:  reports that he quit smoking about 19 years ago. His smoking use included pipe. He smoked 0.00 packs per day for 30.00 years. He has never used smokeless tobacco. He reports that he does not drink alcohol or use drugs.  REVIEW OF SYSTEMS:   Positive for nonproductive cough, and dyspnea as above He is hard of hearing Left shoulder pain and limited  Constitutional: Negative for fever, chills, weight loss, malaise/fatigue and diaphoresis.  HENT: Negative for hearing loss, ear pain, nosebleeds, congestion, sore throat, neck pain, tinnitus and ear discharge.   Eyes: Negative for blurred vision, double vision, photophobia, pain, discharge and redness.   Cardiovascular: Negative for chest pain, palpitations, orthopnea, claudication, leg swelling and PND.  Gastrointestinal: Negative for heartburn, nausea, vomiting, abdominal pain, diarrhea, constipation, blood in stool and melena.  Genitourinary: Negative for dysuria, urgency, frequency, hematuria and flank pain.  Musculoskeletal: Negative for myalgias, back pain, joint pain and falls.  Skin: Negative for itching and rash.  Neurological: Negative for dizziness, tingling, tremors, sensory change, speech change, focal weakness, seizures, loss of consciousness, weakness and headaches.  Endo/Heme/Allergies: Negative for environmental allergies and polydipsia. Does not bruise/bleed easily.  SUBJECTIVE:   VITAL SIGNS: Temp:  [97.5 F (36.4 C)-98 F (36.7 C)]  97.5 F (36.4 C) (04/20 0531) Pulse Rate:  [78-102] 80 (04/20 1300) Resp:  [18] 18 (04/20 1300) BP: (103-136)/(65-91) 127/86 (04/20 1300) SpO2:  [93 %-99 %] 99 % (04/20 1300) Weight:  [248 lb 14.4 oz (112.9 kg)]  248 lb 14.4 oz (112.9 kg) (04/20 0531)  PHYSICAL EXAMINATION: Gen. Pleasant, obese, in no distress, normal affect ENT - no pallor ,no post nasal drip, class 2-3 airway Neck: No JVD, no thyromegaly, no carotid bruits Lungs: no use of accessory muscles, left infrascapular dullness to percussion, decreased left Cardiovascular: Rhythm regular, heart sounds  normal, no murmurs or gallops, no peripheral edema Abdomen: soft and non-tender, no hepatosplenomegaly, BS normal. Musculoskeletal: No deformities, no cyanosis or clubbing, tremors left hand Neuro:  alert, non focal, no tremors   Recent Labs  Lab 10/09/17 1150 10/10/17 0549 10/11/17 0606  NA 137 134* 135  K 3.8 4.0 4.0  CL 99* 99* 101  CO2 24 23 22   BUN 15 15 19   CREATININE 1.07 0.91 0.99  GLUCOSE 143* 182* 185*   Recent Labs  Lab 10/09/17 1150 10/10/17 0549 10/11/17 0606  HGB 12.0* 11.9* 11.1*  HCT 36.1* 36.3* 34.6*  WBC 14.2* 9.3 12.4*  PLT 464* 435* 416*   Dg Chest 1 View  Result Date: 10/09/2017 CLINICAL DATA:  77 y/o  M; left thoracentesis. EXAM: CHEST  1 VIEW COMPARISON:  10/09/2017 chest radiograph FINDINGS: Stable cardiomegaly. Large left-sided pleural effusion with opacity of left mid and lower lung zones. A no appreciable pneumothorax. Clear right lung. Left shoulder reverse hemiarthroplasty is partially visualized. No acute osseous abnormality identified. IMPRESSION: Large left pleural effusion with opacification of left mid and lower lung zones. No appreciable pneumothorax. Electronically Signed   By: Kristine Garbe M.D.   On: 10/09/2017 18:13   Ct Chest Wo Contrast  Result Date: 10/10/2017 CLINICAL DATA:  Pneumonia; history acute on chronic combined systolic and diastolic CHF, COPD, coronary artery disease, hypertension, sarcoidosis, former smoker EXAM: CT CHEST WITHOUT CONTRAST TECHNIQUE: Multidetector CT imaging of the chest was performed following the standard protocol without IV contrast. Sagittal  and coronal MPR images reconstructed from axial data set. COMPARISON:  Chest radiograph 10/09/2017, CT chest 07/12/2016 FINDINGS: Cardiovascular: Atherosclerotic calcifications aorta, proximal great vessels, and coronary arteries. No pericardial effusion. 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 Mediastinum/Nodes: Numerous calcified mediastinal and hilar lymph nodes again identified consistent with history of sarcoidosis, unchanged. No additional thoracic adenopathy. Esophagus unremarkable. Base of cervical region normal appearance. Lungs/Pleura: LEFT pleural effusion. Consolidation and volume loss in LEFT lower lobe. Calcified pulmonary granulomata. Central peribronchial thickening. Hyperinflated/lucent LEFT upper lobe. Beaded appearance of airways in LEFT lower lobe consistent with bronchiectasis. Diffuse narrowing of the LEFT mainstem bronchus with occlusion of LEFT upper lobe and LEFT lower lobe bronchi at their origins versus previous exam. Large area of soft tissue opacity at the LEFT pulmonary hilum highly concerning for tumor, contiguous extending into mediastinum, associated with occlusion of the LEFT upper lobe and LEFT lower lobe bronchi and marked compression of the distal LEFT mainstem bronchus. Area of soft tissue opacity measures estimated 8.1 x 7.2 x 6.7 cm. Finding is concerning for malignancy invading the hilum and mediastinum. Upper Abdomen: Small cystic lesion with calcification at the RIGHT lobe of the liver 19 x 13 mm image 141. Duodenal diverticulum. Remaining visualized upper abdomen unremarkable. Musculoskeletal: Diffuse osseous demineralization. IMPRESSION: Chronic changes of sarcoidosis. Abnormal soft tissue opacity at the LEFT pulmonary hilum approximately 8.1 x  7.2 x 6.7 cm in size associated with occlusion of the distal LEFT mainstem bronchus and proximal LEFT upper lobe/LEFT lower lobe bronchi highly suspicious for malignancy invading mediastinum;  bronchoscopic evaluation recommended. Postobstructive pneumonitis involving the LEFT lower lobe with associated LEFT lower lobe bronchiectasis and new LEFT pleural effusion. Mild aneurysmal dilatation of the ascending thoracic aorta 4.2 cm greatest size, recommendation below. Recommend annual imaging followup by CTA or MRA. This recommendation follows 2010 ACCF/AHA/AATS/ACR/ASA/SCA/SCAI/SIR/STS/SVM Guidelines for the Diagnosis and Management of Patients with Thoracic Aortic Disease. Circulation. 2010; 121: Y706-C376 Aortic Atherosclerosis (ICD10-I70.0). Electronically Signed   By: Lavonia Dana M.D.   On: 10/10/2017 17:19   US Thoracentesis Asp Pleural Space W/img Guide  Result Date: 10/09/2017 INDICATION: Patient with history of sarcoidosis, coronary artery disease, COPD, CHF, pneumonia, dyspnea, left pleural effusion. Request made for diagnostic and therapeutic left thoracentesis. EXAM: ULTRASOUND GUIDED DIAGNOSTIC AND THERAPEUTIC LEFT THORACENTESIS MEDICATIONS: None COMPLICATIONS: None immediate. PROCEDURE: An ultrasound guided thoracentesis was thoroughly discussed with the patient and questions answered. The benefits, risks, alternatives and complications were also discussed. The patient understands and wishes to proceed with the procedure. Written consent was obtained. Ultrasound was performed to localize and mark an adequate pocket of fluid in the left chest. The area was then prepped and draped in the normal sterile fashion. 1% Lidocaine was used for local anesthesia. Under ultrasound guidance a 6 Fr Safe-T-Centesis catheter was introduced. Thoracentesis was performed. The catheter was removed and a dressing applied. FINDINGS: A total of approximately 1.2 liters of hazy, yellow fluid was removed. Samples were sent to the laboratory as requested by the clinical team. IMPRESSION: Successful ultrasound guided diagnostic and therapeutic left thoracentesis yielding 1.2 liters of pleural fluid. Read by: Rowe Robert, PA-C Electronically Signed   By: Suzy Bouchard M.D.   On: 10/09/2017 17:40    ASSESSMENT / PLAN:  Left lower lobe mass with endobronchial component. Left pleural effusion -lymphocytic exudate  I personally reviewed CT images and compared to 2018 and prior, left lower lobe mass is certainly increased in size from 4 cm to 8 cm and appears to invade the mediastinum and left main bronchus.  This is very suspicious for malignancy, likely lung primary. I would suggest that we wait for pleural fluid cytology If this is negative, then we can proceed with bronchoscopy and biopsy of endobronchial mass in the left mainstem to obtain diagnosis. He will likely need radiation oncology to see him as inpatient after diagnosis  made He will also need PET scan as outpatient  Postobstructive pneumonia-continue antibiotics for now  Reviewed images and discussed with patient and his wife in the room  Kara Mead MD. Shade Flood. [facility] Pager [phone] If no response call [phone]     10/11/2017, 3:14 PM

## 2017-10-11 NOTE — Progress Notes (Addendum)
Pharmacy Antibiotic Note  Travis Ferguson is a 77 y.o. male admitted on 10/09/2017 with HCAP.  Pharmacy has been consulted for vancomycin dosing. Patient also receiving IV cefepime. Patient received vancomycin 2000 mg IV loading dose in ED at approximately 1400 on 4/18.  Today, 10/11/2017:  WBC increasing on SoluMedrol  Remains afebrile  SCr stable  Switching to Unasyn today to over postobstructive PNA  Plan:  Unasyn 3g IV q6 hr  Pharmacy will sign off, following peripherally for renal adjustments and culture results   Height: 6' (182.9 cm) Weight: 248 lb 14.4 oz (112.9 kg) IBW/kg (Calculated) : 77.6  Temp (24hrs), Avg:97.7 F (36.5 C), Min:97.5 F (36.4 C), Max:98 F (36.7 C)  Recent Labs  Lab 10/09/17 1150 10/10/17 0549 10/11/17 0606  WBC 14.2* 9.3 12.4*  CREATININE 1.07 0.91 0.99  LATICACIDVEN  --   --  1.8    Estimated Creatinine Clearance: 81 mL/min (by C-G formula based on SCr of 0.99 mg/dL).    Allergies  Allergen Reactions  . Statins Other (See Comments)    Muscle aches - tolerating Vytorin    Antimicrobials this admission: 4/18>>vancomycin x 1 dose  4/18>>cefepime >> 4/20 4/20 >> Unasyn >>  Microbiology results: 4/18BCx: ngtd 4/18 Pleural fluid from L thoracentesis:gram stain neg, ngtd 4/19 MRSA PCR neg 4/19 strep pneumo neg  Thank you for allowing pharmacy to be a part of this patient's care.  Reuel Boom, PharmD, BCPS [phone] 10/11/2017, 2:04 PM

## 2017-10-12 DIAGNOSIS — I420 Dilated cardiomyopathy: Secondary | ICD-10-CM

## 2017-10-12 LAB — CBC WITH DIFFERENTIAL/PLATELET
Basophils Absolute: 0 10*3/uL (ref 0.0–0.1)
Basophils Relative: 0 %
EOS PCT: 0 %
Eosinophils Absolute: 0 10*3/uL (ref 0.0–0.7)
HCT: 35.2 % — ABNORMAL LOW (ref 39.0–52.0)
HEMOGLOBIN: 11.4 g/dL — AB (ref 13.0–17.0)
LYMPHS ABS: 1.1 10*3/uL (ref 0.7–4.0)
LYMPHS PCT: 7 %
MCH: 27.3 pg (ref 26.0–34.0)
MCHC: 32.4 g/dL (ref 30.0–36.0)
MCV: 84.2 fL (ref 78.0–100.0)
MONOS PCT: 8 %
Monocytes Absolute: 1.3 10*3/uL — ABNORMAL HIGH (ref 0.1–1.0)
Neutro Abs: 13.8 10*3/uL — ABNORMAL HIGH (ref 1.7–7.7)
Neutrophils Relative %: 85 %
Platelets: 489 10*3/uL — ABNORMAL HIGH (ref 150–400)
RBC: 4.18 MIL/uL — AB (ref 4.22–5.81)
RDW: 14.8 % (ref 11.5–15.5)
WBC: 16.1 10*3/uL — AB (ref 4.0–10.5)

## 2017-10-12 LAB — BASIC METABOLIC PANEL
Anion gap: 11 (ref 5–15)
BUN: 19 mg/dL (ref 6–20)
CHLORIDE: 101 mmol/L (ref 101–111)
CO2: 23 mmol/L (ref 22–32)
CREATININE: 0.96 mg/dL (ref 0.61–1.24)
Calcium: 8.8 mg/dL — ABNORMAL LOW (ref 8.9–10.3)
GFR calc Af Amer: >60 mL/min (ref >60)
GFR calc non Af Amer: >60 mL/min (ref >60)
GLUCOSE: 154 mg/dL — AB (ref 65–99)
Potassium: 3.7 mmol/L (ref 3.5–5.1)
Sodium: 135 mmol/L (ref 135–145)

## 2017-10-12 LAB — PROCALCITONIN: Procalcitonin: 0.19 ng/mL

## 2017-10-12 LAB — MAGNESIUM: Magnesium: 2 mg/dL (ref 1.7–2.4)

## 2017-10-12 MED ORDER — ACETAMINOPHEN 325 MG PO TABS
325.0000 mg | ORAL_TABLET | Freq: Four times a day (QID) | ORAL | Status: DC | PRN
  Administered 2017-10-12 (×2): 325 mg via ORAL
  Filled 2017-10-12 (×2): qty 1

## 2017-10-12 MED ORDER — PREDNISONE 20 MG PO TABS
20.0000 mg | ORAL_TABLET | Freq: Every day | ORAL | Status: DC
  Administered 2017-10-13: 20 mg via ORAL
  Filled 2017-10-12: qty 1

## 2017-10-12 MED ORDER — AMOXICILLIN-POT CLAVULANATE 875-125 MG PO TABS
1.0000 | ORAL_TABLET | Freq: Two times a day (BID) | ORAL | Status: DC
  Administered 2017-10-12 – 2017-10-13 (×3): 1 via ORAL
  Filled 2017-10-12 (×3): qty 1

## 2017-10-12 MED ORDER — POTASSIUM CHLORIDE CRYS ER 10 MEQ PO TBCR
40.0000 meq | EXTENDED_RELEASE_TABLET | Freq: Once | ORAL | Status: AC
  Administered 2017-10-12: 40 meq via ORAL
  Filled 2017-10-12: qty 4

## 2017-10-12 NOTE — Progress Notes (Signed)
PROGRESS NOTE  Travis Ferguson OAC:[id number] DOB: 1941-03-26 DOA: 10/09/2017 PCP: Crist Infante, MD  Brief Summary:  He was hospitalized from 3/12 to 3/18 and  treated for left lower lobe pneumonia.   He developed generalized weakness/falls at home and dyspnea , he was evaluated by primary care doctor and is sent back to the hospital on 4/18 from primary care doctor Dr. Silvestre Mesi office due to left-sided pleural effusion and WBC 16,000.  Chest x-ray with large left-sided pleural effusion.  Underwent thoracentesis with 1.2 L KCl of fluids removed  He also has history of paroxysmal A. fib heart failure cardiology consulted  H/o copd on home 02 2liters.  He has history of sarcoidosis he is not on any medication for this He is followed by Dr. Halford Chessman     HPI/Recap of past 24 hours:  Patient is very anxious the possible lung cancer diagnosis, he wants to know when cytology result will be back He denies chest pain, no fever, no edema, he has intermittent dry cough   I have personally reviewed tele strips it is sinus rhythm with frequent pvc's and NSVTx1 last 24hrs  Assessment/Plan: Active Problems:   Dyspnea   HCAP (healthcare-associated pneumonia)   S/P thoracentesis  Acute on chronic hypoxic respiratory failure, baseline on home O2 2 L at night and prn with activity -He presented with dyspnea weakness found to have left sided pleural effusion ( he has left sided pleural effusion since 08/2016),  -he Underwent thoracentesis this admission on 4/18 with 1.2 L KCl of fluids removed   Gram stain no organism,  Wbc elevated at 8126 with 77% lymps, 10% neurtophils fluids analysis  Has exudative features    Pleural Fluids total protein 3.5 ( serum total protein 6.7),    pleural fluids ldh 168 ( >2/3 of ldh upper limit 222), serum ldh was not obtained,   Fluids gram stain negative for organism, fluids culture no growth so far,  fluids cytology pending  - urine strep pneumon antigen  negative, serum procalcitonin less than 0.1. Blood culture no growth, he is afebrile, does not appear septic.   - chest CT "Abnormal soft tissue opacity at the LEFT pulmonary hilum approximately 8.1 x 7.2 x 6.7 cm in size associated with occlusion of the distal LEFT mainstem bronchus and proximal LEFT upper lobe/LEFT lower lobe bronchi highly suspicious for malignancy invading mediastinum; bronchoscopic evaluation recommended. Postobstructive pneumonitis involving the LEFT lower lobe with associated LEFT lower lobe bronchiectasis and new LEFT pleural effusion."  - he is started on vanc/cefepime for HCAP on admission, mrsa screening negative, d/c vanc d/ced on 4/19. D/c cefepime , change to unasyn on 4/20 then augmentin on 4/21  to cover postobstructive pna.  -I have consulted pulmonology Dr Elsworth Soho, per Dr Elsworth Soho will wait for pleural fluids cytology, hold apixaban for now in case needing bronchoscopy.   COPD;  There were documented wheezing on initial presentation, no wheezing during my exam for the last three days, he does has continued decreased breath sound on left side Taper steroids, change nebs to xopenex /atrovent bid  Continue mucinex/incentive spirometer  Pulmonary sarcoidosis  -Chronic changes of sarcoidosis on ct chest -not on meds for this chronically -Follows with Dr. Halford Chessman    PAF/NSVT, this could cause recent DOE as well Continue home meds lopressor/amiodarone.  -Cardiology consulted, he is started on apixaban. Now on hold in case of bronchoscopy Management per cardiology  Combined CHF, NICM with improved EF on entresto for more than 18yrs,  Non obstructive CAD per cath in 2016 CAD changes on CT chest as well. -entresto continued, bnp is not useful.  -continue home meds lopressor and lasix. -He does not have significant edema,  -Cardiology consulted, meds adjustment per cardiology.  Hypomagnesemia: replace mag  HLD; continue statin.   Depression/chronic pain: He is  continued on home meds prozac, cymbalta. Continue home med norco q6hrs  I have reviewed pmp aware, he is getting prescriptions from Dr Suella Broad  He received norco 90tabs for 30days supply  on 3/5, then 90tabs for 30days supply on 4/4 then 120 tab for 30day supply on 4/10.    Report generalized weakness and falls at home PT eval   OSA cpap qhs Body mass index is 33.97 kg/m.    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 Recommend annual imaging followup by CTA or MRA. This recommendation follows 2010 ACCF/AHA/AATS/ACR/ASA/SCA/SCAI/SIR/STS/SVM Guidelines for the Diagnosis and Management of Patients with Thoracic Aortic Disease. Circulation. 2010; 121: X937-J696     Code Status: partial code, yes to cpr, no intubation.  Family Communication: patient   Disposition Plan: pending cytology result, may need bronchoscopy, may need rad onc consult need pulmonology clearance   Consultants:   IR for thoracentesis  Cardiology  pulmonology  Procedures:  US guided thoracentesis on 4/18  Antibiotics:  vanc on 4/18  Cefepime from 4/18-4/19  unasyn from 4/19-4/21  augmentin from 4/21-   Objective: BP (!) 148/96 (BP Location: Left Arm)   Pulse 90   Temp 97.7 F (36.5 C) (Oral)   Resp 18   Ht 6' (1.829 m)   Wt 113.6 kg (250 lb 7.1 oz)   SpO2 91%   BMI 33.97 kg/m   Intake/Output Summary (Last 24 hours) at 10/12/2017 0830 Last data filed at 10/11/2017 2315 Gross per 24 hour  Intake 100 ml  Output 620 ml  Net -520 ml   Filed Weights   10/10/17 0609 10/11/17 0531 10/12/17 0500  Weight: 113.6 kg (250 lb 7.1 oz) 112.9 kg (248 lb 14.4 oz) 113.6 kg (250 lb 7.1 oz)    Exam: Patient is examined daily including today on 10/12/2017, exams remain the same as of yesterday except that has changed    General:  NAD  Cardiovascular: RRR  Respiratory: diminished left side, no  wheezing, no rales, no rhonchi  Abdomen: Soft/ND/NT, positive BS  Musculoskeletal: No Edema  Neuro: alert, oriented   Data Reviewed: Basic Metabolic Panel: Recent Labs  Lab 10/09/17 1150 10/10/17 0549 10/11/17 0606 10/12/17 0533  NA 137 134* 135 135  K 3.8 4.0 4.0 3.7  CL 99* 99* 101 101  CO2 24 23 22 23   GLUCOSE 143* 182* 185* 154*  BUN 15 15 19 19   CREATININE 1.07 0.91 0.99 0.96  CALCIUM 9.0 9.2 9.2 8.8*  MG  --   --  1.7 2.0   Liver Function Tests: Recent Labs  Lab 10/09/17 1150 10/10/17 0549  AST 14* 14*  ALT 12* 11*  ALKPHOS 74 67  BILITOT 0.8 1.2  PROT 6.8 6.7  ALBUMIN 2.8* 2.7*   No results for input(s): LIPASE, AMYLASE in the last 168 hours. No results for input(s): AMMONIA in the last 168 hours. CBC: Recent Labs  Lab 10/09/17 1150 10/10/17 0549 10/11/17 0606 10/12/17 0533  WBC 14.2* 9.3 12.4* 16.1*  NEUTROABS 12.3*  --  11.3* 13.8*  HGB 12.0* 11.9* 11.1* 11.4*  HCT 36.1* 36.3* 34.6* 35.2*  MCV 83.4 82.9 83.8 84.2  PLT 464* 435* 416* 489*   Cardiac Enzymes:   Recent Labs  Lab 10/09/17 1150  TROPONINI <0.03   BNP (last 3 results) Recent Labs    09/02/17 1315 10/09/17 1150  BNP 184.2* 106.1*    ProBNP (last 3 results) Recent Labs    08/08/17 0958  PROBNP 92.0    CBG: No results for input(s): GLUCAP in the last 168 hours.  Recent Results (from the past 240 hour(s))  Culture, blood (routine x 2) Call MD if unable to obtain prior to antibiotics being given     Status: None (Preliminary result)   Collection Time: 10/09/17  1:50 PM  Result Value Ref Range Status   Specimen Description   Final    BLOOD RIGHT ANTECUBITAL Performed at [facility] [address] [place], [place] [address]    Special Requests   Final    BOTTLES DRAWN AEROBIC AND ANAEROBIC Blood Culture adequate volume Performed at [facility] [address], [place], [place] [address]    Culture   Final    NO GROWTH 2  DAYS Performed at [facility] [address], [place], [place] [address]    Report Status PENDING  Incomplete  Culture, blood (routine x 2) Call MD if unable to obtain prior to antibiotics being given     Status: None (Preliminary result)   Collection Time: 10/09/17  1:51 PM  Result Value Ref Range Status   Specimen Description   Final    BLOOD LEFT ANTECUBITAL Performed at [facility] [address] [place], [place] [address]    Special Requests   Final    BOTTLES DRAWN AEROBIC AND ANAEROBIC Blood Culture adequate volume Performed at [facility] [address], [place], [place] [address]    Culture   Final    NO GROWTH 2 DAYS Performed at [facility] [address], [place], [place] [address]    Report Status PENDING  Incomplete  Culture, body fluid-bottle     Status: None (Preliminary result)   Collection Time: 10/09/17  5:28 PM  Result Value Ref Range Status   Specimen Description FLUID LEFT PLEURAL  Final   Special Requests BOTTLES DRAWN AEROBIC AND ANAEROBIC 10CC  Final   Culture   Final    NO GROWTH 2 DAYS Performed at [facility], [place] [address], [place], [place] [address]    Report Status PENDING  Incomplete  Gram stain     Status: None   Collection Time: 10/09/17  5:28 PM  Result Value Ref Range Status   Specimen Description FLUID LEFT PLEURAL  Final   Special Requests NONE  Final   Gram Stain   Final    ABUNDANT WBC PRESENT, PREDOMINANTLY MONONUCLEAR NO ORGANISMS SEEN Performed at [facility], [place] [address], [place], [place] [address]    Report Status 10/09/2017 FINAL  Final  MRSA PCR Screening     Status: None   Collection Time: 10/10/17 10:31 AM  Result Value Ref Range Status   MRSA by PCR NEGATIVE NEGATIVE Final    Comment:        The GeneXpert MRSA Assay (FDA approved for NASAL specimens only), is one component of a comprehensive MRSA colonization surveillance program. It is  not intended to diagnose MRSA infection nor to guide or monitor treatment for MRSA infections. Performed at [facility], [address] [place], [place] [address]      Studies: No results found.  Scheduled Meds: . amiodarone  200 mg Oral Daily  . atorvastatin  20 mg Oral q1800  . cholecalciferol  1,000 Units Oral Daily  . DULoxetine  30 mg Oral Daily  . finasteride  5 mg Oral Daily  . FLUoxetine  40 mg Oral Q breakfast  . furosemide  20 mg Oral BID  . guaiFENesin  1,200 mg Oral BID  . HYDROcodone-acetaminophen  1 tablet Oral Q6H  . ipratropium  0.5 mg Nebulization BID  . levalbuterol  1.25 mg Nebulization BID  . metoprolol tartrate  25 mg Oral BID  . mometasone-formoterol  2 puff Inhalation BID  . potassium chloride  40 mEq Oral Once  . predniSONE  40 mg Oral Q breakfast  . saccharomyces boulardii  250 mg Oral BID  . sacubitril-valsartan  1 tablet Oral BID    Continuous Infusions: . ampicillin-sulbactam (UNASYN) IV Stopped (10/12/17 0550)     Time spent: 35 mins, more than 50% time spent on patient education,  I have personally reviewed and interpreted on  10/12/2017 daily labs, tele strips, imagings as discussed above under date review session and assessment and plans.  I reviewed all nursing notes, pharmacy notes, consultant notes,  vitals, pertinent old records  I have discussed plan of care as described above with RN , patient  on 10/12/2017   Florencia Reasons MD, PhD  [facility] Pager [phone]. If 7PM-7AM, please contact night-coverage at [email or web address], password [facility] 10/12/2017, 8:30 AM  LOS: 3 days

## 2017-10-12 NOTE — Progress Notes (Signed)
Subjective:  Sats better concerned about lung cancer   Objective:  Vitals:   10/11/17 2014 10/11/17 2118 10/12/17 0500 10/12/17 0630  BP:  (!) 139/92  (!) 148/96  Pulse:  92  90  Resp:  18  18  Temp:  97.8 F (36.6 C)  97.7 F (36.5 C)  TempSrc:  Oral  Oral  SpO2: 96% 92%  91%  Weight:   250 lb 7.1 oz (113.6 kg)   Height:        Intake/Output from previous day:  Intake/Output Summary (Last 24 hours) at 10/12/2017 0820 Last data filed at 10/11/2017 2315 Gross per 24 hour  Intake 100 ml  Output 620 ml  Net -520 ml    Physical Exam: Affect appropriate Chronically ill white male  HEENT: normal Neck supple with no adenopathy JVP normal no bruits no thyromegaly Lungs rhonchi and  wheezing and good diaphragmatic motion Heart:  S1/S2 no murmur, no rub, gallop or click PMI normal Abdomen: benighn, BS positve, no tenderness, no AAA no bruit.  No HSM or HJR Distal pulses intact with no bruits No edema Neuro non-focal Skin warm and dry No muscular weakness   Lab Results: Basic Metabolic Panel: Recent Labs    10/11/17 0606 10/12/17 0533  NA 135 135  K 4.0 3.7  CL 101 101  CO2 22 23  GLUCOSE 185* 154*  BUN 19 19  CREATININE 0.99 0.96  CALCIUM 9.2 8.8*  MG 1.7 2.0   Liver Function Tests: Recent Labs    10/09/17 1150 10/10/17 0549  AST 14* 14*  ALT 12* 11*  ALKPHOS 74 67  BILITOT 0.8 1.2  PROT 6.8 6.7  ALBUMIN 2.8* 2.7*   No results for input(s): LIPASE, AMYLASE in the last 72 hours. CBC: Recent Labs    10/11/17 0606 10/12/17 0533  WBC 12.4* 16.1*  NEUTROABS 11.3* 13.8*  HGB 11.1* 11.4*  HCT 34.6* 35.2*  MCV 83.8 84.2  PLT 416* 489*   Cardiac Enzymes: Recent Labs    10/09/17 1150  TROPONINI <0.03    Imaging: Ct Chest Wo Contrast  Result Date: 10/10/2017 CLINICAL DATA:  Pneumonia; history acute on chronic combined systolic and diastolic CHF, COPD, coronary artery disease, hypertension, sarcoidosis, former smoker EXAM: CT CHEST  WITHOUT CONTRAST TECHNIQUE: Multidetector CT imaging of the chest was performed following the standard protocol without IV contrast. Sagittal and coronal MPR images reconstructed from axial data set. COMPARISON:  Chest radiograph 10/09/2017, CT chest 07/12/2016 FINDINGS: Cardiovascular: Atherosclerotic calcifications aorta, proximal great vessels, and coronary arteries. No pericardial effusion. 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 Mediastinum/Nodes: Numerous calcified mediastinal and hilar lymph nodes again identified consistent with history of sarcoidosis, unchanged. No additional thoracic adenopathy. Esophagus unremarkable. Base of cervical region normal appearance. Lungs/Pleura: LEFT pleural effusion. Consolidation and volume loss in LEFT lower lobe. Calcified pulmonary granulomata. Central peribronchial thickening. Hyperinflated/lucent LEFT upper lobe. Beaded appearance of airways in LEFT lower lobe consistent with bronchiectasis. Diffuse narrowing of the LEFT mainstem bronchus with occlusion of LEFT upper lobe and LEFT lower lobe bronchi at their origins versus previous exam. Large area of soft tissue opacity at the LEFT pulmonary hilum highly concerning for tumor, contiguous extending into mediastinum, associated with occlusion of the LEFT upper lobe and LEFT lower lobe bronchi and marked compression of the distal LEFT mainstem bronchus. Area of soft tissue opacity measures estimated 8.1 x 7.2 x 6.7 cm. Finding is concerning for malignancy invading the hilum and  mediastinum. Upper Abdomen: Small cystic lesion with calcification at the RIGHT lobe of the liver 19 x 13 mm image 141. Duodenal diverticulum. Remaining visualized upper abdomen unremarkable. Musculoskeletal: Diffuse osseous demineralization. IMPRESSION: Chronic changes of sarcoidosis. Abnormal soft tissue opacity at the LEFT pulmonary hilum approximately 8.1 x 7.2 x 6.7 cm in size associated with occlusion of  the distal LEFT mainstem bronchus and proximal LEFT upper lobe/LEFT lower lobe bronchi highly suspicious for malignancy invading mediastinum; bronchoscopic evaluation recommended. Postobstructive pneumonitis involving the LEFT lower lobe with associated LEFT lower lobe bronchiectasis and new LEFT pleural effusion. Mild aneurysmal dilatation of the ascending thoracic aorta 4.2 cm greatest size, recommendation below. Recommend annual imaging followup by CTA or MRA. This recommendation follows 2010 ACCF/AHA/AATS/ACR/ASA/SCA/SCAI/SIR/STS/SVM Guidelines for the Diagnosis and Management of Patients with Thoracic Aortic Disease. Circulation. 2010; 121: S568-L275 Aortic Atherosclerosis (ICD10-I70.0). Electronically Signed   By: Lavonia Dana M.D.   On: 10/10/2017 17:19    Cardiac Studies:  ECG: NSR rate 98 nonspecific ST changes    Telemetry:  NSR 10/12/2017   Echo: EF 50-55% estimated PA 47 mmHg  Medications:   . amiodarone  200 mg Oral Daily  . atorvastatin  20 mg Oral q1800  . cholecalciferol  1,000 Units Oral Daily  . DULoxetine  30 mg Oral Daily  . finasteride  5 mg Oral Daily  . FLUoxetine  40 mg Oral Q breakfast  . furosemide  20 mg Oral BID  . guaiFENesin  1,200 mg Oral BID  . HYDROcodone-acetaminophen  1 tablet Oral Q6H  . ipratropium  0.5 mg Nebulization BID  . levalbuterol  1.25 mg Nebulization BID  . metoprolol tartrate  25 mg Oral BID  . mometasone-formoterol  2 puff Inhalation BID  . predniSONE  40 mg Oral Q breakfast  . saccharomyces boulardii  250 mg Oral BID  . sacubitril-valsartan  1 tablet Oral BID     . ampicillin-sulbactam (UNASYN) IV Stopped (10/12/17 0550)    Assessment/Plan:   DCM:  Non ischemic EF improved with combination of beta blocker and entresto continue BNP low   Left Effusion: post thoracentesis BNP only 106 ? Related to primary lung process continue lasix lymphcytic exudate with LLL mass and endobronchial component lung cancer   PAF:  Maintaining NSR on  eliquis Amiodarone per JB despite chronic lung issues consider changing To multaq in future  Pulmonary:  Primary issue COPD Pneumonia 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 Continue maxipime nebs and prednisone Looks like new diagnosis of advanced stage Lung cancer    Jenkins Rouge 10/12/2017, 8:20 AM

## 2017-10-13 ENCOUNTER — Ambulatory Visit: Payer: Medicare Other | Admitting: Pulmonary Disease

## 2017-10-13 ENCOUNTER — Telehealth: Payer: Self-pay | Admitting: Cardiology

## 2017-10-13 DIAGNOSIS — I5032 Chronic diastolic (congestive) heart failure: Secondary | ICD-10-CM

## 2017-10-13 DIAGNOSIS — I480 Paroxysmal atrial fibrillation: Secondary | ICD-10-CM

## 2017-10-13 DIAGNOSIS — D860 Sarcoidosis of lung: Secondary | ICD-10-CM

## 2017-10-13 DIAGNOSIS — I420 Dilated cardiomyopathy: Secondary | ICD-10-CM

## 2017-10-13 DIAGNOSIS — I5022 Chronic systolic (congestive) heart failure: Secondary | ICD-10-CM

## 2017-10-13 DIAGNOSIS — R0902 Hypoxemia: Secondary | ICD-10-CM

## 2017-10-13 DIAGNOSIS — R918 Other nonspecific abnormal finding of lung field: Secondary | ICD-10-CM

## 2017-10-13 LAB — CBC
HCT: 36.1 % — ABNORMAL LOW (ref 39.0–52.0)
Hemoglobin: 11.8 g/dL — ABNORMAL LOW (ref 13.0–17.0)
MCH: 27.9 pg (ref 26.0–34.0)
MCHC: 32.7 g/dL (ref 30.0–36.0)
MCV: 85.3 fL (ref 78.0–100.0)
PLATELETS: 397 10*3/uL (ref 150–400)
RBC: 4.23 MIL/uL (ref 4.22–5.81)
RDW: 14.8 % (ref 11.5–15.5)
WBC: 13.1 10*3/uL — ABNORMAL HIGH (ref 4.0–10.5)

## 2017-10-13 LAB — BASIC METABOLIC PANEL
ANION GAP: 10 (ref 5–15)
BUN: 21 mg/dL — AB (ref 6–20)
CALCIUM: 8.8 mg/dL — AB (ref 8.9–10.3)
CO2: 26 mmol/L (ref 22–32)
CREATININE: 0.91 mg/dL (ref 0.61–1.24)
Chloride: 101 mmol/L (ref 101–111)
GFR calc Af Amer: >60 mL/min (ref >60)
GFR calc non Af Amer: >60 mL/min (ref >60)
GLUCOSE: 129 mg/dL — AB (ref 65–99)
Potassium: 3.8 mmol/L (ref 3.5–5.1)
Sodium: 137 mmol/L (ref 135–145)

## 2017-10-13 LAB — LACTIC ACID, PLASMA: Lactic Acid, Venous: 1.5 mmol/L (ref 0.5–1.9)

## 2017-10-13 MED ORDER — FINASTERIDE 5 MG PO TABS: 5.0000 mg | ORAL_TABLET | Freq: Every day | ORAL | Status: DC

## 2017-10-13 MED ORDER — SACCHAROMYCES BOULARDII 250 MG PO CAPS: 250.0000 mg | ORAL_CAPSULE | Freq: Two times a day (BID) | ORAL | 0 refills | Status: DC

## 2017-10-13 MED ORDER — APIXABAN 5 MG PO TABS: 5.0000 mg | ORAL_TABLET | Freq: Two times a day (BID) | ORAL | 0 refills | Status: DC

## 2017-10-13 MED ORDER — DULOXETINE HCL 30 MG PO CPEP: 30.0000 mg | ORAL_CAPSULE | Freq: Every day | ORAL | Status: DC

## 2017-10-13 MED ORDER — AMOXICILLIN-POT CLAVULANATE 875-125 MG PO TABS: 1.0000 | ORAL_TABLET | Freq: Two times a day (BID) | ORAL | 0 refills | Status: AC

## 2017-10-13 MED ORDER — LEVALBUTEROL HCL 1.25 MG/0.5ML IN NEBU: 1.2500 mg | INHALATION_SOLUTION | Freq: Three times a day (TID) | RESPIRATORY_TRACT | 0 refills | Status: AC | PRN

## 2017-10-13 MED ORDER — PREDNISONE 10 MG PO TABS: 10.0000 mg | ORAL_TABLET | Freq: Every day | ORAL | 0 refills | Status: AC

## 2017-10-13 MED ORDER — GUAIFENESIN ER 600 MG PO TB12: 600.0000 mg | ORAL_TABLET | Freq: Two times a day (BID) | ORAL | 2 refills | Status: AC

## 2017-10-13 MED ORDER — VITAMIN D3 25 MCG (1000 UNIT) PO TABS: 1000.0000 [IU] | ORAL_TABLET | Freq: Every day | ORAL | Status: DC

## 2017-10-13 NOTE — Progress Notes (Signed)
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  He denies orthopnea, proximal nocturnal dyspnea but reports a peculiar cough and worsening dyspnea in a sitting up position Chest x-ray showed large left effusion, WBC count was 14, he was treated with IV vancomycin and cefepime and underwent thoracentesis with removal of 1.2 L of fluid by radiology showing lymphocytic exudate. CT chest was subsequently Performed which showed left lower lobe mass invading left mainstem bronchus and mediastinum with postobstructive pneumonitis   He denies weight loss, fevers, productive sputum or chest pain.  He transiently developed A. fib RVR and has been seen by cardiology  I reviewed his primary pulmonologist notes in the record, all his prior CT scans and his previous biopsy from 2014  SIGNIFICANT EVENTS  thora 4/18 >> 1.2 L fluid >> lymphocytic exudate  STUDIES:  CT chest 4/19 >> Abnormal soft tissue opacity at the LEFT pulmonary hilum approximately 8.1 x 7.2 x 6.7 cm in size associated with occlusion of the distal LEFT mainstem bronchus and proximal LEFT upper lobe/LEFT lower lobe bronchi highly suspicious for malignancy invading mediastinum;   Postobstructive pneumonitis involving the LEFT lower lobe with associated LEFT lower lobe bronchiectasis and new LEFT pleural effusion.  SUBJECTIVE:  No events overnight, no new complaints  VITAL SIGNS: Temp:  [97.6 F (36.4 C)-98.5 F (36.9 C)] 97.6 F (36.4 C) (04/22 0450) Pulse Rate:  [79-88] 79 (04/22 0450) Resp:  [20] 20 (04/22 0450) BP: (125-139)/(72-91) 125/72 (04/22 0450) SpO2:   [95 %-99 %] 95 % (04/22 0828) Weight:  [248 lb 14.4 oz (112.9 kg)] 248 lb 14.4 oz (112.9 kg) (04/22 0449)  PHYSICAL EXAMINATION: Gen. Pleasant, obese, in no distress, normal affect ENT - No nasal drip Neck: No JVD, no thyromegaly, no carotid bruits Lungs: CTA bilaterally Cardiovascular: RRR, Nl S1/S2 and -M/R/G Abdomen: Soft, NT, ND and +BS Musculoskeletal: No deformities, no cyanosis or clubbing, tremors left hand Neuro:  alert, non focal, no tremors, moving all ext to command   Recent Labs  Lab 10/11/17 0606 10/12/17 0533 10/13/17 0605  NA 135 135 137  K 4.0 3.7 3.8  CL 101 101 101  CO2 22 23 26   BUN 19 19 21*  CREATININE 0.99 0.96 0.91  GLUCOSE 185* 154* 129*   Recent Labs  Lab 10/11/17 0606 10/12/17 0533 10/13/17 0603  HGB 11.1* 11.4* 11.8*  HCT 34.6* 35.2* 36.1*  WBC 12.4* 16.1* 13.1*  PLT 416* 489* 397   I reviewed chest CT myself, left sided pleural effusion and lung mass noted.  Discussed with PCCM-NP and Dr. Halford Chessman  ASSESSMENT / PLAN:  77 year old male with PMH above presenting with SOB and cough, noted to have a lung mass and pleural effusion.  Hypoxemia:  - Titrate O2 for sat of 88-92%  - May need and ambulatory desat study to see O2 demand with mobility  Pleural effusion: exudative by protein and LDH, likely cancer related but cytology is negative.   - If recurs then will consider another tap which patient is refusing right now.  Lung mass:  - Patient would rather go home and have f/u with Dr. Halford Chessman as outpatient and arrange the  bronchoscopy that way.  He is aware that we are very concerned that this could be cancer but would rather go home then f/u as outpatient.  - F/U with Dr. Halford Chessman on 5/3 at 1:30 PM and will arrange for bronch as outpatient  PCCM will sign off, please call back if needed.  Rush Farmer, M.D. [facility] Pulmonary/Critical Care Medicine. Pager: [phone]. After hours pager: [phone].  10/13/2017, 3:14 PM

## 2017-10-13 NOTE — Telephone Encounter (Signed)
TOC Patient-Please call Patient-Pt has an appointment on 10-22-17.

## 2017-10-13 NOTE — Discharge Summary (Signed)
Discharge Summary  Travis Ferguson WNI:[id number] DOB: 01/25/1941  PCP: Crist Infante, MD  Admit date: 10/09/2017 Discharge date: 10/13/2017  Time spent: 72mins, more than 50% time spent on coordination of care.  Recommendations for Outpatient Follow-up:  1. F/u with PMD within a week  for hospital discharge follow up, repeat cbc/bmp at follow up. 2. F/u with pulmonology Dr Halford Chessman for recurrent left sided pleural effusion and to discuss bronchoscopy for left sided lung mass. Dr Halford Chessman to instruct patient regarding holding epixaban for procedure. 3. F/u with cardiology Dr Gwenlyn Found for PAF.  Discharge Diagnoses:  Active Hospital Problems   Diagnosis Date Noted  . PAF (paroxysmal atrial fibrillation) ([place])   . DCM (dilated cardiomyopathy) ([place])   . Hypoxemia   . Lung mass   . HCAP (healthcare-associated pneumonia) 10/09/2017  . S/P thoracentesis   . Dyspnea     Resolved Hospital Problems  No resolved problems to display.    Discharge Condition: stable  Diet recommendation: heart healthy  Filed Weights   10/11/17 0531 10/12/17 0500 10/13/17 0449  Weight: 112.9 kg (248 lb 14.4 oz) 113.6 kg (250 lb 7.1 oz) 112.9 kg (248 lb 14.4 oz)    History of present illness: (per admitting MD Dr Nevada Crane)  PCP: Crist Infante, MD  Outpatient Specialists:  Patient coming from: Home  Chief Complaint: Generalized weakness and dyspnea.  HPI: Travis Ferguson is a 77 y.o. male with medical history significant for nonischemic cardiomyopathy, OSA, COPD, pulmonary sarcoidosis, chronic pain syndrome, ambulatory dysfunction with physical debility, who presented to [facility] ED with complaints of gradually worsening generalized weakness and dyspnea of 3 weeks duration.  Associated with a nonproductive cough.  Also reports frequent falls.  Last fall was 1 week ago.  He denies any chest pain or palpitations.    ED Course: Chest x-ray done in the ED personally reviewed revealed large left pleural effusion with left  infiltrates with suspicion for pneumonia.  Leukocytosis with white count 14 with left shift.  Started on IV vancomycin and IV cefepime in the ED.   Hospital Course:  Active Problems:   Dyspnea   HCAP (healthcare-associated pneumonia)   S/P thoracentesis   PAF (paroxysmal atrial fibrillation) ([facility])   DCM (dilated cardiomyopathy) ([facility])   Hypoxemia   Lung mass  Acute on chronic hypoxic respiratory failure, baseline on home O2 2 L at night and prn with activity -He presented with dyspnea weakness found to have left sided pleural effusion ( he has left sided pleural effusion since 08/2016),  -he Underwent thoracentesis this admission on 4/18 with 1.2 L KCl of fluids removed   Gram stain no organism,  Wbc elevated at 8126 with 77% lymps, 10% neurtophils fluids analysis  Has exudative features    Pleural Fluids total protein 3.5 ( serum total protein 6.7),    pleural fluids ldh 168 ( >2/3 of ldh upper limit 222), serum ldh was not obtained,   Fluids gram stain negative for organism, fluids culture no growth. fluids cytology "REACTIVE MESOTHELIAL CELLS PRESENT. ACUTE INFLAMMATION"  no malignant cells on cytology  - urine strep pneumon antigen negative, serum procalcitonin less than 0.1. Blood culture no growth, he is afebrile, does not appear septic.   - chest CT "Abnormal soft tissue opacity at the LEFT pulmonary hilum approximately 8.1 x 7.2 x 6.7 cm in size associated with occlusion of the distal LEFT mainstem bronchus and proximal LEFT upper lobe/LEFT lower lobe bronchi highly suspicious for malignancy invading mediastinum; bronchoscopic evaluation recommended.  Postobstructive pneumonitis involving the LEFT lower lobe with associated LEFT lower lobe bronchiectasis and new LEFT pleural effusion."  - he is started on vanc/cefepime for HCAP on admission, mrsa screening negative, d/c vanc d/ced on 4/19. D/c cefepime , change to unasyn on 4/20 then augmentin on 4/21  to cover  postobstructive pna. He is discharged on augmentin  -patient feels better, he elected to go home and follow up with pulmonology Dr Halford Chessman to discuss bronchoscopy . He has an appointment with Dr Halford Chessman on 5/3. Patient is advised to  Coordinate with Dr Halford Chessman regarding holding [place] if he were to proceed with bronchoscopy.    COPD;  There were documented wheezing on initial presentation, no wheezing during my exam for the last three days, he does has continued decreased breath sound on left side Taper off steroids,  change prn nebs to xopenex /atrovent  Continue mucinex/incentive spirometer  Pulmonary sarcoidosis  -Chronic changes of sarcoidosis on ct chest -not on meds for this chronically -Follows with Dr. Halford Chessman    PAF/NSVT, this could cause recent DOE as well -he was in afib/rvr initially this hospitalization -Continue home meds lopressor/amiodarone.  -Cardiology consulted, he is started on apixaban. he understand to hold apixaban if he were to proceed with bronchoscopy - he is in sinus rhythm on day of discharge. He is cleared to d/c home by cardiology and follow up with cardiology Dr Gwenlyn Found in the office.  Combined CHF, NICM with improved EF on entresto for more than 63yrs, Non obstructive CAD per cath in 2016 -CAD changes on CT chest as well. -entresto continued, bnp is not useful.  -continue home meds lopressor and lasix. -he is euvolemic at time of discharge.  Hypomagnesemia: mag replaced and normalized.  HLD; continue statin.   Depression/chronic pain: He is continued on home meds prozac, cymbalta. Continue home med norco q6hrs  I have reviewed pmp aware, he is getting prescriptions from Dr Suella Broad  He received norco 90tabs for 30days supply  on 3/5, then 90tabs for 30days supply on 4/4 then 120 tab for 30day supply on 4/10.    Report generalized weakness and falls at home He did well with PT eval, PT did not recommend home health   OSA cpap  qhs Body mass index is 33.97 kg/m.    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 Recommend annual imaging followup by CTA or MRA. This recommendation follows 2010 ACCF/AHA/AATS/ACR/ASA/SCA/SCAI/SIR/STS/SVM Guidelines for the Diagnosis and Management of Patients with Thoracic Aortic Disease. Circulation. 2010; 121: G818-H631     Code Status: partial code, yes to cpr, no intubation.  Family Communication: patient and wife at bedside  Disposition Plan: home, follow up with Dr Halford Chessman to discuss bronchoscopy   Consultants:   IR for thoracentesis  Cardiology  pulmonology  Procedures:  US guided thoracentesis on 4/18  Antibiotics:  vanc on 4/18  Cefepime from 4/18-4/19  unasyn from 4/19-4/21  augmentin from 4/21-    Discharge Exam: BP 103/74 (BP Location: Right Arm)   Pulse 86   Temp 98 F (36.7 C) (Oral)   Resp 20   Ht 6' (1.829 m)   Wt 112.9 kg (248 lb 14.4 oz)   SpO2 97%   BMI 33.76 kg/m    General:  NAD  Cardiovascular: RRR  Respiratory: diminished left side, no wheezing, no rales, no rhonchi  Abdomen: Soft/ND/NT, positive BS  Musculoskeletal: No Edema  Neuro: alert, oriented  Discharge Instructions You were cared for by a hospitalist during your hospital stay. If you have any questions about your discharge medications or the care you received while you were in the hospital after you are discharged, you can call the unit and asked to speak with the hospitalist on call if the hospitalist that took care of you is not available. Once you are discharged, your primary care physician will handle any further medical issues. Please note that NO REFILLS for any discharge medications will be authorized once you are discharged, as it is imperative that you return to your primary care physician (or establish a relationship with a primary care physician if  you do not have one) for your aftercare needs so that they can reassess your need for medications and monitor your lab values.  Discharge Instructions    Diet - low sodium heart healthy   Complete by:  As directed    Increase activity slowly   Complete by:  As directed      Allergies as of 10/13/2017      Reactions   Statins Other (See Comments)   Muscle aches - tolerating Vytorin      Medication List    STOP taking these medications   albuterol 108 (90 Base) MCG/ACT inhaler Commonly known as:  PROAIR HFA     TAKE these medications   acetaminophen 650 MG CR tablet Commonly known as:  TYLENOL Take 650 mg by mouth daily as needed for pain.   amiodarone 200 MG tablet Commonly known as:  PACERONE Take 1 tablet (200 mg total) by mouth daily.   amoxicillin-clavulanate 875-125 MG tablet Commonly known as:  AUGMENTIN Take 1 tablet by mouth every 12 (twelve) hours for 6 days.   apixaban 5 MG Tabs tablet Commonly known as:  ELIQUIS Take 1 tablet (5 mg total) by mouth 2 (two) times daily.   atorvastatin 20 MG tablet Commonly known as:  LIPITOR Take 1 tablet (20 mg total) by mouth daily at 6 PM.   benzonatate 200 MG capsule Commonly known as:  TESSALON Take 1 capsule (200 mg total) by mouth 3 (three) times daily as needed for cough.   budesonide-formoterol 160-4.5 MCG/ACT inhaler Commonly known as:  SYMBICORT Inhale 2 puffs into the lungs 2 (two) times daily. What changed:  Another medication with the same name was removed. Continue taking this medication, and follow the directions you see here.   chlorhexidine 0.12 % solution Commonly known as:  PERIDEX chlorhexidine gluconate 0.12 % mouthwash   DULoxetine 30 MG capsule Commonly known as:  CYMBALTA Take 30 mg by mouth daily.   ENTRESTO 24-26 MG Generic drug:  sacubitril-valsartan TAKE 1 TABLET BY MOUTH 2 (TWO) TIMES DAILY.   finasteride 5 MG tablet Commonly known as:  PROSCAR Take 5 mg by mouth daily.     FLUoxetine 40 MG capsule Commonly known as:  PROZAC Take 40 mg by mouth daily with breakfast.   furosemide 20 MG tablet Commonly known as:  LASIX Take 1 tablet (20 mg total) by mouth 2 (two) times daily.   guaiFENesin 600 MG 12 hr tablet Commonly known as:  MUCINEX Take 1 tablet (600 mg total) by mouth 2 (two) times daily.   HYDROcodone-acetaminophen 5-325 MG tablet Commonly known as:  NORCO/VICODIN Take 1 tablet by mouth every 8 (eight) hours as needed for moderate pain.   ipratropium 0.02 % nebulizer solution Commonly known as:  ATROVENT Take 2.5 mLs (0.5 mg total) by nebulization 4 (four) times  daily.   levalbuterol 1.25 MG/0.5ML nebulizer solution Commonly known as:  XOPENEX Take 1.25 mg by nebulization every 8 (eight) hours as needed for wheezing or shortness of breath.   metoprolol tartrate 25 MG tablet Commonly known as:  LOPRESSOR TAKE 1 TABLET (25 MG TOTAL) BY MOUTH 2 (TWO) TIMES DAILY.   OXYGEN Inhale 2 L/min into the lungs at bedtime.   predniSONE 10 MG tablet Commonly known as:  DELTASONE Take 1 tablet (10 mg total) by mouth daily with breakfast for 1 day. What changed:    how much to take  how to take this  when to take this  additional instructions   saccharomyces boulardii 250 MG capsule Commonly known as:  FLORASTOR Take 1 capsule (250 mg total) by mouth 2 (two) times daily for 10 days.   sodium chloride 0.65 % Soln nasal spray Commonly known as:  OCEAN Place 1-2 sprays into both nostrils daily as needed for congestion.   VITAMIN D PO Take 1 tablet by mouth daily.      Allergies  Allergen Reactions  . Statins Other (See Comments)    Muscle aches - tolerating Vytorin   Follow-up Information    Lorretta Harp, MD Follow up.   Specialties:  Cardiology, Radiology Why:  office will call Monday or Tuesday with appt.   for paroxysmal afib management Contact information: [address] [place]  [address] [phone]        Crist Infante, MD Follow up in 1 week(s).   Specialty:  Internal Medicine Why:  for hospital discharge follow up. repeat cbc/bmp at follow up. Contact information: [place] [address] [phone]        Chesley Mires, MD Follow up on 10/24/2017.   Specialty:  Pulmonary Disease Why:  please coordinate with Dr Halford Chessman about holding elliquis prior to broncoscopy Contact information: [address] [place] [address] [phone]            The results of significant diagnostics from this hospitalization (including imaging, microbiology, ancillary and laboratory) are listed below for reference.    Significant Diagnostic Studies: Dg Chest 1 View  Result Date: 10/09/2017 CLINICAL DATA:  77 y/o  M; left thoracentesis. EXAM: CHEST  1 VIEW COMPARISON:  10/09/2017 chest radiograph FINDINGS: Stable cardiomegaly. Large left-sided pleural effusion with opacity of left mid and lower lung zones. A no appreciable pneumothorax. Clear right lung. Left shoulder reverse hemiarthroplasty is partially visualized. No acute osseous abnormality identified. IMPRESSION: Large left pleural effusion with opacification of left mid and lower lung zones. No appreciable pneumothorax. Electronically Signed   By: Kristine Garbe M.D.   On: 10/09/2017 18:13   Dg Chest 2 View  Result Date: 10/09/2017 CLINICAL DATA:  Shortness of breath an weakness today. EXAM: CHEST - 2 VIEW COMPARISON:  09/08/2017 FINDINGS: The right chest remains clear. There is extensive pneumonia throughout the left lower lobe with volume loss. Probable associated effusion on the left. IMPRESSION: Extensive left lower lobe pneumonia with volume loss. Probable associated pleural fluid. Electronically Signed   By: Nelson Chimes M.D.   On: 10/09/2017 11:24   Ct Chest Wo Contrast  Result Date: 10/10/2017 CLINICAL DATA:  Pneumonia; history acute on chronic combined systolic and diastolic CHF,  COPD, coronary artery disease, hypertension, sarcoidosis, former smoker EXAM: CT CHEST WITHOUT CONTRAST TECHNIQUE: Multidetector CT imaging of the chest was performed following the standard protocol without IV contrast. Sagittal and coronal MPR images reconstructed from axial data set.  COMPARISON:  Chest radiograph 10/09/2017, CT chest 07/12/2016 FINDINGS: Cardiovascular: Atherosclerotic calcifications aorta, proximal great vessels, and coronary arteries. No pericardial effusion. 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 Mediastinum/Nodes: Numerous calcified mediastinal and hilar lymph nodes again identified consistent with history of sarcoidosis, unchanged. No additional thoracic adenopathy. Esophagus unremarkable. Base of cervical region normal appearance. Lungs/Pleura: LEFT pleural effusion. Consolidation and volume loss in LEFT lower lobe. Calcified pulmonary granulomata. Central peribronchial thickening. Hyperinflated/lucent LEFT upper lobe. Beaded appearance of airways in LEFT lower lobe consistent with bronchiectasis. Diffuse narrowing of the LEFT mainstem bronchus with occlusion of LEFT upper lobe and LEFT lower lobe bronchi at their origins versus previous exam. Large area of soft tissue opacity at the LEFT pulmonary hilum highly concerning for tumor, contiguous extending into mediastinum, associated with occlusion of the LEFT upper lobe and LEFT lower lobe bronchi and marked compression of the distal LEFT mainstem bronchus. Area of soft tissue opacity measures estimated 8.1 x 7.2 x 6.7 cm. Finding is concerning for malignancy invading the hilum and mediastinum. Upper Abdomen: Small cystic lesion with calcification at the RIGHT lobe of the liver 19 x 13 mm image 141. Duodenal diverticulum. Remaining visualized upper abdomen unremarkable. Musculoskeletal: Diffuse osseous demineralization. IMPRESSION: Chronic changes of sarcoidosis. Abnormal soft tissue opacity at the  LEFT pulmonary hilum approximately 8.1 x 7.2 x 6.7 cm in size associated with occlusion of the distal LEFT mainstem bronchus and proximal LEFT upper lobe/LEFT lower lobe bronchi highly suspicious for malignancy invading mediastinum; bronchoscopic evaluation recommended. Postobstructive pneumonitis involving the LEFT lower lobe with associated LEFT lower lobe bronchiectasis and new LEFT pleural effusion. Mild aneurysmal dilatation of the ascending thoracic aorta 4.2 cm greatest size, recommendation below. Recommend annual imaging followup by CTA or MRA. This recommendation follows 2010 ACCF/AHA/AATS/ACR/ASA/SCA/SCAI/SIR/STS/SVM Guidelines for the Diagnosis and Management of Patients with Thoracic Aortic Disease. Circulation. 2010; 121: Z610-R604 Aortic Atherosclerosis (ICD10-I70.0). Electronically Signed   By: Lavonia Dana M.D.   On: 10/10/2017 17:19   US Thoracentesis Asp Pleural Space W/img Guide  Result Date: 10/09/2017 INDICATION: Patient with history of sarcoidosis, coronary artery disease, COPD, CHF, pneumonia, dyspnea, left pleural effusion. Request made for diagnostic and therapeutic left thoracentesis. EXAM: ULTRASOUND GUIDED DIAGNOSTIC AND THERAPEUTIC LEFT THORACENTESIS MEDICATIONS: None COMPLICATIONS: None immediate. PROCEDURE: An ultrasound guided thoracentesis was thoroughly discussed with the patient and questions answered. The benefits, risks, alternatives and complications were also discussed. The patient understands and wishes to proceed with the procedure. Written consent was obtained. Ultrasound was performed to localize and mark an adequate pocket of fluid in the left chest. The area was then prepped and draped in the normal sterile fashion. 1% Lidocaine was used for local anesthesia. Under ultrasound guidance a 6 Fr Safe-T-Centesis catheter was introduced. Thoracentesis was performed. The catheter was removed and a dressing applied. FINDINGS: A total of approximately 1.2 liters of hazy, yellow  fluid was removed. Samples were sent to the laboratory as requested by the clinical team. IMPRESSION: Successful ultrasound guided diagnostic and therapeutic left thoracentesis yielding 1.2 liters of pleural fluid. Read by: Rowe Robert, PA-C Electronically Signed   By: Suzy Bouchard M.D.   On: 10/09/2017 17:40    Microbiology: Recent Results (from the past 240 hour(s))  Culture, blood (routine x 2) Call MD if unable to obtain prior to antibiotics being given     Status: None (Preliminary result)   Collection Time: 10/09/17  1:50 PM  Result Value Ref Range Status   Specimen Description  Final    BLOOD RIGHT ANTECUBITAL Performed at [place] [facility] [address] [place], [place] [address]    Special Requests   Final    BOTTLES DRAWN AEROBIC AND ANAEROBIC Blood Culture adequate volume Performed at [facility] [address] [place], [place] [address]    Culture   Final    NO GROWTH 4 DAYS Performed at [facility], [place] [address] [place], [place] [address]    Report Status PENDING  Incomplete  Culture, blood (routine x 2) Call MD if unable to obtain prior to antibiotics being given     Status: None (Preliminary result)   Collection Time: 10/09/17  1:51 PM  Result Value Ref Range Status   Specimen Description   Final    BLOOD LEFT ANTECUBITAL Performed at [facility] [address] [place], [place] [address]    Special Requests   Final    BOTTLES DRAWN AEROBIC AND ANAEROBIC Blood Culture adequate volume Performed at [facility] [address], [place], [place] [address]    Culture   Final    NO GROWTH 4 DAYS Performed at [facility], [place] [address], [place], [place] [address]    Report Status PENDING  Incomplete  Culture, body fluid-bottle     Status: None (Preliminary result)   Collection Time: 10/09/17  5:28 PM  Result Value Ref Range Status   Specimen Description FLUID LEFT  PLEURAL  Final   Special Requests BOTTLES DRAWN AEROBIC AND ANAEROBIC 10CC  Final   Culture   Final    NO GROWTH 4 DAYS Performed at [facility], [place] [address] [place], [place] [address]    Report Status PENDING  Incomplete  Gram stain     Status: None   Collection Time: 10/09/17  5:28 PM  Result Value Ref Range Status   Specimen Description FLUID LEFT PLEURAL  Final   Special Requests NONE  Final   Gram Stain   Final    ABUNDANT WBC PRESENT, PREDOMINANTLY MONONUCLEAR NO ORGANISMS SEEN Performed at [facility], [place] [address], [place], [place] [address]    Report Status 10/09/2017 FINAL  Final  MRSA PCR Screening     Status: None   Collection Time: 10/10/17 10:31 AM  Result Value Ref Range Status   MRSA by PCR NEGATIVE NEGATIVE Final    Comment:        The GeneXpert MRSA Assay (FDA approved for NASAL specimens only), is one component of a comprehensive MRSA colonization surveillance program. It is not intended to diagnose MRSA infection nor to guide or monitor treatment for MRSA infections. Performed at [facility] [place], [place] [address] [place],  [address]      Labs: Basic Metabolic Panel: Recent Labs  Lab 10/09/17 1150 10/10/17 0549 10/11/17 0606 10/12/17 0533 10/13/17 0605  NA 137 134* 135 135 137  K 3.8 4.0 4.0 3.7 3.8  CL 99* 99* 101 101 101  CO2 24 23 22 23 26   GLUCOSE 143* 182* 185* 154* 129*  BUN 15 15 19 19  21*  CREATININE 1.07 0.91 0.99 0.96 0.91  CALCIUM 9.0 9.2 9.2 8.8* 8.8*  MG  --   --  1.7 2.0  --    Liver Function Tests: Recent Labs  Lab 10/09/17 1150 10/10/17 0549  AST 14* 14*  ALT 12* 11*  ALKPHOS 74 67  BILITOT 0.8 1.2  PROT 6.8 6.7  ALBUMIN 2.8* 2.7*   No results for input(s): LIPASE, AMYLASE in the last 168 hours. No results for input(s): AMMONIA in the last 168 hours. CBC: Recent Labs  Lab 10/09/17 1150 10/10/17 0549 10/11/17 0606 10/12/17 0533 10/13/17 0603  WBC 14.2* 9.3 12.4*  16.1* 13.1*  NEUTROABS 12.3*  --  11.3* 13.8*  --   HGB 12.0* 11.9* 11.1* 11.4* 11.8*  HCT 36.1* 36.3* 34.6* 35.2* 36.1*  MCV 83.4 82.9 83.8 84.2 85.3  PLT 464* 435* 416* 489* 397   Cardiac Enzymes: Recent Labs  Lab 10/09/17 1150  TROPONINI <0.03   BNP: BNP (last 3 results) Recent Labs    09/02/17 1315 10/09/17 1150  BNP 184.2* 106.1*    ProBNP (last 3 results) Recent Labs    08/08/17 0958  PROBNP 92.0    CBG: No results for input(s): GLUCAP in the last 168 hours.     Signed:  Florencia Reasons MD, PhD  [facility] 10/13/2017, 4:36 PM

## 2017-10-13 NOTE — Progress Notes (Signed)
Progress Note  Patient Name: Travis Ferguson Date of Encounter: 10/13/2017  Primary Cardiologist:  Quay Burow, MD  Subjective   Breathing better today, no chest pain. Relieved that there is nothing wrong with his heart. Still w/ intermittent dry cough at times.  Inpatient Medications    Scheduled Meds: . amiodarone  200 mg Oral Daily  . amoxicillin-clavulanate  1 tablet Oral Q12H  . atorvastatin  20 mg Oral q1800  . cholecalciferol  1,000 Units Oral Daily  . DULoxetine  30 mg Oral Daily  . finasteride  5 mg Oral Daily  . FLUoxetine  40 mg Oral Q breakfast  . furosemide  20 mg Oral BID  . guaiFENesin  1,200 mg Oral BID  . HYDROcodone-acetaminophen  1 tablet Oral Q6H  . ipratropium  0.5 mg Nebulization BID  . levalbuterol  1.25 mg Nebulization BID  . metoprolol tartrate  25 mg Oral BID  . mometasone-formoterol  2 puff Inhalation BID  . predniSONE  20 mg Oral Q breakfast  . saccharomyces boulardii  250 mg Oral BID  . sacubitril-valsartan  1 tablet Oral BID   Continuous Infusions:  PRN Meds: acetaminophen, benzonatate, ipratropium-albuterol   Vital Signs    Vitals:   10/12/17 2214 10/13/17 0449 10/13/17 0450 10/13/17 0828  BP: (!) 139/91  125/72   Pulse: 88  79   Resp: 20  20   Temp: 98.5 F (36.9 C)  97.6 F (36.4 C)   TempSrc: Oral  Axillary   SpO2: 98%  99% 95%  Weight:  248 lb 14.4 oz (112.9 kg)    Height:        Intake/Output Summary (Last 24 hours) at 10/13/2017 1022 Last data filed at 10/12/2017 2214 Gross per 24 hour  Intake -  Output 250 ml  Net -250 ml   Filed Weights   10/11/17 0531 10/12/17 0500 10/13/17 0449  Weight: 248 lb 14.4 oz (112.9 kg) 250 lb 7.1 oz (113.6 kg) 248 lb 14.4 oz (112.9 kg)    Telemetry    SR, frequent PVCs, pairs and occ salvos - Personally Reviewed  ECG    None today - Personally Reviewed  Physical Exam   General: Well developed, well nourished, male appearing in no acute distress. Head: Normocephalic,  atraumatic.  Neck: Supple without bruits, JVD not elevated. Lungs:  Resp regular and unlabored, scattered rales w/ decreased BS L base. Heart: RRR, S1, S2, no S3, S4, or murmur; no rub. Abdomen: Soft, non-tender, non-distended with normoactive bowel sounds. No hepatomegaly. No rebound/guarding. No obvious abdominal masses. Extremities: No clubbing, cyanosis, no edema. Distal pedal pulses are 2+ bilaterally. Neuro: Alert and oriented X 3. Moves all extremities spontaneously. Psych: Normal affect.  Labs    Hematology Recent Labs  Lab 10/11/17 0606 10/12/17 0533 10/13/17 0603  WBC 12.4* 16.1* 13.1*  RBC 4.13* 4.18* 4.23  HGB 11.1* 11.4* 11.8*  HCT 34.6* 35.2* 36.1*  MCV 83.8 84.2 85.3  MCH 26.9 27.3 27.9  MCHC 32.1 32.4 32.7  RDW 14.5 14.8 14.8  PLT 416* 489* 397    Chemistry Recent Labs  Lab 10/09/17 1150 10/10/17 0549 10/11/17 0606 10/12/17 0533 10/13/17 0605  NA 137 134* 135 135 137  K 3.8 4.0 4.0 3.7 3.8  CL 99* 99* 101 101 101  CO2 24 23 22 23 26   GLUCOSE 143* 182* 185* 154* 129*  BUN 15 15 19 19  21*  CREATININE 1.07 0.91 0.99 0.96 0.91  CALCIUM 9.0 9.2 9.2 8.8* 8.8*  PROT 6.8 6.7  --   --   --   ALBUMIN 2.8* 2.7*  --   --   --   AST 14* 14*  --   --   --   ALT 12* 11*  --   --   --   ALKPHOS 74 67  --   --   --   BILITOT 0.8 1.2  --   --   --   GFRNONAA >60 >60 >60 >60 >60  GFRAA >60 >60 >60 >60 >60  ANIONGAP 14 12 12 11 10     Magnesium  Date Value Ref Range Status  10/12/2017 2.0 1.7 - 2.4 mg/dL Final    Comment:    Performed at [facility], [place] [address], [place], [place] [address]   Cardiac Enzymes Recent Labs  Lab 10/09/17 1150  TROPONINI <0.03    BNP Recent Labs  Lab 10/09/17 1150  BNP 106.1*     Radiology    Dg Chest 1 View  Result Date: 10/09/2017 CLINICAL DATA:  77 y/o  M; left thoracentesis. EXAM: CHEST  1 VIEW COMPARISON:  10/09/2017 chest radiograph FINDINGS: Stable cardiomegaly. Large left-sided pleural  effusion with opacity of left mid and lower lung zones. A no appreciable pneumothorax. Clear right lung. Left shoulder reverse hemiarthroplasty is partially visualized. No acute osseous abnormality identified. IMPRESSION: Large left pleural effusion with opacification of left mid and lower lung zones. No appreciable pneumothorax. Electronically Signed   By: Kristine Garbe M.D.   On: 10/09/2017 18:13   Dg Chest 2 View  Result Date: 10/09/2017 CLINICAL DATA:  77 y/o Shortness of breath an weakness today. EXAM: CHEST - 2 VIEW COMPARISON:  09/08/2017 FINDINGS: The right chest remains clear. There is extensive pneumonia throughout the left lower lobe with volume loss. Probable associated effusion on the left. IMPRESSION: Extensive left lower lobe pneumonia with volume loss. Probable associated pleural fluid. Electronically Signed   By: Nelson Chimes M.D.   On: 10/09/2017 11:24   Ct Chest Wo Contrast  Result Date: 10/10/2017 CLINICAL DATA:  Pneumonia; history acute on chronic combined systolic and diastolic CHF, COPD, coronary artery disease, hypertension, sarcoidosis, former smoker EXAM: CT CHEST WITHOUT CONTRAST TECHNIQUE: Multidetector CT imaging of the chest was performed following the standard protocol without IV contrast. Sagittal and coronal MPR images reconstructed from axial data set. COMPARISON:  Chest radiograph 10/09/2017, CT chest 07/12/2016 FINDINGS: Cardiovascular: Atherosclerotic calcifications aorta, proximal great vessels, and coronary arteries. No pericardial effusion. 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 Mediastinum/Nodes: Numerous calcified mediastinal and hilar lymph nodes again identified consistent with history of sarcoidosis, unchanged. No additional thoracic adenopathy. Esophagus unremarkable. Base of cervical region normal appearance. Lungs/Pleura: LEFT pleural effusion. Consolidation and volume loss in LEFT lower lobe. Calcified pulmonary  granulomata. Central peribronchial thickening. Hyperinflated/lucent LEFT upper lobe. Beaded appearance of airways in LEFT lower lobe consistent with bronchiectasis. Diffuse narrowing of the LEFT mainstem bronchus with occlusion of LEFT upper lobe and LEFT lower lobe bronchi at their origins versus previous exam. Large area of soft tissue opacity at the LEFT pulmonary hilum highly concerning for tumor, contiguous extending into mediastinum, associated with occlusion of the LEFT upper lobe and LEFT lower lobe bronchi and marked compression of the distal LEFT mainstem bronchus. Area of soft tissue opacity measures estimated 8.1 x 7.2 x 6.7 cm. Finding is concerning for malignancy invading the hilum and mediastinum. Upper Abdomen: Small cystic lesion with calcification at the RIGHT lobe of  the liver 19 x 13 mm image 141. Duodenal diverticulum. Remaining visualized upper abdomen unremarkable. Musculoskeletal: Diffuse osseous demineralization. IMPRESSION: Chronic changes of sarcoidosis. Abnormal soft tissue opacity at the LEFT pulmonary hilum approximately 8.1 x 7.2 x 6.7 cm in size associated with occlusion of the distal LEFT mainstem bronchus and proximal LEFT upper lobe/LEFT lower lobe bronchi highly suspicious for malignancy invading mediastinum; bronchoscopic evaluation recommended. Postobstructive pneumonitis involving the LEFT lower lobe with associated LEFT lower lobe bronchiectasis and new LEFT pleural effusion. Mild aneurysmal dilatation of the ascending thoracic aorta 4.2 cm greatest size, recommendation below. Recommend annual imaging followup by CTA or MRA. This recommendation follows 2010 ACCF/AHA/AATS/ACR/ASA/SCA/SCAI/SIR/STS/SVM Guidelines for the Diagnosis and Management of Patients with Thoracic Aortic Disease. Circulation. 2010; 121: S341-D622 Aortic Atherosclerosis (ICD10-I70.0). Electronically Signed   By: Lavonia Dana M.D.   On: 10/10/2017 17:19   US Thoracentesis Asp Pleural Space W/img  Guide  Result Date: 10/09/2017 INDICATION: Patient with history of sarcoidosis, coronary artery disease, COPD, CHF, pneumonia, dyspnea, left pleural effusion. Request made for diagnostic and therapeutic left thoracentesis. EXAM: ULTRASOUND GUIDED DIAGNOSTIC AND THERAPEUTIC LEFT THORACENTESIS MEDICATIONS: None COMPLICATIONS: None immediate. PROCEDURE: An ultrasound guided thoracentesis was thoroughly discussed with the patient and questions answered. The benefits, risks, alternatives and complications were also discussed. The patient understands and wishes to proceed with the procedure. Written consent was obtained. Ultrasound was performed to localize and mark an adequate pocket of fluid in the left chest. The area was then prepped and draped in the normal sterile fashion. 1% Lidocaine was used for local anesthesia. Under ultrasound guidance a 6 Fr Safe-T-Centesis catheter was introduced. Thoracentesis was performed. The catheter was removed and a dressing applied. FINDINGS: A total of approximately 1.2 liters of hazy, yellow fluid was removed. Samples were sent to the laboratory as requested by the clinical team. IMPRESSION: Successful ultrasound guided diagnostic and therapeutic left thoracentesis yielding 1.2 liters of pleural fluid. Read by: Rowe Robert, PA-C Electronically Signed   By: Suzy Bouchard M.D.   On: 10/09/2017 17:40     Cardiac Studies   ECHO:  09/04/2017 - Left ventricle: The cavity size was normal. Wall thickness was   increased in a pattern of mild LVH. Systolic function was low   normal to mildly reduced. The estimated ejection fraction was in   the range of 50% to 55%. Images were inadequate for LV wall   motion assessment. Doppler parameters are consistent with   abnormal left ventricular relaxation (grade 1 diastolic   dysfunction). - Aortic valve: There was no stenosis. - Mitral valve: There was trivial regurgitation. - Right ventricle: The cavity size was normal.  Systolic function   was normal. - Tricuspid valve: Peak RV-RA gradient (S): 39 mm Hg. - Pulmonary arteries: PA peak pressure: 47 mm Hg (S). - Systemic veins: IVC measured 2.1 cm with >50% respirophasic   variation, suggesting RA pressure 8 mmHg. - Pericardium, extracardiac: Pleural effusion noted.  Impressions:  - Technically difficult study with poor acoustic windows. Normal LV   size with mild LV hypertrophy. Low normal to mildly reduced   systolic function, EF 29-79%. Normal RV size and systolic   function. Mild pulmonary hypertension.  Patient Profile     77 y.o. male w/ hx   Assessment & Plan    DCM:  Non ischemic EF 25-30% 2016, improved with combination of beta blocker and entresto continue BNP low  - volume status good by exam - continue Entresto, metoprolol and Lasix at home doses  Left Effusion: post thoracentesis 1.2 L, BNP only 106  - Likely Related to primary lung process, Exudative by Light's criteria - Lymphocytic exudate with L hilar mass and endobronchial mass>> lung cancer   PAF:  Maintaining NSR on eliquis Amiodarone per JB despite chronic lung issues - Dr Johnsie Cancel recommends consider changing to Multaq in future - apixaban on hold in case he needs bronchoscopy  Pulmonary:  Primary issue COPD, post-obstructive Pneumonia, 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  - Continue maxipime nebs and prednisone per IM - Looks like new diagnosis of advanced stage Lung cancer  PVCs - per pt, he has these chronically - K+ and Mg not low - no sx  Active Problems:   Dyspnea   HCAP (healthcare-associated pneumonia)   S/P thoracentesis    Jonetta Speak , PA-C 10:22 AM 10/13/2017 Pager: [phone]

## 2017-10-14 ENCOUNTER — Telehealth: Payer: Self-pay | Admitting: Pulmonary Disease

## 2017-10-14 DIAGNOSIS — G4733 Obstructive sleep apnea (adult) (pediatric): Secondary | ICD-10-CM | POA: Diagnosis not present

## 2017-10-14 LAB — CULTURE, BODY FLUID W GRAM STAIN -BOTTLE: Culture: NO GROWTH

## 2017-10-14 LAB — CULTURE, BLOOD (ROUTINE X 2)
Culture: NO GROWTH
Culture: NO GROWTH
SPECIAL REQUESTS: ADEQUATE
Special Requests: ADEQUATE

## 2017-10-14 LAB — CULTURE, BODY FLUID-BOTTLE

## 2017-10-14 NOTE — Telephone Encounter (Signed)
Spoke with pt about Lt thoracentesis results.  Still have concern for Lt lung mass and need to r/o malignancy.  He is agreeable to have bronchoscopy.  Scheduled for 7:30 AM on 10/17/17 at [facility].  Advised to be NPO after midnight, not take eliquis until notified after the procedure, and arrange for transportation to/from hospital.

## 2017-10-15 NOTE — Telephone Encounter (Signed)
Patient contacted regarding discharge from [facility] on 10/13/17 .  Patient understands to follow up with provider KILROY on 10/22/17 at 8:40 AM at [facility]. Patient understands discharge instructions? yes  Patient understands medications and regiment? yes Patient understands to bring all medications to this visit? yes    Patient  States she has  Only take one dose of eliquis  Since leaving the hospital due to having a bronchoscopy on frIday 10/17/17 by Dr Halford Chessman.   RN reviewed discharge -  Discharge physician comments on what patient is to do.

## 2017-10-16 ENCOUNTER — Telehealth: Payer: Self-pay | Admitting: Pulmonary Disease

## 2017-10-16 MED ORDER — SODIUM CHLORIDE 0.9 % IN NEBU: INHALATION_SOLUTION | RESPIRATORY_TRACT | 11 refills | Status: DC

## 2017-10-16 NOTE — Telephone Encounter (Signed)
rx called in to pharmacy.  Nothing further needed.

## 2017-10-16 NOTE — Telephone Encounter (Signed)
Okay to send order. 

## 2017-10-16 NOTE — Telephone Encounter (Signed)
Pharmacy called, requesting an order for saline to go with xopenex concentrate that was sent to pharmacy from recent hospital stay.  VS please advise if ok to sent in diluting saline.  Thanks!

## 2017-10-17 ENCOUNTER — Ambulatory Visit (HOSPITAL_COMMUNITY)
Admission: RE | Admit: 2017-10-17 | Discharge: 2017-10-17 | Disposition: A | Discharge status: Home / Self Care | Payer: Medicare Other | Source: Ambulatory Visit | Attending: Pulmonary Disease | Admitting: Pulmonary Disease

## 2017-10-17 ENCOUNTER — Ambulatory Visit (HOSPITAL_COMMUNITY): Payer: Medicare Other

## 2017-10-17 ENCOUNTER — Encounter (HOSPITAL_COMMUNITY)
Admission: RE | Disposition: A | Discharge status: Home / Self Care | Payer: Self-pay | Source: Ambulatory Visit | Attending: Pulmonary Disease

## 2017-10-17 ENCOUNTER — Telehealth: Payer: Self-pay | Admitting: Pulmonary Disease

## 2017-10-17 ENCOUNTER — Encounter (HOSPITAL_COMMUNITY): Payer: Self-pay | Admitting: Respiratory Therapy

## 2017-10-17 DIAGNOSIS — Z888 Allergy status to other drugs, medicaments and biological substances status: Secondary | ICD-10-CM | POA: Diagnosis not present

## 2017-10-17 DIAGNOSIS — I2510 Atherosclerotic heart disease of native coronary artery without angina pectoris: Secondary | ICD-10-CM | POA: Diagnosis not present

## 2017-10-17 DIAGNOSIS — K219 Gastro-esophageal reflux disease without esophagitis: Secondary | ICD-10-CM | POA: Diagnosis not present

## 2017-10-17 DIAGNOSIS — I5042 Chronic combined systolic (congestive) and diastolic (congestive) heart failure: Secondary | ICD-10-CM | POA: Insufficient documentation

## 2017-10-17 DIAGNOSIS — Z87891 Personal history of nicotine dependence: Secondary | ICD-10-CM | POA: Insufficient documentation

## 2017-10-17 DIAGNOSIS — E785 Hyperlipidemia, unspecified: Secondary | ICD-10-CM | POA: Diagnosis not present

## 2017-10-17 DIAGNOSIS — J449 Chronic obstructive pulmonary disease, unspecified: Secondary | ICD-10-CM | POA: Diagnosis not present

## 2017-10-17 DIAGNOSIS — R918 Other nonspecific abnormal finding of lung field: Secondary | ICD-10-CM

## 2017-10-17 DIAGNOSIS — I110 Hypertensive heart disease with heart failure: Secondary | ICD-10-CM | POA: Insufficient documentation

## 2017-10-17 DIAGNOSIS — F329 Major depressive disorder, single episode, unspecified: Secondary | ICD-10-CM | POA: Diagnosis not present

## 2017-10-17 DIAGNOSIS — I712 Thoracic aortic aneurysm, without rupture: Secondary | ICD-10-CM | POA: Insufficient documentation

## 2017-10-17 DIAGNOSIS — Z79899 Other long term (current) drug therapy: Secondary | ICD-10-CM | POA: Insufficient documentation

## 2017-10-17 DIAGNOSIS — I700 Atherosclerosis of aorta: Secondary | ICD-10-CM | POA: Diagnosis not present

## 2017-10-17 DIAGNOSIS — D869 Sarcoidosis, unspecified: Secondary | ICD-10-CM | POA: Insufficient documentation

## 2017-10-17 DIAGNOSIS — J90 Pleural effusion, not elsewhere classified: Secondary | ICD-10-CM | POA: Insufficient documentation

## 2017-10-17 DIAGNOSIS — F419 Anxiety disorder, unspecified: Secondary | ICD-10-CM | POA: Insufficient documentation

## 2017-10-17 DIAGNOSIS — C3412 Malignant neoplasm of upper lobe, left bronchus or lung: Secondary | ICD-10-CM | POA: Insufficient documentation

## 2017-10-17 DIAGNOSIS — G4733 Obstructive sleep apnea (adult) (pediatric): Secondary | ICD-10-CM | POA: Insufficient documentation

## 2017-10-17 DIAGNOSIS — Z9889 Other specified postprocedural states: Secondary | ICD-10-CM

## 2017-10-17 HISTORY — PX: VIDEO BRONCHOSCOPY: SHX5072

## 2017-10-17 SURGERY — BRONCHOSCOPY, WITH FLUOROSCOPY
Anesthesia: Moderate Sedation | Laterality: Bilateral

## 2017-10-17 MED ORDER — LIDOCAINE HCL 2 % EX GEL
1.0000 "application " | Freq: Once | CUTANEOUS | Status: DC
  Filled 2017-10-17: qty 5

## 2017-10-17 MED ORDER — FENTANYL CITRATE (PF) 100 MCG/2ML IJ SOLN
INTRAMUSCULAR | Status: AC
  Filled 2017-10-17: qty 4

## 2017-10-17 MED ORDER — SODIUM CHLORIDE 0.9 % IV SOLN
INTRAVENOUS | Status: DC
Start: 2017-10-17 — End: 2017-10-17
  Administered 2017-10-17: 08:00:00 via INTRAVENOUS

## 2017-10-17 MED ORDER — MIDAZOLAM HCL 5 MG/ML IJ SOLN
INTRAMUSCULAR | Status: AC
  Filled 2017-10-17: qty 2

## 2017-10-17 MED ORDER — PHENYLEPHRINE HCL 0.25 % NA SOLN: 1.0000 | Freq: Four times a day (QID) | NASAL | Status: DC | PRN

## 2017-10-17 MED ORDER — FENTANYL CITRATE (PF) 100 MCG/2ML IJ SOLN
INTRAMUSCULAR | Status: DC | PRN
  Administered 2017-10-17: 100 ug via INTRAVENOUS

## 2017-10-17 MED ORDER — ALBUTEROL SULFATE (2.5 MG/3ML) 0.083% IN NEBU: 2.5000 mg | INHALATION_SOLUTION | Freq: Once | RESPIRATORY_TRACT | Status: DC

## 2017-10-17 MED ORDER — LIDOCAINE HCL (PF) 1 % IJ SOLN
INTRAMUSCULAR | Status: DC | PRN
  Administered 2017-10-17: 6 mL

## 2017-10-17 MED ORDER — BUTAMBEN-TETRACAINE-BENZOCAINE 2-2-14 % EX AERO: 1.0000 | INHALATION_SPRAY | Freq: Once | CUTANEOUS | Status: DC

## 2017-10-17 MED ORDER — MIDAZOLAM HCL 10 MG/2ML IJ SOLN
INTRAMUSCULAR | Status: DC | PRN
  Administered 2017-10-17: 2 mg via INTRAVENOUS
  Administered 2017-10-17: 1 mg via INTRAVENOUS
  Administered 2017-10-17: 2 mg via INTRAVENOUS

## 2017-10-17 NOTE — Progress Notes (Signed)
Video Bronchoscopy done Intervention Bronchial biopsy done Procedure tolerated well

## 2017-10-17 NOTE — Discharge Instructions (Signed)
Flexible Bronchoscopy, Care After These instructions give you information on caring for yourself after your procedure. Your doctor may also give you more specific instructions. Call your doctor if you have any problems or questions after your procedure. Follow these instructions at home:  Do not eat or drink anything for 2 hours after your procedure. If you try to eat or drink before the medicine wears off, food or drink could go into your lungs. You could also burn yourself.  After 2 hours have passed and when you can cough and gag normally, you may eat soft food and drink liquids slowly.  The day after the test, you may eat your normal diet.  You may do your normal activities.  Keep all doctor visits. Get help right away if:  You get more and more short of breath.  You get light-headed.  You feel like you are going to pass out (faint).  You have chest pain.  You have new problems that worry you.  You cough up more than a little blood.  You cough up more blood than before. This information is not intended to replace advice given to you by your health care provider. Make sure you discuss any questions you have with your health care provider. Document Released: 04/07/2009 Document Revised: 11/16/2015 Document Reviewed: 02/12/2013 Elsevier Interactive Patient Education  2017 [place].   Nothing to eat or drink until   10:00   am today  10/17/2017. Any questions or concerns please call the office at [phone]

## 2017-10-17 NOTE — Telephone Encounter (Signed)
Spoke with pt's spouse, answered questions regarding medications post bronch.  Nothing further needed.

## 2017-10-17 NOTE — Telephone Encounter (Signed)
HST 10/07/17 >> AHI 7.8, SpO2 low 82%.   D/w pt.  Will review in more detail at next ROV.

## 2017-10-17 NOTE — H&P (Signed)
[facility]  Date: 10/17/17   CC: Lung mass  HPI: 77 yo male former smoker with hx of COPD and sarcoid was admitted to hospital recently with dizziness, chest pain, and shortness of breath.  He was found to have Lt lung mass and pleural effusion.  Thoracentesis results were non diagnostic.  He presents for bronchoscopy.  He  has a past medical history of Acute on chronic combined systolic and diastolic CHF (congestive heart failure) ([place]) (02/24/2015), Anxiety, Arthritis, Atherosclerosis, CAP (community acquired pneumonia) (02/24/2015), Cervical disc disease, Chronic lower back pain, Colon polyps (08/02/2014), COPD (chronic obstructive pulmonary disease) ([place]), Coronary artery calcification seen on CAT scan, Depression, Dyslipidemia, Dyspnea, GERD (gastroesophageal reflux disease), Hiatal hernia, History of blood transfusion ("numerous"), History of oxygen administration, Hypertension, Inguinal hernia, Migraine, OSA (obstructive sleep apnea), PTSD (post-traumatic stress disorder), Rhinitis, Sarcoidosis, Sensation problem, Sepsis ([place]), Steroid-induced hyperglycemia (02/25/2015), and Subdural hematoma ([place]) (11/10).  He  has a past surgical history that includes Video bronchoscopy (07/28/2012); Knee arthroscopy (Right); Lumbar laminectomy/decompression microdiscectomy (Right, 12/15/2012); Gallbladder surgery (09/2014); ERCP (N/A, 11/07/2014); Cholecystectomy (N/A, 11/08/2014); Cardiac catheterization (N/A, 01/23/2015); Reverse total shoulder arthroplasty (Left, 09/22/2015); Inguinal hernia repair (Left); Back surgery; Brain surgery (04/2009); and Reverse shoulder arthroplasty (Left, 09/22/2015).  His family history includes Colon polyps in his brother; Congestive Heart Failure in his brother; Coronary artery disease in his unknown relative; Heart disease in his father and mother; Stomach cancer in his paternal grandmother.  He  reports that he quit smoking about 19 years ago. His  smoking use included pipe. He smoked 0.00 packs per day for 30.00 years. He has never used smokeless tobacco. He reports that he does not drink alcohol or use drugs.   Allergies  Allergen Reactions  . Statins Other (See Comments)    Muscle aches - tolerating Vytorin    No current facility-administered medications on file prior to encounter.    Current Outpatient Medications on File Prior to Encounter  Medication Sig  . acetaminophen (TYLENOL) 650 MG CR tablet Take 650 mg by mouth daily as needed for pain.  [name] amiodarone (PACERONE) 200 MG tablet Take 1 tablet (200 mg total) by mouth daily.  [name] amoxicillin-clavulanate (AUGMENTIN) 875-125 MG tablet Take 1 tablet by mouth every 12 (twelve) hours for 6 days.  [name] apixaban (ELIQUIS) 5 MG TABS tablet Take 1 tablet (5 mg total) by mouth 2 (two) times daily.  [name] atorvastatin (LIPITOR) 20 MG tablet Take 1 tablet (20 mg total) by mouth daily at 6 PM.  . benzonatate (TESSALON) 200 MG capsule Take 1 capsule (200 mg total) by mouth 3 (three) times daily as needed for cough.  . budesonide-formoterol (SYMBICORT) 160-4.5 MCG/ACT inhaler Inhale 2 puffs into the lungs 2 (two) times daily.  . chlorhexidine (PERIDEX) 0.12 % solution chlorhexidine gluconate 0.12 % mouthwash  . Cholecalciferol (VITAMIN D PO) Take 1 tablet by mouth daily.  . DULoxetine (CYMBALTA) 30 MG capsule Take 30 mg by mouth daily.  [name] ENTRESTO 24-26 MG TAKE 1 TABLET BY MOUTH 2 (TWO) TIMES DAILY.  . finasteride (PROSCAR) 5 MG tablet Take 5 mg by mouth daily.   [name] FLUoxetine (PROZAC) 40 MG capsule Take 40 mg by mouth daily with breakfast.   . furosemide (LASIX) 20 MG tablet Take 1 tablet (20 mg total) by mouth 2 (two) times daily.  [name] guaiFENesin (MUCINEX) 600 MG 12 hr tablet Take 1 tablet (600 mg total) by mouth 2 (two) times daily.  [name] HYDROcodone-acetaminophen (NORCO/VICODIN) 5-325 MG  tablet Take 1 tablet by mouth every 8 (eight) hours as needed for moderate pain.   [name] ipratropium (ATROVENT) 0.02  % nebulizer solution Take 2.5 mLs (0.5 mg total) by nebulization 4 (four) times daily.  [name] levalbuterol (XOPENEX) 1.25 MG/0.5ML nebulizer solution Take 1.25 mg by nebulization every 8 (eight) hours as needed for wheezing or shortness of breath.  . metoprolol tartrate (LOPRESSOR) 25 MG tablet TAKE 1 TABLET (25 MG TOTAL) BY MOUTH 2 (TWO) TIMES DAILY.  [name] OXYGEN Inhale 2 L/min into the lungs at bedtime.  . saccharomyces boulardii (FLORASTOR) 250 MG capsule Take 1 capsule (250 mg total) by mouth 2 (two) times daily for 10 days.  . sodium chloride (OCEAN) 0.65 % SOLN nasal spray Place 1-2 sprays into both nostrils daily as needed for congestion.    ROS:  Has chronic back and shoulder pain.  Remainder of 12 point ROS negative.  Vital signs: Temp 98 F (36.7 C) (Oral)   Resp 20   Physical exam:   General - pleasant Eyes - pupils reactive ENT - no sinus tenderness, no oral exudate, no LAN Cardiac - regular, no murmur Chest - no wheeze, rales Abd - soft, non tender Ext - no edema Skin - no rashes Neuro - normal strength Psych - normal mood   CMP Latest Ref Rng & Units 10/13/2017 10/12/2017 10/11/2017  Glucose 65 - 99 mg/dL 129(H) 154(H) 185(H)  BUN 6 - 20 mg/dL 21(H) 19 19  Creatinine 0.61 - 1.24 mg/dL 0.91 0.96 0.99  Sodium 135 - 145 mmol/L 137 135 135  Potassium 3.5 - 5.1 mmol/L 3.8 3.7 4.0  Chloride 101 - 111 mmol/L 101 101 101  CO2 22 - 32 mmol/L 26 23 22   Calcium 8.9 - 10.3 mg/dL 8.8(L) 8.8(L) 9.2  Total Protein 6.5 - 8.1 g/dL - - -  Total Bilirubin 0.3 - 1.2 mg/dL - - -  Alkaline Phos 38 - 126 U/L - - -  AST 15 - 41 U/L - - -  ALT 17 - 63 U/L - - -    CBC Latest Ref Rng & Units 10/13/2017 10/12/2017 10/11/2017  WBC 4.0 - 10.5 K/uL 13.1(H) 16.1(H) 12.4(H)  Hemoglobin 13.0 - 17.0 g/dL 11.8(L) 11.4(L) 11.1(L)  Hematocrit 39.0 - 52.0 % 36.1(L) 35.2(L) 34.6(L)  Platelets 150 - 400 K/uL 397 489(H) 416(H)    EXAM: CT CHEST WITHOUT CONTRAST  TECHNIQUE: Multidetector CT imaging  of the chest was performed following the standard protocol without IV contrast. Sagittal and coronal MPR images reconstructed from axial data set.  COMPARISON:  Chest radiograph 10/09/2017, CT chest 07/12/2016  FINDINGS: Cardiovascular: Atherosclerotic calcifications aorta, proximal great vessels, and coronary arteries. No pericardial effusion. 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  Mediastinum/Nodes: Numerous calcified mediastinal and hilar lymph nodes again identified consistent with history of sarcoidosis, unchanged. No additional thoracic adenopathy. Esophagus unremarkable. Base of cervical region normal appearance.  Lungs/Pleura: LEFT pleural effusion. Consolidation and volume loss in LEFT lower lobe. Calcified pulmonary granulomata. Central peribronchial thickening. Hyperinflated/lucent LEFT upper lobe. Beaded appearance of airways in LEFT lower lobe consistent with bronchiectasis. Diffuse narrowing of the LEFT mainstem bronchus with occlusion of LEFT upper lobe and LEFT lower lobe bronchi at their origins versus previous exam. Large area of soft tissue opacity at the LEFT pulmonary hilum highly concerning for tumor, contiguous extending into mediastinum, associated with occlusion of the LEFT upper lobe and LEFT lower lobe bronchi and marked compression of the distal LEFT mainstem  bronchus. Area of soft tissue opacity measures estimated 8.1 x 7.2 x 6.7 cm. Finding is concerning for malignancy invading the hilum and mediastinum.  Upper Abdomen: Small cystic lesion with calcification at the RIGHT lobe of the liver 19 x 13 mm image 141. Duodenal diverticulum. Remaining visualized upper abdomen unremarkable.  Musculoskeletal: Diffuse osseous demineralization.  IMPRESSION: Chronic changes of sarcoidosis.  Abnormal soft tissue opacity at the LEFT pulmonary hilum approximately 8.1 x 7.2 x 6.7 cm in size associated with  occlusion of the distal LEFT mainstem bronchus and proximal LEFT upper lobe/LEFT lower lobe bronchi highly suspicious for malignancy invading mediastinum; bronchoscopic evaluation recommended.  Postobstructive pneumonitis involving the LEFT lower lobe with associated LEFT lower lobe bronchiectasis and new LEFT pleural effusion.  Mild aneurysmal dilatation of the ascending thoracic aorta 4.2 cm greatest size, recommendation below.  Recommend annual imaging followup by CTA or MRA. This recommendation follows 2010 ACCF/AHA/AATS/ACR/ASA/SCA/SCAI/SIR/STS/SVM Guidelines for the Diagnosis and Management of Patients with Thoracic Aortic Disease. Circulation. 2010; 121: V748-O707  Aortic Atherosclerosis (ICD10-I70.0).   Electronically Signed   By: Lavonia Dana M.D.   On: 10/10/2017 17:19   Assessment/plan:  Lt lung mass. - bronchoscopy - procedure explained to patient - risks detailed as bleeding, infection, pneumothorax, and non diagnosis  Chesley Mires, MD [place] 10/17/2017, 7:27 AM

## 2017-10-17 NOTE — Op Note (Signed)
[facility] Cardiopulmonary Patient Name: Travis Ferguson Procedure Date: 10/17/2017 MRN: [id number] Attending MD: Chesley Mires , MD Date of Birth: 09/30/1940 CSN: [id number] Age: 77 Admit Type: Outpatient Ethnicity: Not Hispanic or Latino Procedure:            Bronchoscopy Indications:          Left mainstem mass, Left upper lobe mass Providers:            Chesley Mires, MD, Andre Lefort RRT,RCP, Ashley Mariner RRT,RCP Referring MD:          Medicines:            Fentanyl 100 mcg IV, Midazolam 5 mg IV, Lidocaine 2%                        applied to cords 12 mL Complications:        No immediate complications. Estimated blood loss:                        Minimal Estimated Blood Loss: Estimated blood loss was minimal. Procedure:      Pre-Anesthesia Assessment:      - A History and Physical has been performed. The patient's medications,       allergies and sensitivities have been reviewed.      - The risks and benefits of the procedure and the sedation options and       risks were discussed with the patient. All questions were answered and       informed consent was obtained.      - Pre-procedure physical examination revealed no contraindications to       sedation.      - Prior Anticoagulants: The patient has taken Eliquis (apixaban), last       dose was 2 days prior to procedure.      After obtaining informed consent, the bronchoscope was passed under       direct vision. Throughout the procedure, the patient's blood pressure,       pulse, and oxygen saturations were monitored continuously. the [id number]       ([id number]) scope was introduced through the mouth and advanced to the       tracheobronchial tree of both lungs. The procedure was accomplished       without difficulty. The patient tolerated the procedure well. The total       duration of the procedure was 23 minutes. Total fluoroscopy time was. Findings:      The nasopharynx/oropharynx  appears normal. The larynx appears normal.       The vocal cords appear normal. The subglottic space is normal. The       trachea is of normal caliber. The carina is sharp. The tracheobronchial       tree of the right lung was examined to at least the first subsegmental       level. Bronchial mucosa and anatomy in the right lung are normal; there       are no endobronchial lesions, and no secretions.      Left Lung Abnormalities: Extrinsic compression was found. The airway is       moderately narrowed. A partially obstructing lesion was found distally.       A partially obstructing mass was found in the left upper  lobe.      Transbronchial biopsies of a mass were performed in the lung and sent       for histopathology examination. The procedure was guided by fluoroscopy.       Biopsy of lung tissue was obtained. Five biopsy passes were performed.       Five biopsy samples were obtained. Impression:      - Left mainstem mass      - Left upper lobe mass      - The airway examination of the right lung was normal.      - Extrinsic compression was found secondary to a mass.      - A lesion was found.      - A mass was found in the left upper lobe. This lesion is likely       malignant.      - Transbronchial lung biopsies were performed. Moderate Sedation:      Moderate (conscious) sedation was personally administered by the       endoscopist. The following parameters were monitored: oxygen saturation,       heart rate, blood pressure, and response to care. Total physician       intraservice time was 20 minutes. Recommendation:      - Await test results.      - Discharge patient to home (ambulatory). Procedure Code(s):      --- Professional ---      [phone], Bronchoscopy, rigid or flexible, including fluoroscopic guidance,       when performed; diagnostic, with cell washing, when performed (separate       procedure)      [phone], Unlisted procedure, trachea, bronchi Diagnosis Code(s):       --- Professional ---      R91.8, Other nonspecific abnormal finding of lung field      J98.4, Other disorders of lung      J98.9, Respiratory disorder, unspecified CPT copyright 2017 American Medical Association. All rights reserved. The codes documented in this report are preliminary and upon coder review may  be revised to meet current compliance requirements. Chesley Mires, MD Chesley Mires, MD 10/17/2017 8:23:56 AM This report has been signed electronically. Number of Addenda: 0 Scope In: 7:51:12 AM Scope Out: 8:00:00 AM

## 2017-10-19 ENCOUNTER — Other Ambulatory Visit: Payer: Self-pay | Admitting: Pulmonary Disease

## 2017-10-20 ENCOUNTER — Encounter (HOSPITAL_COMMUNITY): Payer: Self-pay | Admitting: Pulmonary Disease

## 2017-10-21 ENCOUNTER — Telehealth: Payer: Self-pay | Admitting: Pulmonary Disease

## 2017-10-21 ENCOUNTER — Ambulatory Visit: Payer: Medicare Other | Admitting: Cardiovascular Disease

## 2017-10-21 DIAGNOSIS — C3492 Malignant neoplasm of unspecified part of left bronchus or lung: Secondary | ICD-10-CM

## 2017-10-21 NOTE — Telephone Encounter (Signed)
Bronchoscopy 10/17/17 >> poorly differentiated NSCLC   Results d/w pt.  Will arrange for referral to multidisciplinary thoracic oncology conference [facility]).

## 2017-10-22 ENCOUNTER — Encounter: Payer: Self-pay | Admitting: Cardiology

## 2017-10-22 ENCOUNTER — Ambulatory Visit: Payer: Medicare Other | Admitting: Cardiology

## 2017-10-22 DIAGNOSIS — I2510 Atherosclerotic heart disease of native coronary artery without angina pectoris: Secondary | ICD-10-CM

## 2017-10-22 DIAGNOSIS — I428 Other cardiomyopathies: Secondary | ICD-10-CM | POA: Diagnosis not present

## 2017-10-22 DIAGNOSIS — I10 Essential (primary) hypertension: Secondary | ICD-10-CM

## 2017-10-22 DIAGNOSIS — I420 Dilated cardiomyopathy: Secondary | ICD-10-CM | POA: Diagnosis not present

## 2017-10-22 DIAGNOSIS — C3412 Malignant neoplasm of upper lobe, left bronchus or lung: Secondary | ICD-10-CM | POA: Diagnosis not present

## 2017-10-22 DIAGNOSIS — I483 Typical atrial flutter: Secondary | ICD-10-CM

## 2017-10-22 DIAGNOSIS — C3432 Malignant neoplasm of lower lobe, left bronchus or lung: Secondary | ICD-10-CM | POA: Insufficient documentation

## 2017-10-22 DIAGNOSIS — I480 Paroxysmal atrial fibrillation: Secondary | ICD-10-CM

## 2017-10-22 MED ORDER — AMIODARONE HCL 100 MG PO TABS: 100.0000 mg | ORAL_TABLET | Freq: Every day | ORAL | 6 refills | Status: DC

## 2017-10-22 NOTE — Assessment & Plan Note (Signed)
Atrial flutter March 2019- Amiodarone and Eliquis-  NSR at f/u may 1st 2019

## 2017-10-22 NOTE — Assessment & Plan Note (Signed)
Scattered 40% CAD at cath June 2016

## 2017-10-22 NOTE — Assessment & Plan Note (Signed)
EF 50-55% by echo March 2019

## 2017-10-22 NOTE — Assessment & Plan Note (Signed)
Controlled.  

## 2017-10-22 NOTE — Progress Notes (Signed)
10/22/2017 Travis Ferguson   12-Oct-1940  [id number]  Primary Physician Crist Infante, MD Primary Cardiologist: Dr Gwenlyn Found  HPI:  77 y/o mal followed by Dr Gwenlyn Found and Dr Halford Chessman. He has a history of a NICM, COPD, and sarcoid. He was admitted to [facility] 10/09/17 with dyspnea and fatigue. Cardiology was asked to see him for PAF. He apparently had PAF on an earlier admission in March in the setting of pneumonia. The plan was to stop the Amiodarone in a month if he recovered from his pneumonia and held NSR. He was not placed on Eliquis in March because of a past history of a SDH. During his hospitalization in April he had recurrent PAF. Eliquis was added then. He was also diagnosed with lung cancer. He will need surgery, oncology and surgical work up is pending. He I in the office today for follow up. He is in NSR. By mistake his wife has been giving him Amiodarone 200 mg BID instead of QD. During his recent  hospitalization there was talk of changing him to [facility]. The pt also stopped his Eliquis on his own once he found out he had lung cancer and will most likely have surgery. Symptomatically he is stable.   Current Outpatient Medications  Medication Sig Dispense Refill  . acetaminophen (TYLENOL) 650 MG CR tablet Take 650 mg by mouth daily as needed for pain.    [name] albuterol (PROVENTIL HFA;VENTOLIN HFA) 108 (90 Base) MCG/ACT inhaler 1-2 PUFFS EVERY 4-6 HOURS AS NEEDED FOR SHORTNESS OF BREATH 18 g 1  . amiodarone (PACERONE) 100 MG tablet Take 1 tablet (100 mg total) by mouth daily. 30 tablet 6  . apixaban (ELIQUIS) 5 MG TABS tablet Take 1 tablet (5 mg total) by mouth 2 (two) times daily. 60 tablet 0  . atorvastatin (LIPITOR) 20 MG tablet Take 1 tablet (20 mg total) by mouth daily at 6 PM. 30 tablet 0  . benzonatate (TESSALON) 200 MG capsule Take 1 capsule (200 mg total) by mouth 3 (three) times daily as needed for cough. 30 capsule 1  . budesonide-formoterol (SYMBICORT) 160-4.5 MCG/ACT inhaler Inhale 2 puffs into  the lungs 2 (two) times daily. 1 Inhaler 0  . chlorhexidine (PERIDEX) 0.12 % solution chlorhexidine gluconate 0.12 % mouthwash    . Cholecalciferol (VITAMIN D PO) Take 1 tablet by mouth daily.    . DULoxetine (CYMBALTA) 30 MG capsule Take 30 mg by mouth daily.    [name] ENTRESTO 24-26 MG TAKE 1 TABLET BY MOUTH 2 (TWO) TIMES DAILY. 180 tablet 1  . finasteride (PROSCAR) 5 MG tablet Take 5 mg by mouth daily.     [name] FLUoxetine (PROZAC) 40 MG capsule Take 40 mg by mouth daily with breakfast.     . furosemide (LASIX) 20 MG tablet Take 1 tablet (20 mg total) by mouth 2 (two) times daily. 60 tablet 0  . guaiFENesin (MUCINEX) 600 MG 12 hr tablet Take 1 tablet (600 mg total) by mouth 2 (two) times daily. 60 tablet 2  . HYDROcodone-acetaminophen (NORCO/VICODIN) 5-325 MG tablet Take 1 tablet by mouth every 8 (eight) hours as needed for moderate pain.     [name] ipratropium (ATROVENT) 0.02 % nebulizer solution Take 2.5 mLs (0.5 mg total) by nebulization 4 (four) times daily. 300 mL 12  . levalbuterol (XOPENEX) 1.25 MG/0.5ML nebulizer solution Take 1.25 mg by nebulization every 8 (eight) hours as needed for wheezing or shortness of breath. 1 each 0  . metoprolol tartrate (LOPRESSOR) 25 MG tablet  TAKE 1 TABLET (25 MG TOTAL) BY MOUTH 2 (TWO) TIMES DAILY. 90 tablet 1  . OXYGEN Inhale 2 L/min into the lungs at bedtime.    . saccharomyces boulardii (FLORASTOR) 250 MG capsule Take 1 capsule (250 mg total) by mouth 2 (two) times daily for 10 days. 20 capsule 0  . sodium chloride (OCEAN) 0.65 % SOLN nasal spray Place 1-2 sprays into both nostrils daily as needed for congestion.    . sodium chloride 0.9 % nebulizer solution Use to dilute Xopenex neb solution 300 mL 11   No current facility-administered medications for this visit.     Allergies  Allergen Reactions  . Statins Other (See Comments)    Muscle aches - tolerating Vytorin    Past Medical History:  Diagnosis Date  . Acute on chronic combined systolic and  diastolic CHF (congestive heart failure) ([place]) 02/24/2015  . Anxiety   . Arthritis    "about q joint I've got" (09/22/2015)  . Atherosclerosis   . CAP (community acquired pneumonia) 02/24/2015  . Cervical disc disease   . Chronic lower back pain    chronic back pain/under pain management  . Colon polyps 08/02/2014   Tubular adenoma x 3, Hyperplastic-1  . COPD (chronic obstructive pulmonary disease) ([place])    a. Former Airline pilot, also smoked a pipe.  . Coronary artery calcification seen on CAT scan   . Depression   . Dyslipidemia   . Dyspnea    chronic  . GERD (gastroesophageal reflux disease)   . Hiatal hernia    sensation problem ("right lateral side hip to knee").  . History of blood transfusion "numerous"  . History of oxygen administration    @ 2 l/m nasally at bedtime.(09/22/2015)  . Hypertension   . Inguinal hernia    right side  . Migraine    "years since I've had one" (09/22/2015)  . OSA (obstructive sleep apnea)    does not use CPAP; "just oxygen" (09/22/2015)  . PTSD (post-traumatic stress disorder)   . Rhinitis   . Sarcoidosis   . Sensation problem    right side-lateral hip to knee" decreased sensation and tingling feeling" -has informed Dr. Joylene Draft, Dr. Henrene Pastor, Dr. Lucia Gaskins of this.  . Sepsis ([place])   . Steroid-induced hyperglycemia 02/25/2015  . Subdural hematoma ([place]) 11/10   a. 2010 s/p surgery - diagnosed several weeks after a fall.    Social History   Socioeconomic History  . Marital status: Married    Spouse name: Not on file  . Number of children: 2  . Years of education: 104  . Highest education level: Not on file  Occupational History  . Occupation: retired Airline pilot in [employer]  . Financial resource strain: Not on file  . Food insecurity:    Worry: Not on file    Inability: Not on file  . Transportation needs:    Medical: Not on file    Non-medical: Not on file  Tobacco Use  . Smoking status: Former Smoker    Packs/day: 0.00    Years:  30.00    Pack years: 0.00    Types: Pipe    Last attempt to quit: 06/24/1998    Years since quitting: 19.3  . Smokeless tobacco: Never Used  Substance and Sexual Activity  . Alcohol use: No  . Drug use: No  . Sexual activity: Not Currently  Lifestyle  . Physical activity:    Days per week: Not on file    Minutes per  session: Not on file  . Stress: Not on file  Relationships  . Social connections:    Talks on phone: Not on file    Gets together: Not on file    Attends religious service: Not on file    Active member of club or organization: Not on file    Attends meetings of clubs or organizations: Not on file    Relationship status: Not on file  . Intimate partner violence:    Fear of current or ex partner: Not on file    Emotionally abused: Not on file    Physically abused: Not on file    Forced sexual activity: Not on file  Other Topics Concern  . Not on file  Social History Narrative   Drinks about 6-7 caffeine drinks a day      Family History  Problem Relation Age of Onset  . Heart disease Mother   . Heart disease Father   . Colon polyps Brother   . Congestive Heart Failure Brother   . Coronary artery disease Unknown   . Stomach cancer Paternal Grandmother   . Colon cancer Neg Hx   . Pancreatic cancer Neg Hx   . Rectal cancer Neg Hx   . Esophageal cancer Neg Hx      Review of Systems: General: negative for chills, fever, night sweats or weight changes.  Cardiovascular: negative for chest pain, edema, orthopnea, palpitations Dermatological: negative for rash Respiratory: negative for cough or wheezing Urologic: negative for hematuria Abdominal: negative for nausea, vomiting, diarrhea, bright red blood per rectum, melena, or hematemesis Neurologic: negative for visual changes, syncope, or dizziness All other systems reviewed and are otherwise negative except as noted above.    Blood pressure 125/62, pulse 100, height 5\' 11"  (1.803 m), weight 248 lb 3.2 oz  (112.6 kg).  General appearance: alert, cooperative, appears stated age and no distress Lungs: clear to auscultation bilaterally and some stridor noted Heart: regular rate and rhythm Extremities: extremities normal, atraumatic, no cyanosis or edema Skin: Skin color, texture, turgor normal. No rashes or lesions Neurologic: Grossly normal  EKG he was in NSR on 10/09/17 and is in NSR today on exam.   ASSESSMENT AND PLAN:   PAF (paroxysmal atrial fibrillation) (HCC) Recurrent atrial flutter during April hospitalization- Amiodarone was to be stopped but in light of recurrent atrial flutter this was continued and Eliquis added. He was not previously on Eliquis secondary to a history of SDH.   Lung cancer [facility]) Just diagnosed by bronchoscopy- w/u in progress  Nonischemic cardiomyopathy ([place]) EF 50-55% by echo March 2019  CAD (coronary artery disease) Scattered 40% CAD at cath June 2016  Essential hypertension Controlled  DCM (dilated cardiomyopathy) ([place]) EF 20% May 2016, 30% Oct 2016, 40-45% Nov 2017, 50-55% March 2019  Typical atrial flutter [facility]) Atrial flutter March 2019- Amiodarone and Eliquis-  NSR at f/u May 1st 2019   PLAN  Discussed with Dr Gwenlyn Found- for now we'll cut his Amiodarone back to 100 mg daily (no Multaq). He'll resume his Eliquis as prescribed, this can be held 24-48 hrs as needed pre op. From a cardiac standpoint he is cleared for surgery with out further work up. He is scheduled to see Dr Gwenlyn Found in June.   Kerin Ransom PA-C 10/22/2017 4:47 PM

## 2017-10-22 NOTE — Assessment & Plan Note (Signed)
EF 20% May 2016, 30% Oct 2016, 40-45% Nov 2017, 50-55% March 2019

## 2017-10-22 NOTE — Assessment & Plan Note (Signed)
Just diagnosed by bronchoscopy- w/u in progress

## 2017-10-22 NOTE — Patient Instructions (Signed)
Medication Instructions:  DECREASE Pacerone to 100mg  Take 1 tablet once a day  Labwork: None   Testing/Procedures: None   Follow-Up: Keep follow up appointment with Dr Gwenlyn Found as scheduled  Any Other Special Instructions Will Be Listed Below (If Applicable).  If you need a refill on your cardiac medications before your next appointment, please call your pharmacy.

## 2017-10-22 NOTE — Assessment & Plan Note (Signed)
Recurrent atrial flutter during April hospitalization- Amiodarone was to be stopped but in light of recurrent atrial flutter this was continued and Eliquis added. He was not previously on Eliquis secondary to a history of SDH.

## 2017-10-23 ENCOUNTER — Observation Stay (HOSPITAL_COMMUNITY)
Admission: EM | Admit: 2017-10-23 | Discharge: 2017-10-24 | Disposition: A | Discharge status: Home / Self Care | Payer: Medicare Other | Attending: Family Medicine | Admitting: Family Medicine

## 2017-10-23 ENCOUNTER — Emergency Department (HOSPITAL_COMMUNITY): Payer: Medicare Other

## 2017-10-23 ENCOUNTER — Other Ambulatory Visit: Payer: Self-pay

## 2017-10-23 ENCOUNTER — Other Ambulatory Visit: Payer: Self-pay | Admitting: *Deleted

## 2017-10-23 ENCOUNTER — Encounter (HOSPITAL_COMMUNITY): Payer: Self-pay | Admitting: Emergency Medicine

## 2017-10-23 DIAGNOSIS — J910 Malignant pleural effusion: Secondary | ICD-10-CM | POA: Diagnosis not present

## 2017-10-23 DIAGNOSIS — Z86010 Personal history of colonic polyps: Secondary | ICD-10-CM | POA: Diagnosis not present

## 2017-10-23 DIAGNOSIS — Z888 Allergy status to other drugs, medicaments and biological substances status: Secondary | ICD-10-CM | POA: Insufficient documentation

## 2017-10-23 DIAGNOSIS — Z7951 Long term (current) use of inhaled steroids: Secondary | ICD-10-CM | POA: Insufficient documentation

## 2017-10-23 DIAGNOSIS — Z800 Family history of malignant neoplasm of digestive organs: Secondary | ICD-10-CM | POA: Insufficient documentation

## 2017-10-23 DIAGNOSIS — J449 Chronic obstructive pulmonary disease, unspecified: Secondary | ICD-10-CM

## 2017-10-23 DIAGNOSIS — K219 Gastro-esophageal reflux disease without esophagitis: Secondary | ICD-10-CM | POA: Insufficient documentation

## 2017-10-23 DIAGNOSIS — F4310 Post-traumatic stress disorder, unspecified: Secondary | ICD-10-CM | POA: Insufficient documentation

## 2017-10-23 DIAGNOSIS — C3492 Malignant neoplasm of unspecified part of left bronchus or lung: Principal | ICD-10-CM | POA: Insufficient documentation

## 2017-10-23 DIAGNOSIS — I2510 Atherosclerotic heart disease of native coronary artery without angina pectoris: Secondary | ICD-10-CM | POA: Insufficient documentation

## 2017-10-23 DIAGNOSIS — I480 Paroxysmal atrial fibrillation: Secondary | ICD-10-CM | POA: Diagnosis not present

## 2017-10-23 DIAGNOSIS — C3490 Malignant neoplasm of unspecified part of unspecified bronchus or lung: Secondary | ICD-10-CM

## 2017-10-23 DIAGNOSIS — K449 Diaphragmatic hernia without obstruction or gangrene: Secondary | ICD-10-CM | POA: Insufficient documentation

## 2017-10-23 DIAGNOSIS — J90 Pleural effusion, not elsewhere classified: Secondary | ICD-10-CM | POA: Diagnosis present

## 2017-10-23 DIAGNOSIS — I110 Hypertensive heart disease with heart failure: Secondary | ICD-10-CM | POA: Diagnosis not present

## 2017-10-23 DIAGNOSIS — F329 Major depressive disorder, single episode, unspecified: Secondary | ICD-10-CM | POA: Diagnosis not present

## 2017-10-23 DIAGNOSIS — Z7901 Long term (current) use of anticoagulants: Secondary | ICD-10-CM | POA: Diagnosis not present

## 2017-10-23 DIAGNOSIS — R0603 Acute respiratory distress: Secondary | ICD-10-CM

## 2017-10-23 DIAGNOSIS — G894 Chronic pain syndrome: Secondary | ICD-10-CM | POA: Insufficient documentation

## 2017-10-23 DIAGNOSIS — D72829 Elevated white blood cell count, unspecified: Secondary | ICD-10-CM

## 2017-10-23 DIAGNOSIS — R0902 Hypoxemia: Secondary | ICD-10-CM

## 2017-10-23 DIAGNOSIS — Z96612 Presence of left artificial shoulder joint: Secondary | ICD-10-CM | POA: Insufficient documentation

## 2017-10-23 DIAGNOSIS — Z79899 Other long term (current) drug therapy: Secondary | ICD-10-CM | POA: Insufficient documentation

## 2017-10-23 DIAGNOSIS — J9621 Acute and chronic respiratory failure with hypoxia: Secondary | ICD-10-CM | POA: Diagnosis not present

## 2017-10-23 DIAGNOSIS — Z87891 Personal history of nicotine dependence: Secondary | ICD-10-CM | POA: Diagnosis not present

## 2017-10-23 DIAGNOSIS — D869 Sarcoidosis, unspecified: Secondary | ICD-10-CM | POA: Insufficient documentation

## 2017-10-23 DIAGNOSIS — G4733 Obstructive sleep apnea (adult) (pediatric): Secondary | ICD-10-CM | POA: Diagnosis not present

## 2017-10-23 DIAGNOSIS — I420 Dilated cardiomyopathy: Secondary | ICD-10-CM | POA: Diagnosis not present

## 2017-10-23 DIAGNOSIS — Z9981 Dependence on supplemental oxygen: Secondary | ICD-10-CM | POA: Insufficient documentation

## 2017-10-23 DIAGNOSIS — E785 Hyperlipidemia, unspecified: Secondary | ICD-10-CM | POA: Diagnosis not present

## 2017-10-23 DIAGNOSIS — I483 Typical atrial flutter: Secondary | ICD-10-CM | POA: Insufficient documentation

## 2017-10-23 DIAGNOSIS — I5042 Chronic combined systolic (congestive) and diastolic (congestive) heart failure: Secondary | ICD-10-CM | POA: Diagnosis not present

## 2017-10-23 DIAGNOSIS — Z79891 Long term (current) use of opiate analgesic: Secondary | ICD-10-CM | POA: Diagnosis not present

## 2017-10-23 DIAGNOSIS — Z9049 Acquired absence of other specified parts of digestive tract: Secondary | ICD-10-CM | POA: Insufficient documentation

## 2017-10-23 DIAGNOSIS — Z8371 Family history of colonic polyps: Secondary | ICD-10-CM | POA: Insufficient documentation

## 2017-10-23 DIAGNOSIS — Z9889 Other specified postprocedural states: Secondary | ICD-10-CM

## 2017-10-23 LAB — I-STAT CHEM 8, ED
BUN: 13 mg/dL (ref 6–20)
Calcium, Ion: 1.17 mmol/L (ref 1.15–1.40)
Chloride: 96 mmol/L — ABNORMAL LOW (ref 101–111)
Creatinine, Ser: 0.9 mg/dL (ref 0.61–1.24)
Glucose, Bld: 146 mg/dL — ABNORMAL HIGH (ref 65–99)
HEMATOCRIT: 37 % — AB (ref 39.0–52.0)
Hemoglobin: 12.6 g/dL — ABNORMAL LOW (ref 13.0–17.0)
POTASSIUM: 3.6 mmol/L (ref 3.5–5.1)
SODIUM: 133 mmol/L — AB (ref 135–145)
TCO2: 27 mmol/L (ref 22–32)

## 2017-10-23 LAB — CBC WITH DIFFERENTIAL/PLATELET
Basophils Absolute: 0 10*3/uL (ref 0.0–0.1)
Basophils Relative: 0 %
Eosinophils Absolute: 0.2 10*3/uL (ref 0.0–0.7)
Eosinophils Relative: 1 %
HCT: 38.3 % — ABNORMAL LOW (ref 39.0–52.0)
Hemoglobin: 12.7 g/dL — ABNORMAL LOW (ref 13.0–17.0)
Lymphocytes Relative: 8 %
Lymphs Abs: 1.4 10*3/uL (ref 0.7–4.0)
MCH: 27.8 pg (ref 26.0–34.0)
MCHC: 33.2 g/dL (ref 30.0–36.0)
MCV: 83.8 fL (ref 78.0–100.0)
Monocytes Absolute: 0.9 10*3/uL (ref 0.1–1.0)
Monocytes Relative: 5 %
Neutro Abs: 15.6 10*3/uL — ABNORMAL HIGH (ref 1.7–7.7)
Neutrophils Relative %: 86 %
Platelets: 420 10*3/uL — ABNORMAL HIGH (ref 150–400)
RBC: 4.57 MIL/uL (ref 4.22–5.81)
RDW: 14.9 % (ref 11.5–15.5)
WBC: 18.1 10*3/uL — ABNORMAL HIGH (ref 4.0–10.5)

## 2017-10-23 LAB — URINALYSIS, ROUTINE W REFLEX MICROSCOPIC
BACTERIA UA: NONE SEEN
BILIRUBIN URINE: NEGATIVE
Glucose, UA: NEGATIVE mg/dL
Hgb urine dipstick: NEGATIVE
Ketones, ur: NEGATIVE mg/dL
Nitrite: NEGATIVE
Protein, ur: NEGATIVE mg/dL
pH: 6 (ref 5.0–8.0)

## 2017-10-23 LAB — BLOOD GAS, ARTERIAL
Acid-Base Excess: 1.8 mmol/L (ref 0.0–2.0)
Bicarbonate: 24.7 mmol/L (ref 20.0–28.0)
Drawn by: 295031
O2 Content: 3 L/min
O2 Saturation: 96.5 %
Patient temperature: 98.6
pCO2 arterial: 34.4 mmHg (ref 32.0–48.0)
pH, Arterial: 7.47 — ABNORMAL HIGH (ref 7.350–7.450)
pO2, Arterial: 82 mmHg — ABNORMAL LOW (ref 83.0–108.0)

## 2017-10-23 LAB — COMPREHENSIVE METABOLIC PANEL
ALBUMIN: 2.6 g/dL — AB (ref 3.5–5.0)
ALK PHOS: 67 U/L (ref 38–126)
ALT: 13 U/L — AB (ref 17–63)
AST: 13 U/L — AB (ref 15–41)
Anion gap: 12 (ref 5–15)
BILIRUBIN TOTAL: 0.8 mg/dL (ref 0.3–1.2)
BUN: 14 mg/dL (ref 6–20)
CALCIUM: 9.2 mg/dL (ref 8.9–10.3)
CO2: 25 mmol/L (ref 22–32)
CREATININE: 0.99 mg/dL (ref 0.61–1.24)
Chloride: 99 mmol/L — ABNORMAL LOW (ref 101–111)
GFR calc Af Amer: >60 mL/min (ref >60)
GFR calc non Af Amer: >60 mL/min (ref >60)
GLUCOSE: 171 mg/dL — AB (ref 65–99)
Potassium: 3.8 mmol/L (ref 3.5–5.1)
SODIUM: 136 mmol/L (ref 135–145)
TOTAL PROTEIN: 6.4 g/dL — AB (ref 6.5–8.1)

## 2017-10-23 LAB — I-STAT CG4 LACTIC ACID, ED
LACTIC ACID, VENOUS: 2.13 mmol/L — AB (ref 0.5–1.9)
Lactic Acid, Venous: 1.55 mmol/L (ref 0.5–1.9)

## 2017-10-23 LAB — I-STAT TROPONIN, ED: TROPONIN I, POC: 0 ng/mL (ref 0.00–0.08)

## 2017-10-23 LAB — PROTIME-INR
INR: 1
PROTHROMBIN TIME: 13.1 s (ref 11.4–15.2)

## 2017-10-23 LAB — BRAIN NATRIURETIC PEPTIDE: B Natriuretic Peptide: 147.5 pg/mL — ABNORMAL HIGH (ref 0.0–100.0)

## 2017-10-23 MED ORDER — FINASTERIDE 5 MG PO TABS
5.0000 mg | ORAL_TABLET | Freq: Every day | ORAL | Status: DC
  Administered 2017-10-23 – 2017-10-24 (×2): 5 mg via ORAL
  Filled 2017-10-23 (×2): qty 1

## 2017-10-23 MED ORDER — METOPROLOL TARTRATE 25 MG PO TABS
25.0000 mg | ORAL_TABLET | Freq: Two times a day (BID) | ORAL | Status: DC
  Administered 2017-10-23 – 2017-10-24 (×2): 25 mg via ORAL
  Filled 2017-10-23 (×2): qty 1

## 2017-10-23 MED ORDER — BISACODYL 5 MG PO TBEC: 5.0000 mg | DELAYED_RELEASE_TABLET | Freq: Every day | ORAL | Status: DC | PRN

## 2017-10-23 MED ORDER — GUAIFENESIN ER 600 MG PO TB12
600.0000 mg | ORAL_TABLET | Freq: Two times a day (BID) | ORAL | Status: DC
  Administered 2017-10-23 – 2017-10-24 (×2): 600 mg via ORAL
  Filled 2017-10-23 (×2): qty 1

## 2017-10-23 MED ORDER — FLUOXETINE HCL 20 MG PO CAPS
40.0000 mg | ORAL_CAPSULE | Freq: Every day | ORAL | Status: DC
  Administered 2017-10-24: 40 mg via ORAL
  Filled 2017-10-23: qty 2

## 2017-10-23 MED ORDER — MORPHINE SULFATE (PF) 2 MG/ML IV SOLN
2.0000 mg | Freq: Once | INTRAVENOUS | Status: AC
  Administered 2017-10-23: 2 mg via INTRAVENOUS
  Filled 2017-10-23: qty 1

## 2017-10-23 MED ORDER — IOPAMIDOL (ISOVUE-370) INJECTION 76%
100.0000 mL | Freq: Once | INTRAVENOUS | Status: AC | PRN
  Administered 2017-10-23: 100 mL via INTRAVENOUS

## 2017-10-23 MED ORDER — ONDANSETRON HCL 4 MG PO TABS: 4.0000 mg | ORAL_TABLET | Freq: Four times a day (QID) | ORAL | Status: DC | PRN

## 2017-10-23 MED ORDER — SACUBITRIL-VALSARTAN 24-26 MG PO TABS
1.0000 | ORAL_TABLET | Freq: Two times a day (BID) | ORAL | Status: DC
  Administered 2017-10-23 – 2017-10-24 (×2): 1 via ORAL
  Filled 2017-10-23 (×2): qty 1

## 2017-10-23 MED ORDER — IPRATROPIUM-ALBUTEROL 0.5-2.5 (3) MG/3ML IN SOLN
3.0000 mL | Freq: Four times a day (QID) | RESPIRATORY_TRACT | Status: DC
  Administered 2017-10-24 (×3): 3 mL via RESPIRATORY_TRACT
  Filled 2017-10-23 (×3): qty 3

## 2017-10-23 MED ORDER — SODIUM CHLORIDE 0.9 % IV BOLUS (SEPSIS)
1000.0000 mL | Freq: Once | INTRAVENOUS | Status: AC
  Administered 2017-10-23: 1000 mL via INTRAVENOUS

## 2017-10-23 MED ORDER — IPRATROPIUM-ALBUTEROL 0.5-2.5 (3) MG/3ML IN SOLN
3.0000 mL | RESPIRATORY_TRACT | Status: DC | PRN
  Administered 2017-10-23: 3 mL via RESPIRATORY_TRACT
  Filled 2017-10-23: qty 3

## 2017-10-23 MED ORDER — BENZONATATE 100 MG PO CAPS
200.0000 mg | ORAL_CAPSULE | Freq: Three times a day (TID) | ORAL | Status: DC | PRN
  Administered 2017-10-23: 200 mg via ORAL
  Filled 2017-10-23: qty 2

## 2017-10-23 MED ORDER — MOMETASONE FURO-FORMOTEROL FUM 200-5 MCG/ACT IN AERO
2.0000 | INHALATION_SPRAY | Freq: Two times a day (BID) | RESPIRATORY_TRACT | Status: DC
  Filled 2017-10-23: qty 8.8

## 2017-10-23 MED ORDER — DULOXETINE HCL 30 MG PO CPEP
30.0000 mg | ORAL_CAPSULE | Freq: Every day | ORAL | Status: DC
  Administered 2017-10-24: 30 mg via ORAL
  Filled 2017-10-23: qty 1

## 2017-10-23 MED ORDER — SODIUM CHLORIDE 0.9 % IV BOLUS (SEPSIS)
250.0000 mL | Freq: Once | INTRAVENOUS | Status: AC
  Administered 2017-10-23: 250 mL via INTRAVENOUS

## 2017-10-23 MED ORDER — SODIUM CHLORIDE 0.9 % IV SOLN
500.0000 mg | INTRAVENOUS | Status: DC
  Administered 2017-10-23: 500 mg via INTRAVENOUS
  Filled 2017-10-23: qty 500

## 2017-10-23 MED ORDER — AMIODARONE HCL 100 MG PO TABS
100.0000 mg | ORAL_TABLET | Freq: Every day | ORAL | Status: DC
  Administered 2017-10-23 – 2017-10-24 (×2): 100 mg via ORAL
  Filled 2017-10-23 (×3): qty 1

## 2017-10-23 MED ORDER — ONDANSETRON HCL 4 MG/2ML IJ SOLN: 4.0000 mg | Freq: Four times a day (QID) | INTRAMUSCULAR | Status: DC | PRN

## 2017-10-23 MED ORDER — IOPAMIDOL (ISOVUE-370) INJECTION 76%
INTRAVENOUS | Status: AC
  Filled 2017-10-23: qty 100

## 2017-10-23 MED ORDER — SODIUM CHLORIDE 0.9 % IV SOLN
2.0000 g | INTRAVENOUS | Status: DC
  Administered 2017-10-23: 2 g via INTRAVENOUS
  Filled 2017-10-23: qty 20

## 2017-10-23 MED ORDER — FUROSEMIDE 20 MG PO TABS
20.0000 mg | ORAL_TABLET | Freq: Two times a day (BID) | ORAL | Status: DC
  Administered 2017-10-24: 20 mg via ORAL
  Filled 2017-10-23: qty 1

## 2017-10-23 MED ORDER — ATORVASTATIN CALCIUM 20 MG PO TABS
20.0000 mg | ORAL_TABLET | Freq: Every day | ORAL | Status: DC
  Administered 2017-10-23: 20 mg via ORAL
  Filled 2017-10-23: qty 1

## 2017-10-23 MED ORDER — HYDROCODONE-ACETAMINOPHEN 5-325 MG PO TABS
1.0000 | ORAL_TABLET | Freq: Three times a day (TID) | ORAL | Status: DC | PRN
  Administered 2017-10-23 – 2017-10-24 (×3): 1 via ORAL
  Filled 2017-10-23 (×3): qty 1

## 2017-10-23 MED ORDER — SODIUM CHLORIDE 0.9 % IV BOLUS: 1000.0000 mL | Freq: Once | INTRAVENOUS | Status: DC

## 2017-10-23 NOTE — ED Triage Notes (Signed)
Per EMS, pt recently dx with lung cancer and has not received treatment. SOB today. Denies pain. 5 mg albuterol in route. 89% RA. Lung sounds absent with EMS. Heavy dry cough present.

## 2017-10-23 NOTE — ED Notes (Signed)
Bed: WA03 Expected date:  Expected time:  Means of arrival:  Comments: EMS-SOB

## 2017-10-23 NOTE — H&P (Signed)
History and Physical    Travis Ferguson JJK:[id number] DOB: 1940-12-16  DOA: 10/23/2017 PCP: Crist Infante, MD  Patient coming from: Home   Chief Complaint: SOB   HPI: Travis Ferguson is a 77 y.o. male with medical history significant of just diagnosed with NSCLC by Dr. Halford Chessman 2 days ago, COPD, OSA, sarcoidosis, chronic pain syndrome who presented to the emergency department complaining of acute onset on worsening shortness of breath.  Patient reported that this morning he had a coughing spell, subsequently developed difficulty breathing and unable to ambulate without getting short of breath. Cough is productive with white sputum patient report having chills over the past 2 days but no fevers.  No other associated symptoms.  Of note patient had bronchoscopy done 4/26 shows poorly differentiated NSCLC, treatment has not been discussed yet.  ED Course: Patient found to be hypoxic to 89% on room air, WBC elevated at 18.1, lactic acid mildly elevated 2.13, CTA chest shows progression of left hilar lung carcinoma with mediastinal extension, increased size of left pleural effusion, tachycardic and afebrile. Code sepsis was called, patient received 2.5L   Review of Systems:  Patient denies of headache, blurry vision, chest pain, palpitations, abdominal pain, nausea, vomiting, difficulty with urination and weakness.  Review of system otherwise negative, except as mentioned above.   Past Medical History:  Diagnosis Date  . Acute on chronic combined systolic and diastolic CHF (congestive heart failure) ([place]) 02/24/2015  . Anxiety   . Arthritis    "about q joint I've got" (09/22/2015)  . Atherosclerosis   . CAP (community acquired pneumonia) 02/24/2015  . Cervical disc disease   . Chronic lower back pain    chronic back pain/under pain management  . Colon polyps 08/02/2014   Tubular adenoma x 3, Hyperplastic-1  . COPD (chronic obstructive pulmonary disease) ([place])    a. Former Airline pilot, also smoked a  pipe.  . Coronary artery calcification seen on CAT scan   . Depression   . Dyslipidemia   . Dyspnea    chronic  . GERD (gastroesophageal reflux disease)   . Hiatal hernia    sensation problem ("right lateral side hip to knee").  . History of blood transfusion "numerous"  . History of oxygen administration    @ 2 l/m nasally at bedtime.(09/22/2015)  . Hypertension   . Inguinal hernia    right side  . Migraine    "years since I've had one" (09/22/2015)  . OSA (obstructive sleep apnea)    does not use CPAP; "just oxygen" (09/22/2015)  . PTSD (post-traumatic stress disorder)   . Rhinitis   . Sarcoidosis   . Sensation problem    right side-lateral hip to knee" decreased sensation and tingling feeling" -has informed Dr. Joylene Draft, Dr. Henrene Pastor, Dr. Lucia Gaskins of this.  . Sepsis ([place])   . Steroid-induced hyperglycemia 02/25/2015  . Subdural hematoma ([place]) 11/10   a. 2010 s/p surgery - diagnosed several weeks after a fall.    Past Surgical History:  Procedure Laterality Date  . BACK SURGERY    . BRAIN SURGERY  04/2009   right frontal"hemorrhage evacuation from fall injury"  . CARDIAC CATHETERIZATION N/A 01/23/2015   Procedure: Right/Left Heart Cath and Coronary Angiography;  Surgeon: Lorretta Harp, MD;  Location: [facility];  Service: Cardiovascular;  Laterality: N/A;  . CHOLECYSTECTOMY N/A 11/08/2014   Procedure: LAPAROSCOPIC CHOLECYSTECTOMY ;  Surgeon: Alphonsa Overall, MD;  Location: [facility];  Service: General;  Laterality: N/A;  . ERCP N/A  11/07/2014   Procedure: ENDOSCOPIC RETROGRADE CHOLANGIOPANCREATOGRAPHY (ERCP);  Surgeon: Irene Shipper, MD;  Location: [facility];  Service: Endoscopy;  Laterality: N/A;  . GALLBLADDER SURGERY  09/2014   drain tube placed  . INGUINAL HERNIA REPAIR Left   . KNEE ARTHROSCOPY Right   . LUMBAR LAMINECTOMY/DECOMPRESSION MICRODISCECTOMY Right 12/15/2012   Procedure: LUMBAR LAMINECTOMY/DECOMPRESSION MICRODISCECTOMY 1 LEVEL;  Surgeon: Elaina Hoops, MD;   Location: [facility];  Service: Neurosurgery;  Laterality: Right;  Right Lumbar four-five laminectomy/foraminotomy  . REVERSE SHOULDER ARTHROPLASTY Left 09/22/2015   Procedure: LEFT REVERSE TOTAL SHOULDER ARTHROPLASTY;  Surgeon: Netta Cedars, MD;  Location: [facility];  Service: Orthopedics;  Laterality: Left;  . REVERSE TOTAL SHOULDER ARTHROPLASTY Left 09/22/2015  . VIDEO BRONCHOSCOPY  07/28/2012   Procedure: VIDEO BRONCHOSCOPY WITH FLUORO;  Surgeon: Chesley Mires, MD;  Location: [facility];  Service: Cardiopulmonary;  Laterality: Bilateral;  . VIDEO BRONCHOSCOPY Bilateral 10/17/2017   Procedure: VIDEO BRONCHOSCOPY WITH FLUORO;  Surgeon: Chesley Mires, MD;  Location: [facility];  Service: Cardiopulmonary;  Laterality: Bilateral;   Social Hx:    reports that he quit smoking about 19 years ago. His smoking use included pipe. He smoked 0.00 packs per day for 30.00 years. He has never used smokeless tobacco. He reports that he does not drink alcohol or use drugs.  Allergies  Allergen Reactions  . Statins Other (See Comments)    Muscle aches - tolerating Vytorin    Family History  Problem Relation Age of Onset  . Heart disease Mother   . Heart disease Father   . Colon polyps Brother   . Congestive Heart Failure Brother   . Coronary artery disease Unknown   . Stomach cancer Paternal Grandmother   . Colon cancer Neg Hx   . Pancreatic cancer Neg Hx   . Rectal cancer Neg Hx   . Esophageal cancer Neg Hx     Prior to Admission medications   Medication Sig Start Date End Date Taking? Authorizing Provider  acetaminophen (TYLENOL) 325 MG tablet Take 650 mg by mouth every 6 (six) hours as needed for mild pain.   Yes [provider]  albuterol (PROVENTIL HFA;VENTOLIN HFA) 108 (90 Base) MCG/ACT inhaler 1-2 PUFFS EVERY 4-6 HOURS AS NEEDED FOR SHORTNESS OF BREATH 10/20/17  Yes Chesley Mires, MD  amiodarone (PACERONE) 100 MG tablet Take 1 tablet (100 mg total) by mouth daily. 10/22/17  Yes Kilroy,  Doreene Burke, PA-C  apixaban (ELIQUIS) 5 MG TABS tablet Take 1 tablet (5 mg total) by mouth 2 (two) times daily. 10/13/17  Yes Florencia Reasons, MD  atorvastatin (LIPITOR) 20 MG tablet Take 1 tablet (20 mg total) by mouth daily at 6 PM. 09/08/17  Yes Dessa Phi, DO  benzonatate (TESSALON) 200 MG capsule Take 1 capsule (200 mg total) by mouth 3 (three) times daily as needed for cough. 09/22/17  Yes Chesley Mires, MD  budesonide-formoterol (SYMBICORT) 160-4.5 MCG/ACT inhaler Inhale 2 puffs into the lungs 2 (two) times daily. 07/15/17  Yes Parrett, Tammy S, NP  chlorhexidine (PERIDEX) 0.12 % solution chlorhexidine gluconate 0.12 % mouthwash   Yes [provider]  DULoxetine (CYMBALTA) 30 MG capsule Take 30 mg by mouth daily.   Yes [provider]  ENTRESTO 24-26 MG TAKE 1 TABLET BY MOUTH 2 (TWO) TIMES DAILY. 05/14/16  Yes Lorretta Harp, MD  finasteride (PROSCAR) 5 MG tablet Take 5 mg by mouth daily.  01/07/13  Yes [provider]  FLUoxetine (PROZAC) 40 MG capsule Take 40  mg by mouth daily with breakfast.    Yes [provider]  furosemide (LASIX) 20 MG tablet Take 1 tablet (20 mg total) by mouth 2 (two) times daily. 09/08/17  Yes Dessa Phi, DO  guaiFENesin (MUCINEX) 600 MG 12 hr tablet Take 1 tablet (600 mg total) by mouth 2 (two) times daily. 10/13/17 10/13/18 Yes Florencia Reasons, MD  HYDROcodone-acetaminophen (NORCO/VICODIN) 5-325 MG tablet Take 1 tablet by mouth every 8 (eight) hours as needed for moderate pain.    Yes [provider]  metoprolol tartrate (LOPRESSOR) 25 MG tablet TAKE 1 TABLET (25 MG TOTAL) BY MOUTH 2 (TWO) TIMES DAILY. 09/11/15  Yes Lorretta Harp, MD  OXYGEN Inhale 2 L/min into the lungs at bedtime.   Yes [provider]  sodium chloride (OCEAN) 0.65 % SOLN nasal spray Place 1-2 sprays into both nostrils daily as needed for congestion.   Yes [provider]  ipratropium (ATROVENT) 0.02 % nebulizer solution Take 2.5 mLs (0.5 mg total)  by nebulization 4 (four) times daily. Patient not taking: Reported on 10/23/2017 07/15/17   Parrett, Fonnie Mu, NP  levalbuterol (XOPENEX) 1.25 MG/0.5ML nebulizer solution Take 1.25 mg by nebulization every 8 (eight) hours as needed for wheezing or shortness of breath. Patient not taking: Reported on 10/23/2017 10/13/17   Florencia Reasons, MD  saccharomyces boulardii (FLORASTOR) 250 MG capsule Take 1 capsule (250 mg total) by mouth 2 (two) times daily for 10 days. Patient not taking: Reported on 10/23/2017 10/13/17 10/23/17  Florencia Reasons, MD  sodium chloride 0.9 % nebulizer solution Use to dilute Xopenex neb solution Patient not taking: Reported on 10/23/2017 10/16/17   Chesley Mires, MD    Physical Exam: Vitals:   10/23/17 1351 10/23/17 1400 10/23/17 1734  BP: 101/71 101/71 99/78  Pulse: (!) 145 (!) 142 (!) 116  Resp: (!) 25 (!) 42 (!) 23  Temp: 97.8 F (36.6 C)    SpO2: 100% 100% 100%     Constitutional: Mild distress, anxious Eyes: PERRL, lids and conjunctivae normal ENMT: Mucous membranes are moist. Posterior pharynx clear of any exudate or lesions Neck: normal, supple, no masses, no thyromegaly Respiratory: Decreased breath sounds on the left side up to mid field, good air entry in the right.  Labored breathing Cardiovascular: RRR, S1-S2, no JVD, no murmurs Abdomen: no tenderness, no masses palpated. No hepatosplenomegaly. Bowel sounds positive.  Musculoskeletal: no clubbing / cyanosis. No joint deformity upper and lower extremities.  Skin: no rashes, lesions, ulcers. Neurologic: CN 2-12 grossly intact. Sensation intact,. Strength 5/5 in all 4.  Psychiatric: Normal judgment and insight. Alert and oriented x 3.  Labs on Admission: I have personally reviewed following labs and imaging studies  CBC: Recent Labs  Lab 10/23/17 1513 10/23/17 1526  WBC 18.1*  --   NEUTROABS 15.6*  --   HGB 12.7* 12.6*  HCT 38.3* 37.0*  MCV 83.8  --   PLT 420*  --    Basic Metabolic Panel: Recent Labs  Lab  10/23/17 1513 10/23/17 1526  NA 136 133*  K 3.8 3.6  CL 99* 96*  CO2 25  --   GLUCOSE 171* 146*  BUN 14 13  CREATININE 0.99 0.90  CALCIUM 9.2  --    GFR: Estimated Creatinine Clearance: 87.7 mL/min (by C-G formula based on SCr of 0.9 mg/dL). Liver Function Tests: Recent Labs  Lab 10/23/17 1513  AST 13*  ALT 13*  ALKPHOS 67  BILITOT 0.8  PROT 6.4*  ALBUMIN 2.6*  No results for input(s): LIPASE, AMYLASE in the last 168 hours. No results for input(s): AMMONIA in the last 168 hours. Coagulation Profile: Recent Labs  Lab 10/23/17 1513  INR 1.00   Cardiac Enzymes: No results for input(s): CKTOTAL, CKMB, CKMBINDEX, TROPONINI in the last 168 hours. BNP (last 3 results) Recent Labs    08/08/17 0958  PROBNP 92.0   HbA1C: No results for input(s): HGBA1C in the last 72 hours. CBG: No results for input(s): GLUCAP in the last 168 hours. Lipid Profile: No results for input(s): CHOL, HDL, LDLCALC, TRIG, CHOLHDL, LDLDIRECT in the last 72 hours. Thyroid Function Tests: No results for input(s): TSH, T4TOTAL, FREET4, T3FREE, THYROIDAB in the last 72 hours. Anemia Panel: No results for input(s): VITAMINB12, FOLATE, FERRITIN, TIBC, IRON, RETICCTPCT in the last 72 hours. Urine analysis:    Component Value Date/Time   COLORURINE YELLOW 09/02/2017 [place] 09/02/2017 1716   LABSPEC >1.046 (H) 09/02/2017 1716   PHURINE 5.0 09/02/2017 1716   GLUCOSEU NEGATIVE 09/02/2017 1716   HGBUR NEGATIVE 09/02/2017 1716   BILIRUBINUR NEGATIVE 09/02/2017 1716   KETONESUR NEGATIVE 09/02/2017 1716   PROTEINUR NEGATIVE 09/02/2017 1716   UROBILINOGEN 0.2 08/19/2014 1909   NITRITE NEGATIVE 09/02/2017 1716   LEUKOCYTESUR NEGATIVE 09/02/2017 1716   Sepsis Labs: !!!!!!!!!!!!!!!!!!!!!!!!!!!!!!!!!!!!!!!!!!!! @LABRCNTIP (procalcitonin:4,lacticidven:4) )No results found for this or any previous visit (from the past 240 hour(s)).   Radiological Exams on Admission: Dg Chest 2  View  Result Date: 10/23/2017 CLINICAL DATA:  Shortness of breath. Recently diagnosed with lung cancer. EXAM: CHEST - 2 VIEW COMPARISON:  10/17/2017 and 10/10/2017 CT FINDINGS: There is significant opacity in the LEFT mid lung zone and LEFT lung base, increased compared with most recent comparison. RIGHT lung is unremarkable. LEFT pleural effusion. Degenerative changes are seen in the thoracic spine. IMPRESSION: Progressive opacity in the LEFT lung, consistent with infectious process/atelectasis, tumor progression, or pleural effusion. Electronically Signed   By: Nolon Nations M.D.   On: 10/23/2017 15:06   Ct Angio Chest Pe W And/or Wo Contrast  Result Date: 10/23/2017 CLINICAL DATA:  History of lung carcinoma, short of breath today, lung sounds absent EXAM: CT ANGIOGRAPHY CHEST WITH CONTRAST TECHNIQUE: Multidetector CT imaging of the chest was performed using the standard protocol during bolus administration of intravenous contrast. Multiplanar CT image reconstructions and MIPs were obtained to evaluate the vascular anatomy. CONTRAST:  125mL ISOVUE-370 IOPAMIDOL (ISOVUE-370) INJECTION 76% COMPARISON:  Chest x-ray of 10/23/2016 and CT chest 10/10/2017 FINDINGS: Cardiovascular: Moderate cardiomegaly is stable. Diffuse coronary artery calcifications are noted. No pericardial effusion is seen. The thoracic aorta opacifies with no acute abnormality. The pulmonary arteries are well opacified and there is no evidence of acute pulmonary embolism. Mediastinum/Nodes: The previously noted left hilar mass appears to extend into the mediastinum compressing the left pulmonary vein consistent with progressive lung carcinoma. This mass on image 152 measures 68 x 101 mm compared to 72 x 81 mm previously. Pulmonary artery branches do course through this soft tissue mass without evidence of pulmonary embolism. On image 120 series 5 there is a left hilar node indenting the left main pulmonary artery measuring 17 mm in diameter.  Anterior mediastinal nodes also are present which appear larger. On image 102 an anterior mediastinal prevascular node measures 14 mm compared to 11 mm previously. These nodes are worrisome for metastatic involvement of mediastinal and hilar lymph nodes. There also is a cluster of nodes precarinal to the right of midline measuring 18 mm, much larger  than on the prior CT Lungs/Pleura: The volume of the moderate to large left pleural effusion has increased considerably in the interval resulting in more volume loss of the left upper lobe and left lower lobe. There is truncation of the bronchus to the left upper lobe with some air reaching the lingula and a portion of the left lower lobe. The right lung appears relatively well aerated. No definite right effusion is seen. Upper Abdomen: The liver is unremarkable with no evidence of a hepatic metastases on this exam. Surgical clips are noted from prior cholecystectomy. The pancreas is unremarkable although somewhat fatty infiltrated. Splenic artery calcifications are present. Musculoskeletal: There are degenerative changes throughout the thoracic spine. Mild compression deformities of several adjacent midthoracic vertebra appear stable. Review of the MIP images confirms the above findings. IMPRESSION: 1. 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 2. Progression of adenopathy involving the left hilum and mediastinum. 3. Increasing size of left pleural effusion. 4. No acute pulmonary embolism is seen. Electronically Signed   By: Ivar Drape M.D.   On: 10/23/2017 16:45    EKG: Ordered  Assessment/Plan Acute on chronic hypoxic respiratory failure due to malignant pleural effusion CT shows increase in left-sided pleural effusion. Will admit to telemetry Stat thoracentesis, patient with labored breathing and uncomfortable Need to discuss with PCCM/oncology if patient might need pigtail while waiting for cancer treatment. Continue oxygen  supplementation vaccinated Continue supportive treatment  Malignant pleural effusion Recently diagnosed with NSCLC, not on therapy yet. Patient was referred to oncology by Dr. Halford Chessman  Leukocytosis  Likely from malignancy, CT does not show any signs of PNA, Lactic acidosis likely from hypoxia. Patient received Rocephin and Azithro, will hold on abx for now.  Procalcitonin will not be helpful as patient has diagnosis of malignancy.  Observe for now, if spike fever send blood cultures and start empiric antibiotic  COPD/OSA No signs of exacerbation at this time, respiratory issues likely from pleural effusion Continue albuterol and Symbicort Continue oxygen supplementation CPAP at night  Combined CHF No signs of fluid overload, pleural effusion due to malignancy Continue Entresto, Lopressor and Lasix Monitor I and O's and daily weight  PAF  Patient initially tachycardic, responded well to IV fluids Continue Lopressor, supposed to be Eliquis but this was held due to prior bronchoscopy.  Will continue to hold for now as patient needs thoracentesis. Continue amiodarone Monitor on telemetry  DVT prophylaxis: SCD's  Code Status: DNR, family present and confirm  Family Communication: Son at bedside  Disposition Plan: Anticipate discharge to previous home environment.  Consults called: IR for thoracentesis  Admission status: Obs/Tele   Chipper Oman MD [facility] Pager: Text Page via [email or web address]  [phone]  If 7PM-7AM, please contact night-coverage [email or web address] Password [facility]  10/23/2017, 5:58 PM

## 2017-10-23 NOTE — Progress Notes (Addendum)
Pt refused CPAP qhs.  Pt prefers to wear nasal cannula at 2Lpm while sleeping.  Pt encouraged to contact RT should he change his mind.

## 2017-10-23 NOTE — ED Provider Notes (Signed)
[facility] DEPT Provider Note  CSN: [id number] Arrival date & time: 10/23/17 1341  Chief Complaint(s) Shortness of Breath  HPI Travis Ferguson is a 77 y.o. male with a history of heart failure and recent diagnosis of lung cancer that was complicated by pleural effusion requiring thoracentesis presents to the emergency department with sudden onset of shortness of breath after a coughing fit that began 1 to 2 hours prior to arrival. Shortness of breath is exacerbated with ambulation and coughing.  Alleviated by being still.  Patient denies any fevers or chills.  No associated nausea or vomiting.  Denies any chest pain.  No abdominal pain.    HPI  Past Medical History Past Medical History:  Diagnosis Date  . Acute on chronic combined systolic and diastolic CHF (congestive heart failure) ([place]) 02/24/2015  . Anxiety   . Arthritis    "about q joint I've got" (09/22/2015)  . Atherosclerosis   . CAP (community acquired pneumonia) 02/24/2015  . Cervical disc disease   . Chronic lower back pain    chronic back pain/under pain management  . Colon polyps 08/02/2014   Tubular adenoma x 3, Hyperplastic-1  . COPD (chronic obstructive pulmonary disease) ([place])    a. Former Airline pilot, also smoked a pipe.  . Coronary artery calcification seen on CAT scan   . Depression   . Dyslipidemia   . Dyspnea    chronic  . GERD (gastroesophageal reflux disease)   . Hiatal hernia    sensation problem ("right lateral side hip to knee").  . History of blood transfusion "numerous"  . History of oxygen administration    @ 2 l/m nasally at bedtime.(09/22/2015)  . Hypertension   . Inguinal hernia    right side  . Migraine    "years since I've had one" (09/22/2015)  . OSA (obstructive sleep apnea)    does not use CPAP; "just oxygen" (09/22/2015)  . PTSD (post-traumatic stress disorder)   . Rhinitis   . Sarcoidosis   . Sensation problem    right side-lateral hip to knee" decreased  sensation and tingling feeling" -has informed Dr. Joylene Draft, Dr. Henrene Pastor, Dr. Lucia Gaskins of this.  . Sepsis ([place])   . Steroid-induced hyperglycemia 02/25/2015  . Subdural hematoma ([place]) 11/10   a. 2010 s/p surgery - diagnosed several weeks after a fall.   Patient Active Problem List   Diagnosis Date Noted  . Lung cancer ([place]) 10/22/2017  . CAD (coronary artery disease) 10/22/2017  . PAF (paroxysmal atrial fibrillation) ([place])   . DCM (dilated cardiomyopathy) ([place])   . Hypoxemia   . Lung mass   . HCAP (healthcare-associated pneumonia) 10/09/2017  . S/P thoracentesis   . Sepsis ([place]) 09/02/2017  . Tachycardia 09/02/2017  . Left lower lobe pneumonia ([place]) 09/02/2017  . Chronic pain syndrome 09/02/2017  . Typical atrial flutter ([place])   . Nonischemic cardiomyopathy ([place])   . S/P shoulder replacement 09/22/2015  . Acute on chronic respiratory failure with hypoxia ([place]) 02/27/2015  . Dyspnea   . Acute on chronic combined systolic and diastolic CHF (congestive heart failure) ([place]) 02/24/2015  . CAP (community acquired pneumonia) 02/24/2015  . COPD with acute exacerbation ([place]) 02/24/2015  . Left ventricular dysfunction 01/20/2015  . S/P laparoscopic cholecystectomy 12/27/2014  . Other emphysema ([place])   . Pleural effusion 03/17/2014  . Splenic artery aneurysm ([place]) 04/27/2013  . Coronary artery calcification seen on CAT scan 04/26/2013  . Essential hypertension 04/26/2013  . Hyperlipidemia 04/26/2013  . Post-nasal drip  03/15/2013  . 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 Obstructive sleep apnea 05/11/2007  . COPD with chronic bronchitis ([place]) 05/11/2007   Home Medication(s) Prior to Admission medications   Medication Sig Start Date End Date Taking? Authorizing Provider  acetaminophen (TYLENOL) 325 MG tablet Take 650 mg by mouth every 6 (six) hours as needed for mild pain.   Yes [provider]  albuterol (PROVENTIL HFA;VENTOLIN HFA) 108 (90 Base) MCG/ACT inhaler 1-2 PUFFS EVERY  4-6 HOURS AS NEEDED FOR SHORTNESS OF BREATH 10/20/17  Yes Chesley Mires, MD  amiodarone (PACERONE) 100 MG tablet Take 1 tablet (100 mg total) by mouth daily. 10/22/17  Yes Kilroy, Doreene Burke, PA-C  apixaban (ELIQUIS) 5 MG TABS tablet Take 1 tablet (5 mg total) by mouth 2 (two) times daily. 10/13/17  Yes Florencia Reasons, MD  atorvastatin (LIPITOR) 20 MG tablet Take 1 tablet (20 mg total) by mouth daily at 6 PM. 09/08/17  Yes Dessa Phi, DO  benzonatate (TESSALON) 200 MG capsule Take 1 capsule (200 mg total) by mouth 3 (three) times daily as needed for cough. 09/22/17  Yes Chesley Mires, MD  budesonide-formoterol (SYMBICORT) 160-4.5 MCG/ACT inhaler Inhale 2 puffs into the lungs 2 (two) times daily. 07/15/17  Yes Parrett, Tammy S, NP  chlorhexidine (PERIDEX) 0.12 % solution chlorhexidine gluconate 0.12 % mouthwash   Yes [provider]  DULoxetine (CYMBALTA) 30 MG capsule Take 30 mg by mouth daily.   Yes [provider]  ENTRESTO 24-26 MG TAKE 1 TABLET BY MOUTH 2 (TWO) TIMES DAILY. 05/14/16  Yes Lorretta Harp, MD  finasteride (PROSCAR) 5 MG tablet Take 5 mg by mouth daily.  01/07/13  Yes [provider]  FLUoxetine (PROZAC) 40 MG capsule Take 40 mg by mouth daily with breakfast.    Yes [provider]  furosemide (LASIX) 20 MG tablet Take 1 tablet (20 mg total) by mouth 2 (two) times daily. 09/08/17  Yes Dessa Phi, DO  guaiFENesin (MUCINEX) 600 MG 12 hr tablet Take 1 tablet (600 mg total) by mouth 2 (two) times daily. 10/13/17 10/13/18 Yes Florencia Reasons, MD  HYDROcodone-acetaminophen (NORCO/VICODIN) 5-325 MG tablet Take 1 tablet by mouth every 8 (eight) hours as needed for moderate pain.    Yes [provider]  metoprolol tartrate (LOPRESSOR) 25 MG tablet TAKE 1 TABLET (25 MG TOTAL) BY MOUTH 2 (TWO) TIMES DAILY. 09/11/15  Yes Lorretta Harp, MD  OXYGEN Inhale 2 L/min into the lungs at bedtime.   Yes [provider]  sodium chloride (OCEAN) 0.65 % SOLN nasal spray  Place 1-2 sprays into both nostrils daily as needed for congestion.   Yes [provider]  ipratropium (ATROVENT) 0.02 % nebulizer solution Take 2.5 mLs (0.5 mg total) by nebulization 4 (four) times daily. Patient not taking: Reported on 10/23/2017 07/15/17   Parrett, Fonnie Mu, NP  levalbuterol (XOPENEX) 1.25 MG/0.5ML nebulizer solution Take 1.25 mg by nebulization every 8 (eight) hours as needed for wheezing or shortness of breath. Patient not taking: Reported on 10/23/2017 10/13/17   Florencia Reasons, MD  saccharomyces boulardii (FLORASTOR) 250 MG capsule Take 1 capsule (250 mg total) by mouth 2 (two) times daily for 10 days. Patient not taking: Reported on 10/23/2017 10/13/17 10/23/17  Florencia Reasons, MD  sodium chloride 0.9 % nebulizer solution Use to dilute Xopenex neb solution Patient not taking: Reported on 10/23/2017 10/16/17   Chesley Mires, MD  Past Surgical History Past Surgical History:  Procedure Laterality Date  . BACK SURGERY    . BRAIN SURGERY  04/2009   right frontal"hemorrhage evacuation from fall injury"  . CARDIAC CATHETERIZATION N/A 01/23/2015   Procedure: Right/Left Heart Cath and Coronary Angiography;  Surgeon: Lorretta Harp, MD;  Location: [facility];  Service: Cardiovascular;  Laterality: N/A;  . CHOLECYSTECTOMY N/A 11/08/2014   Procedure: LAPAROSCOPIC CHOLECYSTECTOMY ;  Surgeon: Alphonsa Overall, MD;  Location: [facility];  Service: General;  Laterality: N/A;  . ERCP N/A 11/07/2014   Procedure: ENDOSCOPIC RETROGRADE CHOLANGIOPANCREATOGRAPHY (ERCP);  Surgeon: Irene Shipper, MD;  Location: [facility];  Service: Endoscopy;  Laterality: N/A;  . GALLBLADDER SURGERY  09/2014   drain tube placed  . INGUINAL HERNIA REPAIR Left   . KNEE ARTHROSCOPY Right   . LUMBAR LAMINECTOMY/DECOMPRESSION MICRODISCECTOMY Right 12/15/2012   Procedure: LUMBAR LAMINECTOMY/DECOMPRESSION  MICRODISCECTOMY 1 LEVEL;  Surgeon: Elaina Hoops, MD;  Location: [facility];  Service: Neurosurgery;  Laterality: Right;  Right Lumbar four-five laminectomy/foraminotomy  . REVERSE SHOULDER ARTHROPLASTY Left 09/22/2015   Procedure: LEFT REVERSE TOTAL SHOULDER ARTHROPLASTY;  Surgeon: Netta Cedars, MD;  Location: [facility];  Service: Orthopedics;  Laterality: Left;  . REVERSE TOTAL SHOULDER ARTHROPLASTY Left 09/22/2015  . VIDEO BRONCHOSCOPY  07/28/2012   Procedure: VIDEO BRONCHOSCOPY WITH FLUORO;  Surgeon: Chesley Mires, MD;  Location: [facility];  Service: Cardiopulmonary;  Laterality: Bilateral;  . VIDEO BRONCHOSCOPY Bilateral 10/17/2017   Procedure: VIDEO BRONCHOSCOPY WITH FLUORO;  Surgeon: Chesley Mires, MD;  Location: [facility];  Service: Cardiopulmonary;  Laterality: Bilateral;   Family History Family History  Problem Relation Age of Onset  . Heart disease Mother   . Heart disease Father   . Colon polyps Brother   . Congestive Heart Failure Brother   . Coronary artery disease Unknown   . Stomach cancer Paternal Grandmother   . Colon cancer Neg Hx   . Pancreatic cancer Neg Hx   . Rectal cancer Neg Hx   . Esophageal cancer Neg Hx     Social History Social History   Tobacco Use  . Smoking status: Former Smoker    Packs/day: 0.00    Years: 30.00    Pack years: 0.00    Types: Pipe    Last attempt to quit: 06/24/1998    Years since quitting: 19.3  . Smokeless tobacco: Never Used  Substance Use Topics  . Alcohol use: No  . Drug use: No   Allergies Statins  Review of Systems Review of Systems All other systems are reviewed and are negative for acute change except as noted in the HPI  Physical Exam Vital Signs  I have reviewed the triage vital signs BP 101/71   Pulse (!) 142   Temp 97.8 F (36.6 C)   Resp (!) 42   SpO2 100%  , Pain may Physical Exam  Constitutional: He is oriented to person, place, and time. He appears well-developed and well-nourished. No distress.    HENT:  Head: Normocephalic and atraumatic.  Nose: Nose normal.  Eyes: Pupils are equal, round, and reactive to light. Conjunctivae and EOM are normal. Right eye exhibits no discharge. Left eye exhibits no discharge. No scleral icterus.  Neck: Normal range of motion. Neck supple.  Cardiovascular: Regular rhythm. Tachycardia present. Exam reveals no gallop and no friction rub.  No murmur heard. Pulmonary/Chest: Effort normal. No stridor. Tachypnea noted. No respiratory distress. He has decreased breath sounds in the right upper field, the  right middle field and the right lower field. He has no wheezes. He has no rales.  Abdominal: Soft. He exhibits no distension. There is no tenderness.  Musculoskeletal: He exhibits no edema or tenderness.  Neurological: He is alert and oriented to person, place, and time.  Skin: Skin is warm and dry. No rash noted. He is not diaphoretic. No erythema.  Psychiatric: He has a normal mood and affect.  Vitals reviewed.   ED Results and Treatments Labs (all labs ordered are listed, but only abnormal results are displayed) Labs Reviewed  BLOOD GAS, ARTERIAL - Abnormal; Notable for the following components:      Result Value   pH, Arterial 7.470 (*)    pO2, Arterial 82.0 (*)    All other components within normal limits  CBC WITH DIFFERENTIAL/PLATELET - Abnormal; Notable for the following components:   WBC 18.1 (*)    Hemoglobin 12.7 (*)    HCT 38.3 (*)    Platelets 420 (*)    Neutro Abs 15.6 (*)    All other components within normal limits  BRAIN NATRIURETIC PEPTIDE - Abnormal; Notable for the following components:   B Natriuretic Peptide 147.5 (*)    All other components within normal limits  COMPREHENSIVE METABOLIC PANEL - Abnormal; Notable for the following components:   Chloride 99 (*)    Glucose, Bld 171 (*)    Total Protein 6.4 (*)    Albumin 2.6 (*)    AST 13 (*)    ALT 13 (*)    All other components within normal limits  I-STAT CG4 LACTIC  ACID, ED - Abnormal; Notable for the following components:   Lactic Acid, Venous 2.13 (*)    All other components within normal limits  I-STAT CHEM 8, ED - Abnormal; Notable for the following components:   Sodium 133 (*)    Chloride 96 (*)    Glucose, Bld 146 (*)    Hemoglobin 12.6 (*)    HCT 37.0 (*)    All other components within normal limits  CULTURE, BLOOD (ROUTINE X 2)  CULTURE, BLOOD (ROUTINE X 2)  PROTIME-INR  URINALYSIS, ROUTINE W REFLEX MICROSCOPIC  I-STAT TROPONIN, ED  I-STAT CG4 LACTIC ACID, ED                                                                                                                         EKG  EKG Interpretation  Date/Time:  Thursday Oct 23 2017 14:09:54 EDT Ventricular Rate:  143 PR Interval:    QRS Duration: 123 QT Interval:  273 QTC Calculation: 421 R Axis:   41 Text Interpretation:  sinus  tachycardia Ventricular premature complex Nonspecific intraventricular conduction delay NO STEMI Confirmed by Addison Lank [phone]) on 10/23/2017 4:29:13 PM      Radiology Dg Chest 2 View  Result Date: 10/23/2017 CLINICAL DATA:  Shortness of breath. Recently diagnosed with lung cancer. EXAM: CHEST - 2 VIEW COMPARISON:  10/17/2017 and 10/10/2017 CT FINDINGS: There is  significant opacity in the LEFT mid lung zone and LEFT lung base, increased compared with most recent comparison. RIGHT lung is unremarkable. LEFT pleural effusion. Degenerative changes are seen in the thoracic spine. IMPRESSION: Progressive opacity in the LEFT lung, consistent with infectious process/atelectasis, tumor progression, or pleural effusion. Electronically Signed   By: Nolon Nations M.D.   On: 10/23/2017 15:06   Ct Angio Chest Pe W And/or Wo Contrast  Result Date: 10/23/2017 CLINICAL DATA:  History of lung carcinoma, short of breath today, lung sounds absent EXAM: CT ANGIOGRAPHY CHEST WITH CONTRAST TECHNIQUE: Multidetector CT imaging of the chest was performed using the standard  protocol during bolus administration of intravenous contrast. Multiplanar CT image reconstructions and MIPs were obtained to evaluate the vascular anatomy. CONTRAST:  156mL ISOVUE-370 IOPAMIDOL (ISOVUE-370) INJECTION 76% COMPARISON:  Chest x-ray of 10/23/2016 and CT chest 10/10/2017 FINDINGS: Cardiovascular: Moderate cardiomegaly is stable. Diffuse coronary artery calcifications are noted. No pericardial effusion is seen. The thoracic aorta opacifies with no acute abnormality. The pulmonary arteries are well opacified and there is no evidence of acute pulmonary embolism. Mediastinum/Nodes: The previously noted left hilar mass appears to extend into the mediastinum compressing the left pulmonary vein consistent with progressive lung carcinoma. This mass on image 152 measures 68 x 101 mm compared to 72 x 81 mm previously. Pulmonary artery branches do course through this soft tissue mass without evidence of pulmonary embolism. On image 120 series 5 there is a left hilar node indenting the left main pulmonary artery measuring 17 mm in diameter. Anterior mediastinal nodes also are present which appear larger. On image 102 an anterior mediastinal prevascular node measures 14 mm compared to 11 mm previously. These nodes are worrisome for metastatic involvement of mediastinal and hilar lymph nodes. There also is a cluster of nodes precarinal to the right of midline measuring 18 mm, much larger than on the prior CT Lungs/Pleura: The volume of the moderate to large left pleural effusion has increased considerably in the interval resulting in more volume loss of the left upper lobe and left lower lobe. There is truncation of the bronchus to the left upper lobe with some air reaching the lingula and a portion of the left lower lobe. The right lung appears relatively well aerated. No definite right effusion is seen. Upper Abdomen: The liver is unremarkable with no evidence of a hepatic metastases on this exam. Surgical clips are  noted from prior cholecystectomy. The pancreas is unremarkable although somewhat fatty infiltrated. Splenic artery calcifications are present. Musculoskeletal: There are degenerative changes throughout the thoracic spine. Mild compression deformities of several adjacent midthoracic vertebra appear stable. Review of the MIP images confirms the above findings. IMPRESSION: 1. 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 2. Progression of adenopathy involving the left hilum and mediastinum. 3. Increasing size of left pleural effusion. 4. No acute pulmonary embolism is seen. Electronically Signed   By: Ivar Drape M.D.   On: 10/23/2017 16:45   Pertinent labs & imaging results that were available during my care of the patient were reviewed by me and considered in my medical decision making (see chart for details).  Medications Ordered in ED Medications  cefTRIAXone (ROCEPHIN) 2 g in sodium chloride 0.9 % 100 mL IVPB (0 g Intravenous Stopped 10/23/17 1635)  azithromycin (ZITHROMAX) 500 mg in sodium chloride 0.9 % 250 mL IVPB (500 mg Intravenous New Bag/Given 10/23/17 1649)  sodium chloride 0.9 % bolus 1,000 mL (1,000 mLs Intravenous New Bag/Given 10/23/17 1551)  And  sodium chloride 0.9 % bolus 1,000 mL (1,000 mLs Intravenous New Bag/Given 10/23/17 1551)    And  sodium chloride 0.9 % bolus 250 mL (has no administration in time range)  iopamidol (ISOVUE-370) 76 % injection (has no administration in time range)  iopamidol (ISOVUE-370) 76 % injection 100 mL (100 mLs Intravenous Contrast Given 10/23/17 1610)                                                                                                                                    Procedures Procedures   EMERGENCY DEPARTMENT [place] CARDIAC EXAM "Study: Limited Ultrasound of the Heart and Pericardium"  INDICATIONS:Dyspnea Multiple views of the heart and pericardium were obtained in real-time with a multi-frequency probe.  PERFORMED  DX:IPJASN IMAGES ARCHIVED?: Yes LIMITATIONS:  Body habitus VIEWS USED: Parasternal long axis, Parasternal short axis and Apical 4 chamber  INTERPRETATION: Cardiac activity present, Pericardial effusioin absent, Cardiac tamponade absent and Increased contractility  EMERGENCY DEPARTMENT [place] LUNG EXAM "Study: Limited Ultrasound of the Lung and Thorax"  INDICATIONS: Dyspnea Multiple views of both lungs using sagittal orientation were obtained.  PERFORMED BY: Myself IMAGES ARCHIVED?: Yes LIMITATIONS: None VIEWS USED: Anterior lung fields INTERPRETATION: Findings concerning for either pneumo thorax or hydrothorax bilaterally   (including critical care time)  CRITICAL CARE Performed by: Grayce Sessions Hareem Surowiec Total critical care time: 55 minutes Critical care time was exclusive of separately billable procedures and treating other patients. Critical care was necessary to treat or prevent imminent or life-threatening deterioration. Critical care was time spent personally by me on the following activities: development of treatment plan with patient and/or surrogate as well as nursing, discussions with consultants, evaluation of patient's response to treatment, examination of patient, obtaining history from patient or surrogate, ordering and performing treatments and interventions, ordering and review of laboratory studies, ordering and review of radiographic studies, pulse oximetry and re-evaluation of patient's condition.    Medical Decision Making / ED Course I have reviewed the nursing notes for this encounter and the patient's prior records (if available in EHR or on provided paperwork).    Initial concern for pneumothorax given the onset after a coughing spell.  Bedside ultrasound was concerning for possible pneumothorax versus hydrothorax.  Also perform bedside ultrasound which rule out pericardial effusion and tamponade.  Chest x-ray revealed increased pleural effusion.  Given the  additional concern for possible pulmonary embolism given his setting, CTA was obtained which did not reveal evidence of pulmonary embolism or pneumothorax.  However it did reveal increase in the size of the pleural effusion.  Also noted mild right pleural effusion consistent with the bedside ultrasound performed.  Patient was hypoxic on room air and requiring 2 L nasal cannula.   Patient had elevated lactic acid with leukocytosis and tachycardia in the setting of cough and shortness of breath does code sepsis was initiated and patient was started on empiric antibiotics.  Given his  tachycardia and soft blood pressures, 30 cc/kg of IV fluids were initiated.  1- 1/2 hours after initiation, patient was evaluated and his blood pressure had improved as well as tachycardia.  I discussed the case with medicine who will admit the patient for continued work-up and management.  Final Clinical Impression(s) / ED Diagnoses Final diagnoses:  Respiratory distress  Chronic bilateral pleural effusions  Hypoxia      This chart was dictated using voice recognition software.  Despite best efforts to proofread,  errors can occur which can change the documentation meaning.   Fatima Blank, MD 10/23/17 1730

## 2017-10-23 NOTE — ED Notes (Signed)
ED TO INPATIENT HANDOFF REPORT  Name/Age/Gender Travis Ferguson 77 y.o. male  Code Status    Code Status Orders  (From admission, onward)        Start     Ordered   10/23/17 1804  Do not attempt resuscitation (DNR)  Continuous    Question Answer Comment  In the event of cardiac or respiratory ARREST Do not call a "code blue"   In the event of cardiac or respiratory ARREST Do not perform Intubation, CPR, defibrillation or ACLS   In the event of cardiac or respiratory ARREST Use medication by any route, position, wound care, and other measures to relive pain and suffering. May use oxygen, suction and manual treatment of airway obstruction as needed for comfort.      10/23/17 1805    Code Status History    Date Active Date Inactive Code Status Order ID Comments User Context   10/12/2017 1215 10/13/2017 2029 Partial Code [id number]  Florencia Reasons, MD Inpatient   10/09/2017 1848 10/12/2017 1215 Full Code [id number]  Kayleen Memos, DO Inpatient   09/02/2017 1835 09/08/2017 1820 Full Code [id number]  Karmen Bongo, MD ED   02/24/2015 1316 02/27/2015 1356 Full Code [id number]  Erline Hau, MD ED   01/23/2015 [phone] 01/23/2015 2105 Full Code [id number]  Lorretta Harp, MD Inpatient   11/07/2014 1745 11/10/2014 1349 Full Code [id number]  Kinnie Feil, MD Inpatient   11/07/2014 1232 11/07/2014 1745 Full Code [id number]  Jacqulynn Cadet, MD Inpatient   08/16/2014 1255 08/25/2014 1740 Full Code [id number]  Donne Hazel, MD ED    Advance Directive Documentation     Most Recent Value  Type of Advance Directive  Healthcare Power of Attorney, Living will  Pre-existing out of facility DNR order (yellow form or pink MOST form)  -  "MOST" Form in Place?  -      Home/SNF/Other Home  Chief Complaint SOB  Level of Care/Admitting Diagnosis ED Disposition    ED Disposition Condition [place]: [facility] [[id number]]  Level of Care: Telemetry [5]  Admit to  tele based on following criteria: Complex arrhythmia (Bradycardia/Tachycardia)  Diagnosis: Malignant pleural effusion [511.81.ICD-9-CM]  Admitting Physician: Patrecia Pour, EDWIN [[id number]]  Attending Physician: Patrecia Pour, EDWIN [phone]  Bed request comments: 4 [place]  PT Class (Do Not Modify): Observation [104]  PT Acc Code (Do Not Modify): Observation [10022]       Medical History Past Medical History:  Diagnosis Date  . Acute on chronic combined systolic and diastolic CHF (congestive heart failure) ([place]) 02/24/2015  . Anxiety   . Arthritis    "about q joint I've got" (09/22/2015)  . Atherosclerosis   . CAP (community acquired pneumonia) 02/24/2015  . Cervical disc disease   . Chronic lower back pain    chronic back pain/under pain management  . Colon polyps 08/02/2014   Tubular adenoma x 3, Hyperplastic-1  . COPD (chronic obstructive pulmonary disease) ([place])    a. Former Airline pilot, also smoked a pipe.  . Coronary artery calcification seen on CAT scan   . Depression   . Dyslipidemia   . Dyspnea    chronic  . GERD (gastroesophageal reflux disease)   . Hiatal hernia    sensation problem ("right lateral side hip to knee").  . History of blood transfusion "numerous"  . History of oxygen administration    @ 2 l/m nasally at bedtime.(09/22/2015)  . Hypertension   .  Inguinal hernia    right side  . Migraine    "years since I've had one" (09/22/2015)  . OSA (obstructive sleep apnea)    does not use CPAP; "just oxygen" (09/22/2015)  . PTSD (post-traumatic stress disorder)   . Rhinitis   . Sarcoidosis   . Sensation problem    right side-lateral hip to knee" decreased sensation and tingling feeling" -has informed Dr. Joylene Draft, Dr. Henrene Pastor, Dr. Lucia Gaskins of this.  . Sepsis ([place])   . Steroid-induced hyperglycemia 02/25/2015  . Subdural hematoma ([place]) 11/10   a. 2010 s/p surgery - diagnosed several weeks after a fall.    Allergies Allergies  Allergen Reactions  . Statins Other (See  Comments)    Muscle aches - tolerating Vytorin    IV Location/Drains/Wounds Patient Lines/Drains/Airways Status   Active Line/Drains/Airways    Name:   Placement date:   Placement time:   Site:   Days:   Peripheral IV 10/23/17 Right Antecubital   10/23/17    1428    Antecubital   less than 1   Peripheral IV 10/23/17 Left Antecubital   10/23/17    1837    Antecubital   less than 1          Labs/Imaging Results for orders placed or performed during the hospital encounter of 10/23/17 (from the past 48 hour(s))  CBC with Differential/Platelet     Status: Abnormal   Collection Time: 10/23/17  3:13 PM  Result Value Ref Range   WBC 18.1 (H) 4.0 - 10.5 K/uL   RBC 4.57 4.22 - 5.81 MIL/uL   Hemoglobin 12.7 (L) 13.0 - 17.0 g/dL   HCT 38.3 (L) 39.0 - 52.0 %   MCV 83.8 78.0 - 100.0 fL   MCH 27.8 26.0 - 34.0 pg   MCHC 33.2 30.0 - 36.0 g/dL   RDW 14.9 11.5 - 15.5 %   Platelets 420 (H) 150 - 400 K/uL   Neutrophils Relative % 86 %   Neutro Abs 15.6 (H) 1.7 - 7.7 K/uL   Lymphocytes Relative 8 %   Lymphs Abs 1.4 0.7 - 4.0 K/uL   Monocytes Relative 5 %   Monocytes Absolute 0.9 0.1 - 1.0 K/uL   Eosinophils Relative 1 %   Eosinophils Absolute 0.2 0.0 - 0.7 K/uL   Basophils Relative 0 %   Basophils Absolute 0.0 0.0 - 0.1 K/uL    Comment: Performed at [facility], [place] [address], [place], [place] [address]  Brain natriuretic peptide     Status: Abnormal   Collection Time: 10/23/17  3:13 PM  Result Value Ref Range   B Natriuretic Peptide 147.5 (H) 0.0 - 100.0 pg/mL    Comment: Performed at [facility], [place] [address] [place], [place] [address]  Comprehensive metabolic panel     Status: Abnormal   Collection Time: 10/23/17  3:13 PM  Result Value Ref Range   Sodium 136 135 - 145 mmol/L   Potassium 3.8 3.5 - 5.1 mmol/L   Chloride 99 (L) 101 - 111 mmol/L   CO2 25 22 - 32 mmol/L   Glucose, Bld 171 (H) 65 - 99 mg/dL   BUN 14 6 - 20 mg/dL   Creatinine,  Ser 0.99 0.61 - 1.24 mg/dL   Calcium 9.2 8.9 - 10.3 mg/dL   Total Protein 6.4 (L) 6.5 - 8.1 g/dL   Albumin 2.6 (L) 3.5 - 5.0 g/dL   AST 13 (L) 15 - 41 U/L   ALT 13 (  L) 17 - 63 U/L   Alkaline Phosphatase 67 38 - 126 U/L   Total Bilirubin 0.8 0.3 - 1.2 mg/dL   GFR calc non Af Amer >60 >60 mL/min   GFR calc Af Amer >60 >60 mL/min    Comment: (NOTE) The eGFR has been calculated using the CKD EPI equation. This calculation has not been validated in all clinical situations. eGFR's persistently <60 mL/min signify possible Chronic Kidney Disease.    Anion gap 12 5 - 15    Comment: Performed at [facility], [place] [address] [place], [place] [address]  Protime-INR     Status: None   Collection Time: 10/23/17  3:13 PM  Result Value Ref Range   Prothrombin Time 13.1 11.4 - 15.2 seconds   INR 1.00     Comment: Performed at [facility], [place] [address] [place], [place] [address]  I-Stat Troponin, ED (not at [facility])     Status: None   Collection Time: 10/23/17  3:24 PM  Result Value Ref Range   Troponin i, poc 0.00 0.00 - 0.08 ng/mL   Comment 3            Comment: Due to the release kinetics of cTnI, a negative result within the first hours of the onset of symptoms does not rule out myocardial infarction with certainty. If myocardial infarction is still suspected, repeat the test at appropriate intervals.   I-stat chem 8, ed     Status: Abnormal   Collection Time: 10/23/17  3:26 PM  Result Value Ref Range   Sodium 133 (L) 135 - 145 mmol/L   Potassium 3.6 3.5 - 5.1 mmol/L   Chloride 96 (L) 101 - 111 mmol/L   BUN 13 6 - 20 mg/dL   Creatinine, Ser 0.90 0.61 - 1.24 mg/dL   Glucose, Bld 146 (H) 65 - 99 mg/dL   Calcium, Ion 1.17 1.15 - 1.40 mmol/L   TCO2 27 22 - 32 mmol/L   Hemoglobin 12.6 (L) 13.0 - 17.0 g/dL   HCT 37.0 (L) 39.0 - 52.0 %  I-Stat CG4 Lactic Acid, ED     Status: Abnormal   Collection Time: 10/23/17  3:27 PM  Result Value Ref Range    Lactic Acid, Venous 2.13 (HH) 0.5 - 1.9 mmol/L   Comment NOTIFIED PHYSICIAN   Blood gas, arterial     Status: Abnormal   Collection Time: 10/23/17  3:35 PM  Result Value Ref Range   O2 Content 3.0 L/min   Delivery systems NASAL CANNULA    pH, Arterial 7.470 (H) 7.350 - 7.450   pCO2 arterial 34.4 32.0 - 48.0 mmHg   pO2, Arterial 82.0 (L) 83.0 - 108.0 mmHg   Bicarbonate 24.7 20.0 - 28.0 mmol/L   Acid-Base Excess 1.8 0.0 - 2.0 mmol/L   O2 Saturation 96.5 %   Patient temperature 98.6    Collection site RIGHT RADIAL    Drawn by [id number]    Sample type ARTERIAL DRAW    Allens test (pass/fail) PASS PASS    Comment: Performed at [facility], [place] [address] [place], [place] [address]  I-Stat CG4 Lactic Acid, ED     Status: None   Collection Time: 10/23/17  5:04 PM  Result Value Ref Range   Lactic Acid, Venous 1.55 0.5 - 1.9 mmol/L   Dg Chest 2 View  Result Date: 10/23/2017 CLINICAL DATA:  Shortness of breath. Recently diagnosed with lung cancer. EXAM: CHEST - 2 VIEW COMPARISON:  10/17/2017 and 10/10/2017 CT FINDINGS: There is significant opacity in the LEFT mid lung zone and LEFT lung base, increased compared with most recent comparison. RIGHT lung is unremarkable. LEFT pleural effusion. Degenerative changes are seen in the thoracic spine. IMPRESSION: Progressive opacity in the LEFT lung, consistent with infectious process/atelectasis, tumor progression, or pleural effusion. Electronically Signed   By: Nolon Nations M.D.   On: 10/23/2017 15:06   Ct Angio Chest Pe W And/or Wo Contrast  Result Date: 10/23/2017 CLINICAL DATA:  History of lung carcinoma, short of breath today, lung sounds absent EXAM: CT ANGIOGRAPHY CHEST WITH CONTRAST TECHNIQUE: Multidetector CT imaging of the chest was performed using the standard protocol during bolus administration of intravenous contrast. Multiplanar CT image reconstructions and MIPs were obtained to evaluate the vascular anatomy. CONTRAST:   127m ISOVUE-370 IOPAMIDOL (ISOVUE-370) INJECTION 76% COMPARISON:  Chest x-ray of 10/23/2016 and CT chest 10/10/2017 FINDINGS: Cardiovascular: Moderate cardiomegaly is stable. Diffuse coronary artery calcifications are noted. No pericardial effusion is seen. The thoracic aorta opacifies with no acute abnormality. The pulmonary arteries are well opacified and there is no evidence of acute pulmonary embolism. Mediastinum/Nodes: The previously noted left hilar mass appears to extend into the mediastinum compressing the left pulmonary vein consistent with progressive lung carcinoma. This mass on image 152 measures 68 x 101 mm compared to 72 x 81 mm previously. Pulmonary artery branches do course through this soft tissue mass without evidence of pulmonary embolism. On image 120 series 5 there is a left hilar node indenting the left main pulmonary artery measuring 17 mm in diameter. Anterior mediastinal nodes also are present which appear larger. On image 102 an anterior mediastinal prevascular node measures 14 mm compared to 11 mm previously. These nodes are worrisome for metastatic involvement of mediastinal and hilar lymph nodes. There also is a cluster of nodes precarinal to the right of midline measuring 18 mm, much larger than on the prior CT Lungs/Pleura: The volume of the moderate to large left pleural effusion has increased considerably in the interval resulting in more volume loss of the left upper lobe and left lower lobe. There is truncation of the bronchus to the left upper lobe with some air reaching the lingula and a portion of the left lower lobe. The right lung appears relatively well aerated. No definite right effusion is seen. Upper Abdomen: The liver is unremarkable with no evidence of a hepatic metastases on this exam. Surgical clips are noted from prior cholecystectomy. The pancreas is unremarkable although somewhat fatty infiltrated. Splenic artery calcifications are present. Musculoskeletal: There  are degenerative changes throughout the thoracic spine. Mild compression deformities of several adjacent midthoracic vertebra appear stable. Review of the MIP images confirms the above findings. IMPRESSION: 1. 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 2. Progression of adenopathy involving the left hilum and mediastinum. 3. Increasing size of left pleural effusion. 4. No acute pulmonary embolism is seen. Electronically Signed   By: PIvar DrapeM.D.   On: 10/23/2017 16:45    Pending Labs Unresulted Labs (From admission, onward)   Start     Ordered   10/24/17 0500  Comprehensive metabolic panel  Tomorrow morning,   R     10/23/17 1805   10/24/17 0500  CBC  Tomorrow morning,   R     10/23/17 1805   10/23/17 1513  Blood Culture (routine x 2)  BLOOD CULTURE X 2,   STAT     10/23/17 1514   10/23/17 1513  Urinalysis, Routine w reflex microscopic  STAT,   STAT     10/23/17 1514      Vitals/Pain Today's Vitals   10/23/17 1351 10/23/17 1400 10/23/17 1401 10/23/17 1734  BP: 101/71 101/71  99/78  Pulse: (!) 145 (!) 142  (!) 116  Resp: (!) 25 (!) 42  (!) 23  Temp: 97.8 F (36.6 C)     SpO2: 100% 100%  100%  PainSc:   0-No pain     Isolation Precautions No active isolations  Medications Medications  iopamidol (ISOVUE-370) 76 % injection (has no administration in time range)  amiodarone (PACERONE) tablet 100 mg (has no administration in time range)  atorvastatin (LIPITOR) tablet 20 mg (has no administration in time range)  benzonatate (TESSALON) capsule 200 mg (has no administration in time range)  mometasone-formoterol (DULERA) 200-5 MCG/ACT inhaler 2 puff (has no administration in time range)  DULoxetine (CYMBALTA) DR capsule 30 mg (has no administration in time range)  sacubitril-valsartan (ENTRESTO) 24-26 mg per tablet (has no administration in time range)  finasteride (PROSCAR) tablet 5 mg (has no administration in time range)  FLUoxetine (PROZAC) capsule 40  mg (has no administration in time range)  furosemide (LASIX) tablet 20 mg (has no administration in time range)  HYDROcodone-acetaminophen (NORCO/VICODIN) 5-325 MG per tablet 1 tablet (has no administration in time range)  guaiFENesin (MUCINEX) 12 hr tablet 600 mg (has no administration in time range)  metoprolol tartrate (LOPRESSOR) tablet 25 mg (has no administration in time range)  bisacodyl (DULCOLAX) EC tablet 5 mg (has no administration in time range)  ondansetron (ZOFRAN) tablet 4 mg (has no administration in time range)    Or  ondansetron (ZOFRAN) injection 4 mg (has no administration in time range)  sodium chloride 0.9 % bolus 1,000 mL (0 mLs Intravenous Stopped 10/23/17 1735)    And  sodium chloride 0.9 % bolus 1,000 mL (1,000 mLs Intravenous New Bag/Given 10/23/17 1551)    And  sodium chloride 0.9 % bolus 250 mL (250 mLs Intravenous New Bag/Given 10/23/17 1817)  iopamidol (ISOVUE-370) 76 % injection 100 mL (100 mLs Intravenous Contrast Given 10/23/17 1610)  morphine 2 MG/ML injection 2 mg (2 mg Intravenous Given 10/23/17 1817)    Mobility walks with person assist

## 2017-10-23 NOTE — Progress Notes (Signed)
Patient and family expressed that patient would want to be resuscitated in the event of a code blue but not sustained on life support if there is no chance of returning to baseline despite DNR on file. On call provider paged and code status changed to full code.

## 2017-10-24 ENCOUNTER — Inpatient Hospital Stay: Payer: Medicare Other | Admitting: Pulmonary Disease

## 2017-10-24 ENCOUNTER — Observation Stay (HOSPITAL_COMMUNITY): Payer: Medicare Other

## 2017-10-24 DIAGNOSIS — J910 Malignant pleural effusion: Secondary | ICD-10-CM | POA: Diagnosis not present

## 2017-10-24 LAB — COMPREHENSIVE METABOLIC PANEL
ALT: 13 U/L — ABNORMAL LOW (ref 17–63)
AST: 13 U/L — AB (ref 15–41)
Albumin: 2.4 g/dL — ABNORMAL LOW (ref 3.5–5.0)
Alkaline Phosphatase: 58 U/L (ref 38–126)
Anion gap: 9 (ref 5–15)
BILIRUBIN TOTAL: 0.7 mg/dL (ref 0.3–1.2)
BUN: 13 mg/dL (ref 6–20)
CO2: 23 mmol/L (ref 22–32)
Calcium: 8.8 mg/dL — ABNORMAL LOW (ref 8.9–10.3)
Chloride: 103 mmol/L (ref 101–111)
Creatinine, Ser: 0.88 mg/dL (ref 0.61–1.24)
GFR calc Af Amer: >60 mL/min (ref >60)
GLUCOSE: 117 mg/dL — AB (ref 65–99)
Potassium: 3.6 mmol/L (ref 3.5–5.1)
Sodium: 135 mmol/L (ref 135–145)
Total Protein: 5.9 g/dL — ABNORMAL LOW (ref 6.5–8.1)

## 2017-10-24 LAB — CBC
HCT: 33.4 % — ABNORMAL LOW (ref 39.0–52.0)
Hemoglobin: 10.9 g/dL — ABNORMAL LOW (ref 13.0–17.0)
MCH: 27.3 pg (ref 26.0–34.0)
MCHC: 32.6 g/dL (ref 30.0–36.0)
MCV: 83.5 fL (ref 78.0–100.0)
Platelets: 342 10*3/uL (ref 150–400)
RBC: 4 MIL/uL — ABNORMAL LOW (ref 4.22–5.81)
RDW: 14.8 % (ref 11.5–15.5)
WBC: 13.8 10*3/uL — AB (ref 4.0–10.5)

## 2017-10-24 LAB — LACTATE DEHYDROGENASE, PLEURAL OR PERITONEAL FLUID: LD, Fluid: 158 U/L — ABNORMAL HIGH (ref 3–23)

## 2017-10-24 LAB — PROTEIN, PLEURAL OR PERITONEAL FLUID

## 2017-10-24 LAB — AMYLASE, PLEURAL OR PERITONEAL FLUID: Amylase, Fluid: 33 U/L

## 2017-10-24 MED ORDER — SACCHAROMYCES BOULARDII 250 MG PO CAPS: 250.0000 mg | ORAL_CAPSULE | Freq: Two times a day (BID) | ORAL | 0 refills | Status: AC

## 2017-10-24 MED ORDER — APIXABAN 5 MG PO TABS: 5.0000 mg | ORAL_TABLET | Freq: Two times a day (BID) | ORAL | 0 refills | Status: DC

## 2017-10-24 MED ORDER — LIDOCAINE HCL 1 % IJ SOLN
INTRAMUSCULAR | Status: AC
  Filled 2017-10-24: qty 10

## 2017-10-24 MED ORDER — LEVOFLOXACIN 750 MG PO TABS: 750.0000 mg | ORAL_TABLET | Freq: Every day | ORAL | 0 refills | Status: AC

## 2017-10-24 NOTE — Procedures (Signed)
Ultrasound-guided diagnostic and therapeutic left thoracentesis performed yielding 1.3 liters of hazy, yellow fluid. No immediate complications. Follow-up chest x-ray pending. The fluid was sent to the lab for preordered studies.

## 2017-10-24 NOTE — Discharge Summary (Signed)
Physician Discharge Summary  Travis Ferguson  WEX:[id number]  DOB: Apr 25, 1941  DOA: 10/23/2017 PCP: Crist Infante, MD  Admit date: 10/23/2017 Discharge date: 10/24/2017  Admitted From: Home Disposition: Home  Recommendations for Outpatient Follow-up:  1. Follow up with PCP in 1 week 2. Follow-up with oncology on May 14 at 1:45 PM 3. Please obtain BMP/CBC in one week to monitor renal function and hemoglobin 4. Follow final results from pleura cytology and gram stain  Discharge Condition: Stable CODE STATUS: Full code Diet recommendation: Heart Healthy   Brief/Interim Summary: For full details see H&P/Progress note, but in brief, Travis Ferguson is a 77 y.o. male with medical history significant of just diagnosed with NSCLC by Dr. Halford Chessman 2 days ago, COPD, OSA, sarcoidosis, chronic pain syndrome who presented to the emergency department complaining of acute onset on worsening shortness of breath.  Patient was found to be hypoxic, chest x-ray showed large pleural effusion and patient was admitted for thoracentesis.  Ultrasound-guided thoracentesis was performed revealing 1.3 L of hazy yellow fluid.  Fluid analysis was sent with LDH elevated.  Repeat x-ray shows persistent consolidation and patient was started on antibiotic therapy. Post thoracentesis patient had significant improvement and patient was deemed stable to follow-up as an outpatient.  Subjective: Patient seen and examined, doing much better status post thoracentesis.  No acute distress.  She remains afebrile WBC trending down  Discharge Diagnoses/Hospital Course:  Acute on chronic hypoxic respiratory failure due to malignant pleural effusion CT shows increase in left-sided pleural effusion. Underwent ultrasound-guided thoracentesis, yielding 1.3 L of hazy fluid Patient discussed with PCCM who recommended to treat with thoracentesis for now as patient has not receive cancer treatment.  Patient is a scheduled to see oncologist Dr. Earlie Server  on May 14th.   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 Follow up with oncology as outpatient   Leukocytosis  Felt to be related malignancy and ? Pneumonia, Give elevated LDH in pleural fluid and haziness, also CXR with persistent consolidation, and elevated WBC, will treat with oral Abx, will start Levaquin 750 mg daily for 5 days. WBC improving. Follow up with PCP   COPD/OSA No signs of exacerbation at this time, respiratory issues were due to pleural effusion Continue albuterol and Symbicort Continue oxygen supplementation CPAP at night  Combined CHF No signs of fluid overload, pleural effusion due to malignancy Continue Entresto, Lopressor and Lasix  PAF  Patient initially tachycardic, responded well to IV fluids Continue Lopressor, supposed to be Eliquis but this was held due to prior bronchoscopy. Held due to thoracentesis, can resume on 5/4. Patient Oncology appointment is on May 14th, will benefit from CVA protection until then. No surgical procedures are planned at this point  Continue amiodarone  All other chronic medical condition were stable during the hospitalization.  On the day of the discharge the patient's vitals were stable, and no other acute medical condition were reported by patient. the patient was felt safe to be discharge to home   Discharge Instructions  You were cared for by a hospitalist during your hospital stay. If you have any questions about your discharge medications or the care you received while you were in the hospital after you are discharged, you can call the unit and asked to speak with the hospitalist on call if the hospitalist that took care of you is not available. Once you are discharged, your primary care physician will handle any further medical issues. Please note that NO  REFILLS for any discharge medications will be authorized once you are discharged, as it is imperative that you return to your primary  care physician (or establish a relationship with a primary care physician if you do not have one) for your aftercare needs so that they can reassess your need for medications and monitor your lab values.  Discharge Instructions    Call MD for:  difficulty breathing, headache or visual disturbances   Complete by:  As directed    Call MD for:  extreme fatigue   Complete by:  As directed    Call MD for:  hives   Complete by:  As directed    Call MD for:  persistant dizziness or light-headedness   Complete by:  As directed    Call MD for:  persistant nausea and vomiting   Complete by:  As directed    Call MD for:  redness, tenderness, or signs of infection (pain, swelling, redness, odor or green/yellow discharge around incision site)   Complete by:  As directed    Call MD for:  severe uncontrolled pain   Complete by:  As directed    Call MD for:  temperature >100.4   Complete by:  As directed    Diet - low sodium heart healthy   Complete by:  As directed    Increase activity slowly   Complete by:  As directed      Allergies as of 10/24/2017      Reactions   Statins Other (See Comments)   Muscle aches - tolerating Vytorin      Medication List    STOP taking these medications   sodium chloride 0.9 % nebulizer solution     TAKE these medications   acetaminophen 325 MG tablet Commonly known as:  TYLENOL Take 650 mg by mouth every 6 (six) hours as needed for mild pain.   albuterol 108 (90 Base) MCG/ACT inhaler Commonly known as:  PROVENTIL HFA;VENTOLIN HFA 1-2 PUFFS EVERY 4-6 HOURS AS NEEDED FOR SHORTNESS OF BREATH   amiodarone 100 MG tablet Commonly known as:  PACERONE Take 1 tablet (100 mg total) by mouth daily.   apixaban 5 MG Tabs tablet Commonly known as:  ELIQUIS Take 1 tablet (5 mg total) by mouth 2 (two) times daily. Start taking on:  10/25/2017   atorvastatin 20 MG tablet Commonly known as:  LIPITOR Take 1 tablet (20 mg total) by mouth daily at 6 PM.    benzonatate 200 MG capsule Commonly known as:  TESSALON Take 1 capsule (200 mg total) by mouth 3 (three) times daily as needed for cough.   budesonide-formoterol 160-4.5 MCG/ACT inhaler Commonly known as:  SYMBICORT Inhale 2 puffs into the lungs 2 (two) times daily.   chlorhexidine 0.12 % solution Commonly known as:  PERIDEX chlorhexidine gluconate 0.12 % mouthwash   DULoxetine 30 MG capsule Commonly known as:  CYMBALTA Take 30 mg by mouth daily.   ENTRESTO 24-26 MG Generic drug:  sacubitril-valsartan TAKE 1 TABLET BY MOUTH 2 (TWO) TIMES DAILY.   finasteride 5 MG tablet Commonly known as:  PROSCAR Take 5 mg by mouth daily.   FLUoxetine 40 MG capsule Commonly known as:  PROZAC Take 40 mg by mouth daily with breakfast.   furosemide 20 MG tablet Commonly known as:  LASIX Take 1 tablet (20 mg total) by mouth 2 (two) times daily.   guaiFENesin 600 MG 12 hr tablet Commonly known as:  MUCINEX Take 1 tablet (600 mg total) by mouth  2 (two) times daily.   HYDROcodone-acetaminophen 5-325 MG tablet Commonly known as:  NORCO/VICODIN Take 1 tablet by mouth every 8 (eight) hours as needed for moderate pain.   ipratropium 0.02 % nebulizer solution Commonly known as:  ATROVENT Take 2.5 mLs (0.5 mg total) by nebulization 4 (four) times daily.   levalbuterol 1.25 MG/0.5ML nebulizer solution Commonly known as:  XOPENEX Take 1.25 mg by nebulization every 8 (eight) hours as needed for wheezing or shortness of breath.   levofloxacin 750 MG tablet Commonly known as:  LEVAQUIN Take 1 tablet (750 mg total) by mouth daily for 5 days.   metoprolol tartrate 25 MG tablet Commonly known as:  LOPRESSOR TAKE 1 TABLET (25 MG TOTAL) BY MOUTH 2 (TWO) TIMES DAILY.   OXYGEN Inhale 2 L/min into the lungs at bedtime.   saccharomyces boulardii 250 MG capsule Commonly known as:  FLORASTOR Take 1 capsule (250 mg total) by mouth 2 (two) times daily for 10 days.   sodium chloride 0.65 % Soln  nasal spray Commonly known as:  OCEAN Place 1-2 sprays into both nostrils daily as needed for congestion.      Follow-up Information    Crist Infante, MD. Schedule an appointment as soon as possible for a visit in 1 week(s).   Specialty:  Internal Medicine Why:  Hospital follow-up Contact information: [place] [address] [phone]        Lorretta Harp, MD .   Specialties:  Cardiology, Radiology Contact information: [address] [place] [address] [phone]        Chesley Mires, MD. Schedule an appointment as soon as possible for a visit in 1 week(s).   Specialty:  Pulmonary Disease Why:  Hospital follow-up Contact information: [address] [place] [address] [phone]          Allergies  Allergen Reactions  . Statins Other (See Comments)    Muscle aches - tolerating Vytorin    Consultations:  IR    Procedures/Studies: Dg Chest 1 View  Result Date: 10/24/2017 CLINICAL DATA:  Shortness of breath.  Status post thoracentesis EXAM: CHEST  1 VIEW COMPARISON:  Study obtained earlier in the day FINDINGS: Left pleural effusion is smaller following thoracentesis. No pneumothorax. There remains consolidation in the left mid and lower lung zones with small left pleural effusion. Right lung is somewhat hyperexpanded but clear. Heart is upper normal in size with pulmonary vascularity within normal limits. No adenopathy. There is aortic atherosclerosis. There is a total shoulder replacement on the left. IMPRESSION: Left pleural effusion smaller following thoracentesis. No pneumothorax. Persistent left lower lobe consolidation with volume loss. Small residual left pleural effusion. Right lung clear. Stable cardiac silhouette. Electronically Signed   By: Lowella Grip III M.D.   On: 10/24/2017 11:08   Dg Chest 1 View  Result Date: 10/09/2017 CLINICAL DATA:  77 y/o  M; left thoracentesis. EXAM: CHEST  1 VIEW COMPARISON:   10/09/2017 chest radiograph FINDINGS: Stable cardiomegaly. Large left-sided pleural effusion with opacity of left mid and lower lung zones. A no appreciable pneumothorax. Clear right lung. Left shoulder reverse hemiarthroplasty is partially visualized. No acute osseous abnormality identified. IMPRESSION: Large left pleural effusion with opacification of left mid and lower lung zones. No appreciable pneumothorax. Electronically Signed   By: Kristine Garbe M.D.   On: 10/09/2017 18:13   Dg Chest 2 View  Result Date: 10/23/2017 CLINICAL DATA:  Shortness of breath. Recently diagnosed with lung cancer. EXAM: CHEST -  2 VIEW COMPARISON:  10/17/2017 and 10/10/2017 CT FINDINGS: There is significant opacity in the LEFT mid lung zone and LEFT lung base, increased compared with most recent comparison. RIGHT lung is unremarkable. LEFT pleural effusion. Degenerative changes are seen in the thoracic spine. IMPRESSION: Progressive opacity in the LEFT lung, consistent with infectious process/atelectasis, tumor progression, or pleural effusion. Electronically Signed   By: Nolon Nations M.D.   On: 10/23/2017 15:06   Dg Chest 2 View  Result Date: 10/09/2017 CLINICAL DATA:  Shortness of breath an weakness today. EXAM: CHEST - 2 VIEW COMPARISON:  09/08/2017 FINDINGS: The right chest remains clear. There is extensive pneumonia throughout the left lower lobe with volume loss. Probable associated effusion on the left. IMPRESSION: Extensive left lower lobe pneumonia with volume loss. Probable associated pleural fluid. Electronically Signed   By: Nelson Chimes M.D.   On: 10/09/2017 11:24   Ct Chest Wo Contrast  Result Date: 10/10/2017 CLINICAL DATA:  Pneumonia; history acute on chronic combined systolic and diastolic CHF, COPD, coronary artery disease, hypertension, sarcoidosis, former smoker EXAM: CT CHEST WITHOUT CONTRAST TECHNIQUE: Multidetector CT imaging of the chest was performed following the standard protocol  without IV contrast. Sagittal and coronal MPR images reconstructed from axial data set. COMPARISON:  Chest radiograph 10/09/2017, CT chest 07/12/2016 FINDINGS: Cardiovascular: Atherosclerotic calcifications aorta, proximal great vessels, and coronary arteries. No pericardial effusion. 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 Mediastinum/Nodes: Numerous calcified mediastinal and hilar lymph nodes again identified consistent with history of sarcoidosis, unchanged. No additional thoracic adenopathy. Esophagus unremarkable. Base of cervical region normal appearance. Lungs/Pleura: LEFT pleural effusion. Consolidation and volume loss in LEFT lower lobe. Calcified pulmonary granulomata. Central peribronchial thickening. Hyperinflated/lucent LEFT upper lobe. Beaded appearance of airways in LEFT lower lobe consistent with bronchiectasis. Diffuse narrowing of the LEFT mainstem bronchus with occlusion of LEFT upper lobe and LEFT lower lobe bronchi at their origins versus previous exam. Large area of soft tissue opacity at the LEFT pulmonary hilum highly concerning for tumor, contiguous extending into mediastinum, associated with occlusion of the LEFT upper lobe and LEFT lower lobe bronchi and marked compression of the distal LEFT mainstem bronchus. Area of soft tissue opacity measures estimated 8.1 x 7.2 x 6.7 cm. Finding is concerning for malignancy invading the hilum and mediastinum. Upper Abdomen: Small cystic lesion with calcification at the RIGHT lobe of the liver 19 x 13 mm image 141. Duodenal diverticulum. Remaining visualized upper abdomen unremarkable. Musculoskeletal: Diffuse osseous demineralization. IMPRESSION: Chronic changes of sarcoidosis. Abnormal soft tissue opacity at the LEFT pulmonary hilum approximately 8.1 x 7.2 x 6.7 cm in size associated with occlusion of the distal LEFT mainstem bronchus and proximal LEFT upper lobe/LEFT lower lobe bronchi highly suspicious for  malignancy invading mediastinum; bronchoscopic evaluation recommended. Postobstructive pneumonitis involving the LEFT lower lobe with associated LEFT lower lobe bronchiectasis and new LEFT pleural effusion. Mild aneurysmal dilatation of the ascending thoracic aorta 4.2 cm greatest size, recommendation below. Recommend annual imaging followup by CTA or MRA. This recommendation follows 2010 ACCF/AHA/AATS/ACR/ASA/SCA/SCAI/SIR/STS/SVM Guidelines for the Diagnosis and Management of Patients with Thoracic Aortic Disease. Circulation. 2010; 121: A630-Z601 Aortic Atherosclerosis (ICD10-I70.0). Electronically Signed   By: Lavonia Dana M.D.   On: 10/10/2017 17:19   Ct Angio Chest Pe W And/or Wo Contrast  Result Date: 10/23/2017 CLINICAL DATA:  History of lung carcinoma, short of breath today, lung sounds absent EXAM: CT ANGIOGRAPHY CHEST WITH CONTRAST TECHNIQUE: Multidetector CT imaging of the chest was performed  using the standard protocol during bolus administration of intravenous contrast. Multiplanar CT image reconstructions and MIPs were obtained to evaluate the vascular anatomy. CONTRAST:  139mL ISOVUE-370 IOPAMIDOL (ISOVUE-370) INJECTION 76% COMPARISON:  Chest x-ray of 10/23/2016 and CT chest 10/10/2017 FINDINGS: Cardiovascular: Moderate cardiomegaly is stable. Diffuse coronary artery calcifications are noted. No pericardial effusion is seen. The thoracic aorta opacifies with no acute abnormality. The pulmonary arteries are well opacified and there is no evidence of acute pulmonary embolism. Mediastinum/Nodes: The previously noted left hilar mass appears to extend into the mediastinum compressing the left pulmonary vein consistent with progressive lung carcinoma. This mass on image 152 measures 68 x 101 mm compared to 72 x 81 mm previously. Pulmonary artery branches do course through this soft tissue mass without evidence of pulmonary embolism. On image 120 series 5 there is a left hilar node indenting the left main  pulmonary artery measuring 17 mm in diameter. Anterior mediastinal nodes also are present which appear larger. On image 102 an anterior mediastinal prevascular node measures 14 mm compared to 11 mm previously. These nodes are worrisome for metastatic involvement of mediastinal and hilar lymph nodes. There also is a cluster of nodes precarinal to the right of midline measuring 18 mm, much larger than on the prior CT Lungs/Pleura: The volume of the moderate to large left pleural effusion has increased considerably in the interval resulting in more volume loss of the left upper lobe and left lower lobe. There is truncation of the bronchus to the left upper lobe with some air reaching the lingula and a portion of the left lower lobe. The right lung appears relatively well aerated. No definite right effusion is seen. Upper Abdomen: The liver is unremarkable with no evidence of a hepatic metastases on this exam. Surgical clips are noted from prior cholecystectomy. The pancreas is unremarkable although somewhat fatty infiltrated. Splenic artery calcifications are present. Musculoskeletal: There are degenerative changes throughout the thoracic spine. Mild compression deformities of several adjacent midthoracic vertebra appear stable. Review of the MIP images confirms the above findings. IMPRESSION: 1. 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 2. Progression of adenopathy involving the left hilum and mediastinum. 3. Increasing size of left pleural effusion. 4. No acute pulmonary embolism is seen. Electronically Signed   By: Ivar Drape M.D.   On: 10/23/2017 16:45   Dg Chest Port 1 View  Result Date: 10/24/2017 CLINICAL DATA:  Shortness of breath.  Difficulty breathing. EXAM: PORTABLE CHEST 1 VIEW COMPARISON:  CT 10/23/2017.  Chest x-ray 10/23/2017. FINDINGS: Stable cardiomegaly. Stable opacification left lung base and prominent left-sided pleural effusion. Reference is made to prior CT of  10/23/2017. No pneumothorax. Left shoulder replacement. IMPRESSION: 1. Stable opacification of the left lung base and stable prominent left-sided pleural effusion. 2.  Stable cardiomegaly. Chest is unchanged from prior exam. Reference is made to prior CT report 10/23/2017. Electronically Signed   By: Marcello Moores  Register   On: 10/24/2017 09:59   Dg Chest Port 1 View  Result Date: 10/17/2017 CLINICAL DATA:  Hemoptysis status post bronchoscopy and lung biopsy. EXAM: PORTABLE CHEST 1 VIEW COMPARISON:  Chest x-ray dated October 09, 2017. FINDINGS: Stable cardiomegaly. Interval decrease in size of now small left pleural effusion. Persistent consolidation in the left lower lobe. No pneumothorax. No acute osseous abnormality. IMPRESSION: 1. Interval decrease in size of now small left pleural effusion. Persistent left lower lobe pneumonia. Electronically Signed   By: Titus Dubin M.D.   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 Request made for diagnostic and therapeutic left thoracentesis. EXAM: ULTRASOUND GUIDED DIAGNOSTIC AND THERAPEUTIC LEFT THORACENTESIS MEDICATIONS: None COMPLICATIONS: None immediate. PROCEDURE: An ultrasound guided thoracentesis was thoroughly discussed with the patient and questions answered. The benefits, risks, alternatives and complications were also discussed. The patient understands and wishes to proceed with the procedure. Written consent was obtained. Ultrasound was performed to localize and mark an adequate pocket of fluid in the left chest. The area was then prepped and draped in the normal sterile fashion. 1% Lidocaine was used for local anesthesia. Under ultrasound guidance a 6 Fr Safe-T-Centesis catheter was introduced. Thoracentesis was performed. The catheter was removed  and a dressing applied. FINDINGS: A total of approximately 1.3 liters of hazy, yellow fluid was removed. Samples were sent to the laboratory as requested by the clinical team. IMPRESSION: Successful ultrasound guided diagnostic and therapeutic left thoracentesis yielding 1.3 liters of pleural fluid. Read by: Rowe Robert, PA-C Electronically Signed   By: Sandi Mariscal M.D.   On: 10/24/2017 10:54   US Thoracentesis Asp Pleural Space W/img Guide  Result Date: 10/09/2017 INDICATION: Patient with history of sarcoidosis, coronary artery disease, COPD, CHF, pneumonia, dyspnea, left pleural effusion. Request made for diagnostic and therapeutic left thoracentesis. EXAM: ULTRASOUND GUIDED DIAGNOSTIC AND THERAPEUTIC LEFT THORACENTESIS MEDICATIONS: None COMPLICATIONS: None immediate. PROCEDURE: An ultrasound guided thoracentesis was thoroughly discussed with the patient and questions answered. The benefits, risks, alternatives and complications were also discussed. The patient understands and wishes to proceed with the procedure. Written consent was obtained. Ultrasound was performed to localize and mark an adequate pocket of fluid in the left chest. The area was then prepped and draped in the normal sterile fashion. 1% Lidocaine was used for local anesthesia. Under ultrasound guidance a 6 Fr Safe-T-Centesis catheter was introduced. Thoracentesis was performed. The catheter was removed and a dressing applied. FINDINGS: A total of approximately 1.2 liters of hazy, yellow fluid was removed. Samples were sent to the laboratory as requested by the clinical team. IMPRESSION: Successful ultrasound guided diagnostic and therapeutic left thoracentesis yielding 1.2 liters of pleural fluid. Read by: Rowe Robert, PA-C Electronically Signed   By: Suzy Bouchard M.D.   On: 10/09/2017 17:40   Dg C-arm Bronchoscopy  Result Date: 10/17/2017 C-ARM BRONCHOSCOPY: Fluoroscopy was utilized by the requesting physician.  No radiographic  interpretation.     Discharge Exam: Vitals:   10/24/17 1249 10/24/17 1411  BP: 134/84   Pulse: 87   Resp: 16   Temp:    SpO2: 96% 100%   Vitals:   10/24/17 1025 10/24/17 1033 10/24/17 1249 10/24/17 1411  BP: 97/66 (!) 91/57 134/84   Pulse:   87   Resp:   16   Temp:      TempSrc:      SpO2:   96% 100%  Weight:      Height:        General: NAD  Cardiovascular: RRR, S1/S2 +, no rubs, no gallops Respiratory: Good air entry, significant improvement on  Abdominal: Soft, NT, ND, bowel sounds + Extremities: no edema  The results of significant diagnostics from this hospitalization (including imaging, microbiology, ancillary and laboratory) are listed below for reference.     Microbiology: Recent Results (from the past 240 hour(s))  Blood Culture (routine x 2)  Status: None (Preliminary result)   Collection Time: 10/23/17  3:19 PM  Result Value Ref Range Status   Specimen Description   Final    BLOOD RIGHT ANTECUBITAL Performed at [facility] [address], [place], [place] [address]    Special Requests   Final    BOTTLES DRAWN AEROBIC AND ANAEROBIC Blood Culture adequate volume Performed at [facility] [address] [place], [place] [address]    Culture   Final    NO GROWTH < 24 HOURS Performed at [facility] [address], [place], [place] [address]    Report Status PENDING  Incomplete  Blood Culture (routine x 2)     Status: None (Preliminary result)   Collection Time: 10/23/17  3:29 PM  Result Value Ref Range Status   Specimen Description   Final    BLOOD LEFT ANTECUBITAL Performed at [facility] [address], [place], [place] [address]    Special Requests   Final    BOTTLES DRAWN AEROBIC AND ANAEROBIC Blood Culture results may not be optimal due to an excessive volume of blood received in culture bottles Performed at [facility] [address],  [place], [place] [address]    Culture   Final    NO GROWTH < 24 HOURS Performed at [facility] [address] [place], [place] [address]    Report Status PENDING  Incomplete     Labs: BNP (last 3 results) Recent Labs    09/02/17 1315 10/09/17 1150 10/23/17 1513  BNP 184.2* 106.1* 947.0*   Basic Metabolic Panel: Recent Labs  Lab 10/23/17 1513 10/23/17 1526 10/24/17 0521  NA 136 133* 135  K 3.8 3.6 3.6  CL 99* 96* 103  CO2 25  --  23  GLUCOSE 171* 146* 117*  BUN 14 13 13   CREATININE 0.99 0.90 0.88  CALCIUM 9.2  --  8.8*   Liver Function Tests: Recent Labs  Lab 10/23/17 1513 10/24/17 0521  AST 13* 13*  ALT 13* 13*  ALKPHOS 67 58  BILITOT 0.8 0.7  PROT 6.4* 5.9*  ALBUMIN 2.6* 2.4*   No results for input(s): LIPASE, AMYLASE in the last 168 hours. No results for input(s): AMMONIA in the last 168 hours. CBC: Recent Labs  Lab 10/23/17 1513 10/23/17 1526 10/24/17 0521  WBC 18.1*  --  13.8*  NEUTROABS 15.6*  --   --   HGB 12.7* 12.6* 10.9*  HCT 38.3* 37.0* 33.4*  MCV 83.8  --  83.5  PLT 420*  --  342   Cardiac Enzymes: No results for input(s): CKTOTAL, CKMB, CKMBINDEX, TROPONINI in the last 168 hours. BNP: Invalid input(s): POCBNP CBG: No results for input(s): GLUCAP in the last 168 hours. D-Dimer No results for input(s): DDIMER in the last 72 hours. Hgb A1c No results for input(s): HGBA1C in the last 72 hours. Lipid Profile No results for input(s): CHOL, HDL, LDLCALC, TRIG, CHOLHDL, LDLDIRECT in the last 72 hours. Thyroid function studies No results for input(s): TSH, T4TOTAL, T3FREE, THYROIDAB in the last 72 hours.  Invalid input(s): FREET3 Anemia work up No results for input(s): VITAMINB12, FOLATE, FERRITIN, TIBC, IRON, RETICCTPCT in the last 72 hours. Urinalysis    Component Value Date/Time   COLORURINE YELLOW 10/23/2017 1830   APPEARANCEUR CLEAR 10/23/2017 1830   LABSPEC >1.046 (H) 10/23/2017 1830   PHURINE 6.0 10/23/2017 1830    GLUCOSEU NEGATIVE 10/23/2017 1830   HGBUR NEGATIVE 10/23/2017  [place] 10/23/2017 [place] 10/23/2017 1830   PROTEINUR NEGATIVE 10/23/2017 1830   UROBILINOGEN 0.2 08/19/2014 1909   NITRITE NEGATIVE 10/23/2017 1830   LEUKOCYTESUR TRACE (A) 10/23/2017 1830   Sepsis Labs Invalid input(s): PROCALCITONIN,  WBC,  LACTICIDVEN Microbiology Recent Results (from the past 240 hour(s))  Blood Culture (routine x 2)     Status: None (Preliminary result)   Collection Time: 10/23/17  3:19 PM  Result Value Ref Range Status   Specimen Description   Final    BLOOD RIGHT ANTECUBITAL Performed at [facility], [place] [address], [place], [place] [address]    Special Requests   Final    BOTTLES DRAWN AEROBIC AND ANAEROBIC Blood Culture adequate volume Performed at [facility] [address], [place], [place] [address]    Culture   Final    NO GROWTH < 24 HOURS Performed at [facility] [address] [place], [place] [address]    Report Status PENDING  Incomplete  Blood Culture (routine x 2)     Status: None (Preliminary result)   Collection Time: 10/23/17  3:29 PM  Result Value Ref Range Status   Specimen Description   Final    BLOOD LEFT ANTECUBITAL Performed at [facility] [address] [place], [place] [address]    Special Requests   Final    BOTTLES DRAWN AEROBIC AND ANAEROBIC Blood Culture results may not be optimal due to an excessive volume of blood received in culture bottles Performed at [facility] [address], [place], [place] [address]    Culture   Final    NO GROWTH < 24 HOURS Performed at [facility] [address], [place],  [address]    Report Status PENDING  Incomplete    Time coordinating discharge: 32 minutes  SIGNED:  Chipper Oman, MD  [facility] 10/24/2017, 3:38 PM  Pager please text page via  [email or web address]  Note  - This record has been created using [employer]. Chart creation errors have been sought, but may not always have been located. Such creation errors do not reflect on the standard of medical care.

## 2017-10-28 LAB — CULTURE, BLOOD (ROUTINE X 2)
CULTURE: NO GROWTH
Culture: NO GROWTH
Special Requests: ADEQUATE

## 2017-11-03 ENCOUNTER — Other Ambulatory Visit: Payer: Self-pay | Admitting: Medical Oncology

## 2017-11-03 DIAGNOSIS — C3430 Malignant neoplasm of lower lobe, unspecified bronchus or lung: Secondary | ICD-10-CM

## 2017-11-04 ENCOUNTER — Inpatient Hospital Stay: Payer: Medicare Other

## 2017-11-04 ENCOUNTER — Inpatient Hospital Stay: Payer: Medicare Other | Attending: Internal Medicine | Admitting: Internal Medicine

## 2017-11-04 ENCOUNTER — Encounter: Payer: Self-pay | Admitting: Internal Medicine

## 2017-11-04 VITALS — BP 86/34 | HR 96 | Resp 18 | Ht 71.0 in | Wt 244.6 lb

## 2017-11-04 DIAGNOSIS — G4730 Sleep apnea, unspecified: Secondary | ICD-10-CM | POA: Diagnosis not present

## 2017-11-04 DIAGNOSIS — J90 Pleural effusion, not elsewhere classified: Secondary | ICD-10-CM

## 2017-11-04 DIAGNOSIS — Z5111 Encounter for antineoplastic chemotherapy: Secondary | ICD-10-CM | POA: Insufficient documentation

## 2017-11-04 DIAGNOSIS — E785 Hyperlipidemia, unspecified: Secondary | ICD-10-CM

## 2017-11-04 DIAGNOSIS — I4891 Unspecified atrial fibrillation: Secondary | ICD-10-CM

## 2017-11-04 DIAGNOSIS — F4310 Post-traumatic stress disorder, unspecified: Secondary | ICD-10-CM | POA: Diagnosis not present

## 2017-11-04 DIAGNOSIS — F419 Anxiety disorder, unspecified: Secondary | ICD-10-CM | POA: Diagnosis not present

## 2017-11-04 DIAGNOSIS — C3492 Malignant neoplasm of unspecified part of left bronchus or lung: Secondary | ICD-10-CM | POA: Insufficient documentation

## 2017-11-04 DIAGNOSIS — Z510 Encounter for antineoplastic radiation therapy: Secondary | ICD-10-CM | POA: Diagnosis not present

## 2017-11-04 DIAGNOSIS — Z5112 Encounter for antineoplastic immunotherapy: Secondary | ICD-10-CM | POA: Diagnosis not present

## 2017-11-04 DIAGNOSIS — R5382 Chronic fatigue, unspecified: Secondary | ICD-10-CM

## 2017-11-04 DIAGNOSIS — Z79899 Other long term (current) drug therapy: Secondary | ICD-10-CM

## 2017-11-04 DIAGNOSIS — K219 Gastro-esophageal reflux disease without esophagitis: Secondary | ICD-10-CM

## 2017-11-04 DIAGNOSIS — J449 Chronic obstructive pulmonary disease, unspecified: Secondary | ICD-10-CM | POA: Diagnosis not present

## 2017-11-04 DIAGNOSIS — C3430 Malignant neoplasm of lower lobe, unspecified bronchus or lung: Secondary | ICD-10-CM

## 2017-11-04 DIAGNOSIS — I2510 Atherosclerotic heart disease of native coronary artery without angina pectoris: Secondary | ICD-10-CM

## 2017-11-04 DIAGNOSIS — D869 Sarcoidosis, unspecified: Secondary | ICD-10-CM | POA: Diagnosis not present

## 2017-11-04 DIAGNOSIS — R32 Unspecified urinary incontinence: Secondary | ICD-10-CM | POA: Diagnosis not present

## 2017-11-04 DIAGNOSIS — C3490 Malignant neoplasm of unspecified part of unspecified bronchus or lung: Secondary | ICD-10-CM

## 2017-11-04 DIAGNOSIS — Z87891 Personal history of nicotine dependence: Secondary | ICD-10-CM | POA: Diagnosis not present

## 2017-11-04 DIAGNOSIS — Z7689 Persons encountering health services in other specified circumstances: Secondary | ICD-10-CM | POA: Insufficient documentation

## 2017-11-04 DIAGNOSIS — C3411 Malignant neoplasm of upper lobe, right bronchus or lung: Secondary | ICD-10-CM

## 2017-11-04 DIAGNOSIS — R197 Diarrhea, unspecified: Secondary | ICD-10-CM | POA: Insufficient documentation

## 2017-11-04 DIAGNOSIS — Z7189 Other specified counseling: Secondary | ICD-10-CM

## 2017-11-04 DIAGNOSIS — I509 Heart failure, unspecified: Secondary | ICD-10-CM | POA: Diagnosis not present

## 2017-11-04 LAB — CBC WITH DIFFERENTIAL (CANCER CENTER ONLY)
Basophils Absolute: 0.1 10*3/uL (ref 0.0–0.1)
Basophils Relative: 1 %
EOS PCT: 2 %
Eosinophils Absolute: 0.3 10*3/uL (ref 0.0–0.5)
HEMATOCRIT: 35.3 % — AB (ref 38.4–49.9)
Hemoglobin: 11.7 g/dL — ABNORMAL LOW (ref 13.0–17.1)
LYMPHS ABS: 0.8 10*3/uL — AB (ref 0.9–3.3)
LYMPHS PCT: 5 %
MCH: 26.7 pg — AB (ref 27.2–33.4)
MCHC: 33.3 g/dL (ref 32.0–36.0)
MCV: 80.4 fL (ref 79.3–98.0)
MONO ABS: 1 10*3/uL — AB (ref 0.1–0.9)
MONOS PCT: 7 %
NEUTROS ABS: 12.6 10*3/uL — AB (ref 1.5–6.5)
Neutrophils Relative %: 85 %
Platelet Count: 516 10*3/uL — ABNORMAL HIGH (ref 140–400)
RBC: 4.39 MIL/uL (ref 4.20–5.82)
RDW: 15.5 % — AB (ref 11.0–14.6)
WBC Count: 14.8 10*3/uL — ABNORMAL HIGH (ref 4.0–10.3)

## 2017-11-04 LAB — CMP (CANCER CENTER ONLY)
ALT: 11 U/L (ref 0–55)
AST: 13 U/L (ref 5–34)
Albumin: 2.5 g/dL — ABNORMAL LOW (ref 3.5–5.0)
Alkaline Phosphatase: 83 U/L (ref 40–150)
Anion gap: 8 (ref 3–11)
BILIRUBIN TOTAL: 0.6 mg/dL (ref 0.2–1.2)
BUN: 14 mg/dL (ref 7–26)
CO2: 29 mmol/L (ref 22–29)
Calcium: 9.6 mg/dL (ref 8.4–10.4)
Chloride: 97 mmol/L — ABNORMAL LOW (ref 98–109)
Creatinine: 0.97 mg/dL (ref 0.70–1.30)
Glucose, Bld: 135 mg/dL (ref 70–140)
POTASSIUM: 4 mmol/L (ref 3.5–5.1)
Sodium: 134 mmol/L — ABNORMAL LOW (ref 136–145)
TOTAL PROTEIN: 6.5 g/dL (ref 6.4–8.3)

## 2017-11-04 MED ORDER — PROCHLORPERAZINE MALEATE 10 MG PO TABS: 10.0000 mg | ORAL_TABLET | Freq: Four times a day (QID) | ORAL | 0 refills | Status: AC | PRN

## 2017-11-04 NOTE — Progress Notes (Signed)
[place] [phone]   [phone]  CONSULT NOTE  REFERRING PHYSICIAN:Dr. Vineet Sood  REASON FOR CONSULTATION:  77 years old white male recently diagnosed with lung cancer.  HPI Travis Ferguson is a 77 y.o. male with past medical history significant for multiple medical problems including congestive heart failure, coronary artery disease, and anxiety, chronic back pain, COPD, GERD, PTSD, dyslipidemia, sarcoidosis, subdural hematoma, atrial fibrillation as well as history for smoking for around 20 years but quit in 2010.  The patient has been complaining of cough chest congestion and shortness of breath for few months.  It was getting worse and he was seen by his primary care physician and chest x-ray was performed on September 02, 2017 and that showed development of left effusion and left lower lobe volume loss/pneumonia.  On the same day the patient had CT scan of the abdomen for evaluation of urinary tract infection and it showed large left lower lobe consolidation consistent with pneumonia.  There was also a small left pleural effusion.  His shortness of breath was getting worse and chest x-ray on 10/09/2017 showed extensive left lower lobe pneumonia with volume loss and associated pleural effusion.  On 10/09/2017 the patient underwent ultrasound-guided diagnostic and therapeutic left thoracentesis with drainage of 1.2 L of pleural fluid.  The final cytology was negative for malignancy.  CT scan of the chest without contrast on 10/10/2017 showed left pleural effusion.  There was consolidation and volume loss in the left lower lobe with abnormal soft tissue opacity at the left pulmonary hilum measuring approximately 8.1 x 7.2 x 6.7 cm in size associated with occlusion of the distal left mainstem bronchus and proximal left upper lobe/left lower lobe bronchi highly suspicious for malignancy invading the mediastinum.  There was also postobstructive pneumonitis involving  the left lower lobe with associated left lower lobe bronchiectasis and a new left pleural effusion. The patient was seen by Dr. Halford Chessman and on October 17, 2017 he underwent bronchoscopy with biopsy of left upper lobe lung mass. The final pathology [id number]) showed poorly differentiated non-small cell carcinoma.  The immunohistochemistry showed the tumor is positive with cytokeratin 5/6 and negative with TTF-1 1 consistent with squamous cell carcinoma. The patient underwent another left-sided thoracentesis on 10/24/2017 with drainage of 1.3 L of pleural fluid and the cytology was negative for malignancy. Dr. Halford Chessman kindly referred the patient to me today for further evaluation and recommendation regarding treatment of his condition. When seen today the patient is anxious about his recent diagnosis.  He also continues to complain of left shoulder pain as well as shortness of breath at baseline increased with exertion and he is currently on home oxygen.  He also has cough with no hemoptysis.  He lost around 20 pounds in the last few months.  He denied having any headache or visual changes.  He has no nausea, vomiting, diarrhea or constipation. Family history significant for mother died from pneumonia at age 42, father died from heart disease at age 74 and paternal grandmother had stomach cancer. The patient is married and has 2 children.  He was accompanied today by his wife, Letta Median, his son Herbie Baltimore and his brother Linna Hoff.  The patient has another son named Ronalee Belts.  He is a retired Agricultural consultant.  He has a history of his smoking pipes for around 20 years and quit in 2010.  He has no history of alcohol or drug abuse.  HPI  Past Medical History:  Diagnosis Date  .  Acute on chronic combined systolic and diastolic CHF (congestive heart failure) ([place]) 02/24/2015  . Anxiety   . Arthritis    "about q joint I've got" (09/22/2015)  . Atherosclerosis   . CAP (community acquired pneumonia) 02/24/2015  . Cervical disc disease   .  Chronic lower back pain    chronic back pain/under pain management  . Colon polyps 08/02/2014   Tubular adenoma x 3, Hyperplastic-1  . COPD (chronic obstructive pulmonary disease) ([place])    a. Former Airline pilot, also smoked a pipe.  . Coronary artery calcification seen on CAT scan   . Depression   . Dyslipidemia   . Dyspnea    chronic  . GERD (gastroesophageal reflux disease)   . Hiatal hernia    sensation problem ("right lateral side hip to knee").  . History of blood transfusion "numerous"  . History of oxygen administration    @ 2 l/m nasally at bedtime.(09/22/2015)  . Hypertension   . Inguinal hernia    right side  . Migraine    "years since I've had one" (09/22/2015)  . OSA (obstructive sleep apnea)    does not use CPAP; "just oxygen" (09/22/2015)  . PTSD (post-traumatic stress disorder)   . Rhinitis   . Sarcoidosis   . Sensation problem    right side-lateral hip to knee" decreased sensation and tingling feeling" -has informed Dr. Joylene Draft, Dr. Henrene Pastor, Dr. Lucia Gaskins of this.  . Sepsis ([place])   . Steroid-induced hyperglycemia 02/25/2015  . Subdural hematoma ([place]) 11/10   a. 2010 s/p surgery - diagnosed several weeks after a fall.    Past Surgical History:  Procedure Laterality Date  . BACK SURGERY    . BRAIN SURGERY  04/2009   right frontal"hemorrhage evacuation from fall injury"  . CARDIAC CATHETERIZATION N/A 01/23/2015   Procedure: Right/Left Heart Cath and Coronary Angiography;  Surgeon: Lorretta Harp, MD;  Location: [facility];  Service: Cardiovascular;  Laterality: N/A;  . CHOLECYSTECTOMY N/A 11/08/2014   Procedure: LAPAROSCOPIC CHOLECYSTECTOMY ;  Surgeon: Alphonsa Overall, MD;  Location: [facility];  Service: General;  Laterality: N/A;  . ERCP N/A 11/07/2014   Procedure: ENDOSCOPIC RETROGRADE CHOLANGIOPANCREATOGRAPHY (ERCP);  Surgeon: Irene Shipper, MD;  Location: [facility];  Service: Endoscopy;  Laterality: N/A;  . GALLBLADDER SURGERY  09/2014   drain tube placed  .  INGUINAL HERNIA REPAIR Left   . KNEE ARTHROSCOPY Right   . LUMBAR LAMINECTOMY/DECOMPRESSION MICRODISCECTOMY Right 12/15/2012   Procedure: LUMBAR LAMINECTOMY/DECOMPRESSION MICRODISCECTOMY 1 LEVEL;  Surgeon: Elaina Hoops, MD;  Location: [facility];  Service: Neurosurgery;  Laterality: Right;  Right Lumbar four-five laminectomy/foraminotomy  . REVERSE SHOULDER ARTHROPLASTY Left 09/22/2015   Procedure: LEFT REVERSE TOTAL SHOULDER ARTHROPLASTY;  Surgeon: Netta Cedars, MD;  Location: [facility];  Service: Orthopedics;  Laterality: Left;  . REVERSE TOTAL SHOULDER ARTHROPLASTY Left 09/22/2015  . VIDEO BRONCHOSCOPY  07/28/2012   Procedure: VIDEO BRONCHOSCOPY WITH FLUORO;  Surgeon: Chesley Mires, MD;  Location: [facility];  Service: Cardiopulmonary;  Laterality: Bilateral;  . VIDEO BRONCHOSCOPY Bilateral 10/17/2017   Procedure: VIDEO BRONCHOSCOPY WITH FLUORO;  Surgeon: Chesley Mires, MD;  Location: [facility];  Service: Cardiopulmonary;  Laterality: Bilateral;    Family History  Problem Relation Age of Onset  . Heart disease Mother   . Heart disease Father   . Colon polyps Brother   . Congestive Heart Failure Brother   . Coronary artery disease Unknown   . Stomach cancer Paternal Grandmother   . Colon cancer Neg  Hx   . Pancreatic cancer Neg Hx   . Rectal cancer Neg Hx   . Esophageal cancer Neg Hx     Social History Social History   Tobacco Use  . Smoking status: Former Smoker    Packs/day: 0.00    Years: 30.00    Pack years: 0.00    Types: Pipe    Last attempt to quit: 06/24/1998    Years since quitting: 19.3  . Smokeless tobacco: Never Used  Substance Use Topics  . Alcohol use: No  . Drug use: No    Allergies  Allergen Reactions  . Statins Other (See Comments)    Muscle aches - tolerating Vytorin    Current Outpatient Medications  Medication Sig Dispense Refill  . acetaminophen (TYLENOL) 325 MG tablet Take 650 mg by mouth every 6 (six) hours as needed for mild pain.    [name] albuterol  (PROVENTIL HFA;VENTOLIN HFA) 108 (90 Base) MCG/ACT inhaler 1-2 PUFFS EVERY 4-6 HOURS AS NEEDED FOR SHORTNESS OF BREATH 18 g 1  . amiodarone (PACERONE) 100 MG tablet Take 1 tablet (100 mg total) by mouth daily. 30 tablet 6  . apixaban (ELIQUIS) 5 MG TABS tablet Take 1 tablet (5 mg total) by mouth 2 (two) times daily. 60 tablet 0  . atorvastatin (LIPITOR) 20 MG tablet Take 1 tablet (20 mg total) by mouth daily at 6 PM. 30 tablet 0  . benzonatate (TESSALON) 200 MG capsule Take 1 capsule (200 mg total) by mouth 3 (three) times daily as needed for cough. 30 capsule 1  . budesonide-formoterol (SYMBICORT) 160-4.5 MCG/ACT inhaler Inhale 2 puffs into the lungs 2 (two) times daily. 1 Inhaler 0  . chlorhexidine (PERIDEX) 0.12 % solution chlorhexidine gluconate 0.12 % mouthwash    . DULoxetine (CYMBALTA) 30 MG capsule Take 30 mg by mouth daily.    [name] ENTRESTO 24-26 MG TAKE 1 TABLET BY MOUTH 2 (TWO) TIMES DAILY. 180 tablet 1  . finasteride (PROSCAR) 5 MG tablet Take 5 mg by mouth daily.     [name] FLUoxetine (PROZAC) 40 MG capsule Take 40 mg by mouth daily with breakfast.     . furosemide (LASIX) 20 MG tablet Take 1 tablet (20 mg total) by mouth 2 (two) times daily. 60 tablet 0  . guaiFENesin (MUCINEX) 600 MG 12 hr tablet Take 1 tablet (600 mg total) by mouth 2 (two) times daily. 60 tablet 2  . HYDROcodone-acetaminophen (NORCO/VICODIN) 5-325 MG tablet Take 1 tablet by mouth every 8 (eight) hours as needed for moderate pain.     [name] ipratropium (ATROVENT) 0.02 % nebulizer solution Take 2.5 mLs (0.5 mg total) by nebulization 4 (four) times daily. (Patient not taking: Reported on 10/23/2017) 300 mL 12  . levalbuterol (XOPENEX) 1.25 MG/0.5ML nebulizer solution Take 1.25 mg by nebulization every 8 (eight) hours as needed for wheezing or shortness of breath. (Patient not taking: Reported on 10/23/2017) 1 each 0  . metoprolol tartrate (LOPRESSOR) 25 MG tablet TAKE 1 TABLET (25 MG TOTAL) BY MOUTH 2 (TWO) TIMES DAILY. 90 tablet 1  .  OXYGEN Inhale 2 L/min into the lungs at bedtime.    . sodium chloride (OCEAN) 0.65 % SOLN nasal spray Place 1-2 sprays into both nostrils daily as needed for congestion.     No current facility-administered medications for this visit.     Review of Systems  Constitutional: positive for fatigue and weight loss Eyes: negative Ears, nose, mouth, throat, and face: negative Respiratory: positive for cough, dyspnea on exertion  and wheezing Cardiovascular: negative Gastrointestinal: negative Genitourinary:negative Integument/breast: negative Hematologic/lymphatic: negative Musculoskeletal:negative Neurological: negative Behavioral/Psych: negative Endocrine: negative Allergic/Immunologic: negative  Physical Exam  JSE:GBTDV, healthy, no distress, well nourished, well developed and anxious SKIN: skin color, texture, turgor are normal, no rashes or significant lesions HEAD: Normocephalic, No masses, lesions, tenderness or abnormalities EYES: normal, PERRLA, Conjunctiva are pink and non-injected EARS: External ears normal, Canals clear OROPHARYNX:no exudate, no erythema and lips, buccal mucosa, and tongue normal  NECK: supple, no adenopathy, no JVD LYMPH:  no palpable lymphadenopathy, no hepatosplenomegaly LUNGS: decreased breath sounds, expiratory wheezes bilaterally HEART: regular rate & rhythm, no murmurs and no gallops ABDOMEN:abdomen soft, non-tender, normal bowel sounds and no masses or organomegaly BACK: Back symmetric, no curvature., No CVA tenderness EXTREMITIES:no joint deformities, effusion, or inflammation, no edema  NEURO: alert & oriented x 3 with fluent speech, no focal motor/sensory deficits  PERFORMANCE STATUS: ECOG 1  LABORATORY DATA: Lab Results  Component Value Date   WBC 14.8 (H) 11/04/2017   HGB 11.7 (L) 11/04/2017   HCT 35.3 (L) 11/04/2017   MCV 80.4 11/04/2017   PLT 516 (H) 11/04/2017      Chemistry      Component Value Date/Time   NA 134 (L)  11/04/2017 1351   K 4.0 11/04/2017 1351   CL 97 (L) 11/04/2017 1351   CO2 29 11/04/2017 1351   BUN 14 11/04/2017 1351   CREATININE 0.97 11/04/2017 1351   CREATININE 1.00 05/29/2015 1516      Component Value Date/Time   CALCIUM 9.6 11/04/2017 1351   ALKPHOS 83 11/04/2017 1351   AST 13 11/04/2017 1351   ALT 11 11/04/2017 1351   BILITOT 0.6 11/04/2017 1351       RADIOGRAPHIC STUDIES: Dg Chest 1 View  Result Date: 10/24/2017 CLINICAL DATA:  Shortness of breath.  Status post thoracentesis EXAM: CHEST  1 VIEW COMPARISON:  Study obtained earlier in the day FINDINGS: Left pleural effusion is smaller following thoracentesis. No pneumothorax. There remains consolidation in the left mid and lower lung zones with small left pleural effusion. Right lung is somewhat hyperexpanded but clear. Heart is upper normal in size with pulmonary vascularity within normal limits. No adenopathy. There is aortic atherosclerosis. There is a total shoulder replacement on the left. IMPRESSION: Left pleural effusion smaller following thoracentesis. No pneumothorax. Persistent left lower lobe consolidation with volume loss. Small residual left pleural effusion. Right lung clear. Stable cardiac silhouette. Electronically Signed   By: Lowella Grip III M.D.   On: 10/24/2017 11:08   Dg Chest 1 View  Result Date: 10/09/2017 CLINICAL DATA:  77 y/o  M; left thoracentesis. EXAM: CHEST  1 VIEW COMPARISON:  10/09/2017 chest radiograph FINDINGS: Stable cardiomegaly. Large left-sided pleural effusion with opacity of left mid and lower lung zones. A no appreciable pneumothorax. Clear right lung. Left shoulder reverse hemiarthroplasty is partially visualized. No acute osseous abnormality identified. IMPRESSION: Large left pleural effusion with opacification of left mid and lower lung zones. No appreciable pneumothorax. Electronically Signed   By: Kristine Garbe M.D.   On: 10/09/2017 18:13   Dg Chest 2 View  Result Date:  10/23/2017 CLINICAL DATA:  Shortness of breath. Recently diagnosed with lung cancer. EXAM: CHEST - 2 VIEW COMPARISON:  10/17/2017 and 10/10/2017 CT FINDINGS: There is significant opacity in the LEFT mid lung zone and LEFT lung base, increased compared with most recent comparison. RIGHT lung is unremarkable. LEFT pleural effusion. Degenerative changes are seen in the thoracic spine. IMPRESSION: Progressive opacity  in the LEFT lung, consistent with infectious process/atelectasis, tumor progression, or pleural effusion. Electronically Signed   By: Nolon Nations M.D.   On: 10/23/2017 15:06   Dg Chest 2 View  Result Date: 10/09/2017 CLINICAL DATA:  Shortness of breath an weakness today. EXAM: CHEST - 2 VIEW COMPARISON:  09/08/2017 FINDINGS: The right chest remains clear. There is extensive pneumonia throughout the left lower lobe with volume loss. Probable associated effusion on the left. IMPRESSION: Extensive left lower lobe pneumonia with volume loss. Probable associated pleural fluid. Electronically Signed   By: Nelson Chimes M.D.   On: 10/09/2017 11:24   Ct Chest Wo Contrast  Result Date: 10/10/2017 CLINICAL DATA:  Pneumonia; history acute on chronic combined systolic and diastolic CHF, COPD, coronary artery disease, hypertension, sarcoidosis, former smoker EXAM: CT CHEST WITHOUT CONTRAST TECHNIQUE: Multidetector CT imaging of the chest was performed following the standard protocol without IV contrast. Sagittal and coronal MPR images reconstructed from axial data set. COMPARISON:  Chest radiograph 10/09/2017, CT chest 07/12/2016 FINDINGS: Cardiovascular: Atherosclerotic calcifications aorta, proximal great vessels, and coronary arteries. No pericardial effusion. 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 Mediastinum/Nodes: Numerous calcified mediastinal and hilar lymph nodes again identified consistent with history of sarcoidosis, unchanged. No additional thoracic  adenopathy. Esophagus unremarkable. Base of cervical region normal appearance. Lungs/Pleura: LEFT pleural effusion. Consolidation and volume loss in LEFT lower lobe. Calcified pulmonary granulomata. Central peribronchial thickening. Hyperinflated/lucent LEFT upper lobe. Beaded appearance of airways in LEFT lower lobe consistent with bronchiectasis. Diffuse narrowing of the LEFT mainstem bronchus with occlusion of LEFT upper lobe and LEFT lower lobe bronchi at their origins versus previous exam. Large area of soft tissue opacity at the LEFT pulmonary hilum highly concerning for tumor, contiguous extending into mediastinum, associated with occlusion of the LEFT upper lobe and LEFT lower lobe bronchi and marked compression of the distal LEFT mainstem bronchus. Area of soft tissue opacity measures estimated 8.1 x 7.2 x 6.7 cm. Finding is concerning for malignancy invading the hilum and mediastinum. Upper Abdomen: Small cystic lesion with calcification at the RIGHT lobe of the liver 19 x 13 mm image 141. Duodenal diverticulum. Remaining visualized upper abdomen unremarkable. Musculoskeletal: Diffuse osseous demineralization. IMPRESSION: Chronic changes of sarcoidosis. Abnormal soft tissue opacity at the LEFT pulmonary hilum approximately 8.1 x 7.2 x 6.7 cm in size associated with occlusion of the distal LEFT mainstem bronchus and proximal LEFT upper lobe/LEFT lower lobe bronchi highly suspicious for malignancy invading mediastinum; bronchoscopic evaluation recommended. Postobstructive pneumonitis involving the LEFT lower lobe with associated LEFT lower lobe bronchiectasis and new LEFT pleural effusion. Mild aneurysmal dilatation of the ascending thoracic aorta 4.2 cm greatest size, recommendation below. Recommend annual imaging followup by CTA or MRA. This recommendation follows 2010 ACCF/AHA/AATS/ACR/ASA/SCA/SCAI/SIR/STS/SVM Guidelines for the Diagnosis and Management of Patients with Thoracic Aortic Disease.  Circulation. 2010; 121: O709-G283 Aortic Atherosclerosis (ICD10-I70.0). Electronically Signed   By: Lavonia Dana M.D.   On: 10/10/2017 17:19   Ct Angio Chest Pe W And/or Wo Contrast  Result Date: 10/23/2017 CLINICAL DATA:  History of lung carcinoma, short of breath today, lung sounds absent EXAM: CT ANGIOGRAPHY CHEST WITH CONTRAST TECHNIQUE: Multidetector CT imaging of the chest was performed using the standard protocol during bolus administration of intravenous contrast. Multiplanar CT image reconstructions and MIPs were obtained to evaluate the vascular anatomy. CONTRAST:  15mL ISOVUE-370 IOPAMIDOL (ISOVUE-370) INJECTION 76% COMPARISON:  Chest x-ray of 10/23/2016 and CT chest 10/10/2017 FINDINGS: Cardiovascular: Moderate cardiomegaly is stable.  Diffuse coronary artery calcifications are noted. No pericardial effusion is seen. The thoracic aorta opacifies with no acute abnormality. The pulmonary arteries are well opacified and there is no evidence of acute pulmonary embolism. Mediastinum/Nodes: The previously noted left hilar mass appears to extend into the mediastinum compressing the left pulmonary vein consistent with progressive lung carcinoma. This mass on image 152 measures 68 x 101 mm compared to 72 x 81 mm previously. Pulmonary artery branches do course through this soft tissue mass without evidence of pulmonary embolism. On image 120 series 5 there is a left hilar node indenting the left main pulmonary artery measuring 17 mm in diameter. Anterior mediastinal nodes also are present which appear larger. On image 102 an anterior mediastinal prevascular node measures 14 mm compared to 11 mm previously. These nodes are worrisome for metastatic involvement of mediastinal and hilar lymph nodes. There also is a cluster of nodes precarinal to the right of midline measuring 18 mm, much larger than on the prior CT Lungs/Pleura: The volume of the moderate to large left pleural effusion has increased considerably in  the interval resulting in more volume loss of the left upper lobe and left lower lobe. There is truncation of the bronchus to the left upper lobe with some air reaching the lingula and a portion of the left lower lobe. The right lung appears relatively well aerated. No definite right effusion is seen. Upper Abdomen: The liver is unremarkable with no evidence of a hepatic metastases on this exam. Surgical clips are noted from prior cholecystectomy. The pancreas is unremarkable although somewhat fatty infiltrated. Splenic artery calcifications are present. Musculoskeletal: There are degenerative changes throughout the thoracic spine. Mild compression deformities of several adjacent midthoracic vertebra appear stable. Review of the MIP images confirms the above findings. IMPRESSION: 1. 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 2. Progression of adenopathy involving the left hilum and mediastinum. 3. Increasing size of left pleural effusion. 4. No acute pulmonary embolism is seen. Electronically Signed   By: Ivar Drape M.D.   On: 10/23/2017 16:45   Dg Chest Port 1 View  Result Date: 10/24/2017 CLINICAL DATA:  Shortness of breath.  Difficulty breathing. EXAM: PORTABLE CHEST 1 VIEW COMPARISON:  CT 10/23/2017.  Chest x-ray 10/23/2017. FINDINGS: Stable cardiomegaly. Stable opacification left lung base and prominent left-sided pleural effusion. Reference is made to prior CT of 10/23/2017. No pneumothorax. Left shoulder replacement. IMPRESSION: 1. Stable opacification of the left lung base and stable prominent left-sided pleural effusion. 2.  Stable cardiomegaly. Chest is unchanged from prior exam. Reference is made to prior CT report 10/23/2017. Electronically Signed   By: Marcello Moores  Register   On: 10/24/2017 09:59   Dg Chest Port 1 View  Result Date: 10/17/2017 CLINICAL DATA:  Hemoptysis status post bronchoscopy and lung biopsy. EXAM: PORTABLE CHEST 1 VIEW COMPARISON:  Chest x-ray dated October 09, 2017. FINDINGS: Stable cardiomegaly. Interval decrease in size of now small left pleural effusion. Persistent consolidation in the left lower lobe. No pneumothorax. No acute osseous abnormality. IMPRESSION: 1. Interval decrease in size of now small left pleural effusion. Persistent left lower lobe pneumonia. Electronically Signed   By: Titus Dubin M.D.   On: 10/17/2017 08:38   US Thoracentesis Asp Pleural Space W/img Guide  Result Date: 10/24/2017 INDICATION: Patient with history of recently diagnosed poorly differentiated non-small cell carcinoma of the left lung, dyspnea, recurrent left pleural effusion. Request made for diagnostic and therapeutic left thoracentesis. EXAM: ULTRASOUND GUIDED DIAGNOSTIC AND THERAPEUTIC  LEFT THORACENTESIS MEDICATIONS: None COMPLICATIONS: None immediate. PROCEDURE: An ultrasound guided thoracentesis was thoroughly discussed with the patient and questions answered. The benefits, risks, alternatives and complications were also discussed. The patient understands and wishes to proceed with the procedure. Written consent was obtained. Ultrasound was performed to localize and mark an adequate pocket of fluid in the left chest. The area was then prepped and draped in the normal sterile fashion. 1% Lidocaine was used for local anesthesia. Under ultrasound guidance a 6 Fr Safe-T-Centesis catheter was introduced. Thoracentesis was performed. The catheter was removed and a dressing applied. FINDINGS: A total of approximately 1.3 liters of hazy, yellow fluid was removed. Samples were sent to the laboratory as requested by the clinical team. IMPRESSION: Successful ultrasound guided diagnostic and therapeutic left thoracentesis yielding 1.3 liters of pleural fluid. Read by: Rowe Robert, PA-C Electronically Signed   By: Sandi Mariscal M.D.   On: 10/24/2017 10:54   US Thoracentesis Asp Pleural Space W/img Guide  Result Date: 10/09/2017 INDICATION: Patient with history of sarcoidosis,  coronary artery disease, COPD, CHF, pneumonia, dyspnea, left pleural effusion. Request made for diagnostic and therapeutic left thoracentesis. EXAM: ULTRASOUND GUIDED DIAGNOSTIC AND THERAPEUTIC LEFT THORACENTESIS MEDICATIONS: None COMPLICATIONS: None immediate. PROCEDURE: An ultrasound guided thoracentesis was thoroughly discussed with the patient and questions answered. The benefits, risks, alternatives and complications were also discussed. The patient understands and wishes to proceed with the procedure. Written consent was obtained. Ultrasound was performed to localize and mark an adequate pocket of fluid in the left chest. The area was then prepped and draped in the normal sterile fashion. 1% Lidocaine was used for local anesthesia. Under ultrasound guidance a 6 Fr Safe-T-Centesis catheter was introduced. Thoracentesis was performed. The catheter was removed and a dressing applied. FINDINGS: A total of approximately 1.2 liters of hazy, yellow fluid was removed. Samples were sent to the laboratory as requested by the clinical team. IMPRESSION: Successful ultrasound guided diagnostic and therapeutic left thoracentesis yielding 1.2 liters of pleural fluid. Read by: Rowe Robert, PA-C Electronically Signed   By: Suzy Bouchard M.D.   On: 10/09/2017 17:40   Dg C-arm Bronchoscopy  Result Date: 10/17/2017 C-ARM BRONCHOSCOPY: Fluoroscopy was utilized by the requesting physician.  No radiographic interpretation.    ASSESSMENT: This is a very pleasant 77 years old white male recently diagnosed with stage IIIb/IV (T4, N2, M0/M1 a) non-small cell lung cancer, squamous cell carcinoma presented with large right upper lobe lung mass in addition to mediastinal invasion as well as recurrent left pleural effusion diagnosed in April 2019.  PLAN: I had a lengthy discussion with the patient and his family today about his current disease of stage, prognosis and treatment options.   I personally and independently reviewed  the scan images and discussed the results and showed the images to the patient and his family. I recommended for the patient to complete the staging work-up by ordering a PET scan as well as MRI of the brain to rule out brain metastasis. The patient understands that he has incurable condition and all the treatment will be of palliative nature. I discussed with the patient his treatment options including palliative care and hospice referral versus consideration of palliative systemic chemotherapy with carboplatin for AUC of 5, paclitaxel 175 mg/M2 plus immunotherapy with Keytruda 200 mg IV every 3 weeks. He is interested in proceeding with systemic chemotherapy.  I discussed with him the adverse effect of this treatment including but not limited to alopecia, myelosuppression, nausea and vomiting, peripheral  neuropathy, liver or renal dysfunction. He is expected to start the first cycle of this treatment in 1 week. I will arrange for the patient to have a chemotherapy education class before the first dose of his treatment. For the obstructive left upper lobe lung lesion, I would refer the patient to radiation oncology for consideration of palliative radiotherapy to this area. For the recurrent left pleural effusion, I may consider referring the patient to cardiothoracic surgery for Pleurx catheter placement if he has reaccumulation of the fluid.   I will call his pharmacy with prescription for Compazine 10 mg p.o. every 6 hours as needed for nausea. The patient will come back for follow-up visit with the start of cycle #1 of his treatment. He was advised to call immediately if he has any concerning symptoms in the interval. The patient voices understanding of current disease status and treatment options and is in agreement with the current care plan.  All questions were answered. The patient knows to call the clinic with any problems, questions or concerns. We can certainly see the patient much sooner if  necessary.  Thank you so much for allowing me to participate in the care of [name]. I will continue to follow up the patient with you and assist in his care.  I spent 55 minutes counseling the patient face to face. The total time spent in the appointment was 80 minutes.  Disclaimer: This note was dictated with voice recognition software. Similar sounding words can inadvertently be transcribed and may not be corrected upon review.   Eilleen Kempf Nov 04, 2017, 2:39 PM

## 2017-11-04 NOTE — Progress Notes (Signed)
START ON PATHWAY REGIMEN - Non-Small Cell Lung     A cycle is every 21 days:     Pembrolizumab      Paclitaxel      Carboplatin   **Always confirm dose/schedule in your pharmacy ordering system**    Patient Characteristics: Stage IV Metastatic, Squamous, PS = 0, 1, First Line, PD-L1 Expression Positive 1-49% (TPS) / Negative / Not Tested / Awaiting Test Results AJCC T Category: T4 Current Disease Status: Distant Metastases AJCC N Category: N2 AJCC M Category: M1a AJCC 8 Stage Grouping: IVA Histology: Squamous Cell Line of therapy: First Line PD-L1 Expression Status: Awaiting Test Results Performance Status: PS = 0, 1  Intent of Therapy: Non-Curative / Palliative Intent, Discussed with Patient

## 2017-11-05 ENCOUNTER — Inpatient Hospital Stay: Payer: Medicare Other

## 2017-11-05 ENCOUNTER — Encounter: Payer: Self-pay | Admitting: Radiation Oncology

## 2017-11-07 ENCOUNTER — Telehealth: Payer: Self-pay | Admitting: Internal Medicine

## 2017-11-07 NOTE — Telephone Encounter (Signed)
Appointments scheduled per 5/14 los left message for patient

## 2017-11-09 NOTE — Progress Notes (Addendum)
[place]  [phone] [phone]  Clinic Follow up Note   Patient Care Team: Crist Infante, MD as PCP - General (Internal Medicine) Lorretta Harp, MD as PCP - Cardiology (Cardiology) Chesley Mires, MD as PCP - Pulmonology (Pulmonary Disease) Chesley Mires, MD as Consulting Physician (Pulmonary Disease) Irene Shipper, MD as Consulting Physician (Gastroenterology) 11/10/2017  SUMMARY OF ONCOLOGIC HISTORY:   Stage IV squamous cell carcinoma of left lung ([place])   11/04/2017 Initial Diagnosis    Stage IV squamous cell carcinoma of left lung ([place])      11/10/2017 -  Chemotherapy    The patient had pembrolizumab (KEYTRUDA) 200 mg in sodium chloride 0.9 % 50 mL chemo infusion, 200 mg (original dose 2 mg/kg), Intravenous, Once, 1 of 6 cycles Dose modification: 200 mg (original dose 2 mg/kg, Cycle 1, Reason: Provider Judgment)  for chemotherapy treatment.       11/10/2017 -  Chemotherapy    The patient had palonosetron (ALOXI) injection 0.25 mg, 0.25 mg, Intravenous,  Once, 1 of 4 cycles pegfilgrastim-cbqv (UDENYCA) injection 6 mg, 6 mg, Subcutaneous, Once, 1 of 4 cycles CARBOplatin (PARAPLATIN) 510 mg in sodium chloride 0.9 % 250 mL chemo infusion, 510 mg (100 % of original dose 513.5 mg), Intravenous,  Once, 1 of 4 cycles Dose modification: 513.5 mg (original dose 513.5 mg, Cycle 1) PACLitaxel (TAXOL) 414 mg in sodium chloride 0.9 % 500 mL chemo infusion (> 80mg /m2), 175 mg/m2 = 414 mg, Intravenous,  Once, 1 of 4 cycles fosaprepitant (EMEND) 150 mg, dexamethasone (DECADRON) 12 mg in sodium chloride 0.9 % 145 mL IVPB, , Intravenous,  Once, 1 of 4 cycles  for chemotherapy treatment.      DIAGNOSIS: stage IIIB/IV (T4, N2, M0/M1) non-small cell lung cancer, squamous cell carcinoma   PRIOR THERAPY: none   CURRENT THERAPY: Systemic chemotherapy with carboplatin AUC 5, paclitaxel 175 mg/m2 plus immunotherapy with Keytruda 200 mg IV every 3 weeks, started  11/10/17  INTERVAL HISTORY: Travis Ferguson presents for follow-up and cycle 1 carbo/Taxol/Keytruda.  He was last seen in initial consult by Dr. Julien Nordmann on 11/04/2017.  He reports increasing fatigue and activity intolerance, spending most of his day in the recliner.  He tires easily with little exertion.  Has increased oxygen requirement over the weekend, previously using 2 L O2 only at night, now needing it intermittently throughout the day after activities.  He has a "strong" cough, occasionally lightheaded after coughing episodes. Has intermittent right axilla/lung pain with cough that began 2-3 days ago. On Mucinex and Tessalon. He has conversational dyspnea.  Denies hemoptysis or wheezing.  He has difficulty swallowing carbonated liquids but overall can swallow "99%" of food and liquids without difficulty.  REVIEW OF SYSTEMS:   Constitutional: Denies fevers, chills or abnormal weight loss (+) increased fatigue (+) activity intolerance (+) decreased appetite  Ears, nose, mouth, throat, and face: Denies mucositis or sore throat Respiratory: Denies hemoptysis or wheezes (+) intermittent cough (+) dyspnea (+) intermittent right axillary/lung pain x2-3 days (+) increased supplemental oxygen use  Cardiovascular: Denies palpitation, chest discomfort or lower extremity swelling Gastrointestinal:  Denies vomiting, diarrhea, heartburn or change in bowel habits (+) nausea (+) constipation (+)  dysphagia with carbonated beverages  Skin: Denies abnormal skin rashes Lymphatics: Denies new lymphadenopathy or easy bruising Neurological: Denies numbness, tingling (+) leg weakness (+) occasional lightheadedness with cough (+) arm and leg tremor, seen by neuro who ruled out parkinson's disease  Behavioral/Psych: Mood is stable, no new changes  MSK: (+) chronic left shoulder pain  All other systems were reviewed with the patient and are negative.  MEDICAL HISTORY:  Past Medical History:  Diagnosis Date  . Acute on  chronic combined systolic and diastolic CHF (congestive heart failure) ([place]) 02/24/2015  . Anxiety   . Arthritis    "about q joint I've got" (09/22/2015)  . Atherosclerosis   . CAP (community acquired pneumonia) 02/24/2015  . Cervical disc disease   . Chronic lower back pain    chronic back pain/under pain management  . Colon polyps 08/02/2014   Tubular adenoma x 3, Hyperplastic-1  . COPD (chronic obstructive pulmonary disease) ([place])    a. Former Airline pilot, also smoked a pipe.  . Coronary artery calcification seen on CAT scan   . Depression   . Dyslipidemia   . Dyspnea    chronic  . GERD (gastroesophageal reflux disease)   . Hiatal hernia    sensation problem ("right lateral side hip to knee").  . History of blood transfusion "numerous"  . History of oxygen administration    @ 2 l/m nasally at bedtime.(09/22/2015)  . Hypertension   . Inguinal hernia    right side  . Migraine    "years since I've had one" (09/22/2015)  . OSA (obstructive sleep apnea)    does not use CPAP; "just oxygen" (09/22/2015)  . PTSD (post-traumatic stress disorder)   . Rhinitis   . Sarcoidosis   . Sensation problem    right side-lateral hip to knee" decreased sensation and tingling feeling" -has informed Dr. Joylene Draft, Dr. Henrene Pastor, Dr. Lucia Gaskins of this.  . Sepsis ([place])   . Steroid-induced hyperglycemia 02/25/2015  . Subdural hematoma ([place]) 11/10   a. 2010 s/p surgery - diagnosed several weeks after a fall.    SURGICAL HISTORY: Past Surgical History:  Procedure Laterality Date  . BACK SURGERY    . BRAIN SURGERY  04/2009   right frontal"hemorrhage evacuation from fall injury"  . CARDIAC CATHETERIZATION N/A 01/23/2015   Procedure: Right/Left Heart Cath and Coronary Angiography;  Surgeon: Lorretta Harp, MD;  Location: [facility];  Service: Cardiovascular;  Laterality: N/A;  . CHOLECYSTECTOMY N/A 11/08/2014   Procedure: LAPAROSCOPIC CHOLECYSTECTOMY ;  Surgeon: Alphonsa Overall, MD;  Location: [facility];   Service: General;  Laterality: N/A;  . ERCP N/A 11/07/2014   Procedure: ENDOSCOPIC RETROGRADE CHOLANGIOPANCREATOGRAPHY (ERCP);  Surgeon: Irene Shipper, MD;  Location: [facility];  Service: Endoscopy;  Laterality: N/A;  . GALLBLADDER SURGERY  09/2014   drain tube placed  . INGUINAL HERNIA REPAIR Left   . KNEE ARTHROSCOPY Right   . LUMBAR LAMINECTOMY/DECOMPRESSION MICRODISCECTOMY Right 12/15/2012   Procedure: LUMBAR LAMINECTOMY/DECOMPRESSION MICRODISCECTOMY 1 LEVEL;  Surgeon: Elaina Hoops, MD;  Location: [facility];  Service: Neurosurgery;  Laterality: Right;  Right Lumbar four-five laminectomy/foraminotomy  . REVERSE SHOULDER ARTHROPLASTY Left 09/22/2015   Procedure: LEFT REVERSE TOTAL SHOULDER ARTHROPLASTY;  Surgeon: Netta Cedars, MD;  Location: [facility];  Service: Orthopedics;  Laterality: Left;  . REVERSE TOTAL SHOULDER ARTHROPLASTY Left 09/22/2015  . VIDEO BRONCHOSCOPY  07/28/2012   Procedure: VIDEO BRONCHOSCOPY WITH FLUORO;  Surgeon: Chesley Mires, MD;  Location: [facility];  Service: Cardiopulmonary;  Laterality: Bilateral;  . VIDEO BRONCHOSCOPY Bilateral 10/17/2017   Procedure: VIDEO BRONCHOSCOPY WITH FLUORO;  Surgeon: Chesley Mires, MD;  Location: [facility];  Service: Cardiopulmonary;  Laterality: Bilateral;    I have reviewed the social history and family history with the patient and they are unchanged from previous note.  ALLERGIES:  is allergic to statins.  MEDICATIONS:  Current Outpatient Medications  Medication Sig Dispense Refill  . albuterol (PROVENTIL HFA;VENTOLIN HFA) 108 (90 Base) MCG/ACT inhaler 1-2 PUFFS EVERY 4-6 HOURS AS NEEDED FOR SHORTNESS OF BREATH 18 g 1  . amiodarone (PACERONE) 100 MG tablet Take 1 tablet (100 mg total) by mouth daily. 30 tablet 6  . apixaban (ELIQUIS) 5 MG TABS tablet Take 1 tablet (5 mg total) by mouth 2 (two) times daily. 60 tablet 0  . atorvastatin (LIPITOR) 20 MG tablet Take 1 tablet (20 mg total) by mouth daily at 6 PM. 30 tablet 0  . benzonatate  (TESSALON) 200 MG capsule Take 1 capsule (200 mg total) by mouth 3 (three) times daily as needed for cough. 30 capsule 1  . budesonide-formoterol (SYMBICORT) 160-4.5 MCG/ACT inhaler Inhale 2 puffs into the lungs 2 (two) times daily. 1 Inhaler 0  . chlorhexidine (PERIDEX) 0.12 % solution chlorhexidine gluconate 0.12 % mouthwash    . DULoxetine (CYMBALTA) 30 MG capsule Take 30 mg by mouth daily.    [name] ENTRESTO 24-26 MG TAKE 1 TABLET BY MOUTH 2 (TWO) TIMES DAILY. 180 tablet 1  . finasteride (PROSCAR) 5 MG tablet Take 5 mg by mouth daily.     [name] FLUoxetine (PROZAC) 40 MG capsule Take 40 mg by mouth daily with breakfast.     . furosemide (LASIX) 20 MG tablet Take 1 tablet (20 mg total) by mouth 2 (two) times daily. 60 tablet 0  . guaiFENesin (MUCINEX) 600 MG 12 hr tablet Take 1 tablet (600 mg total) by mouth 2 (two) times daily. 60 tablet 2  . HYDROcodone-acetaminophen (NORCO/VICODIN) 5-325 MG tablet Take 1 tablet by mouth every 8 (eight) hours as needed for moderate pain.     [name] ipratropium (ATROVENT) 0.02 % nebulizer solution Take 2.5 mLs (0.5 mg total) by nebulization 4 (four) times daily. 300 mL 12  . levalbuterol (XOPENEX) 1.25 MG/0.5ML nebulizer solution Take 1.25 mg by nebulization every 8 (eight) hours as needed for wheezing or shortness of breath. 1 each 0  . metoprolol tartrate (LOPRESSOR) 25 MG tablet TAKE 1 TABLET (25 MG TOTAL) BY MOUTH 2 (TWO) TIMES DAILY. 90 tablet 1  . nystatin (MYCOSTATIN/NYSTOP) powder APPLY TO AFFECTED AREA 3 TIMES A DAY  4  . OXYGEN Inhale 2 L/min into the lungs at bedtime.    . prochlorperazine (COMPAZINE) 10 MG tablet Take 1 tablet (10 mg total) by mouth every 6 (six) hours as needed for nausea or vomiting. 30 tablet 0  . sodium chloride (OCEAN) 0.65 % SOLN nasal spray Place 1-2 sprays into both nostrils daily as needed for congestion.    [name] acetaminophen (TYLENOL) 325 MG tablet Take 650 mg by mouth every 6 (six) hours as needed for mild pain.     No current  facility-administered medications for this visit.    Facility-Administered Medications Ordered in Other Visits  Medication Dose Route Frequency Provider Last Rate Last Dose  . 0.9 %  sodium chloride infusion   Intravenous Once Curt Bears, MD      . CARBOplatin (PARAPLATIN) 510 mg in sodium chloride 0.9 % 250 mL chemo infusion  510 mg Intravenous Once Curt Bears, MD        PHYSICAL EXAMINATION: ECOG PERFORMANCE STATUS: 3   Vitals:   11/10/17 0917  BP: (!) 84/58  Pulse: 96  Resp: 20  Temp: 97.8 F (36.6 C)  SpO2: 97%   Filed Weights   11/10/17 0917  Weight: 244  lb 3.2 oz (110.8 kg)    GENERAL:alert, no distress, presents in wheelchair  SKIN: no rashes or significant lesions EYES: normal, Conjunctiva are pink and non-injected, sclera clear OROPHARYNX: no thrush or ulcers  LYMPH:  no palpable cervical or supraclavicular lymphadenopathy LUNGS: clear to auscultation. Decreased left lung sounds. Normal breathing effort on 2 L [place]  HEART: regular rate & rhythm  ABDOMEN:abdomen soft, non-tender and normal bowel sounds Musculoskeletal:no cyanosis of digits and no clubbing  NEURO: alert & oriented x 3 with fluent speech, no focal motor/sensory deficits   LABORATORY DATA:  I have reviewed the data as listed CBC Latest Ref Rng & Units 11/10/2017 11/04/2017 10/24/2017  WBC 4.0 - 10.3 K/uL 14.0(H) 14.8(H) 13.8(H)  Hemoglobin 13.0 - 17.1 g/dL 11.6(L) 11.7(L) 10.9(L)  Hematocrit 38.4 - 49.9 % 35.0(L) 35.3(L) 33.4(L)  Platelets 140 - 400 K/uL 544(H) 516(H) 342     CMP Latest Ref Rng & Units 11/10/2017 11/04/2017 10/24/2017  Glucose 70 - 140 mg/dL 250(H) 135 117(H)  BUN 7 - 26 mg/dL 18 14 13   Creatinine 0.70 - 1.30 mg/dL 1.25 0.97 0.88  Sodium 136 - 145 mmol/L 134(L) 134(L) 135  Potassium 3.5 - 5.1 mmol/L 4.0 4.0 3.6  Chloride 98 - 109 mmol/L 99 97(L) 103  CO2 22 - 29 mmol/L 22 29 23   Calcium 8.4 - 10.4 mg/dL 9.5 9.6 8.8(L)  Total Protein 6.4 - 8.3 g/dL 6.4 6.5 5.9(L)  Total  Bilirubin 0.2 - 1.2 mg/dL 0.7 0.6 0.7  Alkaline Phos 40 - 150 U/L 81 83 58  AST 5 - 34 U/L 12 13 13(L)  ALT 0 - 55 U/L 11 11 13(L)      RADIOGRAPHIC STUDIES: I have personally reviewed the radiological images as listed and agreed with the findings in the report. No results found.   ASSESSMENT & PLAN: Travis Ferguson is a pleasant 77 year old white male with stage IIIb/IV (T4, N2, M0/M1a) non-small cell lung cancer, squamous cell carcinoma presented with large right upper lobe lung mass in addition to mediastinal invasion as well as recurrent left pleural effusion diagnosed in April 2019.  He appears stable. His staging work up is not complete, brain MRI and PET scan to be completed 11/11/17. This was a shared visit with Dr. Julien Nordmann who recommends to proceed with cycle 1 taxol/carboplatin and Keytruda today. Return for lab and f/u in 1 week to monitor side effects from treatment.   He is hypotensive today, BP 84/58. Has been taking metoprolol BID. He will hold BP meds until further notice until pressure is stabilized. Will get IVF support today with chemo. OK to proceed.   For his cough, he will continue current anti-tussive medications. He will meet with Dr. Lisbeth Renshaw for RT consult on 11/12/17 for consideration of palliative radiation for the obstructive left upper lobe lung lesion. PET will be helpful to determine if pleural effusion has increased. Will set him up for thoracentesis if needed. Dr. Julien Nordmann previously discussed possible pleurex catheter placement if he requires multiple frequent procedures. He has persistent fatigue with activity intolerance, I encouraged him to increase activity as tolerated. He has a cane and walker I home, I recommend he use those for safety.   PLAN: -Labs reviewed, proceed with cycle 1 taxol/carboplatin and Keytruda -500 cc NS for hypotension -Hold BP meds until further notice  -Cane and walker at home with ambulation  -PET, brain MRI 11/11/17 -Consult with Dr.  Lisbeth Renshaw in rad onc 11/12/17  -lab, f/u in 1  week for symptom management check   All questions were answered. The patient knows to call the clinic with any problems, questions or concerns. No barriers to learning was detected. I spent 25 minutes counseling the patient face to face. The total time spent in the appointment was 40 minutes and more than 50% was on counseling and review of test results     Alla Feeling, NP 11/10/17   ADDENDUM: Hematology/Oncology Attending: I had a face-to-face encounter with the patient today.  I recommended his care plan.  This is a very pleasant 77 years old white male with stage IV non-small cell lung cancer, squamous cell carcinoma.  The patient is here today for evaluation before starting the first cycle of his systemic chemotherapy with carboplatin, paclitaxel and Keytruda.  He continues to have shortness of breath with minimal exertion and he is scheduled to see Dr. Lisbeth Renshaw tomorrow for consideration of palliative radiotherapy to the obstructive left upper lobe lung lesion.  He is a scheduled to have PET scan as well as MRI of the brain tomorrow.  After the PET scan showed reaccumulation of the left pleural fluid, will consider the patient for repeat thoracentesis or Pleurx catheter placement. I recommended for the patient to proceed with his treatment today as a scheduled. We will see him back for follow-up visit in 1 week for reevaluation and management of any adverse effect of his treatment. The patient was advised to call immediately if he has any concerning symptoms in the interval.  Disclaimer: This note was dictated with voice recognition software. Similar sounding words can inadvertently be transcribed and may be missed upon review. Eilleen Kempf, MD 11/10/17

## 2017-11-10 ENCOUNTER — Telehealth: Payer: Self-pay | Admitting: Nurse Practitioner

## 2017-11-10 ENCOUNTER — Encounter: Payer: Self-pay | Admitting: Internal Medicine

## 2017-11-10 ENCOUNTER — Encounter: Payer: Self-pay | Admitting: Nurse Practitioner

## 2017-11-10 ENCOUNTER — Inpatient Hospital Stay (HOSPITAL_BASED_OUTPATIENT_CLINIC_OR_DEPARTMENT_OTHER): Payer: Medicare Other | Admitting: Nurse Practitioner

## 2017-11-10 ENCOUNTER — Inpatient Hospital Stay: Payer: Medicare Other

## 2017-11-10 ENCOUNTER — Other Ambulatory Visit: Payer: Medicare Other

## 2017-11-10 VITALS — BP 126/82 | HR 88 | Temp 98.3°F | Resp 18

## 2017-11-10 VITALS — BP 84/58 | HR 96 | Temp 97.8°F | Resp 20 | Ht 71.0 in | Wt 244.2 lb

## 2017-11-10 DIAGNOSIS — K219 Gastro-esophageal reflux disease without esophagitis: Secondary | ICD-10-CM | POA: Diagnosis not present

## 2017-11-10 DIAGNOSIS — C3490 Malignant neoplasm of unspecified part of unspecified bronchus or lung: Secondary | ICD-10-CM

## 2017-11-10 DIAGNOSIS — J449 Chronic obstructive pulmonary disease, unspecified: Secondary | ICD-10-CM

## 2017-11-10 DIAGNOSIS — J90 Pleural effusion, not elsewhere classified: Secondary | ICD-10-CM

## 2017-11-10 DIAGNOSIS — E785 Hyperlipidemia, unspecified: Secondary | ICD-10-CM

## 2017-11-10 DIAGNOSIS — I2510 Atherosclerotic heart disease of native coronary artery without angina pectoris: Secondary | ICD-10-CM | POA: Diagnosis not present

## 2017-11-10 DIAGNOSIS — C3411 Malignant neoplasm of upper lobe, right bronchus or lung: Secondary | ICD-10-CM

## 2017-11-10 DIAGNOSIS — F419 Anxiety disorder, unspecified: Secondary | ICD-10-CM | POA: Diagnosis not present

## 2017-11-10 DIAGNOSIS — I509 Heart failure, unspecified: Secondary | ICD-10-CM | POA: Diagnosis not present

## 2017-11-10 DIAGNOSIS — Z87891 Personal history of nicotine dependence: Secondary | ICD-10-CM

## 2017-11-10 DIAGNOSIS — R5382 Chronic fatigue, unspecified: Secondary | ICD-10-CM

## 2017-11-10 DIAGNOSIS — Z510 Encounter for antineoplastic radiation therapy: Secondary | ICD-10-CM

## 2017-11-10 DIAGNOSIS — D869 Sarcoidosis, unspecified: Secondary | ICD-10-CM | POA: Diagnosis not present

## 2017-11-10 DIAGNOSIS — C3492 Malignant neoplasm of unspecified part of left bronchus or lung: Secondary | ICD-10-CM

## 2017-11-10 DIAGNOSIS — G4730 Sleep apnea, unspecified: Secondary | ICD-10-CM

## 2017-11-10 DIAGNOSIS — Z79899 Other long term (current) drug therapy: Secondary | ICD-10-CM

## 2017-11-10 DIAGNOSIS — F4310 Post-traumatic stress disorder, unspecified: Secondary | ICD-10-CM

## 2017-11-10 DIAGNOSIS — I4891 Unspecified atrial fibrillation: Secondary | ICD-10-CM

## 2017-11-10 LAB — CMP (CANCER CENTER ONLY)
ALK PHOS: 81 U/L (ref 40–150)
ALT: 11 U/L (ref 0–55)
ANION GAP: 13 — AB (ref 3–11)
AST: 12 U/L (ref 5–34)
Albumin: 2.5 g/dL — ABNORMAL LOW (ref 3.5–5.0)
BUN: 18 mg/dL (ref 7–26)
CALCIUM: 9.5 mg/dL (ref 8.4–10.4)
CO2: 22 mmol/L (ref 22–29)
Chloride: 99 mmol/L (ref 98–109)
Creatinine: 1.25 mg/dL (ref 0.70–1.30)
GFR, Estimated: 54 mL/min — ABNORMAL LOW (ref >60)
GLUCOSE: 250 mg/dL — AB (ref 70–140)
Potassium: 4 mmol/L (ref 3.5–5.1)
SODIUM: 134 mmol/L — AB (ref 136–145)
TOTAL PROTEIN: 6.4 g/dL (ref 6.4–8.3)
Total Bilirubin: 0.7 mg/dL (ref 0.2–1.2)

## 2017-11-10 LAB — CBC WITH DIFFERENTIAL (CANCER CENTER ONLY)
Basophils Absolute: 0.2 10*3/uL — ABNORMAL HIGH (ref 0.0–0.1)
Basophils Relative: 2 %
EOS ABS: 0.3 10*3/uL (ref 0.0–0.5)
Eosinophils Relative: 2 %
HCT: 35 % — ABNORMAL LOW (ref 38.4–49.9)
Hemoglobin: 11.6 g/dL — ABNORMAL LOW (ref 13.0–17.1)
LYMPHS ABS: 0.8 10*3/uL — AB (ref 0.9–3.3)
Lymphocytes Relative: 6 %
MCH: 26.6 pg — AB (ref 27.2–33.4)
MCHC: 33.1 g/dL (ref 32.0–36.0)
MCV: 80.3 fL (ref 79.3–98.0)
MONOS PCT: 4 %
Monocytes Absolute: 0.5 10*3/uL (ref 0.1–0.9)
NEUTROS PCT: 86 %
Neutro Abs: 12.2 10*3/uL — ABNORMAL HIGH (ref 1.5–6.5)
PLATELETS: 544 10*3/uL — AB (ref 140–400)
RBC: 4.36 MIL/uL (ref 4.20–5.82)
RDW: 15.5 % — ABNORMAL HIGH (ref 11.0–14.6)
WBC Count: 14 10*3/uL — ABNORMAL HIGH (ref 4.0–10.3)

## 2017-11-10 LAB — TSH: TSH: 3.833 u[IU]/mL (ref 0.320–4.118)

## 2017-11-10 MED ORDER — DIPHENHYDRAMINE HCL 50 MG/ML IJ SOLN
INTRAMUSCULAR | Status: AC
  Filled 2017-11-10: qty 1

## 2017-11-10 MED ORDER — SODIUM CHLORIDE 0.9 % IV SOLN
Freq: Once | INTRAVENOUS | Status: AC
  Administered 2017-11-10: 12:00:00 via INTRAVENOUS
  Filled 2017-11-10: qty 5

## 2017-11-10 MED ORDER — PROCHLORPERAZINE MALEATE 10 MG PO TABS: 10.0000 mg | ORAL_TABLET | Freq: Once | ORAL | Status: DC

## 2017-11-10 MED ORDER — SODIUM CHLORIDE 0.9 % IV SOLN
Freq: Once | INTRAVENOUS | Status: AC
  Administered 2017-11-10: 11:00:00 via INTRAVENOUS

## 2017-11-10 MED ORDER — SODIUM CHLORIDE 0.9 % IV SOLN
200.0000 mg | Freq: Once | INTRAVENOUS | Status: AC
  Administered 2017-11-10: 200 mg via INTRAVENOUS
  Filled 2017-11-10: qty 8

## 2017-11-10 MED ORDER — FAMOTIDINE IN NACL 20-0.9 MG/50ML-% IV SOLN
20.0000 mg | Freq: Once | INTRAVENOUS | Status: AC
  Administered 2017-11-10: 20 mg via INTRAVENOUS

## 2017-11-10 MED ORDER — FAMOTIDINE IN NACL 20-0.9 MG/50ML-% IV SOLN
INTRAVENOUS | Status: AC
  Filled 2017-11-10: qty 50

## 2017-11-10 MED ORDER — SODIUM CHLORIDE 0.9 % IV SOLN
175.0000 mg/m2 | Freq: Once | INTRAVENOUS | Status: AC
  Administered 2017-11-10: 414 mg via INTRAVENOUS
  Filled 2017-11-10: qty 69

## 2017-11-10 MED ORDER — PALONOSETRON HCL INJECTION 0.25 MG/5ML
0.2500 mg | Freq: Once | INTRAVENOUS | Status: AC
  Administered 2017-11-10: 0.25 mg via INTRAVENOUS

## 2017-11-10 MED ORDER — SODIUM CHLORIDE 0.9 % IV SOLN: Freq: Once | INTRAVENOUS | Status: DC

## 2017-11-10 MED ORDER — SODIUM CHLORIDE 0.9 % IV SOLN
513.5000 mg | Freq: Once | INTRAVENOUS | Status: AC
  Administered 2017-11-10: 510 mg via INTRAVENOUS
  Filled 2017-11-10: qty 51

## 2017-11-10 MED ORDER — DIPHENHYDRAMINE HCL 50 MG/ML IJ SOLN
50.0000 mg | Freq: Once | INTRAMUSCULAR | Status: AC
  Administered 2017-11-10: 50 mg via INTRAVENOUS

## 2017-11-10 MED ORDER — SODIUM CHLORIDE 0.9 % IV SOLN: 20.0000 mg | Freq: Once | INTRAVENOUS | Status: DC

## 2017-11-10 MED ORDER — PALONOSETRON HCL INJECTION 0.25 MG/5ML
INTRAVENOUS | Status: AC
  Filled 2017-11-10: qty 5

## 2017-11-10 NOTE — Progress Notes (Signed)
Met w/ pt to introduce myself as his Arboriculturist.  Unfortunately there aren't any foundations offering copay assistance at this time.  I offered the [employer], went over what it covers and gave him the income requirement.  He stated he exceeds the income requirement so he doesn't qualify for the grant.

## 2017-11-10 NOTE — Progress Notes (Signed)
BP 86/72 after 1/2L IVF over 2 hours, prior to initiation of taxol.  OK to proceed per Cira Rue, NP.    BP's steadily increased during first hour of taxol  (see flowsheet)  BP up to around normal at this time from 86/72 originally.  Pt states he feels fine, no other symptoms of reaction. Cira Rue, NP informed.  Ok to continue infusion.

## 2017-11-10 NOTE — Patient Instructions (Addendum)
[facility] Discharge Instructions for Patients Receiving Chemotherapy  Today you received the following chemotherapy agents taxol/carboplatin/keytruda  To help prevent nausea and vomiting after your treatment, we encourage you to take your nausea medication as directed  If you develop nausea and vomiting that is not controlled by your nausea medication, call the clinic.   BELOW ARE SYMPTOMS THAT SHOULD BE REPORTED IMMEDIATELY:  *FEVER GREATER THAN 100.5 F  *CHILLS WITH OR WITHOUT FEVER  NAUSEA AND VOMITING THAT IS NOT CONTROLLED WITH YOUR NAUSEA MEDICATION  *UNUSUAL SHORTNESS OF BREATH  *UNUSUAL BRUISING OR BLEEDING  TENDERNESS IN MOUTH AND THROAT WITH OR WITHOUT PRESENCE OF ULCERS  *URINARY PROBLEMS  *BOWEL PROBLEMS  UNUSUAL RASH Items with * indicate a potential emergency and should be followed up as soon as possible.  Feel free to call the clinic you have any questions or concerns. The clinic phone number is [phone].  Paclitaxel injection What is this medicine? PACLITAXEL (PAK li TAX el) is a chemotherapy drug. It targets fast dividing cells, like cancer cells, and causes these cells to die. This medicine is used to treat ovarian cancer, breast cancer, and other cancers. This medicine may be used for other purposes; ask your health care provider or pharmacist if you have questions. COMMON BRAND NAME(S): Onxol, Taxol What should I tell my health care provider before I take this medicine? They need to know if you have any of these conditions: -blood disorders -irregular heartbeat -infection (especially a virus infection such as chickenpox, cold sores, or herpes) -liver disease -previous or ongoing radiation therapy -an unusual or allergic reaction to paclitaxel, alcohol, polyoxyethylated castor oil, other chemotherapy agents, other medicines, foods, dyes, or preservatives -pregnant or trying to get pregnant -breast-feeding How should I use this  medicine? This drug is given as an infusion into a vein. It is administered in a hospital or clinic by a specially trained health care professional. Talk to your pediatrician regarding the use of this medicine in children. Special care may be needed. Overdosage: If you think you have taken too much of this medicine contact a poison control center or emergency room at once. NOTE: This medicine is only for you. Do not share this medicine with others. What if I miss a dose? It is important not to miss your dose. Call your doctor or health care professional if you are unable to keep an appointment. What may interact with this medicine? Do not take this medicine with any of the following medications: -disulfiram -metronidazole This medicine may also interact with the following medications: -cyclosporine -diazepam -ketoconazole -medicines to increase blood counts like filgrastim, pegfilgrastim, sargramostim -other chemotherapy drugs like cisplatin, doxorubicin, epirubicin, etoposide, teniposide, vincristine -quinidine -testosterone -vaccines -verapamil Talk to your doctor or health care professional before taking any of these medicines: -acetaminophen -aspirin -ibuprofen -ketoprofen -naproxen This list may not describe all possible interactions. Give your health care provider a list of all the medicines, herbs, non-prescription drugs, or dietary supplements you use. Also tell them if you smoke, drink alcohol, or use illegal drugs. Some items may interact with your medicine. What should I watch for while using this medicine? Your condition will be monitored carefully while you are receiving this medicine. You will need important blood work done while you are taking this medicine. This medicine can cause serious allergic reactions. To reduce your risk you will need to take other medicine(s) before treatment with this medicine. If you experience allergic reactions like skin rash, itching or  hives, swelling of the face, lips, or tongue, tell your doctor or health care professional right away. In some cases, you may be given additional medicines to help with side effects. Follow all directions for their use. This drug may make you feel generally unwell. This is not uncommon, as chemotherapy can affect healthy cells as well as cancer cells. Report any side effects. Continue your course of treatment even though you feel ill unless your doctor tells you to stop. Call your doctor or health care professional for advice if you get a fever, chills or sore throat, or other symptoms of a cold or flu. Do not treat yourself. This drug decreases your body's ability to fight infections. Try to avoid being around people who are sick. This medicine may increase your risk to bruise or bleed. Call your doctor or health care professional if you notice any unusual bleeding. Be careful brushing and flossing your teeth or using a toothpick because you may get an infection or bleed more easily. If you have any dental work done, tell your dentist you are receiving this medicine. Avoid taking products that contain aspirin, acetaminophen, ibuprofen, naproxen, or ketoprofen unless instructed by your doctor. These medicines may hide a fever. Do not become pregnant while taking this medicine. Women should inform their doctor if they wish to become pregnant or think they might be pregnant. There is a potential for serious side effects to an unborn child. Talk to your health care professional or pharmacist for more information. Do not breast-feed an infant while taking this medicine. Men are advised not to father a child while receiving this medicine. This product may contain alcohol. Ask your pharmacist or healthcare provider if this medicine contains alcohol. Be sure to tell all healthcare providers you are taking this medicine. Certain medicines, like metronidazole and disulfiram, can cause an unpleasant reaction when  taken with alcohol. The reaction includes flushing, headache, nausea, vomiting, sweating, and increased thirst. The reaction can last from 30 minutes to several hours. What side effects may I notice from receiving this medicine? Side effects that you should report to your doctor or health care professional as soon as possible: -allergic reactions like skin rash, itching or hives, swelling of the face, lips, or tongue -low blood counts - This drug may decrease the number of white blood cells, red blood cells and platelets. You may be at increased risk for infections and bleeding. -signs of infection - fever or chills, cough, sore throat, pain or difficulty passing urine -signs of decreased platelets or bleeding - bruising, pinpoint red spots on the skin, black, tarry stools, nosebleeds -signs of decreased red blood cells - unusually weak or tired, fainting spells, lightheadedness -breathing problems -chest pain -high or low blood pressure -mouth sores -nausea and vomiting -pain, swelling, redness or irritation at the injection site -pain, tingling, numbness in the hands or feet -slow or irregular heartbeat -swelling of the ankle, feet, hands Side effects that usually do not require medical attention (report to your doctor or health care professional if they continue or are bothersome): -bone pain -complete hair loss including hair on your head, underarms, pubic hair, eyebrows, and eyelashes -changes in the color of fingernails -diarrhea -loosening of the fingernails -loss of appetite -muscle or joint pain -red flush to skin -sweating This list may not describe all possible side effects. Call your doctor for medical advice about side effects. You may report side effects to FDA at 1-800-FDA-1088. Where should I keep my medicine? This drug  is given in a hospital or clinic and will not be stored at home. NOTE: This sheet is a summary. It may not cover all possible information. If you have  questions about this medicine, talk to your doctor, pharmacist, or health care provider.  2018 Elsevier/Gold Standard (2015-04-11 19:58:00)   Carboplatin injection What is this medicine? CARBOPLATIN (KAR boe pla tin) is a chemotherapy drug. It targets fast dividing cells, like cancer cells, and causes these cells to die. This medicine is used to treat ovarian cancer and many other cancers. This medicine may be used for other purposes; ask your health care provider or pharmacist if you have questions. COMMON BRAND NAME(S): Paraplatin What should I tell my health care provider before I take this medicine? They need to know if you have any of these conditions: -blood disorders -hearing problems -kidney disease -recent or ongoing radiation therapy -an unusual or allergic reaction to carboplatin, cisplatin, other chemotherapy, other medicines, foods, dyes, or preservatives -pregnant or trying to get pregnant -breast-feeding How should I use this medicine? This drug is usually given as an infusion into a vein. It is administered in a hospital or clinic by a specially trained health care professional. Talk to your pediatrician regarding the use of this medicine in children. Special care may be needed. Overdosage: If you think you have taken too much of this medicine contact a poison control center or emergency room at once. NOTE: This medicine is only for you. Do not share this medicine with others. What if I miss a dose? It is important not to miss a dose. Call your doctor or health care professional if you are unable to keep an appointment. What may interact with this medicine? -medicines for seizures -medicines to increase blood counts like filgrastim, pegfilgrastim, sargramostim -some antibiotics like amikacin, gentamicin, neomycin, streptomycin, tobramycin -vaccines Talk to your doctor or health care professional before taking any of these  medicines: -acetaminophen -aspirin -ibuprofen -ketoprofen -naproxen This list may not describe all possible interactions. Give your health care provider a list of all the medicines, herbs, non-prescription drugs, or dietary supplements you use. Also tell them if you smoke, drink alcohol, or use illegal drugs. Some items may interact with your medicine. What should I watch for while using this medicine? Your condition will be monitored carefully while you are receiving this medicine. You will need important blood work done while you are taking this medicine. This drug may make you feel generally unwell. This is not uncommon, as chemotherapy can affect healthy cells as well as cancer cells. Report any side effects. Continue your course of treatment even though you feel ill unless your doctor tells you to stop. In some cases, you may be given additional medicines to help with side effects. Follow all directions for their use. Call your doctor or health care professional for advice if you get a fever, chills or sore throat, or other symptoms of a cold or flu. Do not treat yourself. This drug decreases your body's ability to fight infections. Try to avoid being around people who are sick. This medicine may increase your risk to bruise or bleed. Call your doctor or health care professional if you notice any unusual bleeding. Be careful brushing and flossing your teeth or using a toothpick because you may get an infection or bleed more easily. If you have any dental work done, tell your dentist you are receiving this medicine. Avoid taking products that contain aspirin, acetaminophen, ibuprofen, naproxen, or ketoprofen unless instructed by  your doctor. These medicines may hide a fever. Do not become pregnant while taking this medicine. Women should inform their doctor if they wish to become pregnant or think they might be pregnant. There is a potential for serious side effects to an unborn child. Talk to  your health care professional or pharmacist for more information. Do not breast-feed an infant while taking this medicine. What side effects may I notice from receiving this medicine? Side effects that you should report to your doctor or health care professional as soon as possible: -allergic reactions like skin rash, itching or hives, swelling of the face, lips, or tongue -signs of infection - fever or chills, cough, sore throat, pain or difficulty passing urine -signs of decreased platelets or bleeding - bruising, pinpoint red spots on the skin, black, tarry stools, nosebleeds -signs of decreased red blood cells - unusually weak or tired, fainting spells, lightheadedness -breathing problems -changes in hearing -changes in vision -chest pain -high blood pressure -low blood counts - This drug may decrease the number of white blood cells, red blood cells and platelets. You may be at increased risk for infections and bleeding. -nausea and vomiting -pain, swelling, redness or irritation at the injection site -pain, tingling, numbness in the hands or feet -problems with balance, talking, walking -trouble passing urine or change in the amount of urine Side effects that usually do not require medical attention (report to your doctor or health care professional if they continue or are bothersome): -hair loss -loss of appetite -metallic taste in the mouth or changes in taste This list may not describe all possible side effects. Call your doctor for medical advice about side effects. You may report side effects to FDA at 1-800-FDA-1088. Where should I keep my medicine? This drug is given in a hospital or clinic and will not be stored at home. NOTE: This sheet is a summary. It may not cover all possible information. If you have questions about this medicine, talk to your doctor, pharmacist, or health care provider.  2018 Elsevier/Gold Standard (2007-09-15 14:38:05)  Pembrolizumab injection What  is this medicine? PEMBROLIZUMAB (pem broe liz ue mab) is a monoclonal antibody. It is used to treat melanoma, head and neck cancer, Hodgkin lymphoma, non-small cell lung cancer, urothelial cancer, stomach cancer, and cancers that have a certain genetic condition. This medicine may be used for other purposes; ask your health care provider or pharmacist if you have questions. COMMON BRAND NAME(S): Keytruda What should I tell my health care provider before I take this medicine? They need to know if you have any of these conditions: -diabetes -immune system problems -inflammatory bowel disease -liver disease -lung or breathing disease -lupus -organ transplant -an unusual or allergic reaction to pembrolizumab, other medicines, foods, dyes, or preservatives -pregnant or trying to get pregnant -breast-feeding How should I use this medicine? This medicine is for infusion into a vein. It is given by a health care professional in a hospital or clinic setting. A special MedGuide will be given to you before each treatment. Be sure to read this information carefully each time. Talk to your pediatrician regarding the use of this medicine in children. While this drug may be prescribed for selected conditions, precautions do apply. Overdosage: If you think you have taken too much of this medicine contact a poison control center or emergency room at once. NOTE: This medicine is only for you. Do not share this medicine with others. What if I miss a dose? It is important not  to miss your dose. Call your doctor or health care professional if you are unable to keep an appointment. What may interact with this medicine? Interactions have not been studied. Give your health care provider a list of all the medicines, herbs, non-prescription drugs, or dietary supplements you use. Also tell them if you smoke, drink alcohol, or use illegal drugs. Some items may interact with your medicine. This list may not describe  all possible interactions. Give your health care provider a list of all the medicines, herbs, non-prescription drugs, or dietary supplements you use. Also tell them if you smoke, drink alcohol, or use illegal drugs. Some items may interact with your medicine. What should I watch for while using this medicine? Your condition will be monitored carefully while you are receiving this medicine. You may need blood work done while you are taking this medicine. Do not become pregnant while taking this medicine or for 4 months after stopping it. Women should inform their doctor if they wish to become pregnant or think they might be pregnant. There is a potential for serious side effects to an unborn child. Talk to your health care professional or pharmacist for more information. Do not breast-feed an infant while taking this medicine or for 4 months after the last dose. What side effects may I notice from receiving this medicine? Side effects that you should report to your doctor or health care professional as soon as possible: -allergic reactions like skin rash, itching or hives, swelling of the face, lips, or tongue -bloody or black, tarry -breathing problems -changes in vision -chest pain -chills -constipation -cough -dizziness or feeling faint or lightheaded -fast or irregular heartbeat -fever -flushing -hair loss -low blood counts - this medicine may decrease the number of white blood cells, red blood cells and platelets. You may be at increased risk for infections and bleeding. -muscle pain -muscle weakness -persistent headache -signs and symptoms of high blood sugar such as dizziness; dry mouth; dry skin; fruity breath; nausea; stomach pain; increased hunger or thirst; increased urination -signs and symptoms of kidney injury like trouble passing urine or change in the amount of urine -signs and symptoms of liver injury like dark urine, light-colored stools, loss of appetite, nausea, right  upper belly pain, yellowing of the eyes or skin -stomach pain -sweating -weight loss Side effects that usually do not require medical attention (report to your doctor or health care professional if they continue or are bothersome): -decreased appetite -diarrhea -tiredness This list may not describe all possible side effects. Call your doctor for medical advice about side effects. You may report side effects to FDA at 1-800-FDA-1088. Where should I keep my medicine? This drug is given in a hospital or clinic and will not be stored at home. NOTE: This sheet is a summary. It may not cover all possible information. If you have questions about this medicine, talk to your doctor, pharmacist, or health care provider.  2018 Elsevier/Gold Standard (2016-03-19 12:29:36)

## 2017-11-10 NOTE — Telephone Encounter (Signed)
Scheduled appt per 5/20 los - Gave patient aVS and calender per los.

## 2017-11-11 ENCOUNTER — Ambulatory Visit (HOSPITAL_COMMUNITY)
Admission: RE | Admit: 2017-11-11 | Discharge: 2017-11-11 | Disposition: A | Discharge status: Home / Self Care | Payer: Medicare Other | Source: Ambulatory Visit | Attending: Internal Medicine | Admitting: Internal Medicine

## 2017-11-11 ENCOUNTER — Encounter (HOSPITAL_COMMUNITY): Payer: Self-pay

## 2017-11-11 ENCOUNTER — Encounter (HOSPITAL_COMMUNITY)
Admission: RE | Admit: 2017-11-11 | Discharge: 2017-11-11 | Disposition: A | Discharge status: Home / Self Care | Payer: Medicare Other | Source: Ambulatory Visit | Attending: Internal Medicine | Admitting: Internal Medicine

## 2017-11-11 DIAGNOSIS — R918 Other nonspecific abnormal finding of lung field: Secondary | ICD-10-CM | POA: Diagnosis not present

## 2017-11-11 DIAGNOSIS — G319 Degenerative disease of nervous system, unspecified: Secondary | ICD-10-CM | POA: Insufficient documentation

## 2017-11-11 DIAGNOSIS — C3490 Malignant neoplasm of unspecified part of unspecified bronchus or lung: Secondary | ICD-10-CM

## 2017-11-11 DIAGNOSIS — D869 Sarcoidosis, unspecified: Secondary | ICD-10-CM | POA: Diagnosis not present

## 2017-11-11 DIAGNOSIS — Z79899 Other long term (current) drug therapy: Secondary | ICD-10-CM | POA: Insufficient documentation

## 2017-11-11 DIAGNOSIS — I898 Other specified noninfective disorders of lymphatic vessels and lymph nodes: Secondary | ICD-10-CM | POA: Insufficient documentation

## 2017-11-11 DIAGNOSIS — I739 Peripheral vascular disease, unspecified: Secondary | ICD-10-CM | POA: Diagnosis not present

## 2017-11-11 DIAGNOSIS — J90 Pleural effusion, not elsewhere classified: Secondary | ICD-10-CM | POA: Diagnosis not present

## 2017-11-11 DIAGNOSIS — Z7951 Long term (current) use of inhaled steroids: Secondary | ICD-10-CM | POA: Insufficient documentation

## 2017-11-11 LAB — GLUCOSE, CAPILLARY: Glucose-Capillary: 132 mg/dL — ABNORMAL HIGH (ref 65–99)

## 2017-11-11 MED ORDER — FLUDEOXYGLUCOSE F - 18 (FDG) INJECTION
12.1000 | Freq: Once | INTRAVENOUS | Status: AC | PRN
  Administered 2017-11-11: 12.1 via INTRAVENOUS

## 2017-11-11 MED ORDER — GADOBENATE DIMEGLUMINE 529 MG/ML IV SOLN
20.0000 mL | Freq: Once | INTRAVENOUS | Status: AC | PRN
  Administered 2017-11-11: 20 mL via INTRAVENOUS

## 2017-11-11 NOTE — Progress Notes (Signed)
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  He has increased his oxygen requirement from 2L at night to now needing it intermittently throughout the day after activities.  Large right upper lobe lung mass in addition to mediastinal invasion as well as recurrent  Left pleural effusion diagnosed in April 2019.  PET 11/11/2017 MRI Brain 11/11/2017  Biopsies of Left Upper lobe 10/17/2017   Tobacco/Marijuana/Snuff/ETOH use: No  Past/Anticipated interventions by cardiothoracic surgery, if any:   Past/Anticipated interventions by medical oncology, if any:  Dr. Cruz Condon Cira Rue 11/10/2017 -Current Therapy: Systemic chemotherapy with carboplatin AUC 5, paclitaxel 175 mg/m2 plus immunotherapy with Keytruda 200 mg IV every 3 weeks, started 11/10/17 -This was a shared visit with Dr. Julien Nordmann who recommends to proceed with cycle 1 taxol/carboplatin and Keytruda today. Return for lab and f/u in 1 week to monitor side effects from treatment.  -He will meet with Dr. Lisbeth Renshaw for RT consult on 11/12/17 for consideration of palliative radiation for the obstructive left upper lobe lung lesion -PET will be helpful to determine if pleural effusion has increased.  -Will set him up for thoracentesis if needed. Dr. Julien Nordmann previously discussed possible pleurex catheter placement if he requires multiple frequent procedures.  -He has persistent fatigue with activity intolerance, I encouraged him to increase activity as tolerated. He has a cane and walker I home, I recommend he use those for safety.    Signs/Symptoms  Weight changes, if any: Lost about 20 pounds due to fluid build-up  Respiratory complaints, if any: SOB, 2L oxygen at night, obtaining a CPAP soon.  He has a history of COPD, nothing out of the ordinary.  Hemoptysis, if any:  productive cough clear/milky white muscu, no blood noted  Pain issues, if any: Shoulder, lower back.  BP (!) 86/55 (BP Location: Left Arm, Patient Position: Sitting, Cuff Size: Normal)   Pulse 99   Temp 98.2 F (36.8 C) (Oral)   Resp 18   Ht 5\' 11"  (1.803 m)   Wt 246 lb 3.2 oz (111.7 kg)   SpO2 96%   BMI 34.34 kg/m    Wt Readings from Last 3 Encounters:  11/12/17 246 lb 3.2 oz (111.7 kg)  11/10/17 244 lb 3.2 oz (110.8 kg)  11/04/17 244 lb 9.6 oz (110.9 kg)   SAFETY ISSUES:  Prior radiation? No  Pacemaker/ICD? No  Possible current pregnancy? No  Is the patient on methotrexate? No  Current Complaints / other details:    He has a "strong" cough, occasionally lightheaded after coughing episodes. Has intermittent right axilla/lung pain with cough that began 2-3 days ago. On Mucinex and Tessalon. He has conversational dyspnea.  Denies hemoptysis or wheezing.  He has difficulty swallowing carbonated liquids but overall can swallow "99%" of food and liquids without difficulty

## 2017-11-12 ENCOUNTER — Encounter: Payer: Self-pay | Admitting: Radiation Oncology

## 2017-11-12 ENCOUNTER — Inpatient Hospital Stay: Payer: Medicare Other | Admitting: Adult Health

## 2017-11-12 ENCOUNTER — Other Ambulatory Visit: Payer: Self-pay

## 2017-11-12 ENCOUNTER — Ambulatory Visit
Admission: RE | Admit: 2017-11-12 | Discharge: 2017-11-12 | Disposition: A | Discharge status: Home / Self Care | Payer: Medicare Other | Source: Ambulatory Visit | Attending: Radiation Oncology | Admitting: Radiation Oncology

## 2017-11-12 ENCOUNTER — Ambulatory Visit: Payer: Medicare Other

## 2017-11-12 ENCOUNTER — Inpatient Hospital Stay: Payer: Medicare Other

## 2017-11-12 VITALS — BP 110/70 | HR 110 | Temp 97.9°F | Resp 20

## 2017-11-12 VITALS — BP 86/55 | HR 99 | Temp 98.2°F | Resp 18 | Ht 71.0 in | Wt 246.2 lb

## 2017-11-12 DIAGNOSIS — Z800 Family history of malignant neoplasm of digestive organs: Secondary | ICD-10-CM | POA: Diagnosis not present

## 2017-11-12 DIAGNOSIS — I5043 Acute on chronic combined systolic (congestive) and diastolic (congestive) heart failure: Secondary | ICD-10-CM | POA: Diagnosis not present

## 2017-11-12 DIAGNOSIS — F4310 Post-traumatic stress disorder, unspecified: Secondary | ICD-10-CM | POA: Insufficient documentation

## 2017-11-12 DIAGNOSIS — J90 Pleural effusion, not elsewhere classified: Secondary | ICD-10-CM | POA: Diagnosis not present

## 2017-11-12 DIAGNOSIS — C3492 Malignant neoplasm of unspecified part of left bronchus or lung: Secondary | ICD-10-CM

## 2017-11-12 DIAGNOSIS — I2510 Atherosclerotic heart disease of native coronary artery without angina pectoris: Secondary | ICD-10-CM | POA: Diagnosis not present

## 2017-11-12 DIAGNOSIS — K219 Gastro-esophageal reflux disease without esophagitis: Secondary | ICD-10-CM | POA: Diagnosis not present

## 2017-11-12 DIAGNOSIS — M5030 Other cervical disc degeneration, unspecified cervical region: Secondary | ICD-10-CM | POA: Insufficient documentation

## 2017-11-12 DIAGNOSIS — Z79899 Other long term (current) drug therapy: Secondary | ICD-10-CM | POA: Diagnosis not present

## 2017-11-12 DIAGNOSIS — J449 Chronic obstructive pulmonary disease, unspecified: Secondary | ICD-10-CM | POA: Insufficient documentation

## 2017-11-12 DIAGNOSIS — I10 Essential (primary) hypertension: Secondary | ICD-10-CM | POA: Diagnosis not present

## 2017-11-12 DIAGNOSIS — Z87891 Personal history of nicotine dependence: Secondary | ICD-10-CM | POA: Insufficient documentation

## 2017-11-12 DIAGNOSIS — D869 Sarcoidosis, unspecified: Secondary | ICD-10-CM | POA: Diagnosis not present

## 2017-11-12 DIAGNOSIS — G4733 Obstructive sleep apnea (adult) (pediatric): Secondary | ICD-10-CM | POA: Diagnosis not present

## 2017-11-12 DIAGNOSIS — F329 Major depressive disorder, single episode, unspecified: Secondary | ICD-10-CM | POA: Insufficient documentation

## 2017-11-12 DIAGNOSIS — M129 Arthropathy, unspecified: Secondary | ICD-10-CM | POA: Insufficient documentation

## 2017-11-12 DIAGNOSIS — Z86010 Personal history of colonic polyps: Secondary | ICD-10-CM | POA: Diagnosis not present

## 2017-11-12 DIAGNOSIS — K449 Diaphragmatic hernia without obstruction or gangrene: Secondary | ICD-10-CM | POA: Insufficient documentation

## 2017-11-12 DIAGNOSIS — I517 Cardiomegaly: Secondary | ICD-10-CM | POA: Insufficient documentation

## 2017-11-12 DIAGNOSIS — I700 Atherosclerosis of aorta: Secondary | ICD-10-CM | POA: Diagnosis not present

## 2017-11-12 DIAGNOSIS — C3412 Malignant neoplasm of upper lobe, left bronchus or lung: Secondary | ICD-10-CM | POA: Diagnosis not present

## 2017-11-12 DIAGNOSIS — F419 Anxiety disorder, unspecified: Secondary | ICD-10-CM | POA: Insufficient documentation

## 2017-11-12 DIAGNOSIS — Z9049 Acquired absence of other specified parts of digestive tract: Secondary | ICD-10-CM | POA: Insufficient documentation

## 2017-11-12 DIAGNOSIS — C3411 Malignant neoplasm of upper lobe, right bronchus or lung: Secondary | ICD-10-CM | POA: Diagnosis not present

## 2017-11-12 DIAGNOSIS — E785 Hyperlipidemia, unspecified: Secondary | ICD-10-CM | POA: Diagnosis not present

## 2017-11-12 MED ORDER — PEGFILGRASTIM-CBQV 6 MG/0.6ML ~~LOC~~ SOSY
PREFILLED_SYRINGE | SUBCUTANEOUS | Status: AC
  Filled 2017-11-12: qty 0.6

## 2017-11-12 MED ORDER — PEGFILGRASTIM-CBQV 6 MG/0.6ML ~~LOC~~ SOSY
6.0000 mg | PREFILLED_SYRINGE | Freq: Once | SUBCUTANEOUS | Status: AC
  Administered 2017-11-12: 6 mg via SUBCUTANEOUS

## 2017-11-12 NOTE — Patient Instructions (Signed)
Pegfilgrastim injection What is this medicine? PEGFILGRASTIM (PEG fil gra stim) is a long-acting granulocyte colony-stimulating factor that stimulates the growth of neutrophils, a type of white blood cell important in the body's fight against infection. It is used to reduce the incidence of fever and infection in patients with certain types of cancer who are receiving chemotherapy that affects the bone marrow, and to increase survival after being exposed to high doses of radiation. This medicine may be used for other purposes; ask your health care provider or pharmacist if you have questions. COMMON BRAND NAME(S): Neulasta What should I tell my health care provider before I take this medicine? They need to know if you have any of these conditions: -kidney disease -latex allergy -ongoing radiation therapy -sickle cell disease -skin reactions to acrylic adhesives (On-Body Injector only) -an unusual or allergic reaction to pegfilgrastim, filgrastim, other medicines, foods, dyes, or preservatives -pregnant or trying to get pregnant -breast-feeding How should I use this medicine? This medicine is for injection under the skin. If you get this medicine at home, you will be taught how to prepare and give the pre-filled syringe or how to use the On-body Injector. Refer to the patient Instructions for Use for detailed instructions. Use exactly as directed. Tell your healthcare provider immediately if you suspect that the On-body Injector may not have performed as intended or if you suspect the use of the On-body Injector resulted in a missed or partial dose. It is important that you put your used needles and syringes in a special sharps container. Do not put them in a trash can. If you do not have a sharps container, call your pharmacist or healthcare provider to get one. Talk to your pediatrician regarding the use of this medicine in children. While this drug may be prescribed for selected conditions,  precautions do apply. Overdosage: If you think you have taken too much of this medicine contact a poison control center or emergency room at once. NOTE: This medicine is only for you. Do not share this medicine with others. What if I miss a dose? It is important not to miss your dose. Call your doctor or health care professional if you miss your dose. If you miss a dose due to an On-body Injector failure or leakage, a new dose should be administered as soon as possible using a single prefilled syringe for manual use. What may interact with this medicine? Interactions have not been studied. Give your health care provider a list of all the medicines, herbs, non-prescription drugs, or dietary supplements you use. Also tell them if you smoke, drink alcohol, or use illegal drugs. Some items may interact with your medicine. This list may not describe all possible interactions. Give your health care provider a list of all the medicines, herbs, non-prescription drugs, or dietary supplements you use. Also tell them if you smoke, drink alcohol, or use illegal drugs. Some items may interact with your medicine. What should I watch for while using this medicine? You may need blood work done while you are taking this medicine. If you are going to need a MRI, CT scan, or other procedure, tell your doctor that you are using this medicine (On-Body Injector only). What side effects may I notice from receiving this medicine? Side effects that you should report to your doctor or health care professional as soon as possible: -allergic reactions like skin rash, itching or hives, swelling of the face, lips, or tongue -dizziness -fever -pain, redness, or irritation at site   where injected -pinpoint red spots on the skin -red or dark-brown urine -shortness of breath or breathing problems -stomach or side pain, or pain at the shoulder -swelling -tiredness -trouble passing urine or change in the amount of urine Side  effects that usually do not require medical attention (report to your doctor or health care professional if they continue or are bothersome): -bone pain -muscle pain This list may not describe all possible side effects. Call your doctor for medical advice about side effects. You may report side effects to FDA at 1-800-FDA-1088. Where should I keep my medicine? Keep out of the reach of children. Store pre-filled syringes in a refrigerator between 2 and 8 degrees C (36 and 46 degrees F). Do not freeze. Keep in carton to protect from light. Throw away this medicine if it is left out of the refrigerator for more than 48 hours. Throw away any unused medicine after the expiration date. NOTE: This sheet is a summary. It may not cover all possible information. If you have questions about this medicine, talk to your doctor, pharmacist, or health care provider.  2018 [employer] (2016-06-06 12:58:03)  

## 2017-11-12 NOTE — Progress Notes (Signed)
Radiation Oncology         [phone]) [phone] ________________________________  Name: Travis Ferguson.        MRN: [id number]  Date of Service: 11/12/2017 DOB: 1941/01/19  LZ:[id number], Elta Guadeloupe, MD  Curt Bears, MD     REFERRING PHYSICIAN: Curt Bears, MD   DIAGNOSIS: The encounter diagnosis was Stage IV squamous cell carcinoma of left lung ([place]).   HISTORY OF PRESENT ILLNESS: Travis Ferguson. is a 77 y.o. male seen at the request of Dr. Julien Nordmann for a recently diagnosed Stage IV lung cancer with a left upper lobe mass and left pleural effusion who was diagnosed with squamous cell carcinoma of the primary tumor and hw has begun [facility] and Taxol with Dr. Julien Nordmann. He underwent PET imaging yesterday revealing hypermetabolism of his 10 cm LUL tumor and a prevascular node. Of note his effusion was sampled with thoracentesis but revealed atypical cells during work up. He also had MRI brain yesterday which did not reveal acute findings or metastatic disease. He comes today to discuss options of palliative radiotherapy to the left upper lobe.    PREVIOUS RADIATION THERAPY: No   PAST MEDICAL HISTORY:  Past Medical History:  Diagnosis Date  . Acute on chronic combined systolic and diastolic CHF (congestive heart failure) ([place]) 02/24/2015  . Anxiety   . Arthritis    "about q joint I've got" (09/22/2015)  . Atherosclerosis   . CAP (community acquired pneumonia) 02/24/2015  . Cervical disc disease   . Chronic lower back pain    chronic back pain/under pain management  . Colon polyps 08/02/2014   Tubular adenoma x 3, Hyperplastic-1  . COPD (chronic obstructive pulmonary disease) ([place])    a. Former Airline pilot, also smoked a pipe.  . Coronary artery calcification seen on CAT scan   . Depression   . Dyslipidemia   . Dyspnea    chronic  . GERD (gastroesophageal reflux disease)   . Hiatal hernia    sensation problem ("right lateral side hip to knee").  . History of blood transfusion  "numerous"  . History of oxygen administration    @ 2 l/m nasally at bedtime.(09/22/2015)  . Hypertension   . Inguinal hernia    right side  . Migraine    "years since I've had one" (09/22/2015)  . OSA (obstructive sleep apnea)    does not use CPAP; "just oxygen" (09/22/2015)  . PTSD (post-traumatic stress disorder)   . Rhinitis   . Sarcoidosis   . Sensation problem    right side-lateral hip to knee" decreased sensation and tingling feeling" -has informed Dr. Joylene Draft, Dr. Henrene Pastor, Dr. Lucia Gaskins of this.  . Sepsis ([place])   . Steroid-induced hyperglycemia 02/25/2015  . Subdural hematoma ([place]) 11/10   a. 2010 s/p surgery - diagnosed several weeks after a fall.       PAST SURGICAL HISTORY: Past Surgical History:  Procedure Laterality Date  . BACK SURGERY    . BRAIN SURGERY  04/2009   right frontal"hemorrhage evacuation from fall injury"  . CARDIAC CATHETERIZATION N/A 01/23/2015   Procedure: Right/Left Heart Cath and Coronary Angiography;  Surgeon: Lorretta Harp, MD;  Location: [place] [facility];  Service: Cardiovascular;  Laterality: N/A;  . CHOLECYSTECTOMY N/A 11/08/2014   Procedure: LAPAROSCOPIC CHOLECYSTECTOMY ;  Surgeon: Alphonsa Overall, MD;  Location: [facility];  Service: General;  Laterality: N/A;  . ERCP N/A 11/07/2014   Procedure: ENDOSCOPIC RETROGRADE CHOLANGIOPANCREATOGRAPHY (ERCP);  Surgeon: Irene Shipper, MD;  Location: [facility];  Service: Endoscopy;  Laterality: N/A;  . GALLBLADDER SURGERY  09/2014   drain tube placed  . INGUINAL HERNIA REPAIR Left   . KNEE ARTHROSCOPY Right   . LUMBAR LAMINECTOMY/DECOMPRESSION MICRODISCECTOMY Right 12/15/2012   Procedure: LUMBAR LAMINECTOMY/DECOMPRESSION MICRODISCECTOMY 1 LEVEL;  Surgeon: Elaina Hoops, MD;  Location: [place] [facility];  Service: Neurosurgery;  Laterality: Right;  Right Lumbar four-five laminectomy/foraminotomy  . REVERSE SHOULDER ARTHROPLASTY Left 09/22/2015   Procedure: LEFT REVERSE TOTAL SHOULDER ARTHROPLASTY;  Surgeon: Netta Cedars, MD;   Location: [place];  Service: Orthopedics;  Laterality: Left;  . REVERSE TOTAL SHOULDER ARTHROPLASTY Left 09/22/2015  . VIDEO BRONCHOSCOPY  07/28/2012   Procedure: VIDEO BRONCHOSCOPY WITH FLUORO;  Surgeon: Chesley Mires, MD;  Location: [facility];  Service: Cardiopulmonary;  Laterality: Bilateral;  . VIDEO BRONCHOSCOPY Bilateral 10/17/2017   Procedure: VIDEO BRONCHOSCOPY WITH FLUORO;  Surgeon: Chesley Mires, MD;  Location[facility];  Service: Cardiopulmonary;  Laterality: Bilateral;     FAMILY HISTORY:  Family History  Problem Relation Age of Onset  . Heart disease Mother   . Heart disease Father   . Colon polyps Brother   . Congestive Heart Failure Brother   . Coronary artery disease Unknown   . Stomach cancer Paternal Grandmother   . Colon cancer Neg Hx   . Pancreatic cancer Neg Hx   . Rectal cancer Neg Hx   . Esophageal cancer Neg Hx      SOCIAL HISTORY:  reports that he quit smoking about 9 years ago. His smoking use included pipe. He smoked 0.00 packs per day for 30.00 years. He has never used smokeless tobacco. He reports that he does not drink alcohol or use drugs.The patient is married and lives in [place].    ALLERGIES: Statins   MEDICATIONS:  Current Outpatient Medications  Medication Sig Dispense Refill  . albuterol (PROVENTIL HFA;VENTOLIN HFA) 108 (90 Base) MCG/ACT inhaler 1-2 PUFFS EVERY 4-6 HOURS AS NEEDED FOR SHORTNESS OF BREATH 18 g 1  . amiodarone (PACERONE) 100 MG tablet Take 1 tablet (100 mg total) by mouth daily. 30 tablet 6  . apixaban (ELIQUIS) 5 MG TABS tablet Take 1 tablet (5 mg total) by mouth 2 (two) times daily. 60 tablet 0  . atorvastatin (LIPITOR) 20 MG tablet Take 1 tablet (20 mg total) by mouth daily at 6 PM. 30 tablet 0  . benzonatate (TESSALON) 200 MG capsule Take 1 capsule (200 mg total) by mouth 3 (three) times daily as needed for cough. 30 capsule 1  . budesonide-formoterol (SYMBICORT) 160-4.5 MCG/ACT inhaler Inhale 2 puffs into the lungs 2  (two) times daily. 1 Inhaler 0  . chlorhexidine (PERIDEX) 0.12 % solution chlorhexidine gluconate 0.12 % mouthwash    . DULoxetine (CYMBALTA) 30 MG capsule Take 30 mg by mouth daily.    [name] ENTRESTO 24-26 MG TAKE 1 TABLET BY MOUTH 2 (TWO) TIMES DAILY. 180 tablet 1  . finasteride (PROSCAR) 5 MG tablet Take 5 mg by mouth daily.     [name] FLUoxetine (PROZAC) 40 MG capsule Take 40 mg by mouth daily with breakfast.     . furosemide (LASIX) 20 MG tablet Take 1 tablet (20 mg total) by mouth 2 (two) times daily. 60 tablet 0  . HYDROcodone-acetaminophen (NORCO/VICODIN) 5-325 MG tablet Take 1 tablet by mouth every 8 (eight) hours as needed for moderate pain.     [name] ipratropium (ATROVENT) 0.02 % nebulizer solution Take 2.5 mLs (0.5 mg total) by nebulization 4 (four) times daily. 300 mL 12  .  levalbuterol (XOPENEX) 1.25 MG/0.5ML nebulizer solution Take 1.25 mg by nebulization every 8 (eight) hours as needed for wheezing or shortness of breath. 1 each 0  . metoprolol tartrate (LOPRESSOR) 25 MG tablet TAKE 1 TABLET (25 MG TOTAL) BY MOUTH 2 (TWO) TIMES DAILY. 90 tablet 1  . nystatin (MYCOSTATIN/NYSTOP) powder APPLY TO AFFECTED AREA 3 TIMES A DAY  4  . OXYGEN Inhale 2 L/min into the lungs at bedtime.    . prochlorperazine (COMPAZINE) 10 MG tablet Take 1 tablet (10 mg total) by mouth every 6 (six) hours as needed for nausea or vomiting. 30 tablet 0  . sodium chloride (OCEAN) 0.65 % SOLN nasal spray Place 1-2 sprays into both nostrils daily as needed for congestion.    [name] acetaminophen (TYLENOL) 325 MG tablet Take 650 mg by mouth every 6 (six) hours as needed for mild pain.    [name] guaiFENesin (MUCINEX) 600 MG 12 hr tablet Take 1 tablet (600 mg total) by mouth 2 (two) times daily. 60 tablet 2   No current facility-administered medications for this encounter.      REVIEW OF SYSTEMS: On review of systems, the patient reports that he is doing well overall. He denies any chest pain, shortness of breath though appears to be  slightly tachypneic, cough, fevers, chills, night sweats, unintended weight changes. He denies any bowel or bladder disturbances, and denies abdominal pain, or vomiting. He does describe nausea on a few occasions since his chemotherapy on Monday and antiemetics are helping his symptoms. He denies any new musculoskeletal or joint aches or pains. A complete review of systems is obtained and is otherwise negative.     PHYSICAL EXAM:  Wt Readings from Last 3 Encounters:  11/12/17 246 lb 3.2 oz (111.7 kg)  11/10/17 244 lb 3.2 oz (110.8 kg)  11/04/17 244 lb 9.6 oz (110.9 kg)   Temp Readings from Last 3 Encounters:  11/12/17 98.2 F (36.8 C) (Oral)  11/12/17 97.9 F (36.6 C) (Oral)  11/10/17 98.3 F (36.8 C) (Oral)   BP Readings from Last 3 Encounters:  11/12/17 (!) 86/55  11/12/17 110/70  11/10/17 126/82   Pulse Readings from Last 3 Encounters:  11/12/17 99  11/12/17 (!) 110  11/10/17 88   Pain Assessment Pain Score: 0-No pain/10  In general this is a well appearing caucasian male in no acute distress. He is alert and oriented x4 and appropriate throughout the examination. HEENT reveals that the patient is normocephalic, atraumatic. EOMs are intact. PERRLA. Skin is intact without any evidence of gross lesions. Cardiovascular exam reveals a regular rate and rhythm, no clicks rubs or murmurs are auscultated. Chest is clear to auscultation on the right, but decreased breath sounds are noted of the left lung throughout. Lymphatic assessment is performed and does not reveal any adenopathy in the cervical, supraclavicular, axillary, or inguinal chains. Abdomen has active bowel sounds in all quadrants and is intact. The abdomen is soft, non tender, non distended. Lower extremities are negative for pretibial pitting edema, deep calf tenderness, cyanosis or clubbing.   ECOG = 1  0 - Asymptomatic (Fully active, able to carry on all predisease activities without restriction)  1 - Symptomatic but  completely ambulatory (Restricted in physically strenuous activity but ambulatory and able to carry out work of a light or sedentary nature. For example, light housework, office work)  2 - Symptomatic, <50% in bed during the day (Ambulatory and capable of all self care but unable to carry out any work activities.  Up and about more than 50% of waking hours)  3 - Symptomatic, >50% in bed, but not bedbound (Capable of only limited self-care, confined to bed or chair 50% or more of waking hours)  4 - Bedbound (Completely disabled. Cannot carry on any self-care. Totally confined to bed or chair)  5 - Death   Eustace Pen MM, Creech RH, Tormey DC, et al. [phone]). "Toxicity and response criteria of the [facility]". [place] Oncol. 5 (6): 649-55    LABORATORY DATA:  Lab Results  Component Value Date   WBC 14.0 (H) 11/10/2017   HGB 11.6 (L) 11/10/2017   HCT 35.0 (L) 11/10/2017   MCV 80.3 11/10/2017   PLT 544 (H) 11/10/2017   Lab Results  Component Value Date   NA 134 (L) 11/10/2017   K 4.0 11/10/2017   CL 99 11/10/2017   CO2 22 11/10/2017   Lab Results  Component Value Date   ALT 11 11/10/2017   AST 12 11/10/2017   ALKPHOS 81 11/10/2017   BILITOT 0.7 11/10/2017      RADIOGRAPHY: Dg Chest 1 View  Result Date: 10/24/2017 CLINICAL DATA:  Shortness of breath.  Status post thoracentesis EXAM: CHEST  1 VIEW COMPARISON:  Study obtained earlier in the day FINDINGS: Left pleural effusion is smaller following thoracentesis. No pneumothorax. There remains consolidation in the left mid and lower lung zones with small left pleural effusion. Right lung is somewhat hyperexpanded but clear. Heart is upper normal in size with pulmonary vascularity within normal limits. No adenopathy. There is aortic atherosclerosis. There is a total shoulder replacement on the left. IMPRESSION: Left pleural effusion smaller following thoracentesis. No pneumothorax. Persistent left lower lobe  consolidation with volume loss. Small residual left pleural effusion. Right lung clear. Stable cardiac silhouette. Electronically Signed   By: Lowella Grip III M.D.   On: 10/24/2017 11:08   Dg Chest 2 View  Result Date: 10/23/2017 CLINICAL DATA:  Shortness of breath. Recently diagnosed with lung cancer. EXAM: CHEST - 2 VIEW COMPARISON:  10/17/2017 and 10/10/2017 CT FINDINGS: There is significant opacity in the LEFT mid lung zone and LEFT lung base, increased compared with most recent comparison. RIGHT lung is unremarkable. LEFT pleural effusion. Degenerative changes are seen in the thoracic spine. IMPRESSION: Progressive opacity in the LEFT lung, consistent with infectious process/atelectasis, tumor progression, or pleural effusion. Electronically Signed   By: Nolon Nations M.D.   On: 10/23/2017 15:06   Ct Angio Chest Pe W And/or Wo Contrast  Result Date: 10/23/2017 CLINICAL DATA:  History of lung carcinoma, short of breath today, lung sounds absent EXAM: CT ANGIOGRAPHY CHEST WITH CONTRAST TECHNIQUE: Multidetector CT imaging of the chest was performed using the standard protocol during bolus administration of intravenous contrast. Multiplanar CT image reconstructions and MIPs were obtained to evaluate the vascular anatomy. CONTRAST:  154mL ISOVUE-370 IOPAMIDOL (ISOVUE-370) INJECTION 76% COMPARISON:  Chest x-ray of 10/23/2016 and CT chest 10/10/2017 FINDINGS: Cardiovascular: Moderate cardiomegaly is stable. Diffuse coronary artery calcifications are noted. No pericardial effusion is seen. The thoracic aorta opacifies with no acute abnormality. The pulmonary arteries are well opacified and there is no evidence of acute pulmonary embolism. Mediastinum/Nodes: The previously noted left hilar mass appears to extend into the mediastinum compressing the left pulmonary vein consistent with progressive lung carcinoma. This mass on image 152 measures 68 x 101 mm compared to 72 x 81 mm previously. Pulmonary artery  branches do course through this soft tissue mass without evidence of pulmonary  embolism. On image 120 series 5 there is a left hilar node indenting the left main pulmonary artery measuring 17 mm in diameter. Anterior mediastinal nodes also are present which appear larger. On image 102 an anterior mediastinal prevascular node measures 14 mm compared to 11 mm previously. These nodes are worrisome for metastatic involvement of mediastinal and hilar lymph nodes. There also is a cluster of nodes precarinal to the right of midline measuring 18 mm, much larger than on the prior CT Lungs/Pleura: The volume of the moderate to large left pleural effusion has increased considerably in the interval resulting in more volume loss of the left upper lobe and left lower lobe. There is truncation of the bronchus to the left upper lobe with some air reaching the lingula and a portion of the left lower lobe. The right lung appears relatively well aerated. No definite right effusion is seen. Upper Abdomen: The liver is unremarkable with no evidence of a hepatic metastases on this exam. Surgical clips are noted from prior cholecystectomy. The pancreas is unremarkable although somewhat fatty infiltrated. Splenic artery calcifications are present. Musculoskeletal: There are degenerative changes throughout the thoracic spine. Mild compression deformities of several adjacent midthoracic vertebra appear stable. Review of the MIP images confirms the above findings. IMPRESSION: 1. 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 2. Progression of adenopathy involving the left hilum and mediastinum. 3. Increasing size of left pleural effusion. 4. No acute pulmonary embolism is seen. Electronically Signed   By: Ivar Drape M.D.   On: 10/23/2017 16:45   Mr Jeri Cos WU Contrast  Result Date: 11/11/2017 CLINICAL DATA:  Non-small cell lung cancer, staging. EXAM: MRI HEAD WITHOUT AND WITH CONTRAST TECHNIQUE: Multiplanar,  multiecho pulse sequences of the brain and surrounding structures were obtained without and with intravenous contrast. CONTRAST:  53mL MULTIHANCE GADOBENATE DIMEGLUMINE 529 MG/ML IV SOLN COMPARISON:  Most recent MRI of the brain 07/20/2016. FINDINGS: Brain: No evidence for acute infarction, hemorrhage, mass lesion, hydrocephalus, or extra-axial fluid. Generalized atrophy. Mild subcortical and periventricular T2 and FLAIR hyperintensities, likely chronic microvascular ischemic change. No abnormal enhancement of the brain. Minor dural enhancement on the RIGHT represents previous craniotomy for subdural hematoma. Vascular: Flow voids are maintained. Mild chronic hemorrhage of the subarachnoid spaces over the RIGHT frontal lobe. Skull and upper cervical spine: Partial empty sella. Marrow signal unremarkable. RIGHT frontotemporoparietal craniotomy appears uncomplicated. Sinuses/Orbits: No layering sinus fluid.  Negative orbits. Other: Unremarkable mastoid air cells. IMPRESSION: Generalized atrophy. Mild small vessel disease. Postsurgical changes following RIGHT subdural hematoma evacuation several years ago. No acute intracranial findings. No abnormal postcontrast enhancement of the brain to suggest intracranial metastatic disease. Electronically Signed   By: Staci Righter M.D.   On: 11/11/2017 16:56   Nm Pet Image Initial (pi) Skull Base To Thigh  Result Date: 11/11/2017 CLINICAL DATA:  Initial treatment strategy for lung cancer. EXAM: NUCLEAR MEDICINE PET SKULL BASE TO THIGH TECHNIQUE: 12.1 mCi F-18 FDG was injected intravenously. Full-ring PET imaging was performed from the skull base to thigh after the radiotracer. CT data was obtained and used for attenuation correction and anatomic localization. Fasting blood glucose: 132 mg/dl COMPARISON:  Chest CT 10/23/2017 FINDINGS: Mediastinal blood pool activity: SUV max 2.86 NECK: No hypermetabolic lymph nodes in the neck. Incidental CT findings: none CHEST: Large  necrotic central left lung mass invading the left hilum and mediastinum. Marked hypermetabolism with SUV max of 15.9. There is associated obstructed atelectatic left lower lobe and lingula. Large left pleural  effusion. No obvious pleural lesions. 12 mm hypermetabolic prevascular lymph node on image number 69 with SUV max of 7.7. There are also numerous calcified mediastinal and hilar lymph nodes consistent with patient's known sarcoidosis. I do not see any hypermetabolic contralateral adenopathy. No findings to suggest pulmonary metastatic disease. Calcified granuloma noted in the right middle lobe. Incidental CT findings: Extensive coronary artery calcifications. ABDOMEN/PELVIS: No abnormal hypermetabolic activity within the liver, pancreas, adrenal glands, or spleen. No hypermetabolic lymph nodes in the abdomen or pelvis. Incidental CT findings: none SKELETON: No focal hypermetabolic activity to suggest skeletal metastasis. Incidental CT findings: none IMPRESSION: 1. Large central left lung mass invading the hilum and mediastinum demonstrating marked hypermetabolism and consistent with known squamous cell carcinoma. Associated obstructed/atelectatic left lingula and lower lobe. Large left pleural effusion. 2. Hypermetabolic prevascular lymph node consistent with metastatic disease. 3. No findings for abdominal/pelvic metastatic disease or osseous metastatic disease. 4. Calcified mediastinal and hilar lymph nodes consistent with known sarcoidosis. Electronically Signed   By: Marijo Sanes M.D.   On: 11/11/2017 16:23   Dg Chest Port 1 View  Result Date: 10/24/2017 CLINICAL DATA:  Shortness of breath.  Difficulty breathing. EXAM: PORTABLE CHEST 1 VIEW COMPARISON:  CT 10/23/2017.  Chest x-ray 10/23/2017. FINDINGS: Stable cardiomegaly. Stable opacification left lung base and prominent left-sided pleural effusion. Reference is made to prior CT of 10/23/2017. No pneumothorax. Left shoulder replacement. IMPRESSION: 1.  Stable opacification of the left lung base and stable prominent left-sided pleural effusion. 2.  Stable cardiomegaly. Chest is unchanged from prior exam. Reference is made to prior CT report 10/23/2017. Electronically Signed   By: Marcello Moores  Register   On: 10/24/2017 09:59   Dg Chest Port 1 View  Result Date: 10/17/2017 CLINICAL DATA:  Hemoptysis status post bronchoscopy and lung biopsy. EXAM: PORTABLE CHEST 1 VIEW COMPARISON:  Chest x-ray dated October 09, 2017. FINDINGS: Stable cardiomegaly. Interval decrease in size of now small left pleural effusion. Persistent consolidation in the left lower lobe. No pneumothorax. No acute osseous abnormality. IMPRESSION: 1. Interval decrease in size of now small left pleural effusion. Persistent left lower lobe pneumonia. Electronically Signed   By: Titus Dubin M.D.   On: 10/17/2017 08:38   US Thoracentesis Asp Pleural Space W/img Guide  Result Date: 10/24/2017 INDICATION: Patient with history of recently diagnosed poorly differentiated non-small cell carcinoma of the left lung, dyspnea, recurrent left pleural effusion. Request made for diagnostic and therapeutic left thoracentesis. EXAM: ULTRASOUND GUIDED DIAGNOSTIC AND THERAPEUTIC LEFT THORACENTESIS MEDICATIONS: None COMPLICATIONS: None immediate. PROCEDURE: An ultrasound guided thoracentesis was thoroughly discussed with the patient and questions answered. The benefits, risks, alternatives and complications were also discussed. The patient understands and wishes to proceed with the procedure. Written consent was obtained. Ultrasound was performed to localize and mark an adequate pocket of fluid in the left chest. The area was then prepped and draped in the normal sterile fashion. 1% Lidocaine was used for local anesthesia. Under ultrasound guidance a 6 Fr Safe-T-Centesis catheter was introduced. Thoracentesis was performed. The catheter was removed and a dressing applied. FINDINGS: A total of approximately 1.3 liters  of hazy, yellow fluid was removed. Samples were sent to the laboratory as requested by the clinical team. IMPRESSION: Successful ultrasound guided diagnostic and therapeutic left thoracentesis yielding 1.3 liters of pleural fluid. Read by: Rowe Robert, PA-C Electronically Signed   By: Sandi Mariscal M.D.   On: 10/24/2017 10:54   Dg C-arm Bronchoscopy  Result Date: 10/17/2017 C-ARM BRONCHOSCOPY: Fluoroscopy was  utilized by the requesting physician.  No radiographic interpretation.       IMPRESSION/PLAN: 1. Stage IV NSCLC, squamous cell carcinoma of the left upper lobe. Dr. Lisbeth Renshaw discusses the pathology findings and reviews the nature of advanced lung cancer and the utility of palliative radiotherapy. We discussed the risks, benefits, short, and long term effects of radiotherapy, and the patient is interested in proceeding. Dr. Lisbeth Renshaw discusses the delivery and logistics of radiotherapy and anticipates a course of 3 weeks of radiotherapy given his ongoing systemic therapy. We will contact him to coordinate simulation in the next few days as well. Written consent is obtained and placed in the chart, a copy was provided to the patient.  2. Left Pleural Effusion. The patient is appearing clinically symptomatic and by his PET scan, so I will reach out to Dr. Julien Nordmann and Dr. Servando Snare to see how they'd like to proceed.  The above documentation reflects my direct findings during this shared patient visit. Please see the separate note by Dr. Lisbeth Renshaw on this date for the remainder of the patient's plan of care.    Carola Rhine, PAC

## 2017-11-13 ENCOUNTER — Ambulatory Visit
Admission: RE | Admit: 2017-11-13 | Discharge: 2017-11-13 | Disposition: A | Discharge status: Home / Self Care | Payer: Medicare Other | Source: Ambulatory Visit | Attending: Radiation Oncology | Admitting: Radiation Oncology

## 2017-11-13 DIAGNOSIS — C3432 Malignant neoplasm of lower lobe, left bronchus or lung: Secondary | ICD-10-CM | POA: Diagnosis present

## 2017-11-13 DIAGNOSIS — Z510 Encounter for antineoplastic radiation therapy: Secondary | ICD-10-CM | POA: Insufficient documentation

## 2017-11-13 NOTE — Progress Notes (Signed)
  Radiation Oncology         [phone]) [phone] ________________________________  Name: Travis Ferguson. MRN: [id number]  Date: 11/13/2017  DOB: 07-08-1940  SIMULATION AND TREATMENT PLANNING NOTE  DIAGNOSIS:     ICD-10-CM   1. Malignant neoplasm of bronchus of left lower lobe (HCC) C34.32      Site:  chest  NARRATIVE:  The patient was brought to the [facility].  Identity was confirmed.  All relevant records and images related to the planned course of therapy were reviewed.   Written consent to proceed with treatment was confirmed which was freely given after reviewing the details related to the planned course of therapy had been reviewed with the patient.  Then, the patient was set-up in a stable reproducible  supine position for radiation therapy.  CT images were obtained.  Surface markings were placed.    Medically necessary complex treatment device(s) for immobilization:  Customized vac-lock bag.   The CT images were loaded into the planning software.  Then the target and avoidance structures were contoured.  Treatment planning then occurred.  The radiation prescription was entered and confirmed.  A total of 3 complex treatment devices were fabricated which relate to the designed radiation treatment fields. Additional reduced fields will be used as necessary to improve the dose homogeneity of the plan. Each of these customized fields/ complex treatment devices will be used on a daily basis during the radiation course. I have requested : 3D Simulation  I have requested a DVH of the following structures: target volume, spinal cord, lungs, heart.   The patient will undergo daily image guidance to ensure accurate localization of the target, and adequate minimize dose to the normal surrounding structures in close proximity to the target.   PLAN:  The patient will receive 37.5 Gy in 15 fractions.   Special treatment procedure The patient will also receive concurrent  chemotherapy during the treatment. The patient may therefore experience increased toxicity or side effects and the patient will be monitored for such problems. This may require extra lab work as necessary. This therefore constitutes a special treatment procedure.   ________________________________   Jodelle Gross, MD, PhD

## 2017-11-14 DIAGNOSIS — C3432 Malignant neoplasm of lower lobe, left bronchus or lung: Secondary | ICD-10-CM | POA: Diagnosis not present

## 2017-11-17 NOTE — Progress Notes (Addendum)
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       11/10/2017 -  Chemotherapy    The patient had palonosetron (ALOXI) injection 0.25 mg, 0.25 mg, Intravenous,  Once, 1 of 4 cycles Administration: 0.25 mg (11/10/2017) pegfilgrastim-cbqv (UDENYCA) injection 6 mg, 6 mg, Subcutaneous, Once, 1 of 4 cycles Administration: 6 mg (11/12/2017) CARBOplatin (PARAPLATIN) 510 mg in sodium chloride 0.9 % 250 mL chemo infusion, 510 mg (100 % of original dose 513.5 mg), Intravenous,  Once, 1 of 4 cycles Dose modification: 513.5 mg (original dose 513.5 mg, Cycle 1) Administration: 510 mg (11/10/2017) PACLitaxel (TAXOL) 414 mg in sodium chloride 0.9 % 500 mL chemo infusion (> 80mg /m2), 175 mg/m2 = 414 mg, Intravenous,  Once, 1 of 4 cycles Administration: 414 mg (11/10/2017) fosaprepitant (EMEND) 150 mg, dexamethasone (DECADRON) 12 mg in sodium chloride 0.9 % 145 mL IVPB, , Intravenous,  Once, 1 of 4 cycles Administration:  (11/10/2017)  for chemotherapy treatment.      DIAGNOSIS: stage IIIB/IV (T4, N2, M0/M1)  non-small cell lung cancer, squamous cell carcinoma   PRIOR THERAPY: none   CURRENT THERAPY: Systemic chemotherapy with carboplatin AUC 5, paclitaxel 175 mg/m2 plus immunotherapy with Keytruda 200 mg IV every 3 weeks, started 11/10/17   INTERVAL HISTORY: Mr. Christoffel returns for follow-up 1 week following cycle 1 carbo/Taxol/Keytruda on 11/10/2017.  Chemo went better than he expected.  On day 2 after treatment he developed mild intermittent nausea controlled with antiemetics.  He took 2 stool softeners for constipation and developed diarrhea, 3 episodes in 24 hours. No blood in stool.  His appetite is decreased, eating half of his normal intake.  He reports feeling full quickly.  He has stable cough with intermittent clear white sputum, Tessalon helps.  Occasional chest discomfort to left low chest/axillary area.  He feels his dyspnea is slightly worse, no hemoptysis or wheezing.  He uses O2 as needed during the day and night.  He had one episode of bladder incontinence 1 day ago, this is new for him.  Has had long term history of spinal issues with numbness on the left side, now on the right.  Takes 3 to 4 tablets/day Norco for chronic pain.  He has increased instability and difficulty getting around at home, also related to respiratory status.  REVIEW OF SYSTEMS:   Constitutional: Denies fevers, chills or abnormal weight loss (+) decreased appetite (+) decreased mobility at home  Eyes: Denies blurriness of vision Ears, nose, mouth, throat, and face: Denies mucositis or sore throat Respiratory: Denies hemoptysis, wheezes (+) intermittent cough with clear/white sputum (+) increased dyspnea (+) supplemental  O2 PRN  Cardiovascular: Denies palpitation or lower extremity swelling (+) intermittent chest discomfort to left low chest/axilla  Gastrointestinal:  Denies heartburn or change in bowel habits (+) nausea (+) constipation (+) diarrhea  GU: (+) urinary incontinence x1 day  Skin: Denies abnormal skin  rashes Lymphatics: Denies new lymphadenopathy or easy bruising Neurological:Denies tingling or new weaknesses (+) intermittent left lower extremity numbness, now on right (+) increased agitation and confusion  Behavioral/Psych: Mood is stable, no new changes  MSK: (+) chronic back pain  All other systems were reviewed with the patient and are negative.  MEDICAL HISTORY:  Past Medical History:  Diagnosis Date  . Acute on chronic combined systolic and diastolic CHF (congestive heart failure) ([place]) 02/24/2015  . Anxiety   . Arthritis    "about q joint I've got" (09/22/2015)  . Atherosclerosis   . CAP (community acquired pneumonia) 02/24/2015  . Cervical disc disease   . Chronic lower back pain    chronic back pain/under pain management  . Colon polyps 08/02/2014   Tubular adenoma x 3, Hyperplastic-1  . COPD (chronic obstructive pulmonary disease) ([place])    a. Former Airline pilot, also smoked a pipe.  . Coronary artery calcification seen on CAT scan   . Depression   . Dyslipidemia   . Dyspnea    chronic  . GERD (gastroesophageal reflux disease)   . Hiatal hernia    sensation problem ("right lateral side hip to knee").  . History of blood transfusion "numerous"  . History of oxygen administration    @ 2 l/m nasally at bedtime.(09/22/2015)  . Hypertension   . Inguinal hernia    right side  . Migraine    "years since I've had one" (09/22/2015)  . OSA (obstructive sleep apnea)    does not use CPAP; "just oxygen" (09/22/2015)  . PTSD (post-traumatic stress disorder)   . Rhinitis   . Sarcoidosis   . Sensation problem    right side-lateral hip to knee" decreased sensation and tingling feeling" -has informed Dr. Joylene Draft, Dr. Henrene Pastor, Dr. Lucia Gaskins of this.  . Sepsis ([place])   . Steroid-induced hyperglycemia 02/25/2015  . Subdural hematoma ([place]) 11/10   a. 2010 s/p surgery - diagnosed several weeks after a fall.    SURGICAL HISTORY: Past Surgical History:  Procedure Laterality Date  . BACK  SURGERY    . BRAIN SURGERY  04/2009   right frontal"hemorrhage evacuation from fall injury"  . CARDIAC CATHETERIZATION N/A 01/23/2015   Procedure: Right/Left Heart Cath and Coronary Angiography;  Surgeon: Lorretta Harp, MD;  Location: [facility];  Service: Cardiovascular;  Laterality: N/A;  . CHOLECYSTECTOMY N/A 11/08/2014   Procedure: LAPAROSCOPIC CHOLECYSTECTOMY ;  Surgeon: Alphonsa Overall, MD;  Location: [facility];  Service: General;  Laterality: N/A;  . ERCP N/A 11/07/2014   Procedure: ENDOSCOPIC RETROGRADE CHOLANGIOPANCREATOGRAPHY (ERCP);  Surgeon: Irene Shipper, MD;  Location: [facility];  Service: Endoscopy;  Laterality: N/A;  . GALLBLADDER SURGERY  09/2014   drain tube placed  . INGUINAL HERNIA REPAIR Left   . KNEE ARTHROSCOPY Right   . LUMBAR LAMINECTOMY/DECOMPRESSION MICRODISCECTOMY Right 12/15/2012   Procedure: LUMBAR LAMINECTOMY/DECOMPRESSION MICRODISCECTOMY 1 LEVEL;  Surgeon: Elaina Hoops, MD;  Location: [facility];  Service: Neurosurgery;  Laterality: Right;  Right Lumbar four-five laminectomy/foraminotomy  . REVERSE SHOULDER ARTHROPLASTY Left 09/22/2015   Procedure: LEFT REVERSE TOTAL SHOULDER ARTHROPLASTY;  Surgeon: Netta Cedars, MD;  Location: [facility];  Service: Orthopedics;  Laterality: Left;  . REVERSE TOTAL SHOULDER ARTHROPLASTY  Left 09/22/2015  . VIDEO BRONCHOSCOPY  07/28/2012   Procedure: VIDEO BRONCHOSCOPY WITH FLUORO;  Surgeon: Chesley Mires, MD;  Location: [facility];  Service: Cardiopulmonary;  Laterality: Bilateral;  . VIDEO BRONCHOSCOPY Bilateral 10/17/2017   Procedure: VIDEO BRONCHOSCOPY WITH FLUORO;  Surgeon: Chesley Mires, MD;  Location: [facility];  Service: Cardiopulmonary;  Laterality: Bilateral;    I have reviewed the social history and family history with the patient and they are unchanged from previous note.  ALLERGIES:  is allergic to statins.  MEDICATIONS:  Current Outpatient Medications  Medication Sig Dispense Refill  . albuterol (PROVENTIL  HFA;VENTOLIN HFA) 108 (90 Base) MCG/ACT inhaler 1-2 PUFFS EVERY 4-6 HOURS AS NEEDED FOR SHORTNESS OF BREATH 18 g 1  . amiodarone (PACERONE) 100 MG tablet Take 1 tablet (100 mg total) by mouth daily. 30 tablet 6  . apixaban (ELIQUIS) 5 MG TABS tablet Take 1 tablet (5 mg total) by mouth 2 (two) times daily. 60 tablet 0  . atorvastatin (LIPITOR) 20 MG tablet Take 1 tablet (20 mg total) by mouth daily at 6 PM. 30 tablet 0  . benzonatate (TESSALON) 200 MG capsule Take 1 capsule (200 mg total) by mouth 3 (three) times daily as needed for cough. 30 capsule 1  . budesonide-formoterol (SYMBICORT) 160-4.5 MCG/ACT inhaler Inhale 2 puffs into the lungs 2 (two) times daily. 1 Inhaler 0  . chlorhexidine (PERIDEX) 0.12 % solution chlorhexidine gluconate 0.12 % mouthwash    . DULoxetine (CYMBALTA) 30 MG capsule Take 30 mg by mouth daily.    [name] ENTRESTO 24-26 MG TAKE 1 TABLET BY MOUTH 2 (TWO) TIMES DAILY. 180 tablet 1  . finasteride (PROSCAR) 5 MG tablet Take 5 mg by mouth daily.     [name] FLUoxetine (PROZAC) 40 MG capsule Take 40 mg by mouth daily with breakfast.     . furosemide (LASIX) 20 MG tablet Take 1 tablet (20 mg total) by mouth 2 (two) times daily. 60 tablet 0  . guaiFENesin (MUCINEX) 600 MG 12 hr tablet Take 1 tablet (600 mg total) by mouth 2 (two) times daily. 60 tablet 2  . HYDROcodone-acetaminophen (NORCO/VICODIN) 5-325 MG tablet Take 1 tablet by mouth every 8 (eight) hours as needed for moderate pain.     [name] ipratropium (ATROVENT) 0.02 % nebulizer solution Take 2.5 mLs (0.5 mg total) by nebulization 4 (four) times daily. 300 mL 12  . levalbuterol (XOPENEX) 1.25 MG/0.5ML nebulizer solution Take 1.25 mg by nebulization every 8 (eight) hours as needed for wheezing or shortness of breath. 1 each 0  . metoprolol tartrate (LOPRESSOR) 25 MG tablet TAKE 1 TABLET (25 MG TOTAL) BY MOUTH 2 (TWO) TIMES DAILY. (Patient taking differently: taking 1/2 tablet) 90 tablet 1  . nystatin (MYCOSTATIN/NYSTOP) powder APPLY TO  AFFECTED AREA 3 TIMES A DAY  4  . OXYGEN Inhale 2 L/min into the lungs at bedtime.    . prochlorperazine (COMPAZINE) 10 MG tablet Take 1 tablet (10 mg total) by mouth every 6 (six) hours as needed for nausea or vomiting. 30 tablet 0  . sodium chloride (OCEAN) 0.65 % SOLN nasal spray Place 1-2 sprays into both nostrils daily as needed for congestion.    . sulfamethoxazole-trimethoprim (BACTRIM DS,SEPTRA DS) 800-160 MG tablet Take 1 tablet by mouth 2 (two) times daily. 10 tablet 0   Current Facility-Administered Medications  Medication Dose Route Frequency Provider Last Rate Last Dose  . 0.9 %  sodium chloride infusion   Intravenous Continuous Alla Feeling, NP 500 mL/hr at 11/18/17  1440     Facility-Administered Medications Ordered in Other Visits  Medication Dose Route Frequency Provider Last Rate Last Dose  . 0.9 %  sodium chloride infusion   Intravenous Once Alla Feeling, NP        PHYSICAL EXAMINATION: ECOG PERFORMANCE STATUS: 3 - Symptomatic, >50% confined to bed  Vitals:   11/18/17 1009  BP: (!) 83/57  Pulse: (!) 110  Resp: 18  Temp: 97.8 F (36.6 C)  SpO2: 94%   Filed Weights   11/18/17 1009  Weight: 242 lb 9.6 oz (110 kg)    GENERAL: alert, no distress and comfortable SKIN: no rashes or significant lesions; generalized bruising to upper extremities  EYES: normal, Conjunctiva are pink and non-injected, sclera clear OROPHARYNX:no thrush or ulcers  LYMPH:  no palpable cervical or supraclavicular lymphadenopathy LUNGS: decreased left lung sounds with dullness to percussion; clear on right; normal breathing effort HEART: regular rate & rhythm and no murmurs and no lower extremity edema ABDOMEN:abdomen soft, round, non-tender and normal bowel sounds Musculoskeletal:no cyanosis of digits and no clubbing  NEURO: alert & oriented x 3 with fluent speech  LABORATORY DATA:  I have reviewed the data as listed CBC Latest Ref Rng & Units 11/18/2017 11/10/2017 11/04/2017  WBC  4.0 - 10.3 K/uL 18.2(H) 14.0(H) 14.8(H)  Hemoglobin 13.0 - 17.1 g/dL 11.0(L) 11.6(L) 11.7(L)  Hematocrit 38.4 - 49.9 % 32.8(L) 35.0(L) 35.3(L)  Platelets 140 - 400 K/uL 370 544(H) 516(H)     CMP Latest Ref Rng & Units 11/18/2017 11/10/2017 11/04/2017  Glucose 70 - 140 mg/dL 132 250(H) 135  BUN 7 - 26 mg/dL 23 18 14   Creatinine 0.70 - 1.30 mg/dL 1.33(H) 1.25 0.97  Sodium 136 - 145 mmol/L 133(L) 134(L) 134(L)  Potassium 3.5 - 5.1 mmol/L 3.9 4.0 4.0  Chloride 98 - 109 mmol/L 99 99 97(L)  CO2 22 - 29 mmol/L 21(L) 22 29  Calcium 8.4 - 10.4 mg/dL 9.1 9.5 9.6  Total Protein 6.4 - 8.3 g/dL 6.0(L) 6.4 6.5  Total Bilirubin 0.2 - 1.2 mg/dL 0.5 0.7 0.6  Alkaline Phos 40 - 150 U/L 86 81 83  AST 5 - 34 U/L 18 12 13   ALT 0 - 55 U/L 15 11 11      RADIOGRAPHIC STUDIES: I have personally reviewed the radiological images as listed and agreed with the findings in the report. No results found.   ASSESSMENT & PLAN: Mr. Badley is a 77 year old white male with stage IIIb non-small cell lung cancer, squamous cell carcinoma presented with large left lung mass in addition to mediastinal invasion as well as recurrent left pleural effusion diagnosed in April 2019.  He appears stable; he completed cycle 1 taxol/carboplatin/keytruda on 11/10/17. He tolerated chemotherapy moderately well. The patient was seen with Dr. Julien Nordmann who reviewed his staging work up, which is negative for brain metastasis; on PET scan his disease is confined to the chest without abdominopelvic or osseous metastasis. There is a large left pleural effusion, s/p multiple thoracentesis. He would likely benefit from repeat thoracentesis. The patient is agreeable. Will arrange.   For urinary incontinence, UA shows pyuria. Will treat empirically with bactrim 1 tab BID x5 days and await urine culture. BP remains low, he is tachycardic likely secondary to dehydration. Will support with 1 L NS over 2 hours today. I reinforced to hold lasix and metoprolol.  I will check with cardiology to see if he should hold entresto. I encouraged him to increase po liquids and hydrate  well. He will start imodium to control diarrhea. For chronic pain, he will continue management per PCP.   He began radiation therapy per Dr. Lisbeth Renshaw today, continue daily. F/u in 2 weeks with provider prior to cycle 2.    Orders Placed This Encounter  Procedures  . Urine Culture    Standing Status:   Future    Number of Occurrences:   1    Standing Expiration Date:   11/18/2018  . Urinalysis, Complete w Microscopic    Standing Status:   Future    Number of Occurrences:   1    Standing Expiration Date:   11/19/2018   All questions were answered. The patient knows to call the clinic with any problems, questions or concerns. No barriers to learning was detected.     Alla Feeling, NP 11/18/17   ADDENDUM: Hematology/Oncology Attending: I had a face-to-face encounter with the patient.  I recommended his care plan.  This is a very pleasant 77 years old white male recently diagnosed with stage IIIb non-small cell lung cancer presented with large left lung mass in addition to mediastinal invasion as well as recurrent left pleural effusion suspicious for malignancy.  The patient is currently undergoing systemic chemotherapy with carboplatin, paclitaxel and Keytruda started last week.  He tolerated the first week of his treatment fairly well. He is also scheduled to have short course of palliative radiotherapy to the large lung mass under the care of Dr. Lisbeth Renshaw. The patient had several studies performed recently including MRI of the brain that showed no concerning findings for.  Metastasis.  He also has a PET scan that confirmed the finding on the previous CT scan of the chest with the large obstructive left lung mass as well as the mediastinal lymphadenopathy and pleural effusion. I personally and independently reviewed the scan images and discussed the results with the patient and his  wife.  I recommended for him to continue his current treatment with chemotherapy. He has chronic back pain secondary to degenerative disc disease and he was advised to follow-up with his primary care physician and orthopedic surgeon for management of this issue. For the recurrent left pleural effusion, we will arrange for the patient to have ultrasound-guided diagnostic and therapeutic thoracentesis. For the dehydration, will arrange for the patient to receive IV hydration today. For the questionable urinary tract infection, will check urine analysis and start the patient empirically on Bactrim for 5 days. The patient will come back for follow-up visit in 2 weeks with the start of cycle #2 of his treatment. He was advised to call immediately if he has any concerning symptoms in the interval.  Disclaimer: This note was dictated with voice recognition software. Similar sounding words can inadvertently be transcribed and may be missed upon review. Eilleen Kempf, MD 11/19/17

## 2017-11-18 ENCOUNTER — Inpatient Hospital Stay: Payer: Medicare Other

## 2017-11-18 ENCOUNTER — Telehealth: Payer: Self-pay | Admitting: *Deleted

## 2017-11-18 ENCOUNTER — Encounter: Payer: Self-pay | Admitting: Nurse Practitioner

## 2017-11-18 ENCOUNTER — Ambulatory Visit
Admission: RE | Admit: 2017-11-18 | Discharge: 2017-11-18 | Disposition: A | Discharge status: Home / Self Care | Payer: Medicare Other | Source: Ambulatory Visit | Attending: Radiation Oncology | Admitting: Radiation Oncology

## 2017-11-18 ENCOUNTER — Other Ambulatory Visit: Payer: Medicare Other

## 2017-11-18 ENCOUNTER — Inpatient Hospital Stay (HOSPITAL_BASED_OUTPATIENT_CLINIC_OR_DEPARTMENT_OTHER): Payer: Medicare Other | Admitting: Nurse Practitioner

## 2017-11-18 VITALS — BP 91/59 | HR 109 | Resp 22

## 2017-11-18 VITALS — BP 83/57 | HR 110 | Temp 97.8°F | Resp 18 | Ht 71.0 in | Wt 242.6 lb

## 2017-11-18 DIAGNOSIS — R32 Unspecified urinary incontinence: Secondary | ICD-10-CM | POA: Diagnosis not present

## 2017-11-18 DIAGNOSIS — D869 Sarcoidosis, unspecified: Secondary | ICD-10-CM

## 2017-11-18 DIAGNOSIS — I509 Heart failure, unspecified: Secondary | ICD-10-CM

## 2017-11-18 DIAGNOSIS — Z5112 Encounter for antineoplastic immunotherapy: Secondary | ICD-10-CM | POA: Diagnosis not present

## 2017-11-18 DIAGNOSIS — C3490 Malignant neoplasm of unspecified part of unspecified bronchus or lung: Secondary | ICD-10-CM

## 2017-11-18 DIAGNOSIS — C3411 Malignant neoplasm of upper lobe, right bronchus or lung: Secondary | ICD-10-CM

## 2017-11-18 DIAGNOSIS — J449 Chronic obstructive pulmonary disease, unspecified: Secondary | ICD-10-CM

## 2017-11-18 DIAGNOSIS — C3492 Malignant neoplasm of unspecified part of left bronchus or lung: Secondary | ICD-10-CM

## 2017-11-18 DIAGNOSIS — I2510 Atherosclerotic heart disease of native coronary artery without angina pectoris: Secondary | ICD-10-CM | POA: Diagnosis not present

## 2017-11-18 DIAGNOSIS — Z5111 Encounter for antineoplastic chemotherapy: Secondary | ICD-10-CM | POA: Diagnosis not present

## 2017-11-18 DIAGNOSIS — F4310 Post-traumatic stress disorder, unspecified: Secondary | ICD-10-CM | POA: Diagnosis not present

## 2017-11-18 DIAGNOSIS — F418 Other specified anxiety disorders: Secondary | ICD-10-CM

## 2017-11-18 DIAGNOSIS — I4891 Unspecified atrial fibrillation: Secondary | ICD-10-CM

## 2017-11-18 DIAGNOSIS — J90 Pleural effusion, not elsewhere classified: Secondary | ICD-10-CM

## 2017-11-18 DIAGNOSIS — R197 Diarrhea, unspecified: Secondary | ICD-10-CM

## 2017-11-18 DIAGNOSIS — Z79899 Other long term (current) drug therapy: Secondary | ICD-10-CM

## 2017-11-18 DIAGNOSIS — G4730 Sleep apnea, unspecified: Secondary | ICD-10-CM

## 2017-11-18 DIAGNOSIS — K219 Gastro-esophageal reflux disease without esophagitis: Secondary | ICD-10-CM

## 2017-11-18 DIAGNOSIS — Z7689 Persons encountering health services in other specified circumstances: Secondary | ICD-10-CM

## 2017-11-18 DIAGNOSIS — E785 Hyperlipidemia, unspecified: Secondary | ICD-10-CM

## 2017-11-18 DIAGNOSIS — C3432 Malignant neoplasm of lower lobe, left bronchus or lung: Secondary | ICD-10-CM | POA: Diagnosis not present

## 2017-11-18 DIAGNOSIS — N3000 Acute cystitis without hematuria: Secondary | ICD-10-CM

## 2017-11-18 DIAGNOSIS — Z87891 Personal history of nicotine dependence: Secondary | ICD-10-CM

## 2017-11-18 LAB — CMP (CANCER CENTER ONLY)
ALT: 15 U/L (ref 0–55)
ANION GAP: 13 — AB (ref 3–11)
AST: 18 U/L (ref 5–34)
Albumin: 2.6 g/dL — ABNORMAL LOW (ref 3.5–5.0)
Alkaline Phosphatase: 86 U/L (ref 40–150)
BUN: 23 mg/dL (ref 7–26)
CO2: 21 mmol/L — ABNORMAL LOW (ref 22–29)
Calcium: 9.1 mg/dL (ref 8.4–10.4)
Chloride: 99 mmol/L (ref 98–109)
Creatinine: 1.33 mg/dL — ABNORMAL HIGH (ref 0.70–1.30)
GFR, EST AFRICAN AMERICAN: 58 mL/min — AB (ref >60)
GFR, Estimated: 50 mL/min — ABNORMAL LOW (ref >60)
GLUCOSE: 132 mg/dL (ref 70–140)
POTASSIUM: 3.9 mmol/L (ref 3.5–5.1)
SODIUM: 133 mmol/L — AB (ref 136–145)
TOTAL PROTEIN: 6 g/dL — AB (ref 6.4–8.3)
Total Bilirubin: 0.5 mg/dL (ref 0.2–1.2)

## 2017-11-18 LAB — URINALYSIS, COMPLETE (UACMP) WITH MICROSCOPIC
GLUCOSE, UA: NEGATIVE mg/dL
HGB URINE DIPSTICK: NEGATIVE
KETONES UR: 5 mg/dL — AB
NITRITE: NEGATIVE
PROTEIN: 30 mg/dL — AB
Specific Gravity, Urine: 1.025 (ref 1.005–1.030)
pH: 5 (ref 5.0–8.0)

## 2017-11-18 LAB — CBC WITH DIFFERENTIAL (CANCER CENTER ONLY)
BASOS PCT: 1 %
Basophils Absolute: 0.1 10*3/uL (ref 0.0–0.1)
Eosinophils Absolute: 0.8 10*3/uL — ABNORMAL HIGH (ref 0.0–0.5)
Eosinophils Relative: 5 %
HEMATOCRIT: 32.8 % — AB (ref 38.4–49.9)
HEMOGLOBIN: 11 g/dL — AB (ref 13.0–17.1)
LYMPHS PCT: 9 %
Lymphs Abs: 1.5 10*3/uL (ref 0.9–3.3)
MCH: 26.7 pg — ABNORMAL LOW (ref 27.2–33.4)
MCHC: 33.5 g/dL (ref 32.0–36.0)
MCV: 79.8 fL (ref 79.3–98.0)
MONO ABS: 1.1 10*3/uL — AB (ref 0.1–0.9)
Monocytes Relative: 6 %
NEUTROS ABS: 14.6 10*3/uL — AB (ref 1.5–6.5)
NEUTROS PCT: 79 %
Platelet Count: 370 10*3/uL (ref 140–400)
RBC: 4.11 MIL/uL — ABNORMAL LOW (ref 4.20–5.82)
RDW: 15.3 % — AB (ref 11.0–14.6)
WBC Count: 18.2 10*3/uL — ABNORMAL HIGH (ref 4.0–10.3)

## 2017-11-18 MED ORDER — SODIUM CHLORIDE 0.9 % IV SOLN
INTRAVENOUS | Status: DC
Start: 2017-11-18 — End: 2017-11-19
  Administered 2017-11-18: 15:00:00 via INTRAVENOUS

## 2017-11-18 MED ORDER — SODIUM CHLORIDE 0.9 % IV SOLN: Freq: Once | INTRAVENOUS | Status: DC

## 2017-11-18 MED ORDER — SULFAMETHOXAZOLE-TRIMETHOPRIM 800-160 MG PO TABS: 1.0000 | ORAL_TABLET | Freq: Two times a day (BID) | ORAL | 0 refills | Status: DC

## 2017-11-19 ENCOUNTER — Ambulatory Visit
Admission: RE | Admit: 2017-11-19 | Discharge: 2017-11-19 | Disposition: A | Discharge status: Home / Self Care | Payer: Medicare Other | Source: Ambulatory Visit | Attending: Radiation Oncology | Admitting: Radiation Oncology

## 2017-11-19 ENCOUNTER — Ambulatory Visit: Payer: Medicare Other

## 2017-11-19 DIAGNOSIS — C3432 Malignant neoplasm of lower lobe, left bronchus or lung: Secondary | ICD-10-CM | POA: Diagnosis not present

## 2017-11-19 LAB — URINE CULTURE: CULTURE: NO GROWTH

## 2017-11-20 ENCOUNTER — Other Ambulatory Visit: Payer: Self-pay

## 2017-11-20 ENCOUNTER — Inpatient Hospital Stay: Payer: Medicare Other

## 2017-11-20 ENCOUNTER — Emergency Department (HOSPITAL_COMMUNITY): Payer: Medicare Other

## 2017-11-20 ENCOUNTER — Other Ambulatory Visit: Payer: Self-pay | Admitting: Medical

## 2017-11-20 ENCOUNTER — Telehealth: Payer: Self-pay | Admitting: *Deleted

## 2017-11-20 ENCOUNTER — Other Ambulatory Visit: Payer: Self-pay | Admitting: Medical Oncology

## 2017-11-20 ENCOUNTER — Inpatient Hospital Stay (HOSPITAL_BASED_OUTPATIENT_CLINIC_OR_DEPARTMENT_OTHER): Payer: Medicare Other | Admitting: Medical

## 2017-11-20 ENCOUNTER — Ambulatory Visit: Payer: Medicare Other

## 2017-11-20 ENCOUNTER — Encounter (HOSPITAL_COMMUNITY): Payer: Self-pay

## 2017-11-20 ENCOUNTER — Inpatient Hospital Stay (HOSPITAL_COMMUNITY)
Admission: EM | Admit: 2017-11-20 | Discharge: 2017-12-01 | DRG: 180 | Disposition: A | Discharge status: Hospice | Payer: Medicare Other | Attending: Internal Medicine | Admitting: Internal Medicine

## 2017-11-20 ENCOUNTER — Ambulatory Visit
Admission: RE | Admit: 2017-11-20 | Discharge: 2017-11-20 | Disposition: A | Discharge status: Home / Self Care | Payer: Medicare Other | Source: Ambulatory Visit | Attending: Radiation Oncology | Admitting: Radiation Oncology

## 2017-11-20 DIAGNOSIS — N179 Acute kidney failure, unspecified: Secondary | ICD-10-CM | POA: Diagnosis present

## 2017-11-20 DIAGNOSIS — J9601 Acute respiratory failure with hypoxia: Secondary | ICD-10-CM | POA: Diagnosis present

## 2017-11-20 DIAGNOSIS — E785 Hyperlipidemia, unspecified: Secondary | ICD-10-CM | POA: Diagnosis present

## 2017-11-20 DIAGNOSIS — R197 Diarrhea, unspecified: Secondary | ICD-10-CM | POA: Diagnosis not present

## 2017-11-20 DIAGNOSIS — I5042 Chronic combined systolic (congestive) and diastolic (congestive) heart failure: Secondary | ICD-10-CM | POA: Diagnosis present

## 2017-11-20 DIAGNOSIS — K219 Gastro-esophageal reflux disease without esophagitis: Secondary | ICD-10-CM | POA: Diagnosis present

## 2017-11-20 DIAGNOSIS — Z9981 Dependence on supplemental oxygen: Secondary | ICD-10-CM | POA: Diagnosis not present

## 2017-11-20 DIAGNOSIS — R339 Retention of urine, unspecified: Secondary | ICD-10-CM | POA: Diagnosis not present

## 2017-11-20 DIAGNOSIS — I4891 Unspecified atrial fibrillation: Secondary | ICD-10-CM | POA: Diagnosis not present

## 2017-11-20 DIAGNOSIS — E669 Obesity, unspecified: Secondary | ICD-10-CM | POA: Diagnosis present

## 2017-11-20 DIAGNOSIS — Z87891 Personal history of nicotine dependence: Secondary | ICD-10-CM

## 2017-11-20 DIAGNOSIS — Z7189 Other specified counseling: Secondary | ICD-10-CM | POA: Diagnosis not present

## 2017-11-20 DIAGNOSIS — Z9221 Personal history of antineoplastic chemotherapy: Secondary | ICD-10-CM

## 2017-11-20 DIAGNOSIS — Z79899 Other long term (current) drug therapy: Secondary | ICD-10-CM | POA: Diagnosis not present

## 2017-11-20 DIAGNOSIS — Z66 Do not resuscitate: Secondary | ICD-10-CM | POA: Diagnosis present

## 2017-11-20 DIAGNOSIS — Z6834 Body mass index (BMI) 34.0-34.9, adult: Secondary | ICD-10-CM

## 2017-11-20 DIAGNOSIS — I2510 Atherosclerotic heart disease of native coronary artery without angina pectoris: Secondary | ICD-10-CM | POA: Diagnosis present

## 2017-11-20 DIAGNOSIS — Z7901 Long term (current) use of anticoagulants: Secondary | ICD-10-CM

## 2017-11-20 DIAGNOSIS — Z7951 Long term (current) use of inhaled steroids: Secondary | ICD-10-CM

## 2017-11-20 DIAGNOSIS — J189 Pneumonia, unspecified organism: Secondary | ICD-10-CM | POA: Diagnosis present

## 2017-11-20 DIAGNOSIS — J910 Malignant pleural effusion: Secondary | ICD-10-CM | POA: Diagnosis present

## 2017-11-20 DIAGNOSIS — I428 Other cardiomyopathies: Secondary | ICD-10-CM

## 2017-11-20 DIAGNOSIS — F4310 Post-traumatic stress disorder, unspecified: Secondary | ICD-10-CM | POA: Diagnosis present

## 2017-11-20 DIAGNOSIS — Z8249 Family history of ischemic heart disease and other diseases of the circulatory system: Secondary | ICD-10-CM

## 2017-11-20 DIAGNOSIS — G894 Chronic pain syndrome: Secondary | ICD-10-CM | POA: Diagnosis present

## 2017-11-20 DIAGNOSIS — Z923 Personal history of irradiation: Secondary | ICD-10-CM

## 2017-11-20 DIAGNOSIS — Z86711 Personal history of pulmonary embolism: Secondary | ICD-10-CM

## 2017-11-20 DIAGNOSIS — G4733 Obstructive sleep apnea (adult) (pediatric): Secondary | ICD-10-CM | POA: Diagnosis present

## 2017-11-20 DIAGNOSIS — J440 Chronic obstructive pulmonary disease with acute lower respiratory infection: Secondary | ICD-10-CM | POA: Diagnosis present

## 2017-11-20 DIAGNOSIS — I10 Essential (primary) hypertension: Secondary | ICD-10-CM | POA: Diagnosis not present

## 2017-11-20 DIAGNOSIS — R82998 Other abnormal findings in urine: Secondary | ICD-10-CM

## 2017-11-20 DIAGNOSIS — I429 Cardiomyopathy, unspecified: Secondary | ICD-10-CM | POA: Diagnosis present

## 2017-11-20 DIAGNOSIS — R61 Generalized hyperhidrosis: Secondary | ICD-10-CM

## 2017-11-20 DIAGNOSIS — C3492 Malignant neoplasm of unspecified part of left bronchus or lung: Secondary | ICD-10-CM | POA: Diagnosis present

## 2017-11-20 DIAGNOSIS — J90 Pleural effusion, not elsewhere classified: Secondary | ICD-10-CM | POA: Diagnosis not present

## 2017-11-20 DIAGNOSIS — J441 Chronic obstructive pulmonary disease with (acute) exacerbation: Secondary | ICD-10-CM | POA: Diagnosis not present

## 2017-11-20 DIAGNOSIS — I5031 Acute diastolic (congestive) heart failure: Secondary | ICD-10-CM | POA: Diagnosis not present

## 2017-11-20 DIAGNOSIS — R0602 Shortness of breath: Secondary | ICD-10-CM | POA: Diagnosis present

## 2017-11-20 DIAGNOSIS — Z515 Encounter for palliative care: Secondary | ICD-10-CM | POA: Diagnosis not present

## 2017-11-20 DIAGNOSIS — I272 Pulmonary hypertension, unspecified: Secondary | ICD-10-CM | POA: Diagnosis present

## 2017-11-20 DIAGNOSIS — I728 Aneurysm of other specified arteries: Secondary | ICD-10-CM | POA: Diagnosis present

## 2017-11-20 DIAGNOSIS — I110 Hypertensive heart disease with heart failure: Secondary | ICD-10-CM | POA: Diagnosis present

## 2017-11-20 DIAGNOSIS — I480 Paroxysmal atrial fibrillation: Secondary | ICD-10-CM | POA: Diagnosis present

## 2017-11-20 DIAGNOSIS — J9600 Acute respiratory failure, unspecified whether with hypoxia or hypercapnia: Secondary | ICD-10-CM

## 2017-11-20 DIAGNOSIS — I4892 Unspecified atrial flutter: Secondary | ICD-10-CM | POA: Diagnosis present

## 2017-11-20 DIAGNOSIS — Z800 Family history of malignant neoplasm of digestive organs: Secondary | ICD-10-CM | POA: Diagnosis not present

## 2017-11-20 DIAGNOSIS — I959 Hypotension, unspecified: Secondary | ICD-10-CM | POA: Diagnosis present

## 2017-11-20 DIAGNOSIS — J9621 Acute and chronic respiratory failure with hypoxia: Secondary | ICD-10-CM | POA: Diagnosis present

## 2017-11-20 DIAGNOSIS — R000 Tachycardia, unspecified: Secondary | ICD-10-CM

## 2017-11-20 DIAGNOSIS — D860 Sarcoidosis of lung: Secondary | ICD-10-CM | POA: Diagnosis present

## 2017-11-20 DIAGNOSIS — I2699 Other pulmonary embolism without acute cor pulmonale: Secondary | ICD-10-CM

## 2017-11-20 DIAGNOSIS — Z96612 Presence of left artificial shoulder joint: Secondary | ICD-10-CM | POA: Diagnosis present

## 2017-11-20 DIAGNOSIS — D649 Anemia, unspecified: Secondary | ICD-10-CM | POA: Diagnosis present

## 2017-11-20 HISTORY — DX: Paroxysmal atrial fibrillation: I48.0

## 2017-11-20 HISTORY — DX: Atherosclerotic heart disease of native coronary artery without angina pectoris: I25.10

## 2017-11-20 HISTORY — DX: Malignant neoplasm of unspecified part of unspecified bronchus or lung: C34.90

## 2017-11-20 HISTORY — DX: Pleural effusion, not elsewhere classified: J90

## 2017-11-20 HISTORY — DX: Chronic combined systolic (congestive) and diastolic (congestive) heart failure: I50.42

## 2017-11-20 HISTORY — DX: Unspecified atrial flutter: I48.92

## 2017-11-20 LAB — COMPREHENSIVE METABOLIC PANEL
ALK PHOS: 100 U/L (ref 38–126)
ALT: 15 U/L — ABNORMAL LOW (ref 17–63)
ANION GAP: 13 (ref 5–15)
AST: 20 U/L (ref 15–41)
Albumin: 2.9 g/dL — ABNORMAL LOW (ref 3.5–5.0)
BILIRUBIN TOTAL: 0.4 mg/dL (ref 0.3–1.2)
BUN: 22 mg/dL — ABNORMAL HIGH (ref 6–20)
CALCIUM: 8.7 mg/dL — AB (ref 8.9–10.3)
CO2: 24 mmol/L (ref 22–32)
Chloride: 101 mmol/L (ref 101–111)
Creatinine, Ser: 1.78 mg/dL — ABNORMAL HIGH (ref 0.61–1.24)
GFR calc non Af Amer: 35 mL/min — ABNORMAL LOW (ref >60)
GFR, EST AFRICAN AMERICAN: 41 mL/min — AB (ref >60)
Glucose, Bld: 161 mg/dL — ABNORMAL HIGH (ref 65–99)
POTASSIUM: 3.5 mmol/L (ref 3.5–5.1)
Sodium: 138 mmol/L (ref 135–145)
TOTAL PROTEIN: 6.3 g/dL — AB (ref 6.5–8.1)

## 2017-11-20 LAB — CBC WITH DIFFERENTIAL/PLATELET
BAND NEUTROPHILS: 11 %
BASOS ABS: 0 10*3/uL (ref 0.0–0.1)
BLASTS: 0 %
Basophils Relative: 0 %
EOS ABS: 0.3 10*3/uL (ref 0.0–0.7)
Eosinophils Relative: 1 %
HEMATOCRIT: 35.6 % — AB (ref 39.0–52.0)
HEMOGLOBIN: 11.9 g/dL — AB (ref 13.0–17.0)
LYMPHS ABS: 1 10*3/uL (ref 0.7–4.0)
Lymphocytes Relative: 3 %
MCH: 27.2 pg (ref 26.0–34.0)
MCHC: 33.4 g/dL (ref 30.0–36.0)
MCV: 81.3 fL (ref 78.0–100.0)
METAMYELOCYTES PCT: 2 %
MONOS PCT: 7 %
Monocytes Absolute: 2.4 10*3/uL — ABNORMAL HIGH (ref 0.1–1.0)
Myelocytes: 5 %
NEUTROS ABS: 30.3 10*3/uL — AB (ref 1.7–7.7)
Neutrophils Relative %: 71 %
Other: 0 %
Platelets: 371 10*3/uL (ref 150–400)
Promyelocytes Relative: 0 %
RBC: 4.38 MIL/uL (ref 4.22–5.81)
RDW: 15 % (ref 11.5–15.5)
WBC: 34 10*3/uL — ABNORMAL HIGH (ref 4.0–10.5)
nRBC: 0 /100 WBC

## 2017-11-20 LAB — HEPARIN LEVEL (UNFRACTIONATED)

## 2017-11-20 LAB — I-STAT CG4 LACTIC ACID, ED
LACTIC ACID, VENOUS: 2.61 mmol/L — AB (ref 0.5–1.9)
Lactic Acid, Venous: 1.14 mmol/L (ref 0.5–1.9)

## 2017-11-20 LAB — I-STAT TROPONIN, ED: TROPONIN I, POC: 0.04 ng/mL (ref 0.00–0.08)

## 2017-11-20 LAB — APTT: aPTT: 33 seconds (ref 24–36)

## 2017-11-20 LAB — BRAIN NATRIURETIC PEPTIDE: B NATRIURETIC PEPTIDE 5: 445 pg/mL — AB (ref 0.0–100.0)

## 2017-11-20 MED ORDER — AMIODARONE HCL IN DEXTROSE 360-4.14 MG/200ML-% IV SOLN
60.0000 mg/h | INTRAVENOUS | Status: AC
  Administered 2017-11-20 – 2017-11-21 (×2): 60 mg/h via INTRAVENOUS
  Filled 2017-11-20 (×2): qty 200

## 2017-11-20 MED ORDER — ALBUTEROL (5 MG/ML) CONTINUOUS INHALATION SOLN
15.0000 mg/h | INHALATION_SOLUTION | Freq: Once | RESPIRATORY_TRACT | Status: DC
  Filled 2017-11-20: qty 20

## 2017-11-20 MED ORDER — LEVALBUTEROL HCL 0.63 MG/3ML IN NEBU
0.6300 mg | INHALATION_SOLUTION | Freq: Four times a day (QID) | RESPIRATORY_TRACT | Status: DC
  Administered 2017-11-21 – 2017-12-01 (×40): 0.63 mg via RESPIRATORY_TRACT
  Filled 2017-11-20 (×37): qty 3

## 2017-11-20 MED ORDER — ACETAMINOPHEN 325 MG PO TABS: 650.0000 mg | ORAL_TABLET | Freq: Four times a day (QID) | ORAL | Status: DC | PRN

## 2017-11-20 MED ORDER — DILTIAZEM HCL 25 MG/5ML IV SOLN
10.0000 mg | Freq: Once | INTRAVENOUS | Status: AC
  Administered 2017-11-20: 10 mg via INTRAVENOUS
  Filled 2017-11-20 (×2): qty 5

## 2017-11-20 MED ORDER — SODIUM CHLORIDE 0.9 % IV BOLUS: 1000.0000 mL | Freq: Once | INTRAVENOUS | Status: DC

## 2017-11-20 MED ORDER — AMIODARONE HCL 200 MG PO TABS: 100.0000 mg | ORAL_TABLET | Freq: Every day | ORAL | Status: DC

## 2017-11-20 MED ORDER — METHYLPREDNISOLONE SODIUM SUCC 125 MG IJ SOLR
125.0000 mg | Freq: Once | INTRAMUSCULAR | Status: AC
  Administered 2017-11-20: 125 mg via INTRAVENOUS
  Filled 2017-11-20: qty 2

## 2017-11-20 MED ORDER — GUAIFENESIN ER 600 MG PO TB12
600.0000 mg | ORAL_TABLET | Freq: Two times a day (BID) | ORAL | Status: DC
  Administered 2017-11-20 – 2017-11-30 (×19): 600 mg via ORAL
  Filled 2017-11-20 (×19): qty 1

## 2017-11-20 MED ORDER — AMIODARONE HCL 150 MG/3ML IV SOLN
150.0000 mg | Freq: Once | INTRAVENOUS | Status: DC
  Filled 2017-11-20: qty 3

## 2017-11-20 MED ORDER — FLUOXETINE HCL 20 MG PO CAPS
40.0000 mg | ORAL_CAPSULE | Freq: Every day | ORAL | Status: DC
  Administered 2017-11-21 – 2017-11-30 (×9): 40 mg via ORAL
  Filled 2017-11-20 (×9): qty 2

## 2017-11-20 MED ORDER — HYDROCODONE-ACETAMINOPHEN 5-325 MG PO TABS
1.0000 | ORAL_TABLET | Freq: Three times a day (TID) | ORAL | Status: DC | PRN
  Administered 2017-11-20 – 2017-11-25 (×8): 1 via ORAL
  Filled 2017-11-20 (×8): qty 1

## 2017-11-20 MED ORDER — DULOXETINE HCL 30 MG PO CPEP
30.0000 mg | ORAL_CAPSULE | Freq: Every day | ORAL | Status: DC
  Administered 2017-11-21 – 2017-12-01 (×10): 30 mg via ORAL
  Filled 2017-11-20 (×10): qty 1

## 2017-11-20 MED ORDER — ATORVASTATIN CALCIUM 20 MG PO TABS
20.0000 mg | ORAL_TABLET | Freq: Every day | ORAL | Status: DC
  Administered 2017-11-21 – 2017-11-26 (×6): 20 mg via ORAL
  Filled 2017-11-20: qty 2
  Filled 2017-11-20 (×3): qty 1
  Filled 2017-11-20 (×2): qty 2

## 2017-11-20 MED ORDER — VANCOMYCIN HCL 10 G IV SOLR
1500.0000 mg | INTRAVENOUS | Status: DC
  Administered 2017-11-21: 1500 mg via INTRAVENOUS
  Filled 2017-11-20: qty 1500

## 2017-11-20 MED ORDER — LEVALBUTEROL HCL 0.63 MG/3ML IN NEBU
0.6300 mg | INHALATION_SOLUTION | Freq: Four times a day (QID) | RESPIRATORY_TRACT | Status: DC | PRN
  Administered 2017-11-23: 0.63 mg via RESPIRATORY_TRACT
  Filled 2017-11-20: qty 3

## 2017-11-20 MED ORDER — IOPAMIDOL (ISOVUE-370) INJECTION 76%
INTRAVENOUS | Status: AC
Start: 2017-11-20 — End: 2017-11-21
  Filled 2017-11-20: qty 100

## 2017-11-20 MED ORDER — DILTIAZEM HCL-DEXTROSE 100-5 MG/100ML-% IV SOLN (PREMIX)
5.0000 mg/h | Freq: Once | INTRAVENOUS | Status: AC
  Administered 2017-11-20: 5 mg/h via INTRAVENOUS
  Filled 2017-11-20: qty 100

## 2017-11-20 MED ORDER — SODIUM CHLORIDE 0.9 % IV SOLN
2.0000 g | Freq: Once | INTRAVENOUS | Status: AC
  Administered 2017-11-20: 2 g via INTRAVENOUS
  Filled 2017-11-20: qty 2

## 2017-11-20 MED ORDER — IOPAMIDOL (ISOVUE-370) INJECTION 76%
100.0000 mL | Freq: Once | INTRAVENOUS | Status: AC | PRN
  Administered 2017-11-20: 80 mL via INTRAVENOUS

## 2017-11-20 MED ORDER — ONDANSETRON HCL 4 MG PO TABS
4.0000 mg | ORAL_TABLET | Freq: Four times a day (QID) | ORAL | Status: DC | PRN
  Administered 2017-11-28: 4 mg via ORAL
  Filled 2017-11-20: qty 1

## 2017-11-20 MED ORDER — HEPARIN (PORCINE) IN NACL 100-0.45 UNIT/ML-% IJ SOLN
1600.0000 [IU]/h | INTRAMUSCULAR | Status: DC
  Administered 2017-11-20: 1600 [IU]/h via INTRAVENOUS
  Filled 2017-11-20: qty 250

## 2017-11-20 MED ORDER — ONDANSETRON HCL 4 MG/2ML IJ SOLN
4.0000 mg | Freq: Four times a day (QID) | INTRAMUSCULAR | Status: DC | PRN
  Administered 2017-11-23 – 2017-11-24 (×3): 4 mg via INTRAVENOUS
  Filled 2017-11-20 (×3): qty 2

## 2017-11-20 MED ORDER — AMIODARONE HCL IN DEXTROSE 360-4.14 MG/200ML-% IV SOLN
30.0000 mg/h | INTRAVENOUS | Status: DC
  Administered 2017-11-21 (×3): 30 mg/h via INTRAVENOUS
  Filled 2017-11-20 (×3): qty 200

## 2017-11-20 MED ORDER — SODIUM CHLORIDE 0.9 % IV BOLUS
2000.0000 mL | Freq: Once | INTRAVENOUS | Status: AC
  Administered 2017-11-20: 2000 mL via INTRAVENOUS

## 2017-11-20 MED ORDER — IPRATROPIUM BROMIDE 0.02 % IN SOLN
0.5000 mg | Freq: Once | RESPIRATORY_TRACT | Status: DC
  Filled 2017-11-20: qty 2.5

## 2017-11-20 MED ORDER — LIP MEDEX EX OINT
TOPICAL_OINTMENT | CUTANEOUS | Status: AC
  Administered 2017-11-20: 23:00:00
  Filled 2017-11-20: qty 7

## 2017-11-20 MED ORDER — LIDOCAINE HCL 1 % IJ SOLN
INTRAMUSCULAR | Status: AC
  Administered 2017-11-20: 21:00:00
  Filled 2017-11-20: qty 20

## 2017-11-20 MED ORDER — BUDESONIDE 0.25 MG/2ML IN SUSP
0.2500 mg | Freq: Two times a day (BID) | RESPIRATORY_TRACT | Status: DC
  Administered 2017-11-21 – 2017-12-01 (×22): 0.25 mg via RESPIRATORY_TRACT
  Filled 2017-11-20 (×21): qty 2

## 2017-11-20 MED ORDER — PIPERACILLIN-TAZOBACTAM 3.375 G IVPB
3.3750 g | Freq: Three times a day (TID) | INTRAVENOUS | Status: DC
  Administered 2017-11-21 – 2017-11-27 (×19): 3.375 g via INTRAVENOUS
  Filled 2017-11-20 (×18): qty 50

## 2017-11-20 MED ORDER — AMIODARONE LOAD VIA INFUSION
150.0000 mg | Freq: Once | INTRAVENOUS | Status: AC
  Administered 2017-11-20: 150 mg via INTRAVENOUS
  Filled 2017-11-20: qty 83.34

## 2017-11-20 MED ORDER — VANCOMYCIN HCL IN DEXTROSE 1-5 GM/200ML-% IV SOLN
1000.0000 mg | Freq: Once | INTRAVENOUS | Status: AC
  Administered 2017-11-20: 1000 mg via INTRAVENOUS
  Filled 2017-11-20: qty 200

## 2017-11-20 MED ORDER — MORPHINE SULFATE (PF) 4 MG/ML IV SOLN
4.0000 mg | Freq: Once | INTRAVENOUS | Status: AC
  Administered 2017-11-20: 4 mg via INTRAVENOUS
  Filled 2017-11-20: qty 1

## 2017-11-20 MED ORDER — ACETAMINOPHEN 650 MG RE SUPP: 650.0000 mg | Freq: Four times a day (QID) | RECTAL | Status: DC | PRN

## 2017-11-20 NOTE — Progress Notes (Signed)
Patient discussed with ER staff. Complex presentation in patient with advanced lung cancer presents with pneumonia, PE, and near complete obstruction of left mainstem bronchus with severe LUL and LLL atelectasis and hypoxia. In this setting he has gone in aflutter with RVR. He has a history of PAF on amio and eliquis at home. Hypotensive in ER. I have recommended IV amio load and drip, hold eliquis and heparin drip in case invasive procedures indicated. Full cardiology consult to follow tomorrow AM, at this time no additional recs. Would expect some degress of tachycardia given all his severe systemic illness, rates 130s or so would be expected.   Carlyle Dolly MD

## 2017-11-20 NOTE — ED Notes (Signed)
Patient transported to CT 

## 2017-11-20 NOTE — Telephone Encounter (Signed)
Wife Letta Median called reporting pt's has increased breathing difficulty.  Spoke with Letta Median, and was informed that pt en route for radiation appt at 2 pm today.   Stated pt was diagnosed with UTI; was given Bactrim 1 tab BID x 5 days ( started on  11/18/17 ).  Letta Median stated pt has tea colored urine. Pt also has increased difficulty in breathing - even with oxygen - not helping much.  Letta Median wanted to know if pt could be evaluated after radiation today.   Diane, Engineer, technical sales notified. Orie Fisherman to have radiation nurse called Dr. Worthy Flank nurse when pt completes radiation treatment today.

## 2017-11-20 NOTE — ED Notes (Signed)
ED TO INPATIENT HANDOFF REPORT  Name/Age/Gender Travis Ferguson. 78 y.o. male  Code Status Code Status History    Date Active Date Inactive Code Status Order ID Comments User Context   10/23/2017 2035 10/24/2017 1942 Full Code [id number]  Vertis Kelch, NP Inpatient   10/23/2017 1806 10/23/2017 2035 DNR [id number]  Doreatha Lew, MD ED   10/12/2017 1215 10/13/2017 2029 Partial Code [id number]  Florencia Reasons, MD Inpatient   10/09/2017 1848 10/12/2017 1215 Full Code [id number]  Kayleen Memos, DO Inpatient   09/02/2017 1835 09/08/2017 1820 Full Code [id number]  Karmen Bongo, MD ED   02/24/2015 1316 02/27/2015 1356 Full Code [id number]  Isaac Bliss, Rayford Halsted, MD ED   01/23/2015 0952 01/23/2015 2105 Full Code [id number]  Lorretta Harp, MD Inpatient   11/07/2014 1745 11/10/2014 1349 Full Code [id number]  Kinnie Feil, MD Inpatient   11/07/2014 1232 11/07/2014 1745 Full Code [id number]  Jacqulynn Cadet, MD Inpatient   08/16/2014 1255 08/25/2014 1740 Full Code [id number]  Donne Hazel, MD ED    Advance Directive Documentation     Most Recent Value  Type of Advance Directive  Healthcare Power of Attorney, Living will  Pre-existing out of facility DNR order (yellow form or pink MOST form)  -  "MOST" Form in Place?  -      Home/SNF/Other Home  Chief Complaint Shortness of breath; Hypertension; Back Pain  Level of Care/Admitting Diagnosis ED Disposition    ED Disposition Condition Comment   Admit  Hospital Area: Time [100102]  Level of Care: Stepdown [14]  Admit to SDU based on following criteria: Respiratory Distress:  Frequent assessment and/or intervention to maintain adequate ventilation/respiration, pulmonary toilet, and respiratory treatment.  Diagnosis: Acute respiratory failure with hypoxia [facility]) [606770]  Admitting Physician: Rise Patience [phone]  Attending Physician: Rise Patience [phone]  Estimated length of stay: past midnight tomorrow   Certification:: I certify this patient will need inpatient services for at least 2 midnights  PT Class (Do Not Modify): Inpatient [101]  PT Acc Code (Do Not Modify): Private [1]       Medical History Past Medical History:  Diagnosis Date  . Acute on chronic combined systolic and diastolic CHF (congestive heart failure) ([place]) 02/24/2015  . Anxiety   . Arthritis    "about q joint I've got" (09/22/2015)  . Atherosclerosis   . CAP (community acquired pneumonia) 02/24/2015  . Cervical disc disease   . Chronic lower back pain    chronic back pain/under pain management  . Colon polyps 08/02/2014   Tubular adenoma x 3, Hyperplastic-1  . COPD (chronic obstructive pulmonary disease) ([place])    a. Former Airline pilot, also smoked a pipe.  . Coronary artery calcification seen on CAT scan   . Depression   . Dyslipidemia   . Dyspnea    chronic  . GERD (gastroesophageal reflux disease)   . Hiatal hernia    sensation problem ("right lateral side hip to knee").  . History of blood transfusion "numerous"  . History of oxygen administration    @ 2 l/m nasally at bedtime.(09/22/2015)  . Hypertension   . Inguinal hernia    right side  . Migraine    "years since I've had one" (09/22/2015)  . OSA (obstructive sleep apnea)    does not use CPAP; "just oxygen" (09/22/2015)  . PTSD (post-traumatic stress disorder)   . Rhinitis   . Sarcoidosis   . Sensation problem  right side-lateral hip to knee" decreased sensation and tingling feeling" -has informed Dr. Joylene Draft, Dr. Henrene Pastor, Dr. Lucia Gaskins of this.  . Sepsis ([place])   . Steroid-induced hyperglycemia 02/25/2015  . Subdural hematoma ([place]) 11/10   a. 2010 s/p surgery - diagnosed several weeks after a fall.    Allergies Allergies  Allergen Reactions  . Statins Other (See Comments)    Muscle aches - tolerating Vytorin    IV Location/Drains/Wounds Patient Lines/Drains/Airways Status   Active Line/Drains/Airways    Name:   Placement date:   Placement time:    Site:   Days:   Peripheral IV 11/20/17 Right Antecubital   11/20/17    1646    Antecubital   less than 1          Labs/Imaging Results for orders placed or performed during the hospital encounter of 11/20/17 (from the past 48 hour(s))  CBC with Differential/Platelet     Status: Abnormal   Collection Time: 11/20/17  4:12 PM  Result Value Ref Range   WBC 34.0 (H) 4.0 - 10.5 K/uL   RBC 4.38 4.22 - 5.81 MIL/uL   Hemoglobin 11.9 (L) 13.0 - 17.0 g/dL   HCT 35.6 (L) 39.0 - 52.0 %   MCV 81.3 78.0 - 100.0 fL   MCH 27.2 26.0 - 34.0 pg   MCHC 33.4 30.0 - 36.0 g/dL   RDW 15.0 11.5 - 15.5 %   Platelets 371 150 - 400 K/uL   Neutrophils Relative % 71 %   Lymphocytes Relative 3 %   Monocytes Relative 7 %   Eosinophils Relative 1 %   Basophils Relative 0 %   Band Neutrophils 11 %   Metamyelocytes Relative 2 %   Myelocytes 5 %   Promyelocytes Relative 0 %   Blasts 0 %   nRBC 0 0 /100 WBC   Other 0 %   Neutro Abs 30.3 (H) 1.7 - 7.7 K/uL   Lymphs Abs 1.0 0.7 - 4.0 K/uL   Monocytes Absolute 2.4 (H) 0.1 - 1.0 K/uL   Eosinophils Absolute 0.3 0.0 - 0.7 K/uL   Basophils Absolute 0.0 0.0 - 0.1 K/uL   RBC Morphology POLYCHROMASIA PRESENT    WBC Morphology      MODERATE LEFT SHIFT (>5% METAS AND MYELOS,OCC PRO NOTED)    Comment: WHITE COUNT CONFIRMED ON SMEAR Performed at [facility] [address], [place], [place] [address]   Comprehensive metabolic panel     Status: Abnormal   Collection Time: 11/20/17  4:12 PM  Result Value Ref Range   Sodium 138 135 - 145 mmol/L   Potassium 3.5 3.5 - 5.1 mmol/L   Chloride 101 101 - 111 mmol/L   CO2 24 22 - 32 mmol/L   Glucose, Bld 161 (H) 65 - 99 mg/dL   BUN 22 (H) 6 - 20 mg/dL   Creatinine, Ser 1.78 (H) 0.61 - 1.24 mg/dL   Calcium 8.7 (L) 8.9 - 10.3 mg/dL   Total Protein 6.3 (L) 6.5 - 8.1 g/dL   Albumin 2.9 (L) 3.5 - 5.0 g/dL   AST 20 15 - 41 U/L   ALT 15 (L) 17 - 63 U/L   Alkaline Phosphatase 100 38 - 126 U/L   Total  Bilirubin 0.4 0.3 - 1.2 mg/dL   GFR calc non Af Amer 35 (L) >60 mL/min   GFR calc Af Amer 41 (L) >60 mL/min    Comment: (NOTE) The eGFR has been calculated using the CKD EPI equation.  This calculation has not been validated in all clinical situations. eGFR's persistently <60 mL/min signify possible Chronic Kidney Disease.    Anion gap 13 5 - 15    Comment: Performed at [facility], [place] [address], [place], [place] [address]  Brain natriuretic peptide     Status: Abnormal   Collection Time: 11/20/17  4:20 PM  Result Value Ref Range   B Natriuretic Peptide 445.0 (H) 0.0 - 100.0 pg/mL    Comment: Performed at [facility], [place] [address] [place], [place] [address]  I-stat troponin, ED     Status: None   Collection Time: 11/20/17  4:53 PM  Result Value Ref Range   Troponin i, poc 0.04 0.00 - 0.08 ng/mL   Comment 3            Comment: Due to the release kinetics of cTnI, a negative result within the first hours of the onset of symptoms does not rule out myocardial infarction with certainty. If myocardial infarction is still suspected, repeat the test at appropriate intervals.   I-Stat CG4 Lactic Acid, ED     Status: Abnormal   Collection Time: 11/20/17  4:56 PM  Result Value Ref Range   Lactic Acid, Venous 2.61 (HH) 0.5 - 1.9 mmol/L   Comment NOTIFIED PHYSICIAN   I-Stat CG4 Lactic Acid, ED     Status: None   Collection Time: 11/20/17  7:34 PM  Result Value Ref Range   Lactic Acid, Venous 1.14 0.5 - 1.9 mmol/L  APTT     Status: None   Collection Time: 11/20/17  8:28 PM  Result Value Ref Range   aPTT 33 24 - 36 seconds    Comment: Performed at [facility], [place] [address], [place], [place] [address]   Ct Angio Chest Pe W And/or Wo Contrast  Result Date: 11/20/2017 CLINICAL DATA:  Brought from cancer center post radiation treatment, Pt presents with dyspnea, diaphoretic, hypertensive and reported hypoxia, sp02 low 80s,  placed on 6lts at center. Sp02 on arrival 99 on 2ltr nasal canula. History of stage IV lung cancer. EXAM: CT ANGIOGRAPHY CHEST WITH CONTRAST TECHNIQUE: Multidetector CT imaging of the chest was performed using the standard protocol during bolus administration of intravenous contrast. Multiplanar CT image reconstructions and MIPs were obtained to evaluate the vascular anatomy. CONTRAST:  2m ISOVUE-370 IOPAMIDOL (ISOVUE-370) INJECTION 76% COMPARISON:  11/11/2017, 10/23/2017 FINDINGS: Cardiovascular: There is significant artifact from numerous calcified mediastinal and hilar lymph nodes. The pulmonary arteries are well opacified. There are filling defects within small RIGHT LOWER lobe pulmonary arteries consistent with pulmonary embolus. There is marked narrowing of the LEFT pulmonary artery secondary to mediastinal and hilar mass. There is obstruction of the LEFT pulmonary veins, stable in appearance. Coronary artery calcifications are present. Mediastinum/Nodes: Stable, large soft tissue mass involving the LEFT hilum and mediastinum. Lungs/Pleura: There is near complete obstruction of the LEFT mainstem bronchus. There is significant effacement of the RIGHT mainstem bronchus. There is near complete opacification of the LEFT lung with significantly less aerated lung in the UPPER lobe compared to prior studies. Findings are consistent with a combination of tumor and atelectasis. There are small amounts of pleural air anteriorly on the LEFT following thoracentesis earlier today. Moderate pleural effusion persists. Upper Abdomen: Cholecystectomy. Musculoskeletal: Degenerative changes in the thoracic spine. No suspicious lytic or blastic lesions. Review of the MIP images confirms the above findings. IMPRESSION: 1. Suspect small pulmonary emboli within the RIGHT LOWER lobe pulmonary  artery branches. 2. Near complete obstruction of the LEFT mainstem bronchus. This results in near complete atelectasis of the LEFT UPPER and  LOWER lobes. 3. Persistent moderate LEFT pleural effusion. 4. Extrinsic compression and resulting effacement of the LEFT pulmonary arteries. 5. Large mediastinal and LEFT hilar mass, stable in appearance. These results were called by telephone at the time of interpretation on 11/20/2017 at 6:43 pm to Dr. Shirlyn Goltz , who verbally acknowledged these results. Electronically Signed   By: Nolon Nations M.D.   On: 11/20/2017 18:46   Dg Chest Port 1 View  Result Date: 11/20/2017 CLINICAL DATA:  Post thoracentesis. EXAM: PORTABLE CHEST 1 VIEW COMPARISON:  Chest x-ray Nov 20, 2017 FINDINGS: Complete opacification left hemithorax remains. No definitive pneumothorax. The right lung remains clear. No other changes. IMPRESSION: Continued complete opacification of the left hemithorax without convincing evidence of pneumothorax. Electronically Signed   By: Dorise Bullion III M.D   On: 11/20/2017 18:45   Dg Chest Port 1 View  Result Date: 11/20/2017 CLINICAL DATA:  History of lung carcinoma EXAM: PORTABLE CHEST 1 VIEW COMPARISON:  PET-CT of 11/11/2017 and chest x-ray of 10/24/2017 FINDINGS: There is now near complete opacification of the left hemithorax with slight mediastinal shift to the right. This likely suggests a large right pleural effusion. Heart size is difficult to assess. The right lung is clear. Reverse left shoulder arthroplasty is noted. IMPRESSION: 1. Near complete opacification of the left hemithorax with some mediastinal shift to the right indicating a large left pleural effusion. 2. The right lung is grossly clear. Electronically Signed   By: Ivar Drape M.D.   On: 11/20/2017 17:04    Pending Labs Unresulted Labs (From admission, onward)   Start     Ordered   11/21/17 0500  CBC  Daily,   R     11/20/17 1859   11/20/17 1936  Heparin level (unfractionated)  STAT,   R     11/20/17 1935   11/20/17 1622  Blood culture (routine x 2)  BLOOD CULTURE X 2,   STAT     11/20/17 1621       Vitals/Pain Today's Vitals   11/20/17 1900 11/20/17 1901 11/20/17 2053 11/20/17 2101  BP:   102/83   Pulse:   (!) 141   Resp:   (!) 23   Temp:   97.9 F (36.6 C)   TempSrc:   Oral   SpO2: 91%  93% 92%  PainSc:  6  0-No pain     Isolation Precautions No active isolations  Medications Medications  albuterol (PROVENTIL,VENTOLIN) solution continuous neb (0 mg/hr Nebulization Hold 11/20/17 1829)  ipratropium (ATROVENT) nebulizer solution 0.5 mg (0 mg Nebulization Hold 11/20/17 1830)  vancomycin (VANCOCIN) IVPB 1000 mg/200 mL premix (has no administration in time range)  iopamidol (ISOVUE-370) 76 % injection (has no administration in time range)  amiodarone (NEXTERONE) 1.8 mg/mL load via infusion 150 mg (150 mg Intravenous Bolus from Bag 11/20/17 2050)    Followed by  amiodarone (NEXTERONE PREMIX) 360-4.14 MG/200ML-% (1.8 mg/mL) IV infusion (60 mg/hr Intravenous New Bag/Given 11/20/17 2051)    Followed by  amiodarone (NEXTERONE PREMIX) 360-4.14 MG/200ML-% (1.8 mg/mL) IV infusion (has no administration in time range)  morphine 4 MG/ML injection 4 mg (4 mg Intravenous Given 11/20/17 1647)  methylPREDNISolone sodium succinate (SOLU-MEDROL) 125 mg/2 mL injection 125 mg (125 mg Intravenous Given 11/20/17 1648)  sodium chloride 0.9 % bolus 2,000 mL (0 mLs Intravenous Stopped 11/20/17 2048)  diltiazem (CARDIZEM)  injection 10 mg (10 mg Intravenous Given 11/20/17 1850)  lidocaine (XYLOCAINE) 1 % (with pres) injection (  Given by Other 11/20/17 2058)  iopamidol (ISOVUE-370) 76 % injection 100 mL (80 mLs Intravenous Contrast Given 11/20/17 1756)  ceFEPIme (MAXIPIME) 2 g in sodium chloride 0.9 % 100 mL IVPB (0 g Intravenous Stopped 11/20/17 2048)  diltiazem (CARDIZEM) 100 mg in dextrose 5% 15m (1 mg/mL) infusion (0 mg/hr Intravenous Stopped 11/20/17 2051)    Mobility non-ambulatory at this time

## 2017-11-20 NOTE — Progress Notes (Signed)
Holding breathing tx per MD at this time due to HR 145-160's and hypotension.

## 2017-11-20 NOTE — ED Provider Notes (Addendum)
[facility] DEPT Provider Note   CSN: [id number] Arrival date & time: 11/20/17  1557     History   Chief Complaint Chief Complaint  Patient presents with  . Shortness of Breath  . Hypertension  . Back Pain    HPI Travis Ferguson. is a 77 y.o. male hx of CHF, anxiety, CAP, COPD, GERD, here presenting with shortness of breath.  Patient has been having worsening shortness of breath for the last 3 days.  Patient has a nonproductive cough as well.  Patient states that he has worsening sciatica symptoms in the low back as well but denies any incontinence.  Patient states that he has been using his oxygen more.  He is not supposed to be on any oxygen but has been using 2 L nasal cannula at home for the last several days.  Patient had radiation treatment today and has felt very short of breath and was noted to be hypoxic 88% on room air and sent here for evaluation.   The history is provided by the patient.    Past Medical History:  Diagnosis Date  . Acute on chronic combined systolic and diastolic CHF (congestive heart failure) ([place]) 02/24/2015  . Anxiety   . Arthritis    "about q joint I've got" (09/22/2015)  . Atherosclerosis   . CAP (community acquired pneumonia) 02/24/2015  . Cervical disc disease   . Chronic lower back pain    chronic back pain/under pain management  . Colon polyps 08/02/2014   Tubular adenoma x 3, Hyperplastic-1  . COPD (chronic obstructive pulmonary disease) ([place])    a. Former Airline pilot, also smoked a pipe.  . Coronary artery calcification seen on CAT scan   . Depression   . Dyslipidemia   . Dyspnea    chronic  . GERD (gastroesophageal reflux disease)   . Hiatal hernia    sensation problem ("right lateral side hip to knee").  . History of blood transfusion "numerous"  . History of oxygen administration    @ 2 l/m nasally at bedtime.(09/22/2015)  . Hypertension   . Inguinal hernia    right side  . Migraine    "years  since I've had one" (09/22/2015)  . OSA (obstructive sleep apnea)    does not use CPAP; "just oxygen" (09/22/2015)  . PTSD (post-traumatic stress disorder)   . Rhinitis   . Sarcoidosis   . Sensation problem    right side-lateral hip to knee" decreased sensation and tingling feeling" -has informed Dr. Joylene Draft, Dr. Henrene Pastor, Dr. Lucia Gaskins of this.  . Sepsis ([place])   . Steroid-induced hyperglycemia 02/25/2015  . Subdural hematoma ([place]) 11/10   a. 2010 s/p surgery - diagnosed several weeks after a fall.    Patient Active Problem List   Diagnosis Date Noted  . Stage IV squamous cell carcinoma of left lung ([place]) 11/04/2017  . Goals of care, counseling/discussion 11/04/2017  . Encounter for antineoplastic chemotherapy 11/04/2017  . Encounter for antineoplastic immunotherapy 11/04/2017  . Malignant pleural effusion 10/23/2017  . Malignant neoplasm of bronchus of left lower lobe ([place]) 10/22/2017  . CAD (coronary artery disease) 10/22/2017  . PAF (paroxysmal atrial fibrillation) ([place])   . DCM (dilated cardiomyopathy) ([place])   . Hypoxemia   . Lung mass   . HCAP (healthcare-associated pneumonia) 10/09/2017  . S/P thoracentesis   . Sepsis ([place]) 09/02/2017  . Tachycardia 09/02/2017  . Left lower lobe pneumonia ([place]) 09/02/2017  . Chronic pain syndrome 09/02/2017  . Typical atrial  flutter ([place])   . Nonischemic cardiomyopathy ([place])   . S/P shoulder replacement 09/22/2015  . Acute on chronic respiratory failure with hypoxia ([place]) 02/27/2015  . Dyspnea   . Acute on chronic combined systolic and diastolic CHF (congestive heart failure) ([place]) 02/24/2015  . CAP (community acquired pneumonia) 02/24/2015  . COPD with acute exacerbation ([place]) 02/24/2015  . Left ventricular dysfunction 01/20/2015  . S/P laparoscopic cholecystectomy 12/27/2014  . Other emphysema ([place])   . Pleural effusion 03/17/2014  . Splenic artery aneurysm ([place]) 04/27/2013  . Coronary artery calcification seen on CAT scan 04/26/2013  .  Essential hypertension 04/26/2013  . Hyperlipidemia 04/26/2013  . Post-nasal drip 03/15/2013  . Pulmonary sarcoidosis ([place]) 07/28/2012  . Obstructive sleep apnea 05/11/2007  . COPD with chronic bronchitis ([place]) 05/11/2007    Past Surgical History:  Procedure Laterality Date  . BACK SURGERY    . BRAIN SURGERY  04/2009   right frontal"hemorrhage evacuation from fall injury"  . CARDIAC CATHETERIZATION N/A 01/23/2015   Procedure: Right/Left Heart Cath and Coronary Angiography;  Surgeon: Lorretta Harp, MD;  Location: [facility];  Service: Cardiovascular;  Laterality: N/A;  . CHOLECYSTECTOMY N/A 11/08/2014   Procedure: LAPAROSCOPIC CHOLECYSTECTOMY ;  Surgeon: Alphonsa Overall, MD;  Location: [facility];  Service: General;  Laterality: N/A;  . ERCP N/A 11/07/2014   Procedure: ENDOSCOPIC RETROGRADE CHOLANGIOPANCREATOGRAPHY (ERCP);  Surgeon: Irene Shipper, MD;  Location: [facility];  Service: Endoscopy;  Laterality: N/A;  . GALLBLADDER SURGERY  09/2014   drain tube placed  . INGUINAL HERNIA REPAIR Left   . KNEE ARTHROSCOPY Right   . LUMBAR LAMINECTOMY/DECOMPRESSION MICRODISCECTOMY Right 12/15/2012   Procedure: LUMBAR LAMINECTOMY/DECOMPRESSION MICRODISCECTOMY 1 LEVEL;  Surgeon: Elaina Hoops, MD;  Location: [facility];  Service: Neurosurgery;  Laterality: Right;  Right Lumbar four-five laminectomy/foraminotomy  . REVERSE SHOULDER ARTHROPLASTY Left 09/22/2015   Procedure: LEFT REVERSE TOTAL SHOULDER ARTHROPLASTY;  Surgeon: Netta Cedars, MD;  Location: [facility];  Service: Orthopedics;  Laterality: Left;  . REVERSE TOTAL SHOULDER ARTHROPLASTY Left 09/22/2015  . VIDEO BRONCHOSCOPY  07/28/2012   Procedure: VIDEO BRONCHOSCOPY WITH FLUORO;  Surgeon: Chesley Mires, MD;  Location: [facility];  Service: Cardiopulmonary;  Laterality: Bilateral;  . VIDEO BRONCHOSCOPY Bilateral 10/17/2017   Procedure: VIDEO BRONCHOSCOPY WITH FLUORO;  Surgeon: Chesley Mires, MD;  Location: [facility];  Service: Cardiopulmonary;   Laterality: Bilateral;        Home Medications    Prior to Admission medications   Medication Sig Start Date End Date Taking? Authorizing Provider  albuterol (PROVENTIL HFA;VENTOLIN HFA) 108 (90 Base) MCG/ACT inhaler 1-2 PUFFS EVERY 4-6 HOURS AS NEEDED FOR SHORTNESS OF BREATH 10/20/17  Yes Chesley Mires, MD  amiodarone (PACERONE) 100 MG tablet Take 1 tablet (100 mg total) by mouth daily. 10/22/17  Yes Kilroy, Doreene Burke, PA-C  apixaban (ELIQUIS) 5 MG TABS tablet Take 1 tablet (5 mg total) by mouth 2 (two) times daily. 10/25/17  Yes Patrecia Pour, Christean Grief, MD  atorvastatin (LIPITOR) 20 MG tablet Take 1 tablet (20 mg total) by mouth daily at 6 PM. 09/08/17  Yes Dessa Phi, DO  benzonatate (TESSALON) 200 MG capsule Take 1 capsule (200 mg total) by mouth 3 (three) times daily as needed for cough. 09/22/17  Yes Chesley Mires, MD  budesonide-formoterol (SYMBICORT) 160-4.5 MCG/ACT inhaler Inhale 2 puffs into the lungs 2 (two) times daily. 07/15/17  Yes Parrett, Tammy S, NP  cholecalciferol (VITAMIN D) 1000 units tablet Take 2,000 Units by mouth daily.  Yes [provider]  DULoxetine (CYMBALTA) 30 MG capsule Take 30 mg by mouth daily.   Yes [provider]  ENTRESTO 24-26 MG TAKE 1 TABLET BY MOUTH 2 (TWO) TIMES DAILY. 05/14/16  Yes Lorretta Harp, MD  finasteride (PROSCAR) 5 MG tablet Take 5 mg by mouth daily.  01/07/13  Yes [provider]  FLUoxetine (PROZAC) 40 MG capsule Take 40 mg by mouth daily with breakfast.    Yes [provider]  guaiFENesin (MUCINEX) 600 MG 12 hr tablet Take 1 tablet (600 mg total) by mouth 2 (two) times daily. 10/13/17 10/13/18 Yes Florencia Reasons, MD  HYDROcodone-acetaminophen (NORCO/VICODIN) 5-325 MG tablet Take 1 tablet by mouth every 8 (eight) hours as needed for moderate pain.    Yes [provider]  Loperamide HCl (IMODIUM A-D PO) Take 2 tablets by mouth daily as needed (diarrhea).   Yes [provider]  nystatin  (MYCOSTATIN/NYSTOP) powder APPLY TO AFFECTED AREA 3 TIMES A DAY 10/30/17  Yes [provider]  OXYGEN Inhale 2 L/min into the lungs at bedtime.   Yes [provider]  Probiotic Product (PROBIOTIC PO) Take 1 tablet by mouth daily.   Yes [provider]  prochlorperazine (COMPAZINE) 10 MG tablet Take 1 tablet (10 mg total) by mouth every 6 (six) hours as needed for nausea or vomiting. 11/04/17  Yes Curt Bears, MD  sulfamethoxazole-trimethoprim (BACTRIM DS,SEPTRA DS) 800-160 MG tablet Take 1 tablet by mouth 2 (two) times daily. 11/18/17  Yes Alla Feeling, NP  furosemide (LASIX) 20 MG tablet Take 1 tablet (20 mg total) by mouth 2 (two) times daily. Patient not taking: Reported on 11/20/2017 09/08/17   Dessa Phi, DO  ipratropium (ATROVENT) 0.02 % nebulizer solution Take 2.5 mLs (0.5 mg total) by nebulization 4 (four) times daily. Patient not taking: Reported on 11/20/2017 07/15/17   Parrett, Fonnie Mu, NP  levalbuterol (XOPENEX) 1.25 MG/0.5ML nebulizer solution Take 1.25 mg by nebulization every 8 (eight) hours as needed for wheezing or shortness of breath. Patient not taking: Reported on 11/20/2017 10/13/17   Florencia Reasons, MD  metoprolol tartrate (LOPRESSOR) 25 MG tablet TAKE 1 TABLET (25 MG TOTAL) BY MOUTH 2 (TWO) TIMES DAILY. Patient not taking: Reported on 11/20/2017 09/11/15   Lorretta Harp, MD    Family History Family History  Problem Relation Age of Onset  . Heart disease Mother   . Heart disease Father   . Colon polyps Brother   . Congestive Heart Failure Brother   . Coronary artery disease Unknown   . Stomach cancer Paternal Grandmother   . Colon cancer Neg Hx   . Pancreatic cancer Neg Hx   . Rectal cancer Neg Hx   . Esophageal cancer Neg Hx     Social History Social History   Tobacco Use  . Smoking status: Former Smoker    Packs/day: 0.00    Years: 30.00    Pack years: 0.00    Types: Pipe    Last attempt to quit: 06/24/2008    Years since  quitting: 9.4  . Smokeless tobacco: Never Used  Substance Use Topics  . Alcohol use: No  . Drug use: No     Allergies   Statins   Review of Systems Review of Systems  Respiratory: Positive for shortness of breath.   Musculoskeletal: Positive for back pain.  All other systems reviewed and are negative.    Physical Exam Updated Vital Signs BP 96/71   Pulse 100  Temp 97.9 F (36.6 C)   Resp (!) 24   SpO2 91%   Physical Exam  Constitutional:  tachypneic   HENT:  Head: Normocephalic.  Mouth/Throat: Oropharynx is clear and moist.  Eyes: Pupils are equal, round, and reactive to light. EOM are normal.  Neck: Normal range of motion.  Cardiovascular:  Tachycardic   Pulmonary/Chest:  Tachypneic, + crackles L base. + wheezing throughout. Mild retractions   Abdominal: Soft. Bowel sounds are normal.  Musculoskeletal: Normal range of motion.       Right lower leg: Normal. He exhibits no tenderness and no edema.       Left lower leg: Normal. He exhibits no tenderness and no edema.  + straight leg raise on R side   Neurological: He is alert.  Skin: Skin is warm.  Psychiatric: He has a normal mood and affect.  Nursing note and vitals reviewed.    ED Treatments / Results  Labs (all labs ordered are listed, but only abnormal results are displayed) Labs Reviewed  CBC WITH DIFFERENTIAL/PLATELET - Abnormal; Notable for the following components:      Result Value   WBC 34.0 (*)    Hemoglobin 11.9 (*)    HCT 35.6 (*)    Neutro Abs 30.3 (*)    Monocytes Absolute 2.4 (*)    All other components within normal limits  COMPREHENSIVE METABOLIC PANEL - Abnormal; Notable for the following components:   Glucose, Bld 161 (*)    BUN 22 (*)    Creatinine, Ser 1.78 (*)    Calcium 8.7 (*)    Total Protein 6.3 (*)    Albumin 2.9 (*)    ALT 15 (*)    GFR calc non Af Amer 35 (*)    GFR calc Af Amer 41 (*)    All other components within normal limits  BRAIN NATRIURETIC PEPTIDE -  Abnormal; Notable for the following components:   B Natriuretic Peptide 445.0 (*)    All other components within normal limits  I-STAT CG4 LACTIC ACID, ED - Abnormal; Notable for the following components:   Lactic Acid, Venous 2.61 (*)    All other components within normal limits  CULTURE, BLOOD (ROUTINE X 2)  CULTURE, BLOOD (ROUTINE X 2)  CBC  APTT  HEPARIN LEVEL (UNFRACTIONATED)  I-STAT TROPONIN, ED  I-STAT CG4 LACTIC ACID, ED    EKG EKG Interpretation  Date/Time:  Thursday Nov 20 2017 16:39:25 EDT Ventricular Rate:  157 PR Interval:    QRS Duration: 92 QT Interval:  294 QTC Calculation: 476 R Axis:   52 Text Interpretation:  afib with RVR Low voltage, precordial leads Repolarization abnormality, prob rate related Confirmed by Wandra Arthurs [phone]) on 11/20/2017 7:00:27 PM   Radiology Ct Angio Chest Pe W And/or Wo Contrast  Result Date: 11/20/2017 CLINICAL DATA:  Brought from cancer center post radiation treatment, Pt presents with dyspnea, diaphoretic, hypertensive and reported hypoxia, sp02 low 80s, placed on 6lts at center. Sp02 on arrival 99 on 2ltr nasal canula. History of stage IV lung cancer. EXAM: CT ANGIOGRAPHY CHEST WITH CONTRAST TECHNIQUE: Multidetector CT imaging of the chest was performed using the standard protocol during bolus administration of intravenous contrast. Multiplanar CT image reconstructions and MIPs were obtained to evaluate the vascular anatomy. CONTRAST:  58mL ISOVUE-370 IOPAMIDOL (ISOVUE-370) INJECTION 76% COMPARISON:  11/11/2017, 10/23/2017 FINDINGS: Cardiovascular: There is significant artifact from numerous calcified mediastinal and hilar lymph nodes. The pulmonary arteries are well opacified. There are filling defects within small  RIGHT LOWER lobe pulmonary arteries consistent with pulmonary embolus. There is marked narrowing of the LEFT pulmonary artery secondary to mediastinal and hilar mass. There is obstruction of the LEFT pulmonary veins, stable  in appearance. Coronary artery calcifications are present. Mediastinum/Nodes: Stable, large soft tissue mass involving the LEFT hilum and mediastinum. Lungs/Pleura: There is near complete obstruction of the LEFT mainstem bronchus. There is significant effacement of the RIGHT mainstem bronchus. There is near complete opacification of the LEFT lung with significantly less aerated lung in the UPPER lobe compared to prior studies. Findings are consistent with a combination of tumor and atelectasis. There are small amounts of pleural air anteriorly on the LEFT following thoracentesis earlier today. Moderate pleural effusion persists. Upper Abdomen: Cholecystectomy. Musculoskeletal: Degenerative changes in the thoracic spine. No suspicious lytic or blastic lesions. Review of the MIP images confirms the above findings. IMPRESSION: 1. Suspect small pulmonary emboli within the RIGHT LOWER lobe pulmonary artery branches. 2. Near complete obstruction of the LEFT mainstem bronchus. This results in near complete atelectasis of the LEFT UPPER and LOWER lobes. 3. Persistent moderate LEFT pleural effusion. 4. Extrinsic compression and resulting effacement of the LEFT pulmonary arteries. 5. Large mediastinal and LEFT hilar mass, stable in appearance. These results were called by telephone at the time of interpretation on 11/20/2017 at 6:43 pm to Dr. Shirlyn Goltz , who verbally acknowledged these results. Electronically Signed   By: Nolon Nations M.D.   On: 11/20/2017 18:46   Dg Chest Port 1 View  Result Date: 11/20/2017 CLINICAL DATA:  Post thoracentesis. EXAM: PORTABLE CHEST 1 VIEW COMPARISON:  Chest x-ray Nov 20, 2017 FINDINGS: Complete opacification left hemithorax remains. No definitive pneumothorax. The right lung remains clear. No other changes. IMPRESSION: Continued complete opacification of the left hemithorax without convincing evidence of pneumothorax. Electronically Signed   By: Dorise Bullion III M.D   On: 11/20/2017  18:45   Dg Chest Port 1 View  Result Date: 11/20/2017 CLINICAL DATA:  History of lung carcinoma EXAM: PORTABLE CHEST 1 VIEW COMPARISON:  PET-CT of 11/11/2017 and chest x-ray of 10/24/2017 FINDINGS: There is now near complete opacification of the left hemithorax with slight mediastinal shift to the right. This likely suggests a large right pleural effusion. Heart size is difficult to assess. The right lung is clear. Reverse left shoulder arthroplasty is noted. IMPRESSION: 1. Near complete opacification of the left hemithorax with some mediastinal shift to the right indicating a large left pleural effusion. 2. The right lung is grossly clear. Electronically Signed   By: Ivar Drape M.D.   On: 11/20/2017 17:04    Procedures THORACENTESIS BEDSIDE Date/Time: 11/20/2017 6:58 PM Performed by: Drenda Freeze, MD Authorized by: Drenda Freeze, MD   Consent:    Consent obtained:  Verbal   Consent given by:  Patient   Risks discussed:  Bleeding, infection and pain   Alternatives discussed:  No treatment Anesthesia (see MAR for exact dosages):    Anesthesia method:  Local infiltration   Local anesthetic:  Lidocaine 2% w/o epi Procedure details:    Patient position:  Sitting   Location:  L midscapular line   Intercostal space:  6th   Puncture method:  Over-the-needle catheter   Ultrasound guidance: yes     Indwelling catheter placed: no     Needle gauge:  20   Catheter size:  14 G   Number of attempts:  1   Drainage characteristics:  Chylous Post-procedure details:    Chest x-ray  performed: yes     Chest x-ray findings:  Pleural effusion unchanged   Patient tolerance of procedure:  Tolerated well, no immediate complications   (including critical care time)  CRITICAL CARE Performed by: Wandra Arthurs   Total critical care time: 45 minutes  Critical care time was exclusive of separately billable procedures and treating other patients.  Critical care was necessary to treat or  prevent imminent or life-threatening deterioration.  Critical care was time spent personally by me on the following activities: development of treatment plan with patient and/or surrogate as well as nursing, discussions with consultants, evaluation of patient's response to treatment, examination of patient, obtaining history from patient or surrogate, ordering and performing treatments and interventions, ordering and review of laboratory studies, ordering and review of radiographic studies, pulse oximetry and re-evaluation of patient's condition.     Medications Ordered in ED Medications  albuterol (PROVENTIL,VENTOLIN) solution continuous neb (0 mg/hr Nebulization Hold 11/20/17 1829)  ipratropium (ATROVENT) nebulizer solution 0.5 mg (0 mg Nebulization Hold 11/20/17 1830)  lidocaine (XYLOCAINE) 1 % (with pres) injection (has no administration in time range)  vancomycin (VANCOCIN) IVPB 1000 mg/200 mL premix (has no administration in time range)  iopamidol (ISOVUE-370) 76 % injection (has no administration in time range)  amiodarone (CORDARONE) injection 150 mg (has no administration in time range)  morphine 4 MG/ML injection 4 mg (4 mg Intravenous Given 11/20/17 1647)  methylPREDNISolone sodium succinate (SOLU-MEDROL) 125 mg/2 mL injection 125 mg (125 mg Intravenous Given 11/20/17 1648)  sodium chloride 0.9 % bolus 2,000 mL (2,000 mLs Intravenous New Bag/Given 11/20/17 1657)  diltiazem (CARDIZEM) injection 10 mg (10 mg Intravenous Given 11/20/17 1850)  iopamidol (ISOVUE-370) 76 % injection 100 mL (80 mLs Intravenous Contrast Given 11/20/17 1756)  ceFEPIme (MAXIPIME) 2 g in sodium chloride 0.9 % 100 mL IVPB (2 g Intravenous New Bag/Given 11/20/17 1853)  diltiazem (CARDIZEM) 100 mg in dextrose 5% 176mL (1 mg/mL) infusion (5 mg/hr Intravenous New Bag/Given 11/20/17 1934)     Initial Impression / Assessment and Plan / ED Course  I have reviewed the triage vital signs and the nursing notes.  Pertinent  labs & imaging results that were available during my care of the patient were reviewed by me and considered in my medical decision making (see chart for details).     Travis Bink. is a 77 y.o. male here with SOB, hypoxia. Patient has stage 4 lung cancer on radiation. Has been short of breath and coughing for several days. Had recent admission for pneumonia, pleural effusion requiring thoracentesis. Also has hx of COPD. Consider COPD exacerbation vs worsening cancer vs pneumonia vs PE. Will get labs, lactate, cultures, CXR, CTA chest. Will give continuous neb, steroids.    6 pm Patient remained tachycardic and hypotensive in the ED. Sepsis workup initiated. Unable to give nebs due to tachycardia. Patient afib with RVR. CXR showed large L pleural effusion and I am concerned that his hypotension may be from large pleural effusion. I performed thoracentesis and took out 500 cc fluid.   7 pm CTA showed R sided PE. Also large L pleural effusion. Post thoracentesis xray showed no pneumothorax, still large effusion. Breathing slightly improved. Given cardizem and HR still in the 120s, BP improved to 80s. Started on heparin drip. Hospitalist to admit for hypoxia from HCAP, large pleural effusion, rapid afib with RVR, new PE.   8:14 PM Given his hypotension, hospitalist request cardiology consult for HR management. I called Dr. Harl Bowie from  cardiology, who recommend amiodarone bolus and drip. Will dc cardizem drip for now. Hospitalist aware. Cardiology to follow tomorrow as consult.   Final Clinical Impressions(s) / ED Diagnoses   Final diagnoses:  Atrial fibrillation with RVR ([place])  HCAP (healthcare-associated pneumonia)  Pleural effusion  Acute pulmonary embolism without acute cor pulmonale, unspecified pulmonary embolism type [facility])    ED Discharge Orders    None       Drenda Freeze, MD 11/20/17 [place] Iha    Drenda Freeze, MD 11/20/17 2015

## 2017-11-20 NOTE — H&P (Signed)
History and Physical    [name]. ASN:[id number] DOB: 1941-02-18 DOA: 11/20/2017  PCP: Crist Infante, MD  Patient coming from: Home.  Chief Complaint: Shortness of breath.  HPI: Travis Coye. is a 77 y.o. male with history of recently diagnosed non-small cell lung cancer receiving radiation therapy, chronic combined CHF last EF measured this month was 50 to 55% with grade 1 diastolic dysfunction, atrial fibrillation sleep apnea, sarcoidosis who was brought to the ER after radiation therapy since patient became acutely short of breath.  Patient states he has been getting more short of breath last 2 days and has been having low blood pressure and patient oncologist discontinued Lasix and metoprolol.  Patient gets short of breath easily on exertion and has been feeling dizzy on standing.  Denies any chest pain.  Has benign low back pain which patient states has been chronic.  Patient was admitted early part of this month 4 weeks ago and had thoracentesis done.  ED Course: In the ER patient is found to be hypotensive and was in A. fib with RVR.  Cardiology was consulted and patient was started on amiodarone drip.  CT angiogram of the chest done showed small pulmonary embolism with moderate left pleural effusion and near total obstruction of the left main bronchus.  After discussing with cardiology patient was started on amiodarone drip and heparin infusion.  Patient had bedside thoracentesis done by the ER physician and 500 cc fluid was removed following the patient's breathing improved but still short of breath.  On exam patient also has been having some wheezing.  Patient admitted for acute respiratory failure and A. fib with RVR.  Review of Systems: As per HPI, rest all negative.   Past Medical History:  Diagnosis Date  . Acute on chronic combined systolic and diastolic CHF (congestive heart failure) ([place]) 02/24/2015  . Anxiety   . Arthritis    "about q joint I've got" (09/22/2015)    . Atherosclerosis   . CAP (community acquired pneumonia) 02/24/2015  . Cervical disc disease   . Chronic lower back pain    chronic back pain/under pain management  . Colon polyps 08/02/2014   Tubular adenoma x 3, Hyperplastic-1  . COPD (chronic obstructive pulmonary disease) ([place])    a. Former Airline pilot, also smoked a pipe.  . Coronary artery calcification seen on CAT scan   . Depression   . Dyslipidemia   . Dyspnea    chronic  . GERD (gastroesophageal reflux disease)   . Hiatal hernia    sensation problem ("right lateral side hip to knee").  . History of blood transfusion "numerous"  . History of oxygen administration    @ 2 l/m nasally at bedtime.(09/22/2015)  . Hypertension   . Inguinal hernia    right side  . Migraine    "years since I've had one" (09/22/2015)  . OSA (obstructive sleep apnea)    does not use CPAP; "just oxygen" (09/22/2015)  . PTSD (post-traumatic stress disorder)   . Rhinitis   . Sarcoidosis   . Sensation problem    right side-lateral hip to knee" decreased sensation and tingling feeling" -has informed Dr. Joylene Draft, Dr. Henrene Pastor, Dr. Lucia Gaskins of this.  . Sepsis ([place])   . Steroid-induced hyperglycemia 02/25/2015  . Subdural hematoma ([place]) 11/10   a. 2010 s/p surgery - diagnosed several weeks after a fall.    Past Surgical History:  Procedure Laterality Date  . BACK SURGERY    . BRAIN SURGERY  04/2009   right frontal"hemorrhage evacuation from fall injury"  . CARDIAC CATHETERIZATION N/A 01/23/2015   Procedure: Right/Left Heart Cath and Coronary Angiography;  Surgeon: Lorretta Harp, MD;  Location: [facility];  Service: Cardiovascular;  Laterality: N/A;  . CHOLECYSTECTOMY N/A 11/08/2014   Procedure: LAPAROSCOPIC CHOLECYSTECTOMY ;  Surgeon: Alphonsa Overall, MD;  Location: [facility];  Service: General;  Laterality: N/A;  . ERCP N/A 11/07/2014   Procedure: ENDOSCOPIC RETROGRADE CHOLANGIOPANCREATOGRAPHY (ERCP);  Surgeon: Irene Shipper, MD;  Location: [facility];   Service: Endoscopy;  Laterality: N/A;  . GALLBLADDER SURGERY  09/2014   drain tube placed  . INGUINAL HERNIA REPAIR Left   . KNEE ARTHROSCOPY Right   . LUMBAR LAMINECTOMY/DECOMPRESSION MICRODISCECTOMY Right 12/15/2012   Procedure: LUMBAR LAMINECTOMY/DECOMPRESSION MICRODISCECTOMY 1 LEVEL;  Surgeon: Elaina Hoops, MD;  Location: [facility];  Service: Neurosurgery;  Laterality: Right;  Right Lumbar four-five laminectomy/foraminotomy  . REVERSE SHOULDER ARTHROPLASTY Left 09/22/2015   Procedure: LEFT REVERSE TOTAL SHOULDER ARTHROPLASTY;  Surgeon: Netta Cedars, MD;  Location: [facility];  Service: Orthopedics;  Laterality: Left;  . REVERSE TOTAL SHOULDER ARTHROPLASTY Left 09/22/2015  . VIDEO BRONCHOSCOPY  07/28/2012   Procedure: VIDEO BRONCHOSCOPY WITH FLUORO;  Surgeon: Chesley Mires, MD;  Location: [facility];  Service: Cardiopulmonary;  Laterality: Bilateral;  . VIDEO BRONCHOSCOPY Bilateral 10/17/2017   Procedure: VIDEO BRONCHOSCOPY WITH FLUORO;  Surgeon: Chesley Mires, MD;  Location: [facility];  Service: Cardiopulmonary;  Laterality: Bilateral;     reports that he quit smoking about 9 years ago. His smoking use included pipe. He smoked 0.00 packs per day for 30.00 years. He has never used smokeless tobacco. He reports that he does not drink alcohol or use drugs.  Allergies  Allergen Reactions  . Statins Other (See Comments)    Muscle aches - tolerating Vytorin    Family History  Problem Relation Age of Onset  . Heart disease Mother   . Heart disease Father   . Colon polyps Brother   . Congestive Heart Failure Brother   . Coronary artery disease Unknown   . Stomach cancer Paternal Grandmother   . Colon cancer Neg Hx   . Pancreatic cancer Neg Hx   . Rectal cancer Neg Hx   . Esophageal cancer Neg Hx     Prior to Admission medications   Medication Sig Start Date End Date Taking? Authorizing Provider  albuterol (PROVENTIL HFA;VENTOLIN HFA) 108 (90 Base) MCG/ACT inhaler 1-2 PUFFS EVERY 4-6  HOURS AS NEEDED FOR SHORTNESS OF BREATH 10/20/17  Yes Chesley Mires, MD  amiodarone (PACERONE) 100 MG tablet Take 1 tablet (100 mg total) by mouth daily. 10/22/17  Yes Kilroy, Doreene Burke, PA-C  apixaban (ELIQUIS) 5 MG TABS tablet Take 1 tablet (5 mg total) by mouth 2 (two) times daily. 10/25/17  Yes Patrecia Pour, Christean Grief, MD  atorvastatin (LIPITOR) 20 MG tablet Take 1 tablet (20 mg total) by mouth daily at 6 PM. 09/08/17  Yes Dessa Phi, DO  benzonatate (TESSALON) 200 MG capsule Take 1 capsule (200 mg total) by mouth 3 (three) times daily as needed for cough. 09/22/17  Yes Chesley Mires, MD  budesonide-formoterol (SYMBICORT) 160-4.5 MCG/ACT inhaler Inhale 2 puffs into the lungs 2 (two) times daily. 07/15/17  Yes Parrett, Tammy S, NP  cholecalciferol (VITAMIN D) 1000 units tablet Take 2,000 Units by mouth daily.   Yes [provider]  DULoxetine (CYMBALTA) 30 MG capsule Take 30 mg by mouth daily.   Yes [provider]  ENTRESTO 24-26 MG TAKE 1 TABLET BY MOUTH 2 (TWO) TIMES DAILY. 05/14/16  Yes Lorretta Harp, MD  finasteride (PROSCAR) 5 MG tablet Take 5 mg by mouth daily.  01/07/13  Yes [provider]  FLUoxetine (PROZAC) 40 MG capsule Take 40 mg by mouth daily with breakfast.    Yes [provider]  guaiFENesin (MUCINEX) 600 MG 12 hr tablet Take 1 tablet (600 mg total) by mouth 2 (two) times daily. 10/13/17 10/13/18 Yes Florencia Reasons, MD  HYDROcodone-acetaminophen (NORCO/VICODIN) 5-325 MG tablet Take 1 tablet by mouth every 8 (eight) hours as needed for moderate pain.    Yes [provider]  Loperamide HCl (IMODIUM A-D PO) Take 2 tablets by mouth daily as needed (diarrhea).   Yes [provider]  nystatin (MYCOSTATIN/NYSTOP) powder APPLY TO AFFECTED AREA 3 TIMES A DAY 10/30/17  Yes [provider]  OXYGEN Inhale 2 L/min into the lungs at bedtime.   Yes [provider]  Probiotic Product (PROBIOTIC PO) Take 1 tablet by mouth daily.   Yes [provider]  prochlorperazine (COMPAZINE) 10 MG tablet Take 1 tablet (10 mg total) by mouth every 6 (six) hours as needed for nausea or vomiting. 11/04/17  Yes Curt Bears, MD  sulfamethoxazole-trimethoprim (BACTRIM DS,SEPTRA DS) 800-160 MG tablet Take 1 tablet by mouth 2 (two) times daily. 11/18/17  Yes Alla Feeling, NP  furosemide (LASIX) 20 MG tablet Take 1 tablet (20 mg total) by mouth 2 (two) times daily. Patient not taking: Reported on 11/20/2017 09/08/17   Dessa Phi, DO  ipratropium (ATROVENT) 0.02 % nebulizer solution Take 2.5 mLs (0.5 mg total) by nebulization 4 (four) times daily. Patient not taking: Reported on 11/20/2017 07/15/17   Parrett, Fonnie Mu, NP  levalbuterol (XOPENEX) 1.25 MG/0.5ML nebulizer solution Take 1.25 mg by nebulization every 8 (eight) hours as needed for wheezing or shortness of breath. Patient not taking: Reported on 11/20/2017 10/13/17   Florencia Reasons, MD  metoprolol tartrate (LOPRESSOR) 25 MG tablet TAKE 1 TABLET (25 MG TOTAL) BY MOUTH 2 (TWO) TIMES DAILY. Patient not taking: Reported on 11/20/2017 09/11/15   Lorretta Harp, MD    Physical Exam: Vitals:   11/20/17 1854 11/20/17 1900 11/20/17 2053 11/20/17 2101  BP: 96/71  102/83   Pulse: 100  (!) 141   Resp: (!) 24  (!) 23   Temp:   97.9 F (36.6 C)   TempSrc:   Oral   SpO2: (!) 88% 91% 93% 92%      Constitutional: Moderately built and nourished. Vitals:   11/20/17 1854 11/20/17 1900 11/20/17 2053 11/20/17 2101  BP: 96/71  102/83   Pulse: 100  (!) 141   Resp: (!) 24  (!) 23   Temp:   97.9 F (36.6 C)   TempSrc:   Oral   SpO2: (!) 88% 91% 93% 92%   Eyes: Anicteric no pallor. ENMT: No discharge from the ears eyes nose or mouth. Neck: No mass palpated no JVD appreciated. Respiratory: Mild expiratory wheeze more than the right side.  No crepitations. Cardiovascular: S1-S2 heard no murmurs appreciated. Abdomen: Soft nontender bowel sounds present. Musculoskeletal: Mild edema both lower  extremity. Skin: No rash. Neurologic: Alert awake oriented to time place and person.  Moves all extremities 5 x 5. Psychiatric: Appears normal.  Normal affect.   Labs on Admission: I have personally reviewed following labs and imaging studies  CBC: Recent Labs  Lab 11/18/17 0934 11/20/17 1612  WBC 18.2* 34.0*  NEUTROABS 14.6* 30.3*  HGB 11.0* 11.9*  HCT 32.8* 35.6*  MCV 79.8 81.3  PLT 370 423   Basic Metabolic Panel: Recent Labs  Lab 11/18/17 0934 11/20/17 1612  NA 133* 138  K 3.9 3.5  CL 99 101  CO2 21* 24  GLUCOSE 132 161*  BUN 23 22*  CREATININE 1.33* 1.78*  CALCIUM 9.1 8.7*   GFR: Estimated Creatinine Clearance: 43.8 mL/min (A) (by C-G formula based on SCr of 1.78 mg/dL (H)). Liver Function Tests: Recent Labs  Lab 11/18/17 0934 11/20/17 1612  AST 18 20  ALT 15 15*  ALKPHOS 86 100  BILITOT 0.5 0.4  PROT 6.0* 6.3*  ALBUMIN 2.6* 2.9*   No results for input(s): LIPASE, AMYLASE in the last 168 hours. No results for input(s): AMMONIA in the last 168 hours. Coagulation Profile: No results for input(s): INR, PROTIME in the last 168 hours. Cardiac Enzymes: No results for input(s): CKTOTAL, CKMB, CKMBINDEX, TROPONINI in the last 168 hours. BNP (last 3 results) Recent Labs    08/08/17 0958  PROBNP 92.0   HbA1C: No results for input(s): HGBA1C in the last 72 hours. CBG: No results for input(s): GLUCAP in the last 168 hours. Lipid Profile: No results for input(s): CHOL, HDL, LDLCALC, TRIG, CHOLHDL, LDLDIRECT in the last 72 hours. Thyroid Function Tests: No results for input(s): TSH, T4TOTAL, FREET4, T3FREE, THYROIDAB in the last 72 hours. Anemia Panel: No results for input(s): VITAMINB12, FOLATE, FERRITIN, TIBC, IRON, RETICCTPCT in the last 72 hours. Urine analysis:    Component Value Date/Time   COLORURINE AMBER (A) 11/18/2017 1159   APPEARANCEUR HAZY (A) 11/18/2017 1159   LABSPEC 1.025 11/18/2017 1159   PHURINE 5.0 11/18/2017 1159   GLUCOSEU  NEGATIVE 11/18/2017 1159   HGBUR NEGATIVE 11/18/2017 1159   BILIRUBINUR SMALL (A) 11/18/2017 1159   KETONESUR 5 (A) 11/18/2017 1159   PROTEINUR 30 (A) 11/18/2017 1159   UROBILINOGEN 0.2 08/19/2014 1909   NITRITE NEGATIVE 11/18/2017 1159   LEUKOCYTESUR TRACE (A) 11/18/2017 1159   Sepsis Labs: @LABRCNTIP (procalcitonin:4,lacticidven:4) ) Recent Results (from the past 240 hour(s))  Urine Culture     Status: None   Collection Time: 11/18/17 11:59 AM  Result Value Ref Range Status   Specimen Description   Final    URINE, CLEAN CATCH Performed at [facility], [place] [address] [place], [place] [address]    Special Requests   Final    NONE Performed at [facility], [place] [address] [place], [place] [address]    Culture   Final    NO GROWTH Performed at [facility], [place] [address], [place], [place] [address]    Report Status 11/19/2017 FINAL  Final     Radiological Exams on Admission: Ct Angio Chest Pe W And/or Wo Contrast  Result Date: 11/20/2017 CLINICAL DATA:  Brought from cancer center post radiation treatment, Pt presents with dyspnea, diaphoretic, hypertensive and reported hypoxia, sp02 low 80s, placed on 6lts at center. Sp02 on arrival 99 on 2ltr nasal canula. History of stage IV lung cancer. EXAM: CT ANGIOGRAPHY CHEST WITH CONTRAST TECHNIQUE: Multidetector CT imaging of the chest was performed using the standard protocol during bolus administration of intravenous contrast. Multiplanar CT image reconstructions and MIPs were obtained to evaluate the vascular anatomy. CONTRAST:  100mL ISOVUE-370 IOPAMIDOL (ISOVUE-370) INJECTION 76% COMPARISON:  11/11/2017, 10/23/2017 FINDINGS: Cardiovascular: There is significant artifact from numerous calcified mediastinal and hilar lymph nodes. The pulmonary arteries are well opacified. There are  filling defects within small RIGHT LOWER lobe pulmonary arteries consistent with pulmonary embolus.  There is marked narrowing of the LEFT pulmonary artery secondary to mediastinal and hilar mass. There is obstruction of the LEFT pulmonary veins, stable in appearance. Coronary artery calcifications are present. Mediastinum/Nodes: Stable, large soft tissue mass involving the LEFT hilum and mediastinum. Lungs/Pleura: There is near complete obstruction of the LEFT mainstem bronchus. There is significant effacement of the RIGHT mainstem bronchus. There is near complete opacification of the LEFT lung with significantly less aerated lung in the UPPER lobe compared to prior studies. Findings are consistent with a combination of tumor and atelectasis. There are small amounts of pleural air anteriorly on the LEFT following thoracentesis earlier today. Moderate pleural effusion persists. Upper Abdomen: Cholecystectomy. Musculoskeletal: Degenerative changes in the thoracic spine. No suspicious lytic or blastic lesions. Review of the MIP images confirms the above findings. IMPRESSION: 1. Suspect small pulmonary emboli within the RIGHT LOWER lobe pulmonary artery branches. 2. Near complete obstruction of the LEFT mainstem bronchus. This results in near complete atelectasis of the LEFT UPPER and LOWER lobes. 3. Persistent moderate LEFT pleural effusion. 4. Extrinsic compression and resulting effacement of the LEFT pulmonary arteries. 5. Large mediastinal and LEFT hilar mass, stable in appearance. These results were called by telephone at the time of interpretation on 11/20/2017 at 6:43 pm to Dr. Shirlyn Goltz , who verbally acknowledged these results. Electronically Signed   By: Nolon Nations M.D.   On: 11/20/2017 18:46   Dg Chest Port 1 View  Result Date: 11/20/2017 CLINICAL DATA:  Post thoracentesis. EXAM: PORTABLE CHEST 1 VIEW COMPARISON:  Chest x-ray Nov 20, 2017 FINDINGS: Complete opacification left hemithorax remains. No definitive pneumothorax. The right lung remains clear. No other changes. IMPRESSION: Continued  complete opacification of the left hemithorax without convincing evidence of pneumothorax. Electronically Signed   By: Dorise Bullion III M.D   On: 11/20/2017 18:45   Dg Chest Port 1 View  Result Date: 11/20/2017 CLINICAL DATA:  History of lung carcinoma EXAM: PORTABLE CHEST 1 VIEW COMPARISON:  PET-CT of 11/11/2017 and chest x-ray of 10/24/2017 FINDINGS: There is now near complete opacification of the left hemithorax with slight mediastinal shift to the right. This likely suggests a large right pleural effusion. Heart size is difficult to assess. The right lung is clear. Reverse left shoulder arthroplasty is noted. IMPRESSION: 1. Near complete opacification of the left hemithorax with some mediastinal shift to the right indicating a large left pleural effusion. 2. The right lung is grossly clear. Electronically Signed   By: Ivar Drape M.D.   On: 11/20/2017 17:04    EKG: Independently reviewed.  A. fib with RVR.  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 Acute respiratory failure with hypoxia likely a combination of CHF probably precipitated by A. fib with RVR and pleural effusion likely malignant and also COPD with possible postobstructive pneumonia with near total occlusion of the left main bronchus.  I have discussed with pulmonary critical care Dr. Halford Chessman, who knows the patient well and will be following as  consult.  For now patient is placed on amiodarone infusion for the A. fib with RVR with low normal blood pressure.  Continue with nebulizer treatment for which I have placed  patient on Xopenex and Pulmicort and also empiric antibiotics for possible pneumonia.  Follow intake output metabolic panel cultures troponin and cardiology also will be seeing patient in consult. 2. COPD see #1. 3. A. fib with RVR see 1. 4. Small pulmonary embolism on heparin at this time.  Follow cardiac markers.  See #1. 5. Chronic combined CHF last EF measured was 50 to 55% with grade 1 diastolic dysfunction during this month.  See #1.  Holding Lasix Entresto and metoprolol due to low normal blood pressure. 6. Low back pain -denies any weakness of the lower extremity or any incontinence of urine or bowel.  Patient eventually will need MRI L-spine once more stable. 7. OSA on CPAP. 8. Non-small cell lung cancer being followed by Dr. Julien Nordmann.   DVT prophylaxis: Heparin. Code Status: DO NOT INTUBATE. Family Communication: Family at the bedside. Disposition Plan: Home. Consults called: Pulmonary critical care and cardiology. Admission status: Inpatient.   Rise Patience MD [facility] Pager [phone].  If 7PM-7AM, please contact night-coverage [email or web address] Password [facility]  11/20/2017, 10:46 PM

## 2017-11-20 NOTE — Progress Notes (Signed)
ANTICOAGULATION CONSULT NOTE - Initial Consult  Pharmacy Consult for IV heparin - note that patient was on apixaban prior to admission Indication: pulmonary embolus  Allergies  Allergen Reactions  . Statins Other (See Comments)    Muscle aches - tolerating Vytorin    Patient Measurements:   Heparin Dosing Weight: 99 kg  Vital Signs: Temp: 97.9 F (36.6 C) (05/30 1605) BP: 87/67 (05/30 1745) Pulse Rate: 41 (05/30 1745)  Labs: Recent Labs    11/18/17 0934 11/20/17 1612  HGB 11.0* 11.9*  HCT 32.8* 35.6*  PLT 370 371  CREATININE 1.33* 1.78*    Estimated Creatinine Clearance: 43.8 mL/min (A) (by C-G formula based on SCr of 1.78 mg/dL (H)).   Medical History: Past Medical History:  Diagnosis Date  . Acute on chronic combined systolic and diastolic CHF (congestive heart failure) ([place]) 02/24/2015  . Anxiety   . Arthritis    "about q joint I've got" (09/22/2015)  . Atherosclerosis   . CAP (community acquired pneumonia) 02/24/2015  . Cervical disc disease   . Chronic lower back pain    chronic back pain/under pain management  . Colon polyps 08/02/2014   Tubular adenoma x 3, Hyperplastic-1  . COPD (chronic obstructive pulmonary disease) ([place])    a. Former Airline pilot, also smoked a pipe.  . Coronary artery calcification seen on CAT scan   . Depression   . Dyslipidemia   . Dyspnea    chronic  . GERD (gastroesophageal reflux disease)   . Hiatal hernia    sensation problem ("right lateral side hip to knee").  . History of blood transfusion "numerous"  . History of oxygen administration    @ 2 l/m nasally at bedtime.(09/22/2015)  . Hypertension   . Inguinal hernia    right side  . Migraine    "years since I've had one" (09/22/2015)  . OSA (obstructive sleep apnea)    does not use CPAP; "just oxygen" (09/22/2015)  . PTSD (post-traumatic stress disorder)   . Rhinitis   . Sarcoidosis   . Sensation problem    right side-lateral hip to knee" decreased sensation and  tingling feeling" -has informed Dr. Joylene Draft, Dr. Henrene Pastor, Dr. Lucia Gaskins of this.  . Sepsis ([place])   . Steroid-induced hyperglycemia 02/25/2015  . Subdural hematoma ([place]) 11/10   a. 2010 s/p surgery - diagnosed several weeks after a fall.    Medications:  Scheduled:  . albuterol  15 mg/hr Nebulization Once  . diltiazem  10 mg Intravenous Once  . iopamidol      . ipratropium  0.5 mg Nebulization Once  . lidocaine       Infusions:  . ceFEPime (MAXIPIME) IV    . vancomycin      Assessment: 77 yo male with lung cancer current receiving chemotherapy presented to ER with increased SOB found to have a PE to start IV heparin per pharmacy. Note that patient was taking apixaban 5mg  PO BID prior to admission with last dose being this AM 5/30 at 1100 for Afib. Due to bleeding risk, will be unable to start IV heparin until when next apixaban dose would have been due which would be around 2300 tonight. Baseline CBC and renal function has been obtained, will order baseline aPTT and heparin level prior to start of IV heparin. Heparin level expected to be falsely elevated due to apixaban so will likely need to check heparin level and aPTT until the 2 levels appropriately correlate then can change to only checking heparin levels  Goal  of Therapy:  Heparin level 0.3-0.7 units/ml aPTT 66-102 seconds Monitor platelets by anticoagulation protocol: Yes   Plan:  1) At 2300, start IV heparin at rate of 1600 units/hr 2) NO IV heparin bolus 3) Check heparin level and aPTT 8 hours after start of IV heparin 4) Daily CBC, HL, and aPTT   Adrian Saran, PharmD, BCPS Pager [phone] 11/20/2017 6:55 PM

## 2017-11-20 NOTE — Progress Notes (Signed)
The patient presented as a walk-in today stating that he was having increased shortness of breath.  Additionally the patient was having significant right hip pain after sitting in a wheelchair for approximately 1 hour.  He had been seen prior to his presentation here in the cancer clinic and radiation therapy for a treatment today.  His vital signs were taken in the waiting area where it was determined that his blood pressure was 164/31, pulse approximately 160 and oxygen saturation at 99% on room air.  The patient was diaphoretic and clammy.  Dr. Earlie Server was made aware of this, the emergency room charge nurse was contacted and the patient was transported to the emergency room for evaluation and management.  Sandi Mealy, MHS, PA-C

## 2017-11-20 NOTE — Progress Notes (Signed)
BRIEF ANTICOAGULATION CONSULT NOTE  See full note earlier today from [place] for full details. Pharmacy Consult for IV heparin Indication: pulmonary embolus  Heparin Dosing Weight: 99 kg   Assessment: 77 yo male with lung cancer current receiving chemotherapy presented to ER with increased SOB found to have a PE to start IV heparin per pharmacy. Note that patient was taking apixaban 5mg  PO BID prior to admission with last dose being this AM 5/30 at 1100 for Afib.   18:00 Thoracetesis with 522mL removed. 20:00 Cardiology note:  "hold eliquis and heparin drip in case invasive procedures indicated"  I discussed with Dr. Darl Householder in ED and he does want to continue the heparin gtt, but NO BOLUS.  OK to turn off in a few hours if needed for procedures, thoracentesis performed already.    Baseline APTT 33 and baseline HL > 2.2  Goal of Therapy:  Heparin level 0.3-0.7 units/ml aPTT 66-102 seconds Monitor platelets by anticoagulation protocol: Yes   Plan:  1) At 2300, start IV heparin at rate of 1600 units/hr 2) NO IV heparin bolus 3) Check heparin level and aPTT 8 hours after start of IV heparin 4) Daily CBC, HL, and aPTT   [name] PharmD, BCPS Pager [phone] 11/20/2017 9:28 PM

## 2017-11-20 NOTE — ED Triage Notes (Signed)
Brought from cancer center post radiation treatment, Pt presents with dyspnea, diaphoretic, hypertensive and reported hypoxia, sp02 low 80s, placed on 6lts at center. Sp02 on arrival 99 on 2ltr nasal canula. Hx stage 4 lung cancer. Pt c/o back pain

## 2017-11-20 NOTE — Progress Notes (Signed)
Pharmacy Antibiotic Note  Carlin Attridge. is a 77 y.o. male admitted on 11/20/2017 with pneumonia.  Pharmacy has been consulted for zosyn and vancomcyin dosing.  Plan: Zosyn 3.375g IV q8h (4 hour infusion).  Vancomycin 1 gm x1 then 1500 mg IV q48h for est AUC = 463 Goal AUC = 400-500 Daily scr F/u cultures/levels     Temp (24hrs), Avg:97.9 F (36.6 C), Min:97.9 F (36.6 C), Max:97.9 F (36.6 C)  Recent Labs  Lab 11/18/17 0934 11/20/17 1612 11/20/17 1656 11/20/17 1934  WBC 18.2* 34.0*  --   --   CREATININE 1.33* 1.78*  --   --   LATICACIDVEN  --   --  2.61* 1.14    Estimated Creatinine Clearance: 43.8 mL/min (A) (by C-G formula based on SCr of 1.78 mg/dL (H)).    Allergies  Allergen Reactions  . Statins Other (See Comments)    Muscle aches - tolerating Vytorin    Antimicrobials this admission: 5/30 cefepime >> x1 ED 5/31  zosyn >>  5/30 vancomycin >>  Dose adjustments this admission:   Microbiology results:  BCx:   UCx:    Sputum:    MRSA PCR:   Thank you for allowing pharmacy to be a part of this patient's care.  Dorrene German 11/20/2017 11:05 PM

## 2017-11-20 NOTE — ED Notes (Signed)
I Stat Lactic 2.61 RN and provider notified

## 2017-11-20 NOTE — Telephone Encounter (Signed)
Instructed wife to do walk in form.

## 2017-11-20 NOTE — ED Notes (Signed)
Bed: WA09 Expected date:  Expected time:  Means of arrival:  Comments: [place]

## 2017-11-21 ENCOUNTER — Encounter (HOSPITAL_COMMUNITY): Payer: Self-pay | Admitting: Physician Assistant

## 2017-11-21 ENCOUNTER — Inpatient Hospital Stay (HOSPITAL_COMMUNITY): Payer: Medicare Other

## 2017-11-21 ENCOUNTER — Other Ambulatory Visit: Payer: Self-pay

## 2017-11-21 ENCOUNTER — Ambulatory Visit: Payer: Medicare Other

## 2017-11-21 DIAGNOSIS — I5031 Acute diastolic (congestive) heart failure: Secondary | ICD-10-CM

## 2017-11-21 DIAGNOSIS — I10 Essential (primary) hypertension: Secondary | ICD-10-CM

## 2017-11-21 DIAGNOSIS — J9601 Acute respiratory failure with hypoxia: Secondary | ICD-10-CM

## 2017-11-21 DIAGNOSIS — J90 Pleural effusion, not elsewhere classified: Secondary | ICD-10-CM

## 2017-11-21 DIAGNOSIS — I2699 Other pulmonary embolism without acute cor pulmonale: Secondary | ICD-10-CM

## 2017-11-21 DIAGNOSIS — I4891 Unspecified atrial fibrillation: Secondary | ICD-10-CM

## 2017-11-21 LAB — APTT
aPTT: 134 seconds — ABNORMAL HIGH (ref 24–36)
aPTT: 71 seconds — ABNORMAL HIGH (ref 24–36)

## 2017-11-21 LAB — CBC
HEMATOCRIT: 32.7 % — AB (ref 39.0–52.0)
HEMOGLOBIN: 10.6 g/dL — AB (ref 13.0–17.0)
MCH: 26.5 pg (ref 26.0–34.0)
MCHC: 32.4 g/dL (ref 30.0–36.0)
MCV: 81.8 fL (ref 78.0–100.0)
Platelets: 298 10*3/uL (ref 150–400)
RBC: 4 MIL/uL — ABNORMAL LOW (ref 4.22–5.81)
RDW: 15.3 % (ref 11.5–15.5)
WBC: 29 10*3/uL — ABNORMAL HIGH (ref 4.0–10.5)

## 2017-11-21 LAB — TSH: TSH: 1.396 u[IU]/mL (ref 0.350–4.500)

## 2017-11-21 LAB — BASIC METABOLIC PANEL
Anion gap: 9 (ref 5–15)
BUN: 23 mg/dL — AB (ref 6–20)
CHLORIDE: 103 mmol/L (ref 101–111)
CO2: 24 mmol/L (ref 22–32)
CREATININE: 1.5 mg/dL — AB (ref 0.61–1.24)
Calcium: 8.1 mg/dL — ABNORMAL LOW (ref 8.9–10.3)
GFR calc Af Amer: 50 mL/min — ABNORMAL LOW (ref >60)
GFR calc non Af Amer: 43 mL/min — ABNORMAL LOW (ref >60)
GLUCOSE: 164 mg/dL — AB (ref 65–99)
Potassium: 4.3 mmol/L (ref 3.5–5.1)
Sodium: 136 mmol/L (ref 135–145)

## 2017-11-21 LAB — TROPONIN I
TROPONIN I: 0.03 ng/mL — AB (ref <0.03)
Troponin I: <0.03 ng/mL (ref <0.03)
Troponin I: 0.03 ng/mL (ref <0.03)

## 2017-11-21 LAB — HEPATIC FUNCTION PANEL
ALT: 14 U/L — ABNORMAL LOW (ref 17–63)
AST: 17 U/L (ref 15–41)
Albumin: 2.6 g/dL — ABNORMAL LOW (ref 3.5–5.0)
Alkaline Phosphatase: 86 U/L (ref 38–126)
Bilirubin, Direct: 0.1 mg/dL (ref 0.1–0.5)
Indirect Bilirubin: 0.5 mg/dL (ref 0.3–0.9)
Total Bilirubin: 0.6 mg/dL (ref 0.3–1.2)
Total Protein: 5.5 g/dL — ABNORMAL LOW (ref 6.5–8.1)

## 2017-11-21 LAB — MAGNESIUM: Magnesium: 1.3 mg/dL — ABNORMAL LOW (ref 1.7–2.4)

## 2017-11-21 LAB — MRSA PCR SCREENING: MRSA by PCR: NEGATIVE

## 2017-11-21 LAB — PROCALCITONIN: Procalcitonin: 0.46 ng/mL

## 2017-11-21 LAB — HEPARIN LEVEL (UNFRACTIONATED): Heparin Unfractionated: >2.2 IU/mL — ABNORMAL HIGH (ref 0.30–0.70)

## 2017-11-21 MED ORDER — ARFORMOTEROL TARTRATE 15 MCG/2ML IN NEBU
15.0000 ug | INHALATION_SOLUTION | Freq: Two times a day (BID) | RESPIRATORY_TRACT | Status: DC
  Administered 2017-11-21 – 2017-12-01 (×20): 15 ug via RESPIRATORY_TRACT
  Filled 2017-11-21 (×19): qty 2

## 2017-11-21 MED ORDER — METHYLPREDNISOLONE SODIUM SUCC 125 MG IJ SOLR
60.0000 mg | Freq: Four times a day (QID) | INTRAMUSCULAR | Status: DC
  Administered 2017-11-21 – 2017-11-23 (×8): 60 mg via INTRAVENOUS
  Filled 2017-11-21 (×8): qty 2

## 2017-11-21 MED ORDER — FUROSEMIDE 10 MG/ML IJ SOLN
40.0000 mg | Freq: Two times a day (BID) | INTRAMUSCULAR | Status: DC
  Administered 2017-11-21 – 2017-11-22 (×3): 40 mg via INTRAVENOUS
  Filled 2017-11-21 (×3): qty 4

## 2017-11-21 MED ORDER — DILTIAZEM HCL-DEXTROSE 100-5 MG/100ML-% IV SOLN (PREMIX)
5.0000 mg/h | INTRAVENOUS | Status: DC
  Administered 2017-11-21: 10 mg/h via INTRAVENOUS
  Administered 2017-11-21: 5 mg/h via INTRAVENOUS
  Administered 2017-11-22: 10 mg/h via INTRAVENOUS
  Filled 2017-11-21 (×3): qty 100

## 2017-11-21 MED ORDER — VANCOMYCIN HCL 10 G IV SOLR
1250.0000 mg | INTRAVENOUS | Status: DC
  Administered 2017-11-22: 1250 mg via INTRAVENOUS
  Filled 2017-11-21: qty 1250

## 2017-11-21 MED ORDER — BENZONATATE 100 MG PO CAPS
200.0000 mg | ORAL_CAPSULE | Freq: Three times a day (TID) | ORAL | Status: DC | PRN
  Administered 2017-11-21 – 2017-11-24 (×9): 200 mg via ORAL
  Filled 2017-11-21 (×9): qty 2

## 2017-11-21 MED ORDER — HEPARIN (PORCINE) IN NACL 100-0.45 UNIT/ML-% IJ SOLN
1300.0000 [IU]/h | INTRAMUSCULAR | Status: DC
  Administered 2017-11-21 (×2): 1300 [IU]/h via INTRAVENOUS
  Filled 2017-11-21: qty 250

## 2017-11-21 MED ORDER — SODIUM CHLORIDE 0.9 % IV SOLN
INTRAVENOUS | Status: DC
  Administered 2017-11-21 (×2): via INTRAVENOUS

## 2017-11-21 MED ORDER — MAGNESIUM SULFATE 2 GM/50ML IV SOLN
2.0000 g | Freq: Once | INTRAVENOUS | Status: AC
  Administered 2017-11-21: 2 g via INTRAVENOUS
  Filled 2017-11-21: qty 50

## 2017-11-21 NOTE — Progress Notes (Signed)
Patient ID: Travis Ferguson., male   DOB: [birth year], 77 y.o.   MRN: [id number] Request received for left Pleurx catheter placement in patient.  He is familiar to IR service from prior cholecystostomy in 2016 along with drainage of a pericholecystic fluid collection.  He has undergone 2 thoracenteses by IR, latest on 5/3 yielding 1.3 liters..  He underwent thoracentesis in ED yesterday.  Imaging studies have been reviewed by Dr. Vernard Gambles.  Procedure briefly discussed with patient and son.  Patient states that he would like to wait several days to see how he does with medical treatment before committing to Pleurx catheter.  We will continue to monitor and schedule accordingly once patient is ready to proceed.  He will need to remain off IV heparin for 2 to 4 hours preprocedure and have at least a moderate effusion by [place] in order to proceed.

## 2017-11-21 NOTE — Consult Note (Signed)
Attending:    Subjective: Travis Ferguson developed the abrupt onset of worsening dyspnea 5 days ago.  He has a history of lung cancer, COPD, sarcoidosis, and OSA and is followed by my partner Dr. Halford Chessman.  He came to the ER yesterday and was found to have a PE in the right lung and had a thoracentesis.  This is his third or fourth thoracentesis for a recurrent left pleural effusion.  He says it helped a little but he remains short of breath.    Objective: Vitals:   11/21/17 1000 11/21/17 1100 11/21/17 1200 11/21/17 1213  BP: (!) 162/78 (!) 134/92 125/80   Pulse: (!) 153 (!) 133 (!) 141 (!) 140  Resp: 20 (!) 23 (!) 29 (!) 29  Temp:    97.9 F (36.6 C)  TempSrc:    Axillary  SpO2: 93% 96% 94% 97%  Weight:      Height:       FiO2 (%):  [30 %] 30 %  Intake/Output Summary (Last 24 hours) at 11/21/2017 1240 Last data filed at 11/21/2017 0900 Gross per 24 hour  Intake 911.19 ml  Output 600 ml  Net 311.19 ml    Gen: obese, chronically ill appearing HENT: OP clear, TM's clear, neck supple PULM: Wheezing on R, no air movement on left B, normal percussion CV: Tachy, irreg irreg, no mgr, trace edema GI: BS+, soft, nontender Derm: no cyanosis or rash Psyche: normal mood and affect    CBC    Component Value Date/Time   WBC 29.0 (H) 11/21/2017 0727   RBC 4.00 (L) 11/21/2017 0727   HGB 10.6 (L) 11/21/2017 0727   HGB 11.0 (L) 11/18/2017 0934   HCT 32.7 (L) 11/21/2017 0727   PLT 298 11/21/2017 0727   PLT 370 11/18/2017 0934   MCV 81.8 11/21/2017 0727   MCH 26.5 11/21/2017 0727   MCHC 32.4 11/21/2017 0727   RDW 15.3 11/21/2017 0727   LYMPHSABS 1.0 11/20/2017 1612   MONOABS 2.4 (H) 11/20/2017 1612   EOSABS 0.3 11/20/2017 1612   BASOSABS 0.0 11/20/2017 1612    BMET    Component Value Date/Time   NA 136 11/21/2017 0727   K 4.3 11/21/2017 0727   CL 103 11/21/2017 0727   CO2 24 11/21/2017 0727   GLUCOSE 164 (H) 11/21/2017 0727   BUN 23 (H) 11/21/2017 0727   CREATININE 1.50 (H)  11/21/2017 0727   CREATININE 1.33 (H) 11/18/2017 0934   CREATININE 1.00 05/29/2015 1516   CALCIUM 8.1 (L) 11/21/2017 0727   GFRNONAA 43 (L) 11/21/2017 0727   GFRNONAA 50 (L) 11/18/2017 0934   GFRAA 50 (L) 11/21/2017 0727   GFRAA 58 (L) 11/18/2017 0934    CXR images reviewed, dense white out left lung, right lung clear CT angiogram chest images reviewed showing the left effusion and RUL pulmonary embolism  Impression/Plan: Trapped lung on right with persistent effusion> needs pleurex Pulmonary embolism: continue heparin, though with his severe dyspnea I worry he may need thrombolysis because his only good lung (the right) has a fairly sizeable pulmonary embolism in it.  We'll try BIPAP to see if this settles down his work of breathing and monitor closely in the ICU. COPD with acute exacerbation: continue solumedrol, bronchodilators as ordered, add BIPAP Lung cancer: per primary/oncology afib with RVR> continue amiodarone, f/u cardiology recs  Met with and discussed situation with his wife Travis Ferguson.  My cc time 35 minutes  Roselie Awkward, MD [facility] Pager: [phone] Cell: [phone] After 3pm or  if no response, call [phone]

## 2017-11-21 NOTE — Consult Note (Addendum)
Cardiology Consultation:   Patient ID: Travis Ferguson.; [id number]; Nov 19, 1940   Admit date: 11/20/2017 Date of Consult: 11/21/2017  Primary Care Provider: Crist Infante, MD Primary Cardiologist: Dr. Gwenlyn Found  Chief Complaint: shortness of breath  Patient Profile:   Travis Ferguson. is a 77 y.o. 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 Hal Hope.  History of Present Illness:   He has a history of coronary calcification on CT scan and NICM. Due to prior LV dysfunction on an echo with EF 20-25%, he underwent [facility] 2016 revealing minimal CAD, elevated filling pressures and LV dysfunction. He declined ICD. In 08/2017 he was admitted with respiratory failure and atrial flutter. He was found to have a left lower lobe pneumonia and was treated with BiPAP, bronchodilators, steroids and IV antibiotics. He was also placed on IV amiodarone which was converted to by mouth. He was anticoagulated during his hospitalization however this was stopped at discharge due to h/o SDH. Echo actually showed improvement in LV function with EF 50-55%, grade 1 DD, mild pulm HTN. He was admitted 09/2017 with recurrent dyspnea and fatigue and was reported to have paroxysm of PAF on telemetry while in the hospital with a left pleural effusion requiring thoracentesis. He was placed on Eliquis and continued on amiodarone but patient stopped Eliquis on his own once he found out he had lung cancer. At OP f/u 10/22/17 because he was in NSR, amiodarone was decreased to 100mg  and Eliquis was resumed.  His lung CA was  recently diagnosed, stage 4. He recently started chemotherapy with Keytruda, Aloxi, Carboplatin, Paclitaxel, and Emend with decadron and began palliative radiation. He was also found to have recurrent pleural effusion and repeat thoracentesis was planned. At visit 11/18/17 he had low blood pressure (83/57) and was tachycardic (110) felt due to dehydration so he was given IV fluids and Lasix and metoprolol were held. Exam listed RRR at that visit. He presented to oncology office yesterday with increased dyspnea and was found to be hypertensive, tachycardic at 160bpm -> EKG suspicious for atrial flutter. CT angio showed suspected small PE in the RLL with near complete obstruction of the left mainstem bronchus, persistent moderate left pleural effusion, extrinsic compression and resulting effacement of the LEFT pulmonary arteries, large mediastinal and left hilar mass stable in appearance. Labs show marked hypomagnesemia 1.3, AKI with BUN/Cr 22/1.78, albumin 3.6, BNP 445, marked leukocytosis of 34K, and mild stable anemia. Recent TSH wnl. He is being treated with magnesium, antibiotics, heparin, IV amiodarone. Dr. Harl Bowie reviewed chart and recommended IV amiodarone load and drip, hold eliquis and start heparin drip in case invasive procedures were indicated. He remains tachycardic in the 130s-140s with atrial fib on telemetry. He remains short of breath and wheezy. No specific chest pain reported. No LEE.   Past Medical History:  Diagnosis Date  . Anxiety   . Arthritis    "about q joint I've got" (09/22/2015)  . Atherosclerosis   . CAP (community acquired pneumonia) 02/24/2015  . Cervical disc disease   . Chronic combined systolic and diastolic CHF (congestive heart failure) (HCC)    a. prior EF 20-25% in  2016 (NICM), improved to 50-55% in 08/2017.  [name] Chronic lower back pain    chronic back pain/under pain management  . Colon polyps 08/02/2014   Tubular adenoma x 3, Hyperplastic-1  . COPD (chronic  obstructive pulmonary disease) ([place])    a. Former Airline pilot, also smoked a pipe.  . Depression   . Dyslipidemia   . Dyspnea    chronic  . GERD (gastroesophageal reflux disease)   . Hiatal hernia    sensation problem ("right lateral side hip to knee").  . History of blood transfusion "numerous"  . History of oxygen administration    @ 2 l/m nasally at bedtime.(09/22/2015)  . Hypertension   . Inguinal hernia    right side  . Migraine    "years since I've had one" (09/22/2015)  . Mild CAD    a. nonobstructive CAD by cath 2016.  [name] OSA (obstructive sleep apnea)    does not use CPAP; "just oxygen" (09/22/2015)  . PAF (paroxysmal atrial fibrillation) ([place])   . Paroxysmal atrial flutter ([place])   . PTSD (post-traumatic stress disorder)   . Recurrent pleural effusion on left   . Rhinitis   . Sarcoidosis   . Sensation problem    right side-lateral hip to knee" decreased sensation and tingling feeling" -has informed Dr. Joylene Draft, Dr. Henrene Pastor, Dr. Lucia Gaskins of this.  . Sepsis ([place])   . Squamous cell lung cancer ([place])   . Steroid-induced hyperglycemia 02/25/2015  . Subdural hematoma ([place]) 11/10   a. 2010 s/p surgery - diagnosed several weeks after a fall.    Past Surgical History:  Procedure Laterality Date  . BACK SURGERY    . BRAIN SURGERY  04/2009   right frontal"hemorrhage evacuation from fall injury"  . CARDIAC CATHETERIZATION N/A 01/23/2015   Procedure: Right/Left Heart Cath and Coronary Angiography;  Surgeon: Lorretta Harp, MD;  Location: [facility];  Service: Cardiovascular;  Laterality: N/A;  . CHOLECYSTECTOMY N/A 11/08/2014   Procedure: LAPAROSCOPIC CHOLECYSTECTOMY ;  Surgeon: Alphonsa Overall, MD;  Location: [facility];  Service: General;  Laterality: N/A;  . ERCP N/A 11/07/2014   Procedure: ENDOSCOPIC RETROGRADE CHOLANGIOPANCREATOGRAPHY (ERCP);  Surgeon: Irene Shipper, MD;  Location: [facility];  Service: Endoscopy;  Laterality: N/A;  . GALLBLADDER SURGERY  09/2014   drain tube  placed  . INGUINAL HERNIA REPAIR Left   . KNEE ARTHROSCOPY Right   . LUMBAR LAMINECTOMY/DECOMPRESSION MICRODISCECTOMY Right 12/15/2012   Procedure: LUMBAR LAMINECTOMY/DECOMPRESSION MICRODISCECTOMY 1 LEVEL;  Surgeon: Elaina Hoops, MD;  Location: [facility];  Service: Neurosurgery;  Laterality: Right;  Right Lumbar four-five laminectomy/foraminotomy  . REVERSE SHOULDER ARTHROPLASTY Left 09/22/2015   Procedure: LEFT REVERSE TOTAL SHOULDER ARTHROPLASTY;  Surgeon: Netta Cedars, MD;  Location: [facility];  Service: Orthopedics;  Laterality: Left;  . REVERSE TOTAL SHOULDER ARTHROPLASTY Left 09/22/2015  . VIDEO BRONCHOSCOPY  07/28/2012   Procedure: VIDEO BRONCHOSCOPY WITH FLUORO;  Surgeon: Chesley Mires, MD;  Location: [facility];  Service: Cardiopulmonary;  Laterality: Bilateral;  . VIDEO BRONCHOSCOPY Bilateral 10/17/2017   Procedure: VIDEO BRONCHOSCOPY WITH FLUORO;  Surgeon: Chesley Mires, MD;  Location: [facility];  Service: Cardiopulmonary;  Laterality: Bilateral;     Inpatient Medications: Scheduled Meds: . amiodarone  100 mg Oral Daily  . atorvastatin  20 mg Oral q1800  . budesonide (PULMICORT) nebulizer solution  0.25 mg Nebulization BID  . DULoxetine  30 mg Oral Daily  . FLUoxetine  40 mg Oral Q breakfast  . guaiFENesin  600 mg Oral BID  .  levalbuterol  0.63 mg Nebulization Q6H   Continuous Infusions: . amiodarone 30 mg/hr (11/21/17 0300)  . heparin 1,600 Units/hr (11/21/17 0300)  . magnesium sulfate 1 - 4 g bolus IVPB 2 g (11/21/17 0823)  . piperacillin-tazobactam (ZOSYN)  IV Stopped (11/21/17 0820)  . vancomycin Stopped (11/21/17 0735)   PRN Meds: acetaminophen **OR** acetaminophen, benzonatate, HYDROcodone-acetaminophen, levalbuterol, ondansetron **OR** ondansetron (ZOFRAN) IV  Home Meds: Prior to Admission medications   Medication Sig Start Date End Date Taking? Authorizing Provider  albuterol (PROVENTIL HFA;VENTOLIN HFA) 108 (90 Base) MCG/ACT inhaler 1-2 PUFFS EVERY 4-6 HOURS AS  NEEDED FOR SHORTNESS OF BREATH 10/20/17  Yes Chesley Mires, MD  amiodarone (PACERONE) 100 MG tablet Take 1 tablet (100 mg total) by mouth daily. 10/22/17  Yes Kilroy, Doreene Burke, PA-C  apixaban (ELIQUIS) 5 MG TABS tablet Take 1 tablet (5 mg total) by mouth 2 (two) times daily. 10/25/17  Yes Patrecia Pour, Christean Grief, MD  atorvastatin (LIPITOR) 20 MG tablet Take 1 tablet (20 mg total) by mouth daily at 6 PM. 09/08/17  Yes Dessa Phi, DO  benzonatate (TESSALON) 200 MG capsule Take 1 capsule (200 mg total) by mouth 3 (three) times daily as needed for cough. 09/22/17  Yes Chesley Mires, MD  budesonide-formoterol (SYMBICORT) 160-4.5 MCG/ACT inhaler Inhale 2 puffs into the lungs 2 (two) times daily. 07/15/17  Yes Parrett, Tammy S, NP  cholecalciferol (VITAMIN D) 1000 units tablet Take 2,000 Units by mouth daily.   Yes [provider]  DULoxetine (CYMBALTA) 30 MG capsule Take 30 mg by mouth daily.   Yes [provider]  ENTRESTO 24-26 MG TAKE 1 TABLET BY MOUTH 2 (TWO) TIMES DAILY. 05/14/16  Yes Lorretta Harp, MD  finasteride (PROSCAR) 5 MG tablet Take 5 mg by mouth daily.  01/07/13  Yes [provider]  FLUoxetine (PROZAC) 40 MG capsule Take 40 mg by mouth daily with breakfast.    Yes [provider]  guaiFENesin (MUCINEX) 600 MG 12 hr tablet Take 1 tablet (600 mg total) by mouth 2 (two) times daily. 10/13/17 10/13/18 Yes Florencia Reasons, MD  HYDROcodone-acetaminophen (NORCO/VICODIN) 5-325 MG tablet Take 1 tablet by mouth every 8 (eight) hours as needed for moderate pain.    Yes [provider]  Loperamide HCl (IMODIUM A-D PO) Take 2 tablets by mouth daily as needed (diarrhea).   Yes [provider]  nystatin (MYCOSTATIN/NYSTOP) powder APPLY TO AFFECTED AREA 3 TIMES A DAY 10/30/17  Yes [provider]  OXYGEN Inhale 2 L/min into the lungs at bedtime.   Yes [provider]  Probiotic Product (PROBIOTIC PO) Take 1 tablet by mouth daily.   Yes [provider]  prochlorperazine (COMPAZINE) 10 MG tablet Take 1 tablet (10 mg total) by mouth every 6 (six) hours as needed for nausea or vomiting. 11/04/17  Yes Curt Bears, MD  sulfamethoxazole-trimethoprim (BACTRIM DS,SEPTRA DS) 800-160 MG tablet Take 1 tablet by mouth 2 (two) times daily. 11/18/17  Yes Alla Feeling, NP  furosemide (LASIX) 20 MG tablet Take 1 tablet (20 mg total) by mouth 2 (two) times daily. Patient not taking: Reported on 11/20/2017 09/08/17   Dessa Phi, DO  ipratropium (ATROVENT) 0.02 % nebulizer solution Take 2.5 mLs (0.5 mg total) by nebulization 4 (four) times daily. Patient not taking: Reported on 11/20/2017 07/15/17   Parrett, Fonnie Mu, NP  levalbuterol (XOPENEX) 1.25 MG/0.5ML nebulizer solution Take 1.25 mg by nebulization every 8 (eight) hours as needed for wheezing or shortness of breath. Patient  not taking: Reported on 11/20/2017 10/13/17   Florencia Reasons, MD  metoprolol tartrate (LOPRESSOR) 25 MG tablet TAKE 1 TABLET (25 MG TOTAL) BY MOUTH 2 (TWO) TIMES DAILY. Patient not taking: Reported on 11/20/2017 09/11/15   Lorretta Harp, MD    Allergies:    Allergies  Allergen Reactions  . Statins Other (See Comments)    Muscle aches - tolerating Vytorin    Social History:   Social History   Socioeconomic History  . Marital status: Married    Spouse name: Not on file  . Number of children: 2  . Years of education: 34  . Highest education level: Not on file  Occupational History  . Occupation: retired Airline pilot in [employer]  . Financial resource strain: Not on file  . Food insecurity:    Worry: Not on file    Inability: Not on file  . Transportation needs:    Medical: Not on file    Non-medical: Not on file  Tobacco Use  . Smoking status: Former Smoker    Packs/day: 0.00    Years: 30.00    Pack years: 0.00    Types: Pipe    Last attempt to quit: 06/24/2008    Years since quitting: 9.4  . Smokeless tobacco: Never Used  Substance and  Sexual Activity  . Alcohol use: No  . Drug use: No  . Sexual activity: Not Currently  Lifestyle  . Physical activity:    Days per week: Not on file    Minutes per session: Not on file  . Stress: Not on file  Relationships  . Social connections:    Talks on phone: Not on file    Gets together: Not on file    Attends religious service: Not on file    Active member of club or organization: Not on file    Attends meetings of clubs or organizations: Not on file    Relationship status: Not on file  . Intimate partner violence:    Fear of current or ex partner: Not on file    Emotionally abused: Not on file    Physically abused: Not on file    Forced sexual activity: Not on file  Other Topics Concern  . Not on file  Social History Narrative   Drinks about 6-7 caffeine drinks a day     Family History:   The patient's family history includes Colon polyps in his brother; Congestive Heart Failure in his brother; Coronary artery disease in his unknown relative; Heart disease in his father and mother; Stomach cancer in his paternal grandmother. There is no history of Colon cancer, Pancreatic cancer, Rectal cancer, or Esophageal cancer.  ROS:  Please see the history of present illness.  All other ROS reviewed and negative.     Physical Exam/Data:   Vitals:   11/21/17 0500 11/21/17 0600 11/21/17 0700 11/21/17 0804  BP:  120/81 (!) 152/103   Pulse: (!) 131 (!) 139 (!) 146   Resp: (!) 26 20 (!) 32   Temp:    (!) 97.5 F (36.4 C)  TempSrc:    Axillary  SpO2: 96% 97% 97% 98%  Weight: 248 lb 7.3 oz (112.7 kg)     Height:        Intake/Output Summary (Last 24 hours) at 11/21/2017 0829 Last data filed at 11/21/2017 0600 Gross per 24 hour  Intake 591.19 ml  Output 600 ml  Net -8.81 ml   Filed Weights   11/21/17 0000  11/21/17 0500  Weight: 244 lb 0.8 oz (110.7 kg) 248 lb 7.3 oz (112.7 kg)   Body mass index is 34.65 kg/m.  General: Well developed, well nourished WM in no acute  distress. Increased WOB Head: Normocephalic, atraumatic, sclera non-icteric, no xanthomas, nares are without discharge. Neck: Negative for carotid bruits. JVD not elevated. Lungs: Diffuse wheezing, speaks in short sentences, on [place] O2. Heart: Irregularly irregular, tachycardic with S1 S2. No murmurs, rubs, or gallops appreciated. Abdomen: Soft, non-tender, non-distended with normoactive bowel sounds. No hepatomegaly. No rebound/guarding. No obvious abdominal masses. Msk:  Strength and tone appear normal for age. Extremities: No clubbing or cyanosis. No edema.  Distal pedal pulses are 2+ and equal bilaterally. Neuro: Alert and oriented X 3. No facial asymmetry. No focal deficit. Moves all extremities spontaneously. Psych:  Responds to questions appropriately with a normal affect.  EKG:  The EKG was personally reviewed and demonstrates atrial flutter 157bpm with diffuse STT abnormalities  Relevant CV Studies: Referenced above.  Laboratory Data:  Chemistry Recent Labs  Lab 11/18/17 0934 11/20/17 1612  NA 133* 138  K 3.9 3.5  CL 99 101  CO2 21* 24  GLUCOSE 132 161*  BUN 23 22*  CREATININE 1.33* 1.78*  CALCIUM 9.1 8.7*  GFRNONAA 50* 35*  GFRAA 58* 41*  ANIONGAP 13* 13    Recent Labs  Lab 11/18/17 0934 11/20/17 1612  PROT 6.0* 6.3*  ALBUMIN 2.6* 2.9*  AST 18 20  ALT 15 15*  ALKPHOS 86 100  BILITOT 0.5 0.4   Hematology Recent Labs  Lab 11/18/17 0934 11/20/17 1612  WBC 18.2* 34.0*  RBC 4.11* 4.38  HGB 11.0* 11.9*  HCT 32.8* 35.6*  MCV 79.8 81.3  MCH 26.7* 27.2  MCHC 33.5 33.4  RDW 15.3* 15.0  PLT 370 371   Cardiac Enzymes Recent Labs  Lab 11/20/17 2317  TROPONINI 0.03*    Recent Labs  Lab 11/20/17 1653  TROPIPOC 0.04    BNP Recent Labs  Lab 11/20/17 1620  BNP 445.0*    DDimer No results for input(s): DDIMER in the last 168 hours.  Radiology/Studies:  Ct Angio Chest Pe W And/or Wo Contrast  Result Date: 11/20/2017 CLINICAL DATA:  Brought from  cancer center post radiation treatment, Pt presents with dyspnea, diaphoretic, hypertensive and reported hypoxia, sp02 low 80s, placed on 6lts at center. Sp02 on arrival 99 on 2ltr nasal canula. History of stage IV lung cancer. EXAM: CT ANGIOGRAPHY CHEST WITH CONTRAST TECHNIQUE: Multidetector CT imaging of the chest was performed using the standard protocol during bolus administration of intravenous contrast. Multiplanar CT image reconstructions and MIPs were obtained to evaluate the vascular anatomy. CONTRAST:  64mL ISOVUE-370 IOPAMIDOL (ISOVUE-370) INJECTION 76% COMPARISON:  11/11/2017, 10/23/2017 FINDINGS: Cardiovascular: There is significant artifact from numerous calcified mediastinal and hilar lymph nodes. The pulmonary arteries are well opacified. There are filling defects within small RIGHT LOWER lobe pulmonary arteries consistent with pulmonary embolus. There is marked narrowing of the LEFT pulmonary artery secondary to mediastinal and hilar mass. There is obstruction of the LEFT pulmonary veins, stable in appearance. Coronary artery calcifications are present. Mediastinum/Nodes: Stable, large soft tissue mass involving the LEFT hilum and mediastinum. Lungs/Pleura: There is near complete obstruction of the LEFT mainstem bronchus. There is significant effacement of the RIGHT mainstem bronchus. There is near complete opacification of the LEFT lung with significantly less aerated lung in the UPPER lobe compared to prior studies. Findings are consistent with a combination of tumor and atelectasis. There are  small amounts of pleural air anteriorly on the LEFT following thoracentesis earlier today. Moderate pleural effusion persists. Upper Abdomen: Cholecystectomy. Musculoskeletal: Degenerative changes in the thoracic spine. No suspicious lytic or blastic lesions. Review of the MIP images confirms the above findings. IMPRESSION: 1. Suspect small pulmonary emboli within the RIGHT LOWER lobe pulmonary artery  branches. 2. Near complete obstruction of the LEFT mainstem bronchus. This results in near complete atelectasis of the LEFT UPPER and LOWER lobes. 3. Persistent moderate LEFT pleural effusion. 4. Extrinsic compression and resulting effacement of the LEFT pulmonary arteries. 5. Large mediastinal and LEFT hilar mass, stable in appearance. These results were called by telephone at the time of interpretation on 11/20/2017 at 6:43 pm to Dr. Shirlyn Goltz , who verbally acknowledged these results. Electronically Signed   By: Nolon Nations M.D.   On: 11/20/2017 18:46   Dg Chest Port 1 View  Result Date: 11/20/2017 CLINICAL DATA:  Post thoracentesis. EXAM: PORTABLE CHEST 1 VIEW COMPARISON:  Chest x-ray Nov 20, 2017 FINDINGS: Complete opacification left hemithorax remains. No definitive pneumothorax. The right lung remains clear. No other changes. IMPRESSION: Continued complete opacification of the left hemithorax without convincing evidence of pneumothorax. Electronically Signed   By: Dorise Bullion III M.D   On: 11/20/2017 18:45   Dg Chest Port 1 View  Result Date: 11/20/2017 CLINICAL DATA:  History of lung carcinoma EXAM: PORTABLE CHEST 1 VIEW COMPARISON:  PET-CT of 11/11/2017 and chest x-ray of 10/24/2017 FINDINGS: There is now near complete opacification of the left hemithorax with slight mediastinal shift to the right. This likely suggests a large right pleural effusion. Heart size is difficult to assess. The right lung is clear. Reverse left shoulder arthroplasty is noted. IMPRESSION: 1. Near complete opacification of the left hemithorax with some mediastinal shift to the right indicating a large left pleural effusion. 2. The right lung is grossly clear. Electronically Signed   By: Ivar Drape M.D.   On: 11/20/2017 17:04    Assessment and Plan:   1. Recently diagnosed advanced lung cancer with acute on chronic respiratory failure with possible postobstructive pneumonia with marked leukocytosis, near  complete obstruction of the left mainstem bronchus, compression of the left pulmonary arteries 2. Small pulmonary emboli by CT this admission 3. Recurrent left pleural effusion 4. Recurrent paroxysmal atrial fib/flutter with RVR 5. Acute kidney injury 6. Minimally elevated troponin, with prior h/o minimal CAD 2016 (likely demand ischemia)  Mr. Wender appears quite ill. He has had a very tumultuous course lately with several features that indicate a very guarded prognosis. His atrial arrhythmias are likely reactive to his severe intrathoracic disease and pulmonary compression and in this setting are typically quite difficult to control as the disease progresses. We have measures we can initiate for temporizing the heart rate but this will be more for symptom- sake than complete elimination of the arrhythmia. Acute cardioversion would be unlikely to be of any benefit as he would likely just revert back to this rhythm given its paroxysmal nature over the last several months. This could be reconsidered contingent on acute issues settling down and reloading patient with higher standing dose of amiodarone. Cannot use beta blocker given acute wheezing. He received 10mg  IV diltiazem bolus yesterday PM but does not appear to have been on a drip. Last LVEF in 08/2017 had improved so this might be our only option aside from amiodarone. BP was 130/90 but is now 115/77. I reviewed with Dr. Meda Coffee and she wants to lay eyes  on pt first. Eliquis on hold in case invasive procedures are needed (on IV heparin). Given his acute PE while on anticoagulation, may actually need to consider long term Lovenox as this may be malignancy related. Input from heme/onc may be useful.   For questions or updates, please contact [place] Please consult [email or web address] for contact info under Cardiology/STEMI.   Signed, Charlie Pitter, PA-C  11/21/2017 8:29 AM  The patient was seen, examined and discussed with Melina Copa, PA-C and I  agree with the above.   77 y.o. male with a hx of recently diagnosed advanced stage 4 squamous cell carcinoma of the lung (chemotherapy with Keytruda, Aloxi, Carboplatin, Paclitaxel, and Emend with decadron and began palliative radiation.), sarcoidosis, COPD, former pipe use/occupational exposure, subdural hematoma 2010 (several weeks after a fall), OSA, NICM/chronic combined CHF (EF 20-25% in 2016 with minimal CAD, EF 50-55% in 08/2017), PAF/paroxysmal atrial flutter, who is being seen today for the evaluation of recurrent atrial fibrillation with RVR. Admitted in 3/19 with respiratory failure and atrial flutter. He was anticoagulated during his hospitalization however this was stopped at discharge due to h/o SDH. Echo showed improvement in LV function with EF 50-55%, grade 1 DD, mild pulm HTN. He was admitted 09/2017 with recurrent paroxysm of PAF on telemetry while in the hospital with a left pleural effusion requiring thoracentesis. He was placed on Eliquis and continued on amiodarone but patient stopped Eliquis on his own once he found out he had lung cancer. At OP f/u 10/22/17 because he was in NSR, amiodarone was decreased to 100mg  and Eliquis was resumed. Readmitted yesterday with SOB,  recurrent atrial fibrillation with RVR with VR 160 BPM, CT angio showed suspected small PE in the RLL with near complete obstruction of the left mainstem bronchus, persistent moderate left pleural effusion, extrinsic compression and resulting effacement of the LEFT pulmonary arteries, large mediastinal and left hilar mass stable in appearance. Labs show marked hypomagnesemia 1.3, AKI with BUN/Cr 22/1.78, albumin 3.6, BNP 445, marked leukocytosis of 34K. He is being treated with magnesium, antibiotics, heparin, IV amiodarone load and drip, ventricular rates remain in 130-140. Eliquis was switched to iv Heparin. I will add  We will focus on rate control, we will start Cardizem drip and follow. He is also fluid overloaded, I  would start lasix 40 mg iv BID.   Ena Dawley, MD 11/21/2017

## 2017-11-21 NOTE — Progress Notes (Signed)
ANTICOAGULATION CONSULT NOTE   Pharmacy Consult for heparin Indication:hx  atrial fibrillation; suspects new acute PE (home Eliquis on hold)  Allergies  Allergen Reactions  . Statins Other (See Comments)    Muscle aches - tolerating Vytorin    Patient Measurements: Height: 5\' 11"  (180.3 cm) Weight: 248 lb 7.3 oz (112.7 kg) IBW/kg (Calculated) : 75.3 Heparin Dosing Weight: 99 kg  Vital Signs: Temp: 97.5 F (36.4 C) (05/31 0804) Temp Source: Axillary (05/31 0804) BP: 132/90 (05/31 0800) Pulse Rate: 154 (05/31 0800)  Labs: Recent Labs    11/18/17 0934 11/20/17 1612 11/20/17 2028 11/20/17 2317 11/21/17 0727  HGB 11.0* 11.9*  --   --  10.6*  HCT 32.8* 35.6*  --   --  32.7*  PLT 370 371  --   --  298  APTT  --   --  33  --   --   HEPARINUNFRC  --   --  >2.20*  --   --   CREATININE 1.33* 1.78*  --   --   --   TROPONINI  --   --   --  0.03*  --     Estimated Creatinine Clearance: 44.4 mL/min (A) (by C-G formula based on SCr of 1.78 mg/dL (H)).   Medications:  - Eliquis 5 mg bid PTA (last dose taken on 5/30 at 11a)  Assessment: Patient's a 77 y.o M with hx lung cancer and afib on Eliquis PTA, presented to the ED on 5/30 with c/o SOB and back pain.  Chest CTA on 5/30 showed moderate L pleural effusion (thoracentesis performed in the ED at ~6p) and findings with suspicion for small RLL PE.  Eliquis placed on hold and patient was started on heparin drip for afib and PE.  Today, 11/21/2017: - aPTT elevated at 134 sec, heparin level >2.20 (but Eliquis can cause this to be elevated) - hgb down slightly 10.6, plts 298 - per patient, no bleeding noted - scr down 1.5 (crcl~42 N)  Goal of Therapy:  Heparin level 0.3-0.7 units/ml aPTT 66-102 seconds Monitor platelets by anticoagulation protocol: Yes   Plan:  - hold heparin drip for 1 hr, then reduce heparin drip to 1300 units/hr - check 8 hr aPTT. Will adjust heparin rate based on aPTT for now until both aPTT and heparin  level correlate d/t residual effect of Eliquis - monitor for s/s bleeding   Patient's currently on vancomycin and zosyn for PNA.  - will adjust vancomycin to 1250 mg IV q24h for est AUC 524 - continue zosyn 3.375 gm IV q8h (infuse over 4 hrs) - with MRSA PCR neg-- consider d/c vancomycin - monitor renal function closely  Keyleen Cerrato P 11/21/2017,8:52 AM

## 2017-11-21 NOTE — Progress Notes (Signed)
PROGRESS NOTE [facility]   [name].   ZOX:[id number] DOB: 1941-03-11  DOA: 11/20/2017 PCP: Crist Infante, MD   Brief Narrative:  Travis Ferguson. is a 77 year old male with medical history significant for NSCLC recently started on chemo and radiation, COPD, OSA, sarcoidosis and chronic pain syndrome presented to the emergency department complaining of worsening of shortness of breath.  On ED evaluation patient was found to have a large pleural effusion, EDP performed thoracentesis at bedside.  Patient was also found to be in A. fib with RVR along with hypotension.  CTA chest was done which shows small pulmonary embolism on the right, with left pleural effusion and near total obstruction of the left main bronchus.  Patient was admitted with working diagnosis of acute on chronic respiratory failure due to malignant pleural effusion complicated with A. fib with RVR.  Cardiology and PCCM has been consulted.  Subjective: Patient seen and examined, continues to be short of breath, even after thoracentesis.  Difficulty with speaking.  Denies chest pain.  No other concerns at this time.  Afebrile.  Assessment & Plan: Acute on chronic respiratory failure with hypoxia Multifactorial from pleural effusion, Afib with RVR and ? Post obstructive PNA with severe leukocytosis pulmonary embolism and COPD. Will add Solumedrol, continue nebulizer treatment, will add Brovana. Continue O2 supplementation to keep O2 sat > 91%  CXR from this morning still with large pleural fluid, may need a Pleurex, will await PCCM recommendations.   Recurrent malignant pleural effusion  Recently diagnosed with NSCLC S/p Thoracentesis in the ED yield 500 cc  Started radio and chemo this month, follow by Dr Earlie Server  ? Pleurex, IR consulted  Pulmonary embolism Continue heparin, PCCM recommendations appreciated, trial of BiPAP. ?  Thrombolysis, continue management per PCCM  A fib with RVR  In setting of  compressive mass  Continue Amio, follow up cardiology recommendations, adding Cardizem drip  ? Post obstructive PNA  Severe leukocytosis  Agree with Zosyn and Vanc Procalcitonin not a good indicator on patient receiving active cancer treatment. Follow pulmonary recommendation   AKI  Likely prerenal, from hypotension Got 2 L of NS in the ED, will add gentle continuous hydration  Monitor renal function  Combine CHF  No sings of fluid overload, pleural effusion is from malignancy   Continue to monitor   COPD/OSA Continue CPAP  See above   NSCLC  Started radio and chemo this month, follow by Dr Earlie Server, poor prognosis  Will get Palliative consult for goals of care and support   DVT prophylaxis: Heparin Sq Code Status: Partial Code (DNI) Family Communication: None at bedside  Disposition Plan: To be determine   Consultants:   Cardiology  PCCM  Procedures:   None  Antimicrobials: Anti-infectives (From admission, onward)   Start     Dose/Rate Route Frequency Ordered Stop   11/22/17 0500  vancomycin (VANCOCIN) 1,250 mg in sodium chloride 0.9 % 250 mL IVPB     1,250 mg 166.7 mL/hr over 90 Minutes Intravenous Every 24 hours 11/21/17 0911     11/21/17 0600  vancomycin (VANCOCIN) 1,500 mg in sodium chloride 0.9 % 500 mL IVPB  Status:  Discontinued     1,500 mg 250 mL/hr over 120 Minutes Intravenous Every 48 hours 11/20/17 2314 11/21/17 0911   11/21/17 0400  piperacillin-tazobactam (ZOSYN) IVPB 3.375 g     3.375 g 12.5 mL/hr over 240 Minutes Intravenous Every 8 hours 11/20/17 2314     11/20/17 1800  vancomycin (  VANCOCIN) IVPB 1000 mg/200 mL premix     1,000 mg 200 mL/hr over 60 Minutes Intravenous  Once 11/20/17 1750 11/21/17 0036   11/20/17 1800  ceFEPIme (MAXIPIME) 2 g in sodium chloride 0.9 % 100 mL IVPB     2 g 200 mL/hr over 30 Minutes Intravenous  Once 11/20/17 1750 11/20/17 2048       Objective: Vitals:   11/21/17 0600 11/21/17 0700 11/21/17 0800 11/21/17  0804  BP: 120/81 (!) 152/103 132/90   Pulse: (!) 139 (!) 146 (!) 154   Resp: 20 (!) 32 (!) 23   Temp:    (!) 97.5 F (36.4 C)  TempSrc:    Axillary  SpO2: 97% 97% 97% 98%  Weight:      Height:        Intake/Output Summary (Last 24 hours) at 11/21/2017 0846 Last data filed at 11/21/2017 0600 Gross per 24 hour  Intake 591.19 ml  Output 600 ml  Net -8.81 ml   Filed Weights   11/21/17 0000 11/21/17 0500  Weight: 110.7 kg (244 lb 0.8 oz) 112.7 kg (248 lb 7.3 oz)    Examination:  General exam: Mild distress due to shortness of breath Respiratory system: Decreased breath sounds on the left, diffuse wheezing on the right Cardiovascular system: Irregularly irregular, tachycardic, S1-S2 no murmurs Gastrointestinal system: Abdomen is nondistended, soft and nontender.  Central nervous system: Alert and oriented. No focal neurological deficits. Extremities: No pedal edema.  Skin: No rashes, lesions or ulcers Psychiatry: Judgement and insight appear normal.   Data Reviewed: I have personally reviewed following labs and imaging studies  CBC: Recent Labs  Lab 11/18/17 0934 11/20/17 1612 11/21/17 0727  WBC 18.2* 34.0* 29.0*  NEUTROABS 14.6* 30.3*  --   HGB 11.0* 11.9* 10.6*  HCT 32.8* 35.6* 32.7*  MCV 79.8 81.3 81.8  PLT 370 371 811   Basic Metabolic Panel: Recent Labs  Lab 11/18/17 0934 11/20/17 1612 11/20/17 2317  NA 133* 138  --   K 3.9 3.5  --   CL 99 101  --   CO2 21* 24  --   GLUCOSE 132 161*  --   BUN 23 22*  --   CREATININE 1.33* 1.78*  --   CALCIUM 9.1 8.7*  --   MG  --   --  1.3*   GFR: Estimated Creatinine Clearance: 44.4 mL/min (A) (by C-G formula based on SCr of 1.78 mg/dL (H)). Liver Function Tests: Recent Labs  Lab 11/18/17 0934 11/20/17 1612  AST 18 20  ALT 15 15*  ALKPHOS 86 100  BILITOT 0.5 0.4  PROT 6.0* 6.3*  ALBUMIN 2.6* 2.9*   No results for input(s): LIPASE, AMYLASE in the last 168 hours. No results for input(s): AMMONIA in the  last 168 hours. Coagulation Profile: No results for input(s): INR, PROTIME in the last 168 hours. Cardiac Enzymes: Recent Labs  Lab 11/20/17 2317  TROPONINI 0.03*   BNP (last 3 results) Recent Labs    08/08/17 0958  PROBNP 92.0   HbA1C: No results for input(s): HGBA1C in the last 72 hours. CBG: No results for input(s): GLUCAP in the last 168 hours. Lipid Profile: No results for input(s): CHOL, HDL, LDLCALC, TRIG, CHOLHDL, LDLDIRECT in the last 72 hours. Thyroid Function Tests: Recent Labs    11/20/17 2317  TSH 1.396   Anemia Panel: No results for input(s): VITAMINB12, FOLATE, FERRITIN, TIBC, IRON, RETICCTPCT in the last 72 hours. Sepsis Labs: Recent Labs  Lab 11/20/17 1656  11/20/17 1934  LATICACIDVEN 2.61* 1.14    Recent Results (from the past 240 hour(s))  Urine Culture     Status: None   Collection Time: 11/18/17 11:59 AM  Result Value Ref Range Status   Specimen Description   Final    URINE, CLEAN CATCH Performed at [facility], [address], [place], [place] [address]    Special Requests   Final    NONE Performed at [facility], [place] [address] [place], [place] [address]    Culture   Final    NO GROWTH Performed at [facility], [place] [address], [place], [place] [address]    Report Status 11/19/2017 FINAL  Final  MRSA PCR Screening     Status: None   Collection Time: 11/20/17 11:02 PM  Result Value Ref Range Status   MRSA by PCR NEGATIVE NEGATIVE Final    Comment:        The GeneXpert MRSA Assay (FDA approved for NASAL specimens only), is one component of a comprehensive MRSA colonization surveillance program. It is not intended to diagnose MRSA infection nor to guide or monitor treatment for MRSA infections. Performed at [facility], [place] [address], [place], [place] [address]       Radiology Studies: Ct Angio Chest Pe W And/or Wo Contrast  Result Date:  11/20/2017 CLINICAL DATA:  Brought from cancer center post radiation treatment, Pt presents with dyspnea, diaphoretic, hypertensive and reported hypoxia, sp02 low 80s, placed on 6lts at center. Sp02 on arrival 99 on 2ltr nasal canula. History of stage IV lung cancer. EXAM: CT ANGIOGRAPHY CHEST WITH CONTRAST TECHNIQUE: Multidetector CT imaging of the chest was performed using the standard protocol during bolus administration of intravenous contrast. Multiplanar CT image reconstructions and MIPs were obtained to evaluate the vascular anatomy. CONTRAST:  31mL ISOVUE-370 IOPAMIDOL (ISOVUE-370) INJECTION 76% COMPARISON:  11/11/2017, 10/23/2017 FINDINGS: Cardiovascular: There is significant artifact from numerous calcified mediastinal and hilar lymph nodes. The pulmonary arteries are well opacified. There are filling defects within small RIGHT LOWER lobe pulmonary arteries consistent with pulmonary embolus. There is marked narrowing of the LEFT pulmonary artery secondary to mediastinal and hilar mass. There is obstruction of the LEFT pulmonary veins, stable in appearance. Coronary artery calcifications are present. Mediastinum/Nodes: Stable, large soft tissue mass involving the LEFT hilum and mediastinum. Lungs/Pleura: There is near complete obstruction of the LEFT mainstem bronchus. There is significant effacement of the RIGHT mainstem bronchus. There is near complete opacification of the LEFT lung with significantly less aerated lung in the UPPER lobe compared to prior studies. Findings are consistent with a combination of tumor and atelectasis. There are small amounts of pleural air anteriorly on the LEFT following thoracentesis earlier today. Moderate pleural effusion persists. Upper Abdomen: Cholecystectomy. Musculoskeletal: Degenerative changes in the thoracic spine. No suspicious lytic or blastic lesions. Review of the MIP images confirms the above findings. IMPRESSION: 1. Suspect small pulmonary emboli within the  RIGHT LOWER lobe pulmonary artery branches. 2. Near complete obstruction of the LEFT mainstem bronchus. This results in near complete atelectasis of the LEFT UPPER and LOWER lobes. 3. Persistent moderate LEFT pleural effusion. 4. Extrinsic compression and resulting effacement of the LEFT pulmonary arteries. 5. Large mediastinal and LEFT hilar mass, stable in appearance. These results were called by telephone at the time of interpretation on 11/20/2017 at 6:43 pm to Dr. Shirlyn Goltz , who verbally acknowledged these results. Electronically Signed   By: Benjamine Mola  Owens Shark M.D.   On: 11/20/2017 18:46   Dg Chest Port 1 View  Result Date: 11/20/2017 CLINICAL DATA:  Post thoracentesis. EXAM: PORTABLE CHEST 1 VIEW COMPARISON:  Chest x-ray Nov 20, 2017 FINDINGS: Complete opacification left hemithorax remains. No definitive pneumothorax. The right lung remains clear. No other changes. IMPRESSION: Continued complete opacification of the left hemithorax without convincing evidence of pneumothorax. Electronically Signed   By: Dorise Bullion III M.D   On: 11/20/2017 18:45   Dg Chest Port 1 View  Result Date: 11/20/2017 CLINICAL DATA:  History of lung carcinoma EXAM: PORTABLE CHEST 1 VIEW COMPARISON:  PET-CT of 11/11/2017 and chest x-ray of 10/24/2017 FINDINGS: There is now near complete opacification of the left hemithorax with slight mediastinal shift to the right. This likely suggests a large right pleural effusion. Heart size is difficult to assess. The right lung is clear. Reverse left shoulder arthroplasty is noted. IMPRESSION: 1. Near complete opacification of the left hemithorax with some mediastinal shift to the right indicating a large left pleural effusion. 2. The right lung is grossly clear. Electronically Signed   By: Ivar Drape M.D.   On: 11/20/2017 17:04      Scheduled Meds: . amiodarone  100 mg Oral Daily  . atorvastatin  20 mg Oral q1800  . budesonide (PULMICORT) nebulizer solution  0.25 mg  Nebulization BID  . DULoxetine  30 mg Oral Daily  . FLUoxetine  40 mg Oral Q breakfast  . guaiFENesin  600 mg Oral BID  . levalbuterol  0.63 mg Nebulization Q6H   Continuous Infusions: . amiodarone 30 mg/hr (11/21/17 0841)  . heparin 1,600 Units/hr (11/21/17 0300)  . magnesium sulfate 1 - 4 g bolus IVPB 2 g (11/21/17 0823)  . piperacillin-tazobactam (ZOSYN)  IV Stopped (11/21/17 0820)  . vancomycin Stopped (11/21/17 0735)     LOS: 1 day    Time spent: Total of 35 minutes spent with pt, greater than 50% of which was spent in discussion of  treatment, counseling and coordination of care   Chipper Oman, MD Pager: Text Page via [email or web address]   If 7PM-7AM, please contact night-coverage [email or web address] 11/21/2017, 8:46 AM   Note - This record has been created using [employer]. Chart creation errors have been sought, but may not always have been located. Such creation errors do not reflect on the standard of medical care.

## 2017-11-21 NOTE — Progress Notes (Signed)
[facility] PCCM  Doing better on PM rounds, HR improved, Respirations improved Hold off on considering TPA unless dyspnea worsens  Roselie Awkward, MD [place] [facility]M Pager: [phone] Cell: [phone] After 3pm or if no response, call [phone]

## 2017-11-21 NOTE — Progress Notes (Addendum)
ANTICOAGULATION CONSULT NOTE   Pharmacy Consult for heparin Indication:hx  atrial fibrillation; suspects new acute PE (home Eliquis on hold)  Allergies  Allergen Reactions  . Statins Other (See Comments)    Muscle aches - tolerating Vytorin    Patient Measurements: Height: 5\' 11"  (180.3 cm) Weight: 248 lb 7.3 oz (112.7 kg) IBW/kg (Calculated) : 75.3 Heparin Dosing Weight: 99 kg  Vital Signs: Temp: 97.7 F (36.5 C) (05/31 1946) Temp Source: Oral (05/31 1946) BP: 148/76 (05/31 1900) Pulse Rate: 112 (05/31 1900)  Labs: Recent Labs    11/20/17 1612 11/20/17 2028 11/20/17 2317 11/21/17 0727 11/21/17 1043 11/21/17 1904  HGB 11.9*  --   --  10.6*  --   --   HCT 35.6*  --   --  32.7*  --   --   PLT 371  --   --  298  --   --   APTT  --  33  --  134*  --  71*  HEPARINUNFRC  --  >2.20*  --  >2.20*  --   --   CREATININE 1.78*  --   --  1.50*  --   --   TROPONINI  --   --  0.03* <0.03 0.03*  --     Estimated Creatinine Clearance: 52.7 mL/min (A) (by C-G formula based on SCr of 1.5 mg/dL (H)).   Medications:  - Eliquis 5 mg bid PTA (last dose taken on 5/30 at 11a)  Assessment: Patient's a 77 y.o M with hx lung cancer and afib on Eliquis PTA, presented to the ED on 5/30 with c/o SOB and back pain.  Chest CTA on 5/30 showed moderate L pleural effusion (thoracentesis performed in the ED at ~6p) and findings with suspicion for small RLL PE.  Eliquis placed on hold and patient was started on heparin drip for a-fib and PE.  Today, 11/21/2017: - 1904 aPTT = 71 seconds, now therapeutic after heparin infusion rate decreased to 1300 units/hr  - Hgb down slightly 10.6, Pltc WNL - no bleeding or infusion issues noted per nursing  Goal of Therapy:  Heparin level 0.3-0.7 units/ml aPTT 66-102 seconds Monitor platelets by anticoagulation protocol: Yes   Plan:  - Continue heparin infusion at 1300 units/hr - Check aPTT and heparin level in 8 hours. Will adjust heparin rate based on  aPTT for now until both aPTT and heparin level correlate d/t residual effect of Eliquis - Daily CBC, heparin level - Monitor closely for s/sx of bleeding    Lindell Spar, PharmD, BCPS Pager: [phone] 11/21/2017 8:11 PM

## 2017-11-21 NOTE — Progress Notes (Signed)
CRITICAL VALUE ALERT  Critical Value: Troponin 0.03  Date & Time Notied:  11/21/2017  1207  Provider Notified: Marni Griffon NP  Orders Received/Actions taken: Awaiting orders

## 2017-11-21 NOTE — Progress Notes (Signed)
CRITICAL VALUE ALERT  Critical Value: Troponin 0.03  Date & Time Notied:  0007 11/21/17  Provider Notified: Yes  Orders Received/Actions taken: Awaiting Orders

## 2017-11-21 NOTE — Consult Note (Signed)
Name: Rondle Lohse. MRN: [id number] DOB: September 12, 1940    ADMISSION DATE:  11/20/2017 CONSULTATION DATE:  11/21/17  REFERRING MD :  Dr. Hal Hope  CHIEF COMPLAINT:  Shortness of breath  BRIEF PATIENT DESCRIPTION:  77 y.o. Male admitted on 5/30 with SOB.  Pt with Acute Hypoxic Respiratory failure, multifactorial in nature, secondary to: Acute Right PE, AECOPD, superimposed on subacute post-obstructive left lung atelectasias in setting of Left Lung non-small cell, squamous cell carcinoma, ? HCAP; Further complicated by recurrent left pleural effusion.  Now requiring Bipap for increased work of breathing.  Also with A-fib w/ RVR likely in setting of acute respiratory failure.   SIGNIFICANT EVENTS  5/30>>Admitted to [facility] for Acute Hypoxic Respiratory Failure after Radiation tx 5/31>> PCCM consulted  ANTIBIOTICS: 5/30>> Cefepime x1 dose 5/30 Vancomycin>> 5/31 Zosyn>>  STUDIES:  CT Chest 5/30>> Suspect small pulmonary emboli within the RIGHT LOWER lobe pulmonary artery branches. Near complete obstruction of the LEFT mainstem bronchus. This results in near complete atelectasis of the LEFT UPPER and LOWER lobes. Persistent moderate LEFT pleural effusion. Extrinsic compression and resulting effacement of the LEFT pulmonary arteries. Large mediastinal and LEFT hilar mass, stable in appearance.   HISTORY OF PRESENT ILLNESS:   Mr. Veal is a 77 y.o. Male who presented to [facility] with c/o Shortness of breath.  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  He reports SOB that began 4-5 days ago, that has progressively worsened.  He received radiation on 5/30 in the cancer clinic, then developed worsening dyspnea post radiation, prompting him to seek care at his Oncologist's office on 5/30.  He was referred to the ED due to the severity of his dyspnea, diaphoresis, and hypoxia.      In the ED initial workup, CXR was concerning for near complete opacification of left lung with mediastinal shift to right indicative of large left pleural effusion, of which a thoracentesis was performed in ED draining 500 ml. CT Chest 5/30 revealed RLL Pulmonary Embolism, and near obstruction of left mainstem bronchus with near complete Atelectasis of the Left lung.   Pt was placed on supplemental O2, Heparin gtt, Amiodarone gtt.  On 5/31, pt with increased work of breathing, necessitating Bipap.   PCCM is consulted for further evaluation/management of Acute Respiratory Failure.  PAST MEDICAL HISTORY :   has a past medical history of Anxiety, Arthritis, Atherosclerosis, CAP (community acquired pneumonia) (02/24/2015), Cervical disc disease, Chronic combined systolic and diastolic CHF (congestive heart failure) ([place]), Chronic lower back pain, Colon polyps (08/02/2014), COPD (chronic obstructive pulmonary disease) ([place]), Depression, Dyslipidemia, Dyspnea, GERD (gastroesophageal reflux disease), Hiatal hernia, History of blood transfusion ("numerous"), History of oxygen administration, Hypertension, Inguinal hernia, Migraine, Mild CAD, OSA (obstructive sleep apnea), PAF (paroxysmal atrial fibrillation) ([place]), Paroxysmal atrial flutter ([place]), PTSD (post-traumatic stress disorder), Recurrent pleural effusion on left, Rhinitis, Sarcoidosis, Sensation problem, Sepsis ([place]), Squamous cell lung cancer ([place]), Steroid-induced hyperglycemia (02/25/2015), and Subdural hematoma ([place]) (11/10).  has a past surgical history that includes Video bronchoscopy (07/28/2012); Knee arthroscopy (Right); Lumbar laminectomy/decompression microdiscectomy (Right, 12/15/2012); Gallbladder surgery (09/2014); ERCP (N/A, 11/07/2014); Cholecystectomy (N/A, 11/08/2014); Cardiac catheterization (N/A, 01/23/2015); Reverse total shoulder arthroplasty (Left, 09/22/2015); Inguinal hernia repair (Left); Back surgery; Brain surgery (04/2009); Reverse shoulder  arthroplasty (Left, 09/22/2015); and Video bronchoscopy (Bilateral, 10/17/2017). Prior to Admission medications   Medication Sig Start Date End Date Taking? Authorizing Provider  albuterol (PROVENTIL HFA;VENTOLIN  HFA) 108 (90 Base) MCG/ACT inhaler 1-2 PUFFS EVERY 4-6 HOURS AS NEEDED FOR SHORTNESS OF BREATH 10/20/17  Yes Chesley Mires, MD  amiodarone (PACERONE) 100 MG tablet Take 1 tablet (100 mg total) by mouth daily. 10/22/17  Yes Kilroy, Doreene Burke, PA-C  apixaban (ELIQUIS) 5 MG TABS tablet Take 1 tablet (5 mg total) by mouth 2 (two) times daily. 10/25/17  Yes Patrecia Pour, Christean Grief, MD  atorvastatin (LIPITOR) 20 MG tablet Take 1 tablet (20 mg total) by mouth daily at 6 PM. 09/08/17  Yes Dessa Phi, DO  benzonatate (TESSALON) 200 MG capsule Take 1 capsule (200 mg total) by mouth 3 (three) times daily as needed for cough. 09/22/17  Yes Chesley Mires, MD  budesonide-formoterol (SYMBICORT) 160-4.5 MCG/ACT inhaler Inhale 2 puffs into the lungs 2 (two) times daily. 07/15/17  Yes Parrett, Tammy S, NP  cholecalciferol (VITAMIN D) 1000 units tablet Take 2,000 Units by mouth daily.   Yes [provider]  DULoxetine (CYMBALTA) 30 MG capsule Take 30 mg by mouth daily.   Yes [provider]  ENTRESTO 24-26 MG TAKE 1 TABLET BY MOUTH 2 (TWO) TIMES DAILY. 05/14/16  Yes Lorretta Harp, MD  finasteride (PROSCAR) 5 MG tablet Take 5 mg by mouth daily.  01/07/13  Yes [provider]  FLUoxetine (PROZAC) 40 MG capsule Take 40 mg by mouth daily with breakfast.    Yes [provider]  guaiFENesin (MUCINEX) 600 MG 12 hr tablet Take 1 tablet (600 mg total) by mouth 2 (two) times daily. 10/13/17 10/13/18 Yes Florencia Reasons, MD  HYDROcodone-acetaminophen (NORCO/VICODIN) 5-325 MG tablet Take 1 tablet by mouth every 8 (eight) hours as needed for moderate pain.    Yes [provider]  Loperamide HCl (IMODIUM A-D PO) Take 2 tablets by mouth daily as needed (diarrhea).   Yes [provider]    nystatin (MYCOSTATIN/NYSTOP) powder APPLY TO AFFECTED AREA 3 TIMES A DAY 10/30/17  Yes [provider]  OXYGEN Inhale 2 L/min into the lungs at bedtime.   Yes [provider]  Probiotic Product (PROBIOTIC PO) Take 1 tablet by mouth daily.   Yes [provider]  prochlorperazine (COMPAZINE) 10 MG tablet Take 1 tablet (10 mg total) by mouth every 6 (six) hours as needed for nausea or vomiting. 11/04/17  Yes Curt Bears, MD  sulfamethoxazole-trimethoprim (BACTRIM DS,SEPTRA DS) 800-160 MG tablet Take 1 tablet by mouth 2 (two) times daily. 11/18/17  Yes Alla Feeling, NP  furosemide (LASIX) 20 MG tablet Take 1 tablet (20 mg total) by mouth 2 (two) times daily. Patient not taking: Reported on 11/20/2017 09/08/17   Dessa Phi, DO  ipratropium (ATROVENT) 0.02 % nebulizer solution Take 2.5 mLs (0.5 mg total) by nebulization 4 (four) times daily. Patient not taking: Reported on 11/20/2017 07/15/17   Parrett, Fonnie Mu, NP  levalbuterol (XOPENEX) 1.25 MG/0.5ML nebulizer solution Take 1.25 mg by nebulization every 8 (eight) hours as needed for wheezing or shortness of breath. Patient not taking: Reported on 11/20/2017 10/13/17   Florencia Reasons, MD  metoprolol tartrate (LOPRESSOR) 25 MG tablet TAKE 1 TABLET (25 MG TOTAL) BY MOUTH 2 (TWO) TIMES DAILY. Patient not taking: Reported on 11/20/2017 09/11/15   Lorretta Harp, MD   Allergies  Allergen Reactions  . Statins Other (See Comments)    Muscle aches - tolerating Vytorin    FAMILY HISTORY:  family history includes Colon polyps in his brother; Congestive Heart Failure in his brother; Coronary artery disease in his unknown  relative; Heart disease in his father and mother; Stomach cancer in his paternal grandmother. SOCIAL HISTORY:  reports that he quit smoking about 9 years ago. His smoking use included pipe. He smoked 0.00 packs per day for 30.00 years. He has never used smokeless tobacco. He reports that he does not drink alcohol or  use drugs.  REVIEW OF SYSTEMS:  Positives in BOLD Constitutional: Negative for fever, chills, weight loss, malaise/fatigue and diaphoresis.  HENT: Negative for hearing loss, ear pain, nosebleeds, congestion, sore throat, neck pain, tinnitus and ear discharge.   Eyes: Negative for blurred vision, double vision, photophobia, pain, discharge and redness.  Respiratory: Negative for cough, hemoptysis, +sputum production, +shortness of breath, +wheezing and stridor.   Cardiovascular: Negative for chest pain, palpitations, orthopnea, claudication, leg swelling and PND.  Gastrointestinal: Negative for heartburn, nausea, vomiting, abdominal pain, diarrhea, constipation, blood in stool and melena.  Genitourinary: Negative for dysuria, urgency, frequency, hematuria and flank pain.  Musculoskeletal: Negative for myalgias, back pain, joint pain and falls.  Skin: Negative for itching and rash.  Neurological: Negative for dizziness, tingling, tremors, sensory change, speech change, focal weakness, seizures, loss of consciousness, weakness and headaches.  Endo/Heme/Allergies: Negative for environmental allergies and polydipsia. Does not bruise/bleed easily.  SUBJECTIVE:  Pt states he still feels SOB, experienced brief improvement with SOB after Thoracentesis in ED. Feels like his chest is "constricted" On 3L [place]  VITAL SIGNS: Temp:  [97.5 F (36.4 C)-97.9 F (36.6 C)] 97.5 F (36.4 C) (05/31 0804) Pulse Rate:  [41-156] 150 (05/31 0900) Resp:  [18-32] 26 (05/31 0900) BP: (71-178)/(57-130) 115/77 (05/31 0900) SpO2:  [88 %-100 %] 93 % (05/31 0900) Weight:  [244 lb 0.8 oz (110.7 kg)-248 lb 7.3 oz (112.7 kg)] 248 lb 7.3 oz (112.7 kg) (05/31 0500)  PHYSICAL EXAMINATION: General:  Actuely ill-appearing male, laying in bed, 3L [place], in moderate respiratory distress, mildly anxious Neuro:  A&O, Follows commands, Pupils PERRLA HEENT:  Atraumatic, normocephalic, No JVD, neck supple Cardiovascular:  Irregularly  irregular rhythm, tachycardia, No M/R/G, palpable pulses throghout Lungs:  Diffuse expiratory wheezing throughout, increased work of breathing, assessory muscle use Abdomen:  Obese, soft, non-tender, BS+ x4 Musculoskeletal:  No deformities, normal bulk and tone Skin:  Dry, warm.  No obvious rashes, lesions, or ulcerations  Recent Labs  Lab 11/18/17 0934 11/20/17 1612 11/21/17 0727  NA 133* 138 136  K 3.9 3.5 4.3  CL 99 101 103  CO2 21* 24 24  BUN 23 22* 23*  CREATININE 1.33* 1.78* 1.50*  GLUCOSE 132 161* 164*   Recent Labs  Lab 11/18/17 0934 11/20/17 1612 11/21/17 0727  HGB 11.0* 11.9* 10.6*  HCT 32.8* 35.6* 32.7*  WBC 18.2* 34.0* 29.0*  PLT 370 371 298   Ct Angio Chest Pe W And/or Wo Contrast  Result Date: 11/20/2017 CLINICAL DATA:  Brought from cancer center post radiation treatment, Pt presents with dyspnea, diaphoretic, hypertensive and reported hypoxia, sp02 low 80s, placed on 6lts at center. Sp02 on arrival 99 on 2ltr nasal canula. History of stage IV lung cancer. EXAM: CT ANGIOGRAPHY CHEST WITH CONTRAST TECHNIQUE: Multidetector CT imaging of the chest was performed using the standard protocol during bolus administration of intravenous contrast. Multiplanar CT image reconstructions and MIPs were obtained to evaluate the vascular anatomy. CONTRAST:  64mL ISOVUE-370 IOPAMIDOL (ISOVUE-370) INJECTION 76% COMPARISON:  11/11/2017, 10/23/2017 FINDINGS: Cardiovascular: There is significant artifact from numerous calcified mediastinal and hilar lymph nodes. The pulmonary arteries are well opacified. There are filling defects within small  RIGHT LOWER lobe pulmonary arteries consistent with pulmonary embolus. There is marked narrowing of the LEFT pulmonary artery secondary to mediastinal and hilar mass. There is obstruction of the LEFT pulmonary veins, stable in appearance. Coronary artery calcifications are present. Mediastinum/Nodes: Stable, large soft tissue mass involving the LEFT hilum  and mediastinum. Lungs/Pleura: There is near complete obstruction of the LEFT mainstem bronchus. There is significant effacement of the RIGHT mainstem bronchus. There is near complete opacification of the LEFT lung with significantly less aerated lung in the UPPER lobe compared to prior studies. Findings are consistent with a combination of tumor and atelectasis. There are small amounts of pleural air anteriorly on the LEFT following thoracentesis earlier today. Moderate pleural effusion persists. Upper Abdomen: Cholecystectomy. Musculoskeletal: Degenerative changes in the thoracic spine. No suspicious lytic or blastic lesions. Review of the MIP images confirms the above findings. IMPRESSION: 1. Suspect small pulmonary emboli within the RIGHT LOWER lobe pulmonary artery branches. 2. Near complete obstruction of the LEFT mainstem bronchus. This results in near complete atelectasis of the LEFT UPPER and LOWER lobes. 3. Persistent moderate LEFT pleural effusion. 4. Extrinsic compression and resulting effacement of the LEFT pulmonary arteries. 5. Large mediastinal and LEFT hilar mass, stable in appearance. These results were called by telephone at the time of interpretation on 11/20/2017 at 6:43 pm to Dr. Shirlyn Goltz , who verbally acknowledged these results. Electronically Signed   By: Nolon Nations M.D.   On: 11/20/2017 18:46   Dg Chest Port 1 View  Result Date: 11/21/2017 CLINICAL DATA:  Evaluate pleural effusion EXAM: PORTABLE CHEST 1 VIEW COMPARISON:  11/20/2017 FINDINGS: Complete whiteout of the left hemithorax again noted, stable. No confluent opacity on the right. Improved lung volumes on the right. Right hilar fullness is likely vascular when compared to prior chest CT. IMPRESSION: Continued complete opacification of the left hemithorax. Electronically Signed   By: Rolm Baptise M.D.   On: 11/21/2017 09:18   Dg Chest Port 1 View  Result Date: 11/20/2017 CLINICAL DATA:  Post thoracentesis. EXAM: PORTABLE  CHEST 1 VIEW COMPARISON:  Chest x-ray Nov 20, 2017 FINDINGS: Complete opacification left hemithorax remains. No definitive pneumothorax. The right lung remains clear. No other changes. IMPRESSION: Continued complete opacification of the left hemithorax without convincing evidence of pneumothorax. Electronically Signed   By: Dorise Bullion III M.D   On: 11/20/2017 18:45   Dg Chest Port 1 View  Result Date: 11/20/2017 CLINICAL DATA:  History of lung carcinoma EXAM: PORTABLE CHEST 1 VIEW COMPARISON:  PET-CT of 11/11/2017 and chest x-ray of 10/24/2017 FINDINGS: There is now near complete opacification of the left hemithorax with slight mediastinal shift to the right. This likely suggests a large right pleural effusion. Heart size is difficult to assess. The right lung is clear. Reverse left shoulder arthroplasty is noted. IMPRESSION: 1. Near complete opacification of the left hemithorax with some mediastinal shift to the right indicating a large left pleural effusion. 2. The right lung is grossly clear. Electronically Signed   By: Ivar Drape M.D.   On: 11/20/2017 17:04    ASSESSMENT / PLAN:   Acute Hypoxic Respiratory Failure- multifactorial - due GM:WNUUV Right PE, AECOPD, superimposed on subacute post-obstructive left lung atelectasias in setting of Left Lung non-small cell, squamous cell carcinoma; Further complicated by recurrent left pleural effusion A-fib w/ RVR Acutely hypoxic with worsening work of breathing, multiple reasons for this;  Most acute change is with PE, which happens to affect his functioning lung.  Suspect this  is the biggest contributor to his current symptoms.  He does have a recurrent left pleural effusion   P: -Supplemental O2 to maintain O2 sats 88-94% -Bipap (pt has agreed to Bipap to assist with WOB) -S/p Thoracentesis in ED>> 500 ml removed, Bedside Ultrasound performed to assess for residual     pleural effusion, with insignificant amount noted.  Consider Pleurex  catheter, however not urgent at this time. Continue to assess. -Continue Heparin gtt, rate adjustment per Pharmacy -Consider catheter directed thrombolysis with TPA if work of breathing not improved with Bipap -Cardiology following, appreciate input -Amiodarone gtt per Cardiology -Continue Brovana, Pulmicort, Levalbulterol, & Solu-Medrol -Trend Procalcitonin, if negative consider d/c Vancomycin & Zosyn -F/u CXR in am 6/1 -Palliative care consulted, appreciate input with goals of care     VTE prophylaxis: Heparin gtt SUP: N/a Nutrition: Full Liquid Diet Goals of care: LTD code - No intubation  Darel Hong, AGACNP-BC [place] Pager: [phone]  11/21/2017, 9:50 AM

## 2017-11-21 NOTE — Progress Notes (Signed)
   11/21/17 1500  Clinical Encounter Type  Visited With Patient and family together  Visit Type Initial;Psychological support;Spiritual support  Referral From Nurse  Consult/Referral To Chaplain  Spiritual Encounters  Spiritual Needs Emotional;Other (Comment) (Spiritual Care Conversation/Support)  Stress Factors  Patient Stress Factors None identified  Family Stress Factors None identified   I visited briefly with the patient and his family per [place]. The patient stated that he does not want to speak with a Chaplain at this time.   Please, contact Spiritual Care for further assistance.   Chaplain Shanon Ace M.Div., [facility]

## 2017-11-22 ENCOUNTER — Inpatient Hospital Stay (HOSPITAL_COMMUNITY): Payer: Medicare Other

## 2017-11-22 DIAGNOSIS — Z515 Encounter for palliative care: Secondary | ICD-10-CM

## 2017-11-22 LAB — BASIC METABOLIC PANEL
ANION GAP: 12 (ref 5–15)
BUN: 25 mg/dL — ABNORMAL HIGH (ref 6–20)
CALCIUM: 8.4 mg/dL — AB (ref 8.9–10.3)
CO2: 22 mmol/L (ref 22–32)
Chloride: 100 mmol/L — ABNORMAL LOW (ref 101–111)
Creatinine, Ser: 1.66 mg/dL — ABNORMAL HIGH (ref 0.61–1.24)
GFR calc Af Amer: 44 mL/min — ABNORMAL LOW (ref >60)
GFR calc non Af Amer: 38 mL/min — ABNORMAL LOW (ref >60)
GLUCOSE: 190 mg/dL — AB (ref 65–99)
Potassium: 4 mmol/L (ref 3.5–5.1)
Sodium: 134 mmol/L — ABNORMAL LOW (ref 135–145)

## 2017-11-22 LAB — CBC
HCT: 30.4 % — ABNORMAL LOW (ref 39.0–52.0)
Hemoglobin: 9.9 g/dL — ABNORMAL LOW (ref 13.0–17.0)
MCH: 26.5 pg (ref 26.0–34.0)
MCHC: 32.6 g/dL (ref 30.0–36.0)
MCV: 81.3 fL (ref 78.0–100.0)
PLATELETS: 295 10*3/uL (ref 150–400)
RBC: 3.74 MIL/uL — AB (ref 4.22–5.81)
RDW: 15.6 % — ABNORMAL HIGH (ref 11.5–15.5)
WBC: 27.1 10*3/uL — ABNORMAL HIGH (ref 4.0–10.5)

## 2017-11-22 LAB — HEPARIN LEVEL (UNFRACTIONATED): Heparin Unfractionated: >2.2 IU/mL — ABNORMAL HIGH (ref 0.30–0.70)

## 2017-11-22 LAB — GLUCOSE, CAPILLARY
GLUCOSE-CAPILLARY: 181 mg/dL — AB (ref 65–99)
GLUCOSE-CAPILLARY: 234 mg/dL — AB (ref 65–99)
Glucose-Capillary: 164 mg/dL — ABNORMAL HIGH (ref 65–99)
Glucose-Capillary: 197 mg/dL — ABNORMAL HIGH (ref 65–99)

## 2017-11-22 LAB — MAGNESIUM: Magnesium: 1.7 mg/dL (ref 1.7–2.4)

## 2017-11-22 LAB — PROCALCITONIN: Procalcitonin: 0.4 ng/mL

## 2017-11-22 LAB — APTT
APTT: 66 s — AB (ref 24–36)
APTT: 66 s — AB (ref 24–36)

## 2017-11-22 MED ORDER — HEPARIN (PORCINE) IN NACL 100-0.45 UNIT/ML-% IJ SOLN
1400.0000 [IU]/h | INTRAMUSCULAR | Status: AC
  Administered 2017-11-22 – 2017-11-23 (×2): 1400 [IU]/h via INTRAVENOUS
  Filled 2017-11-22: qty 250

## 2017-11-22 MED ORDER — AMIODARONE HCL 200 MG PO TABS
400.0000 mg | ORAL_TABLET | Freq: Two times a day (BID) | ORAL | Status: DC
Start: 2017-11-22 — End: 2017-12-01
  Administered 2017-11-22 – 2017-12-01 (×18): 400 mg via ORAL
  Filled 2017-11-22 (×18): qty 2

## 2017-11-22 MED ORDER — LORAZEPAM 0.5 MG PO TABS
0.5000 mg | ORAL_TABLET | Freq: Four times a day (QID) | ORAL | Status: DC | PRN
  Administered 2017-11-23 – 2017-11-26 (×6): 0.5 mg via ORAL
  Filled 2017-11-22 (×6): qty 1

## 2017-11-22 MED ORDER — MAGNESIUM SULFATE 2 GM/50ML IV SOLN
2.0000 g | Freq: Once | INTRAVENOUS | Status: AC
  Administered 2017-11-22: 2 g via INTRAVENOUS
  Filled 2017-11-22: qty 50

## 2017-11-22 MED ORDER — HEPARIN (PORCINE) IN NACL 100-0.45 UNIT/ML-% IJ SOLN
1350.0000 [IU]/h | INTRAMUSCULAR | Status: DC
  Administered 2017-11-22: 1350 [IU]/h via INTRAVENOUS
  Filled 2017-11-22: qty 250

## 2017-11-22 MED ORDER — DILTIAZEM HCL 60 MG PO TABS
60.0000 mg | ORAL_TABLET | Freq: Four times a day (QID) | ORAL | Status: DC
  Administered 2017-11-22 – 2017-11-26 (×16): 60 mg via ORAL
  Filled 2017-11-22 (×16): qty 1

## 2017-11-22 NOTE — Consult Note (Signed)
Consultation Note Date: 11/22/2017   Patient Name: Travis Ferguson.  DOB: December 27, 1940  MRN: [id number]  Age / Sex: 77 y.o., male  PCP: Crist Infante, MD Referring Physician: Patrecia Pour, Christean Grief, MD  Reason for Consultation: Establishing goals of care  HPI/Patient Profile: 77 y.o. male  with past medical history of NSCLC receiving XRT (last treatment 5/30), combined CHF, afib, OSA, sarcoidosis, who was admitted on 11/20/2017 with acute on chronic respiratory failure after becoming short of breath during radiation. Workup revealed a large left sided pleural effusion and he is s/p thoracentesis. Patient was also found to have an acute PE and afib with RVR. Patient is being followed by PCCM. He is felt to have a poor prognosis. Palliative care was consulted to help clarify goals of care.   Clinical Assessment and Goals of Care: Patient is currently in ICU on BIPAP. However, he is awake and was able to carry on a conversation with me about goals and his wishes. Patient's wife and son were present at the time of my visit.   Patient was able to verbalize his current medical problems including an understanding by the medical team that his prognosis is poor. He says he does not think that he has any treatment options remaining for the cancer but wants to speak with Dr. Julien Nordmann about this on Monday. He has a lot of respect for Dr. Julien Nordmann and would like his input to help guide the medical plan. Patient asked me about hospice care in the home and we discussed that at length. Patient says he has also decided that he does not want to be resuscitated including CPR or intubation. I will change his code status to DNR per patient/family request.   Attending updated.   SUMMARY OF RECOMMENDATIONS   1. Continue supportive care for now.  2. Recommend oncology consultation  3. Patient would be a candidate for hospice care if ok with  oncology.  4. DNR     Primary Diagnoses: Present on Admission: . Acute respiratory failure with hypoxia ([place]) . Stage IV squamous cell carcinoma of left lung ([place]) . Splenic artery aneurysm ([place]) . Pulmonary sarcoidosis ([place]) . Pleural effusion . Essential hypertension . COPD with acute exacerbation ([place]) . Chronic pain syndrome   I have reviewed the medical record, interviewed the patient and family, and examined the patient. The following aspects are pertinent.  Past Medical History:  Diagnosis Date  . Anxiety   . Arthritis    "about q joint I've got" (09/22/2015)  . Atherosclerosis   . CAP (community acquired pneumonia) 02/24/2015  . Cervical disc disease   . Chronic combined systolic and diastolic CHF (congestive heart failure) ([place])    a. prior EF 20-25% in 2016 ([facility]), improved to 50-55% in 08/2017.  [name] Chronic lower back pain    chronic back pain/under pain management  . Colon polyps 08/02/2014   Tubular adenoma x 3, Hyperplastic-1  . COPD (chronic obstructive pulmonary disease) ([place])    a. Former Airline pilot, also smoked a pipe.  [name]  Depression   . Dyslipidemia   . Dyspnea    chronic  . GERD (gastroesophageal reflux disease)   . Hiatal hernia    sensation problem ("right lateral side hip to knee").  . History of blood transfusion "numerous"  . History of oxygen administration    @ 2 l/m nasally at bedtime.(09/22/2015)  . Hypertension   . Inguinal hernia    right side  . Migraine    "years since I've had one" (09/22/2015)  . Mild CAD    a. nonobstructive CAD by cath 2016.  [name] OSA (obstructive sleep apnea)    does not use CPAP; "just oxygen" (09/22/2015)  . PAF (paroxysmal atrial fibrillation) ([place])   . Paroxysmal atrial flutter ([place])   . PTSD (post-traumatic stress disorder)   . Recurrent pleural effusion on left   . Rhinitis   . Sarcoidosis   . Sensation problem    right side-lateral hip to knee" decreased sensation and tingling feeling" -has informed Dr.  Joylene Draft, Dr. Henrene Pastor, Dr. Lucia Gaskins of this.  . Sepsis ([place])   . Squamous cell lung cancer ([place])   . Steroid-induced hyperglycemia 02/25/2015  . Subdural hematoma ([place]) 11/10   a. 2010 s/p surgery - diagnosed several weeks after a fall.   Social History   Socioeconomic History  . Marital status: Married    Spouse name: Not on file  . Number of children: 2  . Years of education: 46  . Highest education level: Not on file  Occupational History  . Occupation: retired Airline pilot in [employer]  . Financial resource strain: Not on file  . Food insecurity:    Worry: Not on file    Inability: Not on file  . Transportation needs:    Medical: Not on file    Non-medical: Not on file  Tobacco Use  . Smoking status: Former Smoker    Packs/day: 0.00    Years: 30.00    Pack years: 0.00    Types: Pipe    Last attempt to quit: 06/24/2008    Years since quitting: 9.4  . Smokeless tobacco: Never Used  Substance and Sexual Activity  . Alcohol use: No  . Drug use: No  . Sexual activity: Not Currently  Lifestyle  . Physical activity:    Days per week: Not on file    Minutes per session: Not on file  . Stress: Not on file  Relationships  . Social connections:    Talks on phone: Not on file    Gets together: Not on file    Attends religious service: Not on file    Active member of club or organization: Not on file    Attends meetings of clubs or organizations: Not on file    Relationship status: Not on file  Other Topics Concern  . Not on file  Social History Narrative   Drinks about 6-7 caffeine drinks a day    Family History  Problem Relation Age of Onset  . Heart disease Mother   . Heart disease Father   . Colon polyps Brother   . Congestive Heart Failure Brother   . Coronary artery disease Unknown   . Stomach cancer Paternal Grandmother   . Colon cancer Neg Hx   . Pancreatic cancer Neg Hx   . Rectal cancer Neg Hx   . Esophageal cancer Neg Hx    Scheduled Meds: .  amiodarone  400 mg Oral BID  . arformoterol  15 mcg Nebulization BID  .  atorvastatin  20 mg Oral q1800  . budesonide (PULMICORT) nebulizer solution  0.25 mg Nebulization BID  . diltiazem  60 mg Oral Q6H  . DULoxetine  30 mg Oral Daily  . FLUoxetine  40 mg Oral Q breakfast  . guaiFENesin  600 mg Oral BID  . levalbuterol  0.63 mg Nebulization Q6H  . methylPREDNISolone (SOLU-MEDROL) injection  60 mg Intravenous Q6H   Continuous Infusions: . heparin 1,400 Units/hr (11/22/17 1420)  . piperacillin-tazobactam (ZOSYN)  IV Stopped (11/22/17 1506)   PRN Meds:.acetaminophen **OR** acetaminophen, benzonatate, HYDROcodone-acetaminophen, levalbuterol, LORazepam, ondansetron **OR** ondansetron (ZOFRAN) IV Medications Prior to Admission:  Prior to Admission medications   Medication Sig Start Date End Date Taking? Authorizing Provider  albuterol (PROVENTIL HFA;VENTOLIN HFA) 108 (90 Base) MCG/ACT inhaler 1-2 PUFFS EVERY 4-6 HOURS AS NEEDED FOR SHORTNESS OF BREATH 10/20/17  Yes Chesley Mires, MD  amiodarone (PACERONE) 100 MG tablet Take 1 tablet (100 mg total) by mouth daily. 10/22/17  Yes Kilroy, Doreene Burke, PA-C  apixaban (ELIQUIS) 5 MG TABS tablet Take 1 tablet (5 mg total) by mouth 2 (two) times daily. 10/25/17  Yes Patrecia Pour, Christean Grief, MD  atorvastatin (LIPITOR) 20 MG tablet Take 1 tablet (20 mg total) by mouth daily at 6 PM. 09/08/17  Yes Dessa Phi, DO  benzonatate (TESSALON) 200 MG capsule Take 1 capsule (200 mg total) by mouth 3 (three) times daily as needed for cough. 09/22/17  Yes Chesley Mires, MD  budesonide-formoterol (SYMBICORT) 160-4.5 MCG/ACT inhaler Inhale 2 puffs into the lungs 2 (two) times daily. 07/15/17  Yes Parrett, Tammy S, NP  cholecalciferol (VITAMIN D) 1000 units tablet Take 2,000 Units by mouth daily.   Yes [provider]  DULoxetine (CYMBALTA) 30 MG capsule Take 30 mg by mouth daily.   Yes [provider]  ENTRESTO 24-26 MG TAKE 1 TABLET BY MOUTH 2 (TWO) TIMES DAILY.  05/14/16  Yes Lorretta Harp, MD  finasteride (PROSCAR) 5 MG tablet Take 5 mg by mouth daily.  01/07/13  Yes [provider]  FLUoxetine (PROZAC) 40 MG capsule Take 40 mg by mouth daily with breakfast.    Yes [provider]  guaiFENesin (MUCINEX) 600 MG 12 hr tablet Take 1 tablet (600 mg total) by mouth 2 (two) times daily. 10/13/17 10/13/18 Yes Florencia Reasons, MD  HYDROcodone-acetaminophen (NORCO/VICODIN) 5-325 MG tablet Take 1 tablet by mouth every 8 (eight) hours as needed for moderate pain.    Yes [provider]  Loperamide HCl (IMODIUM A-D PO) Take 2 tablets by mouth daily as needed (diarrhea).   Yes [provider]  nystatin (MYCOSTATIN/NYSTOP) powder APPLY TO AFFECTED AREA 3 TIMES A DAY 10/30/17  Yes [provider]  OXYGEN Inhale 2 L/min into the lungs at bedtime.   Yes [provider]  Probiotic Product (PROBIOTIC PO) Take 1 tablet by mouth daily.   Yes [provider]  prochlorperazine (COMPAZINE) 10 MG tablet Take 1 tablet (10 mg total) by mouth every 6 (six) hours as needed for nausea or vomiting. 11/04/17  Yes Curt Bears, MD  sulfamethoxazole-trimethoprim (BACTRIM DS,SEPTRA DS) 800-160 MG tablet Take 1 tablet by mouth 2 (two) times daily. 11/18/17  Yes Alla Feeling, NP  furosemide (LASIX) 20 MG tablet Take 1 tablet (20 mg total) by mouth 2 (two) times daily. Patient not taking: Reported on 11/20/2017 09/08/17   Dessa Phi, DO  ipratropium (ATROVENT) 0.02 % nebulizer solution Take 2.5 mLs (0.5 mg total) by nebulization 4 (four) times daily. Patient not  taking: Reported on 11/20/2017 07/15/17   Parrett, Fonnie Mu, NP  levalbuterol (XOPENEX) 1.25 MG/0.5ML nebulizer solution Take 1.25 mg by nebulization every 8 (eight) hours as needed for wheezing or shortness of breath. Patient not taking: Reported on 11/20/2017 10/13/17   Florencia Reasons, MD  metoprolol tartrate (LOPRESSOR) 25 MG tablet TAKE 1 TABLET (25 MG TOTAL) BY MOUTH 2 (TWO) TIMES  DAILY. Patient not taking: Reported on 11/20/2017 09/11/15   Lorretta Harp, MD   Allergies  Allergen Reactions  . Statins Other (See Comments)    Muscle aches - tolerating Vytorin   Review of Systems  Respiratory: Positive for shortness of breath.     Physical Exam  Constitutional: He is oriented to person, place, and time.  Ill appearing on BIPAP  Cardiovascular: Normal rate.  Pulmonary/Chest: No accessory muscle usage. No tachypnea.  Poor air movement, coarse BSs ant fields  Abdominal: Soft. Bowel sounds are normal.  Neurological: He is alert and oriented to person, place, and time.  Skin: Skin is warm and dry.    Vital Signs: BP 133/77   Pulse (!) 106   Temp 98 F (36.7 C) (Oral)   Resp (!) 25   Ht 5\' 11"  (1.803 m)   Wt 112.3 kg (247 lb 9.2 oz)   SpO2 99%   BMI 34.53 kg/m  Pain Scale: 0-10   Pain Score: 0-No pain   SpO2: SpO2: 99 % O2 Device:SpO2: 99 % O2 Flow Rate: .O2 Flow Rate (L/min): 2 L/min  IO: Intake/output summary:   Intake/Output Summary (Last 24 hours) at 11/22/2017 1618 Last data filed at 11/22/2017 1300 Gross per 24 hour  Intake 2084.92 ml  Output 1430 ml  Net 654.92 ml    LBM: Last BM Date: 11/20/17 Baseline Weight: Weight: 110.7 kg (244 lb 0.8 oz) Most recent weight: Weight: 112.3 kg (247 lb 9.2 oz)     Palliative Assessment/Data:   Flowsheet Rows     Most Recent Value  Intake Tab  Referral Department  Hospitalist  Unit at Time of Referral  ICU  Date Notified  11/22/17  Palliative Care Type  New Palliative care  Reason for referral  Clarify Goals of Care  Date of Admission  11/20/17  Date first seen by Palliative Care  11/22/17  # of days Palliative referral response time  0 Day(s)  # of days IP prior to Palliative referral  2  Clinical Assessment  Psychosocial & Spiritual Assessment  Palliative Care Outcomes  Patient/Family meeting held?  Yes  Who was at the meeting?  patient, wife, son  Palliative Care Outcomes  Clarified  goals of care      Time In: 4967 Time Out: 1630 Time Total: 45 minutes Greater than 50%  of this time was spent counseling and coordinating care related to the above assessment and plan.  Signed by: Irean Hong, NP   Please contact Palliative Medicine Team phone at [phone] for questions and concerns.  For individual provider: See Shea Evans

## 2017-11-22 NOTE — [facility] Notes (Signed)
ANTICOAGULATION CONSULT NOTE   Pharmacy Consult for heparin Indication:hx  atrial fibrillation; suspects new acute PE (home Eliquis on hold)  Allergies  Allergen Reactions  . Statins Other (See Comments)    Muscle aches - tolerating Vytorin    Patient Measurements: Height: 5\' 11"  (180.3 cm) Weight: 247 lb 9.2 oz (112.3 kg) IBW/kg (Calculated) : 75.3 Heparin Dosing Weight: 99 kg  Vital Signs: Temp: 97.8 F (36.6 C) (06/01 0800) Temp Source: Oral (06/01 0800) BP: 148/76 (06/01 0900) Pulse Rate: 101 (06/01 0900)  Labs: Recent Labs    11/20/17 1612  11/20/17 2028 11/20/17 2317 11/21/17 0727 11/21/17 1043 11/21/17 1904 11/22/17 0316  HGB 11.9*  --   --   --  10.6*  --   --  9.9*  HCT 35.6*  --   --   --  32.7*  --   --  30.4*  PLT 371  --   --   --  298  --   --  295  APTT  --    < > 33  --  134*  --  71* 66*  HEPARINUNFRC  --   --  >2.20*  --  >2.20*  --   --  >2.20*  CREATININE 1.78*  --   --   --  1.50*  --   --  1.66*  TROPONINI  --   --   --  0.03* <0.03 0.03*  --   --    < > = values in this interval not displayed.    Estimated Creatinine Clearance: 47.5 mL/min (A) (by C-G formula based on SCr of 1.66 mg/dL (H)).   Medications:  - Eliquis 5 mg bid PTA (last dose taken on 5/30 at 11a)  Assessment: Patient's a 77 y.o M with hx lung cancer and afib on Eliquis PTA, presented to the ED on 5/30 with c/o SOB and back pain.  Chest CTA on 5/30 showed moderate L pleural effusion (thoracentesis performed in the ED at ~6p) and findings with suspicion for small RLL PE.  Eliquis placed on hold and patient was started on heparin drip for afib and PE.  Today, 11/22/2017: - aPTT remains therapeutic at 66 sec, but is at lower end of goal range - hgb down slightly 9.9, plts ok - No bleeding documented - scr labile, up 1.66 today  Goal of Therapy:  Heparin level 0.3-0.7 units/ml aPTT 66-102 seconds Monitor platelets by anticoagulation protocol: Yes   Plan:  - Increase  heparin drip to 1400 units/hr - f/u with labs in AM and adjust as if needed - monitor for s/s bleeding   Teagan Ozawa P 11/22/2017,9:21 AM

## 2017-11-22 NOTE — [facility] Notes (Signed)
ANTICOAGULATION CONSULT NOTE   Pharmacy Consult for heparin Indication:hx  atrial fibrillation; suspects new acute PE (home Eliquis on hold)  Allergies  Allergen Reactions  . Statins Other (See Comments)    Muscle aches - tolerating Vytorin    Patient Measurements: Height: 5\' 11"  (180.3 cm) Weight: 248 lb 7.3 oz (112.7 kg) IBW/kg (Calculated) : 75.3 Heparin Dosing Weight: 99 kg  Vital Signs: Temp: 98 F (36.7 C) (05/31 2316) Temp Source: Axillary (05/31 2316) BP: 133/83 (06/01 0300) Pulse Rate: 97 (06/01 0300)  Labs: Recent Labs    11/20/17 1612  11/20/17 2028 11/20/17 2317 11/21/17 0727 11/21/17 1043 11/21/17 1904 11/22/17 0316  HGB 11.9*  --   --   --  10.6*  --   --  9.9*  HCT 35.6*  --   --   --  32.7*  --   --  30.4*  PLT 371  --   --   --  298  --   --  295  APTT  --    < > 33  --  134*  --  71* 66*  HEPARINUNFRC  --   --  >2.20*  --  >2.20*  --   --   --   CREATININE 1.78*  --   --   --  1.50*  --   --  1.66*  TROPONINI  --   --   --  0.03* <0.03 0.03*  --   --    < > = values in this interval not displayed.    Estimated Creatinine Clearance: 47.6 mL/min (A) (by C-G formula based on SCr of 1.66 mg/dL (H)).   Medications:  - Eliquis 5 mg bid PTA (last dose taken on 5/30 at 11a)  Assessment: Patient's a 77 y.o M with hx lung cancer and afib on Eliquis PTA, presented to the ED on 5/30 with c/o SOB and back pain.  Chest CTA on 5/30 showed moderate L pleural effusion (thoracentesis performed in the ED at ~6p) and findings with suspicion for small RLL PE.  Eliquis placed on hold and patient was started on heparin drip for a-fib and PE.  5/31 - 1904 aPTT = 71 seconds, now therapeutic after heparin infusion rate decreased to 1300 units/hr  - Hgb down slightly 10.6, Pltc WNL - no bleeding or infusion issues noted per nursing Today, 6/1 -0316 aptt=66 sec and HL= >2.20, aPtt at low end of goal, no infusion or bleeding issues per RN.   Goal of Therapy:  Heparin  level 0.3-0.7 units/ml aPTT 66-102 seconds Monitor platelets by anticoagulation protocol: Yes   Plan:  -Increase Heparin drip slightly to 1350 units/hr - Check aPTT and heparin level in 8 hours. Will adjust heparin rate based on aPTT for now until both aPTT and heparin level correlate d/t residual effect of Eliquis - Daily CBC, heparin level - Monitor closely for s/sx of bleeding    Dorrene German 11/22/2017 4:33 AM

## 2017-11-22 NOTE — Progress Notes (Addendum)
Name: Travis Ferguson. MRN: [id number] DOB: 05/15/1941    ADMISSION DATE:  11/20/2017 CONSULTATION DATE:  11/21/17  REFERRING MD :  Dr. Hal Hope  CHIEF COMPLAINT:  Shortness of breath  BRIEF PATIENT DESCRIPTION:  Travis Ferguson is a 77 y.o. Male who presented to [facility] with c/o Shortness of breath.  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  He reports SOB that began 4-5 days ago, that has progressively worsened.  He received radiation on 5/30 in the cancer clinic, then developed worsening dyspnea post radiation, prompting him to seek care at his Oncologist's office on 5/30.  He was referred to the ED due to the severity of his dyspnea, diaphoresis, and hypoxia.    In the ED initial workup, CXR was concerning for near complete opacification of left lung with mediastinal shift to right indicative of large left pleural effusion, of which a thoracentesis was performed in ED draining 500 ml. CT Chest 5/30 revealed RLL Pulmonary Embolism (small), and near obstruction of left mainstem bronchus with near complete Atelectasis of the Left lung.   Pt was placed on supplemental O2, Heparin gtt, Amiodarone gtt.  On 5/31, pt with increased work of breathing, necessitating Bipap.   PCCM is consulted for further evaluation/management of Acute Respiratory Failure.   ANTIBIOTICS: 5/30>> Cefepime x1 dose 5/30 Vancomycin>> 5/31 Zosyn>>  STUDIES:  CT Chest 5/30>> Suspect small pulmonary emboli within the RIGHT LOWER lobe pulmonary artery branches. Near complete obstruction of the LEFT mainstem bronchus. This results in near complete atelectasis of the LEFT UPPER and LOWER lobes. Persistent moderate LEFT pleural effusion. Extrinsic compression and resulting effacement of the LEFT pulmonary arteries. Large mediastinal and LEFT hilar mass, stable in appearance.   SIGNIFICANT EVENTS    5/30>>Admitted to [facility] for Acute Hypoxic Respiratory Failure after Radiation tx 5/31>> PCCM consulted Pt states he still feels SOB, experienced brief improvement with SOB after Thoracentesis in ED. Feels like his chest is "constricted" On 3L [place]    SUBJECTIVE/OVERNIGHT/INTERVAL HX 11/22/2017 - cards changin IV to po amio and cardizem. On IV heparin gtt. Needed bipap ona nd off through the night and day time. Per APP Travis Ferguson -> bedside US  Yesterday did not show much left pleural effusion. AT this point patient just wants to eat. Reports class 4 dyspnea  VITAL SIGNS: Temp:  [97.7 F (36.5 C)-98 F (36.7 C)] 97.8 F (36.6 C) (06/01 0800) Pulse Rate:  [77-153] 103 (06/01 0815) Resp:  [16-29] 24 (06/01 0815) BP: (102-162)/(58-92) 141/79 (06/01 0800) SpO2:  [93 %-100 %] 98 % (06/01 0815) FiO2 (%):  [30 %] 30 % (06/01 0122) Weight:  [112.3 kg (247 lb 9.2 oz)] 112.3 kg (247 lb 9.2 oz) (06/01 0500)  PHYSICAL EXAMINATION:  General Appearance:    Looks criticall ill OBESE - +  Head:    Normocephalic, without obvious abnormality, atraumatic  Eyes:    PERRL - yes, conjunctiva/corneas - clear      Ears:    Normal external ear canals, both ears  Nose:   NG tube - no btu has full face bipap mask  Throat:  ETT TUBE - no , OG tube - no  Neck:   Supple,  No enlargement/tenderness/nodules     Lungs:     Left air entry dimnished. Rt side wheeze +  Chest wall:    No deformity  Heart:    S1  and S2 normal, no murmur, CVP - no.  Pressors - no  Abdomen:     Soft, no masses, no organomegaly  Genitalia:    Not done  Rectal:   not done  Extremities:   Extremities- intact     Skin:   Intact in exposed areas .     Neurologic:   Sedation - none -> RASS - +1 . Moves all 4s - yes. CAM-ICU - neg . Orientation - x3+     PULMONARY No results for input(s): PHART, PCO2ART, PO2ART, HCO3, TCO2, O2SAT in the last 168 hours.  Invalid input(s): PCO2, PO2  CBC Recent Labs  Lab 11/20/17 1612  11/21/17 0727 11/22/17 0316  HGB 11.9* 10.6* 9.9*  HCT 35.6* 32.7* 30.4*  WBC 34.0* 29.0* 27.1*  PLT 371 298 295    COAGULATION No results for input(s): INR in the last 168 hours.  CARDIAC   Recent Labs  Lab 11/20/17 2317 11/21/17 0727 11/21/17 1043  TROPONINI 0.03* <0.03 0.03*   No results for input(s): PROBNP in the last 168 hours.   CHEMISTRY Recent Labs  Lab 11/18/17 0934 11/20/17 1612 11/20/17 2317 11/21/17 0727 11/22/17 0316  NA 133* 138  --  136 134*  K 3.9 3.5  --  4.3 4.0  CL 99 101  --  103 100*  CO2 21* 24  --  24 22  GLUCOSE 132 161*  --  164* 190*  BUN 23 22*  --  23* 25*  CREATININE 1.33* 1.78*  --  1.50* 1.66*  CALCIUM 9.1 8.7*  --  8.1* 8.4*  MG  --   --  1.3*  --  1.7   Estimated Creatinine Clearance: 47.5 mL/min (A) (by C-G formula based on SCr of 1.66 mg/dL (H)).   LIVER Recent Labs  Lab 11/18/17 0934 11/20/17 1612 11/21/17 0727  AST 18 20 17   ALT 15 15* 14*  ALKPHOS 86 100 86  BILITOT 0.5 0.4 0.6  PROT 6.0* 6.3* 5.5*  ALBUMIN 2.6* 2.9* 2.6*     INFECTIOUS Recent Labs  Lab 11/20/17 1656 11/20/17 1934 11/21/17 1043 11/22/17 0316  LATICACIDVEN 2.61* 1.14  --   --   PROCALCITON  --   --  0.46 0.40     ENDOCRINE CBG (last 3)  Recent Labs    11/22/17 0035 11/22/17 0809  GLUCAP 234* 181*         IMAGING x48h  - image(s) personally visualized  -   highlighted in bold Ct Angio Chest Pe W And/or Wo Contrast  Result Date: 11/20/2017 CLINICAL DATA:  Brought from cancer center post radiation treatment, Pt presents with dyspnea, diaphoretic, hypertensive and reported hypoxia, sp02 low 80s, placed on 6lts at center. Sp02 on arrival 99 on 2ltr nasal canula. History of stage IV lung cancer. EXAM: CT ANGIOGRAPHY CHEST WITH CONTRAST TECHNIQUE: Multidetector CT imaging of the chest was performed using the standard protocol during bolus administration of intravenous contrast. Multiplanar CT image reconstructions and MIPs were  obtained to evaluate the vascular anatomy. CONTRAST:  32mL ISOVUE-370 IOPAMIDOL (ISOVUE-370) INJECTION 76% COMPARISON:  11/11/2017, 10/23/2017 FINDINGS: Cardiovascular: There is significant artifact from numerous calcified mediastinal and hilar lymph nodes. The pulmonary arteries are well opacified. There are filling defects within small RIGHT LOWER lobe pulmonary arteries consistent with pulmonary embolus. There is marked narrowing of the LEFT pulmonary artery secondary to mediastinal and hilar mass. There is obstruction of the LEFT pulmonary veins, stable in appearance. Coronary artery calcifications are present. Mediastinum/Nodes: Stable,  large soft tissue mass involving the LEFT hilum and mediastinum. Lungs/Pleura: There is near complete obstruction of the LEFT mainstem bronchus. There is significant effacement of the RIGHT mainstem bronchus. There is near complete opacification of the LEFT lung with significantly less aerated lung in the UPPER lobe compared to prior studies. Findings are consistent with a combination of tumor and atelectasis. There are small amounts of pleural air anteriorly on the LEFT following thoracentesis earlier today. Moderate pleural effusion persists. Upper Abdomen: Cholecystectomy. Musculoskeletal: Degenerative changes in the thoracic spine. No suspicious lytic or blastic lesions. Review of the MIP images confirms the above findings. IMPRESSION: 1. Suspect small pulmonary emboli within the RIGHT LOWER lobe pulmonary artery branches. 2. Near complete obstruction of the LEFT mainstem bronchus. This results in near complete atelectasis of the LEFT UPPER and LOWER lobes. 3. Persistent moderate LEFT pleural effusion. 4. Extrinsic compression and resulting effacement of the LEFT pulmonary arteries. 5. Large mediastinal and LEFT hilar mass, stable in appearance. These results were called by telephone at the time of interpretation on 11/20/2017 at 6:43 pm to Dr. Shirlyn Goltz , who verbally  acknowledged these results. Electronically Signed   By: Nolon Nations M.D.   On: 11/20/2017 18:46   Dg Chest Port 1 View  Result Date: 11/22/2017 CLINICAL DATA:  Acute respiratory failure.  Followup exam. EXAM: PORTABLE CHEST 1 VIEW COMPARISON:  11/21/2017 and older exams. FINDINGS: Complete opacification of the left hemithorax is unchanged from the most recent prior exam. Right lung is clear. No pneumothorax. IMPRESSION: 1. No change from the most recent prior exam. Persistent complete opacification of the left hemithorax. Right lung remains clear. Electronically Signed   By: Lajean Manes M.D.   On: 11/22/2017 07:35   Dg Chest Port 1 View  Result Date: 11/21/2017 CLINICAL DATA:  Evaluate pleural effusion EXAM: PORTABLE CHEST 1 VIEW COMPARISON:  11/20/2017 FINDINGS: Complete whiteout of the left hemithorax again noted, stable. No confluent opacity on the right. Improved lung volumes on the right. Right hilar fullness is likely vascular when compared to prior chest CT. IMPRESSION: Continued complete opacification of the left hemithorax. Electronically Signed   By: Rolm Baptise M.D.   On: 11/21/2017 09:18   Dg Chest Port 1 View  Result Date: 11/20/2017 CLINICAL DATA:  Post thoracentesis. EXAM: PORTABLE CHEST 1 VIEW COMPARISON:  Chest x-ray Nov 20, 2017 FINDINGS: Complete opacification left hemithorax remains. No definitive pneumothorax. The right lung remains clear. No other changes. IMPRESSION: Continued complete opacification of the left hemithorax without convincing evidence of pneumothorax. Electronically Signed   By: Dorise Bullion III M.D   On: 11/20/2017 18:45   Dg Chest Port 1 View  Result Date: 11/20/2017 CLINICAL DATA:  History of lung carcinoma EXAM: PORTABLE CHEST 1 VIEW COMPARISON:  PET-CT of 11/11/2017 and chest x-ray of 10/24/2017 FINDINGS: There is now near complete opacification of the left hemithorax with slight mediastinal shift to the right. This likely suggests a large right  pleural effusion. Heart size is difficult to assess. The right lung is clear. Reverse left shoulder arthroplasty is noted. IMPRESSION: 1. Near complete opacification of the left hemithorax with some mediastinal shift to the right indicating a large left pleural effusion. 2. The right lung is grossly clear. Electronically Signed   By: Ivar Drape M.D.   On: 11/20/2017 17:04      ASSESSMENT / PLAN:   Acute Hypoxic Respiratory Failure- multifactorial - due IZ:TIWPY Right PE, AECOPD, superimposed on subacute post-obstructive left lung atelectasias in  setting of Left Lung non-small cell, squamous cell carcinoma; Further complicated by recurrent left pleural effusion A-fib w/ RVR   11/22/17 =- Acute hypoxemic resp failure wit BiPAP dependence but stable BP continues unchagned. RLL PE - small anatomically - but PESI score Class 5 due to degree of hypoxemia and male and age and cancer diagnosis. Highest 30d mortality  P: - Conitue o2 - continue bipap for comfort and resp ditsress - Hold off PleurX given [place] by APP yesterday without much fluid + trapped lung likely but might have to reconsider it   - continue IV heparin  -do not recommend lysis for PE given small anatomic clot burden + poor prognosis anyways with underlying stage 4 cancer +  --Continue Brovana, Pulmicort, Levalbulterol, & Solu-Medrol -Cotninue abx for now - Palliative for goals of care   VTE prophylaxis: Heparin gtt SUP: N/a Nutrition: regular diet given poor prognosis Goals of care: LTD code - No intubation  GLOBAL - I think his prognosis here is near terminal     Dr. Brand Males, M.D., [facility].C.P Pulmonary and Critical Care Medicine Staff Physician, [place] Director - Interstitial Lung Disease  Program  Pulmonary [place] at [place], [place], [address]  Pager: [phone], If no answer or between  15:00h - 7:00h: call [phone] Telephone: [phone]

## 2017-11-22 NOTE — Progress Notes (Signed)
Progress Note  Patient Name: Travis Ferguson. Date of Encounter: 11/22/2017  Primary Cardiologist: Quay Burow, MD   Subjective   Back to NSR/ST. getting nebulizer treatment currently.  Shortness of breath at baseline.  No chest pain.  Inpatient Medications    Scheduled Meds: . arformoterol  15 mcg Nebulization BID  . atorvastatin  20 mg Oral q1800  . budesonide (PULMICORT) nebulizer solution  0.25 mg Nebulization BID  . DULoxetine  30 mg Oral Daily  . FLUoxetine  40 mg Oral Q breakfast  . furosemide  40 mg Intravenous BID  . guaiFENesin  600 mg Oral BID  . levalbuterol  0.63 mg Nebulization Q6H  . methylPREDNISolone (SOLU-MEDROL) injection  60 mg Intravenous Q6H   Continuous Infusions: . amiodarone 30 mg/hr (11/21/17 2026)  . diltiazem (CARDIZEM) infusion 10 mg/hr (11/22/17 0515)  . heparin 1,350 Units/hr (11/22/17 0514)  . piperacillin-tazobactam (ZOSYN)  IV 3.375 g (11/22/17 0333)  . vancomycin 1,250 mg (11/22/17 0513)   PRN Meds: acetaminophen **OR** acetaminophen, benzonatate, HYDROcodone-acetaminophen, levalbuterol, ondansetron **OR** ondansetron (ZOFRAN) IV   Vital Signs    Vitals:   11/22/17 0300 11/22/17 0500 11/22/17 0600 11/22/17 0755  BP: 133/83 (!) 141/78 140/74   Pulse: 97 95 94   Resp: (!) 21 (!) 26 (!) 22   Temp:      TempSrc:      SpO2: 98% 97% 97% 94%  Weight:  247 lb 9.2 oz (112.3 kg)    Height:        Intake/Output Summary (Last 24 hours) at 11/22/2017 0758 Last data filed at 11/22/2017 3007 Gross per 24 hour  Intake 2414.92 ml  Output 1480 ml  Net 934.92 ml   Filed Weights   11/21/17 0000 11/21/17 0500 11/22/17 0500  Weight: 244 lb 0.8 oz (110.7 kg) 248 lb 7.3 oz (112.7 kg) 247 lb 9.2 oz (112.3 kg)    Telemetry    Sinus tachycardia/sinus rhythm noted on telemetry, P waves noted- Personally Reviewed  ECG    Prior ECG shows atrial fibrillation- Personally Reviewed  Physical Exam   GEN:  Ill-appearing Neck: No JVD Cardiac:   Regular rate and rhythm/mildly tachycardic, no murmurs, rubs, or gallops.  Respiratory:  Diminished breath sounds bilaterally GI: Soft, nontender, non-distended  MS: No edema; No deformity. Neuro:  Nonfocal  Psych: Normal affect   Labs    Chemistry Recent Labs  Lab 11/18/17 0934 11/20/17 1612 11/21/17 0727 11/22/17 0316  NA 133* 138 136 134*  K 3.9 3.5 4.3 4.0  CL 99 101 103 100*  CO2 21* 24 24 22   GLUCOSE 132 161* 164* 190*  BUN 23 22* 23* 25*  CREATININE 1.33* 1.78* 1.50* 1.66*  CALCIUM 9.1 8.7* 8.1* 8.4*  PROT 6.0* 6.3* 5.5*  --   ALBUMIN 2.6* 2.9* 2.6*  --   AST 18 20 17   --   ALT 15 15* 14*  --   ALKPHOS 86 100 86  --   BILITOT 0.5 0.4 0.6  --   GFRNONAA 50* 35* 43* 38*  GFRAA 58* 41* 50* 44*  ANIONGAP 13* 13 9 12      Hematology Recent Labs  Lab 11/20/17 1612 11/21/17 0727 11/22/17 0316  WBC 34.0* 29.0* 27.1*  RBC 4.38 4.00* 3.74*  HGB 11.9* 10.6* 9.9*  HCT 35.6* 32.7* 30.4*  MCV 81.3 81.8 81.3  MCH 27.2 26.5 26.5  MCHC 33.4 32.4 32.6  RDW 15.0 15.3 15.6*  PLT 371 298 295    Cardiac  Enzymes Recent Labs  Lab 11/20/17 2317 11/21/17 0727 11/21/17 1043  TROPONINI 0.03* <0.03 0.03*    Recent Labs  Lab 11/20/17 1653  TROPIPOC 0.04     BNP Recent Labs  Lab 11/20/17 1620  BNP 445.0*     DDimer No results for input(s): DDIMER in the last 168 hours.   Radiology    Ct Angio Chest Pe W And/or Wo Contrast  Result Date: 11/20/2017 CLINICAL DATA:  Brought from cancer center post radiation treatment, Pt presents with dyspnea, diaphoretic, hypertensive and reported hypoxia, sp02 low 80s, placed on 6lts at center. Sp02 on arrival 99 on 2ltr nasal canula. History of stage IV lung cancer. EXAM: CT ANGIOGRAPHY CHEST WITH CONTRAST TECHNIQUE: Multidetector CT imaging of the chest was performed using the standard protocol during bolus administration of intravenous contrast. Multiplanar CT image reconstructions and MIPs were obtained to evaluate the vascular  anatomy. CONTRAST:  81mL ISOVUE-370 IOPAMIDOL (ISOVUE-370) INJECTION 76% COMPARISON:  11/11/2017, 10/23/2017 FINDINGS: Cardiovascular: There is significant artifact from numerous calcified mediastinal and hilar lymph nodes. The pulmonary arteries are well opacified. There are filling defects within small RIGHT LOWER lobe pulmonary arteries consistent with pulmonary embolus. There is marked narrowing of the LEFT pulmonary artery secondary to mediastinal and hilar mass. There is obstruction of the LEFT pulmonary veins, stable in appearance. Coronary artery calcifications are present. Mediastinum/Nodes: Stable, large soft tissue mass involving the LEFT hilum and mediastinum. Lungs/Pleura: There is near complete obstruction of the LEFT mainstem bronchus. There is significant effacement of the RIGHT mainstem bronchus. There is near complete opacification of the LEFT lung with significantly less aerated lung in the UPPER lobe compared to prior studies. Findings are consistent with a combination of tumor and atelectasis. There are small amounts of pleural air anteriorly on the LEFT following thoracentesis earlier today. Moderate pleural effusion persists. Upper Abdomen: Cholecystectomy. Musculoskeletal: Degenerative changes in the thoracic spine. No suspicious lytic or blastic lesions. Review of the MIP images confirms the above findings. IMPRESSION: 1. Suspect small pulmonary emboli within the RIGHT LOWER lobe pulmonary artery branches. 2. Near complete obstruction of the LEFT mainstem bronchus. This results in near complete atelectasis of the LEFT UPPER and LOWER lobes. 3. Persistent moderate LEFT pleural effusion. 4. Extrinsic compression and resulting effacement of the LEFT pulmonary arteries. 5. Large mediastinal and LEFT hilar mass, stable in appearance. These results were called by telephone at the time of interpretation on 11/20/2017 at 6:43 pm to Dr. Shirlyn Goltz , who verbally acknowledged these results.  Electronically Signed   By: Nolon Nations M.D.   On: 11/20/2017 18:46   Dg Chest Port 1 View  Result Date: 11/22/2017 CLINICAL DATA:  Acute respiratory failure.  Followup exam. EXAM: PORTABLE CHEST 1 VIEW COMPARISON:  11/21/2017 and older exams. FINDINGS: Complete opacification of the left hemithorax is unchanged from the most recent prior exam. Right lung is clear. No pneumothorax. IMPRESSION: 1. No change from the most recent prior exam. Persistent complete opacification of the left hemithorax. Right lung remains clear. Electronically Signed   By: Lajean Manes M.D.   On: 11/22/2017 07:35   Dg Chest Port 1 View  Result Date: 11/21/2017 CLINICAL DATA:  Evaluate pleural effusion EXAM: PORTABLE CHEST 1 VIEW COMPARISON:  11/20/2017 FINDINGS: Complete whiteout of the left hemithorax again noted, stable. No confluent opacity on the right. Improved lung volumes on the right. Right hilar fullness is likely vascular when compared to prior chest CT. IMPRESSION: Continued complete opacification of the left hemithorax. Electronically  Signed   By: Rolm Baptise M.D.   On: 11/21/2017 09:18   Dg Chest Port 1 View  Result Date: 11/20/2017 CLINICAL DATA:  Post thoracentesis. EXAM: PORTABLE CHEST 1 VIEW COMPARISON:  Chest x-ray Nov 20, 2017 FINDINGS: Complete opacification left hemithorax remains. No definitive pneumothorax. The right lung remains clear. No other changes. IMPRESSION: Continued complete opacification of the left hemithorax without convincing evidence of pneumothorax. Electronically Signed   By: Dorise Bullion III M.D   On: 11/20/2017 18:45   Dg Chest Port 1 View  Result Date: 11/20/2017 CLINICAL DATA:  History of lung carcinoma EXAM: PORTABLE CHEST 1 VIEW COMPARISON:  PET-CT of 11/11/2017 and chest x-ray of 10/24/2017 FINDINGS: There is now near complete opacification of the left hemithorax with slight mediastinal shift to the right. This likely suggests a large right pleural effusion. Heart size  is difficult to assess. The right lung is clear. Reverse left shoulder arthroplasty is noted. IMPRESSION: 1. Near complete opacification of the left hemithorax with some mediastinal shift to the right indicating a large left pleural effusion. 2. The right lung is grossly clear. Electronically Signed   By: Ivar Drape M.D.   On: 11/20/2017 17:04    Cardiac Studies   EF now low normal.  Patient Profile     77 y.o. male metastatic lung cancer with atrial fibrillation paroxysmal rapid ventricular response.  Assessment & Plan    Paroxysmal atrial fibrillation/flutter -He has converted back to sinus rhythm on IV amiodarone and IV diltiazem.  EF 55%, minimal CAD on heart cath in 2016 in the setting of severely reduced EF of 20 to 25% at that time, atrial flutter, respiratory failure on the left lower no pneumonia.    Eliquis stopped on his own because he had lung cancer.  Eliquis remains on hold in case of invasive procedures.  Currently on IV heparin.  Trying to maintain magnesium greater than 2 potassium greater than 4.  Try to focus on rate control.  Cardizem drip.  I will stop IV Lasix.  Creatinine and BUN are increasing.  Palliation.   -I will change him to p.o. amiodarone 400 mg twice a day and p.o. diltiazem 60 mg p.o. every 6 hours.  After 5 days of amiodarone 400 mg twice a day, decrease to 200 mg twice a day for 2 weeks and then 200 mg a day thereafter. -If atrial fibrillation returns with rapid ventricular response, consider reinitiation of IV amiodarone for further rapid loading.  Metastatic lung cancer -Notes reviewed.  We will sign off.  Please let us know we can be of further assistance.  For questions or updates, please contact [place] Please consult [email or web address] for contact info under Cardiology/STEMI.      Signed, Candee Furbish, MD  11/22/2017, 7:58 AM

## 2017-11-22 NOTE — Progress Notes (Signed)
Entered room to check bipap and pt. Was off bipap and on [place].  Kristen, RN removed patient from bipap.  Saturation 95% at this time.

## 2017-11-22 NOTE — Progress Notes (Signed)
PROGRESS NOTE [facility]   [name].   GUR:[id number] DOB: [birth year]  DOA: 11/20/2017 PCP: Crist Infante, MD   Brief Narrative:  Travis Ferguson. is a 77 year old male with medical history significant for NSCLC recently started on chemo and radiation, COPD, OSA, sarcoidosis and chronic pain syndrome presented to the emergency department complaining of worsening of shortness of breath.  On ED evaluation patient was found to have a large pleural effusion, EDP performed thoracentesis at bedside.  Patient was also found to be in A. fib with RVR along with hypotension.  CTA chest was done which shows small pulmonary embolism on the right, with left pleural effusion and near total obstruction of the left main bronchus.  Patient was admitted with working diagnosis of acute on chronic respiratory failure due to malignant pleural effusion complicated with A. fib with RVR.  Cardiology and PCCM has been consulted.  Subjective: Patient seen and examined, continues to complain of dyspnea.  Heart rate better controlled.  Cardiology changed IV amiodarone Cardizem to p.o. formulations.  Remains on heparin drip.  Assessment & Plan: Acute on chronic respiratory failure with hypoxia Multifactorial from pleural effusion, Afib with RVR and ? Post obstructive PNA with severe leukocytosis pulmonary embolism and COPD.  Continue Solumedrol and nebulizer treatment. Continue O2 supplementation to keep O2 sat > 91%.   Recurrent malignant pleural effusion  Recently diagnosed with NSCLC S/p Thoracentesis in the ED yield 500 cc  Started radio and chemo this month, follow by Dr Earlie Server  Patient will need a Pleurx, but holding for now given bedside ultrasound with not much fluid and ?  Trapped lung.  Will continue antibiotic treatment for now. Palliative care has been consulted.  Pulmonary embolism Continue heparin drip, did well with BiPAP, now on nasal cannula. PCCM not recommending lysis at this  time given small anatomic clot burden plus poor prognosis Continue to monitor closely.  A fib with RVR  In setting of compressive mass  Continue Amio, follow up cardiology recommendations, adding Cardizem drip  ? Post obstructive PNA  Severe leukocytosis  Continue vancomycin and Zosyn Procalcitonin not a good indicator on patients receiving active cancer treatment.  Will follow clinically  AKI  Likely prerenal, from hypotension Initial creatinine improvement, however slight worsening today likely due to receiving Lasix. Stop IV Lasix, check renal function in a.m.  Combine CHF  Patient was given Lasix by cardiology yesterday, this has been stopped today I do not see any signs of fluid overload.  Continue to monitor.  COPD/OSA Continue CPAP  See above   NSCLC  Started radio and chemo this month, follow by Dr Earlie Server, poor prognosis  Palliative care  DVT prophylaxis: Heparin Sq Code Status: Partial Code (DNI) Family Communication: None at bedside  Disposition Plan: To be determine   Consultants:   Cardiology  PCCM  Procedures:   None  Antimicrobials: Anti-infectives (From admission, onward)   Start     Dose/Rate Route Frequency Ordered Stop   11/22/17 0500  vancomycin (VANCOCIN) 1,250 mg in sodium chloride 0.9 % 250 mL IVPB  Status:  Discontinued     1,250 mg 166.7 mL/hr over 90 Minutes Intravenous Every 24 hours 11/21/17 0911 11/22/17 0919   11/21/17 0600  vancomycin (VANCOCIN) 1,500 mg in sodium chloride 0.9 % 500 mL IVPB  Status:  Discontinued     1,500 mg 250 mL/hr over 120 Minutes Intravenous Every 48 hours 11/20/17 2314 11/21/17 0911   11/21/17 0400  piperacillin-tazobactam (ZOSYN) IVPB  3.375 g     3.375 g 12.5 mL/hr over 240 Minutes Intravenous Every 8 hours 11/20/17 2314     11/20/17 1800  vancomycin (VANCOCIN) IVPB 1000 mg/200 mL premix     1,000 mg 200 mL/hr over 60 Minutes Intravenous  Once 11/20/17 1750 11/21/17 0036   11/20/17 1800  ceFEPIme  (MAXIPIME) 2 g in sodium chloride 0.9 % 100 mL IVPB     2 g 200 mL/hr over 30 Minutes Intravenous  Once 11/20/17 1750 11/20/17 2048      Objective: Vitals:   11/22/17 0755 11/22/17 0800 11/22/17 0815 11/22/17 0900  BP:  (!) 141/79  (!) 148/76  Pulse:  100 (!) 103 (!) 101  Resp:  (!) 22 (!) 24 (!) 23  Temp:  97.8 F (36.6 C)    TempSrc:  Oral    SpO2: 94% 97% 98% 96%  Weight:      Height:        Intake/Output Summary (Last 24 hours) at 11/22/2017 0924 Last data filed at 11/22/2017 0816 Gross per 24 hour  Intake 2432.74 ml  Output 1580 ml  Net 852.74 ml   Filed Weights   11/21/17 0000 11/21/17 0500 11/22/17 0500  Weight: 110.7 kg (244 lb 0.8 oz) 112.7 kg (248 lb 7.3 oz) 112.3 kg (247 lb 9.2 oz)    Examination:  General exam: NAD Respiratory system: Decreased breath sounds on the left, right-sided wheezing Cardiovascular system: S1-S2, RRR, no murmurs Gastrointestinal system: Abdomen soft, nontender nondistended Central nervous system: Nonfocal Extremities: No lower extremity edema Skin: No rash or lesions Psychiatry: Mood and affect depressed and anxious   Data Reviewed: I have personally reviewed following labs and imaging studies  CBC: Recent Labs  Lab 11/18/17 0934 11/20/17 1612 11/21/17 0727 11/22/17 0316  WBC 18.2* 34.0* 29.0* 27.1*  NEUTROABS 14.6* 30.3*  --   --   HGB 11.0* 11.9* 10.6* 9.9*  HCT 32.8* 35.6* 32.7* 30.4*  MCV 79.8 81.3 81.8 81.3  PLT 370 371 298 161   Basic Metabolic Panel: Recent Labs  Lab 11/18/17 0934 11/20/17 1612 11/20/17 2317 11/21/17 0727 11/22/17 0316  NA 133* 138  --  136 134*  K 3.9 3.5  --  4.3 4.0  CL 99 101  --  103 100*  CO2 21* 24  --  24 22  GLUCOSE 132 161*  --  164* 190*  BUN 23 22*  --  23* 25*  CREATININE 1.33* 1.78*  --  1.50* 1.66*  CALCIUM 9.1 8.7*  --  8.1* 8.4*  MG  --   --  1.3*  --  1.7   GFR: Estimated Creatinine Clearance: 47.5 mL/min (A) (by C-G formula based on SCr of 1.66 mg/dL (H)). Liver  Function Tests: Recent Labs  Lab 11/18/17 0934 11/20/17 1612 11/21/17 0727  AST 18 20 17   ALT 15 15* 14*  ALKPHOS 86 100 86  BILITOT 0.5 0.4 0.6  PROT 6.0* 6.3* 5.5*  ALBUMIN 2.6* 2.9* 2.6*   No results for input(s): LIPASE, AMYLASE in the last 168 hours. No results for input(s): AMMONIA in the last 168 hours. Coagulation Profile: No results for input(s): INR, PROTIME in the last 168 hours. Cardiac Enzymes: Recent Labs  Lab 11/20/17 2317 11/21/17 0727 11/21/17 1043  TROPONINI 0.03* <0.03 0.03*   BNP (last 3 results) Recent Labs    08/08/17 0958  PROBNP 92.0   HbA1C: No results for input(s): HGBA1C in the last 72 hours. CBG: Recent Labs  Lab 11/22/17  0035 11/22/17 0809  GLUCAP 234* 181*   Lipid Profile: No results for input(s): CHOL, HDL, LDLCALC, TRIG, CHOLHDL, LDLDIRECT in the last 72 hours. Thyroid Function Tests: Recent Labs    11/20/17 2317  TSH 1.396   Anemia Panel: No results for input(s): VITAMINB12, FOLATE, FERRITIN, TIBC, IRON, RETICCTPCT in the last 72 hours. Sepsis Labs: Recent Labs  Lab 11/20/17 1656 11/20/17 1934 11/21/17 1043 11/22/17 0316  PROCALCITON  --   --  0.46 0.40  LATICACIDVEN 2.61* 1.14  --   --     Recent Results (from the past 240 hour(s))  Urine Culture     Status: None   Collection Time: 11/18/17 11:59 AM  Result Value Ref Range Status   Specimen Description   Final    URINE, CLEAN CATCH Performed at [facility], [place] [address], [place], [place] [address]    Special Requests   Final    NONE Performed at [facility], [place] [address], [place], [place] [address]    Culture   Final    NO GROWTH Performed at [facility], [place] [address] [place], [place] [address]    Report Status 11/19/2017 FINAL  Final  Blood culture (routine x 2)     Status: None (Preliminary result)   Collection Time: 11/20/17  4:49 PM  Result Value Ref Range Status   Specimen  Description   Final    BLOOD RIGHT ANTECUBITAL Performed at [place] [address], [place], [place] [address]    Special Requests   Final    BOTTLES DRAWN AEROBIC AND ANAEROBIC Blood Culture adequate volume Performed at [place] [address], [place], [place] [address]    Culture   Final    NO GROWTH < 24 HOURS Performed at [place] [address] [place], [place] [address]    Report Status PENDING  Incomplete  MRSA PCR Screening     Status: None   Collection Time: 11/20/17 11:02 PM  Result Value Ref Range Status   MRSA by PCR NEGATIVE NEGATIVE Final    Comment:        The GeneXpert MRSA Assay (FDA approved for NASAL specimens only), is one component of a comprehensive MRSA colonization surveillance program. It is not intended to diagnose MRSA infection nor to guide or monitor treatment for MRSA infections. Performed at [facility], [place] [address], [place], [place] [address]       Radiology Studies: Ct Angio Chest Pe W And/or Wo Contrast  Result Date: 11/20/2017 CLINICAL DATA:  Brought from cancer center post radiation treatment, Pt presents with dyspnea, diaphoretic, hypertensive and reported hypoxia, sp02 low 80s, placed on 6lts at center. Sp02 on arrival 99 on 2ltr nasal canula. History of stage IV lung cancer. EXAM: CT ANGIOGRAPHY CHEST WITH CONTRAST TECHNIQUE: Multidetector CT imaging of the chest was performed using the standard protocol during bolus administration of intravenous contrast. Multiplanar CT image reconstructions and MIPs were obtained to evaluate the vascular anatomy. CONTRAST:  57mL ISOVUE-370 IOPAMIDOL (ISOVUE-370) INJECTION 76% COMPARISON:  11/11/2017, 10/23/2017 FINDINGS: Cardiovascular: There is significant artifact from numerous calcified mediastinal and hilar lymph nodes. The pulmonary arteries are well opacified. There are filling defects within small RIGHT LOWER lobe  pulmonary arteries consistent with pulmonary embolus. There is marked narrowing of the LEFT pulmonary artery secondary to mediastinal and hilar mass. There is obstruction of the LEFT pulmonary veins, stable in appearance.  Coronary artery calcifications are present. Mediastinum/Nodes: Stable, large soft tissue mass involving the LEFT hilum and mediastinum. Lungs/Pleura: There is near complete obstruction of the LEFT mainstem bronchus. There is significant effacement of the RIGHT mainstem bronchus. There is near complete opacification of the LEFT lung with significantly less aerated lung in the UPPER lobe compared to prior studies. Findings are consistent with a combination of tumor and atelectasis. There are small amounts of pleural air anteriorly on the LEFT following thoracentesis earlier today. Moderate pleural effusion persists. Upper Abdomen: Cholecystectomy. Musculoskeletal: Degenerative changes in the thoracic spine. No suspicious lytic or blastic lesions. Review of the MIP images confirms the above findings. IMPRESSION: 1. Suspect small pulmonary emboli within the RIGHT LOWER lobe pulmonary artery branches. 2. Near complete obstruction of the LEFT mainstem bronchus. This results in near complete atelectasis of the LEFT UPPER and LOWER lobes. 3. Persistent moderate LEFT pleural effusion. 4. Extrinsic compression and resulting effacement of the LEFT pulmonary arteries. 5. Large mediastinal and LEFT hilar mass, stable in appearance. These results were called by telephone at the time of interpretation on 11/20/2017 at 6:43 pm to Dr. Shirlyn Goltz , who verbally acknowledged these results. Electronically Signed   By: Nolon Nations M.D.   On: 11/20/2017 18:46   Dg Chest Port 1 View  Result Date: 11/22/2017 CLINICAL DATA:  Acute respiratory failure.  Followup exam. EXAM: PORTABLE CHEST 1 VIEW COMPARISON:  11/21/2017 and older exams. FINDINGS: Complete opacification of the left hemithorax is unchanged from the most  recent prior exam. Right lung is clear. No pneumothorax. IMPRESSION: 1. No change from the most recent prior exam. Persistent complete opacification of the left hemithorax. Right lung remains clear. Electronically Signed   By: Lajean Manes M.D.   On: 11/22/2017 07:35   Dg Chest Port 1 View  Result Date: 11/21/2017 CLINICAL DATA:  Evaluate pleural effusion EXAM: PORTABLE CHEST 1 VIEW COMPARISON:  11/20/2017 FINDINGS: Complete whiteout of the left hemithorax again noted, stable. No confluent opacity on the right. Improved lung volumes on the right. Right hilar fullness is likely vascular when compared to prior chest CT. IMPRESSION: Continued complete opacification of the left hemithorax. Electronically Signed   By: Rolm Baptise M.D.   On: 11/21/2017 09:18   Dg Chest Port 1 View  Result Date: 11/20/2017 CLINICAL DATA:  Post thoracentesis. EXAM: PORTABLE CHEST 1 VIEW COMPARISON:  Chest x-ray Nov 20, 2017 FINDINGS: Complete opacification left hemithorax remains. No definitive pneumothorax. The right lung remains clear. No other changes. IMPRESSION: Continued complete opacification of the left hemithorax without convincing evidence of pneumothorax. Electronically Signed   By: Dorise Bullion III M.D   On: 11/20/2017 18:45   Dg Chest Port 1 View  Result Date: 11/20/2017 CLINICAL DATA:  History of lung carcinoma EXAM: PORTABLE CHEST 1 VIEW COMPARISON:  PET-CT of 11/11/2017 and chest x-ray of 10/24/2017 FINDINGS: There is now near complete opacification of the left hemithorax with slight mediastinal shift to the right. This likely suggests a large right pleural effusion. Heart size is difficult to assess. The right lung is clear. Reverse left shoulder arthroplasty is noted. IMPRESSION: 1. Near complete opacification of the left hemithorax with some mediastinal shift to the right indicating a large left pleural effusion. 2. The right lung is grossly clear. Electronically Signed   By: Ivar Drape M.D.   On:  11/20/2017 17:04      Scheduled Meds: . amiodarone  400 mg Oral BID  . arformoterol  15 mcg Nebulization BID  .  atorvastatin  20 mg Oral q1800  . budesonide (PULMICORT) nebulizer solution  0.25 mg Nebulization BID  . diltiazem  60 mg Oral Q6H  . DULoxetine  30 mg Oral Daily  . FLUoxetine  40 mg Oral Q breakfast  . guaiFENesin  600 mg Oral BID  . levalbuterol  0.63 mg Nebulization Q6H  . methylPREDNISolone (SOLU-MEDROL) injection  60 mg Intravenous Q6H   Continuous Infusions: . heparin 1,350 Units/hr (11/22/17 0800)  . magnesium sulfate 1 - 4 g bolus IVPB 2 g (11/22/17 0910)  . piperacillin-tazobactam (ZOSYN)  IV Stopped (11/22/17 0801)     LOS: 2 days    Time spent: Total of 35 minutes spent with pt, greater than 50% of which was spent in discussion of  treatment, counseling and coordination of care   Chipper Oman, MD Pager: Text Page via [email or web address]   If 7PM-7AM, please contact night-coverage [email or web address] 11/22/2017, 9:24 AM   Note - This record has been created using [employer]. Chart creation errors have been sought, but may not always have been located. Such creation errors do not reflect on the standard of medical care.

## 2017-11-23 LAB — CBC
HCT: 29.4 % — ABNORMAL LOW (ref 39.0–52.0)
HEMOGLOBIN: 9.5 g/dL — AB (ref 13.0–17.0)
MCH: 26.6 pg (ref 26.0–34.0)
MCHC: 32.3 g/dL (ref 30.0–36.0)
MCV: 82.4 fL (ref 78.0–100.0)
Platelets: 232 10*3/uL (ref 150–400)
RBC: 3.57 MIL/uL — ABNORMAL LOW (ref 4.22–5.81)
RDW: 15.4 % (ref 11.5–15.5)
WBC: 23 10*3/uL — ABNORMAL HIGH (ref 4.0–10.5)

## 2017-11-23 LAB — GLUCOSE, CAPILLARY
GLUCOSE-CAPILLARY: 149 mg/dL — AB (ref 65–99)
Glucose-Capillary: 186 mg/dL — ABNORMAL HIGH (ref 65–99)
Glucose-Capillary: 168 mg/dL — ABNORMAL HIGH (ref 65–99)

## 2017-11-23 LAB — BASIC METABOLIC PANEL
Anion gap: 11 (ref 5–15)
BUN: 29 mg/dL — AB (ref 6–20)
CHLORIDE: 100 mmol/L — AB (ref 101–111)
CO2: 23 mmol/L (ref 22–32)
Calcium: 8.5 mg/dL — ABNORMAL LOW (ref 8.9–10.3)
Creatinine, Ser: 1.42 mg/dL — ABNORMAL HIGH (ref 0.61–1.24)
GFR calc Af Amer: 53 mL/min — ABNORMAL LOW (ref >60)
GFR, EST NON AFRICAN AMERICAN: 46 mL/min — AB (ref >60)
GLUCOSE: 177 mg/dL — AB (ref 65–99)
Potassium: 3.9 mmol/L (ref 3.5–5.1)
Sodium: 134 mmol/L — ABNORMAL LOW (ref 135–145)

## 2017-11-23 LAB — APTT: aPTT: 74 seconds — ABNORMAL HIGH (ref 24–36)

## 2017-11-23 LAB — HEMOGLOBIN AND HEMATOCRIT, BLOOD
HEMATOCRIT: 30.1 % — AB (ref 39.0–52.0)
HEMOGLOBIN: 9.7 g/dL — AB (ref 13.0–17.0)

## 2017-11-23 LAB — MAGNESIUM: Magnesium: 2 mg/dL (ref 1.7–2.4)

## 2017-11-23 LAB — HEPARIN LEVEL (UNFRACTIONATED): HEPARIN UNFRACTIONATED: 1.66 [IU]/mL — AB (ref 0.30–0.70)

## 2017-11-23 MED ORDER — HYDROCORTISONE 1 % EX CREA
1.0000 "application " | TOPICAL_CREAM | Freq: Three times a day (TID) | CUTANEOUS | Status: DC | PRN
  Administered 2017-11-23: 1 via TOPICAL
  Filled 2017-11-23: qty 28

## 2017-11-23 MED ORDER — METHYLPREDNISOLONE SODIUM SUCC 125 MG IJ SOLR
60.0000 mg | Freq: Two times a day (BID) | INTRAMUSCULAR | Status: DC
  Administered 2017-11-23 – 2017-11-27 (×8): 60 mg via INTRAVENOUS
  Filled 2017-11-23 (×8): qty 2

## 2017-11-23 MED ORDER — HYDROCODONE-ACETAMINOPHEN 5-325 MG PO TABS
1.0000 | ORAL_TABLET | Freq: Once | ORAL | Status: AC
  Administered 2017-11-23: 1 via ORAL
  Filled 2017-11-23: qty 1

## 2017-11-23 NOTE — Plan of Care (Signed)
  Problem: Safety: Goal: Ability to remain free from injury will improve Outcome: Progressing   Problem: Safety: Goal: Ability to remain free from injury will improve Outcome: Progressing

## 2017-11-23 NOTE — Progress Notes (Signed)
PROGRESS NOTE [facility]   [name].   BDZ:[id number] DOB: [birth year]  DOA: 11/20/2017 PCP: Crist Infante, MD   Brief Narrative:  Travis Ferguson. is a 77 year old male with medical history significant for NSCLC recently started on chemo and radiation, COPD, OSA, sarcoidosis and chronic pain syndrome presented to the emergency department complaining of worsening of shortness of breath.  On ED evaluation patient was found to have a large pleural effusion, EDP performed thoracentesis at bedside.  Patient was also found to be in A. fib with RVR along with hypotension.  CTA chest was done which shows small pulmonary embolism on the right, with left pleural effusion and near total obstruction of the left main bronchus.  Patient was admitted with working diagnosis of acute on chronic respiratory failure due to malignant pleural effusion complicated with A. fib with RVR. Hospital course complicated by AKI. Cardiology and PCCM following   Subjective: Patient seen and examined, his breathing is more comfortable today, but still SOB. Tolerating BiPAP at night. Denies chest pain. No acute events overnight. Remains afebrile   Assessment & Plan: Acute on chronic respiratory failure with hypoxia Multifactorial from pleural effusion, Afib with RVR and ? Post obstructive PNA with severe leukocytosis pulmonary embolism and COPD.  Taper Solumedrol, continue nebulizer treatment. Continue O2 supplementation to keep O2 sat > 91%. Treat underlying causes.   Recurrent malignant pleural effusion  Recently diagnosed with NSCLC S/p Thoracentesis in the ED yield 500 cc  Started radio and chemo this month, follow by Dr Earlie Server Will discuss with Dr. Earlie Server in AM to weight in prognosis and plan. Patient may need hospice  Palliative recommendations appreciated   Pulmonary embolism Continue heparin drip,intermittently on BiPAP, currently doing well on [place]. Will hold BiPAP for tonight, resume CPAP and  monitor.  PCCM not recommending lysis at this time given small anatomic clot burden plus poor prognosis Continue to monitor closely.  A fib with RVR  In setting of compressive mass Converted to NSR, patient on Amio and Cardizem, rate better controlled now. Expect rate to have bouts  up to 130's given increase in pressure from mass. Continue cardizem and Amio. Card have signed off.   ? Post obstructive PNA  Leukocytosis improving  Will continue Zosyn for now, d/c vancomycin as MRSA is negative  Procalcitonin not a good indicator on patients receiving active cancer treatment.  Will follow clinically  AKI  Likely prerenal, from hypotension Cr improved after holding Lasix Continue to monitor renal function   Combine CHF  No signs of fluid overload Continue to monitor closely   COPD/OSA CPAP See above   NSCLC  Started radio and chemo this month, follow by Dr Earlie Server, poor prognosis  May need Hospice  Goals of care  Discussed with patient poor prognosis, patient stated that he does not want life support, and does not want to go to a residential facility. He want to have his EOL care at home. Case manager consulted for hospice referral   DVT prophylaxis: Heparin Sq Code Status: DNR Family Communication: None at bedside  Disposition Plan: To be determine   Consultants:   Cardiology  PCCM  Procedures:   None  Antimicrobials: Anti-infectives (From admission, onward)   Start     Dose/Rate Route Frequency Ordered Stop   11/22/17 0500  vancomycin (VANCOCIN) 1,250 mg in sodium chloride 0.9 % 250 mL IVPB  Status:  Discontinued     1,250 mg 166.7 mL/hr over 90 Minutes Intravenous Every  24 hours 11/21/17 0911 11/22/17 0919   11/21/17 0600  vancomycin (VANCOCIN) 1,500 mg in sodium chloride 0.9 % 500 mL IVPB  Status:  Discontinued     1,500 mg 250 mL/hr over 120 Minutes Intravenous Every 48 hours 11/20/17 2314 11/21/17 0911   11/21/17 0400  piperacillin-tazobactam (ZOSYN) IVPB  3.375 g     3.375 g 12.5 mL/hr over 240 Minutes Intravenous Every 8 hours 11/20/17 2314     11/20/17 1800  vancomycin (VANCOCIN) IVPB 1000 mg/200 mL premix     1,000 mg 200 mL/hr over 60 Minutes Intravenous  Once 11/20/17 1750 11/21/17 0036   11/20/17 1800  ceFEPIme (MAXIPIME) 2 g in sodium chloride 0.9 % 100 mL IVPB     2 g 200 mL/hr over 30 Minutes Intravenous  Once 11/20/17 1750 11/20/17 2048      Objective: Vitals:   11/23/17 0434 11/23/17 0500 11/23/17 0600 11/23/17 0716  BP:  (!) 144/90 (!) 147/83   Pulse:  100 96   Resp:  19 19   Temp:    (!) 97.5 F (36.4 C)  TempSrc:    Axillary  SpO2: 99% 96% 97% 97%  Weight:   115.4 kg (254 lb 6.6 oz)   Height:        Intake/Output Summary (Last 24 hours) at 11/23/2017 0847 Last data filed at 11/23/2017 0600 Gross per 24 hour  Intake 853.33 ml  Output 1450 ml  Net -596.67 ml   Filed Weights   11/21/17 0500 11/22/17 0500 11/23/17 0600  Weight: 112.7 kg (248 lb 7.3 oz) 112.3 kg (247 lb 9.2 oz) 115.4 kg (254 lb 6.6 oz)    Examination:  General: Pt is alert, awake, not in acute distress Cardiovascular: RRR, S1/S2 +, no rubs, no gallops Respiratory: Decrease BS in the left, diffuse wheezing, SOB with speaking  Abdominal: Soft, NT, ND, bowel sounds + Extremities: no edema, no cyanosis  Data Reviewed: I have personally reviewed following labs and imaging studies  CBC: Recent Labs  Lab 11/18/17 0934 11/20/17 1612 11/21/17 0727 11/22/17 0316 11/23/17 0316  WBC 18.2* 34.0* 29.0* 27.1* 23.0*  NEUTROABS 14.6* 30.3*  --   --   --   HGB 11.0* 11.9* 10.6* 9.9* 9.5*  HCT 32.8* 35.6* 32.7* 30.4* 29.4*  MCV 79.8 81.3 81.8 81.3 82.4  PLT 370 371 298 295 825   Basic Metabolic Panel: Recent Labs  Lab 11/18/17 0934 11/20/17 1612 11/20/17 2317 11/21/17 0727 11/22/17 0316 11/23/17 0316  NA 133* 138  --  136 134* 134*  K 3.9 3.5  --  4.3 4.0 3.9  CL 99 101  --  103 100* 100*  CO2 21* 24  --  24 22 23   GLUCOSE 132 161*  --   164* 190* 177*  BUN 23 22*  --  23* 25* 29*  CREATININE 1.33* 1.78*  --  1.50* 1.66* 1.42*  CALCIUM 9.1 8.7*  --  8.1* 8.4* 8.5*  MG  --   --  1.3*  --  1.7 2.0   GFR: Estimated Creatinine Clearance: 56.3 mL/min (A) (by C-G formula based on SCr of 1.42 mg/dL (H)). Liver Function Tests: Recent Labs  Lab 11/18/17 0934 11/20/17 1612 11/21/17 0727  AST 18 20 17   ALT 15 15* 14*  ALKPHOS 86 100 86  BILITOT 0.5 0.4 0.6  PROT 6.0* 6.3* 5.5*  ALBUMIN 2.6* 2.9* 2.6*   No results for input(s): LIPASE, AMYLASE in the last 168 hours. No results for input(s): AMMONIA  in the last 168 hours. Coagulation Profile: No results for input(s): INR, PROTIME in the last 168 hours. Cardiac Enzymes: Recent Labs  Lab 11/20/17 2317 11/21/17 0727 11/21/17 1043  TROPONINI 0.03* <0.03 0.03*   BNP (last 3 results) Recent Labs    08/08/17 0958  PROBNP 92.0   HbA1C: No results for input(s): HGBA1C in the last 72 hours. CBG: Recent Labs  Lab 11/22/17 0035 11/22/17 0809 11/22/17 1646 11/22/17 2314 11/23/17 0733  GLUCAP 234* 181* 164* 197* 186*   Lipid Profile: No results for input(s): CHOL, HDL, LDLCALC, TRIG, CHOLHDL, LDLDIRECT in the last 72 hours. Thyroid Function Tests: Recent Labs    11/20/17 2317  TSH 1.396   Anemia Panel: No results for input(s): VITAMINB12, FOLATE, FERRITIN, TIBC, IRON, RETICCTPCT in the last 72 hours. Sepsis Labs: Recent Labs  Lab 11/20/17 1656 11/20/17 1934 11/21/17 1043 11/22/17 0316  PROCALCITON  --   --  0.46 0.40  LATICACIDVEN 2.61* 1.14  --   --     Recent Results (from the past 240 hour(s))  Urine Culture     Status: None   Collection Time: 11/18/17 11:59 AM  Result Value Ref Range Status   Specimen Description   Final    URINE, CLEAN CATCH Performed at [facility], [place] [address] [place], [place] [address]    Special Requests   Final    NONE Performed at [facility], [place] [address] [place], [place] [address]    Culture   Final    NO GROWTH Performed at [facility], [place] [address], [place], [place] [address]    Report Status 11/19/2017 FINAL  Final  Blood culture (routine x 2)     Status: None (Preliminary result)   Collection Time: 11/20/17  4:49 PM  Result Value Ref Range Status   Specimen Description   Final    BLOOD RIGHT ANTECUBITAL Performed at [place] [address], [place], [place] [address]    Special Requests   Final    BOTTLES DRAWN AEROBIC AND ANAEROBIC Blood Culture adequate volume Performed at [place] [address], [place], [place] [address]    Culture   Final    NO GROWTH 2 DAYS Performed at [place] [address] [place], [place] [address]    Report Status PENDING  Incomplete  MRSA PCR Screening     Status: None   Collection Time: 11/20/17 11:02 PM  Result Value Ref Range Status   MRSA by PCR NEGATIVE NEGATIVE Final    Comment:        The GeneXpert MRSA Assay (FDA approved for NASAL specimens only), is one component of a comprehensive MRSA colonization surveillance program. It is not intended to diagnose MRSA infection nor to guide or monitor treatment for MRSA infections. Performed at [facility], [place] [address], [place], [place] [address]   Blood culture (routine x 2)     Status: None (Preliminary result)   Collection Time: 11/20/17 11:17 PM  Result Value Ref Range Status   Specimen Description   Final    BLOOD RIGHT ANTECUBITAL Performed at [facility] [address] [place], [place] [address]    Special Requests   Final    BOTTLES DRAWN AEROBIC AND ANAEROBIC Blood Culture adequate volume Performed at [place] [address], [place],  [address]    Culture   Final  NO GROWTH 1 DAY Performed at [facility], [place] [address], [place], [place] [address]    Report Status  PENDING  Incomplete      Radiology Studies: Dg Chest Port 1 View  Result Date: 11/22/2017 CLINICAL DATA:  Acute respiratory failure.  Followup exam. EXAM: PORTABLE CHEST 1 VIEW COMPARISON:  11/21/2017 and older exams. FINDINGS: Complete opacification of the left hemithorax is unchanged from the most recent prior exam. Right lung is clear. No pneumothorax. IMPRESSION: 1. No change from the most recent prior exam. Persistent complete opacification of the left hemithorax. Right lung remains clear. Electronically Signed   By: Lajean Manes M.D.   On: 11/22/2017 07:35   Dg Chest Port 1 View  Result Date: 11/21/2017 CLINICAL DATA:  Evaluate pleural effusion EXAM: PORTABLE CHEST 1 VIEW COMPARISON:  11/20/2017 FINDINGS: Complete whiteout of the left hemithorax again noted, stable. No confluent opacity on the right. Improved lung volumes on the right. Right hilar fullness is likely vascular when compared to prior chest CT. IMPRESSION: Continued complete opacification of the left hemithorax. Electronically Signed   By: Rolm Baptise M.D.   On: 11/21/2017 09:18      Scheduled Meds: . amiodarone  400 mg Oral BID  . arformoterol  15 mcg Nebulization BID  . atorvastatin  20 mg Oral q1800  . budesonide (PULMICORT) nebulizer solution  0.25 mg Nebulization BID  . diltiazem  60 mg Oral Q6H  . DULoxetine  30 mg Oral Daily  . FLUoxetine  40 mg Oral Q breakfast  . guaiFENesin  600 mg Oral BID  . levalbuterol  0.63 mg Nebulization Q6H  . methylPREDNISolone (SOLU-MEDROL) injection  60 mg Intravenous Q12H   Continuous Infusions: . heparin 1,400 Units/hr (11/23/17 0600)  . piperacillin-tazobactam (ZOSYN)  IV 3.375 g (11/23/17 0549)     LOS: 3 days    Time spent: Total of 35 minutes spent with pt, greater than 50% of which was spent in discussion of  treatment, counseling and coordination of care   Chipper Oman, MD Pager: Text Page via [email or web address]   If 7PM-7AM, please contact  night-coverage [email or web address] 11/23/2017, 8:47 AM   Note - This record has been created using [employer]. Chart creation errors have been sought, but may not always have been located. Such creation errors do not reflect on the standard of medical care.

## 2017-11-23 NOTE — Progress Notes (Signed)
11/23/2017 1000 NCM spoke to pt, wife and son, [name] # [phone] at bedside. Pt gave permission to speak to son away from room. Spoke to pt's son, Herbie [place] offered choice for Home Hospice and [facility]. Son stated they wanted to wait until Oncologist saw pt on Monday before making decision. Son state Pt wants to dc home (refusing SNF) but pt's wife unable to provide 24 hour care. NCM explained pt/family may have to pay out of pocket for an aide to assist at home. Explained NCM will follow up with pt and family on decision. Son prefers NCM speak to him prior to setting up home services. Jonnie Finner RN CCM Case Mgmt phone [phone]

## 2017-11-23 NOTE — Progress Notes (Signed)
ANTICOAGULATION CONSULT NOTE   Pharmacy Consult for heparin Indication:hx  atrial fibrillation; suspects new acute PE (home Eliquis on hold)  Allergies  Allergen Reactions  . Statins Other (See Comments)    Muscle aches - tolerating Vytorin    Patient Measurements: Height: 5\' 11"  (180.3 cm) Weight: 254 lb 6.6 oz (115.4 kg) IBW/kg (Calculated) : 75.3 Heparin Dosing Weight: 99 kg  Vital Signs: Temp: 97.5 F (36.4 C) (06/02 0716) Temp Source: Axillary (06/02 0716) BP: 147/83 (06/02 0600) Pulse Rate: 96 (06/02 0600)  Labs: Recent Labs    11/20/17 2317 11/21/17 0727 11/21/17 1043  11/22/17 0316 11/22/17 1340 11/23/17 0316  HGB  --  10.6*  --   --  9.9*  --  9.5*  HCT  --  32.7*  --   --  30.4*  --  29.4*  PLT  --  298  --   --  295  --  232  APTT  --  134*  --    < > 66* 66* 74*  HEPARINUNFRC  --  >2.20*  --   --  >2.20*  --  1.66*  CREATININE  --  1.50*  --   --  1.66*  --  1.42*  TROPONINI 0.03* <0.03 0.03*  --   --   --   --    < > = values in this interval not displayed.    Estimated Creatinine Clearance: 56.3 mL/min (A) (by C-G formula based on SCr of 1.42 mg/dL (H)).   Medications:  - Eliquis 5 mg bid PTA (last dose taken on 5/30 at 11a)  Assessment: Patient's a 77 y.o M with hx lung cancer and afib on Eliquis PTA, presented to the ED on 5/30 with c/o SOB and back pain.  Chest CTA on 5/30 showed moderate L pleural effusion (thoracentesis performed in the ED at ~6p) and findings with suspicion for small RLL PE.  Eliquis placed on hold and patient was started on heparin drip for afib and PE.  Today, 11/23/2017: - aPTT remains therapeutic at 74 seconds; HL is elevated at 1.66 but this is likely reflective from residual effect of eliquis; Will adjust heparin rate based on aPTT at this time until both heparin level and aPTT correlate with each other - hgb down slightly, plts ok - No bleeding documented - scr labile, up 1.66 today  Goal of Therapy:  Heparin level  0.3-0.7 units/ml aPTT 66-102 seconds Monitor platelets by anticoagulation protocol: Yes   Plan:  - contijue heparin drip at 1400 units/hr - f/u with labs in AM and adjust as if needed - monitor for s/s bleeding   Martin Smeal P 11/23/2017,8:55 AM

## 2017-11-23 NOTE — Progress Notes (Signed)
D/w [facility] -> pccm can sign off. Working on goals of care  Dr. Brand Males, M.D., [facility].C.P Pulmonary and Critical Care Medicine Staff Physician, [place] Director - Interstitial Lung Disease  Program  Pulmonary [place] at [place], [place], [address]  Pager: [phone], If no answer or between  15:00h - 7:00h: call [phone] Telephone: [phone]

## 2017-11-23 NOTE — Progress Notes (Signed)
Entered room and patient off Bipap on [place].  Patient stated that he just wanted a little break from the mask.  Will resume Bipap shortly.

## 2017-11-24 ENCOUNTER — Inpatient Hospital Stay (HOSPITAL_COMMUNITY): Payer: Medicare Other

## 2017-11-24 ENCOUNTER — Ambulatory Visit: Payer: Medicare Other

## 2017-11-24 ENCOUNTER — Telehealth: Payer: Self-pay

## 2017-11-24 LAB — CBC
HCT: 29.7 % — ABNORMAL LOW (ref 39.0–52.0)
Hemoglobin: 9.6 g/dL — ABNORMAL LOW (ref 13.0–17.0)
MCH: 26.9 pg (ref 26.0–34.0)
MCHC: 32.3 g/dL (ref 30.0–36.0)
MCV: 83.2 fL (ref 78.0–100.0)
PLATELETS: 224 10*3/uL (ref 150–400)
RBC: 3.57 MIL/uL — AB (ref 4.22–5.81)
RDW: 15.5 % (ref 11.5–15.5)
WBC: 21.8 10*3/uL — AB (ref 4.0–10.5)

## 2017-11-24 LAB — BASIC METABOLIC PANEL
ANION GAP: 10 (ref 5–15)
BUN: 31 mg/dL — AB (ref 6–20)
CALCIUM: 8.7 mg/dL — AB (ref 8.9–10.3)
CO2: 26 mmol/L (ref 22–32)
Chloride: 100 mmol/L — ABNORMAL LOW (ref 101–111)
Creatinine, Ser: 1.11 mg/dL (ref 0.61–1.24)
GFR calc Af Amer: >60 mL/min (ref >60)
GLUCOSE: 185 mg/dL — AB (ref 65–99)
POTASSIUM: 4.1 mmol/L (ref 3.5–5.1)
SODIUM: 136 mmol/L (ref 135–145)

## 2017-11-24 LAB — MAGNESIUM: MAGNESIUM: 1.9 mg/dL (ref 1.7–2.4)

## 2017-11-24 LAB — GLUCOSE, CAPILLARY
GLUCOSE-CAPILLARY: 164 mg/dL — AB (ref 65–99)
Glucose-Capillary: 162 mg/dL — ABNORMAL HIGH (ref 65–99)

## 2017-11-24 MED ORDER — ALUM & MAG HYDROXIDE-SIMETH 200-200-20 MG/5ML PO SUSP
15.0000 mL | ORAL | Status: DC | PRN
  Administered 2017-11-24 (×2): 15 mL via ORAL
  Filled 2017-11-24 (×2): qty 30

## 2017-11-24 NOTE — Progress Notes (Signed)
Palliative consult done on 6/1. Awaiting oncology input in terms of prognosis, plans for further tx and possible hospice referral-patient hoping hospice can help him stay at home which is an important goal for him.Will follow.  Lane Hacker, DO Palliative Medicine [phone]

## 2017-11-24 NOTE — Progress Notes (Signed)
Pt. Remains off Bipap at this time.

## 2017-11-24 NOTE — Telephone Encounter (Signed)
I was asked by [facility] to call and check on [name] in the ICU and see if he would be willing to come for radiation today. I spoke with his nurse, Estill Bamberg and see spoke with [name] and he declined to have radiation treatment today. I have notified [facility] of his decision.

## 2017-11-24 NOTE — Progress Notes (Signed)
MD Dr. Quincy Simmonds wants to resume heparin gtt now and stop heparin gtt at [facility] -  for drain placement 6/4. Heparin gtt needs to be stopped 2 hours prior to drain placement. Pharmacy made aware. Will continue to monitor.

## 2017-11-24 NOTE — Plan of Care (Signed)
  Problem: Education: Goal: Knowledge of General Education information will improve Outcome: Progressing   Problem: Clinical Measurements: Goal: Will remain free from infection Outcome: Progressing Goal: Diagnostic test results will improve Outcome: Progressing Goal: Respiratory complications will improve Outcome: Progressing Goal: Cardiovascular complication will be avoided Outcome: Progressing   Problem: Nutrition: Goal: Adequate nutrition will be maintained Outcome: Progressing   Problem: Coping: Goal: Level of anxiety will decrease Outcome: Progressing   Problem: Safety: Goal: Ability to remain free from injury will improve Outcome: Progressing   Problem: Skin Integrity: Goal: Risk for impaired skin integrity will decrease Outcome: Progressing

## 2017-11-24 NOTE — Progress Notes (Signed)
ANTICOAGULATION CONSULT NOTE   Pharmacy Consult for heparin Indication:hx  atrial fibrillation; suspects new acute PE (home Eliquis on hold)  Allergies  Allergen Reactions  . Statins Other (See Comments)    Muscle aches - tolerating Vytorin    Patient Measurements: Height: 5\' 11"  (180.3 cm) Weight: 254 lb 6.6 oz (115.4 kg) IBW/kg (Calculated) : 75.3 Heparin Dosing Weight: 99 kg  Vital Signs: Temp: 98.4 F (36.9 C) (06/03 1600) Temp Source: Oral (06/03 1600) BP: 126/81 (06/03 1600) Pulse Rate: 114 (06/03 1600)  Labs: Recent Labs    11/22/17 0316 11/22/17 1340 11/23/17 0316 11/23/17 1523 11/24/17 0338  HGB 9.9*  --  9.5* 9.7* 9.6*  HCT 30.4*  --  29.4* 30.1* 29.7*  PLT 295  --  232  --  224  APTT 66* 66* 74*  --   --   HEPARINUNFRC >2.20*  --  1.66*  --   --   CREATININE 1.66*  --  1.42*  --  1.11    Estimated Creatinine Clearance: 72 mL/min (by C-G formula based on SCr of 1.11 mg/dL).   Medications:  - Eliquis 5 mg bid PTA (last dose taken on 5/30 at 11a)  Assessment: Patient's a 77 y.o M with hx lung cancer and afib on Eliquis PTA, presented to the ED on 5/30 with c/o SOB and back pain.  Chest CTA on 5/30 showed moderate L pleural effusion (thoracentesis performed in the ED at ~6p) and findings with suspicion for small RLL PE.  Eliquis placed on hold and patient was started on heparin drip for afib and PE.   Significant Events: 6/2 Heparin gtt turned off for hematuria  Today, 11/24/2017:  IR not placing Pleurx drain today,  [facility] has asked to resume heparin gtt and stop at 0600 6/4.    CBC: Hgb stable at 9.6, pltc WNL  Previously dosing heparin per aPTT due to outpatient apixaban use.    APTT therapeutic on 1400 units/hr  Goal of Therapy:  Heparin level 0.3-0.7 units/ml aPTT 66-102 seconds Monitor platelets by anticoagulation protocol: Yes   Plan:  - Resume heparin drip at 1400 units/hr (no bolus) - check 8h heparin level - heparin off at 06:00 6/4  am - monitor for s/s bleeding  Doreene Eland, PharmD, BCPS.   Pager: [phone] 11/24/2017 5:07 PM

## 2017-11-24 NOTE — Progress Notes (Signed)
PROGRESS NOTE [facility]   [name].   LNL:[id number] DOB: July 07, 1940  DOA: 11/20/2017 PCP: Crist Infante, MD   Brief Narrative:  Travis Rollyson. is a 77 year old male with medical history significant for NSCLC recently started on chemo and radiation, COPD, OSA, sarcoidosis and chronic pain syndrome presented to the emergency department complaining of worsening of shortness of breath.  On ED evaluation patient was found to have a large pleural effusion, EDP performed thoracentesis at bedside.  Patient was also found to be in A. fib with RVR along with hypotension.  CTA chest was done which shows small pulmonary embolism on the right, with left pleural effusion and near total obstruction of the left main bronchus.  Patient was admitted with working diagnosis of acute on chronic respiratory failure due to malignant pleural effusion complicated with A. fib with RVR. Hospital course complicated by AKI. Cardiology and PCCM following   Subjective: Patient seen and examined, he doing ok today. Cough has increase, tolerating O2 well. Had diarrhea last night. Afebrile.   Assessment & Plan: Acute on chronic respiratory failure with hypoxia Multifactorial from pleural effusion, Afib with RVR and Post obstructive PNA with severe leukocytosis pulmonary embolism and COPD.  Continue Solu-Medrol and nebulizer treatment continue O2 supplementation to keep O2 sat > 91%. Treat underlying causes.   Recurrent left sided malignant pleural effusion  Recently diagnosed with NSCLC S/p Thoracentesis in the ED yield 500 cc  Started radio and chemo this month, follow by Dr Earlie Server Repeated xray today with no changes  Patient in agreement for Pleurex - discussed with IR  Awaiting for oncology to discuss prognosis and further treatment plan. Patient may benefit from hospice.    Pulmonary embolism Continue heparin drip,intermittently on BiPAP, currently doing well on [place]. Discontinue BiPAP, will place  on CPAP tonight and continue to monitor respiratory status PCCM not recommending lysis at this time given small anatomic clot burden plus poor prognosis Continue to monitor closely.  A fib with RVR - much improved  In setting of compressive mass Converted to NSR, patient on Amio and Cardizem, rate better controlled now. Expect rate to have bouts  up to 130's given increase in pressure from mass. Continue cardizem and Amio. Cards have signed off.   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  Continue to monitor closely   COPD/OSA CPAP See above   NSCLC  Started radio and chemo this month, follow by Dr Earlie Server, poor prognosis   Holding chemo and radiation for now   DVT prophylaxis: Heparin Sq Code Status: DNR Family Communication: None at bedside  Disposition Plan: Transfer to telemetry   Consultants:   Cardiology  PCCM  Procedures:   None  Antimicrobials: Anti-infectives (From admission, onward)   Start     Dose/Rate Route Frequency Ordered Stop   11/22/17 0500  vancomycin (VANCOCIN) 1,250 mg in sodium chloride 0.9 % 250 mL IVPB  Status:  Discontinued     1,250 mg 166.7 mL/hr over 90 Minutes Intravenous Every 24 hours 11/21/17 0911 11/22/17 0919   11/21/17 0600  vancomycin (VANCOCIN) 1,500 mg in sodium chloride 0.9 % 500 mL IVPB  Status:  Discontinued     1,500 mg 250 mL/hr over 120 Minutes Intravenous Every 48 hours 11/20/17 2314 11/21/17 0911   11/21/17 0400  piperacillin-tazobactam (ZOSYN) IVPB 3.375 g     3.375 g 12.5 mL/hr over 240 Minutes Intravenous Every 8 hours 11/20/17 2314     11/20/17 1800  vancomycin (VANCOCIN) IVPB 1000 mg/200 mL  premix     1,000 mg 200 mL/hr over 60 Minutes Intravenous  Once 11/20/17 1750 11/21/17 0036   11/20/17 1800  ceFEPIme (MAXIPIME) 2 g in sodium chloride 0.9 % 100 mL IVPB     2 g 200 mL/hr over 30 Minutes Intravenous  Once 11/20/17 1750 11/20/17 2048      Objective: Vitals:   11/24/17 0700 11/24/17 0800 11/24/17 0857 11/24/17 0900  BP: 126/79 (!) 172/91  (!) 146/75  Pulse: 96 (!) 106  99  Resp: 19 19  20   Temp:  (!) 97.1 F (36.2 C)    TempSrc:  Axillary    SpO2: 98% 97% 95% 100%  Weight:      Height:        Intake/Output Summary (Last 24 hours) at 11/24/2017 0931 Last data filed at 11/24/2017 0630 Gross per 24 hour  Intake 378.8 ml  Output 900 ml  Net -521.2 ml   Filed Weights   11/21/17 0500 11/22/17 0500 11/23/17 0600  Weight: 112.7 kg (248 lb 7.3 oz) 112.3 kg (247 lb 9.2 oz) 115.4 kg (254 lb 6.6 oz)    Examination:  General: Pt is alert, awake, not in acute distress Cardiovascular: RRR, S1/S2 + Respiratory: Decrease BS on the L, diffuse wheezing B/L, difficulty with speaking in full sentences  Abdominal: Soft, NT, ND, bowel sounds + Extremities: Trace LE edema   Data Reviewed: I have personally reviewed following labs and imaging studies  CBC: Recent Labs  Lab 11/18/17 0934  11/20/17 1612 11/21/17 0727 11/22/17 0316 11/23/17 0316 11/23/17 1523 11/24/17 0338  WBC 18.2*  --  34.0* 29.0* 27.1* 23.0*  --  21.8*  NEUTROABS 14.6*  --  30.3*  --   --   --   --   --   HGB 11.0*   < > 11.9* 10.6* 9.9* 9.5* 9.7* 9.6*  HCT 32.8*  --  35.6* 32.7* 30.4* 29.4* 30.1* 29.7*  MCV 79.8  --  81.3 81.8 81.3 82.4  --  83.2  PLT 370  --  371 298 295 232  --  224   < > = values in this interval not displayed.   Basic Metabolic Panel: Recent Labs  Lab 11/20/17 1612 11/20/17 2317 11/21/17 0727 11/22/17 0316 11/23/17 0316 11/24/17 0338  NA 138  --  136 134* 134* 136  K 3.5  --  4.3 4.0 3.9 4.1  CL 101  --  103 100* 100* 100*  CO2 24  --  24 22 23 26   GLUCOSE 161*  --   164* 190* 177* 185*  BUN 22*  --  23* 25* 29* 31*  CREATININE 1.78*  --  1.50* 1.66* 1.42* 1.11  CALCIUM 8.7*  --  8.1* 8.4* 8.5* 8.7*  MG  --  1.3*  --  1.7 2.0 1.9   GFR: Estimated Creatinine Clearance: 72 mL/min (by C-G formula based on SCr of 1.11 mg/dL). Liver Function Tests: Recent Labs  Lab 11/18/17 0934 11/20/17 1612 11/21/17 0727  AST 18 20 17   ALT 15 15* 14*  ALKPHOS 86 100 86  BILITOT 0.5 0.4 0.6  PROT 6.0* 6.3* 5.5*  ALBUMIN 2.6* 2.9* 2.6*   No results for input(s): LIPASE, AMYLASE in the last 168 hours. No results for input(s): AMMONIA in the last 168 hours. Coagulation Profile: No results for input(s): INR, PROTIME in the last 168 hours. Cardiac Enzymes: Recent Labs  Lab 11/20/17 2317 11/21/17 0727 11/21/17 1043  TROPONINI 0.03* <0.03 0.03*   BNP (last 3 results) Recent Labs    08/08/17 0958  PROBNP 92.0   HbA1C: No results for input(s): HGBA1C in the last 72 hours. CBG: Recent Labs  Lab 11/22/17 2314 11/23/17 0733 11/23/17 1620 11/23/17 2310 11/24/17 0757  GLUCAP 197* 186* 149* 168* 162*   Lipid Profile: No results for input(s): CHOL, HDL, LDLCALC, TRIG, CHOLHDL, LDLDIRECT in the last 72 hours. Thyroid Function Tests: No results for input(s): TSH, T4TOTAL, FREET4, T3FREE, THYROIDAB in the last 72 hours. Anemia Panel: No results for input(s): VITAMINB12, FOLATE, FERRITIN, TIBC, IRON, RETICCTPCT in the last 72 hours. Sepsis Labs: Recent Labs  Lab 11/20/17 1656 11/20/17 1934 11/21/17 1043 11/22/17 0316  PROCALCITON  --   --  0.46 0.40  LATICACIDVEN 2.61* 1.14  --   --     Recent Results (from the past 240 hour(s))  Urine Culture     Status: None   Collection Time: 11/18/17 11:59 AM  Result Value Ref Range Status   Specimen Description   Final    URINE, CLEAN CATCH Performed at [facility], [place] [address], [place], [place] [address]    Special Requests   Final    NONE Performed at [facility], [place] [address] [place], [place] [address]    Culture   Final    NO GROWTH Performed at [facility], [place] [address], [place], [place] [address]    Report Status 11/19/2017 FINAL  Final  Blood culture (routine x 2)     Status: None (Preliminary result)   Collection Time: 11/20/17  4:49 PM  Result Value Ref Range Status   Specimen Description   Final    BLOOD RIGHT ANTECUBITAL Performed at [place] [address] [place], [place] [address]    Special Requests   Final    BOTTLES DRAWN AEROBIC AND ANAEROBIC Blood Culture adequate volume Performed at [place] [address], [place], [place] [address]    Culture   Final    NO GROWTH 3 DAYS Performed at [facility], [place] [address], [place], [place] [address]    Report Status PENDING  Incomplete  MRSA PCR Screening     Status: None   Collection Time: 11/20/17 11:02 PM  Result Value Ref Range Status   MRSA by PCR NEGATIVE NEGATIVE Final    Comment:        The GeneXpert MRSA Assay (FDA approved for NASAL specimens only), is one component of a comprehensive MRSA colonization surveillance program. It is not intended to diagnose MRSA infection nor to guide or monitor treatment for MRSA infections. Performed at [facility], [place] [address] [place], [place] [address]   Blood culture (routine x 2)     Status: None (Preliminary result)   Collection Time: 11/20/17 11:17 PM  Result Value Ref Range Status   Specimen Description   Final    BLOOD RIGHT ANTECUBITAL Performed at [place] [address], [place], [place] [address]    Special Requests   Final    BOTTLES DRAWN AEROBIC AND ANAEROBIC Blood  Culture adequate volume Performed at [place] [address] [place], [place] [address]    Culture   Final    NO GROWTH 2 DAYS Performed at [place] [address],  [place], [place] [address]    Report Status PENDING  Incomplete      Radiology Studies: No results found.    Scheduled Meds: . amiodarone  400 mg Oral BID  . arformoterol  15 mcg Nebulization BID  . atorvastatin  20 mg Oral q1800  . budesonide (PULMICORT) nebulizer solution  0.25 mg Nebulization BID  . diltiazem  60 mg Oral Q6H  . DULoxetine  30 mg Oral Daily  . FLUoxetine  40 mg Oral Q breakfast  . guaiFENesin  600 mg Oral BID  . levalbuterol  0.63 mg Nebulization Q6H  . methylPREDNISolone (SOLU-MEDROL) injection  60 mg Intravenous Q12H   Continuous Infusions: . heparin Stopped (11/23/17 1512)  . piperacillin-tazobactam (ZOSYN)  IV Stopped (11/24/17 0725)     LOS: 4 days    Time spent: Total of 25 minutes spent with pt, greater than 50% of which was spent in discussion of  treatment, counseling and coordination of care   Chipper Oman, MD Pager: Text Page via [email or web address]   If 7PM-7AM, please contact night-coverage [email or web address] 11/24/2017, 9:31 AM   Note - This record has been created using [employer]. Chart creation errors have been sought, but may not always have been located. Such creation errors do not reflect on the standard of medical care.

## 2017-11-24 NOTE — Progress Notes (Signed)
Pt has history of a fib and is currently going in and out of a fib. Dr. Quincy Simmonds made aware. MD trying to coordinate with IR for drain placement today. MD advised if not able to place drain today will restart heparin today. Will continue to monitor.

## 2017-11-24 NOTE — Progress Notes (Signed)
Pt. C/o nausea, bipap removed at this time.

## 2017-11-24 NOTE — Progress Notes (Signed)
Pharmacy Antibiotic Note  Travis Ferguson. is a 77 y.o. male with lung cancer admitted on 11/20/2017 with pleural effusion s/p thoracentesis.  Pharmacy has been consulted for Zosyn dosing for post obstructive pneumonia.  Plan: Continue Zosyn 3.375gm IV q8h (4hr extended infusions) No dose adjustments needed, Pharmacy will sign off  Height: 5\' 11"  (180.3 cm) Weight: 254 lb 6.6 oz (115.4 kg) IBW/kg (Calculated) : 75.3  Temp (24hrs), Avg:97.6 F (36.4 C), Min:97.1 F (36.2 C), Max:98.1 F (36.7 C)  Recent Labs  Lab 11/20/17 1612 11/20/17 1656 11/20/17 1934 11/21/17 0727 11/22/17 0316 11/23/17 0316 11/24/17 0338  WBC 34.0*  --   --  29.0* 27.1* 23.0* 21.8*  CREATININE 1.78*  --   --  1.50* 1.66* 1.42* 1.11  LATICACIDVEN  --  2.61* 1.14  --   --   --   --     Estimated Creatinine Clearance: 72 mL/min (by C-G formula based on SCr of 1.11 mg/dL).    Allergies  Allergen Reactions  . Statins Other (See Comments)    Muscle aches - tolerating Vytorin    Antimicrobials this admission:  5/30 Vanc>>6/1 5/31 Zosyn>>  Dose adjustments this admission:  none  Microbiology results:  5/28 UCx: neg FINAL 5/30 MRSA PCR: neg 5/30 bcx x2: ngtd  Thank you for allowing pharmacy to be a part of this patient's care.  Peggyann Juba, PharmD, BCPS Pager: [phone] 11/24/2017 9:24 AM

## 2017-11-25 ENCOUNTER — Inpatient Hospital Stay (HOSPITAL_COMMUNITY): Payer: Medicare Other

## 2017-11-25 ENCOUNTER — Ambulatory Visit: Payer: Medicare Other

## 2017-11-25 ENCOUNTER — Inpatient Hospital Stay: Payer: Medicare Other | Attending: Internal Medicine

## 2017-11-25 ENCOUNTER — Encounter (HOSPITAL_COMMUNITY): Payer: Self-pay | Admitting: Interventional Radiology

## 2017-11-25 HISTORY — PX: IR US GUIDE BX ASP/DRAIN: IMG2392

## 2017-11-25 HISTORY — PX: IR GUIDED DRAIN W CATHETER PLACEMENT: IMG719

## 2017-11-25 LAB — GLUCOSE, CAPILLARY
GLUCOSE-CAPILLARY: 159 mg/dL — AB (ref 65–99)
Glucose-Capillary: 159 mg/dL — ABNORMAL HIGH (ref 65–99)
Glucose-Capillary: 172 mg/dL — ABNORMAL HIGH (ref 65–99)

## 2017-11-25 LAB — CBC
HCT: 30.4 % — ABNORMAL LOW (ref 39.0–52.0)
Hemoglobin: 9.6 g/dL — ABNORMAL LOW (ref 13.0–17.0)
MCH: 26.6 pg (ref 26.0–34.0)
MCHC: 31.6 g/dL (ref 30.0–36.0)
MCV: 84.2 fL (ref 78.0–100.0)
PLATELETS: 190 10*3/uL (ref 150–400)
RBC: 3.61 MIL/uL — ABNORMAL LOW (ref 4.22–5.81)
RDW: 15.5 % (ref 11.5–15.5)
WBC: 26 10*3/uL — ABNORMAL HIGH (ref 4.0–10.5)

## 2017-11-25 LAB — BASIC METABOLIC PANEL
ANION GAP: 9 (ref 5–15)
BUN: 31 mg/dL — ABNORMAL HIGH (ref 6–20)
CALCIUM: 8.7 mg/dL — AB (ref 8.9–10.3)
CO2: 26 mmol/L (ref 22–32)
Chloride: 100 mmol/L — ABNORMAL LOW (ref 101–111)
Creatinine, Ser: 1 mg/dL (ref 0.61–1.24)
Glucose, Bld: 168 mg/dL — ABNORMAL HIGH (ref 65–99)
Potassium: 4.4 mmol/L (ref 3.5–5.1)
SODIUM: 135 mmol/L (ref 135–145)

## 2017-11-25 LAB — CULTURE, BLOOD (ROUTINE X 2)
CULTURE: NO GROWTH
SPECIAL REQUESTS: ADEQUATE

## 2017-11-25 LAB — PROTIME-INR
INR: 1.02
PROTHROMBIN TIME: 13.3 s (ref 11.4–15.2)

## 2017-11-25 LAB — MAGNESIUM: MAGNESIUM: 1.9 mg/dL (ref 1.7–2.4)

## 2017-11-25 LAB — HEPARIN LEVEL (UNFRACTIONATED): Heparin Unfractionated: 0.77 IU/mL — ABNORMAL HIGH (ref 0.30–0.70)

## 2017-11-25 LAB — APTT: APTT: 58 s — AB (ref 24–36)

## 2017-11-25 MED ORDER — LIDOCAINE HCL 1 % IJ SOLN
INTRAMUSCULAR | Status: AC | PRN
  Administered 2017-11-25: 10 mL

## 2017-11-25 MED ORDER — LIDOCAINE HCL (PF) 2 % IJ SOLN
INTRAMUSCULAR | Status: AC
  Filled 2017-11-25: qty 10

## 2017-11-25 MED ORDER — MIDAZOLAM HCL 2 MG/2ML IJ SOLN
INTRAMUSCULAR | Status: AC | PRN
  Administered 2017-11-25: 1 mg via INTRAVENOUS

## 2017-11-25 MED ORDER — HEPARIN (PORCINE) IN NACL 100-0.45 UNIT/ML-% IJ SOLN
1200.0000 [IU]/h | INTRAMUSCULAR | Status: DC
  Administered 2017-11-25: 1400 [IU]/h via INTRAVENOUS
  Administered 2017-11-26 – 2017-11-27 (×2): 1200 [IU]/h via INTRAVENOUS
  Filled 2017-11-25 (×3): qty 250

## 2017-11-25 MED ORDER — MIDAZOLAM HCL 2 MG/2ML IJ SOLN
INTRAMUSCULAR | Status: AC
  Filled 2017-11-25: qty 2

## 2017-11-25 MED ORDER — FENTANYL CITRATE (PF) 100 MCG/2ML IJ SOLN
INTRAMUSCULAR | Status: AC
  Filled 2017-11-25: qty 2

## 2017-11-25 MED ORDER — LEVALBUTEROL HCL 0.63 MG/3ML IN NEBU
0.6300 mg | INHALATION_SOLUTION | RESPIRATORY_TRACT | Status: DC | PRN
  Administered 2017-11-25 – 2017-12-01 (×4): 0.63 mg via RESPIRATORY_TRACT
  Filled 2017-11-25 (×4): qty 3

## 2017-11-25 MED ORDER — FENTANYL CITRATE (PF) 100 MCG/2ML IJ SOLN
INTRAMUSCULAR | Status: AC | PRN
  Administered 2017-11-25: 50 ug via INTRAVENOUS

## 2017-11-25 NOTE — Progress Notes (Signed)
PROGRESS NOTE [facility]   [name].   UXL:[id number] DOB: 1940-12-17  DOA: 11/20/2017 PCP: Crist Infante, MD   Brief Narrative:  Travis Ferguson. is a 77 year old male with medical history significant for NSCLC recently started on chemo and radiation, COPD, OSA, sarcoidosis and chronic pain syndrome presented to the emergency department complaining of worsening of shortness of breath.  On ED evaluation patient was found to have a large pleural effusion, EDP performed thoracentesis at bedside.  Patient was also found to be in A. fib with RVR along with hypotension.  CTA chest was done which shows small pulmonary embolism on the right, with left pleural effusion and near total obstruction of the left main bronchus.  Patient was admitted with working diagnosis of acute on chronic respiratory failure due to malignant pleural effusion complicated with A. fib with RVR. Hospital course complicated by AKI.   Subjective: Patient seen and examined, breathing is okay.  Complaining of cough.  Unable to sleep at night.  Denies chest pain.  Assessment & Plan: Acute on chronic respiratory failure with hypoxia Multifactorial from pleural effusion, Afib with RVR and Post obstructive PNA with severe leukocytosis pulmonary embolism and COPD. Treat underlying causes.   Recurrent left sided malignant pleural effusion  Recently diagnosed with NSCLC S/p Thoracentesis in the ED yield 500 cc  Started radio and chemo this month, follow by Dr Earlie Server - case discussed with Dr. Earlie Server he plan to continue active treatment. Resume radiotherapy while in the hospital. Hold chemotherapy until patient is stronger.  For pleurex cath today,  Awaiting for oncology to discuss prognosis and further treatment plan. Patient may benefit from hospice.    Pulmonary embolism Continue heparin drip, off BiPAP, currently doing well on Berkley. Did well with CPAP. Hopefully can transition to oral A/C in the next 1-2 days.  PCCM not recommending lysis at this time given small anatomic clot burden plus poor prognosis. Continue to monitor closely.  A fib with RVR - much improved  In setting of compressive mass Converted to NSR, patient on Amio and Cardizem, rate better controlled now. Expect rate to have bouts  up to 130's given increase in pressure from mass. Continue cardizem and Amio. Cards have signed off.   Post obstructive PNA  WBC mild increase from yesterday, however patient on steroid, will continue to taper Solu-Medrol as able, continue Zosyn, and de-escalate in AM if remains stable. 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  Continue to monitor closely   COPD/OSA Continue CPAP See above   NSCLC  Started radio and chemo this month, follow by Dr Earlie Server, poor prognosis   Can resume radiation, continue to hold chemo   DVT prophylaxis: Heparin Sq Code Status: DNR Family Communication: None at bedside  Disposition Plan: Transfer to telemetry   Consultants:   Cardiology  PCCM  Procedures:   None  Antimicrobials: Anti-infectives (From admission, onward)   Start     Dose/Rate Route Frequency Ordered Stop   11/22/17 0500  vancomycin (VANCOCIN) 1,250 mg in sodium chloride 0.9 % 250 mL IVPB  Status:  Discontinued     1,250 mg 166.7 mL/hr over 90 Minutes Intravenous Every 24 hours 11/21/17 0911 11/22/17 0919   11/21/17  0600  vancomycin (VANCOCIN) 1,500 mg in sodium chloride 0.9 % 500 mL IVPB  Status:  Discontinued     1,500 mg 250 mL/hr over 120 Minutes Intravenous Every 48 hours 11/20/17 2314 11/21/17 0911   11/21/17 0400  piperacillin-tazobactam (ZOSYN) IVPB 3.375 g     3.375 g 12.5 mL/hr over 240 Minutes  Intravenous Every 8 hours 11/20/17 2314     11/20/17 1800  vancomycin (VANCOCIN) IVPB 1000 mg/200 mL premix     1,000 mg 200 mL/hr over 60 Minutes Intravenous  Once 11/20/17 1750 11/21/17 0036   11/20/17 1800  ceFEPIme (MAXIPIME) 2 g in sodium chloride 0.9 % 100 mL IVPB     2 g 200 mL/hr over 30 Minutes Intravenous  Once 11/20/17 1750 11/20/17 2048      Objective: Vitals:   11/25/17 0358 11/25/17 0553 11/25/17 0722 11/25/17 1221  BP: 112/81   126/78  Pulse: 100   (!) 103  Resp: 18   (!) 21  Temp: 97.6 F (36.4 C)   97.6 F (36.4 C)  TempSrc: Oral   Oral  SpO2: 100%  99%   Weight:  111.8 kg (246 lb 8 oz)    Height:        Intake/Output Summary (Last 24 hours) at 11/25/2017 1332 Last data filed at 11/25/2017 0750 Gross per 24 hour  Intake 182.83 ml  Output 875 ml  Net -692.17 ml   Filed Weights   11/23/17 0600 11/24/17 1735 11/25/17 0553  Weight: 115.4 kg (254 lb 6.6 oz) 112.4 kg (247 lb 12.8 oz) 111.8 kg (246 lb 8 oz)    Examination:  General: Ill looking  Cardiovascular: S1S2 Irr Irr @ 100, no murmurs  Respiratory: Diffuse wheezing, decrease BS in the Left, Abdominal: Soft, NT, ND, bowel sounds + Extremities: Trace LE edema, no cyanosis  Data Reviewed: I have personally reviewed following labs and imaging studies  CBC: Recent Labs  Lab 11/20/17 1612 11/21/17 0727 11/22/17 0316 11/23/17 0316 11/23/17 1523 11/24/17 0338 11/25/17 0231  WBC 34.0* 29.0* 27.1* 23.0*  --  21.8* 26.0*  NEUTROABS 30.3*  --   --   --   --   --   --   HGB 11.9* 10.6* 9.9* 9.5* 9.7* 9.6* 9.6*  HCT 35.6* 32.7* 30.4* 29.4* 30.1* 29.7* 30.4*  MCV 81.3 81.8 81.3 82.4  --  83.2 84.2  PLT 371 298 295 232  --  224 765   Basic Metabolic Panel: Recent Labs  Lab 11/20/17 2317 11/21/17 0727 11/22/17 0316 11/23/17 0316 11/24/17 0338 11/25/17 0231  NA  --  136 134* 134* 136 135  K  --  4.3 4.0 3.9 4.1 4.4  CL  --  103 100* 100* 100* 100*  CO2  --  24 22 23 26 26   GLUCOSE  --  164* 190*  177* 185* 168*  BUN  --  23* 25* 29* 31* 31*  CREATININE  --  1.50* 1.66* 1.42* 1.11 1.00  CALCIUM  --  8.1* 8.4* 8.5* 8.7* 8.7*  MG 1.3*  --  1.7 2.0 1.9 1.9   GFR: Estimated Creatinine Clearance: 78.7 mL/min (by C-G formula based on SCr of 1 mg/dL). Liver Function Tests: Recent Labs  Lab 11/20/17 1612 11/21/17 0727  AST 20 17  ALT 15* 14*  ALKPHOS 100 86  BILITOT 0.4 0.6  PROT 6.3* 5.5*  ALBUMIN 2.9* 2.6*   No results for input(s): LIPASE, AMYLASE in the last 168 hours. No results for input(s): AMMONIA  in the last 168 hours. Coagulation Profile: Recent Labs  Lab 11/25/17 0231  INR 1.02   Cardiac Enzymes: Recent Labs  Lab 11/20/17 2317 11/21/17 0727 11/21/17 1043  TROPONINI 0.03* <0.03 0.03*   BNP (last 3 results) Recent Labs    08/08/17 0958  PROBNP 92.0   HbA1C: No results for input(s): HGBA1C in the last 72 hours. CBG: Recent Labs  Lab 11/23/17 2310 11/24/17 0757 11/24/17 1602 11/25/17 0043 11/25/17 0746  GLUCAP 168* 162* 164* 172* 159*   Lipid Profile: No results for input(s): CHOL, HDL, LDLCALC, TRIG, CHOLHDL, LDLDIRECT in the last 72 hours. Thyroid Function Tests: No results for input(s): TSH, T4TOTAL, FREET4, T3FREE, THYROIDAB in the last 72 hours. Anemia Panel: No results for input(s): VITAMINB12, FOLATE, FERRITIN, TIBC, IRON, RETICCTPCT in the last 72 hours. Sepsis Labs: Recent Labs  Lab 11/20/17 1656 11/20/17 1934 11/21/17 1043 11/22/17 0316  PROCALCITON  --   --  0.46 0.40  LATICACIDVEN 2.61* 1.14  --   --     Recent Results (from the past 240 hour(s))  Urine Culture     Status: None   Collection Time: 11/18/17 11:59 AM  Result Value Ref Range Status   Specimen Description   Final    URINE, CLEAN CATCH Performed at [facility], [place] [address] [place], [place] [address]    Special Requests   Final    NONE Performed at [facility], [place] [address], [place], [place]  [address]    Culture   Final    NO GROWTH Performed at [facility], [place] [address] [place], [place] [address]    Report Status 11/19/2017 FINAL  Final  Blood culture (routine x 2)     Status: None (Preliminary result)   Collection Time: 11/20/17  4:49 PM  Result Value Ref Range Status   Specimen Description   Final    BLOOD RIGHT ANTECUBITAL Performed at [facility] [address] [place], [place] [address]    Special Requests   Final    BOTTLES DRAWN AEROBIC AND ANAEROBIC Blood Culture adequate volume Performed at [facility] [address], [place], [place] [address]    Culture   Final    NO GROWTH 4 DAYS Performed at [facility], [place] [address], [place], [place] [address]    Report Status PENDING  Incomplete  MRSA PCR Screening     Status: None   Collection Time: 11/20/17 11:02 PM  Result Value Ref Range Status   MRSA by PCR NEGATIVE NEGATIVE Final    Comment:        The GeneXpert MRSA Assay (FDA approved for NASAL specimens only), is one component of a comprehensive MRSA colonization surveillance program. It is not intended to diagnose MRSA infection nor to guide or monitor treatment for MRSA infections. Performed at [facility], [place] [address] [place], [place] [address]   Blood culture (routine x 2)     Status: None (Preliminary result)   Collection Time: 11/20/17 11:17 PM  Result Value Ref Range Status   Specimen Description   Final    BLOOD RIGHT ANTECUBITAL Performed at [facility] [address] [place], [place] [address]    Special Requests   Final    BOTTLES DRAWN AEROBIC AND ANAEROBIC Blood Culture adequate volume Performed at [facility] [address], [place],  [address]    Culture  Final    NO GROWTH 3 DAYS Performed at [facility], [place] [address] [place], [place] [address]    Report Status PENDING  Incomplete       Radiology Studies: [place] Chest (pleural Effusion)  Result Date: 11/24/2017 CLINICAL DATA:  Pleural effusion on CT and chest radiograph EXAM: CHEST ULTRASOUND COMPARISON:  CT 11/20/2017 chest radiograph 12/21/2017 FINDINGS: Large LEFT pleural effusion IMPRESSION: Large LEFT pleural effusion. Electronically Signed   By: Suzy Bouchard M.D.   On: 11/24/2017 17:18   Dg Chest Port 1 View  Result Date: 11/24/2017 CLINICAL DATA:  Follow-up left pleural effusion. EXAM: PORTABLE CHEST 1 VIEW COMPARISON:  Portable chest x-ray of November 22, 2017 FINDINGS: There remains near total opacification of the left hemithorax. Marked mediastinal shift is not observed though mild shift may be present. A tiny amount of aerated lung remains in the apex. The right lung is hypoinflated but grossly clear. 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 IMPRESSION: Persistent near-total opacification of the left hemithorax due to postobstructive atelectasis or effusion. Electronically Signed   By: David  Martinique M.D.   On: 11/24/2017 10:03    Scheduled Meds: . amiodarone  400 mg Oral BID  . arformoterol  15 mcg Nebulization BID  . atorvastatin  20 mg Oral q1800  . budesonide (PULMICORT) nebulizer solution  0.25 mg Nebulization BID  . diltiazem  60 mg Oral Q6H  . DULoxetine  30 mg Oral Daily  . FLUoxetine  40 mg Oral Q breakfast  . guaiFENesin  600 mg Oral BID  . levalbuterol  0.63 mg Nebulization Q6H  . methylPREDNISolone (SOLU-MEDROL) injection  60 mg Intravenous Q12H   Continuous Infusions: . piperacillin-tazobactam (ZOSYN)  IV 3.375 g (11/25/17 1221)     LOS: 5 days    Time spent: Total of 25 minutes spent with pt, greater than 50% of which was spent in discussion of  treatment, counseling and coordination of care   Chipper Oman, MD Pager: Text Page via [email or web address]   If 7PM-7AM, please contact night-coverage [email or web address] 11/25/2017, 1:32 PM   Note - This record has been created using [employer].  Chart creation errors have been sought, but may not always have been located. Such creation errors do not reflect on the standard of medical care.

## 2017-11-25 NOTE — Progress Notes (Signed)
ANTICOAGULATION CONSULT NOTE - Follow Up Consult  Pharmacy Consult for Heparin Indication: hx  atrial fibrillation; suspects new acute PE (home Eliquis on hold)   Allergies  Allergen Reactions  . Statins Other (See Comments)    Muscle aches - tolerating Vytorin    Patient Measurements: Height: 5\' 11"  (180.3 cm) Weight: 246 lb 8 oz (111.8 kg) IBW/kg (Calculated) : 75.3 Heparin Dosing Weight:   Vital Signs: Temp: 97.6 F (36.4 C) (06/04 0358) Temp Source: Oral (06/04 0358) BP: 112/81 (06/04 0358) Pulse Rate: 100 (06/04 0358)  Labs: Recent Labs    11/22/17 1340  11/23/17 0316 11/23/17 1523 11/24/17 0338 11/25/17 0231  HGB  --    < > 9.5* 9.7* 9.6* 9.6*  HCT  --    < > 29.4* 30.1* 29.7* 30.4*  PLT  --   --  232  --  224 190  APTT 66*  --  74*  --   --  58*  LABPROT  --   --   --   --   --  13.3  INR  --   --   --   --   --  1.02  HEPARINUNFRC  --   --  1.66*  --   --  0.77*  CREATININE  --   --  1.42*  --  1.11 1.00   < > = values in this interval not displayed.    Estimated Creatinine Clearance: 78.7 mL/min (by C-G formula based on SCr of 1 mg/dL).   Medications:  Infusions:  . piperacillin-tazobactam (ZOSYN)  IV 3.375 g (11/25/17 0415)    Assessment: Patient with low PTT but high heparin level.  The heparin level still appears to be elevated due to prior apixaban use. PTT ordered with Heparin level until both correlate due to possible drug-lab interaction between oral anticoagulant (rivaroxaban, edoxaban, or apixaban) and anti-Xa level (aka heparin level)  However, per RN patient still with blood in urine.  PTTs on same rate was a goal.  Therefore, will continue heparin at current rate until planned d/c time of 0600.    Goal of Therapy:  Heparin level 0.3-0.7 units/ml Monitor platelets by anticoagulation protocol: Yes   Plan:  Continue heparin drip at current rate, d/c drip at 0600 as per prior order--done. Follow up with coag plans after  procedure.  Tyler Deis, Shea Stakes [place] 11/25/2017,6:07 AM

## 2017-11-25 NOTE — Procedures (Signed)
L pleurx drain 1200 cc tapped EBL 0 Comp 0

## 2017-11-25 NOTE — Progress Notes (Signed)
Patient continues with bloody urine overnight. Patient has also had 3 loose stools overnight. Will continue to monitor patient.

## 2017-11-25 NOTE — Progress Notes (Signed)
Referring Physician(s): Mohamed,M/Zapata,E Quincy Simmonds  Supervising Physician: Marybelle Killings  Patient Status:  [facility] - In-pt  Chief Complaint: Dyspnea, recurrent left pleural effusion, lung cancer   Subjective: Patient familiar to IR service from prior percutaneous cholecystostomy in 2016 as well as pericholecystic fluid drain placement.  He has also undergone 2 left thoracenteses in IR on 10/09/2017 and again on 10/24/2017 yielding 1.3 L.  His last left thoracentesis in the ED yielded 500 cc on 11/20/2017.  He has a known history of stage IV non-small cell lung cancer, COPD, sarcoid, obstructive sleep apnea and recent pulmonary embolus.  Request now received for left Pleurx catheter placement for recurrent left pleural effusions. Past Medical History:  Diagnosis Date  . Anxiety   . Arthritis    "about q joint I've got" (09/22/2015)  . Atherosclerosis   . CAP (community acquired pneumonia) 02/24/2015  . Cervical disc disease   . Chronic combined systolic and diastolic CHF (congestive heart failure) ([place])    a. prior EF 20-25% in 2016 ([facility]), improved to 50-55% in 08/2017.  [name] Chronic lower back pain    chronic back pain/under pain management  . Colon polyps 08/02/2014   Tubular adenoma x 3, Hyperplastic-1  . COPD (chronic obstructive pulmonary disease) ([place])    a. Former Airline pilot, also smoked a pipe.  . Depression   . Dyslipidemia   . Dyspnea    chronic  . GERD (gastroesophageal reflux disease)   . Hiatal hernia    sensation problem ("right lateral side hip to knee").  . History of blood transfusion "numerous"  . History of oxygen administration    @ 2 l/m nasally at bedtime.(09/22/2015)  . Hypertension   . Inguinal hernia    right side  . Migraine    "years since I've had one" (09/22/2015)  . Mild CAD    a. nonobstructive CAD by cath 2016.  [name] OSA (obstructive sleep apnea)    does not use CPAP; "just oxygen" (09/22/2015)  . PAF (paroxysmal atrial fibrillation) ([place])   .  Paroxysmal atrial flutter ([place])   . PTSD (post-traumatic stress disorder)   . Recurrent pleural effusion on left   . Rhinitis   . Sarcoidosis   . Sensation problem    right side-lateral hip to knee" decreased sensation and tingling feeling" -has informed Dr. Joylene Draft, Dr. Henrene Pastor, Dr. Lucia Gaskins of this.  . Sepsis ([place])   . Squamous cell lung cancer ([place])   . Steroid-induced hyperglycemia 02/25/2015  . Subdural hematoma ([place]) 11/10   a. 2010 s/p surgery - diagnosed several weeks after a fall.   Past Surgical History:  Procedure Laterality Date  . BACK SURGERY    . BRAIN SURGERY  04/2009   right frontal"hemorrhage evacuation from fall injury"  . CARDIAC CATHETERIZATION N/A 01/23/2015   Procedure: Right/Left Heart Cath and Coronary Angiography;  Surgeon: Lorretta Harp, MD;  Location: [facility];  Service: Cardiovascular;  Laterality: N/A;  . CHOLECYSTECTOMY N/A 11/08/2014   Procedure: LAPAROSCOPIC CHOLECYSTECTOMY ;  Surgeon: Alphonsa Overall, MD;  Location: [facility];  Service: General;  Laterality: N/A;  . ERCP N/A 11/07/2014   Procedure: ENDOSCOPIC RETROGRADE CHOLANGIOPANCREATOGRAPHY (ERCP);  Surgeon: Irene Shipper, MD;  Location: [facility];  Service: Endoscopy;  Laterality: N/A;  . GALLBLADDER SURGERY  09/2014   drain tube placed  . INGUINAL HERNIA REPAIR Left   . KNEE ARTHROSCOPY Right   . LUMBAR LAMINECTOMY/DECOMPRESSION MICRODISCECTOMY Right 12/15/2012   Procedure: LUMBAR LAMINECTOMY/DECOMPRESSION MICRODISCECTOMY 1 LEVEL;  Surgeon:  Elaina Hoops, MD;  Location: [facility];  Service: Neurosurgery;  Laterality: Right;  Right Lumbar four-five laminectomy/foraminotomy  . REVERSE SHOULDER ARTHROPLASTY Left 09/22/2015   Procedure: LEFT REVERSE TOTAL SHOULDER ARTHROPLASTY;  Surgeon: Netta Cedars, MD;  Location: [facility];  Service: Orthopedics;  Laterality: Left;  . REVERSE TOTAL SHOULDER ARTHROPLASTY Left 09/22/2015  . VIDEO BRONCHOSCOPY  07/28/2012   Procedure: VIDEO BRONCHOSCOPY WITH FLUORO;   Surgeon: Chesley Mires, MD;  Location: [facility];  Service: Cardiopulmonary;  Laterality: Bilateral;  . VIDEO BRONCHOSCOPY Bilateral 10/17/2017   Procedure: VIDEO BRONCHOSCOPY WITH FLUORO;  Surgeon: Chesley Mires, MD;  Location: [facility];  Service: Cardiopulmonary;  Laterality: Bilateral;     Allergies: Statins  Medications: Prior to Admission medications   Medication Sig Start Date End Date Taking? Authorizing Provider  albuterol (PROVENTIL HFA;VENTOLIN HFA) 108 (90 Base) MCG/ACT inhaler 1-2 PUFFS EVERY 4-6 HOURS AS NEEDED FOR SHORTNESS OF BREATH 10/20/17  Yes Chesley Mires, MD  amiodarone (PACERONE) 100 MG tablet Take 1 tablet (100 mg total) by mouth daily. 10/22/17  Yes Kilroy, Doreene Burke, PA-C  apixaban (ELIQUIS) 5 MG TABS tablet Take 1 tablet (5 mg total) by mouth 2 (two) times daily. 10/25/17  Yes Patrecia Pour, Christean Grief, MD  atorvastatin (LIPITOR) 20 MG tablet Take 1 tablet (20 mg total) by mouth daily at 6 PM. 09/08/17  Yes Dessa Phi, DO  benzonatate (TESSALON) 200 MG capsule Take 1 capsule (200 mg total) by mouth 3 (three) times daily as needed for cough. 09/22/17  Yes Chesley Mires, MD  budesonide-formoterol (SYMBICORT) 160-4.5 MCG/ACT inhaler Inhale 2 puffs into the lungs 2 (two) times daily. 07/15/17  Yes Parrett, Tammy S, NP  cholecalciferol (VITAMIN D) 1000 units tablet Take 2,000 Units by mouth daily.   Yes [provider]  DULoxetine (CYMBALTA) 30 MG capsule Take 30 mg by mouth daily.   Yes [provider]  ENTRESTO 24-26 MG TAKE 1 TABLET BY MOUTH 2 (TWO) TIMES DAILY. 05/14/16  Yes Lorretta Harp, MD  finasteride (PROSCAR) 5 MG tablet Take 5 mg by mouth daily.  01/07/13  Yes [provider]  FLUoxetine (PROZAC) 40 MG capsule Take 40 mg by mouth daily with breakfast.    Yes [provider]  guaiFENesin (MUCINEX) 600 MG 12 hr tablet Take 1 tablet (600 mg total) by mouth 2 (two) times daily. 10/13/17 10/13/18 Yes Florencia Reasons, MD  HYDROcodone-acetaminophen  (NORCO/VICODIN) 5-325 MG tablet Take 1 tablet by mouth every 8 (eight) hours as needed for moderate pain.    Yes [provider]  Loperamide HCl (IMODIUM A-D PO) Take 2 tablets by mouth daily as needed (diarrhea).   Yes [provider]  nystatin (MYCOSTATIN/NYSTOP) powder APPLY TO AFFECTED AREA 3 TIMES A DAY 10/30/17  Yes [provider]  OXYGEN Inhale 2 L/min into the lungs at bedtime.   Yes [provider]  Probiotic Product (PROBIOTIC PO) Take 1 tablet by mouth daily.   Yes [provider]  prochlorperazine (COMPAZINE) 10 MG tablet Take 1 tablet (10 mg total) by mouth every 6 (six) hours as needed for nausea or vomiting. 11/04/17  Yes Curt Bears, MD  sulfamethoxazole-trimethoprim (BACTRIM DS,SEPTRA DS) 800-160 MG tablet Take 1 tablet by mouth 2 (two) times daily. 11/18/17  Yes Alla Feeling, NP  furosemide (LASIX) 20 MG tablet Take 1 tablet (20 mg total) by mouth 2 (two) times daily. Patient not taking: Reported on 11/20/2017 09/08/17   Dessa Phi, DO  ipratropium (ATROVENT) 0.02 %  nebulizer solution Take 2.5 mLs (0.5 mg total) by nebulization 4 (four) times daily. Patient not taking: Reported on 11/20/2017 07/15/17   Parrett, Fonnie Mu, NP  levalbuterol (XOPENEX) 1.25 MG/0.5ML nebulizer solution Take 1.25 mg by nebulization every 8 (eight) hours as needed for wheezing or shortness of breath. Patient not taking: Reported on 11/20/2017 10/13/17   Florencia Reasons, MD  metoprolol tartrate (LOPRESSOR) 25 MG tablet TAKE 1 TABLET (25 MG TOTAL) BY MOUTH 2 (TWO) TIMES DAILY. Patient not taking: Reported on 11/20/2017 09/11/15   Lorretta Harp, MD     Vital Signs: BP 112/81 (BP Location: Right Arm)   Pulse 100   Temp 97.6 F (36.4 C) (Oral)   Resp 18   Ht 5\' 11"  (1.803 m)   Wt 246 lb 8 oz (111.8 kg)   SpO2 99%   BMI 34.38 kg/m   Physical Exam awake, alert.  Chest with scattered wheezes throughout.  Heart with normal rate, occasional ectopy noted.   Abdomen soft, positive bowel sounds, nontender.  Imaging: [place] Chest (pleural Effusion)  Result Date: 11/24/2017 CLINICAL DATA:  Pleural effusion on CT and chest radiograph EXAM: CHEST ULTRASOUND COMPARISON:  CT 11/20/2017 chest radiograph 12/21/2017 FINDINGS: Large LEFT pleural effusion IMPRESSION: Large LEFT pleural effusion. Electronically Signed   By: Suzy Bouchard M.D.   On: 11/24/2017 17:18   Dg Chest Port 1 View  Result Date: 11/24/2017 CLINICAL DATA:  Follow-up left pleural effusion. EXAM: PORTABLE CHEST 1 VIEW COMPARISON:  Portable chest x-ray of November 22, 2017 FINDINGS: There remains near total opacification of the left hemithorax. Marked mediastinal shift is not observed though mild shift may be present. A tiny amount of aerated lung remains in the apex. The right lung is hypoinflated but grossly clear. 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 IMPRESSION: Persistent near-total opacification of the left hemithorax due to postobstructive atelectasis or effusion. Electronically Signed   By: David  Martinique M.D.   On: 11/24/2017 10:03   Dg Chest Port 1 View  Result Date: 11/22/2017 CLINICAL DATA:  Acute respiratory failure.  Followup exam. EXAM: PORTABLE CHEST 1 VIEW COMPARISON:  11/21/2017 and older exams. FINDINGS: Complete opacification of the left hemithorax is unchanged from the most recent prior exam. Right lung is clear. No pneumothorax. IMPRESSION: 1. No change from the most recent prior exam. Persistent complete opacification of the left hemithorax. Right lung remains clear. Electronically Signed   By: Lajean Manes M.D.   On: 11/22/2017 07:35    Labs:  CBC: Recent Labs    11/22/17 0316 11/23/17 0316 11/23/17 1523 11/24/17 0338 11/25/17 0231  WBC 27.1* 23.0*  --  21.8* 26.0*  HGB 9.9* 9.5* 9.7* 9.6* 9.6*  HCT 30.4* 29.4* 30.1* 29.7* 30.4*  PLT 295 232  --  224 190    COAGS: Recent Labs    10/23/17 1513  11/22/17 0316 11/22/17 1340 11/23/17 0316 11/25/17 0231   INR 1.00  --   --   --   --  1.02  APTT  --    < > 66* 66* 74* 58*   < > = values in this interval not displayed.    BMP: Recent Labs    11/22/17 0316 11/23/17 0316 11/24/17 0338 11/25/17 0231  NA 134* 134* 136 135  K 4.0 3.9 4.1 4.4  CL 100* 100* 100* 100*  CO2 22 23 26 26   GLUCOSE 190* 177* 185* 168*  BUN 25* 29* 31* 31*  CALCIUM 8.4* 8.5* 8.7* 8.7*  CREATININE  1.66* 1.42* 1.11 1.00  GFRNONAA 38* 46* >60 >60  GFRAA 44* 53* >60 >60    LIVER FUNCTION TESTS: Recent Labs    11/10/17 0812 11/18/17 0934 11/20/17 1612 11/21/17 0727  BILITOT 0.7 0.5 0.4 0.6  AST 12 18 20 17   ALT 11 15 15* 14*  ALKPHOS 81 86 100 86  PROT 6.4 6.0* 6.3* 5.5*  ALBUMIN 2.5* 2.6* 2.9* 2.6*    Assessment and Plan: Pt with hx stage IV non-small cell lung cancer, COPD, sarcoid, obstructive sleep apnea and recent pulmonary embolus.  He also has a history of recurrent left pleural effusions and has undergone a total of 3 thoracenteses.  Limited ultrasound of posterior left chest yesterday revealed a moderate effusion.  Request now received for left Pleurx catheter placement for recurrent left pleural effusions.  Imaging studies have been reviewed by Dr. Barbie Banner.  Details/risks of procedure, including but not limited to, internal bleeding, pneumothorax, infection, injury to adjacent structures discussed with patient and wife with their understanding and consent.  Procedure scheduled for later today.    Electronically Signed: D. Rowe Robert, PA-C 11/25/2017, 9:27 AM   I spent a total of 25 minutes at the the patient's bedside AND on the patient's hospital floor or unit, greater than 50% of which was counseling/coordinating care for left Pleurx catheter placement    Patient ID: Travis Bowl., male   DOB: January 14, 1941, 77 y.o.   MRN: [id number]

## 2017-11-25 NOTE — Progress Notes (Signed)
ANTICOAGULATION CONSULT NOTE - Follow Up Consult  Pharmacy Consult for Heparin Indication: hx  atrial fibrillation; suspects new acute PE (home Eliquis on hold)   Allergies  Allergen Reactions  . Statins Other (See Comments)    Muscle aches - tolerating Vytorin    Patient Measurements: Height: 5\' 11"  (180.3 cm) Weight: 246 lb 8 oz (111.8 kg) IBW/kg (Calculated) : 75.3 Heparin Dosing Weight: 99 kg  Vital Signs: Temp: 97.6 F (36.4 C) (06/04 1221) Temp Source: Oral (06/04 1221) BP: 124/82 (06/04 1713) Pulse Rate: 101 (06/04 1555)  Labs: Recent Labs    11/23/17 0316 11/23/17 1523 11/24/17 0338 11/25/17 0231  HGB 9.5* 9.7* 9.6* 9.6*  HCT 29.4* 30.1* 29.7* 30.4*  PLT 232  --  224 190  APTT 74*  --   --  58*  LABPROT  --   --   --  13.3  INR  --   --   --  1.02  HEPARINUNFRC 1.66*  --   --  0.77*  CREATININE 1.42*  --  1.11 1.00    Estimated Creatinine Clearance: 78.7 mL/min (by C-G formula based on SCr of 1 mg/dL).   Medications:  Infusions:  . heparin    . piperacillin-tazobactam (ZOSYN)  IV Stopped (11/25/17 1718)    Assessment: Patient was taking apixaban prior to admission for atrial fibrillation. Anticoagulation was converted to heparin drip inpatient for the treatment of a newly diagnosed PE. Heparin level has been elevated due to effects of apixaban on lab value - have been monitoring using APTT.  Today, 11/25/17   Heparin drip was stopped at 0600 this morning. Pleurx drain was placed this afternoon  Spoke with hospitalist who would like to resume heparin drip this evening. Per IR MD on call, can resume 4 hours post-procedure at previous rate with no bolus.   Goal of Therapy:  Heparin level 0.3-0.7 units/ml APTT goal 66-102 seconds Monitor platelets by anticoagulation protocol: Yes   Plan:   Will resume heparin 1400 units/hr @ 2015 this evening with no bolus (4 hours post-procedure note)  Will check CBC daily  Will order 8 hour APTT and  HL  Monitor for signs/symptoms of bleeding  APTT ordered with Heparin level until both correlate due to possible drug-lab interaction between oral anticoagulant (rivaroxaban, edoxaban, or apixaban) and anti-Xa level (aka heparin level)   Theodis Shove, PharmD, BCPS Clinical Pharmacist Pager: [phone] 11/25/17 7:06 PM

## 2017-11-26 ENCOUNTER — Encounter (HOSPITAL_COMMUNITY): Payer: Self-pay

## 2017-11-26 ENCOUNTER — Ambulatory Visit: Payer: Medicare Other

## 2017-11-26 LAB — CULTURE, BLOOD (ROUTINE X 2)
Culture: NO GROWTH
SPECIAL REQUESTS: ADEQUATE

## 2017-11-26 LAB — BASIC METABOLIC PANEL
Anion gap: 8 (ref 5–15)
BUN: 34 mg/dL — ABNORMAL HIGH (ref 6–20)
CO2: 30 mmol/L (ref 22–32)
Calcium: 8.7 mg/dL — ABNORMAL LOW (ref 8.9–10.3)
Chloride: 100 mmol/L — ABNORMAL LOW (ref 101–111)
Creatinine, Ser: 1.02 mg/dL (ref 0.61–1.24)
GFR calc Af Amer: >60 mL/min (ref >60)
GLUCOSE: 181 mg/dL — AB (ref 65–99)
POTASSIUM: 4.4 mmol/L (ref 3.5–5.1)
Sodium: 138 mmol/L (ref 135–145)

## 2017-11-26 LAB — HEPARIN LEVEL (UNFRACTIONATED)
Heparin Unfractionated: 0.69 IU/mL (ref 0.30–0.70)
Heparin Unfractionated: 0.77 IU/mL — ABNORMAL HIGH (ref 0.30–0.70)
Heparin Unfractionated: 0.61 IU/mL (ref 0.30–0.70)

## 2017-11-26 LAB — CBC
HCT: 30 % — ABNORMAL LOW (ref 39.0–52.0)
Hemoglobin: 9.4 g/dL — ABNORMAL LOW (ref 13.0–17.0)
MCH: 26.6 pg (ref 26.0–34.0)
MCHC: 31.3 g/dL (ref 30.0–36.0)
MCV: 85 fL (ref 78.0–100.0)
PLATELETS: 170 10*3/uL (ref 150–400)
RBC: 3.53 MIL/uL — ABNORMAL LOW (ref 4.22–5.81)
RDW: 15.6 % — AB (ref 11.5–15.5)
WBC: 20.9 10*3/uL — ABNORMAL HIGH (ref 4.0–10.5)

## 2017-11-26 LAB — APTT
aPTT: 63 seconds — ABNORMAL HIGH (ref 24–36)
aPTT: 72 seconds — ABNORMAL HIGH (ref 24–36)

## 2017-11-26 LAB — GLUCOSE, CAPILLARY
GLUCOSE-CAPILLARY: 172 mg/dL — AB (ref 65–99)
GLUCOSE-CAPILLARY: 191 mg/dL — AB (ref 65–99)
Glucose-Capillary: 215 mg/dL — ABNORMAL HIGH (ref 65–99)
Glucose-Capillary: 217 mg/dL — ABNORMAL HIGH (ref 65–99)

## 2017-11-26 LAB — MAGNESIUM: Magnesium: 2.1 mg/dL (ref 1.7–2.4)

## 2017-11-26 MED ORDER — MORPHINE SULFATE (PF) 2 MG/ML IV SOLN
2.0000 mg | INTRAVENOUS | Status: DC | PRN
  Administered 2017-12-01 (×2): 2 mg via INTRAVENOUS
  Filled 2017-11-26: qty 2

## 2017-11-26 MED ORDER — MORPHINE SULFATE (PF) 2 MG/ML IV SOLN
2.0000 mg | INTRAVENOUS | Status: DC | PRN
  Administered 2017-11-26: 4 mg via INTRAVENOUS
  Filled 2017-11-26: qty 2

## 2017-11-26 MED ORDER — HYDROCODONE-ACETAMINOPHEN 5-325 MG PO TABS
1.0000 | ORAL_TABLET | ORAL | Status: DC | PRN
  Administered 2017-11-26 – 2017-11-28 (×6): 1 via ORAL
  Administered 2017-11-29: 2 via ORAL
  Filled 2017-11-26 (×4): qty 1
  Filled 2017-11-26: qty 2
  Filled 2017-11-26 (×2): qty 1

## 2017-11-26 MED ORDER — SODIUM CHLORIDE 0.9 % IV BOLUS
250.0000 mL | Freq: Once | INTRAVENOUS | Status: AC
  Administered 2017-11-26: 250 mL via INTRAVENOUS

## 2017-11-26 MED ORDER — IPRATROPIUM BROMIDE 0.02 % IN SOLN
0.5000 mg | Freq: Four times a day (QID) | RESPIRATORY_TRACT | Status: DC
  Administered 2017-11-26 – 2017-12-01 (×19): 0.5 mg via RESPIRATORY_TRACT
  Filled 2017-11-26 (×19): qty 2.5

## 2017-11-26 MED ORDER — DILTIAZEM HCL 90 MG PO TABS
90.0000 mg | ORAL_TABLET | Freq: Four times a day (QID) | ORAL | Status: DC
  Administered 2017-11-26 – 2017-11-27 (×4): 90 mg via ORAL
  Filled 2017-11-26 (×4): qty 1

## 2017-11-26 MED ORDER — MORPHINE SULFATE (PF) 4 MG/ML IV SOLN
4.0000 mg | Freq: Once | INTRAVENOUS | Status: AC
  Administered 2017-11-26: 4 mg via INTRAVENOUS
  Filled 2017-11-26: qty 1

## 2017-11-26 MED ORDER — METOPROLOL TARTRATE 5 MG/5ML IV SOLN
INTRAVENOUS | Status: AC
  Filled 2017-11-26: qty 5

## 2017-11-26 MED ORDER — METOPROLOL TARTRATE 5 MG/5ML IV SOLN
5.0000 mg | Freq: Once | INTRAVENOUS | Status: AC
  Administered 2017-11-26: 5 mg via INTRAVENOUS

## 2017-11-26 NOTE — Progress Notes (Signed)
ANTICOAGULATION CONSULT NOTE   Pharmacy Consult for heparin Indication:hx  atrial fibrillation; suspects new acute PE (home Eliquis on hold)  Allergies  Allergen Reactions  . Statins Other (See Comments)    Muscle aches - tolerating Vytorin    Patient Measurements: Height: 5\' 11"  (180.3 cm) Weight: 246 lb 0.5 oz (111.6 kg) IBW/kg (Calculated) : 75.3 Heparin Dosing Weight: 99 kg  Vital Signs: Temp: 97.7 F (36.5 C) (06/05 1158) Temp Source: Oral (06/05 1158) BP: 120/84 (06/05 1155) Pulse Rate: 134 (06/05 1155)  Labs: Recent Labs    11/24/17 0338 11/25/17 0231 11/26/17 0419 11/26/17 1204  HGB 9.6* 9.6* 9.4*  --   HCT 29.7* 30.4* 30.0*  --   PLT 224 190 170  --   APTT  --  58* 63* 72*  LABPROT  --  13.3  --   --   INR  --  1.02  --   --   HEPARINUNFRC  --  0.77* 0.69 0.77*  CREATININE 1.11 1.00 1.02  --     Estimated Creatinine Clearance: 77 mL/min (by C-G formula based on SCr of 1.02 mg/dL).   Medications:  - Eliquis 5 mg bid PTA (last dose taken on 5/30 at 11a)  Assessment: Patient's a 77 y.o M with hx lung cancer and afib on Eliquis PTA, presented to the ED on 5/30 with c/o SOB and back pain.  Chest CTA on 5/30 showed moderate L pleural effusion (thoracentesis performed in the ED at ~6p) and findings with suspicion for small RLL PE.  Eliquis placed on hold and patient was started on heparin drip for afib and PE.   Significant Events: 6/2 Heparin gtt turned off for hematuria 6/4 Heparin gtt turned off for Pleurx drain placement  Today, 11/26/2017:  Heparin level above goal (0.77) on 1400 units/hr.  Given that patient has not had DOAC in 7 days & heparin level/aPTT moving in same direction will stop checking aPTT levels & adjust heparin infusion solely on heparin levels.    CBC: Hgb stable at 9.6, pltc WNL  Patient still with hematuria which is slightly improved per nursing.   Goal of Therapy:  Heparin level 0.3-0.7 units/ml aPTT 66-102 seconds Monitor  platelets by anticoagulation protocol: Yes   Plan:  - Decrease heparin drip to 1200 units/hr - Recheck 8h heparin level - monitor for s/s bleeding - F/U long-term anticoagulation plan  Netta Cedars, PharmD, BCPS Pager: [phone] 11/26/2017 1:22 PM

## 2017-11-26 NOTE — Progress Notes (Signed)
ANTICOAGULATION CONSULT NOTE   Pharmacy Consult for heparin Indication:hx  atrial fibrillation; suspects new acute PE (home Eliquis on hold)  Please see progress note from Netta Cedars, PharmD for full details.  IV heparin rate reduced earlier today for elevated heparin level.  Repeat heparin level = 0.61 (therapeutic) on 1200 units/hr Per discussion with RN, urine remains pink/amber but not worsened from earlier today  Plan: - Continue heparin at current rate - F/u heparin level in AM  Peggyann Juba, PharmD, BCPS Pager: [phone] 11/26/2017 8:44 PM

## 2017-11-26 NOTE — Progress Notes (Signed)
Referring Physician(s): Mohamed,M/Silva Zapata,E  Supervising Physician: Daryll Brod  Patient Status:  [facility] - In-pt  Chief Complaint: Dyspnea, recurrent left pleural effusion, lung cancer     Subjective: Pt states he is breathing a little better since pleurx placed yesterday; still with sig fatigue, arm tremors, occ hiccups   Allergies: Statins  Medications: Prior to Admission medications   Medication Sig Start Date End Date Taking? Authorizing Provider  albuterol (PROVENTIL HFA;VENTOLIN HFA) 108 (90 Base) MCG/ACT inhaler 1-2 PUFFS EVERY 4-6 HOURS AS NEEDED FOR SHORTNESS OF BREATH 10/20/17  Yes Chesley Mires, MD  amiodarone (PACERONE) 100 MG tablet Take 1 tablet (100 mg total) by mouth daily. 10/22/17  Yes Kilroy, Doreene Burke, PA-C  apixaban (ELIQUIS) 5 MG TABS tablet Take 1 tablet (5 mg total) by mouth 2 (two) times daily. 10/25/17  Yes Patrecia Pour, Christean Grief, MD  atorvastatin (LIPITOR) 20 MG tablet Take 1 tablet (20 mg total) by mouth daily at 6 PM. 09/08/17  Yes Dessa Phi, DO  benzonatate (TESSALON) 200 MG capsule Take 1 capsule (200 mg total) by mouth 3 (three) times daily as needed for cough. 09/22/17  Yes Chesley Mires, MD  budesonide-formoterol (SYMBICORT) 160-4.5 MCG/ACT inhaler Inhale 2 puffs into the lungs 2 (two) times daily. 07/15/17  Yes Parrett, Tammy S, NP  cholecalciferol (VITAMIN D) 1000 units tablet Take 2,000 Units by mouth daily.   Yes [provider]  DULoxetine (CYMBALTA) 30 MG capsule Take 30 mg by mouth daily.   Yes [provider]  ENTRESTO 24-26 MG TAKE 1 TABLET BY MOUTH 2 (TWO) TIMES DAILY. 05/14/16  Yes Lorretta Harp, MD  finasteride (PROSCAR) 5 MG tablet Take 5 mg by mouth daily.  01/07/13  Yes [provider]  FLUoxetine (PROZAC) 40 MG capsule Take 40 mg by mouth daily with breakfast.    Yes [provider]  guaiFENesin (MUCINEX) 600 MG 12 hr tablet Take 1 tablet (600 mg total) by mouth 2 (two) times daily. 10/13/17  10/13/18 Yes Florencia Reasons, MD  HYDROcodone-acetaminophen (NORCO/VICODIN) 5-325 MG tablet Take 1 tablet by mouth every 8 (eight) hours as needed for moderate pain.    Yes [provider]  Loperamide HCl (IMODIUM A-D PO) Take 2 tablets by mouth daily as needed (diarrhea).   Yes [provider]  nystatin (MYCOSTATIN/NYSTOP) powder APPLY TO AFFECTED AREA 3 TIMES A DAY 10/30/17  Yes [provider]  OXYGEN Inhale 2 L/min into the lungs at bedtime.   Yes [provider]  Probiotic Product (PROBIOTIC PO) Take 1 tablet by mouth daily.   Yes [provider]  prochlorperazine (COMPAZINE) 10 MG tablet Take 1 tablet (10 mg total) by mouth every 6 (six) hours as needed for nausea or vomiting. 11/04/17  Yes Curt Bears, MD  sulfamethoxazole-trimethoprim (BACTRIM DS,SEPTRA DS) 800-160 MG tablet Take 1 tablet by mouth 2 (two) times daily. 11/18/17  Yes Alla Feeling, NP  furosemide (LASIX) 20 MG tablet Take 1 tablet (20 mg total) by mouth 2 (two) times daily. Patient not taking: Reported on 11/20/2017 09/08/17   Dessa Phi, DO  ipratropium (ATROVENT) 0.02 % nebulizer solution Take 2.5 mLs (0.5 mg total) by nebulization 4 (four) times daily. Patient not taking: Reported on 11/20/2017 07/15/17   Parrett, Fonnie Mu, NP  levalbuterol (XOPENEX) 1.25 MG/0.5ML nebulizer solution Take 1.25 mg by nebulization every 8 (eight) hours as needed for wheezing or shortness of breath. Patient not taking: Reported on 11/20/2017 10/13/17   Florencia Reasons, MD  metoprolol tartrate (LOPRESSOR) 25 MG tablet TAKE 1 TABLET (25 MG TOTAL) BY MOUTH 2 (TWO) TIMES DAILY. Patient not taking: Reported on 11/20/2017 09/11/15   Lorretta Harp, MD     Vital Signs: BP 127/87 (BP Location: Right Arm)   Pulse 97   Temp (!) 97.3 F (36.3 C) (Oral)   Resp 18   Ht 5\' 11"  (1.803 m)   Wt 246 lb 0.5 oz (111.6 kg)   SpO2 98%   BMI 34.31 kg/m   Physical Exam awake/alert; left pleurx cath intact, dressing dry,  site mildly tender  Imaging: [place] Chest (pleural Effusion)  Result Date: 11/24/2017 CLINICAL DATA:  Pleural effusion on CT and chest radiograph EXAM: CHEST ULTRASOUND COMPARISON:  CT 11/20/2017 chest radiograph 12/21/2017 FINDINGS: Large LEFT pleural effusion IMPRESSION: Large LEFT pleural effusion. Electronically Signed   By: Suzy Bouchard M.D.   On: 11/24/2017 17:18   Ir Guided Niel Hummer W Catheter Placement  Result Date: 11/25/2017 INDICATION: Left lung malignancy.  Left pleural effusion. EXAM: LEFT PLEURX DRAIN PLACEMENT MEDICATIONS: The patient is currently admitted to the hospital and receiving intravenous antibiotics. The antibiotics were administered within an appropriate time frame prior to the initiation of the procedure. ANESTHESIA/SEDATION: Fentanyl 50 mcg IV; Versed 1 mg IV Moderate Sedation Time:  15 minutes The patient was continuously monitored during the procedure by the interventional radiology nurse under my direct supervision. COMPLICATIONS: None immediate. PROCEDURE: Informed written consent was obtained from the patient after a thorough discussion of the procedural risks, benefits and alternatives. All questions were addressed. Maximal Sterile Barrier Technique was utilized including caps, mask, sterile gowns, sterile gloves, sterile drape, hand hygiene and skin antiseptic. A timeout was performed prior to the initiation of the procedure. The left flank was prepped and draped in a sterile fashion. 1% lidocaine was utilized for local anesthesia. Under sonographic guidance, an 18 gauge needle was advanced into the left pleural space and removed over an Amplatz wire. The 16 French peel-away sheath was inserted. A second incision was made 10 cm anterior to the initial puncture site. The PleurX drain was then advanced from the second incision out the puncture site incision. It was then fed through the peel-away sheath. The peel-away sheath was removed. The puncture site incision was closed with  a 3 0 Vicryl subcutaneous stitch and 4 0 Vicryl subcuticular stitch. The second incision was closed with an 0 Prolene pursestring knot. Dermabond was applied. 1200 cc clear yellow fluid was aspirated. FINDINGS: Imaging documents placement of a left PleurX drain. IMPRESSION: Successful left PleurX drain placement. 1200 cc fluid was obtained from the left PleurX drain. Electronically Signed   By: Marybelle Killings M.D.   On: 11/25/2017 16:19   Ir US Guide Bx Asp/drain  Result Date: 11/25/2017 INDICATION: Left lung malignancy.  Left pleural effusion. EXAM: LEFT PLEURX DRAIN PLACEMENT MEDICATIONS: The patient is currently admitted to the hospital and receiving intravenous antibiotics. The antibiotics were administered within an appropriate time frame prior to the initiation of the procedure. ANESTHESIA/SEDATION: Fentanyl 50 mcg IV; Versed 1 mg IV Moderate Sedation Time:  15 minutes The patient was continuously monitored during the procedure by the interventional radiology nurse under my direct supervision. COMPLICATIONS: None immediate. PROCEDURE: Informed written consent was obtained from the patient after a thorough discussion of the procedural risks, benefits and alternatives. All questions were addressed. Maximal Sterile Barrier Technique was utilized including caps, mask, sterile gowns, sterile gloves, sterile drape, hand hygiene and skin antiseptic. A timeout was performed  prior to the initiation of the procedure. The left flank was prepped and draped in a sterile fashion. 1% lidocaine was utilized for local anesthesia. Under sonographic guidance, an 18 gauge needle was advanced into the left pleural space and removed over an Amplatz wire. The 16 French peel-away sheath was inserted. A second incision was made 10 cm anterior to the initial puncture site. The PleurX drain was then advanced from the second incision out the puncture site incision. It was then fed through the peel-away sheath. The peel-away sheath was  removed. The puncture site incision was closed with a 3 0 Vicryl subcutaneous stitch and 4 0 Vicryl subcuticular stitch. The second incision was closed with an 0 Prolene pursestring knot. Dermabond was applied. 1200 cc clear yellow fluid was aspirated. FINDINGS: Imaging documents placement of a left PleurX drain. IMPRESSION: Successful left PleurX drain placement. 1200 cc fluid was obtained from the left PleurX drain. Electronically Signed   By: Marybelle Killings M.D.   On: 11/25/2017 16:19   Dg Chest Port 1 View  Result Date: 11/24/2017 CLINICAL DATA:  Follow-up left pleural effusion. EXAM: PORTABLE CHEST 1 VIEW COMPARISON:  Portable chest x-ray of November 22, 2017 FINDINGS: There remains near total opacification of the left hemithorax. Marked mediastinal shift is not observed though mild shift may be present. A tiny amount of aerated lung remains in the apex. The right lung is hypoinflated but grossly clear. 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 IMPRESSION: Persistent near-total opacification of the left hemithorax due to postobstructive atelectasis or effusion. Electronically Signed   By: David  Martinique M.D.   On: 11/24/2017 10:03    Labs:  CBC: Recent Labs    11/23/17 0316 11/23/17 1523 11/24/17 0338 11/25/17 0231 11/26/17 0419  WBC 23.0*  --  21.8* 26.0* 20.9*  HGB 9.5* 9.7* 9.6* 9.6* 9.4*  HCT 29.4* 30.1* 29.7* 30.4* 30.0*  PLT 232  --  224 190 170    COAGS: Recent Labs    10/23/17 1513  11/22/17 1340 11/23/17 0316 11/25/17 0231 11/26/17 0419  INR 1.00  --   --   --  1.02  --   APTT  --    < > 66* 74* 58* 63*   < > = values in this interval not displayed.    BMP: Recent Labs    11/23/17 0316 11/24/17 0338 11/25/17 0231 11/26/17 0419  NA 134* 136 135 138  K 3.9 4.1 4.4 4.4  CL 100* 100* 100* 100*  CO2 23 26 26 30   GLUCOSE 177* 185* 168* 181*  BUN 29* 31* 31* 34*  CALCIUM 8.5* 8.7* 8.7* 8.7*  CREATININE 1.42* 1.11 1.00 1.02  GFRNONAA 46* >60 >60 >60  GFRAA  53* >60 >60 >60    LIVER FUNCTION TESTS: Recent Labs    11/10/17 0812 11/18/17 0934 11/20/17 1612 11/21/17 0727  BILITOT 0.7 0.5 0.4 0.6  AST 12 18 20 17   ALT 11 15 15* 14*  ALKPHOS 81 86 100 86  PROT 6.4 6.0* 6.3* 5.5*  ALBUMIN 2.5* 2.6* 2.9* 2.6*    Assessment and Plan: Pt with hx stage IV lung cancer, recurrent left pleural effusion, s/p left pleurx 6/4; drain pleurx prn based on pt's symptoms; other plans per primary team/oncology   Electronically Signed: D. Rowe Robert, PA-C 11/26/2017, 9:47 AM   I spent a total of 15 minutes at the the patient's bedside AND on the patient's hospital floor or unit, greater than 50% of which was counseling/coordinating  care for left pleurx    Patient ID: Travis Ferguson., male   DOB: 12/30/1940, 77 y.o.   MRN: [id number]

## 2017-11-26 NOTE — Progress Notes (Signed)
Pt. ready to be placed on CPAP for h/s, humidifier refilled with s/w, oxygen placed on/inline at 4 lpm, tolerating well.

## 2017-11-26 NOTE — Progress Notes (Signed)
PROGRESS NOTE [facility]   [name].   XBD:[id number] DOB: 02-Oct-1940  DOA: 11/20/2017 PCP: Crist Infante, MD   Brief Narrative:  Travis Ferguson. is a 77 year old male with medical history significant for NSCLC recently started on chemo and radiation, COPD, OSA, sarcoidosis and chronic pain syndrome presented to the emergency department complaining of worsening of shortness of breath.  On ED evaluation patient was found to have a large pleural effusion, EDP performed thoracentesis at bedside.  Patient was also found to be in A. fib with RVR along with hypotension.  CTA chest was done which shows small pulmonary embolism on the right, with left pleural effusion and near total obstruction of the left main bronchus.  Patient was admitted with working diagnosis of acute on chronic respiratory failure due to malignant pleural effusion complicated with A. fib with RVR. Hospital course complicated by AKI.   Subjective: No nausea no vomiting no fever no chills.  Has a significant amount of pain in his back as well as where the pleural catheter is inserted.  Breathing is also very heavy.  Overnight heart rate remains 140s.  Assessment & Plan: Acute on chronic respiratory failure with hypoxia Multifactorial from pleural effusion, Afib with RVR and Post obstructive PNA with severe leukocytosis pulmonary embolism and COPD. Treat underlying causes.   Recurrent left sided malignant pleural effusion  Recently diagnosed with NSCLC S/p Thoracentesis in the ED yield 500 cc  Started radio and chemo this month, follow by Dr Earlie Server - case discussed with Dr. Earlie Server he plan to continue active treatment. Resume radiotherapy while in the hospital. Hold chemotherapy until patient is stronger.  Given pleural catheter placement.  Oncology was consulted, recommend outpatient follow-up.  Pulmonary embolism Continue heparin drip, off BiPAP, currently doing well on [place]. Did well with CPAP. Hopefully  can transition to oral A/C in the next 1-2 days. PCCM not recommending lysis at this time given small anatomic clot burden plus poor prognosis. Continue to monitor closely.  A fib with RVR - much improved  In setting of compressive mass Converted to NSR, patient on Amio and Cardizem, rate better controlled now. Expect rate to have bouts  up to 130's given increase in pressure from mass. Continue cardizem and Amio. Cards have signed off.  Increasing Cardizem dose from 60 mg to 90 mg.  Post obstructive PNA  WBC mild increase from yesterday, however patient on steroid, will continue to taper Solu-Medrol as able, continue Zosyn, and de-escalate in AM if remains stable. 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  Continue to monitor closely   COPD/OSA Continue CPAP See above   NSCLC  Started radio and chemo this month, follow by Dr Earlie Server, poor prognosis   Can resume radiation, continue to hold chemo   DVT prophylaxis: Heparin Sq Code Status: DNR Family Communication: Wife at bedside  Disposition Plan: Transfer to telemetry   Consultants:   Cardiology  PCCM  Palliative care   Interventional radiology  Procedures:   Pleurx catheter placement  Antimicrobials: Anti-infectives (From admission, onward)   Start     Dose/Rate Route Frequency Ordered Stop   11/22/17 0500  vancomycin (VANCOCIN) 1,250 mg in sodium chloride 0.9 % 250 mL IVPB  Status:  Discontinued     1,250 mg 166.7 mL/hr over 90 Minutes Intravenous Every 24 hours 11/21/17 0911 11/22/17 0919   11/21/17 0600  vancomycin (VANCOCIN) 1,500 mg in sodium chloride 0.9 % 500 mL IVPB  Status:  Discontinued     1,500 mg 250 mL/hr over 120 Minutes  Intravenous Every 48 hours 11/20/17 2314 11/21/17 0911   11/21/17 0400  piperacillin-tazobactam (ZOSYN) IVPB 3.375 g     3.375 g 12.5 mL/hr over 240 Minutes Intravenous Every 8 hours 11/20/17 2314     11/20/17 1800  vancomycin (VANCOCIN) IVPB 1000 mg/200 mL premix     1,000 mg 200 mL/hr over 60 Minutes Intravenous  Once 11/20/17 1750 11/21/17 0036   11/20/17 1800  ceFEPIme (MAXIPIME) 2 g in sodium chloride 0.9 % 100 mL IVPB     2 g 200 mL/hr over 30 Minutes Intravenous  Once 11/20/17 1750 11/20/17 2048      Objective: Vitals:   11/26/17 1112 11/26/17 1155 11/26/17 1158 11/26/17 1438  BP:  120/84    Pulse: (!) 117 (!) 134    Resp: (!) 24 (!) 22    Temp:   97.7 F (36.5 C)   TempSrc:   Oral   SpO2: 95% 90%  96%  Weight:      Height:        Intake/Output Summary (Last 24 hours) at 11/26/2017 1757 Last data filed at 11/26/2017 1320 Gross per 24 hour  Intake 293.9 ml  Output 2100 ml  Net -1806.1 ml   Filed Weights   11/24/17 1735 11/25/17 0553 11/26/17 0456  Weight: 112.4 kg (247 lb 12.8 oz) 111.8 kg (246 lb 8 oz) 111.6 kg (246 lb 0.5 oz)    Examination:  General: Ill looking  Cardiovascular: S1S2 Irr Irr @ 100, no murmurs  Respiratory: Diffuse wheezing, decrease BS in the Left, Abdominal: Soft, NT, ND, bowel sounds + Extremities: Trace LE edema, no cyanosis  Data Reviewed: I have personally reviewed following labs and imaging studies  CBC: Recent Labs  Lab 11/20/17 1612  11/22/17 0316 11/23/17 0316 11/23/17 1523 11/24/17 0338 11/25/17 0231 11/26/17 0419  WBC 34.0*   < > 27.1* 23.0*  --  21.8* 26.0* 20.9*  NEUTROABS 30.3*  --   --   --   --   --   --   --   HGB 11.9*   < > 9.9* 9.5* 9.7* 9.6* 9.6* 9.4*  HCT 35.6*   < > 30.4* 29.4* 30.1* 29.7* 30.4* 30.0*  MCV 81.3   < > 81.3 82.4  --  83.2 84.2 85.0  PLT 371   < > 295 232  --  224 190 170   < > = values in this interval not displayed.   Basic Metabolic Panel: Recent Labs  Lab 11/22/17 0316 11/23/17 0316  11/24/17 0338 11/25/17 0231 11/26/17 0419  NA 134* 134* 136 135 138  K 4.0 3.9 4.1 4.4 4.4  CL 100* 100* 100* 100* 100*  CO2 22 23 26 26 30   GLUCOSE 190* 177* 185* 168* 181*  BUN 25* 29* 31* 31* 34*  CREATININE 1.66* 1.42* 1.11 1.00 1.02  CALCIUM 8.4* 8.5* 8.7* 8.7* 8.7*  MG 1.7 2.0 1.9 1.9 2.1   GFR: Estimated Creatinine Clearance: 77 mL/min (by C-G formula based on SCr of 1.02 mg/dL). Liver Function Tests: Recent Labs  Lab 11/20/17 1612 11/21/17 0727  AST 20 17  ALT 15* 14*  ALKPHOS 100 86  BILITOT 0.4 0.6  PROT 6.3* 5.5*  ALBUMIN 2.9* 2.6*   No results for input(s): LIPASE, AMYLASE in the last 168 hours. No results for input(s): AMMONIA in the last 168 hours. Coagulation Profile: Recent Labs  Lab 11/25/17 0231  INR 1.02   Cardiac Enzymes: Recent Labs  Lab 11/20/17 2317 11/21/17 0727 11/21/17 1043  TROPONINI 0.03* <0.03 0.03*   BNP (last 3 results) Recent Labs    08/08/17 0958  PROBNP 92.0   HbA1C: No results for input(s): HGBA1C in the last 72 hours. CBG: Recent Labs  Lab 11/25/17 0746 11/25/17 1645 11/26/17 0023 11/26/17 0745 11/26/17 1631  GLUCAP 159* 159* 172* 191* 217*   Lipid Profile: No results for input(s): CHOL, HDL, LDLCALC, TRIG, CHOLHDL, LDLDIRECT in the last 72 hours. Thyroid Function Tests: No results for input(s): TSH, T4TOTAL, FREET4, T3FREE, THYROIDAB in the last 72 hours. Anemia Panel: No results for input(s): VITAMINB12, FOLATE, FERRITIN, TIBC, IRON, RETICCTPCT in the last 72 hours. Sepsis Labs: Recent Labs  Lab 11/20/17 1656 11/20/17 1934 11/21/17 1043 11/22/17 0316  PROCALCITON  --   --  0.46 0.40  LATICACIDVEN 2.61* 1.14  --   --     Recent Results (from the past 240 hour(s))  Urine Culture     Status: None   Collection Time: 11/18/17 11:59 AM  Result Value Ref Range Status   Specimen Description   Final    URINE, CLEAN CATCH Performed at [facility], [place] [address],  [place], [place] [address]    Special Requests   Final    NONE Performed at [facility], [place] [address], [place], [place] [address]    Culture   Final    NO GROWTH Performed at [facility], [place] [address] [place], [place] [address]    Report Status 11/19/2017 FINAL  Final  Blood culture (routine x 2)     Status: None   Collection Time: 11/20/17  4:49 PM  Result Value Ref Range Status   Specimen Description   Final    BLOOD RIGHT ANTECUBITAL Performed at [place] [address] [place], [place] [address]    Special Requests   Final    BOTTLES DRAWN AEROBIC AND ANAEROBIC Blood Culture adequate volume Performed at [place] [address], [place], [place] [address]    Culture   Final    NO GROWTH 5 DAYS Performed at [facility], [place] [address] [place], [place] [address]    Report Status 11/25/2017 FINAL  Final  MRSA PCR Screening     Status: None   Collection Time: 11/20/17 11:02 PM  Result Value Ref Range Status   MRSA by PCR NEGATIVE NEGATIVE Final    Comment:        The GeneXpert MRSA Assay (FDA approved for NASAL specimens only), is one component of a comprehensive MRSA colonization surveillance program. It is not intended to diagnose MRSA infection nor to guide or monitor treatment for MRSA infections. Performed at [facility], [place] [address] [place], [place] [address]   Blood culture (routine x 2)     Status: None   Collection Time: 11/20/17 11:17 PM  Result Value Ref Range Status   Specimen Description   Final    BLOOD RIGHT ANTECUBITAL Performed at [place] [address] [place], [place] [address]    Special Requests   Final    BOTTLES DRAWN AEROBIC AND ANAEROBIC Blood  Culture adequate volume Performed at [facility] [address] [place], [place] [address]    Culture   Final    NO GROWTH 5  DAYS Performed at [facility], [place] [address], [place], [place] [address]    Report Status 11/26/2017 FINAL  Final      Radiology Studies: Ir Guided Niel Hummer W Catheter Placement  Result Date: 11/25/2017 INDICATION: Left lung malignancy.  Left pleural effusion. EXAM: LEFT PLEURX DRAIN PLACEMENT MEDICATIONS: The patient is currently admitted to the hospital and receiving intravenous antibiotics. The antibiotics were administered within an appropriate time frame prior to the initiation of the procedure. ANESTHESIA/SEDATION: Fentanyl 50 mcg IV; Versed 1 mg IV Moderate Sedation Time:  15 minutes The patient was continuously monitored during the procedure by the interventional radiology nurse under my direct supervision. COMPLICATIONS: None immediate. PROCEDURE: Informed written consent was obtained from the patient after a thorough discussion of the procedural risks, benefits and alternatives. All questions were addressed. Maximal Sterile Barrier Technique was utilized including caps, mask, sterile gowns, sterile gloves, sterile drape, hand hygiene and skin antiseptic. A timeout was performed prior to the initiation of the procedure. The left flank was prepped and draped in a sterile fashion. 1% lidocaine was utilized for local anesthesia. Under sonographic guidance, an 18 gauge needle was advanced into the left pleural space and removed over an Amplatz wire. The 16 French peel-away sheath was inserted. A second incision was made 10 cm anterior to the initial puncture site. The PleurX drain was then advanced from the second incision out the puncture site incision. It was then fed through the peel-away sheath. The peel-away sheath was removed. The puncture site incision was closed with a 3 0 Vicryl subcutaneous stitch and 4 0 Vicryl subcuticular stitch. The second incision was closed with an 0 Prolene pursestring knot. Dermabond was applied. 1200 cc clear yellow fluid was aspirated. FINDINGS: Imaging  documents placement of a left PleurX drain. IMPRESSION: Successful left PleurX drain placement. 1200 cc fluid was obtained from the left PleurX drain. Electronically Signed   By: Marybelle Killings M.D.   On: 11/25/2017 16:19   Ir US Guide Bx Asp/drain  Result Date: 11/25/2017 INDICATION: Left lung malignancy.  Left pleural effusion. EXAM: LEFT PLEURX DRAIN PLACEMENT MEDICATIONS: The patient is currently admitted to the hospital and receiving intravenous antibiotics. The antibiotics were administered within an appropriate time frame prior to the initiation of the procedure. ANESTHESIA/SEDATION: Fentanyl 50 mcg IV; Versed 1 mg IV Moderate Sedation Time:  15 minutes The patient was continuously monitored during the procedure by the interventional radiology nurse under my direct supervision. COMPLICATIONS: None immediate. PROCEDURE: Informed written consent was obtained from the patient after a thorough discussion of the procedural risks, benefits and alternatives. All questions were addressed. Maximal Sterile Barrier Technique was utilized including caps, mask, sterile gowns, sterile gloves, sterile drape, hand hygiene and skin antiseptic. A timeout was performed prior to the initiation of the procedure. The left flank was prepped and draped in a sterile fashion. 1% lidocaine was utilized for local anesthesia. Under sonographic guidance, an 18 gauge needle was advanced into the left pleural space and removed over an Amplatz wire. The 16 French peel-away sheath was inserted. A second incision was made 10 cm anterior to the initial puncture site. The PleurX drain was then advanced from the second incision out the puncture site incision. It was then fed through the peel-away sheath. The peel-away sheath was removed. The puncture site  incision was closed with a 3 0 Vicryl subcutaneous stitch and 4 0 Vicryl subcuticular stitch. The second incision was closed with an 0 Prolene pursestring knot. Dermabond was applied. 1200 cc  clear yellow fluid was aspirated. FINDINGS: Imaging documents placement of a left PleurX drain. IMPRESSION: Successful left PleurX drain placement. 1200 cc fluid was obtained from the left PleurX drain. Electronically Signed   By: Marybelle Killings M.D.   On: 11/25/2017 16:19    Scheduled Meds: . amiodarone  400 mg Oral BID  . arformoterol  15 mcg Nebulization BID  . atorvastatin  20 mg Oral q1800  . budesonide (PULMICORT) nebulizer solution  0.25 mg Nebulization BID  . diltiazem  90 mg Oral Q6H  . DULoxetine  30 mg Oral Daily  . FLUoxetine  40 mg Oral Q breakfast  . guaiFENesin  600 mg Oral BID  . ipratropium  0.5 mg Nebulization Q6H  . levalbuterol  0.63 mg Nebulization Q6H  . methylPREDNISolone (SOLU-MEDROL) injection  60 mg Intravenous Q12H   Continuous Infusions: . heparin 1,200 Units/hr (11/26/17 1421)  . piperacillin-tazobactam (ZOSYN)  IV Stopped (11/26/17 1634)     LOS: 6 days    Time spent: Total of 25 minutes spent with pt, greater than 50% of which was spent in discussion of  treatment, counseling and coordination of care   Berle Mull, MD Pager: Text Page via [email or web address]   If 7PM-7AM, please contact night-coverage [email or web address] 11/26/2017, 5:57 PM

## 2017-11-26 NOTE — Progress Notes (Signed)
   11/26/17 1435  Clinical Encounter Type  Visited With Patient and family together  Visit Type Follow-up  Spiritual Encounters  Spiritual Needs Emotional;Prayer  Stress Factors  Family Stress Factors  (dealing with the diagnosis and the time remaining)   Rounding on Palliative Patients.  Patient welcomed me to come in and extended his hand.  He then began by saying we have made all the plans and talked to the preacher.  He felt comfort in knowing he had made his wishes known,but is in that place where you know your time is limited and that transition is very difficult for he and his wife.  He shared about how he was with the [employer] for 37 years and his wife added all the people he had saved and the terrible things he saw.  Wife seemed like she was trying to hold it together for him, but acknowledges this to be very difficult.  Their immediate family does live in [place] and seems to be supportive. Wife stated they lost an infant near birth and the wife's sister had died and so this life stage is bringing up lots of triggers. Wife seems to indicate that Hospice will get involved and they hope to be residential Hospice.  Will follow and support as needed. Will pass on for Chaplain to visit tomorrow.  Chaplain Katherene Ponto

## 2017-11-26 NOTE — Progress Notes (Signed)
PT Cancellation Note  Patient Details Name: Travis Ferguson. MRN: [id number] DOB: 1941/01/02   Cancelled Treatment:    Reason Eval/Treat Not Completed: PT screened, no needs identified, will sign off;Pain limiting ability to participate(per RN pt is having SOB and severe pain in his hip with minimal movement in bed and cannot tolerate activity at present. Noted pt is likely to [address] )    Philomena Doheny 11/26/2017, 2:29 PM [phone]

## 2017-11-26 NOTE — Progress Notes (Signed)
Patient with complaints of pain overnight. NP on call was notified to get something else for pain. Order for one times dose of IV Morphine received and given. Will continue to monitor patient.

## 2017-11-26 NOTE — Care Management Note (Signed)
Case Management Note  Patient Details  Name: Travis Ferguson. MRN: [id number] Date of Birth: November 28, 1940  Subjective/Objective: Spoke to patient/spouse in room about d/c plans-patient states he makes his decisions-he doesn't want to suffer,doesn't want life support. Per spouse-she can't take care of him @ home,they both state they want hospice-spouse wants [facility]. MD notified.Also notified CSW. Palliative care team already following.Await further recc.                  Action/Plan:d/c plan Residential hospice   Expected Discharge Date:  (unknown)               Expected Discharge Plan:  [place]  In-House Referral:  NA, Clinical Social Work  Discharge planning Services  CM Consult  Post Acute Care Choice:    Choice offered to:     DME Arranged:    DME Agency:     HH Arranged:    [place] Agency:     Status of Service:  In process, will continue to follow  If discussed at Long Length of Stay Meetings, dates discussed:    Additional Comments:  Dessa Phi, RN 11/26/2017, 2:00 PM

## 2017-11-26 NOTE — Progress Notes (Signed)
ANTICOAGULATION CONSULT NOTE - Follow Up Consult  Pharmacy Consult for Heparin Indication: hxatrial fibrillation; suspects new acute PE (home Eliquis on hold)   Allergies  Allergen Reactions  . Statins Other (See Comments)    Muscle aches - tolerating Vytorin    Patient Measurements: Height: 5\' 11"  (180.3 cm) Weight: 246 lb 8 oz (111.8 kg) IBW/kg (Calculated) : 75.3 Heparin Dosing Weight:   Vital Signs: Temp: 97.3 F (36.3 C) (06/05 0456) Temp Source: Oral (06/05 0456) BP: 127/87 (06/05 0456) Pulse Rate: 101 (06/05 0456)  Labs: Recent Labs    11/24/17 0338 11/25/17 0231 11/26/17 0419  HGB 9.6* 9.6* 9.4*  HCT 29.7* 30.4* 30.0*  PLT 224 190 170  APTT  --  58* 63*  LABPROT  --  13.3  --   INR  --  1.02  --   HEPARINUNFRC  --  0.77* 0.69  CREATININE 1.11 1.00 1.02    Estimated Creatinine Clearance: 77.1 mL/min (by C-G formula based on SCr of 1.02 mg/dL).   Medications:  Infusions:  . heparin 1,400 Units/hr (11/25/17 2221)  . piperacillin-tazobactam (ZOSYN)  IV 3.375 g (11/26/17 0455)    Assessment: Patient with PTT below goal but Heparin level at goal.  Per RN, hematuria better but still pinkish.  Will continue at current rate.  Goal of Therapy:  Heparin level 0.3-0.7 units/ml  PTT 66-102 sec Monitor platelets by anticoagulation protocol: Yes   Plan:  Continue heparin drip at current rate Recheck level PTT and heparin level at 1200  [address], [place] 11/26/2017,5:24 AM

## 2017-11-27 ENCOUNTER — Ambulatory Visit: Payer: Medicare Other

## 2017-11-27 LAB — BASIC METABOLIC PANEL
ANION GAP: 9 (ref 5–15)
BUN: 31 mg/dL — ABNORMAL HIGH (ref 6–20)
CALCIUM: 8.4 mg/dL — AB (ref 8.9–10.3)
CO2: 28 mmol/L (ref 22–32)
Chloride: 100 mmol/L — ABNORMAL LOW (ref 101–111)
Creatinine, Ser: 0.88 mg/dL (ref 0.61–1.24)
GLUCOSE: 182 mg/dL — AB (ref 65–99)
POTASSIUM: 4.3 mmol/L (ref 3.5–5.1)
Sodium: 137 mmol/L (ref 135–145)

## 2017-11-27 LAB — CBC
HCT: 31.9 % — ABNORMAL LOW (ref 39.0–52.0)
Hemoglobin: 10 g/dL — ABNORMAL LOW (ref 13.0–17.0)
MCH: 26.8 pg (ref 26.0–34.0)
MCHC: 31.3 g/dL (ref 30.0–36.0)
MCV: 85.5 fL (ref 78.0–100.0)
PLATELETS: 181 10*3/uL (ref 150–400)
RBC: 3.73 MIL/uL — AB (ref 4.22–5.81)
RDW: 15.8 % — ABNORMAL HIGH (ref 11.5–15.5)
WBC: 25.3 10*3/uL — AB (ref 4.0–10.5)

## 2017-11-27 LAB — GLUCOSE, CAPILLARY
GLUCOSE-CAPILLARY: 163 mg/dL — AB (ref 65–99)
GLUCOSE-CAPILLARY: 230 mg/dL — AB (ref 65–99)

## 2017-11-27 LAB — MAGNESIUM: Magnesium: 1.9 mg/dL (ref 1.7–2.4)

## 2017-11-27 LAB — HEPARIN LEVEL (UNFRACTIONATED): HEPARIN UNFRACTIONATED: 0.55 [IU]/mL (ref 0.30–0.70)

## 2017-11-27 MED ORDER — PREDNISONE 20 MG PO TABS
20.0000 mg | ORAL_TABLET | Freq: Every day | ORAL | Status: DC
  Administered 2017-11-28 – 2017-12-01 (×4): 20 mg via ORAL
  Filled 2017-11-27 (×4): qty 1

## 2017-11-27 MED ORDER — BENZONATATE 100 MG PO CAPS
100.0000 mg | ORAL_CAPSULE | Freq: Three times a day (TID) | ORAL | Status: DC
  Administered 2017-11-27 – 2017-12-01 (×10): 100 mg via ORAL
  Filled 2017-11-27 (×9): qty 1

## 2017-11-27 MED ORDER — DILTIAZEM HCL ER COATED BEADS 180 MG PO CP24
360.0000 mg | ORAL_CAPSULE | Freq: Every day | ORAL | Status: DC
  Administered 2017-11-27 – 2017-12-01 (×5): 360 mg via ORAL
  Filled 2017-11-27 (×5): qty 2

## 2017-11-27 MED ORDER — PREDNISOLONE 5 MG PO TABS: 30.0000 mg | ORAL_TABLET | Freq: Every day | ORAL | Status: DC

## 2017-11-27 NOTE — Progress Notes (Signed)
Palliative care progress note  Reason for consult: Goals of care in light of metastatic disease and continued functional decline  I met today with Mr. Gehrig, his wife, his son, and a niece.   Mr. Milbourne and his wife have reportedly decided that he would best be served by working to transition to a focus on comfort rather than pursuing further aggressive interventions.  They have been telling staff that he is interested in pursuing placement at [facility].  We discussed clinical course as well as wishes moving forward in regard to advanced directives.  Concepts specific to code status, future disease modifying therapy for his cancer, and continued care this hospitalization discussed.  We discussed difference between a aggressive medical intervention path and a palliative, comfort focused care path.  Mr. Buch is very open with the fact that he feels that he is approaching end-of-life and with continued need for drainage of Pleurx catheter, he feels that he has reached a point where now he is beginning to suffer without seeing any real benefit through further pursuit of aggressive medical interventions.  He tells me that his overall desire is to focus on comfort and transition to someplace where he can die with dignity.  He does not want to return to his own home as he thinks this will be too traumatic due to prior experience he has had with having people die at home in his family when he was younger.  Concept of Hospice and Palliative Care were discussed.  We discussed options for hospice care including home with hospice (he is not interested in this option), long-term placement with hospice support, and residential hospice placement.  Both he and his wife expressed interest in residential hospice and we discussed the goal of this being for comfort for people in the last couple weeks of their life.  We discussed a little regarding his prognosis and the fact that while I feel he is currently  eligible for hospice benefits with an expected prognosis of less than 6 months, I am not sure exactly where we are in regards to his prognosis of the next couple of weeks.  We discussed that with a transition to full comfort, this would include discontinuation of antibiotics, anticoagulation for known PE, and likely transitioning off of medications for rate control for his A. fib.  With these changes, his prognosis may very well be less than 2 weeks and he may be a good candidate for residential hospice placement.  We did also discuss, however, that there is a chance that he could stabilize and even if he is accepted to residential hospice (I explained that this is not my decision and the individual hospice would need to make final determination if he is a good fit) and if it becomes apparent that his prognosis is likely greater than a couple of weeks he would be transition to another care venue.  I then saw his wife and son in the hall later in the afternoon and we discussed again regarding options for hospice.  They understand concerned that his prognosis is difficult to determine at this point in time and he may not be accepted to residential hospice.  Family does not feel they can care for him at home and we will therefore likely be looking at placement at a long-term care facility with hospice support.  Questions and concerns addressed.   PMT will continue to support holistically.  - Family is interested in placement residential hospice for end-of-life care.  I  will plan to follow-up again tomorrow morning to see if I can get a better feeling on his prognosis and if this appears to be the most appropriate care plan moving forward.  We will also plan to discuss with primary attending and I will likely ReachOut to [facility] in order to review case with them as well.  Time: 1500- 1545, 1720-1745 Total time: 70 minutes Greater than 50%  of this time was spent counseling and coordinating care related  to the above assessment and plan.  Micheline Rough, MD [facility] Team [phone]

## 2017-11-27 NOTE — Progress Notes (Signed)
Palliative care progress note  Reason for consult: Goals of care in light of metastatic disease and continued functional decline  Discussed case again with Dr. Posey Pronto including anticipated prognosis moving forward.  He is in agreement that with transition off of heparin, his clot will likely progress quickly and anticipated prognosis is weeks at best.  I met again today with Mr. Rote and his wife.   We again discussed difference between a aggressive medical intervention path and a palliative, comfort focused care path.  Mr. Polhamus remains clear in desire to focus on comfort as he has reached a point where now he is beginning to suffer without seeing any real benefit through further pursuit of aggressive medical interventions.  - Family is interested in placement at residential hospice for end-of-life care.  Discussed again with patient, wife, and Dr. Posey Pronto.  He is interested in pursuing full comfort care, including discontinuation of abx and anticoagulation.  With known PE, postobstructive PNA, and recurrent effusion and a plan for full comfort, I anticipate his prognosis will be likely < 2 weeks.  He has continued symptom burden that will increase moving forward, and I believe that he will be best served by residential hospice if it can be arranged.      Total time: 25 minutes Greater than 50%  of this time was spent counseling and coordinating care related to the above assessment and plan.  Micheline Rough, MD [place] [facility] [phone]

## 2017-11-27 NOTE — Progress Notes (Signed)
PROGRESS NOTE [facility]   [name].   PIR:[id number] DOB: [birth year]  DOA: 11/20/2017 PCP: Crist Infante, MD   Brief Narrative:  Travis Ferguson. is a 77 year old male with medical history significant for NSCLC recently started on chemo and radiation, COPD, OSA, sarcoidosis and chronic pain syndrome presented to the emergency department complaining of worsening of shortness of breath.  On ED evaluation patient was found to have a large pleural effusion, EDP performed thoracentesis at bedside.  Patient was also found to be in A. fib with RVR along with hypotension.  CTA chest was done which shows small pulmonary embolism on the right, with left pleural effusion and near total obstruction of the left main bronchus.  Patient was admitted with working diagnosis of acute on chronic respiratory failure due to malignant pleural effusion complicated with A. fib with RVR. Hospital course complicated by AKI.   Subjective: Pain still remains although patient has not used that much of pain medication.  Complains about nausea no vomiting.  No abdominal pain.  Passing gas.  Oral intake is adequate.  Feels short of breath.  Assessment & Plan: Acute on chronic respiratory failure with hypoxia Multifactorial from pleural effusion, Afib with RVR and Post obstructive PNA with severe leukocytosis pulmonary embolism and COPD. Treat underlying causes.   Recurrent left sided malignant pleural effusion  Recently diagnosed with NSCLC S/p Thoracentesis in the ED yield 500 cc  Started radio and chemo this month, follow by Dr Earlie Server - case discussed with Dr. Earlie Server he plan to continue active treatment.  Given pleural catheter placement.  Oncology was consulted, recommend outpatient follow-up.  Pulmonary embolism Started on heparin drip.  Patient was initially on BiPAP. Now on CPAP at night.  PCCM not recommending lysis at this time given small anatomic clot burden plus poor prognosis. Continue  to monitor closely. Now patient wants to remain comfortable and therefore anti-Coblation will be discontinued. Discussed with radiology, repeat CAT scan performed this admission which diagnosed the patient with acute PE appears to have severe motion degraded exam and therefore diagnosis can be equally vocal.  Regardless patient does not want any anticoagulation for now.  A fib with RVR - much improved  In setting of compressive mass Converted to NSR, patient on Amio and Cardizem, rate better controlled now. Expect rate to have bouts  up to 130's given increase in pressure from mass. Continue cardizem and Amio. Cards have signed off.  Exam increase to 90 mg every 6 hours which she is tolerating well. Transition to Cardizem 360 mg.  Post obstructive PNA  Continue nebulizer treatment continue O2 supplementation to keep O2 sat > 91% Continue to follow clinically, Procalcitonin not reliable in the setting of malignancy  Completed 7 days of antibiotic.  Will stop. Vision on IV Solu-Medrol, will stop.  Transition to oral prednisone.  AKI - Cr resolved  Likely prerenal, from hypotension Avoid nephrotoxic agents and hypotension Monitor renal function   Combine CHF  Seems to be euvolemic  Continue to monitor closely   COPD/OSA Continue CPAP See above   NSCLC  Started radio and chemo this month, follow by Dr Earlie Server, poor prognosis   Can resume radiation, continue to hold chemo   DVT prophylaxis: Heparin Sq Code Status: DNR Patient wanted to discuss option for hospice.  Wife at bedside. Discussion regarding patient's goals of care.  Patient mentions that he wants to remain comfortable and does not want to suffer anymore and would like to consider  inpatient hospice. At present with patient's tumor burden anticipate patient would be a candidate for intubation hospice and therefore palliative care made referral to inpatient hospice and will follow up on the plan. Family Communication: Wife at  bedside  Disposition Plan: Transfer to telemetry   Consultants:   Cardiology  PCCM  Palliative care   Interventional radiology  Procedures:   Pleurx catheter placement  Antimicrobials: Anti-infectives (From admission, onward)   Start     Dose/Rate Route Frequency Ordered Stop   11/22/17 0500  vancomycin (VANCOCIN) 1,250 mg in sodium chloride 0.9 % 250 mL IVPB  Status:  Discontinued     1,250 mg 166.7 mL/hr over 90 Minutes Intravenous Every 24 hours 11/21/17 0911 11/22/17 0919   11/21/17 0600  vancomycin (VANCOCIN) 1,500 mg in sodium chloride 0.9 % 500 mL IVPB  Status:  Discontinued     1,500 mg 250 mL/hr over 120 Minutes Intravenous Every 48 hours 11/20/17 2314 11/21/17 0911   11/21/17 0400  piperacillin-tazobactam (ZOSYN) IVPB 3.375 g  Status:  Discontinued     3.375 g 12.5 mL/hr over 240 Minutes Intravenous Every 8 hours 11/20/17 2314 11/27/17 1001   11/20/17 1800  vancomycin (VANCOCIN) IVPB 1000 mg/200 mL premix     1,000 mg 200 mL/hr over 60 Minutes Intravenous  Once 11/20/17 1750 11/21/17 0036   11/20/17 1800  ceFEPIme (MAXIPIME) 2 g in sodium chloride 0.9 % 100 mL IVPB     2 g 200 mL/hr over 30 Minutes Intravenous  Once 11/20/17 1750 11/20/17 2048      Objective: Vitals:   11/27/17 0413 11/27/17 0443 11/27/17 0812 11/27/17 1255  BP: 97/73   124/73  Pulse: (!) 113   92  Resp: 18   20  Temp: 98.1 F (36.7 C)   97.6 F (36.4 C)  TempSrc: Oral   Oral  SpO2: 93%  96% 96%  Weight:  112.2 kg (247 lb 5.7 oz)    Height:        Intake/Output Summary (Last 24 hours) at 11/27/2017 1852 Last data filed at 11/27/2017 1814 Gross per 24 hour  Intake 1274 ml  Output 825 ml  Net 449 ml   Filed Weights   11/25/17 0553 11/26/17 0456 11/27/17 0443  Weight: 111.8 kg (246 lb 8 oz) 111.6 kg (246 lb 0.5 oz) 112.2 kg (247 lb 5.7 oz)    Examination:  General: Ill looking  Cardiovascular: S1S2 Irr Irr @ 100, no murmurs  Respiratory: Diffuse wheezing, decrease BS in the  Left, Abdominal: Soft, NT, ND, bowel sounds + Extremities: Trace LE edema, no cyanosis  Data Reviewed: I have personally reviewed following labs and imaging studies  CBC: Recent Labs  Lab 11/23/17 0316 11/23/17 1523 11/24/17 0338 11/25/17 0231 11/26/17 0419 11/27/17 0502  WBC 23.0*  --  21.8* 26.0* 20.9* 25.3*  HGB 9.5* 9.7* 9.6* 9.6* 9.4* 10.0*  HCT 29.4* 30.1* 29.7* 30.4* 30.0* 31.9*  MCV 82.4  --  83.2 84.2 85.0 85.5  PLT 232  --  224 190 170 675   Basic Metabolic Panel: Recent Labs  Lab 11/23/17 0316 11/24/17 0338 11/25/17 0231 11/26/17 0419 11/27/17 0502  NA 134* 136 135 138 137  K 3.9 4.1 4.4 4.4 4.3  CL 100* 100* 100* 100* 100*  CO2 23 26 26 30 28   GLUCOSE 177* 185* 168* 181* 182*  BUN 29* 31* 31* 34* 31*  CREATININE 1.42* 1.11 1.00 1.02 0.88  CALCIUM 8.5* 8.7* 8.7* 8.7* 8.4*  MG 2.0 1.9  1.9 2.1 1.9   GFR: Estimated Creatinine Clearance: 89.6 mL/min (by C-G formula based on SCr of 0.88 mg/dL). Liver Function Tests: Recent Labs  Lab 11/21/17 0727  AST 17  ALT 14*  ALKPHOS 86  BILITOT 0.6  PROT 5.5*  ALBUMIN 2.6*   No results for input(s): LIPASE, AMYLASE in the last 168 hours. No results for input(s): AMMONIA in the last 168 hours. Coagulation Profile: Recent Labs  Lab 11/25/17 0231  INR 1.02   Cardiac Enzymes: Recent Labs  Lab 11/20/17 2317 11/21/17 0727 11/21/17 1043  TROPONINI 0.03* <0.03 0.03*   BNP (last 3 results) Recent Labs    08/08/17 0958  PROBNP 92.0   HbA1C: No results for input(s): HGBA1C in the last 72 hours. CBG: Recent Labs  Lab 11/26/17 0745 11/26/17 1631 11/26/17 2340 11/27/17 0742 11/27/17 1545  GLUCAP 191* 217* 215* 163* 230*   Lipid Profile: No results for input(s): CHOL, HDL, LDLCALC, TRIG, CHOLHDL, LDLDIRECT in the last 72 hours. Thyroid Function Tests: No results for input(s): TSH, T4TOTAL, FREET4, T3FREE, THYROIDAB in the last 72 hours. Anemia Panel: No results for input(s): VITAMINB12, FOLATE,  FERRITIN, TIBC, IRON, RETICCTPCT in the last 72 hours. Sepsis Labs: Recent Labs  Lab 11/20/17 1934 11/21/17 1043 11/22/17 0316  PROCALCITON  --  0.46 0.40  LATICACIDVEN 1.14  --   --     Recent Results (from the past 240 hour(s))  Urine Culture     Status: None   Collection Time: 11/18/17 11:59 AM  Result Value Ref Range Status   Specimen Description   Final    URINE, CLEAN CATCH Performed at [facility], [place] [address] [place], [place] [address]    Special Requests   Final    NONE Performed at [facility], [place] [address], [place], [place] [address]    Culture   Final    NO GROWTH Performed at [facility], [place] [address] [place], [place] [address]    Report Status 11/19/2017 FINAL  Final  Blood culture (routine x 2)     Status: None   Collection Time: 11/20/17  4:49 PM  Result Value Ref Range Status   Specimen Description   Final    BLOOD RIGHT ANTECUBITAL Performed at [facility] [address] [place], [place] [address]    Special Requests   Final    BOTTLES DRAWN AEROBIC AND ANAEROBIC Blood Culture adequate volume Performed at [facility] [address] [place], [place] [address]    Culture   Final    NO GROWTH 5 DAYS Performed at [facility], [place] [address] [place], [place] [address]    Report Status 11/25/2017 FINAL  Final  MRSA PCR Screening     Status: None   Collection Time: 11/20/17 11:02 PM  Result Value Ref Range Status   MRSA by PCR NEGATIVE NEGATIVE Final    Comment:        The GeneXpert MRSA Assay (FDA approved for NASAL specimens only), is one component of a comprehensive MRSA colonization surveillance program. It is not intended to diagnose MRSA infection nor to guide or monitor treatment for MRSA infections. Performed at [facility], [place] [address], [place], [place] [address]   Blood culture (routine x 2)      Status: None   Collection Time: 11/20/17 11:17 PM  Result Value Ref Range Status   Specimen Description   Final  BLOOD RIGHT ANTECUBITAL Performed at [place] [address] [place], [place] [address]    Special Requests   Final    BOTTLES DRAWN AEROBIC AND ANAEROBIC Blood Culture adequate volume Performed at [facility] [address], [place], [place] [address]    Culture   Final    NO GROWTH 5 DAYS Performed at [facility], [place] [address], [place], [place] [address]    Report Status 11/26/2017 FINAL  Final      Radiology Studies: No results found.  Scheduled Meds: . amiodarone  400 mg Oral BID  . arformoterol  15 mcg Nebulization BID  . benzonatate  100 mg Oral TID  . budesonide (PULMICORT) nebulizer solution  0.25 mg Nebulization BID  . diltiazem  360 mg Oral Daily  . DULoxetine  30 mg Oral Daily  . FLUoxetine  40 mg Oral Q breakfast  . guaiFENesin  600 mg Oral BID  . ipratropium  0.5 mg Nebulization Q6H  . levalbuterol  0.63 mg Nebulization Q6H  . [START ON 11/28/2017] predniSONE  20 mg Oral Q breakfast   Continuous Infusions:    LOS: 7 days    Time spent: 35 minutes    Berle Mull, MD Pager: Text Page via [email or web address]   If 7PM-7AM, please contact night-coverage [email or web address] 11/27/2017, 6:52 PM

## 2017-11-27 NOTE — Clinical Social Work Note (Signed)
Clinical Social Work Assessment  Patient Details  Name: Travis Ferguson. MRN: [id number] Date of Birth: [birth year]  Date of referral:  11/27/17               Reason for consult:  End of Life/Hospice                Permission sought to share information with:  Chartered certified accountant granted to share information::  Yes, Verbal Permission Granted  Name::     Designer, fashion/clothing::     Relationship::     Contact Information:     Housing/Transportation Living arrangements for the past 2 months:  [place] of Information:  Patient Patient Interpreter Needed:  None Criminal Activity/Legal Involvement Pertinent to Current Situation/Hospitalization:  No - Comment as needed Significant Relationships:  Spouse, Adult Children Lives with:  Spouse Do you feel safe going back to the place where you live?    Need for family participation in patient care:  Yes (Comment)  Care giving concerns:  Patient from home with wife. Patient is now full comfort care. CSW consulted for residential hospice.    Social Worker assessment / plan:  CSW spoke with patient at bedside regarding consult for residential hospice, patient accompanied by wife. Patient reported that he is interested in residential hospice preferably [facility]. CSW explained residential hospice referral process, patient and patient's wife verbalized understanding. CSW agreed to make referral to [facility].   CSW contacted [place] and made referral to [facility]. Hospital Liaison reported no beds currently available and agreed to follow up with referral.  CSW will continue to follow and assist with discharge planning.  Employment status:  Retired Nurse, adult PT Recommendations:  Not assessed at this time Information / Referral to community resources:  Other (Comment Required)(Residential hospice [facility]))  Patient/Family's Response to  care:  Patient/patient's wife appreciative of CSW assistance with [facility] referral.   Patient/Family's Understanding of and Emotional Response to Diagnosis, Current Treatment, and Prognosis:  Patient presented calm and verbalized understanding of current treatment plan. Patient accepting of prognosis and verbalized plan to dc to [facility]. CSW provided active listening.  Emotional Assessment Appearance:  Appears stated age Attitude/Demeanor/Rapport:  Engaged Affect (typically observed):  Accepting, Appropriate, Calm Orientation:  Oriented to Self, Oriented to Place, Oriented to  Time, Oriented to Situation Alcohol / Substance use:  Not Applicable Psych involvement (Current and /or in the community):  No (Comment)  Discharge Needs  Concerns to be addressed:  Care Coordination Readmission within the last 30 days:  Yes Current discharge risk:  Terminally ill Barriers to Discharge:  Continued Medical Work up, Hospice Bed not available   [employer], LCSW 11/27/2017, 11:26 AM

## 2017-11-27 NOTE — Progress Notes (Signed)
[place] and [facility] Liaison [facility]) RN note  Received request from [place] for family interest in [place] with request for possible transfer 11/28/17.  Chart reviewed and spoke with family to acknowledge referral.  Unfortunately, [facility] is not able to offer a room today.  Family and CSW are aware. [facility] Liaison will follow up with CSW and family tomorrow or sooner if room becomes available.  Please do not hesitate to call with questions.    Thank you,   Gar Ponto, [facility] Liaison [place] are now on AMION

## 2017-11-27 NOTE — Progress Notes (Signed)
ANTICOAGULATION CONSULT NOTE   Pharmacy Consult for heparin Indication:hx  atrial fibrillation; suspects new acute PE (home Eliquis on hold)  Allergies  Allergen Reactions  . Statins Other (See Comments)    Muscle aches - tolerating Vytorin    Patient Measurements: Height: 5\' 11"  (180.3 cm) Weight: 247 lb 5.7 oz (112.2 kg) IBW/kg (Calculated) : 75.3 Heparin Dosing Weight: 99 kg  Vital Signs: Temp: 98.1 F (36.7 C) (06/06 0413) Temp Source: Oral (06/06 0413) BP: 97/73 (06/06 0413) Pulse Rate: 113 (06/06 0413)  Labs: Recent Labs    11/25/17 0231 11/26/17 0419 11/26/17 1204 11/26/17 1958 11/27/17 0502  HGB 9.6* 9.4*  --   --  10.0*  HCT 30.4* 30.0*  --   --  31.9*  PLT 190 170  --   --  181  APTT 58* 63* 72*  --   --   LABPROT 13.3  --   --   --   --   INR 1.02  --   --   --   --   HEPARINUNFRC 0.77* 0.69 0.77* 0.61 0.55  CREATININE 1.00 1.02  --   --  0.88    Estimated Creatinine Clearance: 89.6 mL/min (by C-G formula based on SCr of 0.88 mg/dL).   Medications:  - Eliquis 5 mg bid PTA (last dose taken on 5/30 at 11a)  Assessment: Patient's a 77 y.o M with hx lung cancer and afib on Eliquis PTA, presented to the ED on 5/30 with c/o SOB and back pain.  Chest CTA on 5/30 showed moderate L pleural effusion (thoracentesis performed in the ED at ~6p) and findings with suspicion for small RLL PE.  Eliquis placed on hold and patient was started on heparin drip for afib and PE.   Significant Events: 6/2 Heparin gtt turned off for hematuria 6/4 Heparin gtt turned off for Pleurx drain placement  Today, 11/27/2017:  Heparin level at goal (0.55) on 1200 units/hr.    CBC: Hgb low but stable, pltc WNL  Patient still with pinkish-red fluid in foley-->unchanged from yesterday   Goal of Therapy:  Heparin level 0.3-0.7 units/ml aPTT 66-102 seconds Monitor platelets by anticoagulation protocol: Yes   Plan:  - Continue heparin drip at 1200 units/hr - Daily heparin level,  CBC - monitor for s/s bleeding - F/U plan of care, long-term anticoagulation plan  Netta Cedars, PharmD, BCPS Pager: [phone] 11/27/2017 8:48 AM

## 2017-11-28 ENCOUNTER — Ambulatory Visit: Payer: Medicare Other

## 2017-11-28 LAB — CBC
HEMATOCRIT: 32.3 % — AB (ref 39.0–52.0)
Hemoglobin: 10.4 g/dL — ABNORMAL LOW (ref 13.0–17.0)
MCH: 27.2 pg (ref 26.0–34.0)
MCHC: 32.2 g/dL (ref 30.0–36.0)
MCV: 84.3 fL (ref 78.0–100.0)
PLATELETS: 191 10*3/uL (ref 150–400)
RBC: 3.83 MIL/uL — AB (ref 4.22–5.81)
RDW: 16.1 % — ABNORMAL HIGH (ref 11.5–15.5)
WBC: 21.9 10*3/uL — AB (ref 4.0–10.5)

## 2017-11-28 LAB — GLUCOSE, CAPILLARY
GLUCOSE-CAPILLARY: 223 mg/dL — AB (ref 65–99)
GLUCOSE-CAPILLARY: 185 mg/dL — AB (ref 65–99)
Glucose-Capillary: 140 mg/dL — ABNORMAL HIGH (ref 65–99)

## 2017-11-28 LAB — BASIC METABOLIC PANEL
Anion gap: 8 (ref 5–15)
BUN: 30 mg/dL — ABNORMAL HIGH (ref 6–20)
CHLORIDE: 99 mmol/L — AB (ref 101–111)
CO2: 29 mmol/L (ref 22–32)
Calcium: 8.4 mg/dL — ABNORMAL LOW (ref 8.9–10.3)
Creatinine, Ser: 0.79 mg/dL (ref 0.61–1.24)
GFR calc non Af Amer: >60 mL/min (ref >60)
Glucose, Bld: 131 mg/dL — ABNORMAL HIGH (ref 65–99)
POTASSIUM: 4.6 mmol/L (ref 3.5–5.1)
SODIUM: 136 mmol/L (ref 135–145)

## 2017-11-28 MED ORDER — MORPHINE SULFATE ER 30 MG PO TBCR
30.0000 mg | EXTENDED_RELEASE_TABLET | Freq: Two times a day (BID) | ORAL | Status: DC
  Administered 2017-11-28 – 2017-12-01 (×7): 30 mg via ORAL
  Filled 2017-11-28 (×7): qty 1

## 2017-11-28 MED ORDER — SALINE SPRAY 0.65 % NA SOLN
1.0000 | NASAL | Status: DC | PRN
  Administered 2017-11-28: 1 via NASAL
  Filled 2017-11-28: qty 44

## 2017-11-28 NOTE — Progress Notes (Signed)
   11/28/17 1500  Clinical Encounter Type  Visited With Patient and family together  Visit Type Follow-up  Spiritual Encounters  Spiritual Needs Prayer;Emotional   Followed up on a visit from Wednesday.  Patient was sleeping, but appeared so much more relaxed than a previous visit.  Sat with the patient's wife Letta Median) and she shared about the past couple of days and the expected move to Hospice.  She shared about how this time has allowed them all to process and accept Jim's wishes.  Family seems to be supporting one another.  Wife some anxiousness about what is next,but is staying strong for patient and her family.  We prayed together.  Let the wife know that a Chaplain is available anytime for them.  Will follow and support as needed. Chaplain Katherene Ponto

## 2017-11-28 NOTE — Progress Notes (Signed)
Patient c/o right nare being" blocked up"' the PCP was notified.

## 2017-11-28 NOTE — Progress Notes (Signed)
PROGRESS NOTE [facility]   [name].   PXT:[id number] DOB: 1940/09/13  DOA: 11/20/2017 PCP: Crist Infante, MD   Brief Narrative:  Travis Ferguson. is a 77 year old male with medical history significant for NSCLC recently started on chemo and radiation, COPD, OSA, sarcoidosis and chronic pain syndrome presented to the emergency department complaining of worsening of shortness of breath.  On ED evaluation patient was found to have a large pleural effusion, EDP performed thoracentesis at bedside.  Patient was also found to be in A. fib with RVR along with hypotension.  CTA chest was done which shows small pulmonary embolism on the right, with left pleural effusion and near total obstruction of the left main bronchus.  Patient was admitted with working diagnosis of acute on chronic respiratory failure due to malignant pleural effusion complicated with A. fib with RVR. Hospital course complicated by AKI.   Subjective: Continues to have shortness of breath, complains about pain is still there.  Urinary catheter was cloudy today.  Assessment & Plan: Acute on chronic respiratory failure with hypoxia Multifactorial from pleural effusion, Afib with RVR and Post obstructive PNA with severe leukocytosis pulmonary embolism and COPD.   Recurrent left sided malignant pleural effusion  Recently diagnosed with NSCLC S/p Thoracentesis in the ED yield 500 cc  Started radio and chemo this month, follow by Dr Earlie Server - case discussed with Dr. Earlie Server he plan to continue active treatment.  Given pleural catheter placement.  Oncology was consulted, recommend outpatient follow-up.  Pulmonary embolism Started on heparin drip.  Patient was initially on BiPAP. Now on CPAP at night.  PCCM not recommending lysis at this time given small anatomic clot burden plus poor prognosis. Continue to monitor closely. Now patient wants to remain comfortable and therefore anti-Coblation will be  discontinued. Discussed with radiology, repeat CAT scan performed this admission which diagnosed the patient with acute PE appears to have severe motion degraded exam and therefore diagnosis can be equally vocal.  Regardless patient does not want any anticoagulation for now.  A fib with RVR -RVR resolved In setting of compressive mass Converted to NSR, patient on Amio and Cardizem, rate better controlled now. Expect rate to have bouts  up to 130's given increase in pressure from mass. Continue cardizem and Amio. Cards have signed off.  Exam increase to 90 mg every 6 hours which he is tolerating well. Transition to Cardizem 360 mg.\ Discontinue telemetry  Post obstructive PNA  Continue nebulizer treatment continue O2 supplementation to keep O2 sat > 91% Continue to follow clinically, Procalcitonin not reliable in the setting of malignancy  Completed 7 days of antibiotic.  Will stop. on IV Solu-Medrol, will stop.  Transition to oral prednisone.  AKI - Cr resolved  Likely prerenal, from hypotension Avoid nephrotoxic agents and hypotension Monitor renal function   Combine CHF  Seems to be euvolemic  Continue to monitor closely   COPD/OSA Continue CPAP See above   NSCLC  Started radio and chemo this month, follow by Dr Earlie Server, poor prognosis   Patient wants.  To transition to complete comfort  Acute urinary retention. Foley catheter was placed in the ICU while the patient was critically ill. Now patient is end of life and his catheter has been stopped up causing retention.  Will replace the catheter.  Pain control. Patient has been on PRN pain medication although has not been using it frequently.  Regardless patient continues to complain of severe pain and remains tachycardic and tachypneic.  With pulmonary embolism, Pleurx catheter placement as well as active cancer suspect the patient's pain is probably driving his symptoms as mentioned above. We will place the patient on  scheduled MS Contin low-dose.  Monitor.  DVT prophylaxis: Heparin Sq Code Status: DNR Patient wanted to discuss option for hospice.  Wife at bedside. Discussion regarding patient's goals of care.  Patient mentions that he wants to remain comfortable and does not want to suffer anymore and would like to consider inpatient hospice. Palliative care consulted, hospice consulted as well.  Currently awaiting bed transfer.  Family Communication: Wife at bedside  Disposition Plan: Transfer to telemetry   Consultants:   Cardiology  PCCM  Palliative care   Interventional radiology  Procedures:   Pleurx catheter placement  Antimicrobials: Anti-infectives (From admission, onward)   Start     Dose/Rate Route Frequency Ordered Stop   11/22/17 0500  vancomycin (VANCOCIN) 1,250 mg in sodium chloride 0.9 % 250 mL IVPB  Status:  Discontinued     1,250 mg 166.7 mL/hr over 90 Minutes Intravenous Every 24 hours 11/21/17 0911 11/22/17 0919   11/21/17 0600  vancomycin (VANCOCIN) 1,500 mg in sodium chloride 0.9 % 500 mL IVPB  Status:  Discontinued     1,500 mg 250 mL/hr over 120 Minutes Intravenous Every 48 hours 11/20/17 2314 11/21/17 0911   11/21/17 0400  piperacillin-tazobactam (ZOSYN) IVPB 3.375 g  Status:  Discontinued     3.375 g 12.5 mL/hr over 240 Minutes Intravenous Every 8 hours 11/20/17 2314 11/27/17 1001   11/20/17 1800  vancomycin (VANCOCIN) IVPB 1000 mg/200 mL premix     1,000 mg 200 mL/hr over 60 Minutes Intravenous  Once 11/20/17 1750 11/21/17 0036   11/20/17 1800  ceFEPIme (MAXIPIME) 2 g in sodium chloride 0.9 % 100 mL IVPB     2 g 200 mL/hr over 30 Minutes Intravenous  Once 11/20/17 1750 11/20/17 2048      Objective: Vitals:   11/28/17 0445 11/28/17 0500 11/28/17 0904 11/28/17 1301  BP: 127/85   111/73  Pulse: 98   (!) 102  Resp: (!) 22   18  Temp: 97.9 F (36.6 C)   98 F (36.7 C)  TempSrc:    Oral  SpO2: 98%  95% 97%  Weight:  112.8 kg (248 lb 9.6 oz)    Height:         Intake/Output Summary (Last 24 hours) at 11/28/2017 1915 Last data filed at 11/28/2017 0537 Gross per 24 hour  Intake 360 ml  Output -  Net 360 ml   Filed Weights   11/26/17 0456 11/27/17 0443 11/28/17 0500  Weight: 111.6 kg (246 lb 0.5 oz) 112.2 kg (247 lb 5.7 oz) 112.8 kg (248 lb 9.6 oz)    Examination:  General: Alert, Awake and Oriented to Time, Place and Person. Appear in marked distress, affect appropriate Eyes: PERRL, Conjunctiva normal ENT: Oral Mucosa clear moist. Neck: difficult to assess JVD, no Abnormal Mass Or lumps Cardiovascular: S1 and S2 Present, no Murmur, Peripheral Pulses Present Respiratory: increased respiratory effort, Bilateral Air entry equal and Decreased, no use of accessory muscle, bilateral  Crackles, no wheezes Abdomen: Bowel Sound present, Soft and no tenderness, no hernia Skin: no redness, no Rash, no induration Extremities: trace Pedal edema, no calf tenderness Neurologic: Grossly no focal neuro deficit. Bilaterally Equal motor strength  Data Reviewed: I have personally reviewed following labs and imaging studies  CBC: Recent Labs  Lab 11/24/17 0338 11/25/17 0231 11/26/17 0419 11/27/17 0502 11/28/17  0515  WBC 21.8* 26.0* 20.9* 25.3* 21.9*  HGB 9.6* 9.6* 9.4* 10.0* 10.4*  HCT 29.7* 30.4* 30.0* 31.9* 32.3*  MCV 83.2 84.2 85.0 85.5 84.3  PLT 224 190 170 181 169   Basic Metabolic Panel: Recent Labs  Lab 11/23/17 0316 11/24/17 0338 11/25/17 0231 11/26/17 0419 11/27/17 0502 11/28/17 0515  NA 134* 136 135 138 137 136  K 3.9 4.1 4.4 4.4 4.3 4.6  CL 100* 100* 100* 100* 100* 99*  CO2 23 26 26 30 28 29   GLUCOSE 177* 185* 168* 181* 182* 131*  BUN 29* 31* 31* 34* 31* 30*  CREATININE 1.42* 1.11 1.00 1.02 0.88 0.79  CALCIUM 8.5* 8.7* 8.7* 8.7* 8.4* 8.4*  MG 2.0 1.9 1.9 2.1 1.9  --    GFR: Estimated Creatinine Clearance: 98.8 mL/min (by C-G formula based on SCr of 0.79 mg/dL). Liver Function Tests: No results for input(s): AST, ALT,  ALKPHOS, BILITOT, PROT, ALBUMIN in the last 168 hours. No results for input(s): LIPASE, AMYLASE in the last 168 hours. No results for input(s): AMMONIA in the last 168 hours. Coagulation Profile: Recent Labs  Lab 11/25/17 0231  INR 1.02   Cardiac Enzymes: No results for input(s): CKTOTAL, CKMB, CKMBINDEX, TROPONINI in the last 168 hours. BNP (last 3 results) Recent Labs    08/08/17 0958  PROBNP 92.0   HbA1C: No results for input(s): HGBA1C in the last 72 hours. CBG: Recent Labs  Lab 11/27/17 0742 11/27/17 1545 11/28/17 0014 11/28/17 0736 11/28/17 1616  GLUCAP 163* 230* 140* 223* 185*   Lipid Profile: No results for input(s): CHOL, HDL, LDLCALC, TRIG, CHOLHDL, LDLDIRECT in the last 72 hours. Thyroid Function Tests: No results for input(s): TSH, T4TOTAL, FREET4, T3FREE, THYROIDAB in the last 72 hours. Anemia Panel: No results for input(s): VITAMINB12, FOLATE, FERRITIN, TIBC, IRON, RETICCTPCT in the last 72 hours. Sepsis Labs: Recent Labs  Lab 11/22/17 0316  PROCALCITON 0.40    Recent Results (from the past 240 hour(s))  Blood culture (routine x 2)     Status: None   Collection Time: 11/20/17  4:49 PM  Result Value Ref Range Status   Specimen Description   Final    BLOOD RIGHT ANTECUBITAL Performed at [facility] [address], [place], [place] [address]    Special Requests   Final    BOTTLES DRAWN AEROBIC AND ANAEROBIC Blood Culture adequate volume Performed at [facility] [address], [place], [place] [address]    Culture   Final    NO GROWTH 5 DAYS Performed at [facility], [place] [address], [place], [place] [address]    Report Status 11/25/2017 FINAL  Final  MRSA PCR Screening     Status: None   Collection Time: 11/20/17 11:02 PM  Result Value Ref Range Status   MRSA by PCR NEGATIVE NEGATIVE Final    Comment:        The GeneXpert MRSA Assay (FDA approved for NASAL specimens only), is one component of  a comprehensive MRSA colonization surveillance program. It is not intended to diagnose MRSA infection nor to guide or monitor treatment for MRSA infections. Performed at [facility], [place] [address] [place], [place] [address]   Blood culture (routine x 2)     Status: None   Collection Time: 11/20/17 11:17 PM  Result Value Ref Range Status   Specimen Description   Final    BLOOD RIGHT ANTECUBITAL Performed at [facility]  [address] [place], [place] [address]    Special Requests   Final    BOTTLES DRAWN AEROBIC AND ANAEROBIC Blood Culture adequate volume Performed at [facility] [address], [place], [place] [address]    Culture   Final    NO GROWTH 5 DAYS Performed at [facility], [place] [address] [place],  [address]    Report Status 11/26/2017 FINAL  Final      Radiology Studies: No results found.  Scheduled Meds: . amiodarone  400 mg Oral BID  . arformoterol  15 mcg Nebulization BID  . benzonatate  100 mg Oral TID  . budesonide (PULMICORT) nebulizer solution  0.25 mg Nebulization BID  . diltiazem  360 mg Oral Daily  . DULoxetine  30 mg Oral Daily  . FLUoxetine  40 mg Oral Q breakfast  . guaiFENesin  600 mg Oral BID  . ipratropium  0.5 mg Nebulization Q6H  . levalbuterol  0.63 mg Nebulization Q6H  . morphine  30 mg Oral Q12H  . predniSONE  20 mg Oral Q breakfast   Continuous Infusions:    LOS: 8 days    Time spent: 35 minutes    Berle Mull, MD Pager: Text Page via [email or web address]   If 7PM-7AM, please contact night-coverage [email or web address] 11/28/2017, 7:15 PM

## 2017-11-28 NOTE — Progress Notes (Signed)
[facility] Liaison [facility]) RN note  Update for referral to [facility]. Unfortunately, [employer] [facility] is not able to offer a room today.  Family and Elmyra Ricks, CSW are aware. [facility] Liaison will follow up with CSW and family tomorrow or sooner if room becomes available.   Please do not hesitate to call with questions.    Farrel Gordon, RN, [facility] Liaison [place] are now on AMION

## 2017-11-28 NOTE — Progress Notes (Signed)
Palliative care progress note  Reason for consult: Goals of care in light of metastatic disease and continued functional decline  I met again today with Travis Ferguson and his wife.  He is hopeful to transition to [facility] soon.  He reports that symptomatically he is feeling a little bit better today.  Dr. Posey Pronto has already adjusted pain regimen and I agree with his changes.  - Family is interested in placement at residential hospice for end-of-life care.  With goal for comfort, we have discontinued both abx and anticoagulation.  With known PE, postobstructive PNA, and recurrent effusion and a plan for full comfort, I anticipate his prognosis will be likely < 2 weeks.  He has continued symptom burden that will increase moving forward, and I believe that he will be best served by residential hospice if it can be arranged.      Total time: 20 minutes Greater than 50%  of this time was spent counseling and coordinating care related to the above assessment and plan.  Micheline Rough, MD [facility] [phone]

## 2017-11-29 DIAGNOSIS — C3492 Malignant neoplasm of unspecified part of left bronchus or lung: Principal | ICD-10-CM

## 2017-11-29 DIAGNOSIS — Z515 Encounter for palliative care: Secondary | ICD-10-CM

## 2017-11-29 DIAGNOSIS — Z7189 Other specified counseling: Secondary | ICD-10-CM

## 2017-11-29 LAB — BASIC METABOLIC PANEL
ANION GAP: 9 (ref 5–15)
BUN: 27 mg/dL — ABNORMAL HIGH (ref 6–20)
CO2: 27 mmol/L (ref 22–32)
Calcium: 8.3 mg/dL — ABNORMAL LOW (ref 8.9–10.3)
Chloride: 100 mmol/L — ABNORMAL LOW (ref 101–111)
Creatinine, Ser: 0.83 mg/dL (ref 0.61–1.24)
GFR calc non Af Amer: >60 mL/min (ref >60)
Glucose, Bld: 131 mg/dL — ABNORMAL HIGH (ref 65–99)
Potassium: 4.6 mmol/L (ref 3.5–5.1)
Sodium: 136 mmol/L (ref 135–145)

## 2017-11-29 LAB — GLUCOSE, CAPILLARY
Glucose-Capillary: 128 mg/dL — ABNORMAL HIGH (ref 65–99)
Glucose-Capillary: 146 mg/dL — ABNORMAL HIGH (ref 65–99)

## 2017-11-29 MED ORDER — DOCUSATE SODIUM 100 MG PO CAPS
100.0000 mg | ORAL_CAPSULE | Freq: Two times a day (BID) | ORAL | Status: DC
  Administered 2017-11-30 – 2017-12-01 (×2): 100 mg via ORAL
  Filled 2017-11-29 (×4): qty 1

## 2017-11-29 MED ORDER — MORPHINE SULFATE (PF) 4 MG/ML IV SOLN: 4.0000 mg | Freq: Every day | INTRAVENOUS | Status: DC | PRN

## 2017-11-29 MED ORDER — BIOTENE DRY MOUTH MT LIQD
15.0000 mL | OROMUCOSAL | Status: DC | PRN
Start: 2017-11-29 — End: 2017-12-01

## 2017-11-29 MED ORDER — POLYETHYLENE GLYCOL 3350 17 G PO PACK
17.0000 g | PACK | Freq: Every day | ORAL | Status: DC
  Administered 2017-11-30 – 2017-12-01 (×2): 17 g via ORAL
  Filled 2017-11-29 (×2): qty 1

## 2017-11-29 NOTE — Progress Notes (Signed)
CSW spoke with [facility] liaison Harmon Pier- no bed availability today. [facility] will follow up with CSW in am to determine bed availbility.

## 2017-11-29 NOTE — Progress Notes (Signed)
[facility] brought a Pleur-X catheter drain kit to the floor for RN to drain the Pleur-X Catheter. The RN was assisted by another RN in placing the drain bottle in place to drain the fluid from the Left Pleur-X catheter. The drainage amount was 875 ml of pale yellow fluid.

## 2017-11-29 NOTE — Progress Notes (Signed)
PROGRESS NOTE [facility]   [name].   NLZ:[id number] DOB: Mar 08, 1941  DOA: 11/20/2017 PCP: Crist Infante, MD   Brief Narrative:  Loel Betancur. is a 77 year old male with medical history significant for NSCLC recently started on chemo and radiation, COPD, OSA, sarcoidosis and chronic pain syndrome presented to the emergency department complaining of worsening of shortness of breath.  On ED evaluation patient was found to have a large pleural effusion, EDP performed thoracentesis at bedside.  Patient was also found to be in A. fib with RVR along with hypotension.  CTA chest was done which shows small pulmonary embolism on the right, with left pleural effusion and near total obstruction of the left main bronchus.  Patient was admitted with working diagnosis of acute on chronic respiratory failure due to malignant pleural effusion complicated with A. fib with RVR. Hospital course complicated by AKI.   Subjective: Complain about severe pain at the time of Pleurx catheter drainage.  No nausea no vomiting.  More fatigue today.  Still short of breath.  Assessment & Plan: Acute on chronic respiratory failure with hypoxia Multifactorial from pleural effusion, Afib with RVR and Post obstructive PNA with severe leukocytosis pulmonary embolism and COPD.   Recurrent left sided malignant pleural effusion  Recently diagnosed with NSCLC S/p Thoracentesis in the ED yield 500 cc  Started radio and chemo this month, follow by Dr Earlie Server - case discussed with Dr. Earlie Server he plan to continue active treatment.  Given pleural catheter placement.  Oncology was consulted, recommend outpatient follow-up. Premedicate with pain medication before Pleurx catheter drainage.  Pulmonary embolism Started on heparin drip.  Patient was initially on BiPAP. Now on CPAP at night.  PCCM not recommending lysis at this time given small anatomic clot burden plus poor prognosis. Continue to monitor  closely. Now patient wants to remain comfortable and therefore anti-Coblation will be discontinued. Discussed with radiology, repeat CAT scan performed this admission which diagnosed the patient with acute PE appears to have severe motion degraded exam and therefore diagnosis can be equally vocal.  Regardless patient does not want any anticoagulation for now.  A fib with RVR -RVR resolved In setting of compressive mass Converted to NSR, patient on Amio and Cardizem, rate better controlled now. Expect rate to have bouts  up to 130's given increase in pressure from mass. Continue cardizem and Amio. Cards have signed off.  Exam increase to 90 mg every 6 hours which he is tolerating well. Transition to Cardizem 360 mg.\ Discontinue telemetry  Post obstructive PNA  Continue nebulizer treatment continue O2 supplementation to keep O2 sat > 91% Continue to follow clinically, Procalcitonin not reliable in the setting of malignancy  Completed 7 days of antibiotic.  Will stop. on IV Solu-Medrol, will stop.  Transition to oral prednisone.  AKI - Cr resolved  Likely prerenal, from hypotension Avoid nephrotoxic agents and hypotension Monitor renal function   Combine CHF  Seems to be euvolemic  Continue to monitor closely   COPD/OSA Continue CPAP See above   NSCLC  Started radio and chemo this month, follow by Dr Earlie Server, poor prognosis   Patient wants.  To transition to complete comfort  Acute urinary retention. Foley catheter was placed in the ICU while the patient was critically ill. Now patient is end of life and his catheter has been stopped up causing retention.  Will replace the catheter.  Pain control. Patient has been on PRN pain medication although has not been using it  frequently.  Regardless patient continues to complain of severe pain and remains tachycardic and tachypneic. With pulmonary embolism, Pleurx catheter placement as well as active cancer suspect the patient's pain is  probably driving his symptoms as mentioned above. We will place the patient on scheduled MS Contin low-dose.  Monitor.  DVT prophylaxis: Heparin Sq Code Status: DNR Patient wanted to discuss option for hospice.  Wife at bedside. Discussion regarding patient's goals of care.  Patient mentions that he wants to remain comfortable and does not want to suffer anymore and would like to consider inpatient hospice. Palliative care consulted, hospice consulted as well.  Currently awaiting bed transfer.  Family Communication: Wife at bedside  Disposition Plan: Transfer to telemetry   Consultants:   Cardiology  PCCM  Palliative care   Interventional radiology  Procedures:   Pleurx catheter placement  Antimicrobials: Anti-infectives (From admission, onward)   Start     Dose/Rate Route Frequency Ordered Stop   11/22/17 0500  vancomycin (VANCOCIN) 1,250 mg in sodium chloride 0.9 % 250 mL IVPB  Status:  Discontinued     1,250 mg 166.7 mL/hr over 90 Minutes Intravenous Every 24 hours 11/21/17 0911 11/22/17 0919   11/21/17 0600  vancomycin (VANCOCIN) 1,500 mg in sodium chloride 0.9 % 500 mL IVPB  Status:  Discontinued     1,500 mg 250 mL/hr over 120 Minutes Intravenous Every 48 hours 11/20/17 2314 11/21/17 0911   11/21/17 0400  piperacillin-tazobactam (ZOSYN) IVPB 3.375 g  Status:  Discontinued     3.375 g 12.5 mL/hr over 240 Minutes Intravenous Every 8 hours 11/20/17 2314 11/27/17 1001   11/20/17 1800  vancomycin (VANCOCIN) IVPB 1000 mg/200 mL premix     1,000 mg 200 mL/hr over 60 Minutes Intravenous  Once 11/20/17 1750 11/21/17 0036   11/20/17 1800  ceFEPIme (MAXIPIME) 2 g in sodium chloride 0.9 % 100 mL IVPB     2 g 200 mL/hr over 30 Minutes Intravenous  Once 11/20/17 1750 11/20/17 2048      Objective: Vitals:   11/29/17 0449 11/29/17 0918 11/29/17 1256 11/29/17 1342  BP: 118/77  119/77   Pulse: (!) 102 95 99 88  Resp: 18 18 (!) 22 20  Temp: 97.8 F (36.6 C)  98 F (36.7 C)    TempSrc: Oral  Oral   SpO2: 98% 97% 99% 97%  Weight:      Height:        Intake/Output Summary (Last 24 hours) at 11/29/2017 1444 Last data filed at 11/29/2017 0400 Gross per 24 hour  Intake 240 ml  Output 1525 ml  Net -1285 ml   Filed Weights   11/27/17 0443 11/28/17 0500 11/29/17 0350  Weight: 112.2 kg (247 lb 5.7 oz) 112.8 kg (248 lb 9.6 oz) 113.6 kg (250 lb 6.4 oz)    Examination:  General: Alert, Awake and Oriented to Time, Place and Person. Appear in marked distress, affect appropriate Eyes: PERRL, Conjunctiva normal ENT: Oral Mucosa clear moist. Neck: difficult to assess JVD, no Abnormal Mass Or lumps Cardiovascular: S1 and S2 Present, no Murmur, Peripheral Pulses Present Respiratory: increased respiratory effort, Bilateral Air entry equal and Decreased, no use of accessory muscle, bilateral  Crackles, no wheezes Abdomen: Bowel Sound present, Soft and no tenderness, no hernia Skin: no redness, no Rash, no induration Extremities: trace Pedal edema, no calf tenderness Neurologic: Grossly no focal neuro deficit. Bilaterally Equal motor strength  Data Reviewed: I have personally reviewed following labs and imaging studies  CBC: Recent Labs  Lab 11/24/17 0338 11/25/17 0231 11/26/17 0419 11/27/17 0502 11/28/17 0515  WBC 21.8* 26.0* 20.9* 25.3* 21.9*  HGB 9.6* 9.6* 9.4* 10.0* 10.4*  HCT 29.7* 30.4* 30.0* 31.9* 32.3*  MCV 83.2 84.2 85.0 85.5 84.3  PLT 224 190 170 181 956   Basic Metabolic Panel: Recent Labs  Lab 11/23/17 0316 11/24/17 0338 11/25/17 0231 11/26/17 0419 11/27/17 0502 11/28/17 0515 11/29/17 0513  NA 134* 136 135 138 137 136 136  K 3.9 4.1 4.4 4.4 4.3 4.6 4.6  CL 100* 100* 100* 100* 100* 99* 100*  CO2 23 26 26 30 28 29 27   GLUCOSE 177* 185* 168* 181* 182* 131* 131*  BUN 29* 31* 31* 34* 31* 30* 27*  CREATININE 1.42* 1.11 1.00 1.02 0.88 0.79 0.83  CALCIUM 8.5* 8.7* 8.7* 8.7* 8.4* 8.4* 8.3*  MG 2.0 1.9 1.9 2.1 1.9  --   --    GFR: Estimated  Creatinine Clearance: 95.5 mL/min (by C-G formula based on SCr of 0.83 mg/dL). Liver Function Tests: No results for input(s): AST, ALT, ALKPHOS, BILITOT, PROT, ALBUMIN in the last 168 hours. No results for input(s): LIPASE, AMYLASE in the last 168 hours. No results for input(s): AMMONIA in the last 168 hours. Coagulation Profile: Recent Labs  Lab 11/25/17 0231  INR 1.02   Cardiac Enzymes: No results for input(s): CKTOTAL, CKMB, CKMBINDEX, TROPONINI in the last 168 hours. BNP (last 3 results) Recent Labs    08/08/17 0958  PROBNP 92.0   HbA1C: No results for input(s): HGBA1C in the last 72 hours. CBG: Recent Labs  Lab 11/28/17 0014 11/28/17 0736 11/28/17 1616 11/29/17 0017 11/29/17 0746  GLUCAP 140* 223* 185* 146* 128*   Lipid Profile: No results for input(s): CHOL, HDL, LDLCALC, TRIG, CHOLHDL, LDLDIRECT in the last 72 hours. Thyroid Function Tests: No results for input(s): TSH, T4TOTAL, FREET4, T3FREE, THYROIDAB in the last 72 hours. Anemia Panel: No results for input(s): VITAMINB12, FOLATE, FERRITIN, TIBC, IRON, RETICCTPCT in the last 72 hours. Sepsis Labs: No results for input(s): PROCALCITON, LATICACIDVEN in the last 168 hours.  Recent Results (from the past 240 hour(s))  Blood culture (routine x 2)     Status: None   Collection Time: 11/20/17  4:49 PM  Result Value Ref Range Status   Specimen Description   Final    BLOOD RIGHT ANTECUBITAL Performed at [facility] [address] [place], [place] [address]    Special Requests   Final    BOTTLES DRAWN AEROBIC AND ANAEROBIC Blood Culture adequate volume Performed at [facility] [address], [place], [place] [address]    Culture   Final    NO GROWTH 5 DAYS Performed at [facility], [place] [address] [place], [place] [address]    Report Status 11/25/2017 FINAL  Final  MRSA PCR Screening     Status: None   Collection Time: 11/20/17 11:02 PM  Result Value Ref  Range Status   MRSA by PCR NEGATIVE NEGATIVE Final    Comment:        The GeneXpert MRSA Assay (FDA approved for NASAL specimens only), is one component of a comprehensive MRSA colonization surveillance program. It is not intended to diagnose MRSA infection nor to guide or monitor treatment for MRSA infections. Performed at [facility], [place] [address], [place],  [address]   Blood culture (routine x 2)     Status: None   Collection Time: 11/20/17 11:17 PM  Result Value Ref  Range Status   Specimen Description   Final    BLOOD RIGHT ANTECUBITAL Performed at [facility] [address], [place], [place] [address]    Special Requests   Final    BOTTLES DRAWN AEROBIC AND ANAEROBIC Blood Culture adequate volume Performed at [facility] [address] [place], [place] [address]    Culture   Final    NO GROWTH 5 DAYS Performed at [facility], [place] [address], [place], [place] [address]    Report Status 11/26/2017 FINAL  Final      Radiology Studies: No results found.  Scheduled Meds: . amiodarone  400 mg Oral BID  . arformoterol  15 mcg Nebulization BID  . benzonatate  100 mg Oral TID  . budesonide (PULMICORT) nebulizer solution  0.25 mg Nebulization BID  . diltiazem  360 mg Oral Daily  . docusate sodium  100 mg Oral BID  . DULoxetine  30 mg Oral Daily  . FLUoxetine  40 mg Oral Q breakfast  . guaiFENesin  600 mg Oral BID  . ipratropium  0.5 mg Nebulization Q6H  . levalbuterol  0.63 mg Nebulization Q6H  . morphine  30 mg Oral Q12H  . polyethylene glycol  17 g Oral Daily  . predniSONE  20 mg Oral Q breakfast   Continuous Infusions:    LOS: 9 days    Time spent: 8minutes    Berle Mull, MD Pager: Text Page via [email or web address]   If 7PM-7AM, please contact night-coverage [email or web address] 11/29/2017, 2:44 PM

## 2017-11-30 LAB — GLUCOSE, CAPILLARY: Glucose-Capillary: 183 mg/dL — ABNORMAL HIGH (ref 65–99)

## 2017-11-30 MED ORDER — GUAIFENESIN ER 600 MG PO TB12: 600.0000 mg | ORAL_TABLET | Freq: Two times a day (BID) | ORAL | Status: DC | PRN

## 2017-11-30 NOTE — Progress Notes (Signed)
Palliative care progress note  Reason for consult: Goals of care in light of metastatic disease and continued functional decline  I met again today with Travis Ferguson and his wife.  He is hopeful to transition to [facility] soon.  He reports that symptomatically he is feeling ok today but is tired of taking so many pills.  We reviewed regimen and I changed some scheduled medication to prn to try to reduce pill burden.  - Family is interested in placement at residential hospice for end-of-life care.  With goal for comfort, we have discontinued both abx and anticoagulation.  With PE, postobstructive PNA, and recurrent effusion and a plan for full comfort, I anticipate his prognosis will be likely < 2 weeks.  He has continued symptom burden that will increase moving forward, and I believe that he will be best served by residential hospice if it can be arranged.      Total time: 20 minutes Greater than 50%  of this time was spent counseling and coordinating care related to the above assessment and plan.  Micheline Rough, MD [place] Team [phone]

## 2017-11-30 NOTE — Progress Notes (Signed)
CSW spoke to [facility], [place], on the phone. [facility] will have bed available for patient tomorrow. CSW met with patient and spouse, Letta Median at bedside and updated them on BP bed availability. CSW to follow and support with discharge tomorrow.  Estanislado Emms, [place]

## 2017-11-30 NOTE — Progress Notes (Signed)
[facility] Progress Note  Patient: Travis Ferguson. RDE:[id number]   PCP: Crist Infante, MD DOB: 1941/01/01   DOA: 11/20/2017   DOS: 11/30/2017   Date of Service: the patient was seen and examined on 11/30/2017  Subjective: Comfortable, refused to drain his Pleurx catheter.  No nausea no vomiting no chest pain.  Reports no constipation.  No abdominal pain.  Oral intake is minimal.  Brief hospital course: Pt. with PMH of non-small cell lung cancer with metastasis, COPD, OSA, sarcoidosis, chronic pain syndrome; admitted on 11/20/2017, presented with complaint of shortness of breath, was found to have pneumonia on pleural effusion as well as A. fib with RVR. Pleurx catheter was placed.  RVR resolved.  Due to uncontrolled pain patient decided to transition to comfort care Currently further plan is continue current care..  Assessment and Plan: Acute on chronic respiratory failure with hypoxia History of COPD History of OSA Acute pulmonary embolism Recurrent left-sided malignant pleural effusion Postobstructive pneumonia Recently diagnosed with NSCLC S/p Thoracentesis in the ED yield 500 cc  Started radio and chemo this month, follow by Dr Earlie Server - case discussed with Dr. Earlie Server he plan to continue active treatment. Underwent pleural catheter placement. Premedicate with pain medication before Pleurx catheter drainage. Stable on 2 L of oxygen at present.  No evidence of COPD exacerbation but has severe bronchospasm versus obstruction causing persistent wheezing, continue scheduled nebulizer. Continue CPAP for OSA at night. Drainage of the Pleurx catheter PRN.  Acute Pulmonary embolism Started on heparin drip. PCCM not recommending lysis at this time given small anatomic clot burden plus poor prognosis. Now patient wants to remain comfortable and therefore anti-coagulation will be discontinued per patient wishes. Discussed with radiology, repeat CAT scan performed this admission which  diagnosed the patient with acute PE appears to have severe motion degraded exam and therefore diagnosis can be equivocal. Regardless patient does not want any anticoagulation for now.  A fib with RVR -RVR resolved In setting of compressive mass Converted to NSR, patient on Amio and Cardizem, rate better controlled now.  Continue cardizem and Amio. Cards have signed off.  Cardizem 360 mg Discontinue telemetry  Post obstructive PNA  Continue nebulizer continue O2 supplementation to keep O2 sat > 91% Completed 7 days of antibiotic Complete short course of oral prednisone.  AKI - Cr improved Likely prerenal, from hypotension Avoid nephrotoxic agents and hypotension  Chronic diastolic CHF  Seems to be euvolemic  Continue to monitor closely Echocardiogram shows EF of 50 to 55% on March 2019. Increased pulmonary pressure likely secondary from OSA.  COPD/OSA Continue CPAP See above   NSCLC  Started radio and chemo this month, follow by Dr Earlie Server, poor prognosis   Patient wants To transition to complete comfort  Acute urinary retention. Foley catheter was placed in the ICU while the patient was critically ill. Now patient is end of life and his catheter will be continued.  Pain control. Patient has been on PRN pain medication although has not been using it frequently.  Regardless patient continues to complain of severe pain and remains tachycardic and tachypneic. With pulmonary embolism, Pleurx catheter placement as well as active cancer suspect the patient's pain is probably driving his symptoms as mentioned above. We will place the patient on scheduled MS Contin low-dose.  Monitor.  Diet: Regular diet DVT Prophylaxis: Comfort care  Advance goals of care discussion: DNR/DNI, comfort care  Family Communication: family was present at bedside, at the time of interview. The pt provided  permission to discuss medical plan with the family. Opportunity was given to ask  question and all questions were answered satisfactorily.   Disposition:  Discharge to inpatient hospice versus SNF with hospice.  Consultants: cardiology PCCM  Palliative care IR Procedures:  Pleurx catheter placement   Antibiotics: Anti-infectives (From admission, onward)   Start     Dose/Rate Route Frequency Ordered Stop   11/22/17 0500  vancomycin (VANCOCIN) 1,250 mg in sodium chloride 0.9 % 250 mL IVPB  Status:  Discontinued     1,250 mg 166.7 mL/hr over 90 Minutes Intravenous Every 24 hours 11/21/17 0911 11/22/17 0919   11/21/17 0600  vancomycin (VANCOCIN) 1,500 mg in sodium chloride 0.9 % 500 mL IVPB  Status:  Discontinued     1,500 mg 250 mL/hr over 120 Minutes Intravenous Every 48 hours 11/20/17 2314 11/21/17 0911   11/21/17 0400  piperacillin-tazobactam (ZOSYN) IVPB 3.375 g  Status:  Discontinued     3.375 g 12.5 mL/hr over 240 Minutes Intravenous Every 8 hours 11/20/17 2314 11/27/17 1001   11/20/17 1800  vancomycin (VANCOCIN) IVPB 1000 mg/200 mL premix     1,000 mg 200 mL/hr over 60 Minutes Intravenous  Once 11/20/17 1750 11/21/17 0036   11/20/17 1800  ceFEPIme (MAXIPIME) 2 g in sodium chloride 0.9 % 100 mL IVPB     2 g 200 mL/hr over 30 Minutes Intravenous  Once 11/20/17 1750 11/20/17 2048       Objective: Physical Exam: Vitals:   11/29/17 2333 11/30/17 0123 11/30/17 0549 11/30/17 0853  BP:   112/81   Pulse: 91  (!) 109   Resp: 20  (!) 22   Temp:   98.1 F (36.7 C)   TempSrc:   Oral   SpO2: 95% 96% 96% 96%  Weight:      Height:        Intake/Output Summary (Last 24 hours) at 11/30/2017 1318 Last data filed at 11/30/2017 0600 Gross per 24 hour  Intake 120 ml  Output 850 ml  Net -730 ml   Filed Weights   11/27/17 0443 11/28/17 0500 11/29/17 0350  Weight: 112.2 kg (247 lb 5.7 oz) 112.8 kg (248 lb 9.6 oz) 113.6 kg (250 lb 6.4 oz)   General: Alert, Awake and Oriented to Time, Place and Person. Appear in moderate distress, affect flat Eyes: PERRL,  Conjunctiva normal ENT: Oral Mucosa clear moist. Neck: difficult to assess JVD, no Abnormal Mass Or lumps Cardiovascular: S1 and S2 Present, no Murmur, Peripheral Pulses Present Respiratory: increased respiratory effort, Bilateral Air entry equal and Decreased, no use of accessory muscle, bilateral  Crackles, bilateral  wheezes Abdomen: Bowel Sound present, Soft and no tenderness, no hernia Skin: no redness, no Rash, no induration Extremities: trace Pedal edema, no calf tenderness Neurologic: Grossly no focal neuro deficit. Bilaterally Equal motor strength  Data Reviewed: CBC: Recent Labs  Lab 11/24/17 0338 11/25/17 0231 11/26/17 0419 11/27/17 0502 11/28/17 0515  WBC 21.8* 26.0* 20.9* 25.3* 21.9*  HGB 9.6* 9.6* 9.4* 10.0* 10.4*  HCT 29.7* 30.4* 30.0* 31.9* 32.3*  MCV 83.2 84.2 85.0 85.5 84.3  PLT 224 190 170 181 638   Basic Metabolic Panel: Recent Labs  Lab 11/24/17 0338 11/25/17 0231 11/26/17 0419 11/27/17 0502 11/28/17 0515 11/29/17 0513  NA 136 135 138 137 136 136  K 4.1 4.4 4.4 4.3 4.6 4.6  CL 100* 100* 100* 100* 99* 100*  CO2 26 26 30 28 29 27   GLUCOSE 185* 168* 181* 182* 131* 131*  BUN  31* 31* 34* 31* 30* 27*  CREATININE 1.11 1.00 1.02 0.88 0.79 0.83  CALCIUM 8.7* 8.7* 8.7* 8.4* 8.4* 8.3*  MG 1.9 1.9 2.1 1.9  --   --     Liver Function Tests: No results for input(s): AST, ALT, ALKPHOS, BILITOT, PROT, ALBUMIN in the last 168 hours. No results for input(s): LIPASE, AMYLASE in the last 168 hours. No results for input(s): AMMONIA in the last 168 hours. Coagulation Profile: Recent Labs  Lab 11/25/17 0231  INR 1.02   Cardiac Enzymes: No results for input(s): CKTOTAL, CKMB, CKMBINDEX, TROPONINI in the last 168 hours. BNP (last 3 results) Recent Labs    08/08/17 0958  PROBNP 92.0   CBG: Recent Labs  Lab 11/28/17 0014 11/28/17 0736 11/28/17 1616 11/29/17 0017 11/29/17 0746  GLUCAP 140* 223* 185* 146* 128*   Studies: No results found.  Scheduled  Meds: . amiodarone  400 mg Oral BID  . arformoterol  15 mcg Nebulization BID  . benzonatate  100 mg Oral TID  . budesonide (PULMICORT) nebulizer solution  0.25 mg Nebulization BID  . diltiazem  360 mg Oral Daily  . docusate sodium  100 mg Oral BID  . DULoxetine  30 mg Oral Daily  . FLUoxetine  40 mg Oral Q breakfast  . guaiFENesin  600 mg Oral BID  . ipratropium  0.5 mg Nebulization Q6H  . levalbuterol  0.63 mg Nebulization Q6H  . morphine  30 mg Oral Q12H  . polyethylene glycol  17 g Oral Daily  . predniSONE  20 mg Oral Q breakfast   Continuous Infusions: PRN Meds: acetaminophen **OR** acetaminophen, alum & mag hydroxide-simeth, antiseptic oral rinse, HYDROcodone-acetaminophen, hydrocortisone cream, levalbuterol, LORazepam, morphine injection, morphine injection, ondansetron **OR** ondansetron (ZOFRAN) IV, sodium chloride  Time spent: 25 minutes  Author: Berle Mull, MD [facility] Pager: [phone] 11/30/2017 1:18 PM  If 7PM-7AM, please contact night-coverage at [email or web address], password [facility]

## 2017-11-30 NOTE — Progress Notes (Signed)
Pt refused to have drain emptied at this time. Pt states he is comfortable and not having any trouble breathing. Will continue to monitor closely.

## 2017-11-30 NOTE — Progress Notes (Signed)
Pt. Will call if he wants to go on CPAP tonight, for now will hold off.

## 2017-11-30 NOTE — Progress Notes (Signed)
[facility] room available for Travis Ferguson tomorrow. Spoke with spouse by phone to confirm plan to meet Monday morning at 9:00 to complete paper work. Will update CSW when paper work is complete.   Thank you. Erling Conte, [place]

## 2017-12-01 ENCOUNTER — Inpatient Hospital Stay: Payer: Medicare Other

## 2017-12-01 ENCOUNTER — Ambulatory Visit: Payer: Medicare Other

## 2017-12-01 ENCOUNTER — Inpatient Hospital Stay: Payer: Medicare Other | Admitting: Oncology

## 2017-12-01 ENCOUNTER — Telehealth: Payer: Self-pay | Admitting: Oncology

## 2017-12-01 MED ORDER — PREDNISONE 10 MG PO TABS: 10.0000 mg | ORAL_TABLET | Freq: Every day | ORAL | 0 refills | Status: AC

## 2017-12-01 MED ORDER — AMIODARONE HCL 200 MG PO TABS: 200.0000 mg | ORAL_TABLET | Freq: Two times a day (BID) | ORAL | 0 refills | Status: AC

## 2017-12-01 MED ORDER — DOCUSATE SODIUM 100 MG PO CAPS: 100.0000 mg | ORAL_CAPSULE | Freq: Two times a day (BID) | ORAL | 0 refills | Status: AC

## 2017-12-01 MED ORDER — DILTIAZEM HCL ER COATED BEADS 360 MG PO CP24: 360.0000 mg | ORAL_CAPSULE | Freq: Every day | ORAL | 0 refills | Status: AC

## 2017-12-01 MED ORDER — ARFORMOTEROL TARTRATE 15 MCG/2ML IN NEBU: 15.0000 ug | INHALATION_SOLUTION | Freq: Two times a day (BID) | RESPIRATORY_TRACT | Status: AC

## 2017-12-01 MED ORDER — SALINE SPRAY 0.65 % NA SOLN: 1.0000 | NASAL | 0 refills | Status: AC | PRN

## 2017-12-01 MED ORDER — BENZONATATE 100 MG PO CAPS: 100.0000 mg | ORAL_CAPSULE | Freq: Three times a day (TID) | ORAL | 0 refills | Status: AC

## 2017-12-01 MED ORDER — BUDESONIDE 0.25 MG/2ML IN SUSP: 0.2500 mg | Freq: Two times a day (BID) | RESPIRATORY_TRACT | 12 refills | Status: AC

## 2017-12-01 MED ORDER — POLYETHYLENE GLYCOL 3350 17 G PO PACK: 17.0000 g | PACK | Freq: Every day | ORAL | 0 refills | Status: AC

## 2017-12-01 MED ORDER — MORPHINE SULFATE ER 30 MG PO TBCR: 30.0000 mg | EXTENDED_RELEASE_TABLET | Freq: Two times a day (BID) | ORAL | 0 refills | Status: AC

## 2017-12-01 MED ORDER — LORAZEPAM 0.5 MG PO TABS: 0.5000 mg | ORAL_TABLET | Freq: Four times a day (QID) | ORAL | 0 refills | Status: AC | PRN

## 2017-12-01 NOTE — Progress Notes (Signed)
Palliative care progress note  Reason for consult: Goals of care in light of metastatic disease and continued functional decline  I met again today with Mr. Brouwer and his wife.  He is hopeful to transition to [facility] soon.  His brother was present as well today.  He reports being upset, but understanding that Mr. Rahal is at the end of his life.  - Family is interested in placement at residential hospice for end-of-life care.  With goal for comfort, we have discontinued both abx and anticoagulation.  With PE, postobstructive PNA, and recurrent effusion and a plan for full comfort, I anticipate his prognosis will be likely < 2 weeks.  He has continued symptom burden that will increase moving forward, and I believe that he will be best served by residential hospice.  Total time: 20 minutes Greater than 50%  of this time was spent counseling and coordinating care related to the above assessment and plan.  Micheline Rough, MD [place] Team [phone]

## 2017-12-01 NOTE — Telephone Encounter (Signed)
Cancelled appt per 6/10 schmessage - left message with appt date and time.

## 2017-12-01 NOTE — Progress Notes (Signed)
Patient discharging to [facility]. CSW faxed dc summary to [facility]. CSW contacted [facility], patient's family (wife at bedside) notified.  Patient's RN provided with number to call report, packet complete. CSW signing off, no other needs identified at this time.  Abundio Miu, [place] Social Worker [facility] Cell#: [phone]

## 2017-12-01 NOTE — Progress Notes (Signed)
Report called to Arbie Cookey at [facility]. Stacey Drain

## 2017-12-01 NOTE — Progress Notes (Signed)
[facility]01 -- [facility] ([facility]) at 0900  Met with patient, wife Letta Median and brother Kasandra Knudsen to complete paperwork. Dr. Orpah Melter to assume care per family request. Patient and family agreeable to transfer today.  Abundio Miu, CSW aware.  Please fax discharge summary to [phone].  RN please call report to [phone].  Please arrange transport for patient to arrive before noon if possible.  Please call with any hospice related questions or concerns.  Thank you, Margaretmary Eddy, RN, [facility] Liaison [phone]  [place] are on AM[facility].

## 2017-12-01 NOTE — Discharge Summary (Signed)
[facility] Discharge Summary   Patient: Travis Ferguson. 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 Please establish care and we can place for hospice and follow-up with them.   Diet recommendation: Comfort feed, regular diet  Activity: The patient is advised to gradually reintroduce usual activities.  Discharge Condition: fair  Code Status: DNR/DNI, on comfort care  History of present illness: As per the H and P dictated on admission, "Travis Ferguson. is a 77 y.o. male with history of recently diagnosed non-small cell lung cancer receiving radiation therapy, chronic combined CHF last EF measured this month was 50 to 55% with grade 1 diastolic dysfunction, atrial fibrillation sleep apnea, sarcoidosis who was brought to the ER after radiation therapy since patient became acutely short of breath.  Patient states he has been getting more short of breath last 2 days and has been having low blood pressure and patient oncologist discontinued Lasix and metoprolol.  Patient gets short of breath easily on exertion and has been feeling dizzy on standing.  Denies any chest pain.  Has benign low back pain which patient states has been chronic.  Patient was admitted early part of this month 4 weeks ago and had  thoracentesis done."  Hospital Course:  Summary of his active problems in the hospital is as following. Acute on chronic respiratory failure with hypoxia History of COPD History of OSA Acute pulmonary embolism Recurrent left-sided malignant pleural effusion Postobstructive pneumonia Recently diagnosed with NSCLC S/p Thoracentesis in the ED yield 500 cc  Started radio and chemo this month, follow by Dr Travis Ferguson - case discussed with Dr. Earlie Ferguson he plan to continue active treatment. Underwent pleural catheter placement. Premedicate with pain medication before Pleurx catheter drainage. Stable on 2 L of oxygen at present.  No evidence of COPD exacerbation but has severe bronchospasm versus obstruction causing persistent wheezing, continue scheduled nebulizer. Continue CPAP for OSA at night. Drainage of the Pleurx catheter PRN daily. Nebulizers on discharge for comfort  Acute Pulmonary embolism Started on heparin drip. PCCM not recommending lysis at this time given small anatomic clot burden plus poor prognosis. Now patient wants to remain comfortable and therefore anti-coagulation will be discontinued per patient wishes. Discussed with radiology, repeat CAT scan performed this admission which diagnosed the patient with acute PE appears to have severe motion degraded exam and therefore diagnosis can be equivocal. Regardless patient does not want any anticoagulation for now.  A fib with RVR -RVR resolved In setting of compressive mass Converted to NSR, patient on Amio and Cardizem, rate better controlled now.  Continue cardizem and Amio. Cards have signed off.  Cardizem 360 mg and amiodarone for comfort for rate control  Post obstructive PNA  Continue nebulizer continue O2 supplementation to keep O2 sat >91% Completed 7 days of  antibiotic Complete short course of oral prednisone.  AKI - Cr improved Likely prerenal, from hypotension Avoid nephrotoxic agents and  hypotension  Chronic diastolic CHF  Seems to be euvolemic  Continue to monitor closely Echocardiogram shows EF of 50 to 55% on March 2019. Increased pulmonary pressure likely secondary from OSA.  COPD/OSA Continue CPAP See above   NSCLC  Started radio and chemo this month, follow by Dr Travis Ferguson, poor prognosis  Patient wantsTo transition to complete comfort  Acute urinary retention. Foley catheter was placed in the ICU while the patient was critically ill. Now patient is end of life and his catheter will be continued.  Pain control. Patient has been on PRN pain medication although has not been using it frequently. Regardless patient continues to complain of severe pain and remains tachycardic and tachypneic. With pulmonary embolism, Pleurx catheter placement as well as active cancer suspect the patient's pain is probably driving his symptoms as mentioned above. We will place the patient on scheduled MS Contin low-dose.  Patient will need premedication before Pleurx catheter drainage due to severe pain associated with it.  All other chronic medical condition were stable during the hospitalization.  On the day of the discharge the patient's symptoms were well controlled, and no other acute medical condition were reported by patient. the patient was felt safe to be discharge at [facility] with in patient hospice.  Consultants:  Cardiology  Palliative care  PCCM  IR Procedures:  pleurax catheter placement  DISCHARGE MEDICATION: Allergies as of 12/01/2017      Reactions   Statins Other (See Comments)   Muscle aches - tolerating Vytorin      Medication List    STOP taking these medications   albuterol 108 (90 Base) MCG/ACT inhaler Commonly known as:  PROVENTIL HFA;VENTOLIN HFA   apixaban 5 MG Tabs tablet Commonly known as:  ELIQUIS   atorvastatin 20 MG tablet Commonly known as:  LIPITOR   budesonide-formoterol 160-4.5 MCG/ACT inhaler Commonly known as:   SYMBICORT   cholecalciferol 1000 units tablet Commonly known as:  VITAMIN D   ENTRESTO 24-26 MG Generic drug:  sacubitril-valsartan   finasteride 5 MG tablet Commonly known as:  PROSCAR   FLUoxetine 40 MG capsule Commonly known as:  PROZAC   furosemide 20 MG tablet Commonly known as:  LASIX   metoprolol tartrate 25 MG tablet Commonly known as:  LOPRESSOR   nystatin powder Commonly known as:  MYCOSTATIN/NYSTOP   sulfamethoxazole-trimethoprim 800-160 MG tablet Commonly known as:  BACTRIM DS,SEPTRA DS     TAKE these medications   amiodarone 200 MG tablet Commonly known as:  PACERONE Take 1 tablet (200 mg total) by mouth 2 (two) times daily. What changed:    medication strength  how much to take  when to take this   arformoterol 15 MCG/2ML Nebu Commonly known as:  BROVANA Take 2 mLs (15 mcg total) by nebulization 2 (two) times daily.   benzonatate 100 MG capsule Commonly known as:  TESSALON Take 1 capsule (100 mg total) by mouth 3 (three) times daily. What changed:    medication strength  how much to take  when to take this  reasons to take this   budesonide 0.25 MG/2ML nebulizer solution Commonly known as:  PULMICORT Take 2 mLs (0.25 mg total) by nebulization 2 (two) times daily.   diltiazem 360 MG 24 hr capsule Commonly known as:  CARDIZEM CD Take 1 capsule (360 mg total) by mouth daily.   docusate sodium 100  MG capsule Commonly known as:  COLACE Take 1 capsule (100 mg total) by mouth 2 (two) times daily.   DULoxetine 30 MG capsule Commonly known as:  CYMBALTA Take 30 mg by mouth daily.   guaiFENesin 600 MG 12 hr tablet Commonly known as:  MUCINEX Take 1 tablet (600 mg total) by mouth 2 (two) times daily.   HYDROcodone-acetaminophen 5-325 MG tablet Commonly known as:  NORCO/VICODIN Take 1 tablet by mouth every 8 (eight) hours as needed for moderate pain.   IMODIUM A-D PO Take 2 tablets by mouth daily as needed (diarrhea).   ipratropium  0.02 % nebulizer solution Commonly known as:  ATROVENT Take 2.5 mLs (0.5 mg total) by nebulization 4 (four) times daily.   levalbuterol 1.25 MG/0.5ML nebulizer solution Commonly known as:  XOPENEX Take 1.25 mg by nebulization every 8 (eight) hours as needed for wheezing or shortness of breath.   LORazepam 0.5 MG tablet Commonly known as:  ATIVAN Take 1 tablet (0.5 mg total) by mouth every 6 (six) hours as needed for anxiety.   morphine 30 MG 12 hr tablet Commonly known as:  MS CONTIN Take 1 tablet (30 mg total) by mouth every 12 (twelve) hours.   OXYGEN Inhale 2 L/min into the lungs at bedtime.   polyethylene glycol packet Commonly known as:  MIRALAX / GLYCOLAX Take 17 g by mouth daily.   predniSONE 10 MG tablet Commonly known as:  DELTASONE Take 1 tablet (10 mg total) by mouth daily with breakfast. Start taking on:  12/02/2017   PROBIOTIC PO Take 1 tablet by mouth daily.   prochlorperazine 10 MG tablet Commonly known as:  COMPAZINE Take 1 tablet (10 mg total) by mouth every 6 (six) hours as needed for nausea or vomiting.   sodium chloride 0.65 % Soln nasal spray Commonly known as:  OCEAN Place 1 spray into both nostrils as needed for congestion.      Allergies  Allergen Reactions  . Statins Other (See Comments)    Muscle aches - tolerating Vytorin   Discharge Instructions    Diet general   Complete by:  As directed    Increase activity slowly   Complete by:  As directed      Discharge Exam: Filed Weights   11/27/17 0443 11/28/17 0500 11/29/17 0350  Weight: 112.2 kg (247 lb 5.7 oz) 112.8 kg (248 lb 9.6 oz) 113.6 kg (250 lb 6.4 oz)   Vitals:   12/01/17 0556 12/01/17 0745  BP: 121/74   Pulse: (!) 102   Resp: 20   Temp: 97.6 F (36.4 C)   SpO2: 95% 93%   General: Appear in mild distress, no Rash; Oral Mucosa moist. Cardiovascular: S1 and S2 Present, no Murmur, no JVD Respiratory: Bilateral Air entry present and bilateral  Crackles, bilateral expiratory   wheezes Abdomen: Bowel Sound present, Soft and no tenderness Extremities: trace Pedal edema, no calf tenderness Neurology: Grossly no focal neuro deficit.  The results of significant diagnostics from this hospitalization (including imaging, microbiology, ancillary and laboratory) are listed below for reference.    Significant Diagnostic Studies: Ct Angio Chest Pe W And/or Wo Contrast  Result Date: 11/20/2017 CLINICAL DATA:  Brought from cancer center post radiation treatment, Pt presents with dyspnea, diaphoretic, hypertensive and reported hypoxia, sp02 low 80s, placed on 6lts at center. Sp02 on arrival 99 on 2ltr nasal canula. History of stage IV lung cancer. EXAM: CT ANGIOGRAPHY CHEST WITH CONTRAST TECHNIQUE: Multidetector CT imaging of the chest was performed using the  standard protocol during bolus administration of intravenous contrast. Multiplanar CT image reconstructions and MIPs were obtained to evaluate the vascular anatomy. CONTRAST:  72mL ISOVUE-370 IOPAMIDOL (ISOVUE-370) INJECTION 76% COMPARISON:  11/11/2017, 10/23/2017 FINDINGS: Cardiovascular: There is significant artifact from numerous calcified mediastinal and hilar lymph nodes. The pulmonary arteries are well opacified. There are filling defects within small RIGHT LOWER lobe pulmonary arteries consistent with pulmonary embolus. There is marked narrowing of the LEFT pulmonary artery secondary to mediastinal and hilar mass. There is obstruction of the LEFT pulmonary veins, stable in appearance. Coronary artery calcifications are present. Mediastinum/Nodes: Stable, large soft tissue mass involving the LEFT hilum and mediastinum. Lungs/Pleura: There is near complete obstruction of the LEFT mainstem bronchus. There is significant effacement of the RIGHT mainstem bronchus. There is near complete opacification of the LEFT lung with significantly less aerated lung in the UPPER lobe compared to prior studies. Findings are consistent with a  combination of tumor and atelectasis. There are small amounts of pleural air anteriorly on the LEFT following thoracentesis earlier today. Moderate pleural effusion persists. Upper Abdomen: Cholecystectomy. Musculoskeletal: Degenerative changes in the thoracic spine. No suspicious lytic or blastic lesions. Review of the MIP images confirms the above findings. IMPRESSION: 1. Suspect small pulmonary emboli within the RIGHT LOWER lobe pulmonary artery branches. 2. Near complete obstruction of the LEFT mainstem bronchus. This results in near complete atelectasis of the LEFT UPPER and LOWER lobes. 3. Persistent moderate LEFT pleural effusion. 4. Extrinsic compression and resulting effacement of the LEFT pulmonary arteries. 5. Large mediastinal and LEFT hilar mass, stable in appearance. These results were called by telephone at the time of interpretation on 11/20/2017 at 6:43 pm to Dr. Shirlyn Goltz , who verbally acknowledged these results. Electronically Signed   By: Nolon Nations M.D.   On: 11/20/2017 18:46   Mr Jeri Cos EZ Contrast  Result Date: 11/11/2017 CLINICAL DATA:  Non-small cell lung cancer, staging. EXAM: MRI HEAD WITHOUT AND WITH CONTRAST TECHNIQUE: Multiplanar, multiecho pulse sequences of the brain and surrounding structures were obtained without and with intravenous contrast. CONTRAST:  12mL MULTIHANCE GADOBENATE DIMEGLUMINE 529 MG/ML IV SOLN COMPARISON:  Most recent MRI of the brain 07/20/2016. FINDINGS: Brain: No evidence for acute infarction, hemorrhage, mass lesion, hydrocephalus, or extra-axial fluid. Generalized atrophy. Mild subcortical and periventricular T2 and FLAIR hyperintensities, likely chronic microvascular ischemic change. No abnormal enhancement of the brain. Minor dural enhancement on the RIGHT represents previous craniotomy for subdural hematoma. Vascular: Flow voids are maintained. Mild chronic hemorrhage of the subarachnoid spaces over the RIGHT frontal lobe. Skull and upper cervical  spine: Partial empty sella. Marrow signal unremarkable. RIGHT frontotemporoparietal craniotomy appears uncomplicated. Sinuses/Orbits: No layering sinus fluid.  Negative orbits. Other: Unremarkable mastoid air cells. IMPRESSION: Generalized atrophy. Mild small vessel disease. Postsurgical changes following RIGHT subdural hematoma evacuation several years ago. No acute intracranial findings. No abnormal postcontrast enhancement of the brain to suggest intracranial metastatic disease. Electronically Signed   By: Staci Righter M.D.   On: 11/11/2017 16:56   [place] Chest (pleural Effusion)  Result Date: 11/24/2017 CLINICAL DATA:  Pleural effusion on CT and chest radiograph EXAM: CHEST ULTRASOUND COMPARISON:  CT 11/20/2017 chest radiograph 12/21/2017 FINDINGS: Large LEFT pleural effusion IMPRESSION: Large LEFT pleural effusion. Electronically Signed   By: Suzy Bouchard M.D.   On: 11/24/2017 17:18   Ir Guided Niel Hummer W Catheter Placement  Result Date: 11/25/2017 INDICATION: Left lung malignancy.  Left pleural effusion. EXAM: LEFT PLEURX DRAIN PLACEMENT MEDICATIONS: The patient is currently admitted  to the hospital and receiving intravenous antibiotics. The antibiotics were administered within an appropriate time frame prior to the initiation of the procedure. ANESTHESIA/SEDATION: Fentanyl 50 mcg IV; Versed 1 mg IV Moderate Sedation Time:  15 minutes The patient was continuously monitored during the procedure by the interventional radiology nurse under my direct supervision. COMPLICATIONS: None immediate. PROCEDURE: Informed written consent was obtained from the patient after a thorough discussion of the procedural risks, benefits and alternatives. All questions were addressed. Maximal Sterile Barrier Technique was utilized including caps, mask, sterile gowns, sterile gloves, sterile drape, hand hygiene and skin antiseptic. A timeout was performed prior to the initiation of the procedure. The left flank was prepped and  draped in a sterile fashion. 1% lidocaine was utilized for local anesthesia. Under sonographic guidance, an 18 gauge needle was advanced into the left pleural space and removed over an Amplatz wire. The 16 French peel-away sheath was inserted. A second incision was made 10 cm anterior to the initial puncture site. The PleurX drain was then advanced from the second incision out the puncture site incision. It was then fed through the peel-away sheath. The peel-away sheath was removed. The puncture site incision was closed with a 3 0 Vicryl subcutaneous stitch and 4 0 Vicryl subcuticular stitch. The second incision was closed with an 0 Prolene pursestring knot. Dermabond was applied. 1200 cc clear yellow fluid was aspirated. FINDINGS: Imaging documents placement of a left PleurX drain. IMPRESSION: Successful left PleurX drain placement. 1200 cc fluid was obtained from the left PleurX drain. Electronically Signed   By: Marybelle Killings M.D.   On: 11/25/2017 16:19   Nm Pet Image Initial (pi) Skull Base To Thigh  Result Date: 11/11/2017 CLINICAL DATA:  Initial treatment strategy for lung cancer. EXAM: NUCLEAR MEDICINE PET SKULL BASE TO THIGH TECHNIQUE: 12.1 mCi F-18 FDG was injected intravenously. Full-ring PET imaging was performed from the skull base to thigh after the radiotracer. CT data was obtained and used for attenuation correction and anatomic localization. Fasting blood glucose: 132 mg/dl COMPARISON:  Chest CT 10/23/2017 FINDINGS: Mediastinal blood pool activity: SUV max 2.86 NECK: No hypermetabolic lymph nodes in the neck. Incidental CT findings: none CHEST: Large necrotic central left lung mass invading the left hilum and mediastinum. Marked hypermetabolism with SUV max of 15.9. There is associated obstructed atelectatic left lower lobe and lingula. Large left pleural effusion. No obvious pleural lesions. 12 mm hypermetabolic prevascular lymph node on image number 69 with SUV max of 7.7. There are also  numerous calcified mediastinal and hilar lymph nodes consistent with patient's known sarcoidosis. I do not see any hypermetabolic contralateral adenopathy. No findings to suggest pulmonary metastatic disease. Calcified granuloma noted in the right middle lobe. Incidental CT findings: Extensive coronary artery calcifications. ABDOMEN/PELVIS: No abnormal hypermetabolic activity within the liver, pancreas, adrenal glands, or spleen. No hypermetabolic lymph nodes in the abdomen or pelvis. Incidental CT findings: none SKELETON: No focal hypermetabolic activity to suggest skeletal metastasis. Incidental CT findings: none IMPRESSION: 1. Large central left lung mass invading the hilum and mediastinum demonstrating marked hypermetabolism and consistent with known squamous cell carcinoma. Associated obstructed/atelectatic left lingula and lower lobe. Large left pleural effusion. 2. Hypermetabolic prevascular lymph node consistent with metastatic disease. 3. No findings for abdominal/pelvic metastatic disease or osseous metastatic disease. 4. Calcified mediastinal and hilar lymph nodes consistent with known sarcoidosis. Electronically Signed   By: Marijo Sanes M.D.   On: 11/11/2017 16:23   Ir US Guide Bx Asp/drain  Result  Date: 11/25/2017 INDICATION: Left lung malignancy.  Left pleural effusion. EXAM: LEFT PLEURX DRAIN PLACEMENT MEDICATIONS: The patient is currently admitted to the hospital and receiving intravenous antibiotics. The antibiotics were administered within an appropriate time frame prior to the initiation of the procedure. ANESTHESIA/SEDATION: Fentanyl 50 mcg IV; Versed 1 mg IV Moderate Sedation Time:  15 minutes The patient was continuously monitored during the procedure by the interventional radiology nurse under my direct supervision. COMPLICATIONS: None immediate. PROCEDURE: Informed written consent was obtained from the patient after a thorough discussion of the procedural risks, benefits and  alternatives. All questions were addressed. Maximal Sterile Barrier Technique was utilized including caps, mask, sterile gowns, sterile gloves, sterile drape, hand hygiene and skin antiseptic. A timeout was performed prior to the initiation of the procedure. The left flank was prepped and draped in a sterile fashion. 1% lidocaine was utilized for local anesthesia. Under sonographic guidance, an 18 gauge needle was advanced into the left pleural space and removed over an Amplatz wire. The 16 French peel-away sheath was inserted. A second incision was made 10 cm anterior to the initial puncture site. The PleurX drain was then advanced from the second incision out the puncture site incision. It was then fed through the peel-away sheath. The peel-away sheath was removed. The puncture site incision was closed with a 3 0 Vicryl subcutaneous stitch and 4 0 Vicryl subcuticular stitch. The second incision was closed with an 0 Prolene pursestring knot. Dermabond was applied. 1200 cc clear yellow fluid was aspirated. FINDINGS: Imaging documents placement of a left PleurX drain. IMPRESSION: Successful left PleurX drain placement. 1200 cc fluid was obtained from the left PleurX drain. Electronically Signed   By: Marybelle Killings M.D.   On: 11/25/2017 16:19   Dg Chest Port 1 View  Result Date: 11/24/2017 CLINICAL DATA:  Follow-up left pleural effusion. EXAM: PORTABLE CHEST 1 VIEW COMPARISON:  Portable chest x-ray of November 22, 2017 FINDINGS: There remains near total opacification of the left hemithorax. Marked mediastinal shift is not observed though mild shift may be present. A tiny amount of aerated lung remains in the apex. The right lung is hypoinflated but grossly clear. 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 IMPRESSION: Persistent near-total opacification of the left hemithorax due to postobstructive atelectasis or effusion. Electronically Signed   By: David  Martinique M.D.   On: 11/24/2017 10:03   Dg Chest Port 1  View  Result Date: 11/22/2017 CLINICAL DATA:  Acute respiratory failure.  Followup exam. EXAM: PORTABLE CHEST 1 VIEW COMPARISON:  11/21/2017 and older exams. FINDINGS: Complete opacification of the left hemithorax is unchanged from the most recent prior exam. Right lung is clear. No pneumothorax. IMPRESSION: 1. No change from the most recent prior exam. Persistent complete opacification of the left hemithorax. Right lung remains clear. Electronically Signed   By: Lajean Manes M.D.   On: 11/22/2017 07:35   Dg Chest Port 1 View  Result Date: 11/21/2017 CLINICAL DATA:  Evaluate pleural effusion EXAM: PORTABLE CHEST 1 VIEW COMPARISON:  11/20/2017 FINDINGS: Complete whiteout of the left hemithorax again noted, stable. No confluent opacity on the right. Improved lung volumes on the right. Right hilar fullness is likely vascular when compared to prior chest CT. IMPRESSION: Continued complete opacification of the left hemithorax. Electronically Signed   By: Rolm Baptise M.D.   On: 11/21/2017 09:18   Dg Chest Port 1 View  Result Date: 11/20/2017 CLINICAL DATA:  Post thoracentesis. EXAM: PORTABLE CHEST 1 VIEW COMPARISON:  Chest x-ray Nov 20, 2017 FINDINGS: Complete opacification left hemithorax remains. No definitive pneumothorax. The right lung remains clear. No other changes. IMPRESSION: Continued complete opacification of the left hemithorax without convincing evidence of pneumothorax. Electronically Signed   By: Dorise Bullion III M.D   On: 11/20/2017 18:45   Dg Chest Port 1 View  Result Date: 11/20/2017 CLINICAL DATA:  History of lung carcinoma EXAM: PORTABLE CHEST 1 VIEW COMPARISON:  PET-CT of 11/11/2017 and chest x-ray of 10/24/2017 FINDINGS: There is now near complete opacification of the left hemithorax with slight mediastinal shift to the right. This likely suggests a large right pleural effusion. Heart size is difficult to assess. The right lung is clear. Reverse left shoulder arthroplasty is noted.  IMPRESSION: 1. Near complete opacification of the left hemithorax with some mediastinal shift to the right indicating a large left pleural effusion. 2. The right lung is grossly clear. Electronically Signed   By: Ivar Drape M.D.   On: 11/20/2017 17:04    Microbiology: No results found for this or any previous visit (from the past 240 hour(s)).   Labs: CBC: Recent Labs  Lab 11/25/17 0231 11/26/17 0419 11/27/17 0502 11/28/17 0515  WBC 26.0* 20.9* 25.3* 21.9*  HGB 9.6* 9.4* 10.0* 10.4*  HCT 30.4* 30.0* 31.9* 32.3*  MCV 84.2 85.0 85.5 84.3  PLT 190 170 181 329   Basic Metabolic Panel: Recent Labs  Lab 11/25/17 0231 11/26/17 0419 11/27/17 0502 11/28/17 0515 11/29/17 0513  NA 135 138 137 136 136  K 4.4 4.4 4.3 4.6 4.6  CL 100* 100* 100* 99* 100*  CO2 26 30 28 29 27   GLUCOSE 168* 181* 182* 131* 131*  BUN 31* 34* 31* 30* 27*  CREATININE 1.00 1.02 0.88 0.79 0.83  CALCIUM 8.7* 8.7* 8.4* 8.4* 8.3*  MG 1.9 2.1 1.9  --   --    BNP (last 3 results) Recent Labs    10/09/17 1150 10/23/17 1513 11/20/17 1620  BNP 106.1* 147.5* 445.0*   CBG: Recent Labs  Lab 11/28/17 0736 11/28/17 1616 11/29/17 0017 11/29/17 0746 11/30/17 1358  GLUCAP 223* 185* 146* 128* 183*   Time spent: 35 minutes  Signed:  Berle Mull  [facility]  12/01/2017  , 8:32 AM

## 2017-12-01 NOTE — Progress Notes (Signed)
Pt complaining of increased SOB. RRT at bedside to adminster breathing tx. Pt with no relief. Pt requesting to have pleurx catheter drained. Pt premedicated with morphine. Materials brought to bedside. 1000 ml of orange fluid obtained. Will continue to monitor.

## 2017-12-02 ENCOUNTER — Inpatient Hospital Stay: Payer: Medicare Other

## 2017-12-02 ENCOUNTER — Ambulatory Visit: Payer: Medicare Other

## 2017-12-03 ENCOUNTER — Encounter: Payer: Self-pay | Admitting: Radiation Oncology

## 2017-12-03 ENCOUNTER — Ambulatory Visit: Payer: Medicare Other

## 2017-12-04 ENCOUNTER — Encounter: Payer: Self-pay | Admitting: Pulmonary Disease

## 2017-12-04 ENCOUNTER — Encounter: Payer: Self-pay | Admitting: Internal Medicine

## 2017-12-04 ENCOUNTER — Ambulatory Visit: Payer: Medicare Other

## 2017-12-04 NOTE — Telephone Encounter (Signed)
Routing to VS as an [place]

## 2017-12-05 ENCOUNTER — Ambulatory Visit: Payer: Medicare Other

## 2017-12-08 ENCOUNTER — Ambulatory Visit: Payer: Medicare Other

## 2017-12-09 ENCOUNTER — Ambulatory Visit: Payer: Medicare Other

## 2017-12-09 ENCOUNTER — Other Ambulatory Visit: Payer: Medicare Other

## 2017-12-09 ENCOUNTER — Ambulatory Visit: Payer: Medicare Other | Admitting: Cardiovascular Disease

## 2017-12-10 ENCOUNTER — Ambulatory Visit: Payer: Medicare Other

## 2017-12-11 ENCOUNTER — Ambulatory Visit: Payer: Medicare Other

## 2017-12-12 ENCOUNTER — Ambulatory Visit: Payer: Medicare Other

## 2017-12-16 ENCOUNTER — Other Ambulatory Visit: Payer: Medicare Other

## 2017-12-18 ENCOUNTER — Telehealth: Payer: Self-pay | Admitting: Pulmonary Disease

## 2017-12-18 NOTE — Telephone Encounter (Signed)
Created in error

## 2017-12-22 ENCOUNTER — Ambulatory Visit: Payer: Medicare Other | Admitting: Internal Medicine

## 2017-12-22 ENCOUNTER — Ambulatory Visit: Payer: Medicare Other

## 2017-12-22 ENCOUNTER — Other Ambulatory Visit: Payer: Medicare Other

## 2017-12-22 NOTE — Progress Notes (Signed)
  Radiation Oncology         [phone]) [phone] ________________________________  Name: Travis Ferguson. MRN: [id number]  Date: December 11, 2017  DOB: [birth year]  End of Treatment Note  Diagnosis:  Stage IV squamous cell carcinoma of left lung  Indication for treatment: Palliative  Radiation treatment dates: 11/18/2017 - 11/20/2017  Site/dose:   Lung_Lt/ 30 Gy delivered in 10 fractions of 3 Gy  Beams/energy:  3D/ 15X, 10X  Narrative: The patient's health declined and he has chosen to discontinue radiation treatment after 3 fractions/ 7.5 Gy.  Plan: The patient will return to radiation oncology clinic for routine followup in one month. I advised them to call or return sooner if they have any questions or concerns related to their recovery or treatment.  ------------------------------------------------  Jodelle Gross, MD, PhD  This document serves as a record of services personally performed by Kyung Rudd, MD. It was created on his behalf by Wilburn Mylar, a trained medical scribe. The creation of this record is based on the scribe's personal observations and the provider's statements to them. This document has been checked and approved by the attending provider.

## 2017-12-22 DEATH — deceased

## 2017-12-23 ENCOUNTER — Ambulatory Visit: Payer: Medicare Other

## 2017-12-24 ENCOUNTER — Ambulatory Visit: Payer: Medicare Other

## 2017-12-30 ENCOUNTER — Other Ambulatory Visit: Payer: Medicare Other

## 2018-01-06 ENCOUNTER — Other Ambulatory Visit: Payer: Medicare Other

## 2018-01-12 ENCOUNTER — Other Ambulatory Visit: Payer: Medicare Other

## 2018-01-12 ENCOUNTER — Ambulatory Visit: Payer: Medicare Other

## 2018-01-12 ENCOUNTER — Ambulatory Visit: Payer: Medicare Other | Admitting: Internal Medicine

## 2018-01-13 ENCOUNTER — Ambulatory Visit: Payer: Medicare Other

## 2018-02-03 ENCOUNTER — Ambulatory Visit: Payer: Medicare Other

## 2018-11-13 IMAGING — DX DG CHEST 1V PORT
1 series · 1 of 1 positions shown · non-contrast
Comparison: Chest x-ray November 20, 2017

CLINICAL DATA: Post thoracentesis.

EXAM:
PORTABLE CHEST 1 VIEW

[chest ap]
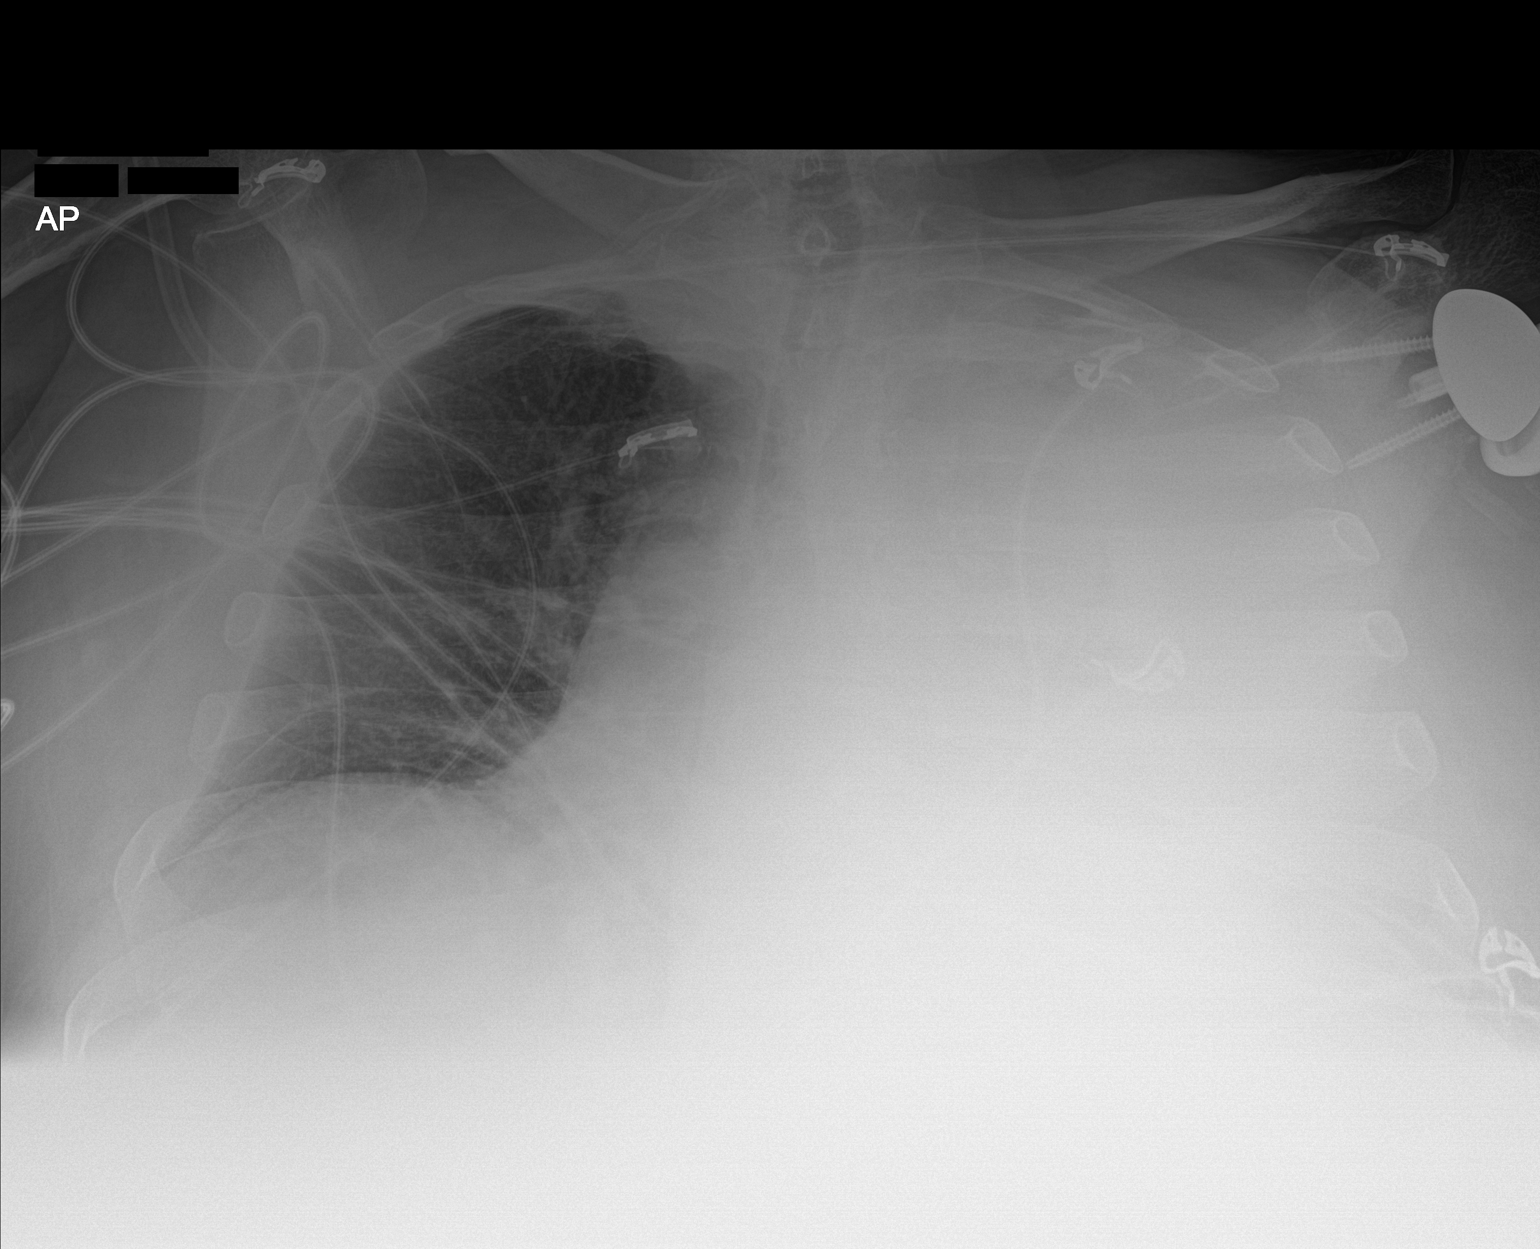

[1 of 1 positions shown; findings below may reference images not displayed]

FINDINGS: Complete opacification left hemithorax remains. No definitive
pneumothorax. The right lung remains clear. No other changes.
IMPRESSION: Continued complete opacification of the left hemithorax without
convincing evidence of pneumothorax.

## 2018-11-13 IMAGING — CT CT ANGIO CHEST
3 of 8 series · 18 of 36 positions shown · IV contrast (ISOVUE)
Comparison: 11/11/2017, 10/23/2017

CLINICAL DATA: Brought from [HOSPITAL] post radiation treatment,
Pt presents with dyspnea, diaphoretic, hypertensive and reported
hypoxia, sp02 low 80s, placed on 6lts at center. Sp02 on arrival 99
on 2ltr nasal canula. History of stage IV lung cancer.

EXAM:
CT ANGIOGRAPHY CHEST WITH CONTRAST
TECHNIQUE: Multidetector CT imaging of the chest was performed using the
standard protocol during bolus administration of intravenous
contrast. Multiplanar CT image reconstructions and MIPs were
obtained to evaluate the vascular anatomy.
CONTRAST:  80mL CZOWYZ-G0U IOPAMIDOL (CZOWYZ-G0U) INJECTION 76%

[Series 5: thins · axial · 0.86mm/px · z∈[+154,+406]mm · 15 of 288 slices shown (1 of 2)]
[im 18/288  lung]
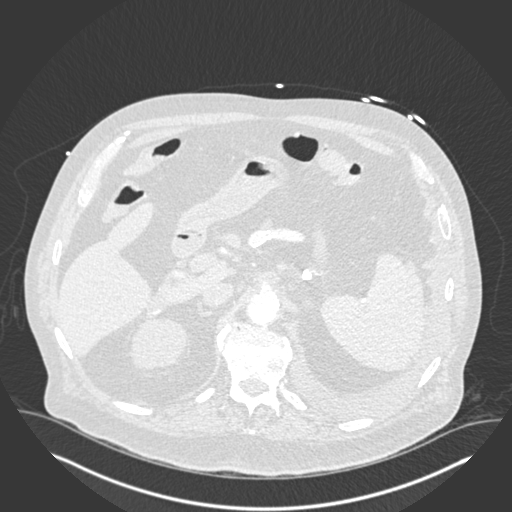
[im 36/288  mediastinal]
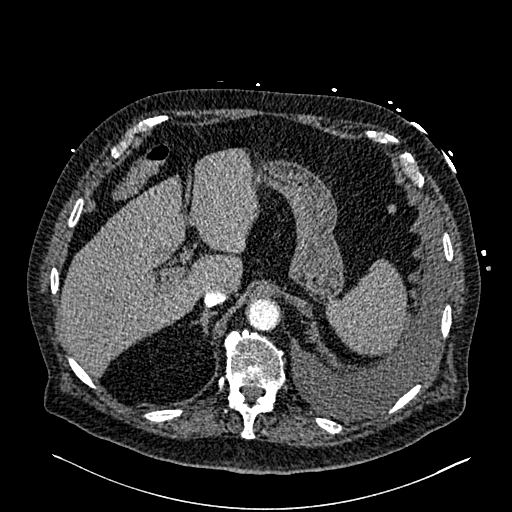
[im 54/288  lung]
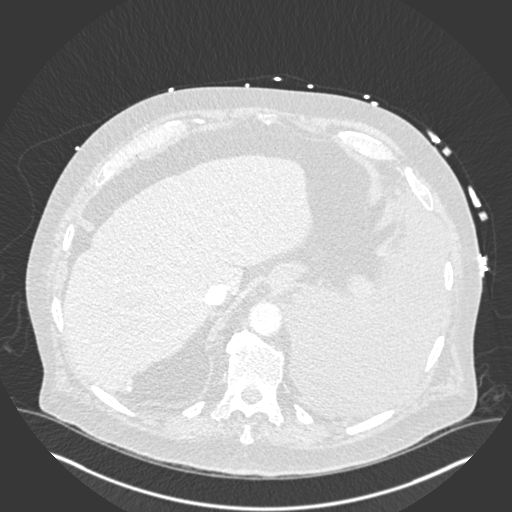
[im 72/288  mediastinal]
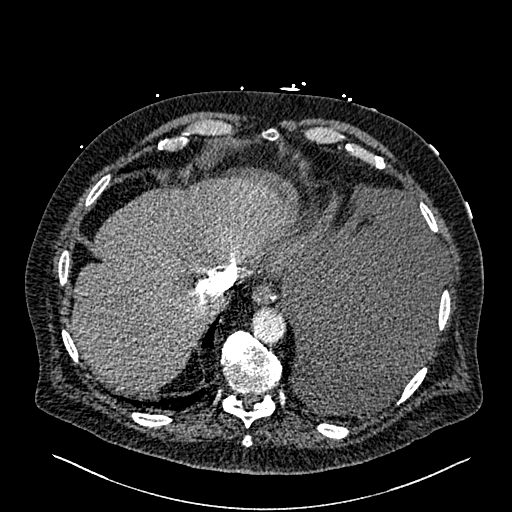
[im 90/288  lung]
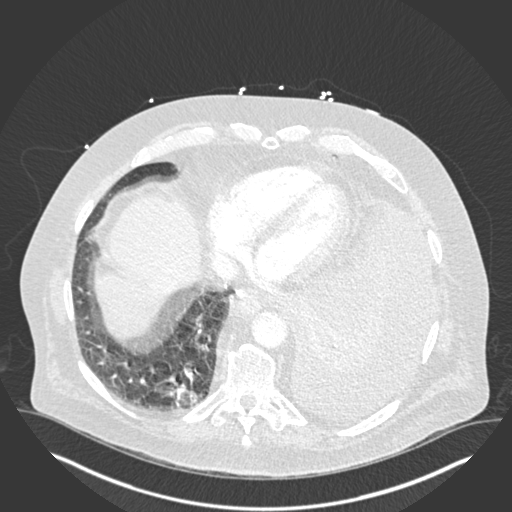
[im 108/288  mediastinal]
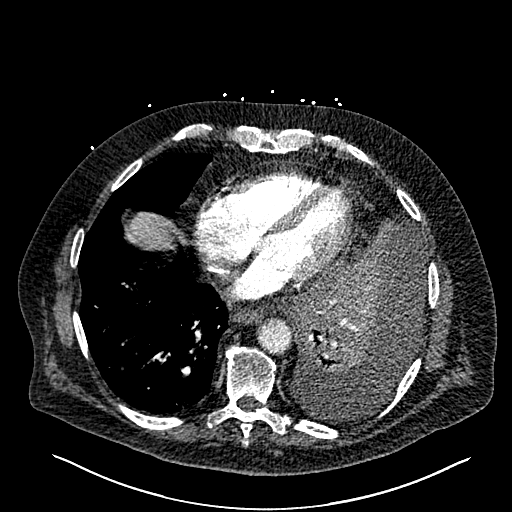
[im 126/288  lung]
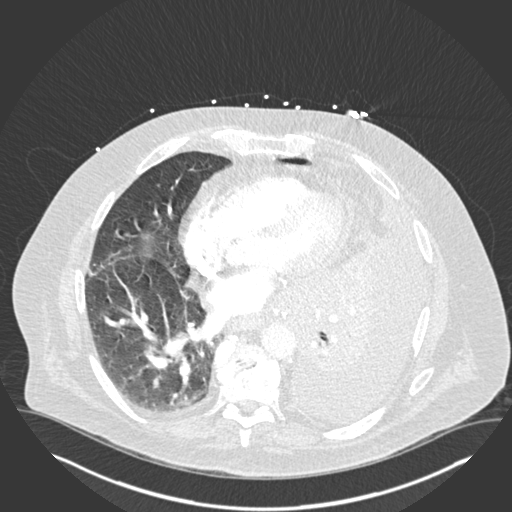
[im 144/288  mediastinal]
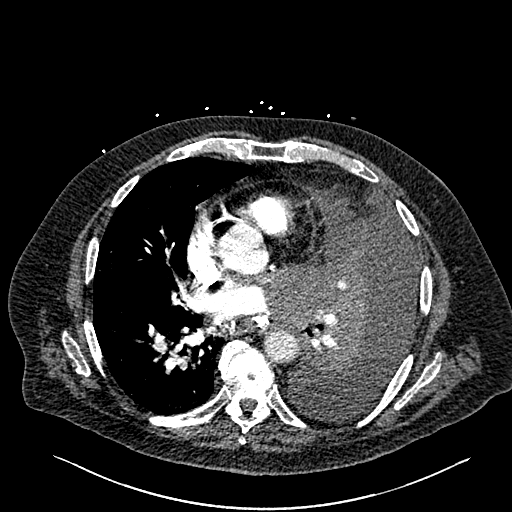
[im 162/288  lung]
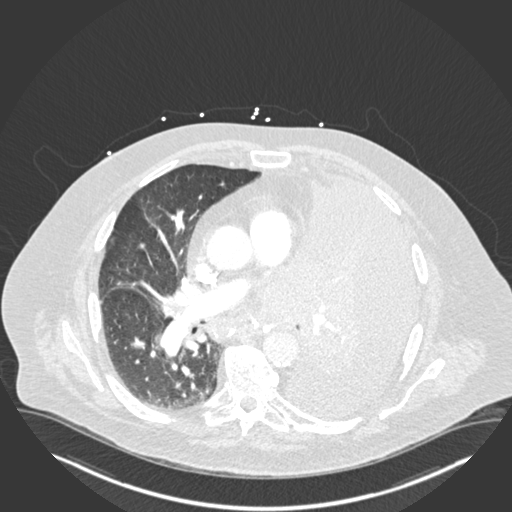
[im 180/288  mediastinal]
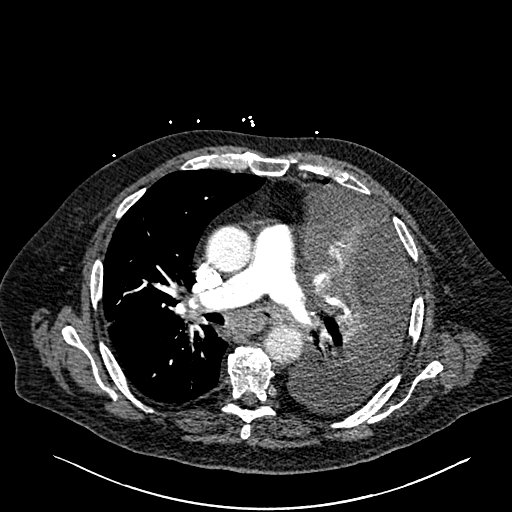
[im 198/288  lung]
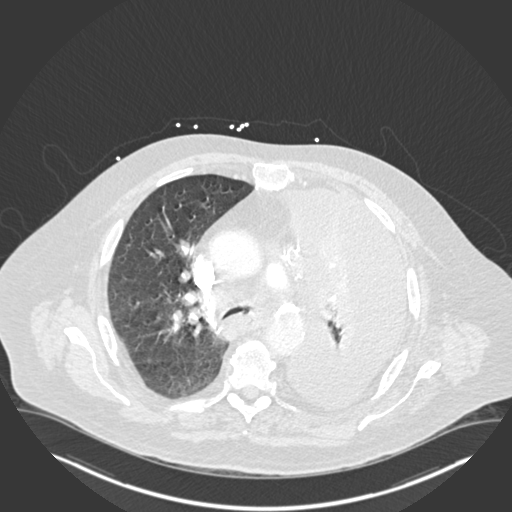
[im 216/288  mediastinal]
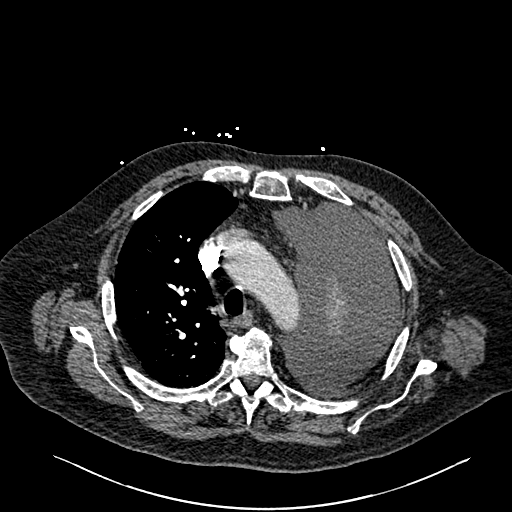
[im 234/288  lung]
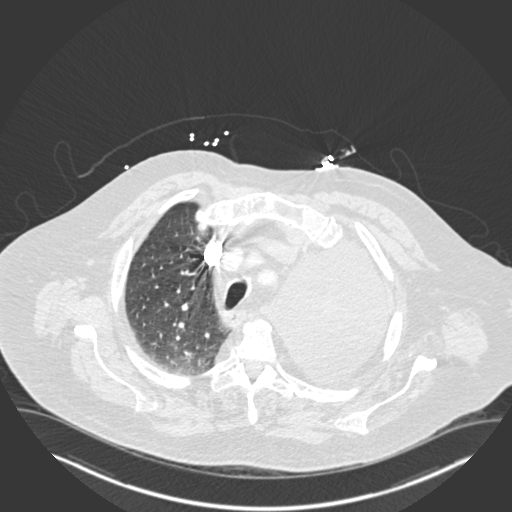
[im 252/288  mediastinal]
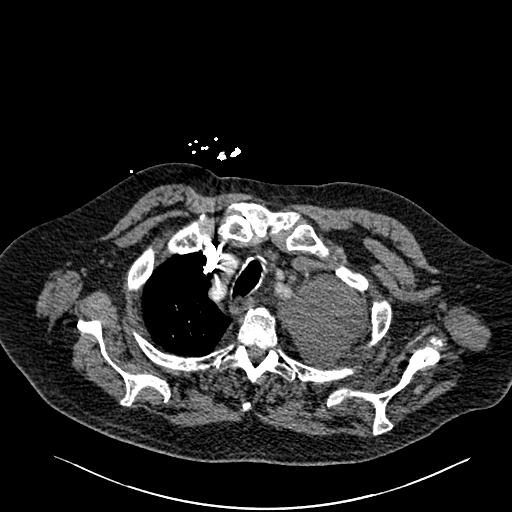
[im 270/288  lung]
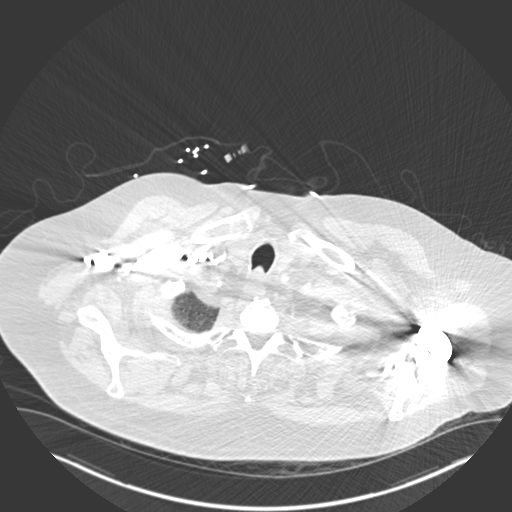

[Series 7: thins · axial · 0.86mm/px · z∈[+111,+137]mm · 2 of 78 slices shown (2 of 2)]
[im 26/78  lung]
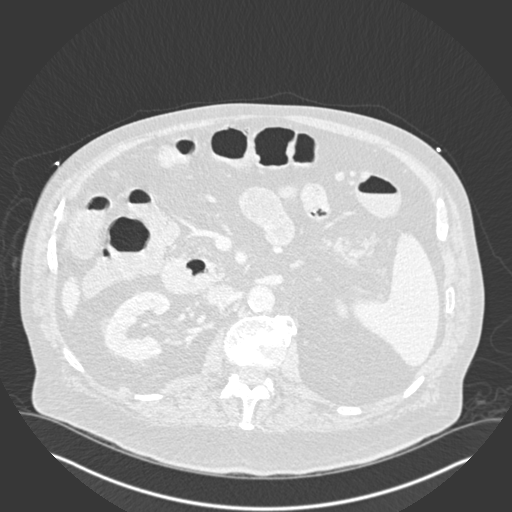
[im 52/78  lung]
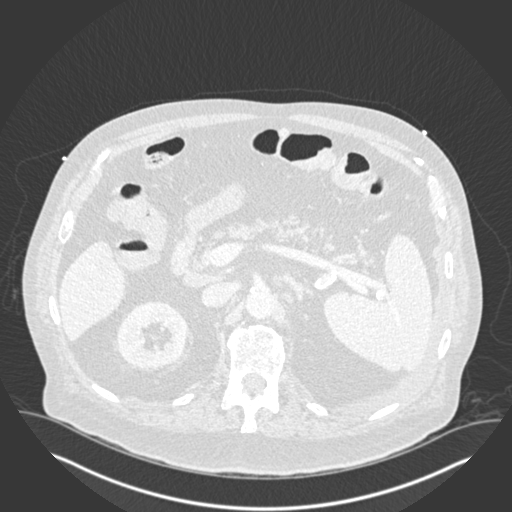

[Series 8: coronal mpr · coronal · 0.59mm/px · 1 of 173 slices shown]
[im 87/173  mediastinal]
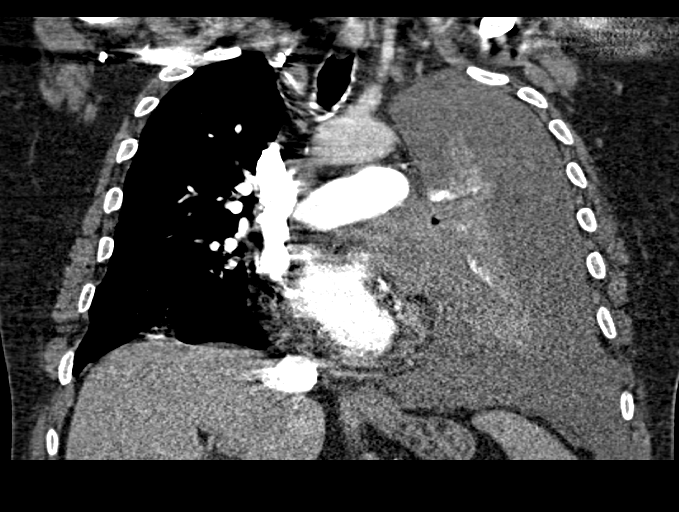

[18 of 36 positions shown; findings below may reference images not displayed]

FINDINGS: Cardiovascular: There is significant artifact from numerous
calcified mediastinal and hilar lymph nodes. The pulmonary arteries
are well opacified. There are filling defects within small RIGHT
LOWER lobe pulmonary arteries consistent with pulmonary embolus.
There is marked narrowing of the LEFT pulmonary artery secondary to
mediastinal and hilar mass. There is obstruction of the LEFT
pulmonary veins, stable in appearance. Coronary artery
calcifications are present.

Mediastinum/Nodes: Stable, large soft tissue mass involving the LEFT
hilum and mediastinum.

Lungs/Pleura: There is near complete obstruction of the LEFT
mainstem bronchus. There is significant effacement of the RIGHT
mainstem bronchus. There is near complete opacification of the LEFT
lung with significantly less aerated lung in the UPPER lobe compared
to prior studies. Findings are consistent with a combination of
tumor and atelectasis. There are small amounts of pleural air
anteriorly on the LEFT following thoracentesis earlier today.
Moderate pleural effusion persists.

Upper Abdomen: Cholecystectomy.

Musculoskeletal: Degenerative changes in the thoracic spine. No
suspicious lytic or blastic lesions.

Review of the MIP images confirms the above findings.
IMPRESSION: 1. Suspect small pulmonary emboli within the RIGHT LOWER lobe
pulmonary artery branches.
2. Near complete obstruction of the LEFT mainstem bronchus. This
results in near complete atelectasis of the LEFT UPPER and LOWER
lobes.
3. Persistent moderate LEFT pleural effusion.
4. Extrinsic compression and resulting effacement of the LEFT
pulmonary arteries.
5. Large mediastinal and LEFT hilar mass, stable in appearance.

These results were called by telephone at the time of interpretation
on 11/20/2017 at [DATE] to Dr. YOGBAN PEGUERO , who verbally acknowledged
these results.

## 2018-11-13 IMAGING — DX DG CHEST 1V PORT
1 series · 1 of 1 positions shown · non-contrast
Comparison: PET-CT of 11/11/2017 and chest x-ray of 10/24/2017

CLINICAL DATA: History of lung carcinoma

EXAM:
PORTABLE CHEST 1 VIEW

[chest ap]
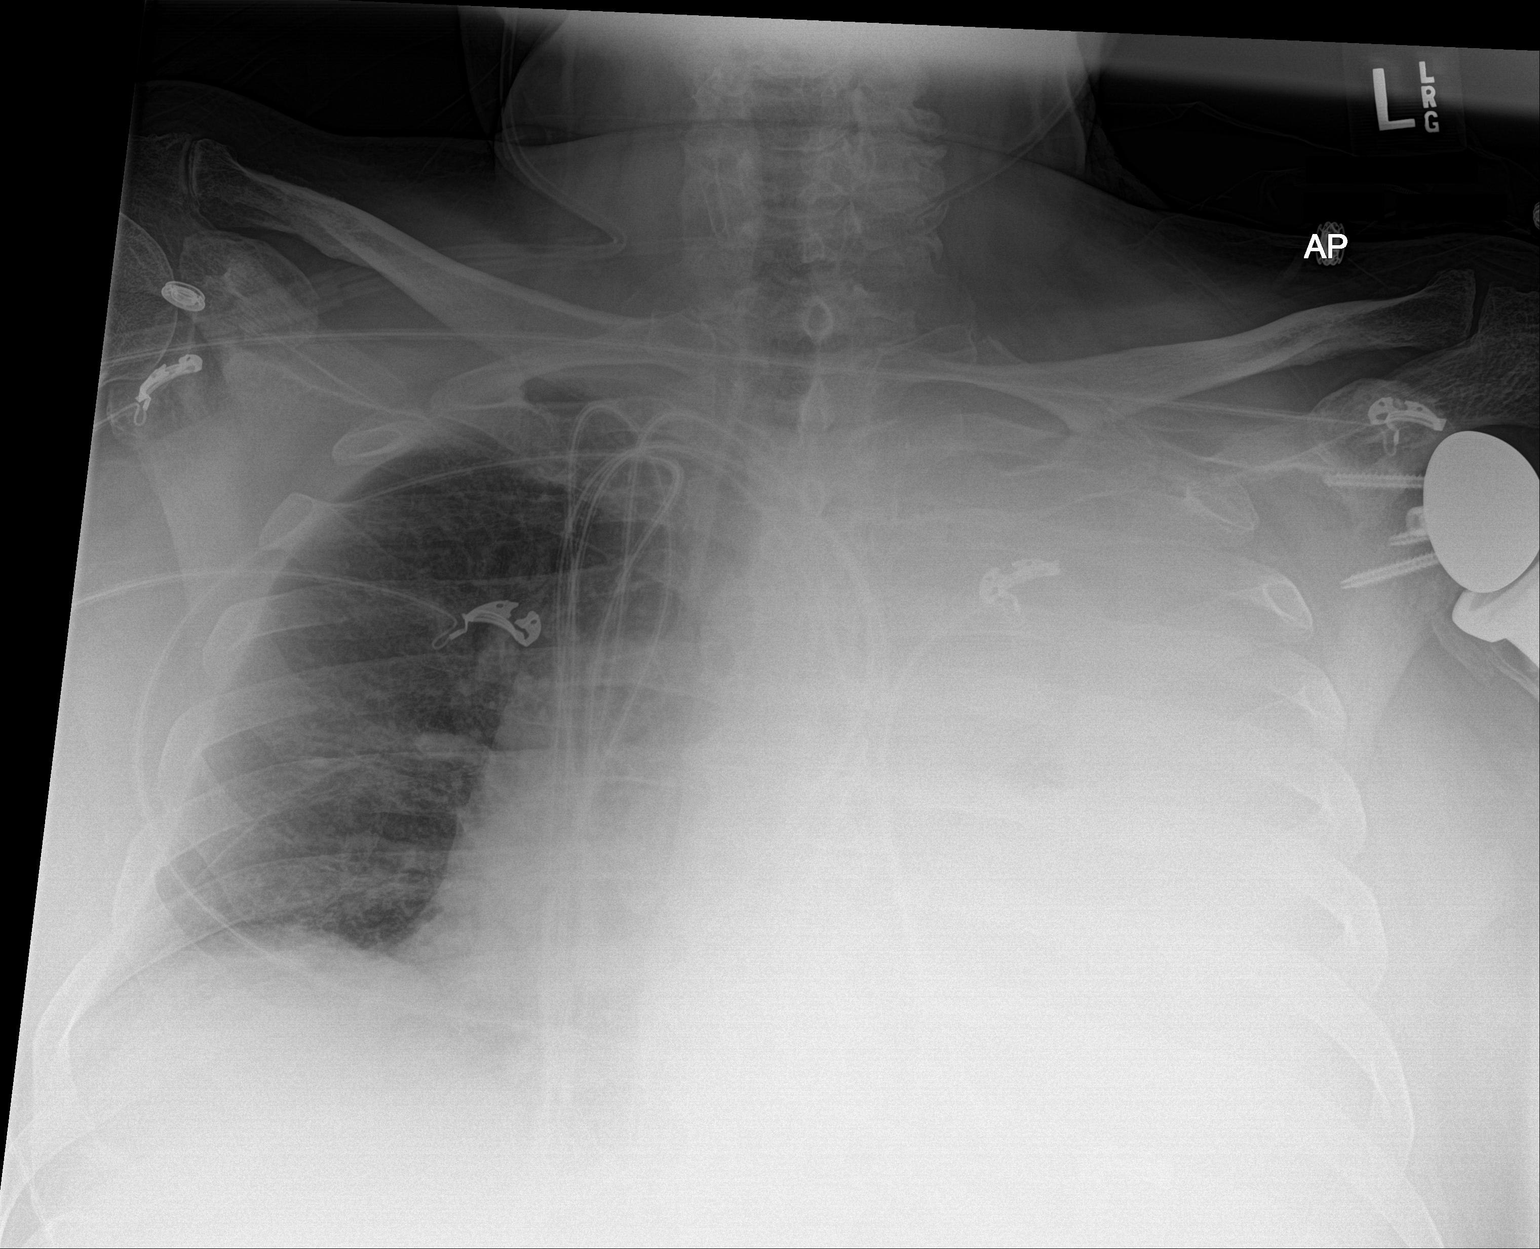

[1 of 1 positions shown; findings below may reference images not displayed]

FINDINGS: There is now near complete opacification of the left hemithorax with
slight mediastinal shift to the right. This likely suggests a large
right pleural effusion. Heart size is difficult to assess. The right
lung is clear. Reverse left shoulder arthroplasty is noted.
IMPRESSION: 1. Near complete opacification of the left hemithorax with some
mediastinal shift to the right indicating a large left pleural
effusion.
2. The right lung is grossly clear.

## 2018-11-14 IMAGING — DX DG CHEST 1V PORT
1 series · 1 of 1 positions shown · non-contrast
Comparison: 11/20/2017

CLINICAL DATA: Evaluate pleural effusion

EXAM:
PORTABLE CHEST 1 VIEW

[chest ap]
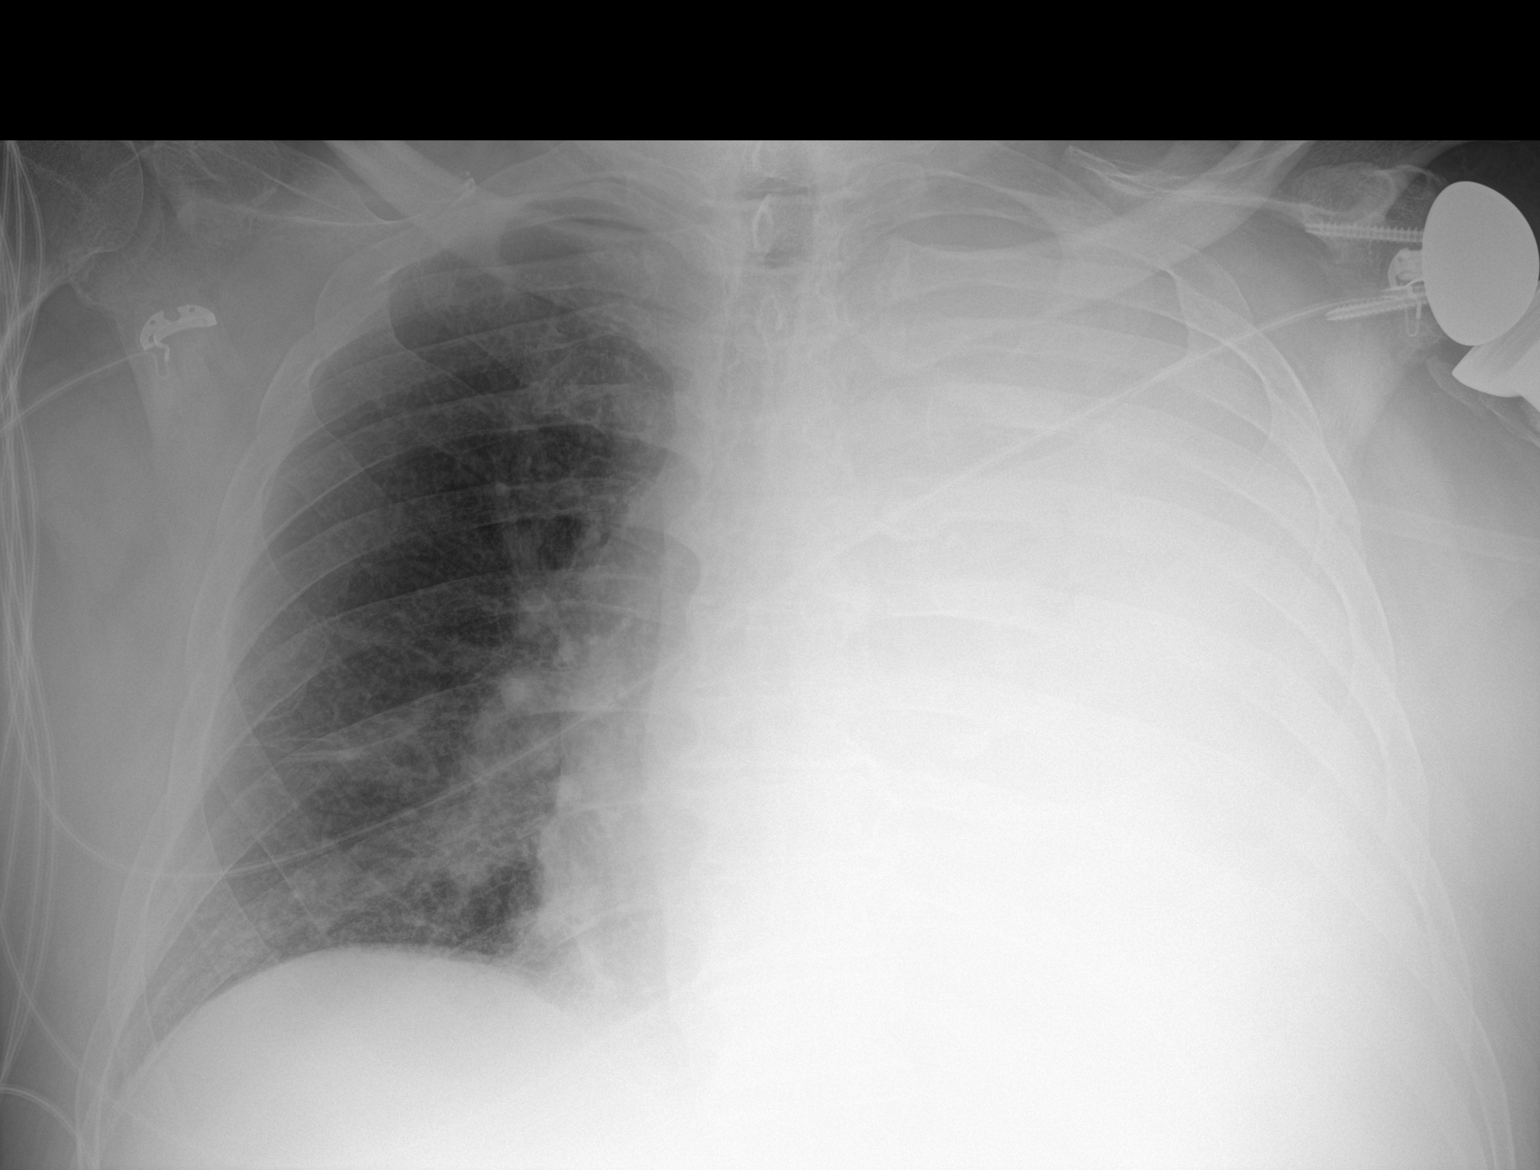

[1 of 1 positions shown; findings below may reference images not displayed]

FINDINGS: Complete whiteout of the left hemithorax again noted, stable. No
confluent opacity on the right. Improved lung volumes on the right.
Right hilar fullness is likely vascular when compared to prior chest
CT.
IMPRESSION: Continued complete opacification of the left hemithorax.
